# Patient Record
Sex: Female | Born: 1945 | Race: Black or African American | Hispanic: No | Marital: Married | Smoking: Current every day smoker
Health system: Southern US, Community
[De-identification: ages and names within clinical notes are randomized; demographics above are authoritative.]

## PROBLEM LIST (undated history)

## (undated) ENCOUNTER — Ambulatory Visit (HOSPITAL_COMMUNITY): Admission: EM | Payer: Medicare Other

## (undated) DIAGNOSIS — F419 Anxiety disorder, unspecified: Secondary | ICD-10-CM

## (undated) DIAGNOSIS — IMO0001 Reserved for inherently not codable concepts without codable children: Secondary | ICD-10-CM

## (undated) DIAGNOSIS — E119 Type 2 diabetes mellitus without complications: Secondary | ICD-10-CM

## (undated) DIAGNOSIS — R51 Headache: Secondary | ICD-10-CM

## (undated) DIAGNOSIS — K449 Diaphragmatic hernia without obstruction or gangrene: Secondary | ICD-10-CM

## (undated) DIAGNOSIS — Z5189 Encounter for other specified aftercare: Secondary | ICD-10-CM

## (undated) DIAGNOSIS — D649 Anemia, unspecified: Secondary | ICD-10-CM

## (undated) DIAGNOSIS — R519 Headache, unspecified: Secondary | ICD-10-CM

## (undated) DIAGNOSIS — R079 Chest pain, unspecified: Secondary | ICD-10-CM

## (undated) DIAGNOSIS — I2510 Atherosclerotic heart disease of native coronary artery without angina pectoris: Secondary | ICD-10-CM

## (undated) DIAGNOSIS — E7800 Pure hypercholesterolemia, unspecified: Secondary | ICD-10-CM

## (undated) DIAGNOSIS — I214 Non-ST elevation (NSTEMI) myocardial infarction: Secondary | ICD-10-CM

## (undated) DIAGNOSIS — I10 Essential (primary) hypertension: Secondary | ICD-10-CM

## (undated) DIAGNOSIS — M1990 Unspecified osteoarthritis, unspecified site: Secondary | ICD-10-CM

## (undated) DIAGNOSIS — I472 Ventricular tachycardia: Secondary | ICD-10-CM

## (undated) DIAGNOSIS — I201 Angina pectoris with documented spasm: Secondary | ICD-10-CM

## (undated) DIAGNOSIS — K219 Gastro-esophageal reflux disease without esophagitis: Secondary | ICD-10-CM

## (undated) DIAGNOSIS — I209 Angina pectoris, unspecified: Secondary | ICD-10-CM

## (undated) DIAGNOSIS — F410 Panic disorder [episodic paroxysmal anxiety] without agoraphobia: Secondary | ICD-10-CM

## (undated) HISTORY — PX: KNEE SURGERY: SHX244

## (undated) HISTORY — PX: CORONARY ANGIOPLASTY: SHX604

## (undated) HISTORY — PX: ABDOMINAL HYSTERECTOMY: SHX81

## (undated) HISTORY — PX: TOE SURGERY: SHX1073

## (undated) HISTORY — PX: BREAST EXCISIONAL BIOPSY: SUR124

## (undated) HISTORY — PX: OTHER SURGICAL HISTORY: SHX169

## (undated) HISTORY — PX: SHOULDER SURGERY: SHX246

---

## 1998-11-22 ENCOUNTER — Encounter: Payer: Self-pay | Admitting: Emergency Medicine

## 1998-11-22 ENCOUNTER — Inpatient Hospital Stay (HOSPITAL_COMMUNITY): Admission: EM | Admit: 1998-11-22 | Discharge: 1998-11-27 | Payer: Self-pay | Admitting: Emergency Medicine

## 1998-12-06 ENCOUNTER — Observation Stay (HOSPITAL_COMMUNITY): Admission: AD | Admit: 1998-12-06 | Discharge: 1998-12-07 | Payer: Self-pay | Admitting: Interventional Cardiology

## 1998-12-30 ENCOUNTER — Inpatient Hospital Stay (HOSPITAL_COMMUNITY): Admission: EM | Admit: 1998-12-30 | Discharge: 1999-01-02 | Payer: Self-pay | Admitting: Emergency Medicine

## 1998-12-30 ENCOUNTER — Encounter: Payer: Self-pay | Admitting: *Deleted

## 1999-04-03 ENCOUNTER — Inpatient Hospital Stay (HOSPITAL_COMMUNITY): Admission: EM | Admit: 1999-04-03 | Discharge: 1999-04-04 | Payer: Self-pay | Admitting: Emergency Medicine

## 1999-04-03 ENCOUNTER — Encounter: Payer: Self-pay | Admitting: Emergency Medicine

## 1999-05-28 ENCOUNTER — Encounter: Payer: Self-pay | Admitting: Cardiology

## 1999-05-28 ENCOUNTER — Inpatient Hospital Stay (HOSPITAL_COMMUNITY): Admission: EM | Admit: 1999-05-28 | Discharge: 1999-05-29 | Payer: Self-pay | Admitting: Emergency Medicine

## 1999-09-02 ENCOUNTER — Inpatient Hospital Stay (HOSPITAL_COMMUNITY): Admission: EM | Admit: 1999-09-02 | Discharge: 1999-09-04 | Payer: Self-pay | Admitting: Emergency Medicine

## 1999-09-03 ENCOUNTER — Encounter: Payer: Self-pay | Admitting: *Deleted

## 1999-09-24 ENCOUNTER — Ambulatory Visit (HOSPITAL_COMMUNITY): Admission: RE | Admit: 1999-09-24 | Discharge: 1999-09-24 | Payer: Self-pay | Admitting: Gastroenterology

## 2000-01-20 ENCOUNTER — Observation Stay (HOSPITAL_COMMUNITY): Admission: EM | Admit: 2000-01-20 | Discharge: 2000-01-21 | Payer: Self-pay | Admitting: Emergency Medicine

## 2000-01-27 ENCOUNTER — Encounter: Payer: Self-pay | Admitting: *Deleted

## 2000-01-27 ENCOUNTER — Encounter: Admission: RE | Admit: 2000-01-27 | Discharge: 2000-01-27 | Payer: Self-pay | Admitting: *Deleted

## 2000-06-23 ENCOUNTER — Encounter: Payer: Self-pay | Admitting: Emergency Medicine

## 2000-06-23 ENCOUNTER — Inpatient Hospital Stay (HOSPITAL_COMMUNITY): Admission: EM | Admit: 2000-06-23 | Discharge: 2000-06-24 | Payer: Self-pay | Admitting: Emergency Medicine

## 2000-09-17 ENCOUNTER — Ambulatory Visit (HOSPITAL_COMMUNITY): Admission: RE | Admit: 2000-09-17 | Discharge: 2000-09-17 | Payer: Self-pay | Admitting: Gastroenterology

## 2000-09-17 ENCOUNTER — Encounter: Payer: Self-pay | Admitting: Gastroenterology

## 2000-10-01 ENCOUNTER — Encounter: Admission: RE | Admit: 2000-10-01 | Discharge: 2000-10-01 | Payer: Self-pay | Admitting: Internal Medicine

## 2000-10-01 ENCOUNTER — Encounter: Payer: Self-pay | Admitting: Internal Medicine

## 2000-12-11 ENCOUNTER — Ambulatory Visit (HOSPITAL_COMMUNITY): Admission: RE | Admit: 2000-12-11 | Discharge: 2000-12-11 | Payer: Self-pay | Admitting: Gastroenterology

## 2000-12-11 ENCOUNTER — Encounter: Payer: Self-pay | Admitting: Gastroenterology

## 2000-12-15 ENCOUNTER — Encounter: Payer: Self-pay | Admitting: Emergency Medicine

## 2000-12-15 ENCOUNTER — Emergency Department (HOSPITAL_COMMUNITY): Admission: EM | Admit: 2000-12-15 | Discharge: 2000-12-15 | Payer: Self-pay | Admitting: Emergency Medicine

## 2001-01-16 ENCOUNTER — Encounter: Payer: Self-pay | Admitting: Emergency Medicine

## 2001-01-16 ENCOUNTER — Emergency Department (HOSPITAL_COMMUNITY): Admission: EM | Admit: 2001-01-16 | Discharge: 2001-01-16 | Payer: Self-pay | Admitting: Emergency Medicine

## 2001-02-01 ENCOUNTER — Ambulatory Visit (HOSPITAL_COMMUNITY): Admission: RE | Admit: 2001-02-01 | Discharge: 2001-02-01 | Payer: Self-pay | Admitting: Gastroenterology

## 2001-02-03 ENCOUNTER — Inpatient Hospital Stay (HOSPITAL_COMMUNITY): Admission: EM | Admit: 2001-02-03 | Discharge: 2001-02-04 | Payer: Self-pay | Admitting: Emergency Medicine

## 2001-02-03 ENCOUNTER — Encounter: Payer: Self-pay | Admitting: Emergency Medicine

## 2001-02-15 ENCOUNTER — Encounter: Payer: Self-pay | Admitting: Internal Medicine

## 2001-02-15 ENCOUNTER — Encounter: Admission: RE | Admit: 2001-02-15 | Discharge: 2001-02-15 | Payer: Self-pay | Admitting: Internal Medicine

## 2001-04-04 ENCOUNTER — Encounter: Payer: Self-pay | Admitting: Emergency Medicine

## 2001-04-04 ENCOUNTER — Emergency Department (HOSPITAL_COMMUNITY): Admission: EM | Admit: 2001-04-04 | Discharge: 2001-04-04 | Payer: Self-pay | Admitting: Emergency Medicine

## 2001-07-10 ENCOUNTER — Encounter: Payer: Self-pay | Admitting: Orthopedic Surgery

## 2001-07-10 ENCOUNTER — Encounter: Admission: RE | Admit: 2001-07-10 | Discharge: 2001-07-10 | Payer: Self-pay | Admitting: Orthopedic Surgery

## 2002-02-21 ENCOUNTER — Encounter: Payer: Self-pay | Admitting: *Deleted

## 2002-02-21 ENCOUNTER — Emergency Department (HOSPITAL_COMMUNITY): Admission: EM | Admit: 2002-02-21 | Discharge: 2002-02-21 | Payer: Self-pay | Admitting: *Deleted

## 2002-03-01 ENCOUNTER — Encounter: Payer: Self-pay | Admitting: Internal Medicine

## 2002-03-01 ENCOUNTER — Encounter: Admission: RE | Admit: 2002-03-01 | Discharge: 2002-03-01 | Payer: Self-pay | Admitting: Internal Medicine

## 2002-07-02 ENCOUNTER — Inpatient Hospital Stay (HOSPITAL_COMMUNITY): Admission: EM | Admit: 2002-07-02 | Discharge: 2002-07-04 | Payer: Self-pay | Admitting: Emergency Medicine

## 2002-07-02 ENCOUNTER — Encounter: Payer: Self-pay | Admitting: Emergency Medicine

## 2002-11-02 ENCOUNTER — Encounter: Payer: Self-pay | Admitting: Emergency Medicine

## 2002-11-02 ENCOUNTER — Observation Stay (HOSPITAL_COMMUNITY): Admission: EM | Admit: 2002-11-02 | Discharge: 2002-11-03 | Payer: Self-pay | Admitting: Emergency Medicine

## 2002-11-11 ENCOUNTER — Encounter: Payer: Self-pay | Admitting: Emergency Medicine

## 2002-11-11 ENCOUNTER — Emergency Department (HOSPITAL_COMMUNITY): Admission: EM | Admit: 2002-11-11 | Discharge: 2002-11-11 | Payer: Self-pay | Admitting: Emergency Medicine

## 2003-01-11 ENCOUNTER — Ambulatory Visit (HOSPITAL_BASED_OUTPATIENT_CLINIC_OR_DEPARTMENT_OTHER): Admission: RE | Admit: 2003-01-11 | Discharge: 2003-01-11 | Payer: Self-pay | Admitting: Orthopedic Surgery

## 2003-02-08 ENCOUNTER — Ambulatory Visit (HOSPITAL_BASED_OUTPATIENT_CLINIC_OR_DEPARTMENT_OTHER): Admission: RE | Admit: 2003-02-08 | Discharge: 2003-02-08 | Payer: Self-pay | Admitting: Orthopedic Surgery

## 2003-02-19 ENCOUNTER — Emergency Department (HOSPITAL_COMMUNITY): Admission: EM | Admit: 2003-02-19 | Discharge: 2003-02-19 | Payer: Self-pay | Admitting: Emergency Medicine

## 2003-02-19 ENCOUNTER — Encounter: Payer: Self-pay | Admitting: Emergency Medicine

## 2003-02-20 ENCOUNTER — Emergency Department (HOSPITAL_COMMUNITY): Admission: EM | Admit: 2003-02-20 | Discharge: 2003-02-20 | Payer: Self-pay | Admitting: Emergency Medicine

## 2003-03-03 ENCOUNTER — Emergency Department (HOSPITAL_COMMUNITY): Admission: EM | Admit: 2003-03-03 | Discharge: 2003-03-03 | Payer: Self-pay

## 2003-03-16 ENCOUNTER — Inpatient Hospital Stay (HOSPITAL_COMMUNITY): Admission: EM | Admit: 2003-03-16 | Discharge: 2003-03-18 | Payer: Self-pay | Admitting: Emergency Medicine

## 2003-03-16 ENCOUNTER — Encounter: Payer: Self-pay | Admitting: Interventional Cardiology

## 2003-04-05 ENCOUNTER — Encounter: Payer: Self-pay | Admitting: Internal Medicine

## 2003-04-05 ENCOUNTER — Ambulatory Visit (HOSPITAL_COMMUNITY): Admission: RE | Admit: 2003-04-05 | Discharge: 2003-04-05 | Payer: Self-pay | Admitting: Internal Medicine

## 2003-09-19 ENCOUNTER — Encounter: Admission: RE | Admit: 2003-09-19 | Discharge: 2003-09-19 | Payer: Self-pay | Admitting: Internal Medicine

## 2003-10-09 ENCOUNTER — Inpatient Hospital Stay (HOSPITAL_COMMUNITY): Admission: RE | Admit: 2003-10-09 | Discharge: 2003-10-10 | Payer: Self-pay | Admitting: Neurosurgery

## 2004-01-21 ENCOUNTER — Inpatient Hospital Stay (HOSPITAL_COMMUNITY): Admission: EM | Admit: 2004-01-21 | Discharge: 2004-01-24 | Payer: Self-pay | Admitting: Emergency Medicine

## 2004-01-23 ENCOUNTER — Encounter (INDEPENDENT_AMBULATORY_CARE_PROVIDER_SITE_OTHER): Payer: Self-pay | Admitting: Cardiology

## 2004-04-01 ENCOUNTER — Other Ambulatory Visit: Admission: RE | Admit: 2004-04-01 | Discharge: 2004-04-01 | Payer: Self-pay | Admitting: Internal Medicine

## 2004-06-18 ENCOUNTER — Ambulatory Visit (HOSPITAL_COMMUNITY): Admission: RE | Admit: 2004-06-18 | Discharge: 2004-06-18 | Payer: Self-pay | Admitting: Gastroenterology

## 2004-08-10 ENCOUNTER — Emergency Department (HOSPITAL_COMMUNITY): Admission: EM | Admit: 2004-08-10 | Discharge: 2004-08-10 | Payer: Self-pay | Admitting: Emergency Medicine

## 2004-11-14 ENCOUNTER — Encounter: Admission: RE | Admit: 2004-11-14 | Discharge: 2004-11-14 | Payer: Self-pay | Admitting: Internal Medicine

## 2004-12-30 ENCOUNTER — Emergency Department (HOSPITAL_COMMUNITY): Admission: EM | Admit: 2004-12-30 | Discharge: 2004-12-30 | Payer: Self-pay | Admitting: Family Medicine

## 2005-05-24 ENCOUNTER — Emergency Department (HOSPITAL_COMMUNITY): Admission: EM | Admit: 2005-05-24 | Discharge: 2005-05-24 | Payer: Self-pay | Admitting: Emergency Medicine

## 2006-02-18 ENCOUNTER — Inpatient Hospital Stay (HOSPITAL_COMMUNITY): Admission: EM | Admit: 2006-02-18 | Discharge: 2006-02-19 | Payer: Self-pay | Admitting: Emergency Medicine

## 2006-03-19 ENCOUNTER — Encounter: Admission: RE | Admit: 2006-03-19 | Discharge: 2006-03-19 | Payer: Self-pay | Admitting: Internal Medicine

## 2006-03-31 ENCOUNTER — Other Ambulatory Visit: Admission: RE | Admit: 2006-03-31 | Discharge: 2006-03-31 | Payer: Self-pay | Admitting: Internal Medicine

## 2006-11-28 ENCOUNTER — Inpatient Hospital Stay (HOSPITAL_COMMUNITY): Admission: EM | Admit: 2006-11-28 | Discharge: 2006-11-30 | Payer: Self-pay | Admitting: Emergency Medicine

## 2007-03-14 ENCOUNTER — Inpatient Hospital Stay (HOSPITAL_COMMUNITY): Admission: EM | Admit: 2007-03-14 | Discharge: 2007-03-16 | Payer: Self-pay | Admitting: Emergency Medicine

## 2007-04-01 ENCOUNTER — Encounter (HOSPITAL_COMMUNITY): Admission: RE | Admit: 2007-04-01 | Discharge: 2007-06-30 | Payer: Self-pay | Admitting: Interventional Cardiology

## 2007-07-01 ENCOUNTER — Encounter (HOSPITAL_COMMUNITY): Admission: RE | Admit: 2007-07-01 | Discharge: 2007-08-11 | Payer: Self-pay | Admitting: Interventional Cardiology

## 2007-07-02 ENCOUNTER — Encounter: Admission: RE | Admit: 2007-07-02 | Discharge: 2007-07-02 | Payer: Self-pay | Admitting: Internal Medicine

## 2007-08-29 ENCOUNTER — Inpatient Hospital Stay (HOSPITAL_COMMUNITY): Admission: EM | Admit: 2007-08-29 | Discharge: 2007-08-31 | Payer: Self-pay | Admitting: Emergency Medicine

## 2007-10-13 ENCOUNTER — Ambulatory Visit (HOSPITAL_COMMUNITY): Admission: RE | Admit: 2007-10-13 | Discharge: 2007-10-13 | Payer: Self-pay | Admitting: Gastroenterology

## 2007-10-13 ENCOUNTER — Encounter (INDEPENDENT_AMBULATORY_CARE_PROVIDER_SITE_OTHER): Payer: Self-pay | Admitting: Gastroenterology

## 2008-01-03 ENCOUNTER — Emergency Department (HOSPITAL_COMMUNITY): Admission: EM | Admit: 2008-01-03 | Discharge: 2008-01-03 | Payer: Self-pay | Admitting: Emergency Medicine

## 2008-11-18 ENCOUNTER — Inpatient Hospital Stay (HOSPITAL_COMMUNITY): Admission: EM | Admit: 2008-11-18 | Discharge: 2008-11-20 | Payer: Self-pay | Admitting: Emergency Medicine

## 2008-11-18 ENCOUNTER — Ambulatory Visit: Payer: Self-pay | Admitting: *Deleted

## 2008-11-20 ENCOUNTER — Encounter (INDEPENDENT_AMBULATORY_CARE_PROVIDER_SITE_OTHER): Payer: Self-pay | Admitting: Gastroenterology

## 2009-04-18 ENCOUNTER — Observation Stay (HOSPITAL_COMMUNITY): Admission: EM | Admit: 2009-04-18 | Discharge: 2009-04-19 | Payer: Self-pay | Admitting: Emergency Medicine

## 2009-04-25 ENCOUNTER — Encounter: Admission: RE | Admit: 2009-04-25 | Discharge: 2009-04-25 | Payer: Self-pay | Admitting: Internal Medicine

## 2009-07-24 ENCOUNTER — Ambulatory Visit: Payer: Self-pay | Admitting: Cardiology

## 2009-07-24 ENCOUNTER — Inpatient Hospital Stay (HOSPITAL_COMMUNITY): Admission: EM | Admit: 2009-07-24 | Discharge: 2009-07-29 | Payer: Self-pay | Admitting: Emergency Medicine

## 2009-12-06 ENCOUNTER — Emergency Department (HOSPITAL_COMMUNITY): Admission: EM | Admit: 2009-12-06 | Discharge: 2009-12-06 | Payer: Self-pay | Admitting: Emergency Medicine

## 2010-01-23 ENCOUNTER — Inpatient Hospital Stay (HOSPITAL_COMMUNITY): Admission: EM | Admit: 2010-01-23 | Discharge: 2010-01-23 | Payer: Self-pay

## 2010-06-11 ENCOUNTER — Encounter: Admission: RE | Admit: 2010-06-11 | Discharge: 2010-06-11 | Payer: Self-pay | Admitting: Internal Medicine

## 2010-06-17 ENCOUNTER — Encounter: Admission: RE | Admit: 2010-06-17 | Discharge: 2010-06-17 | Payer: Self-pay | Admitting: Internal Medicine

## 2010-06-20 ENCOUNTER — Other Ambulatory Visit: Admission: RE | Admit: 2010-06-20 | Discharge: 2010-06-20 | Payer: Self-pay | Admitting: Internal Medicine

## 2010-06-20 ENCOUNTER — Other Ambulatory Visit
Admission: RE | Admit: 2010-06-20 | Discharge: 2010-06-20 | Payer: Self-pay | Source: Home / Self Care | Admitting: Internal Medicine

## 2010-08-11 HISTORY — PX: OTHER SURGICAL HISTORY: SHX169

## 2010-08-11 HISTORY — PX: CARDIAC CATHETERIZATION: SHX172

## 2010-08-14 ENCOUNTER — Observation Stay (HOSPITAL_COMMUNITY)
Admission: EM | Admit: 2010-08-14 | Discharge: 2010-08-16 | Payer: Self-pay | Source: Home / Self Care | Attending: Interventional Cardiology | Admitting: Interventional Cardiology

## 2010-08-14 LAB — ETHANOL: Alcohol, Ethyl (B): <5 mg/dL (ref 0–10)

## 2010-08-14 LAB — COMPREHENSIVE METABOLIC PANEL
ALT: 13 U/L (ref 0–35)
AST: 16 U/L (ref 0–37)
Albumin: 3.2 g/dL — ABNORMAL LOW (ref 3.5–5.2)
Alkaline Phosphatase: 81 U/L (ref 39–117)
BUN: 7 mg/dL (ref 6–23)
CO2: 28 mEq/L (ref 19–32)
Calcium: 8.8 mg/dL (ref 8.4–10.5)
Chloride: 110 mEq/L (ref 96–112)
Creatinine, Ser: 0.84 mg/dL (ref 0.4–1.2)
GFR calc Af Amer: >60 mL/min (ref >60)
GFR calc non Af Amer: >60 mL/min (ref >60)
Glucose, Bld: 100 mg/dL — ABNORMAL HIGH (ref 70–99)
Potassium: 4.1 mEq/L (ref 3.5–5.1)
Sodium: 144 mEq/L (ref 135–145)
Total Bilirubin: 0.4 mg/dL (ref 0.3–1.2)
Total Protein: 5.8 g/dL — ABNORMAL LOW (ref 6.0–8.3)

## 2010-08-14 LAB — POCT I-STAT, CHEM 8
BUN: 6 mg/dL (ref 6–23)
Calcium, Ion: 1.1 mmol/L — ABNORMAL LOW (ref 1.12–1.32)
Chloride: 106 mEq/L (ref 96–112)
Creatinine, Ser: 1 mg/dL (ref 0.4–1.2)
Glucose, Bld: 103 mg/dL — ABNORMAL HIGH (ref 70–99)
HCT: 40 % (ref 36.0–46.0)
Hemoglobin: 13.6 g/dL (ref 12.0–15.0)
Potassium: 4.1 mEq/L (ref 3.5–5.1)
Sodium: 141 mEq/L (ref 135–145)
TCO2: 29 mmol/L (ref 0–100)

## 2010-08-14 LAB — CBC
HCT: 38.6 % (ref 36.0–46.0)
Hemoglobin: 13 g/dL (ref 12.0–15.0)
MCH: 32.3 pg (ref 26.0–34.0)
MCHC: 33.7 g/dL (ref 30.0–36.0)
MCV: 96 fL (ref 78.0–100.0)
Platelets: 254 10*3/uL (ref 150–400)
RBC: 4.02 MIL/uL (ref 3.87–5.11)
RDW: 14 % (ref 11.5–15.5)
WBC: 5.9 10*3/uL (ref 4.0–10.5)

## 2010-08-14 LAB — POCT CARDIAC MARKERS
CKMB, poc: <1 ng/mL — ABNORMAL LOW (ref 1.0–8.0)
Myoglobin, poc: 40.6 ng/mL (ref 12–200)
Troponin i, poc: 0.13 ng/mL — ABNORMAL HIGH (ref 0.00–0.09)

## 2010-08-14 LAB — APTT: aPTT: 35 seconds (ref 24–37)

## 2010-08-14 LAB — CK TOTAL AND CKMB (NOT AT ARMC)
CK, MB: 1.7 ng/mL (ref 0.3–4.0)
Relative Index: INVALID (ref 0.0–2.5)
Total CK: 91 U/L (ref 7–177)

## 2010-08-14 LAB — PROTIME-INR
INR: 0.92 (ref 0.00–1.49)
Prothrombin Time: 12.6 seconds (ref 11.6–15.2)

## 2010-08-14 LAB — TROPONIN I: Troponin I: 0.02 ng/mL (ref 0.00–0.06)

## 2010-08-14 LAB — LIPASE, BLOOD: Lipase: 20 U/L (ref 11–59)

## 2010-08-15 LAB — LIPID PANEL
Cholesterol: 120 mg/dL (ref 0–200)
HDL: 67 mg/dL (ref >39)
LDL Cholesterol: 41 mg/dL (ref 0–99)
Total CHOL/HDL Ratio: 1.8 RATIO
Triglycerides: 61 mg/dL (ref <150)
VLDL: 12 mg/dL (ref 0–40)

## 2010-08-15 LAB — CARDIAC PANEL(CRET KIN+CKTOT+MB+TROPI)
CK, MB: 1.5 ng/mL (ref 0.3–4.0)
CK, MB: 1.5 ng/mL (ref 0.3–4.0)
Relative Index: INVALID (ref 0.0–2.5)
Relative Index: INVALID (ref 0.0–2.5)
Total CK: 73 U/L (ref 7–177)
Total CK: 71 U/L (ref 7–177)
Troponin I: 0.02 ng/mL (ref 0.00–0.06)
Troponin I: 0.01 ng/mL (ref 0.00–0.06)

## 2010-08-15 LAB — CBC
HCT: 35.4 % — ABNORMAL LOW (ref 36.0–46.0)
Hemoglobin: 11.8 g/dL — ABNORMAL LOW (ref 12.0–15.0)
MCH: 32.4 pg (ref 26.0–34.0)
MCHC: 33.3 g/dL (ref 30.0–36.0)
MCV: 97.3 fL (ref 78.0–100.0)
Platelets: 235 10*3/uL (ref 150–400)
RBC: 3.64 MIL/uL — ABNORMAL LOW (ref 3.87–5.11)
RDW: 14 % (ref 11.5–15.5)
WBC: 5.5 10*3/uL (ref 4.0–10.5)

## 2010-08-15 LAB — HEPATIC FUNCTION PANEL
ALT: 12 U/L (ref 0–35)
AST: 18 U/L (ref 0–37)
Albumin: 3 g/dL — ABNORMAL LOW (ref 3.5–5.2)
Alkaline Phosphatase: 67 U/L (ref 39–117)
Bilirubin, Direct: 0.1 mg/dL (ref 0.0–0.3)
Indirect Bilirubin: 0.1 mg/dL — ABNORMAL LOW (ref 0.3–0.9)
Total Bilirubin: 0.2 mg/dL — ABNORMAL LOW (ref 0.3–1.2)
Total Protein: 5.1 g/dL — ABNORMAL LOW (ref 6.0–8.3)

## 2010-08-15 LAB — DIFFERENTIAL
Basophils Absolute: 0 10*3/uL (ref 0.0–0.1)
Basophils Relative: 0 % (ref 0–1)
Eosinophils Absolute: 0.1 10*3/uL (ref 0.0–0.7)
Eosinophils Relative: 2 % (ref 0–5)
Lymphocytes Relative: 38 % (ref 12–46)
Lymphs Abs: 2.3 10*3/uL (ref 0.7–4.0)
Monocytes Absolute: 0.3 10*3/uL (ref 0.1–1.0)
Monocytes Relative: 5 % (ref 3–12)
Neutro Abs: 3.2 10*3/uL (ref 1.7–7.7)
Neutrophils Relative %: 54 % (ref 43–77)

## 2010-08-15 LAB — AMYLASE: Amylase: 63 U/L (ref 0–105)

## 2010-08-15 LAB — LIPASE, BLOOD: Lipase: 38 U/L (ref 11–59)

## 2010-08-19 ENCOUNTER — Ambulatory Visit (HOSPITAL_COMMUNITY)
Admission: RE | Admit: 2010-08-19 | Discharge: 2010-08-19 | Payer: Self-pay | Source: Home / Self Care | Attending: Gastroenterology | Admitting: Gastroenterology

## 2010-08-31 ENCOUNTER — Encounter: Payer: Self-pay | Admitting: Internal Medicine

## 2010-09-01 ENCOUNTER — Encounter: Payer: Self-pay | Admitting: Interventional Cardiology

## 2010-09-06 ENCOUNTER — Other Ambulatory Visit: Payer: Self-pay | Admitting: Gastroenterology

## 2010-10-28 LAB — DIFFERENTIAL
Basophils Absolute: 0 10*3/uL (ref 0.0–0.1)
Basophils Relative: 0 % (ref 0–1)
Eosinophils Absolute: 0.1 10*3/uL (ref 0.0–0.7)
Eosinophils Relative: 1 % (ref 0–5)
Lymphocytes Relative: 33 % (ref 12–46)
Lymphs Abs: 3.1 10*3/uL (ref 0.7–4.0)
Monocytes Absolute: 0.4 10*3/uL (ref 0.1–1.0)
Monocytes Relative: 5 % (ref 3–12)
Neutro Abs: 5.7 10*3/uL (ref 1.7–7.7)
Neutrophils Relative %: 62 % (ref 43–77)

## 2010-10-28 LAB — APTT: aPTT: 32 seconds (ref 24–37)

## 2010-10-28 LAB — POCT I-STAT 3, VENOUS BLOOD GAS (G3P V)
Acid-Base Excess: 1 mmol/L (ref 0.0–2.0)
Bicarbonate: 24.6 mEq/L — ABNORMAL HIGH (ref 20.0–24.0)
O2 Saturation: 99 %
TCO2: 26 mmol/L (ref 0–100)
pCO2, Ven: 37.3 mmHg — ABNORMAL LOW (ref 45.0–50.0)
pH, Ven: 7.428 — ABNORMAL HIGH (ref 7.250–7.300)
pO2, Ven: 125 mmHg — ABNORMAL HIGH (ref 30.0–45.0)

## 2010-10-28 LAB — POCT I-STAT 3, ART BLOOD GAS (G3+)
Acid-base deficit: 4 mmol/L — ABNORMAL HIGH (ref 0.0–2.0)
Bicarbonate: 22.9 mEq/L (ref 20.0–24.0)
O2 Saturation: 100 %
TCO2: 24 mmol/L (ref 0–100)
pCO2 arterial: 48 mmHg — ABNORMAL HIGH (ref 35.0–45.0)
pH, Arterial: 7.286 — ABNORMAL LOW (ref 7.350–7.400)
pO2, Arterial: 520 mmHg — ABNORMAL HIGH (ref 80.0–100.0)

## 2010-10-28 LAB — URINALYSIS, ROUTINE W REFLEX MICROSCOPIC
Bilirubin Urine: NEGATIVE
Glucose, UA: NEGATIVE mg/dL
Hgb urine dipstick: NEGATIVE
Ketones, ur: NEGATIVE mg/dL
Nitrite: NEGATIVE
Protein, ur: NEGATIVE mg/dL
Specific Gravity, Urine: 1.01 (ref 1.005–1.030)
Urobilinogen, UA: 1 mg/dL (ref 0.0–1.0)
pH: 5 (ref 5.0–8.0)

## 2010-10-28 LAB — BRAIN NATRIURETIC PEPTIDE: Pro B Natriuretic peptide (BNP): 39 pg/mL (ref 0.0–100.0)

## 2010-10-28 LAB — COMPREHENSIVE METABOLIC PANEL
ALT: 23 U/L (ref 0–35)
AST: 26 U/L (ref 0–37)
Albumin: 4.2 g/dL (ref 3.5–5.2)
Alkaline Phosphatase: 122 U/L — ABNORMAL HIGH (ref 39–117)
BUN: 15 mg/dL (ref 6–23)
CO2: 23 mEq/L (ref 19–32)
Calcium: 10 mg/dL (ref 8.4–10.5)
Chloride: 106 mEq/L (ref 96–112)
Creatinine, Ser: 0.81 mg/dL (ref 0.4–1.2)
GFR calc Af Amer: >60 mL/min (ref >60)
GFR calc non Af Amer: >60 mL/min (ref >60)
Glucose, Bld: 106 mg/dL — ABNORMAL HIGH (ref 70–99)
Potassium: 3.7 mEq/L (ref 3.5–5.1)
Sodium: 142 mEq/L (ref 135–145)
Total Bilirubin: 0.2 mg/dL — ABNORMAL LOW (ref 0.3–1.2)
Total Protein: 7.5 g/dL (ref 6.0–8.3)

## 2010-10-28 LAB — URINE CULTURE
Colony Count: NO GROWTH
Culture: NO GROWTH

## 2010-10-28 LAB — RAPID URINE DRUG SCREEN, HOSP PERFORMED
Amphetamines: NOT DETECTED
Barbiturates: NOT DETECTED
Benzodiazepines: NOT DETECTED
Cocaine: NOT DETECTED
Opiates: NOT DETECTED
Tetrahydrocannabinol: NOT DETECTED

## 2010-10-28 LAB — CBC
HCT: 42.6 % (ref 36.0–46.0)
Hemoglobin: 14.2 g/dL (ref 12.0–15.0)
MCHC: 33.3 g/dL (ref 30.0–36.0)
MCV: 92.6 fL (ref 78.0–100.0)
Platelets: 315 10*3/uL (ref 150–400)
RBC: 4.6 MIL/uL (ref 3.87–5.11)
RDW: 21.9 % — ABNORMAL HIGH (ref 11.5–15.5)
WBC: 9.3 10*3/uL (ref 4.0–10.5)

## 2010-10-28 LAB — POCT I-STAT, CHEM 8
BUN: 16 mg/dL (ref 6–23)
Calcium, Ion: 1.11 mmol/L — ABNORMAL LOW (ref 1.12–1.32)
Chloride: 107 mEq/L (ref 96–112)
Creatinine, Ser: 0.9 mg/dL (ref 0.4–1.2)
Glucose, Bld: 109 mg/dL — ABNORMAL HIGH (ref 70–99)
HCT: 47 % — ABNORMAL HIGH (ref 36.0–46.0)
Hemoglobin: 16 g/dL — ABNORMAL HIGH (ref 12.0–15.0)
Potassium: 3.6 mEq/L (ref 3.5–5.1)
Sodium: 141 mEq/L (ref 135–145)
TCO2: 22 mmol/L (ref 0–100)

## 2010-10-28 LAB — CK TOTAL AND CKMB (NOT AT ARMC)
CK, MB: 2.6 ng/mL (ref 0.3–4.0)
Relative Index: INVALID (ref 0.0–2.5)
Total CK: 79 U/L (ref 7–177)

## 2010-10-28 LAB — PROTIME-INR
INR: 0.96 (ref 0.00–1.49)
Prothrombin Time: 12.7 seconds (ref 11.6–15.2)

## 2010-10-28 LAB — POCT CARDIAC MARKERS
CKMB, poc: 2.5 ng/mL (ref 1.0–8.0)
Myoglobin, poc: 70.6 ng/mL (ref 12–200)
Troponin i, poc: <0.05 ng/mL (ref 0.00–0.09)

## 2010-10-28 LAB — CARDIAC PANEL(CRET KIN+CKTOT+MB+TROPI)
CK, MB: 2.6 ng/mL (ref 0.3–4.0)
Relative Index: INVALID (ref 0.0–2.5)
Total CK: 95 U/L (ref 7–177)
Troponin I: 0.01 ng/mL (ref 0.00–0.06)

## 2010-10-28 LAB — TSH: TSH: 0.298 u[IU]/mL — ABNORMAL LOW (ref 0.350–4.500)

## 2010-10-28 LAB — TROPONIN I: Troponin I: 0.01 ng/mL (ref 0.00–0.06)

## 2010-10-28 LAB — D-DIMER, QUANTITATIVE (NOT AT ARMC): D-Dimer, Quant: <0.22 ug/mL-FEU (ref 0.00–0.48)

## 2010-10-28 LAB — ETHANOL: Alcohol, Ethyl (B): 170 mg/dL — ABNORMAL HIGH (ref 0–10)

## 2010-10-28 LAB — LIPASE, BLOOD: Lipase: 57 U/L (ref 11–59)

## 2010-11-11 LAB — BASIC METABOLIC PANEL
BUN: 7 mg/dL (ref 6–23)
CO2: 31 mEq/L (ref 19–32)
Calcium: 8.8 mg/dL (ref 8.4–10.5)
Chloride: 102 mEq/L (ref 96–112)
Creatinine, Ser: 0.76 mg/dL (ref 0.4–1.2)
GFR calc Af Amer: >60 mL/min (ref >60)
GFR calc non Af Amer: >60 mL/min (ref >60)
Glucose, Bld: 128 mg/dL — ABNORMAL HIGH (ref 70–99)
Potassium: 4.1 mEq/L (ref 3.5–5.1)
Sodium: 139 mEq/L (ref 135–145)

## 2010-11-11 LAB — TROPONIN I: Troponin I: 0.02 ng/mL (ref 0.00–0.06)

## 2010-11-11 LAB — CBC
HCT: 35.2 % — ABNORMAL LOW (ref 36.0–46.0)
HCT: 36.8 % (ref 36.0–46.0)
Hemoglobin: 12.1 g/dL (ref 12.0–15.0)
Hemoglobin: 12.3 g/dL (ref 12.0–15.0)
MCHC: 33.4 g/dL (ref 30.0–36.0)
MCHC: 34.4 g/dL (ref 30.0–36.0)
MCV: 91.1 fL (ref 78.0–100.0)
MCV: 92 fL (ref 78.0–100.0)
Platelets: 247 10*3/uL (ref 150–400)
Platelets: 262 10*3/uL (ref 150–400)
RBC: 3.87 MIL/uL (ref 3.87–5.11)
RBC: 4 MIL/uL (ref 3.87–5.11)
RDW: 16.5 % — ABNORMAL HIGH (ref 11.5–15.5)
RDW: 16.7 % — ABNORMAL HIGH (ref 11.5–15.5)
WBC: 5.6 10*3/uL (ref 4.0–10.5)
WBC: 7.1 10*3/uL (ref 4.0–10.5)

## 2010-11-12 LAB — CK TOTAL AND CKMB (NOT AT ARMC)
CK, MB: 1.4 ng/mL (ref 0.3–4.0)
Relative Index: INVALID (ref 0.0–2.5)
Total CK: 98 U/L (ref 7–177)

## 2010-11-12 LAB — COMPREHENSIVE METABOLIC PANEL
ALT: 13 U/L (ref 0–35)
AST: 17 U/L (ref 0–37)
Albumin: 3.3 g/dL — ABNORMAL LOW (ref 3.5–5.2)
Alkaline Phosphatase: 104 U/L (ref 39–117)
BUN: 14 mg/dL (ref 6–23)
CO2: 26 mEq/L (ref 19–32)
Calcium: 9 mg/dL (ref 8.4–10.5)
Chloride: 108 mEq/L (ref 96–112)
Creatinine, Ser: 0.62 mg/dL (ref 0.4–1.2)
GFR calc Af Amer: >60 mL/min (ref >60)
GFR calc non Af Amer: >60 mL/min (ref >60)
Glucose, Bld: 100 mg/dL — ABNORMAL HIGH (ref 70–99)
Potassium: 3.6 mEq/L (ref 3.5–5.1)
Sodium: 142 mEq/L (ref 135–145)
Total Bilirubin: 0.2 mg/dL — ABNORMAL LOW (ref 0.3–1.2)
Total Protein: 6.3 g/dL (ref 6.0–8.3)

## 2010-11-12 LAB — LIPID PANEL
Cholesterol: 123 mg/dL (ref 0–200)
HDL: 66 mg/dL (ref >39)
LDL Cholesterol: 48 mg/dL (ref 0–99)
Total CHOL/HDL Ratio: 1.9 RATIO
Triglycerides: 47 mg/dL (ref <150)
VLDL: 9 mg/dL (ref 0–40)

## 2010-11-12 LAB — DIFFERENTIAL
Basophils Absolute: 0.1 10*3/uL (ref 0.0–0.1)
Basophils Relative: 1 % (ref 0–1)
Eosinophils Absolute: 0.1 10*3/uL (ref 0.0–0.7)
Eosinophils Relative: 2 % (ref 0–5)
Lymphocytes Relative: 42 % (ref 12–46)
Lymphs Abs: 2.6 10*3/uL (ref 0.7–4.0)
Monocytes Absolute: 0.4 10*3/uL (ref 0.1–1.0)
Monocytes Relative: 7 % (ref 3–12)
Neutro Abs: 2.9 10*3/uL (ref 1.7–7.7)
Neutrophils Relative %: 48 % (ref 43–77)

## 2010-11-12 LAB — HEPARIN LEVEL (UNFRACTIONATED): Heparin Unfractionated: 0.26 IU/mL — ABNORMAL LOW (ref 0.30–0.70)

## 2010-11-12 LAB — CBC
HCT: 34.5 % — ABNORMAL LOW (ref 36.0–46.0)
HCT: 38.2 % (ref 36.0–46.0)
Hemoglobin: 11.7 g/dL — ABNORMAL LOW (ref 12.0–15.0)
Hemoglobin: 13 g/dL (ref 12.0–15.0)
MCHC: 34 g/dL (ref 30.0–36.0)
MCHC: 34.1 g/dL (ref 30.0–36.0)
MCV: 91.3 fL (ref 78.0–100.0)
MCV: 92.5 fL (ref 78.0–100.0)
Platelets: 241 10*3/uL (ref 150–400)
Platelets: 258 10*3/uL (ref 150–400)
RBC: 3.73 MIL/uL — ABNORMAL LOW (ref 3.87–5.11)
RBC: 4.19 MIL/uL (ref 3.87–5.11)
RDW: 16.5 % — ABNORMAL HIGH (ref 11.5–15.5)
RDW: 16.9 % — ABNORMAL HIGH (ref 11.5–15.5)
WBC: 6.1 10*3/uL (ref 4.0–10.5)
WBC: 6.8 10*3/uL (ref 4.0–10.5)

## 2010-11-12 LAB — TSH: TSH: 1.287 u[IU]/mL (ref 0.350–4.500)

## 2010-11-12 LAB — POCT CARDIAC MARKERS
CKMB, poc: 1.2 ng/mL (ref 1.0–8.0)
Myoglobin, poc: 36.8 ng/mL (ref 12–200)
Troponin i, poc: <0.05 ng/mL (ref 0.00–0.09)

## 2010-11-12 LAB — TROPONIN I: Troponin I: <0.01 ng/mL (ref 0.00–0.06)

## 2010-11-12 LAB — CARDIAC PANEL(CRET KIN+CKTOT+MB+TROPI)
CK, MB: 1.4 ng/mL (ref 0.3–4.0)
CK, MB: 1.6 ng/mL (ref 0.3–4.0)
Relative Index: 1.4 (ref 0.0–2.5)
Relative Index: INVALID (ref 0.0–2.5)
Total CK: 101 U/L (ref 7–177)
Total CK: 92 U/L (ref 7–177)
Troponin I: 0.02 ng/mL (ref 0.00–0.06)
Troponin I: <0.01 ng/mL (ref 0.00–0.06)

## 2010-11-12 LAB — PROTIME-INR
INR: 0.95 (ref 0.00–1.49)
Prothrombin Time: 12.6 seconds (ref 11.6–15.2)

## 2010-11-12 LAB — LIPASE, BLOOD: Lipase: 33 U/L (ref 11–59)

## 2010-11-12 NOTE — Consult Note (Signed)
  NAMEABAIGEAL, MOOMAW NO.:  [id number]  MEDICAL RECORD NO.:  [id number]          PATIENT TYPE:  INP  LOCATION:  2040                         FACILITY:  [facility]  PHYSICIAN:  Graylin Shiver, M.D.   DATE OF BIRTH:  [birth year]  DATE OF CONSULTATION:  08/16/2010 DATE OF DISCHARGE:  08/16/2010                                CONSULTATION   REASON FOR CONSULTATION:  The patient is a 65 year old black female, who was admitted to the hospital 2 days ago with complaints of chest pain. She has a history of coronary artery disease.  She states that the pain comes on both after eating but also not after eating.  She describes the pain as a sharp pain, which lasts for about 15 minutes.  Pain will radiate to her back.  The cardiologists do not feel that this is definitely cardiac, but she is going to have a stress test.  They wondered if this pain might be GI.  The patient had an EGD by Dr. Ewing Schlein in April 2010, which showed deep distal greater curvature erosions and one linear ulcer and minimal antral gastritis.  The patient had an abdominal ultrasound done here in the hospital, which did not show gallstones, but did show incomplete gallbladder distention and mild diffuse thickening of the gallbladder wall.  The patient states that when the ultrasonographer rubbed the probe over the right upper quadrant, it did cause her some pain.  PAST MEDICAL HISTORY: 1. Coronary artery disease. 2. Hypertension. 3. Hiatal hernia. 4. Hyperlipidemia. 5. Anxiety. 6. GERD. 7. Diabetes. 8. She had a colonoscopy in 2009 by Dr. Danise Edge, which showed 3     diminutive polyps and moderate-sized internal hemorrhoids.  MEDICATIONS:  Noted on her H and P.  PHYSICAL EXAMINATION:  GENERAL:  She is in no acute distress, nonicteric. HEART:  Regular rhythm.  No murmurs. LUNGS:  Clear. ABDOMEN:  Soft, nontender.  No hepatosplenomegaly.  IMPRESSION:  Chest pain of uncertain etiology.  It is  unclear whether this is cardiac versus gastrointestinal.  The patient is going to have a stress test done.  Her lipase is normal.  I would recommend doing a HIDA scan with ejection fraction to look for biliary dyskinesia.  I would also recommend continuing her on the Protonix that she is on right now in the hospital.  We will have to see what the stress test shows to determine whether we need to proceed with further gastrointestinal workup versus proceed with further cardiology workup.          ______________________________ Graylin Shiver, M.D.     SFG/MEDQ  D:  08/16/2010  T:  08/17/2010  Job:  [id number]  cc:   Georgann Housekeeper, MD Lyn Records, M.D. Petra Kuba, M.D. Shirley Friar, MD  Electronically Signed by Herbert Moors MD on 09/24/2010 12:47:15 PM

## 2010-11-15 LAB — LIPASE, BLOOD: Lipase: 21 U/L (ref 11–59)

## 2010-11-15 LAB — DIFFERENTIAL
Basophils Absolute: 0 10*3/uL (ref 0.0–0.1)
Basophils Relative: 0 % (ref 0–1)
Eosinophils Absolute: 0.1 10*3/uL (ref 0.0–0.7)
Eosinophils Relative: 1 % (ref 0–5)
Lymphocytes Relative: 24 % (ref 12–46)
Lymphs Abs: 1.8 10*3/uL (ref 0.7–4.0)
Monocytes Absolute: 0.5 10*3/uL (ref 0.1–1.0)
Monocytes Relative: 7 % (ref 3–12)
Neutro Abs: 5.2 10*3/uL (ref 1.7–7.7)
Neutrophils Relative %: 68 % (ref 43–77)

## 2010-11-15 LAB — CARDIAC PANEL(CRET KIN+CKTOT+MB+TROPI)
CK, MB: 2 ng/mL (ref 0.3–4.0)
CK, MB: 2.2 ng/mL (ref 0.3–4.0)
Relative Index: 1.9 (ref 0.0–2.5)
Relative Index: 1.9 (ref 0.0–2.5)
Total CK: 104 U/L (ref 7–177)
Total CK: 113 U/L (ref 7–177)
Troponin I: 0.01 ng/mL (ref 0.00–0.06)
Troponin I: 0.02 ng/mL (ref 0.00–0.06)

## 2010-11-15 LAB — HEPATIC FUNCTION PANEL
ALT: 15 U/L (ref 0–35)
AST: 20 U/L (ref 0–37)
Albumin: 3.3 g/dL — ABNORMAL LOW (ref 3.5–5.2)
Alkaline Phosphatase: 106 U/L (ref 39–117)
Bilirubin, Direct: 0.1 mg/dL (ref 0.0–0.3)
Indirect Bilirubin: 0.3 mg/dL (ref 0.3–0.9)
Total Bilirubin: 0.4 mg/dL (ref 0.3–1.2)
Total Protein: 6 g/dL (ref 6.0–8.3)

## 2010-11-15 LAB — GLUCOSE, CAPILLARY
Glucose-Capillary: 115 mg/dL — ABNORMAL HIGH (ref 70–99)
Glucose-Capillary: 129 mg/dL — ABNORMAL HIGH (ref 70–99)

## 2010-11-15 LAB — URINE CULTURE
Colony Count: NO GROWTH
Culture: NO GROWTH

## 2010-11-15 LAB — D-DIMER, QUANTITATIVE (NOT AT ARMC)
D-Dimer, Quant: 0.39 ug/mL-FEU (ref 0.00–0.48)
D-Dimer, Quant: 0.47 ug/mL-FEU (ref 0.00–0.48)

## 2010-11-15 LAB — TROPONIN I: Troponin I: 0.02 ng/mL (ref 0.00–0.06)

## 2010-11-15 LAB — CK TOTAL AND CKMB (NOT AT ARMC)
CK, MB: 2.1 ng/mL (ref 0.3–4.0)
Relative Index: 1.8 (ref 0.0–2.5)
Total CK: 120 U/L (ref 7–177)

## 2010-11-15 LAB — POCT CARDIAC MARKERS
CKMB, poc: 1.4 ng/mL (ref 1.0–8.0)
CKMB, poc: 2.1 ng/mL (ref 1.0–8.0)
Myoglobin, poc: 50.6 ng/mL (ref 12–200)
Myoglobin, poc: 55.3 ng/mL (ref 12–200)
Troponin i, poc: <0.05 ng/mL (ref 0.00–0.09)
Troponin i, poc: <0.05 ng/mL (ref 0.00–0.09)

## 2010-11-15 LAB — CBC
HCT: 39.6 % (ref 36.0–46.0)
Hemoglobin: 13.3 g/dL (ref 12.0–15.0)
MCHC: 33.5 g/dL (ref 30.0–36.0)
MCV: 92.9 fL (ref 78.0–100.0)
Platelets: 285 10*3/uL (ref 150–400)
RBC: 4.26 MIL/uL (ref 3.87–5.11)
RDW: 16 % — ABNORMAL HIGH (ref 11.5–15.5)
WBC: 7.7 10*3/uL (ref 4.0–10.5)

## 2010-11-15 LAB — URINALYSIS, ROUTINE W REFLEX MICROSCOPIC
Bilirubin Urine: NEGATIVE
Glucose, UA: NEGATIVE mg/dL
Hgb urine dipstick: NEGATIVE
Ketones, ur: NEGATIVE mg/dL
Nitrite: NEGATIVE
Protein, ur: NEGATIVE mg/dL
Specific Gravity, Urine: 1.013 (ref 1.005–1.030)
Urobilinogen, UA: 0.2 mg/dL (ref 0.0–1.0)
pH: 7.5 (ref 5.0–8.0)

## 2010-11-15 LAB — BASIC METABOLIC PANEL
BUN: 7 mg/dL (ref 6–23)
CO2: 28 mEq/L (ref 19–32)
Calcium: 9.4 mg/dL (ref 8.4–10.5)
Chloride: 105 mEq/L (ref 96–112)
Creatinine, Ser: 0.57 mg/dL (ref 0.4–1.2)
GFR calc Af Amer: >60 mL/min (ref >60)
GFR calc non Af Amer: >60 mL/min (ref >60)
Glucose, Bld: 107 mg/dL — ABNORMAL HIGH (ref 70–99)
Potassium: 3.9 mEq/L (ref 3.5–5.1)
Sodium: 140 mEq/L (ref 135–145)

## 2010-11-15 LAB — TYPE AND SCREEN
ABO/RH(D): O POS
Antibody Screen: NEGATIVE

## 2010-11-15 LAB — HEMOGLOBIN A1C
Hgb A1c MFr Bld: 5.8 % (ref 4.6–6.1)
Mean Plasma Glucose: 120 mg/dL

## 2010-11-15 LAB — LIPID PANEL
Cholesterol: 181 mg/dL (ref 0–200)
HDL: 94 mg/dL (ref >39)
LDL Cholesterol: 73 mg/dL (ref 0–99)
Total CHOL/HDL Ratio: 1.9 RATIO
Triglycerides: 69 mg/dL (ref <150)
VLDL: 14 mg/dL (ref 0–40)

## 2010-11-20 LAB — CROSSMATCH
ABO/RH(D): O POS
Antibody Screen: NEGATIVE

## 2010-11-20 LAB — FERRITIN: Ferritin: 4 ng/mL — ABNORMAL LOW (ref 10–291)

## 2010-11-20 LAB — CBC
HCT: 23.9 % — ABNORMAL LOW (ref 36.0–46.0)
HCT: 30.8 % — ABNORMAL LOW (ref 36.0–46.0)
HCT: 32.7 % — ABNORMAL LOW (ref 36.0–46.0)
Hemoglobin: 10 g/dL — ABNORMAL LOW (ref 12.0–15.0)
Hemoglobin: 10.6 g/dL — ABNORMAL LOW (ref 12.0–15.0)
Hemoglobin: 7.3 g/dL — CL (ref 12.0–15.0)
MCHC: 30.6 g/dL (ref 30.0–36.0)
MCHC: 32.3 g/dL (ref 30.0–36.0)
MCHC: 32.5 g/dL (ref 30.0–36.0)
MCV: 67.9 fL — ABNORMAL LOW (ref 78.0–100.0)
MCV: 74.6 fL — ABNORMAL LOW (ref 78.0–100.0)
MCV: 74.9 fL — ABNORMAL LOW (ref 78.0–100.0)
Platelets: 271 10*3/uL (ref 150–400)
Platelets: 304 10*3/uL (ref 150–400)
Platelets: 346 10*3/uL (ref 150–400)
RBC: 3.52 MIL/uL — ABNORMAL LOW (ref 3.87–5.11)
RBC: 4.11 MIL/uL (ref 3.87–5.11)
RBC: 4.39 MIL/uL (ref 3.87–5.11)
RDW: 22.2 % — ABNORMAL HIGH (ref 11.5–15.5)
RDW: 27 % — ABNORMAL HIGH (ref 11.5–15.5)
RDW: 27 % — ABNORMAL HIGH (ref 11.5–15.5)
WBC: 6.2 10*3/uL (ref 4.0–10.5)
WBC: 6.3 10*3/uL (ref 4.0–10.5)
WBC: 6.8 10*3/uL (ref 4.0–10.5)

## 2010-11-20 LAB — IRON AND TIBC
Iron: 12 ug/dL — ABNORMAL LOW (ref 42–135)
Saturation Ratios: 3 % — ABNORMAL LOW (ref 20–55)
TIBC: 383 ug/dL (ref 250–470)
UIBC: 371 ug/dL

## 2010-11-20 LAB — BASIC METABOLIC PANEL
BUN: 11 mg/dL (ref 6–23)
BUN: 7 mg/dL (ref 6–23)
CO2: 25 mEq/L (ref 19–32)
CO2: 27 mEq/L (ref 19–32)
Calcium: 8.1 mg/dL — ABNORMAL LOW (ref 8.4–10.5)
Calcium: 9 mg/dL (ref 8.4–10.5)
Chloride: 106 mEq/L (ref 96–112)
Chloride: 107 mEq/L (ref 96–112)
Creatinine, Ser: 0.62 mg/dL (ref 0.4–1.2)
Creatinine, Ser: 0.67 mg/dL (ref 0.4–1.2)
GFR calc Af Amer: >60 mL/min (ref >60)
GFR calc Af Amer: >60 mL/min (ref >60)
GFR calc non Af Amer: >60 mL/min (ref >60)
GFR calc non Af Amer: >60 mL/min (ref >60)
Glucose, Bld: 106 mg/dL — ABNORMAL HIGH (ref 70–99)
Glucose, Bld: 118 mg/dL — ABNORMAL HIGH (ref 70–99)
Potassium: 3.8 mEq/L (ref 3.5–5.1)
Potassium: 3.9 mEq/L (ref 3.5–5.1)
Sodium: 136 mEq/L (ref 135–145)
Sodium: 141 mEq/L (ref 135–145)

## 2010-11-20 LAB — CK TOTAL AND CKMB (NOT AT ARMC)
CK, MB: 1.2 ng/mL (ref 0.3–4.0)
CK, MB: 1.3 ng/mL (ref 0.3–4.0)
Relative Index: 1 (ref 0.0–2.5)
Relative Index: INVALID (ref 0.0–2.5)
Total CK: 133 U/L (ref 7–177)
Total CK: 97 U/L (ref 7–177)

## 2010-11-20 LAB — CARDIAC PANEL(CRET KIN+CKTOT+MB+TROPI)
CK, MB: 1.4 ng/mL (ref 0.3–4.0)
CK, MB: 1.4 ng/mL (ref 0.3–4.0)
Relative Index: 1.2 (ref 0.0–2.5)
Relative Index: 1.3 (ref 0.0–2.5)
Total CK: 111 U/L (ref 7–177)
Total CK: 117 U/L (ref 7–177)
Troponin I: <0.01 ng/mL (ref 0.00–0.06)
Troponin I: <0.01 ng/mL (ref 0.00–0.06)

## 2010-11-20 LAB — FOLATE: Folate: 9 ng/mL

## 2010-11-20 LAB — POCT I-STAT, CHEM 8
BUN: 11 mg/dL (ref 6–23)
Calcium, Ion: 1.14 mmol/L (ref 1.12–1.32)
Chloride: 114 mEq/L — ABNORMAL HIGH (ref 96–112)
Creatinine, Ser: 0.9 mg/dL (ref 0.4–1.2)
Glucose, Bld: 105 mg/dL — ABNORMAL HIGH (ref 70–99)
HCT: 27 % — ABNORMAL LOW (ref 36.0–46.0)
Hemoglobin: 9.2 g/dL — ABNORMAL LOW (ref 12.0–15.0)
Potassium: 3.8 mEq/L (ref 3.5–5.1)
Sodium: 137 mEq/L (ref 135–145)
TCO2: 29 mmol/L (ref 0–100)

## 2010-11-20 LAB — PROTIME-INR
INR: 1 (ref 0.00–1.49)
Prothrombin Time: 13.6 seconds (ref 11.6–15.2)

## 2010-11-20 LAB — ABO/RH: ABO/RH(D): O POS

## 2010-11-20 LAB — VITAMIN B12: Vitamin B-12: 607 pg/mL (ref 211–911)

## 2010-11-20 LAB — TROPONIN I: Troponin I: <0.01 ng/mL (ref 0.00–0.06)

## 2010-11-20 LAB — POCT CARDIAC MARKERS
CKMB, poc: <1 ng/mL — ABNORMAL LOW (ref 1.0–8.0)
Myoglobin, poc: 41 ng/mL (ref 12–200)
Troponin i, poc: <0.05 ng/mL (ref 0.00–0.09)

## 2010-11-20 LAB — PREPARE RBC (CROSSMATCH)

## 2010-11-20 LAB — APTT: aPTT: 31 seconds (ref 24–37)

## 2010-12-24 NOTE — H&P (Signed)
NAME:  [name], [name] NO.:  [id number]   MEDICAL RECORD NO.:  [id number]          PATIENT TYPE:  EMS   LOCATION:  MAJO                         FACILITY:  [facility]   PHYSICIAN:  Unice Cobble, MD     DATE OF BIRTH:  May 22, 1946   DATE OF ADMISSION:  11/18/2008  DATE OF DISCHARGE:                              HISTORY & PHYSICAL   CARDIOLOGIST:  Lyn Records, M.D.   CHIEF COMPLAINT:  Chest pain.   HISTORY OF PRESENT ILLNESS:  This is a 65 year old African American  female with a history of coronary disease and coronary artery spasm  documented by cardiac catheterization who presents with chest pain  starting 3 p.m..  The patient had chest tightness radiating to her right  arm and fingers associated with presyncope, sweatiness, and shortness of  breath.  She also complains of tingling in her right fingers and both  legs.  It appears that the tingling has been going on for 2 to 3 weeks,  but the chest pain today was new.  She tells me that the symptoms are  similar to her last admission when she had documented coronary spasm by  catheterization.  Chest pain was 9/10 at its worst and it is still 9/10  after three sublingual nitroglycerin, nitroglycerin drip, and 6 mg of  morphine.  She has no signs and symptoms of heart failure at this time.   PAST MEDICAL HISTORY:  1. Coronary disease, status post PCI to middle RCA.  2. History of intracoronary spasm documented by coronary      catheterization August 2008.  3. History of bilateral rotator cuff repairs.  4. Partial hysterectomy.  5. Left ear surgery.  6. GERD.  7. Hyperlipidemia.  8. Catheterization August 2008 showed RCA spasm, patent stent in the      RCA, ejection fraction of 55%, and a small inferior wall motion      abnormality.   ALLERGIES:  NO KNOWN DRUG ALLERGIES.   MEDICATIONS:  The patient is unsure of her medications.  She brought in  a list but this has been misplaced by the triage nurse.  The  following  list is my best guess what she is taking based on past notes and what  the patient can remember.  1. Aspirin 325 mg daily.  2. Norvasc 5 mg daily.  3. Lexapro 20 mg daily.  4. Gabapentin 400 mg daily.  5. Altace 2.5 mg daily.  6. Klonopin b.i.d. p.r.n.  7. Prevacid  8. Simvastatin 20 mg daily.  9. Isosorbide mononitrate, unknown dose.   SOCIAL HISTORY:  She lives with her spouse.  She has three children.  She is disabled from heart problems and reflex.  History of smoking.  Occasional beer.   FAMILY HISTORY:  Her mother is living with heart issues.  Father died of  unknown causes.   REVIEW OF SYSTEMS:  Complete review of systems done found to be  otherwise negative except as stated in the HPI.   PHYSICAL EXAMINATION:  VITAL SIGNS:  Temperature 97.1, pulse 60,  respiratory rate 19, blood pressure 107/59, oxygen  saturation 100% on 2  liters.  GENERAL:  She is in no acute distress.  HEENT:  PERRLA, EOMI, and, oropharynx without erythema or exudates.  NECK:  Supple without lymphadenopathy, thyromegaly, bruits or jugular  venous distention.  HEART:  Regular rate and rhythm with a normal S1 and S2.  She has a 2/6  systolic murmur radiating to her axilla.  LUNGS:  Clear to auscultation bilaterally.  ABDOMEN:  Soft and nontender with normal bowel sounds.  No rebound or  guarding.  EXTREMITIES:  No cyanosis, clubbing or edema.  MUSCULOSKELETAL:  No joint deformity or effusions.  No spine or CVA  tenderness.  NEUROLOGICALLY:  She is alert and oriented x3 with cranial nerves II-XII  grossly intact.  Strength is 5/5 all extremities and axial groups.   LABORATORY:  Radiology review:  Chest x-ray shows no acute  cardiopulmonary process.  EKG shows a rate of 62 and normal sinus  rhythm.  Lateral T-wave inversions and flattening which are not changed  from prior EKGs.  Her labs show a hemoglobin of 9.2 and a creatinine of  0.9.  His CK-MB and troponin are negative at this  time.   ASSESSMENT/PLAN:  This is a 65 year old African American female with a  history of coronary artery disease and coronary vasospasm who presents  with chest pain.  Pain is concerning for repeat vasospasm versus  ischemia.  She did not respond to nitroglycerin or nitroglycerin drip or  morphine in the emergency department.  Her blood pressure is currently  quite low, 85/40.  She will need fluid boluses prior to calcium channel  blocker administration challenge.  I will otherwise rule her out  overnight with cardiac markers and ECGs.  If her troponin becomes  positive, she will be heparinized.  I doubt that her tingling in her  right arm and both legs is related to her chest syndrome, but this will  be followed.  Her hemoglobin is low at 9.2 and I will send off iron and  B12 and folate studies for etiology for her anemia.      Unice Cobble, MD  Electronically Signed     ACJ/MEDQ  D:  11/18/2008  T:  11/19/2008  Job:  [id number]

## 2010-12-24 NOTE — Consult Note (Signed)
[name], [name] NO.:  [id number]   MEDICAL RECORD NO.:  [id number]          PATIENT TYPE:  INP   LOCATION:  2925                         FACILITY:  [facility]   PHYSICIAN:  Shirley Friar, MDDATE OF BIRTH:  04/13/1946   DATE OF CONSULTATION:  11/19/2008  DATE OF DISCHARGE:                                 CONSULTATION   REQUESTING PHYSICIAN:  Candyce Churn, MD   INDICATIONS:  Anemia.   HISTORY OF PRESENT ILLNESS:  [name] is a pleasant 65 year old  black female who was admitted for chest pain thought to be due to  vasospasm in the setting of severe anemia.  She denies any rectal  bleeding, hematochezia, hematemesis, or abdominal pain.  She does report  chronic heartburn.  She last had a colonoscopy in March 2009 by Dr.  Danise Edge which revealed small hyperplastic polyps and internal  hemorrhoids.  That colonoscopy in 2009 was secondary to painless  hematochezia.  On presentation to the hospital at this time, her  hemoglobin was 9.2 which has dropped down to 7.3 with MCV of 67.9 and a  BUN of 11.   PAST MEDICAL HISTORY:  1. Coronary artery disease.  2. Gastroesophageal reflux disease.  3. Hyperlipidemia.  4. Status post bilateral rotator cuff repair.  5. Partial hysterectomy.  6. History of ear surgery.   MEDICATIONS:  See hospital record in terms of doses and they include  amlodipine, aspirin, diltiazem, Lexapro, Neurontin, isosorbide  mononitrate, Altace, Zocor, and rest are p.r.n.   ALLERGIES:  No known drug allergies.   FAMILY HISTORY:  Noncontributory.   SOCIAL HISTORY:  Occasional alcohol, history of smoking, and married.   REVIEW OF SYSTEMS:  Negative from GI standpoint except as stated above.   PHYSICAL EXAMINATION:  VITAL SIGNS:  Afebrile, pulse 50, and blood  pressure 98/52.  GENERAL:  Alert, no acute distress.  ABDOMEN:  Soft, nontender, and positive bowel sounds.   LABORATORY DATA:  White blood count 6.8,  hemoglobin 7.3, and platelet  count 346.  INR 1.0.   IMPRESSION:  A 65 year old black female with microcytic anemia who had a  colonoscopy 1 year ago which revealed hyperplastic polyps and internal  hemorrhoids.  She denies any abdominal symptoms other than chronic  gastroesophageal reflux disease.  She needs to have upper endoscopy to  look for source of her microcytic anemia and upper endoscopy is  unremarkable.  She may be having small bowel arteriovenous malformations  causing her anemia.  We will plan to do upper endoscopy in the a.m.  If  upper endoscopy is negative, the patient likely needs a capsule  endoscopy to look at her small intestine.  I do not think she needs to  have a repeat colonoscopy as she just had one approximately 1 year ago.   Thank you for the consultation.      Shirley Friar, MD  Electronically Signed     VCS/MEDQ  D:  11/19/2008  T:  11/20/2008  Job:  [id number]   cc:   Candyce Churn, M.D.

## 2010-12-24 NOTE — Discharge Summary (Signed)
NAMEDEVORY, MCKINZIE NO.:  [id number]   MEDICAL RECORD NO.:  [id number]          PATIENT TYPE:  INP   LOCATION:  2041                         FACILITY:  [facility]   PHYSICIAN:  Lyn Records, M.D.   DATE OF BIRTH:  1945-11-25   DATE OF ADMISSION:  03/14/2007  DATE OF DISCHARGE:  03/16/2007                               DISCHARGE SUMMARY   DISCHARGE DIAGNOSES:  1. Acute coronary syndrome secondary to endothelial dysfunction.  2. Acute inferior ST-segment elevated myocardial infarction felt      secondary to coronary spasm, normal ejection fraction.  3. Hypertension.  4. Gastroesophageal reflux disease.  5. Depression.  6. Dyslipidemia.  7. Long-term medication use.   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  She came to the  emergency room and was found to have inferior ST-segment elevations.  She was then taken emergently to the cardiac catheterization lab and  found to have severe right coronary artery spasm causing inferior ST  segment elevation.   HOSPITAL LABORATORY WORK:  Did show that she had some elevation in  cardiac enzymes with a maximum troponin of 0.89.  She remained in the  hospital over the next several days, and her medications were adjusted  to treat the vasospastic disease/endothelial dysfunction.  Her heart  rate did drop into the upper 40s/low 50s, and her Cartia was switched  over to Norvasc.  Other lab work during her hospital stay showed a BUN  of 3 and creatinine of 0.59, TSH 0.991.  Hemoglobin 11.6, hematocrit  35.1.   DISCHARGE MEDICATIONS:  1. L-arginine 500 mg twice a day.  2. Norvasc 5 mg a day.  3. Enteric-coated aspirin 325 mg a day.  4. Lipitor 20 mg daily.  5. Lexapro 20 mg a day.  6. Neurontin as prior to admission.  7. Ramipril 2.5 mg a day.  8. Sublingual nitroglycerin p.r.n. chest pain.  9. Klonopin twice a day as needed, as prior to  admission.  10.Prevacid daily, as prior to admission.   Clean cath site gently with soap and water.  RenalRenal, low-sodium,  heart-healthy diet.  Increase activity slowly.  No lifting over 10  pounds for 1 week.  No driving for 2 days.  She is to follow up with Dr.  Katrinka Blazing on March 29, 2007, at 1:15 p.m.  The patient is requested to stop  her Imdur and Cartia.      Guy Franco, P.A.      Lyn Records, M.D.  Electronically Signed    LB/MEDQ  D:  03/16/2007  T:  03/16/2007  Job:  [id number]   cc:   Georgann Housekeeper, MD  Lyn Records, M.D.

## 2010-12-24 NOTE — H&P (Signed)
NAMEKARISS, LONGMIRE NO.:  [id number]   MEDICAL RECORD NO.:  [id number]          PATIENT TYPE:  INP   LOCATION:  1823                         FACILITY:  [facility]   PHYSICIAN:  Cassell Clement, M.D. DATE OF BIRTH:  February 26, 1946   DATE OF ADMISSION:  08/29/2007  DATE OF DISCHARGE:                              HISTORY & PHYSICAL   CHIEF COMPLAINT:  Chest pain.   HISTORY:  This is a 65 year old African-American female admitted with  chest pain.  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 Eldridge Dace March 14, 2007, when she presented  to the emergency room with inferior wall ST-segment elevation and went  emergently to catheterization at that time. She has a history of a prior  catheterization with stent to the mid right coronary artery by Dr. Verdis Prime. At the time of the catheterization in August 2008, it was found  that the stent was widely patent but that the patient had diffuse  coronary spasm in the right coronary artery which responded to  intracoronary nitroglycerin.  The patient has done reasonably well over  the last 4 months. She finished cardiac rehab in December. Recently she  had some chest pain off and on, but today in church after singing in the  choir, she had a sudden severe chest pain which caused her to have near  syncope. EMS was called and brought her to the ER. Her cardiac enzymes  have been negative, and EKG shows lateral T-wave inversion.   FAMILY HISTORY:  Reveals that her mother is living but has heart  trouble.  Father died of unknown cause.   SOCIAL HISTORY:  The patient is married.  She has three children.  She  is on disability from her heart problems and her reflux problems.  She  does smoke several cigarettes a day.  She drinks occasional beer.   ALLERGIES:  She denies any drug allergies.   PRESENT MEDICATIONS:  1. Norvasc 5 mg daily.  2  Coated aspirin 325 mg daily.  1. Lipitor 20 mg  daily.  2. Lexapro 20 mg daily.  3. Gabapentin 400 mg daily.  4. Ramipril 2.5 mg daily.  5. Klonopin 0.5 mg p.o. b.i.d. p.r.n.  6. Prevacid of Protonix daily p.r.n.   PAST SURGERIES:  Includes right coronary artery stents several years  ago.  She has also had bilateral rotator cuff repair.  She had a partial  hysterectomy.  She had left ear surgery.   REVIEW OF SYSTEMS:  Reveals that she does have a history of severe  reflux disease.  Bowel habits have been normal otherwise.  GENITOURINARY:  Reveals no dysuria.  RESPIRATORY:  Reveals no sputum  production or cough.   PHYSICAL EXAMINATION:  VITAL SIGNS:  Blood pressure is 100/60 on IV  nitroglycerin drip, pulse 51 regular, respirations normal.  HEENT: Negative.  NECK:  Jugular venous pressure normal.  CHEST:  Clear.  HEART:  No murmur, gallop or rub.  ABDOMEN:  Reveals no hepatosplenomegaly or mass.  EXTREMITIES:  Show good peripheral pulses.  No edema or  phlebitis.  SKIN:  Warm and dry.   Chest x-ray shows normal heart size, clear lungs.   EKG shows sinus bradycardia with nonspecific inferolateral T-wave  changes.   Blood work includes point-of-care cardiac enzymes which are negative x1.   IMPRESSION:  1. Possible acute coronary syndrome with ongoing chest pain.  2. Prior coronary artery stent.  3. Known vasospastic coronary artery disease found at catheterization      March 14, 2007.  4. Hypercholesterolemia.  5. History of gastroesophageal reflux disease.   DISPOSITION:  She is being admitted to Dr. Verdis Prime to telemetry or  step-down.  We will continue IV nitroglycerin which is already in place,  add IV heparin. Will continue Lipitor, aspirin and Cartia or Norvasc. We  will hold breakfast Monday morning until seen by Dr. Katrinka Blazing to evaluate  for possible cardiac catheterization versus Cardiolite.           ______________________________  Cassell Clement, M.D.     TB/MEDQ  D:  08/29/2007  T:  08/29/2007  Job:   [id number]   cc:   Lyn Records, M.D.  Georgann Housekeeper, MD

## 2010-12-24 NOTE — Discharge Summary (Signed)
NAMEALEXSYS, ESKIN NO.:  [id number]   MEDICAL RECORD NO.:  [id number]          PATIENT TYPE:  INP   LOCATION:  2925                         FACILITY:  [facility]   PHYSICIAN:  Georgann Housekeeper, MD      DATE OF BIRTH:  1946-07-18   DATE OF ADMISSION:  11/18/2008  DATE OF DISCHARGE:  11/20/2008                               DISCHARGE SUMMARY   DISCHARGE DIAGNOSIS:  1. Chest pain, rule out myocardial infarction.  2. History of coronary artery disease.  3. Severe anemia.  No active bleed.  Colonoscopy in 2009,      unremarkable.  EGD pending.  Iron-deficiency.  4. History of gastroesophageal reflux disease.  5. History of esophageal dysmotility.  6. History of depression.   MEDICATION ON DISCHARGE:  1. Simvastatin 20 mg daily.  2. Citalopram 20 mg daily.  3. Clonazepam 1 mg at bedtime.  4. Imdur 120 mg daily.  5. Lipitor 20 mg daily.  6. Aspirin 81 mg daily.  7. Neurontin 4 mg daily.  8. Amlodipine 5 mg daily.  9. Prevacid 30 mg daily.  10.Ramipril 2.5 mg daily.   LABORATORY DATA:  Hemoglobin was 7.3, after transfusion went up to 10.0,  at discharge it was 10.6; white count was normal at 6.2; and platelets  304.  Chemistries:  Sodium 141, potassium 3.9, creatinine 0.6, BUN of 7,  and glucose 106.  Cardiac markers and troponin were negative x3.  Iron  level was 12.  B12 607, normal.  Ferritin was 4.  Folate 9.0.  Chest x-  ray negative.  EKG normal sinus rhythm.   HOSPITAL COURSE:  A 65 year old female with above medical conditions  present with chest pain syndrome with history of coronary artery  disease.  She had also has Coronary vasospasm was admitted to Cardiology  Service and she ruled out and thought to be possibly other coronary  vasospastic disease.  No evidence of any myocardial infarction.  She had  found to be severe anemia, transfused 2 units of blood.  No evidence of  active GI bleed.  The patient had iron levels.  Iron indices done showed  iron-deficiency anemia.  She had been consulted with GI.  We will workup  as endoscopy.  She only had a colonoscopy.  Possible AVM bleed.  There  was no evidence of any active GI bleed with no melena or any blood in  the stool.  She has no abdominal pain.  If the endoscopy is negative,  the patient  can be discharged and possible follow up capsule endoscopy for  completion of workup will start on when new iron 150 b.i.d., decrease  the aspirin dose to 81 mg.  As far, her other medications will be  continued.  I will see her at the office in 2 weeks and check a CBC  again.      Georgann Housekeeper, MD  Electronically Signed     KH/MEDQ  D:  11/20/2008  T:  11/20/2008  Job:  [id number]

## 2010-12-24 NOTE — Discharge Summary (Signed)
NAMECHRISTOL, THETFORD NO.:  [id number]   MEDICAL RECORD NO.:  [id number]          PATIENT TYPE:  INP   LOCATION:  2627                         FACILITY:  [facility]   PHYSICIAN:  Lyn Records, M.D.   DATE OF BIRTH:  05/08/1946   DATE OF ADMISSION:  08/29/2007  DATE OF DISCHARGE:  08/31/2007                               DISCHARGE SUMMARY   DISCHARGE DIAGNOSES:  1. Chest pain, relieved with morphine, not improved with sublingual      nitroglycerin.  2. History of coronary vasospasm.  3. Known coronary artery disease.  4. History of prior right coronary artery stent.  5. Hyperlipidemia.  6. Gastroesophageal reflux disease.  7. Family history of coronary artery disease.   HOSPITAL COURSE:  Ms. Dyches is a 65 year old female admitted with  chest pain on August 29, 2007.  In August 2008, she presented with an  inferior ST-segment elevated myocardial infarction and went emergently  to the catheterization lab where the cath showed patent stent to the mid  RCA.  There is question of vasospasm.   She came into the hospital on August 29, 2007, with chest pain.  Her  troponins were negative x3 and CKs were essentially normal.  Her pain  was better with morphine not sublingual nitroglycerin and we decided to  let her go home after 48 hours of monitoring.   Labs today showed a BUN 10, creatinine 0.8, potassium 4.2.  Cardiac  isoenzymes negative.   The patient states that her chest pain is different than the vasospasm  pain that she has had in the past.  We will continue aggressive  secondary (prevention for her known coronary artery disease and continue  her ACE inhibitor and calcium channel blocker.   FOLLOW UP:  She is to follow up with Dr. Katrinka Blazing on September 14, 2007, at  1:30 p.m.   ACTIVITY:  She is to increase her activity slowly.   DIET:  Remain on a low-fat diet.   DISCHARGE MEDICATIONS:  Continue same medications as prior to admission:  1.  Enteric-coated aspirin 325 mg a day.  2. Norvasc 5 mg a day.  3. Lipitor 20 mg a day.  4. Lexapro 20 mg a day.  5. Gabapentin 400 mg a day.  6. Altace 2.5 mg a day.  7. Klonopin b.i.d. p.r.n.  8. Stomach pill (Prevacid) as prior to admission.      Guy Franco, P.A.      Lyn Records, M.D.  Electronically Signed    LB/MEDQ  D:  08/31/2007  T:  08/31/2007  Job:  [id number]   cc:   Georgann Housekeeper, MD

## 2010-12-24 NOTE — Op Note (Signed)
[name], [name] NO.:  [id number]   MEDICAL RECORD NO.:  [id number]          PATIENT TYPE:  AMB   LOCATION:  ENDO                         FACILITY:  [facility]   PHYSICIAN:  Danise Edge, M.D.   DATE OF BIRTH:  03-03-1946   DATE OF PROCEDURE:  10/13/2007  DATE OF DISCHARGE:                               OPERATIVE REPORT   PROCEDURE:  Colonoscopy and polypectomy.   PROCEDURE INDICATION:  [name] is a 65 year old female born  08-24-1945.  [name] is undergoing diagnostic colonoscopy  following an episode of painless hematochezia.   On June 18, 2004, her screening proctocolonoscopy to the cecum was  normal.   On Dec 18, 2006, her esophagogastroduodenoscopy was normal.  Esophageal  biopsies did not show eosinophilic esophagitis.   CHRONIC MEDICATIONS:  Amlodipine, ramipril, omeprazole, simvastatin,  citalopram, L-arginine, enteric-coated aspirin, clonazepam, Percocet,  gabapentin.   PAST MEDICAL - SURGICAL HISTORY:  1. Coronary artery disease with coronary artery stenting.  2. Hysterectomy.  3. Right breast surgery.   MEDICATION ALLERGIES:  None.   HABITS:  One-half pack per day smoker for over 30 years.   ENDOSCOPIST:  Reece Agar.   PREMEDICATION:  1. Fentanyl 50 mcg.  2. Versed 7 mg.   PROCEDURE:  After obtaining informed consent, [name] was placed  in the left lateral decubitus position.  I administered intravenous  fentanyl and intravenous Versed to achieve conscious sedation for the  procedure.  The patient's blood pressure, oxygen saturation and cardiac  rhythm were monitored throughout the procedure and documented in the  medical record.   Anal inspection and digital rectal exam were normal.  The Pentax  pediatric colonoscope was introduced into the rectum and easily advanced  to the cecum.  A normal-appearing appendiceal orifice and ileocecal  valve were identified.  Colonic preparation for the exam  today was  excellent.   Rectum normal.  Retroflexed view of the distal rectum reveals moderate  size, nonbleeding internal hemorrhoids.  Sigmoid colon and descending colon.  From the mid-distal sigmoid colon,  3 diminutive sessile polyps were removed with the cold biopsy forceps.  Splenic flexure normal.  Transverse colon normal.  Hepatic flexure normal.  Ascending colon normal.  Cecum and ileocecal valve normal.   ASSESSMENT:  1. Three diminutive sessile polyps were removed from the mid-distal      sigmoid colon with the cold biopsy forceps.  2. Moderate sized internal hemorrhoids.  3. Resolved painless hematochezia probably due to an internal      hemorrhoidal bleed.           ______________________________  Danise Edge, M.D.     MJ/MEDQ  D:  10/13/2007  T:  10/13/2007  Job:  [id number]   cc:   Georgann Housekeeper, MD

## 2010-12-24 NOTE — Op Note (Signed)
[name], [name] NO.:  [id number]   MEDICAL RECORD NO.:  [id number]          PATIENT TYPE:  INP   LOCATION:  2925                         FACILITY:  [facility]   PHYSICIAN:  Petra Kuba, M.D.    DATE OF BIRTH:  28-Dec-1945   DATE OF PROCEDURE:  11/20/2008  DATE OF DISCHARGE:  11/20/2008                               OPERATIVE REPORT   PROCEDURE:  Esophagogastroduodenoscopy with biopsy.   INDICATION:  The patient with anemia, nondiagnostic colonoscopy a year  ago.  Consent was signed after risks, benefits, methods, and options  thoroughly discussed prior to any sedation by me today and yesterday by  my partner, Dr. Bosie Clos.   MEDICINES USED:  1. Fentanyl 50 mcg.  2. Versed 6 mg.   PROCEDURE:  The video endoscope was inserted by direct vision.  The  esophagus was normal.  Scope passed into the stomach and advanced  through a normal pylorus into a normal duodenal bulb and around the C-  loop to a normal second portion of the duodenum.  No blood was seen  distally.  Scope was withdrawn back to the bulb and a good look there  ruled out abnormalities in all locations.  Scope was withdrawn back to  the stomach.  She did have some mild linear antritis as well as in the  distal greater curve with few erosions and a linear ulcer.  Two biopsies  at the antrum and few of the ulcer were obtained at the end of the  procedure as well as 2 with the plan just to rule out Helicobacter.  The  rest of the stomach was evaluated on retroflexion and then straight  visualization without additional findings with specifically the cardia,  fundus, angularis, proximal lesser and greater curve were all normal on  both straight and retroflex visualization.  After the biopsies were  obtained, the airway was suctioned.  Scope was slowly withdrawn.  Again,  a good look at the esophagus was normal.  Scope was removed.  The  patient tolerated the procedure well.  There was no obvious  immediate  complication.   ENDOSCOPIC DIAGNOSES:  1. Minimal antritis, status post biopsy.  2. Deep distal greater curve erosions and one linear ulcer status post      biopsy.  3. Otherwise normal esophagogastroduodenoscopy with proximal stomach      biopsy to rule out Helicobacter as well.   PLAN:  Await pathology.  Continue iron and pump inhibitors and care with  aspirin and nonsteroidals.  If she remains guaiac positivity or  continues to drop in her hemoglobin, consider repeat colonoscopy versus  capsule endoscopy.  Either myself or Dr. Laural Benes or Dr. Bosie Clos have to  see her back in a few weeks and we will call her and check on her with  the biopsy.           ______________________________  Petra Kuba, M.D.     MEM/MEDQ  D:  11/20/2008  T:  11/21/2008  Job:  [id number]   cc:   Shirley Friar, MD  Lyn Records, M.D.  Danise Edge,  M.D. 

## 2010-12-24 NOTE — Cardiovascular Report (Signed)
NAMEDENETTA, FEI NO.:  [id number]   MEDICAL RECORD NO.:  [id number]          PATIENT TYPE:  INP   LOCATION:  2914                         FACILITY:  [facility]   PHYSICIAN:  Corky Crafts, MDDATE OF BIRTH:  25-Mar-1946   DATE OF PROCEDURE:  DATE OF DISCHARGE:                            CARDIAC CATHETERIZATION   PROCEDURE PERFORMED:  Left heart catheterization, left ventriculogram,  coronary angiogram, abdominal aortogram.   OPERATOR:  Dr. Eldridge Dace.   INDICATION:  Acute inferior ST-segment elevation MI.   PROCEDURE NARRATIVE:  The risks and benefits of cardiac catheterization  were explained to the patient and informed consent was obtained.  The  patient was brought to the cath lab.  She was prepped and draped in the  usual sterile fashion.  Her right groin was infiltrated with 1%  lidocaine.  A 6-French arterial sheath was placed into the right femoral  artery using the modified Seldinger technique.  Left coronary artery  angiography was performed using a JL-3.5 catheter.  The catheter was  advanced to the vessel ostium under fluoroscopic guidance.  Digital  angiography was performed in multiple projections using hand injection  of contrast.  Right coronary artery angiography was then performed using  a JR-4 pigtail catheter.  The catheter was advanced to the vessel ostium  under fluoroscopic guidance.  Digital angiography was performed in  multiple projections using hand injection of contrast.  A pigtail  catheter was then advanced to the ascending aorta and across the aortic  valve.  Power injection of contrast was done in the RAO projection to  image the left ventricle.  The catheter was pulled back under continuous  hemodynamic pressure monitoring.  The catheter was pulled back to the  level of the renal arteries.  Power injection of contrast done in the AP  projection.  The sheath was removed using manual compression Angiomax  was used for  anticoagulation during the procedure.   FINDINGS:  The left main is widely patent.  The left circumflex is a  medium-sized vessel.  There is an OM1 which is medium size with a 25%  proximal lesion.  The left anterior descending was a large vessel with  mild irregularities. The first and second diagonals were small vessels.  The third diagonal was a medium-sized vessel with a 70% mid vessel  lesion.  The right coronary artery was a small vessel with diffuse  spasm.  The mid right coronary artery stent was widely patent.  After  200 mcg of intracoronary nitroglycerin was administered, the spasm was  relieved.  The caliber of the vessel increase markedly, and there was  good perfusion throughout the vessel.   HEMODYNAMICS:  Left ventricular pressure of 112/8 with an LVEDP of 25  mmHg.  Aortic pressure of 115/54 with a mean aortic pressure of 78  mmHg5.  The left ventriculogram showed a small area of focal inferior  hypokinesis.  Overall, there is a normal left ventricular ejection  fraction estimated EF was 50-55%.  The abdominal aortogram showed  bilateral single renal arteries which are widely patent.  There is mild  aortic atherosclerosis.  IMPRESSION:  1. 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  2. Small inferior wall motion abnormality with an ejection fraction of      55%.  3. No renal artery stenosis.   RECOMMENDATIONS:  Will watch the patient in the CCU.  Will also plan on  starting her on Integrilin, as well as Plavix, just in case there are  any micro platelet aggregates which may have embolized.      Corky Crafts, MD  Electronically Signed     JSV/MEDQ  D:  03/14/2007  T:  03/14/2007  Job:  [id number]   cc:   Georgann Housekeeper, MD

## 2010-12-27 NOTE — Consult Note (Signed)
[place]  Patient:    [name], [name]                        MRN: [id number] Proc. Date: 12/15/00 Adm. Date:  [id number] Attending:  Osvaldo Human CC:         Tyson Dense, M.D.   Consultation Report  CHIEF COMPLAINT:  Chest pain.  HISTORY OF PRESENT ILLNESS:  This is a 65 year old black female with a history of coronary artery disease with PTCA of the LAD in 1993, and then again PTCA of LAD and anterolateral.  He subsequently had a PTCA of the anterolateral and a stent to the RCA, and then a rotoblade to the LAD.  Last cardiac evaluation was about 11 months ago with a cardiolite.  In the last several weeks he has been having frequent pain that starts in her back and it radiates through to her chest.  The pain is described as sharp as a knife and comes on suddenly and then resolves within about minutes.  Nitroglycerin does not tend to help it.  She has been seen by Dr. Eula Listen for this as well as Dr. Laural Benes and had an EGD which was suboptimal.  She had a barium swallow just last week which apparently was okay.  She is felt to have esophageal dysmotility and spasm by Dr. Eula Listen and is being treated with long-acting nitrates and PPI carafate.  Today, at work, she had two episodes of similar pain starting in the back and then radiating through to the chest.  There was no shortness of breath.  She did vomit once.  The pain lasted no longer than 5 minutes.  She was sent to the emergency room by the nurse as her work place for further evaluation.  PAST MEDICAL HISTORY: 1. Coronary artery disease. 2. History of coronary artery vasospasm. 3. History of GERD and esophageal dysmotility.  ALLERGIES:  No known drug allergies.  CURRENT MEDICATIONS: 1. Estrace 1 mg q. day. 2. Imdur 120 mg q. day. 3. Toprol XL 50 mg q. day. 4. Prevacid 30 mg a day. 5. Lipitor 10 mg a day. 6. Aspirin 1 a day. 7. Carafate 1 gram b.i.d.  PAST SURGICAL  HISTORY: 1. Hysterectomy. 2. ARthroscopic surgery of the left knee. 3. Laceration of the ear, repaired. 4. Right breast biopsy.  FAMILY HISTORY:  Father died in his 37s, of leukemia.   Mother history of coronary artery disease.  Brother hypertension.  Sister CVA.  SOCIAL HISTORY:  Patient works.  She is divorced and has 3 children.  She actively smokes.  PHYSICAL EXAMINATION:  GENERAL:  A well appearing black female.  She is in no distress.  VITAL SIGNS:  Blood pressure 126/72, heart rate is 56, respirations 20, temperature 97.5.  Oxygen saturations 99% on room air.  LUNGS:  Clear.  HEART:  Regular rate and rhythm without murmur, gallop, or rub.  CHEST:  Chest wall:  She has point tenderness over the left costochondral junctions.  ABDOMEN:  She has minimal epigastric discomfort without rebound nor guarding, no mass.  EXTREMITIES:  No edema.  LABORATORY DATA:  CBC:  WBC 6.1, hemoglobin 12.9, platelet count 263. Creatinine kinase is 133, creatinine kinase MB is 2.4.  Troponin I is less than 0.01.  Chest x-ray shows no acute disease.  EKG shows sinus bradycardia with a rate of 53, possible old anterior infarct, and no acute ST-T wave changes.  ASSESSMENT:  Chest pain:  This has been a chronic problem.  The differential is esophageal pain versus costochondritis versus coronary vasospasm.  She has been having this pain for weeks and I do not think she requires hospital admission given her normal EKG and negative cardiac enzymes.  PLAN: 1. Discharge to home. 2. She has a follow up appointment with Dr. Eula Listen in the morning. I will    let him decide on any further work-up at that time. DD:  12/15/00 TD:  12/15/00 Job: [id number]

## 2010-12-27 NOTE — Op Note (Signed)
NAME:  PAISLY, FINGERHUT                       ACCOUNT NO.:  [id number]   MEDICAL RECORD NO.:  [id number]                   PATIENT TYPE:  AMB   LOCATION:  DSC                                  FACILITY:  [facility]   PHYSICIAN:  Mila Homer. Sherlean Foot, M.D.              DATE OF BIRTH:  [birth year]   DATE OF PROCEDURE:  01/11/2003  DATE OF DISCHARGE:                                 OPERATIVE REPORT   PREOPERATIVE DIAGNOSIS:  Right shoulder impingement with rotator cuff tear.   POSTOPERATIVE DIAGNOSIS:  Right shoulder impingement with rotator cuff tear.   OPERATION PERFORMED:  Right shoulder arthroscopy with subacromial  decompression and mini open rotator cuff repair.   SURGEON:  Mila Homer. Sherlean Foot, M.D.   ASSISTANT:  Jamelle Rushing, P.A.   ANESTHESIA:  General plus interscalene block.   INDICATIONS FOR PROCEDURE:  The patient is a 65 year old with MRI evidence  of a full thickness rotator cuff tear.  Informed consent was obtained.   DESCRIPTION OF PROCEDURE:  The patient was laid supine and administered  general anesthesia after interscalene block in the preanesthesia holding  area.  She was then placed in the beach chair position and the right  shoulder was prepped and draped in the usual sterile fashion.  The posterior  portal, a direct lateral portal was created with a #11 blade, blunt trocar  and cannula.  Glenohumeral arthroscopy was unremarkable with no arthritis  but I did see a cuff tear from inside the joint.  I then performed a  subacromial decompression with the gator shaver and the 6.0 cylindrical bur  and 90 degree ArthroCare wand.  I then noticed a very, very thin portion and  what appeared to be a puncture of the infraspinatus insertion.  I therefore  elected to convert to a mini open technique and palpate it with my finger  and did find a full thickness albeit small surrounding approximately a 1.5 x  1.5 cm near complete tear that was down to a thin film of tissue.  I  therefore elected to excise this.  I then roughened up the bone and placed a  single anchor and then placed two simple sutures out of the anchor through  this cuff tear and oversewed it.  It came together very, very nicely.  I cut  the loose ends of the suture and at this point closed with interrupted 0 and  2-0 Vicryl.  Dressed with Xeroform, dressing sponges and sterile Webril, two  inch silk tape, a sling and swath.   COMPLICATIONS:  None.   DRAINS:  None.                                               Mila Homer. Sherlean Foot, M.D.    SDL/MEDQ  D:  01/11/2003  T:  01/11/2003  Job:  [id number]

## 2010-12-27 NOTE — H&P (Signed)
[place]  Patient:    [name], [name]                        MRN: [id number] Adm. Date:  [id number] Disc. Date: [id number] Attending:  Dennison Bulla Ii Dictator:   Anselm Lis, N.P. CC:         Tyson Dense, M.D.   History and Physical  PRIMARY CARE Ziva Nunziata:  Tyson Dense, M.D.  DATE OF BIRTH:  1946-07-04  IMPRESSION: 1. Unstable angina pectoris likely related to coronary artery spasm. EKG with    inferior and lateral/precordial T wave inversion without ST changes; new    from EKG earlier this month. First set of cardiac enzymes are negative. She    does have a history of threatened inferior myocardial infarction secondary    to severe coronary artery spasm which was aborted with intracoronary    nitroglycerin October 2000. Known history of coronary artery disease with    prior PTCA of the diagonal December 03, 1998, stent of the mid RCA November 23, 1998, and PTCA of the LAD and diagonal May 1998, and PTCA of LAD in 1983.    This one done by W. Ashley Royalty., M.D. Her last diagnostic heart    catheterization was June 2001 by Francisca December, M.D. revealing intact LV    size and function with EF of 61%, diffuse coronary artery vasospasm with    focal stenosis most severe in large obtuse marginal #1. All of which    resolved with intracoronary nitroglycerin, 30% residual at site of prior    stent RCA. The patient continues with low-grade chest pain on IV/sublingual    nitrates, IV heparin, and status post four baby aspirin, Pepcid, 4 mg    morphine IV. Her first set of cardiac enzymes are negative. 2. History of diffuse esophageal spasm documented by past esophageal    manometry. Upper GI February 2002 revealed small sliding hiatal hernia    without reflux. Tertiary contractions of esophagus. No peptic ulcer    disease. She does have a history of gastroesophageal reflux disease. Prior    EGD attempted by Charolett Bumpers III,  M.D., unsuccessful secondary to    inability to pass to scope in the posterior pharynx. 3. History of ongoing tobacco abuse. 4. History of syncope thought vasovagal setting of headache. 5. Question history of asthma. 6. History of hysterectomy with BSO. 7. Arthroscopic left knee surgery. 8. Left breast biopsy.  PLAN:  Admit to step-down unit, serial cardiac enzymes, and daily EKGs. Continue IV and sublingual nitrates, as well as heparin. If rules out and becomes pain free, will anticipate discharge home in the morning with follow-up GI studies as planned by Verlin Grills, M.D. Will not plan recatheterization unless develops ST segment changes.  HISTORY OF PRESENT ILLNESS:  [name] is a pleasant, 65 year old female with known history of coronary artery spasm and esophageal spasms and GERD, also ongoing tobacco abuse. She awoke at 4 a.m. with lower abdominal, sharp, knotting discomfort which developed into lower epigastric, lower substernal discomfort with associated diaphoresis and nausea. She took a total of three sublingual nitrates without change. Finally, called the EMS about 11 a.m. when received additional sublingual nitrates and baby aspirin without appreciable change. At [facility], she received a total of three sublingual nitrates, IV nitrates, IV heparin, Pepcid, and 4 mg IV morphine, as well  as GI cocktail with some improvement of pain from 7/10 to 3/10.  ALLERGIES:  No known drug allergies. Okay with seafood, shellfish, and iodinated products.  CURRENT MEDICATIONS: 1. Estrace 1 mg p.o. q.d. 2. Norvasc question 10 mg p.o. q.d. 3. Lipitor 10 mg q.d. 4. ______ twice daily but none x 3 days. 5. Imdur 120 p.o. q.d. 6. No aspirin for the last two weeks. 7. Sublingual nitrates p.r.n.  SOCIAL HISTORY/HABITS:  Tobacco:  Smokes about five cigarettes per day. Prior to this about 1 1/2 packs per day for 35 years. Has had episodes of quitting but  relapsed. ETOH:  About two to five beers per day. 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  FAMILY HISTORY:  Mother age 14, positive CAD (Dr. Francisca December patient). Father died at age 25 of leukemia. No CAD. One brother with hypertension and a sister who had a stroke.  REVIEW OF SYSTEMS:  As in HPI/brief medical history. Otherwise, complains episodic light headedness with associated blurred vision. When she has her chest pain, she feels a little light headed with his as well. She has episodic dysphagia. Denies melena, bright red blood per rectum, constipation, or diarrhea. 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 Negative hematuria or dysuria. She has some arthritic-type complaints left knee, hands, and shoulder. Denies pedal edema.  PHYSICAL EXAMINATION:  VITAL SIGNS:  Blood pressure is 122/69, heart rate 49 and regular, respiratory rate 16, O2 saturation 99% on 2 L nasal cannula.  GENERAL:  She is a well-nourished, middle-aged female in no apparent distress. Her daughter is in attendance.  HEENT/NECK:  Brisk bilateral carotid upstroke without bruits. No significant JVD nor thyromegaly.  CHEST:  Lung sounds clear with equal bilateral excursion. No CPA tenderness.  CARDIAC:  Regular rate and rhythm without very soft systolic murmur. No rub or gallop. Normal S1 and S2.  ABDOMEN:  Soft, nondistended. Normoactive bowel sounds. Negative abdominal aortic, renal, or femoral bruits. Nontender to applied pressure though has ______ exacerbation of underlying discomfort with applied epigastric pressure.  EXTREMITIES:  Distal pulses intact. Negative pedal edema.  NEUROLOGICAL:  Grossly intact. Nonfocal. Alert and oriented x 3.  GENITOURINARY:  Deferred.  RECTAL:  Deferred.  LABORATORY DATA:  Chest x-ray revealed no active disease.  Sodium 143, potassium 3.8, chloride 108, CO2 29, BUN 8, creatinine 0.9, and glucose of 110. Hemoglobin 12.9, hematocrit 37.3, WBCs  6.6, and platelets of  261. Troponin I 0.01. CK 113, with MB fraction 1.4. PT of 12.8, INR of 1, PTT of 33.  EKG revealed sinus brady with T wave inversion inferior and V4 through V6. 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 No significant change serial EKGs.DD:  02/03/01 TD:  02/03/01 Job: [id number]

## 2010-12-27 NOTE — Consult Note (Signed)
[place]. [facility]  Patient:    [name], [name]                        MRN: [id number] Adm. Date:  [id number] Attending:  Lum Babe                          Consultation Report  REASON FOR CONSULTATION:  Recurrent unstable angina.  HISTORY OF PRESENT ILLNESS:  This is a 65 year old woman with positive history of tobacco abuse and known CAD (prior ______ diagonal April 2000, stent mid RCA April 2000).  Status post an aborted inferior wall MI secondary to proximal RCA spasm relieved by IC nitroglycerin in October 2000.  Presented today to the office with recurrent anginal symptoms.  The symptoms began about 5:00 in the morning and have been associated with diaphoresis, nausea, and weakness.  The pain radiates to the right subscapular area from the anterior chest.  Each episode has lasted about three minutes and she has had about three of these.  She was taking sublingual nitroglycerin at home and has also been given sublingual nitroglycerin in the office.  The pain has been eased off by nitroglycerin each time.  She was transported by EMS to [facility] Emergency Room receiving aspirin in route and is currently experiencing pain at a level of 4/10 on IV nitroglycerin 3 cc/hour and heparin.  ECG does not show any significant ischemic changes.  PAST MEDICAL HISTORY: 1. Atherosclerotic coronary disease as noted above also status post PTCA LAD and diagonal May 1998 and PTCA of the LAD in 1983. 2. Tobacco abuse. 3. Status post hysterectomy with BSO. 4. History of asthma. 5. Arthroscopic knee surgery. 6. Right breast biopsy.  ALLERGIES:  None known.  MEDICATIONS: 1. Imdur 120 mg p.o. q.d. 2. Cardizem CD 240 mg p.o. b.i.d. 3. Aspirin 325 mg p.o. q.d. 4. Prevacid 30 mg p.o. b.i.d. 5. Estrace 1 mg p.o. q.d. 6. Nitroglycerin sublingual 0.4 mg p.r.n.  SOCIAL HISTORY:  Has tobacco use about one and one-half packs of cigarettes for 35 years.  Recent quit, but has  not relapsed.  ETOH:  "A few" beers per day. 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  FAMILY HISTORY:  Mother age 45 has coronary heart disease and severe mitral regurgitation and intermittent PHF and is my patient.  Father died age 51 of leukemia.  No history of CAD.  Brother has hypertension.  One sister has had a stroke.  REVIEW OF SYSTEMS:  She has episodic dizziness and episodic shortness of breath if she is having to go up stairs.  She has occasional throat "closing up."  Underwent EGD which was normal.  She has bright red blood on the toilet paper.  No melena.  Current stool guaiac is pending.  Denies any dysuria.  She has arthralgia in the left knee, hand, and shoulders.  No pedal edema.  PHYSICAL EXAMINATION:  VITAL SIGNS: Blood pressure 120/70.  Pulse 80 and regular.  Respiratory rate 16.  Temperature afebrile.  GENERAL:  This is a well-nourished, well-developed 65 year old woman in no acute distress who continues to complain of mild anterior chest discomfort.  HEENT:  Pupils are equal, round and reactive to light and accommodation. Extraocular movements are intact.  Sclerae:  Anicteric.  Oral mucosa is pink and moist.  Teeth and gums in good repair.  The cranium is normocephalic and atraumatic.  NECK:  Supple without  thyromegaly or masses.  Carotid upstrokes are normal. There is no bruit.  There is no jugular venous distention.  CHEST:  Bibasilar rales which clear posttussive.  Good excursion.  Adequate air movement.  HEART:  Regular rhythm.  Normal S1 and S2 is heard.  No murmur, click, or rub. ______.  PMI is nondisplaced.  ABDOMEN:  Soft, flat, nontender.  No hepatosplenomegaly or midline pulsatile masses.  Normoactive bowel sounds in all quadrants.  No abdominal bruit.  EXTREMITIES:  No pedal edema.  Full range of motion.  Radial femoral and dorsalis pedis pulses 2+/4.  NEUROLOGIC:  Cranial nerves II-XII intact.  She is alert and oriented x  3.  LABORATORIES:  Admission hemogram:  Serum electrolytes:  BUN, creatinine, glucose, PT, PTT all within normal limits.  Chest x-ray is pending.  Electrocardiogram shows evidence of lateral infarct but the leads may be reversed.  No ST segment changes.  IMPRESSION: Unstable anginal symptoms in a 65 year old woman status post multiple revascularizations and with a known history of marked coronary vasospasm now presents again with likely acute coronary syndrome.  PLAN/RECOMMENDATIONS:  Agree with your management thus far which includes aspirin, IV nitroglycerin, intravenous heparin.  If the ECG changes were present, would recommend initiation of platelet 2B 3A inhibitor or if CK-MB/troponin enzyme return positive.  Will plan on repeat cardiac catheterization, coronary angiography, possible PTCA on January 21, 2000.  Treat persistent chest discomfort with increasing doses of IV nitroglycerin and morphine sulfate.DD:  01/20/00 TD:  01/20/00 Job: [id number]

## 2010-12-27 NOTE — Cardiovascular Report (Signed)
[place]. [facility]  Patient:    [name], [name]                        MRN: [id number] Proc. Date: 02/04/01 Adm. Date:  [id number] Attending:  Cathren Laine CC:         Tyson Dense, M.D., [facility]  Charolett Bumpers III, M.D. [facility]   Cardiac Catheterization  INDICATIONS FOR PROCEDURE:  Recurrent near refractory episodes of chest discomfort with trace abnormal CK enzymes and troponins in this patient with known history of recurrent coronary artery spasm, but also a history of fixed obstructive coronary lesions that at one point required angioplasty on LAD and diagonal and also stent of the right coronary.  The last interventional procedure was performed in April of 2000.  PROCEDURES PERFORMED: 1. Left heart catheterization. 2. Selective coronary angiography. 3. Left ventriculography.  DESCRIPTION OF PROCEDURE:  After informed consent, a 6 French sheath was inserted into the right femoral artery using a modified Seldinger technique. A 6 French A2 multipurpose catheter was used for hemodynamic recordings, left ventriculography, and selective left coronary angiography.  A #4 6 French right Judkins catheter was used for right coronary angiography.  Because of catheter damping and what appeared to be diffuse right coronary vasoconstriction, 200 mcg of intracoronary nitroglycerin was administered with significant improvement tent in the caliber of the vessel and resolution of catheter damping.  The case was terminated.  Hemostasis was achieved. No complications occurred.  RESULTS:  I:  HEMODYNAMIC DATA:     a. The aortic pressure 126/69 mmHg.     b. Left ventricular pressure 126/14 mmHg.  II:  LEFT VENTRICULOGRAPHY:  The left ventricle is normal in size and demonstrates normal contractility.  The EF is greater than 60%.  III:  SELECTIVE CORONARY ANGIOGRAPHY:     a. Left main coronary:  Normal.     b. Left anterior descending  coronary:  The left anterior descending        coronary artery is a large vessel that wraps around the left        ventricular apex.  It gives origin to a large first diagonal.  There        is perhaps 50% narrowing at the origin of this diagonal.  No        significant obstruction is noted at any point in the LAD system or        the diagonal system.  The LAD diagonal is the site of prior        percutaneous coronary intervention.     c. Circumflex artery:  The circumflex artery is large.  It gives origin        to a large circumflex system.  No significant abnormalities are noted.        There is perhaps very mild luminal irregularity noted in the proximal        portion of the first obtuse marginal.     d. Right coronary artery:  The right coronary artery contains a mid        vessel stent.  The initial picture demonstrated mild generalized        vasoconstriction in the right coronary and there was catheter damping        with engagement.  A total of 200 mcg of intracoronary nitroglycerin        led to marked improvement in the diameter of the right coronary  and        resolution of catheter damping.  No significant obstruction is noted        in the right coronary.  There was perhaps 30% narrowing within the        proximal portion of the right coronary stented region but no high-grade        obstruction was noted.  CONCLUSIONS: 1. No significant obstructive coronary lesions are noted. 2. Widely patent mid right coronary stent. 3. Widely patent left anterior descending diagonal which were sites of    previous percutaneous coronary interventions. 4. There was mild generalized vasoconstriction of the right coronary that    was relieved after intracoronary nitroglycerin. 5. Normal left ventricular function. 6. Based upon findings from this study, I do not feel that the patients    recent episodes of prolonged chest discomfort are ischemic in origin.  PLAN:  Continue Norvasc and  Imdur.  Complete GI work up.  Start Librax.  No further cardiac evaluation.  We will discharge the patient when nitroglycerin intravenously has been weaned and discontinued. DD:  02/04/01 TD:  02/04/01 Job: [id number]

## 2010-12-27 NOTE — Consult Note (Signed)
NAME:  [name], [name] NO.:  [id number]   MEDICAL RECORD NO.:  [id number]                   PATIENT TYPE:  EMS   LOCATION:  [facility]                                 FACILITY:  [facility]   PHYSICIAN:  Candyce Churn, M.D.          DATE OF BIRTH:  02-12-1946   DATE OF CONSULTATION:  11/11/2002  DATE OF DISCHARGE:  11/11/2002                                   CONSULTATION   FINDINGS:  1. Chest pain, almost certainly noncardiac, likely secondary to esophageal     spasm from acid irritation from nausea and vomiting.  2. Mild gastroenteritis secondary to either bad food or virus,  improved.  3. History of coronary artery disease with multiple cardiac procedures     including:     A. Percutaneous transluminal coronary angioplasty of the left anterior        descending artery in 1998 by Dr. Lacretia Nicks. Viann Fish.     B. Repeat percutaneous transluminal coronary angioplasty in 1998 of the        left anterior descending artery and a diagonal.     C. In April 2000, stent to the mid right coronary as well as a        percutaneous transluminal coronary angioplasty of a diagonal to the        left anterior descending artery.     D. In October 2000, right coronary artery vasospasm, responded to        intracoronary nitroglycerin.     E. In June 2001, diffuse coronary artery spasm of the large obtuse        marginal, again responding to intracoronary nitroglycerin.     F. In June 2003, diagnostic catheterization revealed 50% stenosis of the        origin of the first diagonal, again spasm noted in the right coronary        artery, which again responded to intracoronary nitroglycerin.  4. Recent hospitalization where she was ruled out for ischemic heart disease     just 1 week ago. Chest pain syndrome at the time.  5. History of esophageal spasm noted on esophageal manometry in the past     with gastroesophageal reflux disease.  6. Hypertension.  7. Past history of  tobacco abuse.   DISCHARGE MEDICATIONS:  1. Imdur 120 mg a day.  2. Norvasc 7.5 mg a day.  3. Sublingual nitroglycerin p.r.n.  4. Prevacid 30 mg a day.  5. Celexa 20 mg daily.  6. Lipitor 10 mg a day.  7. Estrace 1 mg a day.  8. Aspirin 325 mg a day.  9. Topimax 1 daily.  10.      Ultram 50 mg p.r.n.  11.      Clonazepam 0.5 mg b.i.d.   HISTORY OF PRESENT ILLNESS:  The patient was  having  her hair fixed this  morning after having  not eaten any breakfast, and suddenly developed  nausea  and vomiting. She seemed to vomit food from the night before. She had not  had anything to eat this morning, no hematemesis. She said she had several  loose stools. After the vomiting she developed some chest pain which was  quite severe and she came to the emergency room at [facility] for  further evaluation.   The scale of her pain was a 9 to 10. It was radiating to her back. It did  not radiate into her jaw. She did have some pain within both arms that was  tingly an numb.   PHYSICAL EXAMINATION:  GENERAL:  Evaluation in the emergency room revealed a  relatively unremarkable examination.  VITAL SIGNS:  Temperature 97.6, blood pressure 131/60, pulse 58 and regular,  respirations 16 and easy, O2 saturation 99% on room air.  CHEST:  Clear to auscultation.  CARDIAC:  Regular rhythm without murmur or gallop. There was no palpable  chest pain.  ABDOMEN:  Soft and nontender. Normal bowel sounds.  EXTREMITIES:  No cyanosis, clubbing or edema.   LABORATORY DATA:  An EKG had sinus bradycardia at 55. Old septal infarct  noted. No ST elevation or depression noted.   Normal D-dimers at less than  0.22 microgram/mL with normal up to 0.48.  Sodium 138, potassium 4.4, chloride 107, BUN 7, glucose 97, bicarbonate 28,  hemoglobin 16, hematocrit 47. CPK 142, CK-MB 2.5, relative index 1.8,  troponin I 0.04, within normal limits.   ASSESSMENT AND PLAN:  The patient continued to complain of pain  that did not  respond to multiple medications including Toradol, nitroglycerin, morphine  and Dilaudid. It finally slowly resolved over time. There was no diaphoresis  or shortness of breath with this pain.   At the time of my examination at approximately 6:15 p.m. she was chest pain  free and her examination was completely normal as described. It was felt  that she likely had gastritis versus gastroenteritis that caused nausea and  vomiting. This in turn likely caused esophageal irritation and spasm with  resulting pain in the mid chest to her mid back not radiating to her jaw,  not associated with shortness of breath or diaphoresis.   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 She  apparently is going to have an MRI of the neck soon.   The patient was discharged to home pain free and she was given a GI cocktail  prior to  leaving. Her husband is going to drive her home. She was told that  the GI cocktail could certainly cause her to be somewhat drowsy. She was  alert and oriented during my entire history and examination.   She agreed with the current therapy and medical changes were to start taking  her Prevacid before bedtime and to add a Pepcid AC with breakfast. Will have  Dr. Venita Sheffield nurse contact her on November 14, 2002, for an appointment that day  and she should stay out of  work until seen by Dr. Donette Larry until November 14, 2002.                                               Maryln Gottron  Kevan Ny, M.D.    RNG/MEDQ  D:  11/11/2002  T:  11/14/2002  Job:  [id number]   cc:   Georgann Housekeeper, M.D.  [address]  [place]  [place] [address]  Fax: [phone]

## 2010-12-27 NOTE — H&P (Signed)
[place]. [facility]  Patient:    [name], [name]                        MRN: [id number] Adm. Date:  [id number] Attending:  Lum Babe Dictator:   Lum Babe, M.D.                         History and Physical  HISTORY OF PRESENT ILLNESS:  The patient is a 65 year old black female admitted to the hospital with chest pain.  She has a history of coronary artery disease, percutaneous transluminal coronary angioplasty of the left anterior descending artery about 1993, percutaneous transluminal coronary angioplasty and anterolateral stent to the right coronary artery, and proximal rotablator of the left anterior descending artery.  Last catheterization 8/00, ejection fraction over 60%, 2+ MR, LAD 30%, AL1 50%, circumflex 30%.  She has been maintained on medication since, and was doing well until this morning.  CURRENT MEDICATIONS: 1. Imdur 120 mg q.d. 2. Cardizem CD 240 mg b.i.d. 3. Aspirin q.d. 4. Prevacid 30 mg b.i.d. 5. Estrace 1 mg q.d. 6. Nitroglycerin 0.4 mg sublingual p.r.n.  Recently, she has been having trouble with shortness of breath, states she has been unable to lay flat.  Says she could not climb but four steps and got short of breath.  She had been having some rectal bleeding, but hemoglobin was 14.  Plans were to do a stress echocardiogram.  However, this morning the patient awoke about 4 oclock with intrascapular pain going to anterior chest. She had sweating with it and some nausea.  It lasted 30 minutes.  She took only one nitroglycerin, eventually went away, and then she had recurrent pain lasting about 20 minutes, then came to the office free of pain.  FAMILY HISTORY:  Father died at 26 with leukemia.  Mother is 28 with coronary artery disease.  One brother with hypertension.  One sister of stroke.  PERSONAL HISTORY:  Weight not recorded today, last recorded weight 120.  Usual 107, maximum 120.  Smoking 1 pack would last 1-1/2  weeks.  Alcohol 2 beers a night.  SOCIAL HISTORY:  Coffee 1, tea 4-5, diet low fat.  Divorced, has a fiance. Three children.  REVIEW OF SYSTEMS:  General.  No chronic diseases, childhood diseases usual.  PAST SURGICAL HISTORY: 1. Hysterectomy. 2. Arthroscopic surgery, left knee. 3. Laceration of left ear repaired. 4. Right breast biopsy.  INJURIES:  Laceration of left ear.  ALLERGIES:  No known drug allergies.  SKIN:  Break outs occasionally.  HEAD:  Headaches occasional.  ENT:  Spectacles.  NECK:  Negative.  RESPIRATORY:  Pneumonia in the past.  CARDIAC:  See present illness.  GASTROINTESTINAL:  Bright red blood per rectum, that has stopped.  GASTROURINARY:  Negative.  MENSES:  Hysterectomy (3-0-0-3, largest 5 pounds 2 ounces).  BONES AND JOINT:  Knees and fingers may hurt.  CENTRAL NERVOUS SYSTEM:  Negative.  MISCELLANEOUS:  Negative.  PHYSICAL EXAMINATION:  GENERAL:  A well-developed white female in no distress at this time.  VITAL SIGNS:  Pulse regular at 50, blood pressure is 110/70 right arm seated.  NECK:  Lymph nodes:  No significant superficial lymphadenopathy.  Thyroid not enlarged, no bruits.  SKIN:  Negative.  HEENT:  Pupils are equal and reactive.  Optic fundi benign.  There is some nasal congestion.  BREASTS:  Not examined today, operative scar noted in upper quadrant, right  breast.  LUNGS:  Clear to auscultation and percussion.  HEART:  S1 and S2 normal without murmur or gallop.  ABDOMEN:  Liver, kidney, and spleen not felt, no masses.  EXTREMITIES:  Pulses intact without clubbing, cyanosis, or edema.  NEUROLOGIC:  Normal.  GENITALIA:  Deferred.  RECTAL:  Reveals brown stool, negative for alcohol or blood.  LABORATORY DATA:  Echocardiogram shows sinus bradycardia, otherwise normal.  IMPRESSION:  Chest pain, presumably unstable angina, with a history of both coronary artery spasm demonstrated on previous catheterization, and  fixed coronary artery disease.  It is hard to know the source of this discomfort, if it is strictly spasm it should now be relieved, however, we cannot rule out a fixed disease with a superimposed clock that has come and gone.  PLAN:  We will admit to the hospital to rule out infarction, and then most likely get a nuclear stress test rather than going directly to cardiac catheterization.  Dr. Corliss Marcus has cathed in the past, and will be contacted.   DD:  01/20/00 TD:  01/20/00 Job: [id number]

## 2010-12-27 NOTE — Discharge Summary (Signed)
NAME:  [name], ROCKHOLD NO.:  [id number]   MEDICAL RECORD NO.:  [id number]                   PATIENT TYPE:  INP   LOCATION:  3714                                 FACILITY:  [facility]   PHYSICIAN:  Lyn Records, M.D.                DATE OF BIRTH:  06/18/1946   DATE OF ADMISSION:  01/21/2004  DATE OF DISCHARGE:  01/24/2004                                 DISCHARGE SUMMARY   ADMISSION DIAGNOSES:  1. Chest pain, rule out myocardial infarction.  2. Coronary artery disease.     a. Status post percutaneous transluminal cardiac angioplasty to the left        anterior descending and diagonal.     b. Status post stent placement to the right coronary artery.     c. Status post remote Rotablator of the diagonal.  3. Known gastroesophageal reflux disease and esophageal spasm.  4. Hyperlipidemia.  5. Ongoing tobacco use.   DISCHARGE DIAGNOSES:  1. Chest pain, resolved.  Negative myocardial infarction by cardiac enzymes.     Normal Cardiolite study.  2. Gastroesophageal reflux disease.  3. Hyperlipidemia.  4. Ongoing tobacco use.   PROCEDURES:  1. Adenosine Cardiolite study January 22, 2004.  2. A 2-D echocardiogram January 23, 2004.   COMPLICATIONS:  None.   DISCHARGE STATUS:  Improved.   HISTORY OF PRESENT ILLNESS:  Please see complete H&P for details, but in  short, a 65 year old female with known CAD.  She had PTCA to the LAD in  1993, again in 1998, and a stent placed to the RCA in 2000.  After that, she  also had Rotablator of a diagonal.  She has documented coronary spasm in  multiple vessels and has had recurrent hospitalizations for the same.  She  continues to smoke.  Her last admission was August 2004 when she had  documented reflux and esophageal spasm.  She presented on January 21, 2004,  with complaint of midsternal chest discomfort which she described as a  pressure that was associated with diaphoresis and nausea.  She would take  sublingual  nitroglycerin for this pain that persisted throughout the day.  She finally presented to the emergency room.   For physical examination on admission, please see complete H&P for details,  but in short, vital signs were stable; she was afebrile.  EKG showed normal  sinus rhythm.  Physical exam was without any abnormalities.  Chest x-ray  showed bilateral basilar atelectasis with no acute process.  Admission CBC  showed mild anemia with hemoglobin 11.4, hematocrit 33.9.  PT and PTT were  within normal limits.  BNP was normal.  Admission cardiac enzymes were  normal.  Point-of-care markers were negative x 3 sets.   She was admitted to rule out MI with serial cardiac enzymes.  She was  started on IV nitroglycerin and IV heparin.   Repeat cardiac enzymes were all normal.  EKG showed nonspecific  ST and T  wave changes laterally.  No acute ischemic changes.  She continued to have  some mild substernal chest discomfort on day #2 of admission.  She finally  revealed that she had been off of her Norvasc for at least 5 months prior to  this admission secondary to hypotension after some neck surgery.  Her  Norvasc was just restarted on the day of admission.  She was on this for  documented coronary spasm.   She had an adenosine Cardiolite study done on January 22, 2004.  There was no  documented ischemia.  She did have evidence of defect in the lateral wall  with generalized hypokinesia and an EF of 35%.  CT scan was also done which  was negative for PE.  A 2-D echocardiogram was performed.  This was done on  January 24, 2004.  Dr. Mayford Knife actually interpreted the echocardiogram herself.  She estimated the EF to be 50% with only mild posterior hypokinesis.   She was without complaints on January 24, 2004, and she was discharged home  without incident.   DISCHARGE MEDICATIONS:  1. Lipitor 10 mg daily.  2. Norvasc 5 mg daily.  3. Prevacid 30 mg daily.  4. Aspirin 325 mg daily.  5. Nitroglycerin 0.4 mg  p.r.n.  6. Imdur as taken previously at home.  7. Ambien 10 mg at h. s. p.r.n.Marland Kitchen   She was instructed to maintain a low-salt, low-fat, low-cholesterol diet.  She has no restrictions in her activities.  She is to see Dr. Katrinka Blazing for  followup on Wednesday, February 21, 2004 at 9 a.m.      Adrian Saran, N.P.                        Lyn Records, M.D.    HB/MEDQ  D:  02/26/2004  T:  02/27/2004  Job:  [id number]

## 2010-12-27 NOTE — Consult Note (Signed)
NAME:  [name], [name]                       ACCOUNT NO.:  [id number]   MEDICAL RECORD NO.:  [id number]                   PATIENT TYPE:  INP   LOCATION:  2901                                 FACILITY:  [facility]   PHYSICIAN:  Althea Grimmer. Luther Parody, M.D.            DATE OF BIRTH:  03/12/1946   DATE OF CONSULTATION:  03/17/2003  DATE OF DISCHARGE:                                   CONSULTATION   GASTROENTEROLOGY CONSULTATION   HISTORY OF PRESENT ILLNESS:  The patient is a 65 year old female whom I am  asked to see for apparent non-cardiac chest pain.  She presented to the  hospital yesterday complaining of sharp retrosternal chest discomfort,  radiating into her back, which awakens her from sleep at night and is  associated with nausea and regurgitation.  She says the problem actually has  been worsening for the past three to four weeks, though she has a history  that goes back at least three years of a very similar problem.  She  apparently was unrelieved by nitroglycerin and minimally helped by a GI  cocktail.  She has had emesis yesterday several times and today once, though  she did eat lunch today without difficulty.  She says she is swallowing food  and pills without a problem.  Apparently the cardiologists feel  electrocardiogram changes and cardiac enzymes are not reflective of coronary  ischemia.  The patient was extensively evaluated for this problem in 2001  and 2002. She had esophageal Manometry that demonstrated 40% non peristaltic  contractions and was felt to be consistent with diffuse esophageal spasm.  She had an upper endoscopy that was unsuccessful because the patient could  not relax the base of her tongue and swallow the scope.  For this reason she  was evaluated by Dr. Ezzard Standing, ENT, and laryngoscopy and evaluation of the  hypopharynx was normal.  She also underwent upper gastrointestinal series  that demonstrated tertiary contractions of the esophagus and a small  hiatal  hernia but was otherwise normal.  In addition, she underwent the designated  barium swallow and did not demonstrate any proximal esophageal  abnormalities.  In addition to that she had an upper abdominal sonogram in  February 2002 which did not demonstrate any biliary disease or gallstones.  She says the problem seems to have worsened gradually over the past few  years. She has had a recent 5 pound weight loss.  She does take daily  aspirin as well as Bextra  but she has not had any melena or hematochezia.  She has been on Prevacid at home and actually for a while took twice daily  without any apparent change in symptoms.   PAST MEDICAL HISTORY:  1. Pertinent for hiatal hernia and diffuse esophageal spasm.  2. Hypertension.  3. Coronary artery disease.  She is status post myocardial infarction,     angioplasty X3, stent placement X1. 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  4. Carpal tunnel syndrome.   CURRENT MEDICATIONS:  1. Carafate slurry.  2. Bextra 10 mg at home.  3. Ecotrin one per day.  4. Protonix in hospital, Prevacid at home.  5. Lipitor 10 mg daily.  6. Norvasc 7.5 mg daily.  7. Celexa 20 mg daily.  8. Isosorbide mononitrates 120 mg daily.   ALLERGIES:  None reported.   FAMILY HISTORY:  Negative for colorectal cancer or other gastrointestinal  disease.   SOCIAL HISTORY:  Recently remarried with three children.  Just quit smoking.  Has three beers several days a week.   REVIEW OF SYMPTOMS:  GENERAL:  A five pound weight loss.  No night sweats.  ENDOCRINE: No known history of diabetes or thyroid problems.  SKIN:  No rash  or pruritus.  EYES:  No icterus or change in vision. ENT:  No aphthous  ulcers or chronic sore throat.  RESPIRATORY:  No shortness of breath, cough  or wheezing.  CARDIAC:  As above. GASTROINTESTINAL:  As above.  GENITOURINARY:  No dysuria or hematuria.  The remainder of the review of   systems is negative.   PHYSICAL EXAMINATION:  GENERAL:  On examination she is a well-developed,  thin adult female in no acute distress.  VITAL SIGNS:  She is afebrile.  Blood pressure 92/48. Pulse 58 and regular.  SKIN:  Normal.  HEENT:  Eyes anicteric.  Oropharynx unremarkable.  NECK:  Supple without thyromegaly.  There is no cervical or inguinal  adenopathy.  CHEST:  Clear.  HEART:  Heart sounds regular rate and rhythm without murmurs, rubs or  gallops.  ABDOMEN:  Soft without mass, tenderness, organomegaly.  There is no rebound  or hernia.  Bowel sounds are normal.  Rectal examination is not performed.  EXTREMITIES:  Without cyanosis, clubbing or edema or rash.   LABORATORY DATA:  Revealed hemoglobin 11.4, white blood cell count 6.7.  Potassium 3.3.  Transaminases are normal.  Chest x-ray is normal.   IMPRESSION:  65 year old female with apparent non-cardiac chest pain related  to esophageal spasm.  She may also have some degree of reflux related to her  hiatal hernia.  In light of her recent unsuccessful  esophagogastroduodenoscopy attempt in the past few years and the extensive  radiographic and other work up I do not necessarily feel anything needs to  be done at this time, at least not emergently.  If problems persist we could  consider attempting an upper endoscopy again to assess her degree of reflux  or perhaps review another barium swallow to see if there have been any other  changes and to further document spasm.  She probably should be on a proton  pump inhibitor twice daily for a prolonged period of time to see if this  helps her.  Esophageal spasm is extremely difficult to treat. She already is  on nitrates;  consideration could be given to adding further calcium  channel blockers or using either of these right before a meal.  Further  anticholinergic, antispasmodics such as Nu-Lev could also be tried.  If the patient remains stable, therapeutic trials of the above  medications and any  further evaluation could take place as an outpatient.  Althea Grimmer. Luther Parody, M.D.    PJS/MEDQ  D:  03/17/2003  T:  03/17/2003  Job:  [id number]   cc:   Danise Edge, M.D.  [address]  [place]  [place] [address]  Fax: [phone]   Georgann Housekeeper, M.D.  [address]  [place]  [place] [address]  Fax: [phone]

## 2010-12-27 NOTE — Op Note (Signed)
NAME:  [name], [name]                       ACCOUNT NO.:  [id number]   MEDICAL RECORD NO.:  [id number]                   PATIENT TYPE:  AMB   LOCATION:  DSC                                  FACILITY:  [facility]   PHYSICIAN:  Mila Homer. Sherlean Foot, M.D.              DATE OF BIRTH:  06-10-1946   DATE OF PROCEDURE:  02/08/2003  DATE OF DISCHARGE:                                 OPERATIVE REPORT   SURGEON:  Mila Homer. Sherlean Foot, M.D.   ASSISTANT:  Madilyn Fireman, P.A.-C.   ANESTHESIA:  General LMA.   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   OPERATION/PROCEDURE:  Left shoulder arthroscopy with subacromial  decompression and rotator cuff debridement.   INDICATIONS FOR PROCEDURE:  The patient is a 65 year old with MRI evidence  of small supraspinatus tearing and type 3 acromion.  Informed consent was obtained.   DESCRIPTION OF PROCEDURE:  The patient was placed supine and general LMA  anesthesia after an interscalene block in the preanesthesia holding area.  Left shoulder was prepped and draped in the usual sterile fashion and a  posterior portal was created with a #1 blade, blunt trocar and cannula.  Clinically hemiarthroscopy was normal.  I went into the subacromial space  from posterior and identified a lot of bursitis and a type 3 acromion.  I  then made a lot of direct lateral portal with #11 blade, blunt trocar and  cannula and performed a bursectomy and then performed an anterolateral  acromioplasty.  She had ligament release with the ArthroCare wand.  I then  debrided the rotator cuff free of all the bursa and evaluated.  Could not  find any tearing whatsoever.  I, therefore, finished the arthroscopic  portion of the case and extended the direct lateral portal just large enough  so I could fit my finger through the incision and directly palpated the  rotator cuff through a range of motion as well as checked the anterolateral  acromioplasty.  Could not palpate any tears that felt like they needed to be  reinforced.  I did not palpate any tears that needed to be reinforced and  the anterior lateral acromioplasty was adequate.  The CA ligament was  completely released.  I then closed with interrupted 0 Vicryls, Steri-Strips  and dressed with Adaptic, 4 x 4s, ABDs, two-inch silk tape and a sling.  The  patient tolerated the procedure well.  Complications - none.  Drains - none.  Estimated blood loss minimal.                                                Mila Homer. Sherlean Foot, M.D.    SDL/MEDQ  D:  02/08/2003  T:  02/08/2003  Job:  [id number]

## 2010-12-27 NOTE — H&P (Signed)
[name], [name] NO.:  [id number]   MEDICAL RECORD NO.:  [id number]          PATIENT TYPE:  EMS   LOCATION:  MAJO                         FACILITY:  [facility]   PHYSICIAN:  Georgann Housekeeper, MD      DATE OF BIRTH:  May 04, 1946   DATE OF ADMISSION:  11/28/2006  DATE OF DISCHARGE:                              HISTORY & PHYSICAL   PRIMARY CARE PHYSICIAN:  Dr. Georgann Housekeeper.   CHIEF COMPLAINT:  Chest pain.   HISTORY OF PRESENT ILLNESS:  The patient is a 65 year old white female  with past medical history of GERD and CAD, status post angioplasty and  stent x2, who presents to the emergency room after an episode of chest  pain.  She tells me that she has been previously well with no complaints  and no recent illness, when starting about 10 p.m. the night of April  18, she started having severe, strong chest pain.  It was described as  just under the left breast, nonradiating, but severe; however, she also  described it as sharp.  She had no associated shortness of breath, but  did have associated diaphoresis and nausea, and then she felt as if her  bowels were going to move and she started having diarrhea.  None of  these did anything to relieve her symptoms.  She took nitroglycerin x4,  but with no relief.  She came into the emergency room.  She was given  aspirin, IV morphine and started on a nitroglycerin drip, none which  improved her symptoms.  She currently says that she is still having  chest pain now, though she is quite calm and her heart rate drops to as  low as into the 40s.  She is otherwise doing okay.   REVIEW OF SYSTEMS:  She denies any headaches, visual changes or  dysphagia.  No palpitations.  No shortness of breath, wheeze or cough.  No abdominal pain.  No hematuria, dysuria, constipation or diarrhea.  No  focal extremity numbness, weakness or pain.  Her review of systems is  otherwise negative.   PAST MEDICAL HISTORY:  1. CAD, status post  angioplasty and stent.  2. Hypertension.  3. GERD.  4. Depression.   MEDICATIONS:  1. Lipitor 10 mg p.o. daily.  2. Imdur 120 mg p.o. daily.  3. Lexapro 20 mg p.o. daily.  4. Klonopin 0.5 mg p.o. b.i.d.  5. Prevacid 30 mg p.o. daily.  6. Cartia XT 240 mg p.o. daily.   ALLERGIES:  She has no known drug allergies.   SOCIAL HISTORY:  She smokes a half a pack of cigarettes a day.  She  denies any drug or alcohol use.   FAMILY HISTORY:  Noncontributory.   PHYSICAL EXAMINATION:  VITALS ON ADMISSION:  Temperature -- she is  afebrile, heart rate currently 45, blood pressure 111/71, respirations  16, O2 SAT 100% on 2 L.  GENERAL:  The patient is alert and oriented x3 in no apparent distress.  HEENT:  Normocephalic, atraumatic.  Her mucous membranes are moist.  NECK:  She has no carotid bruits.  HEART:  Regular  rhythm.  Mild bradycardia.  LUNGS:  Clear to auscultation bilaterally.  ABDOMEN:  Soft, nontender and non-distended.  Positive bowel sounds.  EXTREMITIES:  No clubbing, cyanosis or edema.   LABORATORY WORK:  Lipase 29.  White count 6.8, H&H 12.3 and 36.1, MCV of  88, platelet count 297,000, no shift.  Sodium 140, potassium 3.5,  chloride 109, bicarb 24, BUN 8, creatinine 0.8, glucose 80.  D-dimer  0.27.  CPK 58.3, MB 1.4, troponin I less than 0.05.  LFTs are  unremarkable, except for the slightly low albumin of 3.4.   The patient's EKG is not on the chart, but has been read by ER doctor as  normal sinus rhythm; however, I do not have this in front of me.   ASSESSMENT AND PLAN:  1. Chest pain:  Despite the patient's extensive past medical history,      this is extremely atypical.  She herself was not able to      distinguish the chest pain between this and when her reflux flares      up.  We will plan to admit the patient, check 2 more sets of serial      enzymes for a total set of 3.  We will also give her a      gastrointestinal cocktail.  At this time I am hesitant to       necessarily get a repeat stress test and if her enzymes are      negative, wound consider the possibility of an outpatient stress      test if her chest pain improves.  2. Hypertension, currently stable:  Continue medications.  3. Bradycardia:  Likely, this is a combination of the patient's      Klonopin and Cartia.  We will hold her Cartia until her heart rate      improves.  4. Gastroesophageal reflux disease:  Continue Prevacid.  Try      gastrointestinal cocktail.  5. Hyperlipidemia:  Continue Lipitor.  6. Tobacco abuse:  Provide counseling.      Hollice Espy, M.D.   Electronically Signed     ______________________________  Georgann Housekeeper, MD    SKK/MEDQ  D:  11/28/2006  T:  11/28/2006  Job:  [id number]

## 2010-12-27 NOTE — Discharge Summary (Signed)
[place]  Patient:    [name], [name]                        MRN: [id number] Adm. Date:  [id number] Disc. Date: [id number] Attending:  Lum Babe                           Discharge Summary  HISTORY OF PRESENT ILLNESS:  A 65 year old female admitted to the hospital for chest pain.  She has both known fixed coronary artery disease and spasm.  She is suspected of having esophageal spasm as well.  There is no evidence for infarction.  HOSPITAL COURSE:  She was taken to the cardiac catheterization laboratory where studies revealed the presence of significant coronary artery spasm. There was diffuse spasm of the left coronary artery and the right coronary artery, which was mild compared to moderate left coronary artery.  These responded to nitroglycerin given intravenously.  There was no significant fixed obstructive coronary artery disease.  She was discharged on January 21, 2000.  LABORATORY DATA:  Hemoglobin 12.7 g%, hematocrit 36.7, white count 7400, and the platelet count was normal.  The PTT was prolonged with the use of heparin, serial cardiac enzymes were negative with both CK-MB and troponin I.  The electrocardiography showed nonspecific T-wave changes.  There was first degree AV block.  Sodium 137, potassium 3.8, chloride 105, CO2 26, nonfasting sugar 129.  Other values were normal, except for albumin slightly low at 3.2.  The admission PT and PTT were normal before the use of heparin.  The differential revealed 69 polys, 25 lymphs, 3 monocytes, 1 eosinophil, 0 basophils, and 2 large unidentified cells.  HOSPITAL COURSE:  The patient was placed on heparin and nitroglycerin intravenously with control of symptoms.  She was seen in consultation by Francisca December, M.D., who took her to the catheterization laboratory on January 21, 2000.  She received intracoronary nitroglycerin 0.15 in the left coronary artery and 0.1 x 2 in the right coronary  artery.  The LV was normal at 61% ejection fraction.  Diffuse left coronary artery spasm moderately relieved by intracoronary nitroglycerin.  Diffuse right coronary artery spasm, mild, relieved by intracoronary nitroglycerin.  No significant fixed obstructive disease.  The patients medications were changed.  Imdur was increased to 120 mg in the morning and 60 mg in the afternoon.  The current calcium channel blocker was switched to Norvasc 5 mg daily with plan to go to 10 mg daily if needed.  DISCHARGE DIAGNOSIS:  Coronary artery spasm.  DISCHARGE MEDICATIONS: 1. Imdur 120 mg each morning and 60 mg in the evening. 2. Discontinue current calcium channel blocker and start Norvasc 5 mg a day. 3. Continue Prevacid 30 mg daily. 4. Enteric-coated aspirin 325 mg a day. 5. Estrace 1 mg a day. 6. Nitroglycerin 0.4 mg sublingual p.r.n.  ACTIVITY:  Nothing vigorous for two days.  No work for two days.  DIET:  Low fat.  WOUND CARE:  Not applicable.  SPECIAL INSTRUCTIONS:  None.  FOLLOW-UP:  Return visit to physician in two weeks.  CONDITION ON DISCHARGE:  Improved. DD:  01/22/00 TD:  01/24/00 Job: [id number]

## 2010-12-27 NOTE — Cardiovascular Report (Signed)
NAMERAILYNN, BALLO NO.:  [id number]   MEDICAL RECORD NO.:  [id number]          PATIENT TYPE:  INP   LOCATION:  2017                         FACILITY:  [facility]   PHYSICIAN:  Lyn Records, M.D.   DATE OF BIRTH:  04-Aug-1946   DATE OF PROCEDURE:  02/19/2006  DATE OF DISCHARGE:                              CARDIAC CATHETERIZATION   INDICATION FOR PROCEDURE:  Prolonged chest discomfort of anginal quality.  The patient has a long history of both obstructive and vasomotor related  myocardial ischemia/coronary artery induced ischemia.  She was admitted last  night with prolonged chest discomfort compatible with angina.  Her markers  were negative.  There were no EKG changes.  She does have a stent in the  right coronary.  She is undergoing this procedure to define coronary  anatomy.  Last cardiac catheterization was 4 years ago.   PROCEDURE PERFORMED:  1.  Left heart catheterization.  2.  Selective coronary angiography.  3.  Left ventriculography.  4.  Intracoronary nitroglycerin administration.   DESCRIPTION:  After informed consent a 6-French sheath was placed in the  right femoral artery using modified Seldinger technique.  A 6-French A2  multipurpose catheter was then used for hemodynamic recordings, left  ventriculography by hand injection, and selective left and right coronary  angiography.  100 mcg of intracoronary nitroglycerin was administered into  the left coronary demonstrating resolution of a moderately severe lesion in  the first obtuse marginal after intracoronary administration.  Following the  procedure Angio-Seal arteriotomy closure was performed with good hemostasis  and no complications.   RESULTS:  1.  Hemodynamic data:      1.  Aortic pressure 89/40.      2.  Left ventricular pressure 93/10  2.  Left ventriculography:  Left ventricular cavity size and systolic      function are normal.  EF is 60%.  3.  Coronary angiography.      1.  Left  main coronary:  Widely patent.      2.  Left anterior descending coronary:  LAD is large vessel initially          with significant generalized spasm.  After intracoronary          nitroglycerin LAD increased in diameter by 100%.  There is mid and          proximal 30-40% narrowing.  The first diagonal contains ostial 70%          narrowing.  No high-grade obstruction is felt to be present in LAD.      3.  Circumflex artery:  Circumflex coronary artery prior to          intracoronary nitroglycerin contained a 70-80% proximal/ostial OM          #1.  Following intracoronary nitroglycerin circumflex was          demonstrated be totally normal.      4.  Right coronary:  Right coronary has midvessel 30-50% narrowing. The          vessel was basically widely patent without high grade obstruction  being present.  The stent in the right coronary is widely patent.   CONCLUSION:  1.  Widely patent coronary arteries.  Significant stenosis due to coronary      artery spasm in the first obtuse marginal was relieved after      intracoronary nitroglycerin.  Patent stent in the right coronary.  2.  Normal LV function.  3.  The patient's clinical presentation is probably compatible with      diffuse/generalized coronary artery spasm relieved by intracoronary      nitroglycerin and IV nitroglycerin.   PLAN:  If no difficulty with hemostatic plug that was placed, the patient  will ambulate and hopefully be discharged later today.      Lyn Records, M.D.  Electronically Signed     HWS/MEDQ  D:  02/19/2006  T:  02/19/2006  Job:  [id number]   cc:   Georgann Housekeeper, MD  Fax: [phone]

## 2010-12-27 NOTE — H&P (Signed)
NAME:  [name], [name] NO.:  [id number]   MEDICAL RECORD NO.:  [id number]                   PATIENT TYPE:  INP   LOCATION:  1826                                 FACILITY:  [facility]   PHYSICIAN:  Lyn Records, M.D.                DATE OF BIRTH:  [birth year]   DATE OF ADMISSION:  03/16/2003  DATE OF DISCHARGE:                                HISTORY & PHYSICAL   ADMISSION DIAGNOSES:  1. Unstable angina; rule out myocardial infarction.  2. Coronary disease.  3. Hypertension.  4. Hyperlipidemia.   CHIEF COMPLAINT:  Chest pain since 3:00 o'clock this morning.   HISTORY OF PRESENT ILLNESS:  This is a 65 year old African American female  with a history of coronary artery disease, hypertension, and hyperlipidemia.  Ongoing tobacco use. The patient was awakened out of sleep this morning  around 3:00 a.m. with sharp, throbbing substernal chest pain, which radiated  through to the back. She had four episodes of emesis. No hematemesis. No  shortness of breath but did continue to feel nauseated. Took one sublingual  nitroglycerin secondary to 10/10 chest pain and tried to lay down but could  not. Broke out in a sweat. Took a total of three nitroglycerin without  relief. Said she was doubled over in pain. Her husband brought her to the  emergency room.   At the emergency room, she was given a GI cocktail without relief. The pain  is still a 10/10. EKG revealed T-wave inversion in 1, aVL, and the lateral V  leads. She was then given IV morphine x4 mg and IV Zofran x2 mg without much  change in her symptoms, maybe to an 8/10. I have spoken with Dr. Katrinka Blazing about  this patient and he is going to evaluate her and see if she needs urgent  catheterization. Cardiac enzymes have been negative so far.   She does say that yesterday she had bilateral shoulder and arm discomfort  and some intermittent lightheadedness but thought this was secondary to  shoulder surgery that  she had had done one month ago.   ALLERGIES:  No known drug allergies. Denies allergy to shellfish or iodine.   CURRENT MEDICATIONS:  1. Lipitor 10 mg.  2. Occasional aspirin 325 mg.  3. Bextra 10 mg daily.  4. Nitroglycerin as needed.  5. Norvasc 7.5 mg.  6. Celexa 20 mg at bedtime.  7. Prevacid 30 mg.  8. Isosorbide 120 mg daily.   PAST MEDICAL HISTORY:  1. LAD PTCA 1998.  2. LAD/diagonal PTCA 1998.  3. RCA stenting, PTCA diagonal 2000.  4. RCA vasospasm treated with intracoronary nitroglycerin October 2000.  5. Coronary vasospasm of the OM, treated with intracoronary nitroglycerin in     2000.  6. RCA vasospasm treated with intracoronary nitroglycerin June 2003.  7. Cardiac catheterization November 2003, revealing coronary vasospasm.  8. Shoulder surgery, bilateral, about one month ago.  9.  GERD.  10.      Hypertension.  11.      Hyperlipidemia.  12.      Ongoing tobacco use.   FAMILY HISTORY:  Mother alive, has heart disease and leukemia. Father  deceased, had leukemia. Two siblings living, one with hypertension and one  has had a stroke.   SOCIAL HISTORY:  Married with three children. Grandmother of six. She is  currently on medical leave. Was a Scientist, water quality. She quit smoking about two  weeks ago. Prior to that, she smoked basically a pack a day for 40+ years.  Drinks three beers a few times a week. No illicit drug use.   REVIEW OF SYSTEMS:  Denies fevers, chills, cough, cold, or congestion. No  indigestion, heart burn, melena, hematuria, or dysuria. No edema or  claudication. She did feel lightheaded yesterday. No dizziness or syncope.   PHYSICAL EXAMINATION:  VITAL SIGNS:  Blood pressure currently 92/52 after  two nitroglycerin, pulse 73, respirations 20, SAO2 100% on 2 L.  GENERAL:  The patient is alert and oriented x3, in no acute distress. She is  very quiet and keeps her eyes closed.  HEENT:  Normocephalic, atraumatic. PERRL. EOMI.  NECK:  Supple supple.  Without bruits or masses.  LUNGS:  Clear to auscultation without wheezing or crackles. Symmetric  excursion.  COR:  Regular rate and rhythm, without murmur, gallop or rub. Normal S1 and  S2.  ABDOMEN:  Soft, nontender, nondistended. Normal bowel sounds.  No bruits.  EXTREMITIES:  Distal pulses 2+ bilaterally without edema.  NEUROLOGICAL:  Nonfocal.  Clear mentation test.  SKIN:  Without obvious lesions.   LABORATORY DATA:  Pending.  EKG shows sinus rhythm with T-wave inversions in  1, aVL and the lateral V leads.  Chest x-ray pending. Verbal report from the  nurse states that initial cardiac enzymes have been negative.   IMPRESSION:  1. Unstable angina; rule out myocardial infarction.  2. Coronary artery disease.  3. Coronary vasospasm.  4. Hyperlipidemia.  5. Hypertension.  6. Ongoing tobacco use.  7. Gastroesophageal reflux disease.    PLAN:  Admit. IV heparin and IV nitroglycerin. Cycle enzymes. Check on  laboratories. Consider urgent catheterization. Smoking cessation  consultation. Continuation of home medications.      Luan Moore, P.A.-C                Lyn Records, M.D.    TCJ/MEDQ  D:  03/16/2003  T:  03/16/2003  Job:  [id number]   cc:   Georgann Housekeeper, M.D.  [address]  [place]  [place] [address]  Fax: [phone]

## 2010-12-27 NOTE — Discharge Summary (Signed)
[place]  Patient:    [name], [name]                        MRN: [id number] Adm. Date:  [id number] Disc. Date: [id number] Attending:  Lyn Records. Iii Dictator:   Anselm Lis, N.P. CC:         Tyson Dense, M.D.   Discharge Summary  DISCHARGE DIAGNOSES: 1. Symptoms of unstable angina pectoris in this 65 year old with history of    coronary artery spasm, but also known coronary artery disease with past    percutaneous transluminal coronary angioplasty of the left anterior    descending and diagonal and stenting of mid right coronary artery.  On February 04, 2001, she underwent coronary angiography revealing normal left    ventricle size and function with ejection fraction greater than 60% and 50%    origin of diagonal-1. Right coronary artery with initial mild generalized    vasoconstriction which markedly improved after endocoronary nitroglycerin.    There was only a residual of 50% proximal mid right coronary artery stent    narrowing.  The patients electrocardiogram had shown new inferior and    lateral precordial T wave inversions without ST changes as compared to    initial electrocardiogram on presentation to the emergency room.  Her peak    troponin I was slightly elevated at 0.06. 2. History of diffuse esophageal spasm documented by past esophageal    manometry.  Upper gastrointestinal  February 2002, revealed small sliding    hiatal hernia without reflux.  Tertiary contractions of esophagus with no    peptic ulcer disease.  She does have a history of gastroesophageal reflux    disease.  Prior esophagogastroduodenoscopy attempted by Dr. Danise Edge    was unsuccessful secondary to inability to pass the scope in the posterior    pharynx. 3. Ongoing tobacco abuse. 4. History of syncope, thought vasovagal in setting of the headache. 5. Questionable history of asthma. 6. History of hysterectomy with bilateral  salpingo-oophorectomy. 7. Arthroscopic left knee surgery. 8. Left breast biopsy.  CONDITION ON DISCHARGE:  The patient was discharged home in stable condition.  DISCHARGE MEDICATIONS: 1. (New) Librax 5/2.5 one tablet before meals and at bedtime up to four    tablets per day. 2. Imdur 120 mg p.o. q.d. 3. Estrace 1 mg p.o. q.d. 4. Lipitor 1 p.o. q.d., dose as before. 5. Norvasc with daily dose as before. 6. Nitroglycerin tablet 0.4 mg sublingual p.r.n. chest pain.  DIET:  Low fat, low cholesterol.  SPECIAL INSTRUCTIONS:  Call our clinic if she develops a large amount of groin bruising or bleeding.  Stop smoking.  No ranitidine, Protonix or other GI medications until after follow up with gastroenterology on July 8.  No aspirin until after that test.  FOLLOWUP:  Follow up with Dr. Verdis Prime on Friday, July 19, at 10:15 a.m.  LABORATORY DATA AND X-RAY FINDINGS:  WBC 6.6, hemoglobin 14, hematocrit 41, platelets 261.  Admission coagulations were within normal range.  Sodium 143, potassium 3.8, glucose 110, BUN 8, creatinine 0.9.  First CK of 113 with MB fraction 1.4, troponin I 0.01 seconds; second CK of 110, MB fraction 2.1, troponin I 0.06; third CK of 97 with MB fraction 1.1, troponin I 0.04.  Lipid profile with cholesterol 142, triglycerides 76, HDL 79, LDL 48.  Chest x-ray revealed no active disease.  HISTORY OF PRESENT ILLNESS:  Ms.  [name] is a pleasant 65 year old female with known history of coronary artery spasm and esophageal spasm with GERD.  She also has ongoing tobacco abuse with known history of coronary artery disease with past stent to the mid RCA and past PTCA of the LAD and diagonal.  On the morning of admission, she woke with lower abdominal, sharp, knotting discomfort which developed into lower epigastric and lower substernal discomfort with associated diaphoresis and nausea.  She took a total of approximately three sublingual nitroglycerin without change.  She  finally called EMS about 11 a.m. where additional sublingual nitroglycerin and baby aspirin were administered without appreciable change.  In [facility] ER, she received a total of three sublingual nitroglycerin, IV nitroglycerin and IV heparin, Pepcid and 4 mg of IV morphine as well as GI cocktail with some improvement.  Details of the rest of the hospital course as above.  Total time of preparing discharge was greater than 30 minutes including dictation of discharge summary and explanation of discharge orders to patient. DD:  02/17/01 TD:  02/17/01 Job: [id number]

## 2010-12-27 NOTE — Discharge Summary (Signed)
NAMEJONIQUA, [name] NO.:  [id number]   MEDICAL RECORD NO.:  [id number]          PATIENT TYPE:  INP   LOCATION:  2017                         FACILITY:  [facility]   PHYSICIAN:  Lyn Records, M.D.   DATE OF BIRTH:  [birth year]   DATE OF ADMISSION:  02/18/2006  DATE OF DISCHARGE:  02/19/2006                                 DISCHARGE SUMMARY   DATE OF ADMISSION:  February 18, 2006   DATE OF DISCHARGE:  February 19, 2006   DISCHARGE DIAGNOSES:  1.  Chest pain, resolved.  2.  Known coronary artery disease.  3.  Normal left ventricular function.  4.  Hyperlipidemia, treated.  5.  Long-term medication use.  6.  Polysubstance abuse.   HOSPITAL COURSE:  [name] is a 65 year old female admitted with chest  pain on February 18, 2006.  She has known coronary artery disease and has  undergone angioplasty/stent placements in the past, what she describes as 5-  10 years ago.  Over the past 2 weeks she has been having frequent chest  pain, associated with left arm radiation.  She had an appointment to see Dr.  Katrinka Blazing later this month, however, the pain worsened and she came to the  Emergency Room.  She had some nausea this evening with pain and she states  she blacked out and therefore EMS was called.   The patient was admitted to the hospital.  Her cardiac isoenzymes were  negative but she still complained of chest pressure.  Therefore, she was  taken to the Cardiac Catheterization Laboratory, where the following was  found by Dr. Katrinka Blazing:  Ejection fraction was 60% with normal left ventricular  function, the left main was OK, the LAD had a first diagonal with an 80%  ostial lesion.  There was a 40% proximal and mid-LAD lesion, circumflex OM1  80% which was relieved after IC nitroglycerin, the RCA had a had 50% mid-  lesion.  Dr. Katrinka Blazing felt the patient had coronary artery spasm in a  generalized fashion but was most severe in the OM1.  The patient was then  ready for discharge to  home on the same day on five medications:  Lipitor  one tablet prior to admission, Imdur one tablet daily, Celexa 20  mg a day, Prevacid one a day, enteric coated aspirin 325 mg a day, Norvasc  one tablet daily, sublingual nitroglycerin p.r.n. chest pain.  She has a  return appointment to see Dr. Katrinka Blazing on March 12, 2006 at 2:30 p.m.  Otherwise, she is not to drive for 2 days, no lifting more than 10 pounds  for one week, __________  call for recurrent chest pain.      Guy Franco, P.A.      Lyn Records, M.D.  Electronically Signed    LB/MEDQ  D:  02/19/2006  T:  02/20/2006  Job:  [id number]

## 2010-12-27 NOTE — Discharge Summary (Signed)
NAMECHRISY, HILLEBRAND NO.:  [id number]   MEDICAL RECORD NO.:  [id number]          PATIENT TYPE:  INP   LOCATION:  3710                         FACILITY:  [facility]   PHYSICIAN:  Georgann Housekeeper, MD      DATE OF BIRTH:  05-Sep-1945   DATE OF ADMISSION:  11/28/2006  DATE OF DISCHARGE:  11/30/2006                               DISCHARGE SUMMARY   DISCHARGE DIAGNOSES:  1. Chest pain atypical.  2. Esophageal dysmotility.  3. Severe coronary artery disease.  4. Depression.   DISCHARGE MEDICATIONS:  1. Aspirin 325 daily.  2. Prevacid 30 mg b.i.d.  3. Klonopin 0.5 two tablets _______.  4. Celexa 20 mg.  5. Lipitor 20 mg daily.  6. Cartia 240 mg daily.  7. Imdur 120 mg daily.  8. Diflucan 100 mg 1 a day for 7 days.   LABORATORY:  Chest x-ray negative.  Barium swallow, upper GI showed  esophageal dysmotility and no evidence of any stricture and acid reflux  disease with sliding hernia.   CONSULTATIONS:  GI:  As far as her laboratory data, cardiac markers are  negative.  Normal white count chemistries were normal.  D-dimer was  0.27.  Lipase and amylase was normal.  EKG normal.   HOSPITAL COURSE:  This is a 65 year old female with a history of GERD,  esophageal dysmotility, CAD, depression, admitted with chest discomfort,  epigastric.   PROBLEM:  Cardiac.  Patient was ruled out for myocardial infarction and  telemetry had no arrhythmias.  Thought to be noncardiac origin because  of her gastrointestinal.  Gastroenterology was consulted and they wanted  to hold off endoscopy.  Had barium swallow upper gastrointestinal test.  Showed esophageal dysmotility, sliding hiatal hernia, acid reflux  disease, and no stricture.  Patient's Prevacid was increased to twice a  day.  She continues to improve slowly.  On and off had some pain.  Patient had some thrush in the mouth and her Diflucan was started.  Continue  course for 7 days 100 mg ______ daily.  Will follow up with  outpatient  gastroenterology as well as myself, possibly endoscopy if needed.  She  had extensive workup of esophageal dysmotility in the past.   CONDITION ON DISCHARGE:  Stable.      Georgann Housekeeper, MD  Electronically Signed     KH/MEDQ  D:  12/31/2006  T:  12/31/2006  Job:  [id number]

## 2010-12-27 NOTE — Discharge Summary (Signed)
NAME:  [name], [name]                       ACCOUNT NO.:  [id number]   MEDICAL RECORD NO.:  [id number]                   PATIENT TYPE:  INP   LOCATION:  3704                                 FACILITY:  [facility]   PHYSICIAN:  Tara C. Jernejcic, P.A.             DATE OF BIRTH:  1946-07-28   DATE OF ADMISSION:  07/02/2002  DATE OF DISCHARGE:  07/04/2002                                 DISCHARGE SUMMARY   ADMISSION DIAGNOSIS:  1. Chest pain, rule out myocardial infarction.  2. Relative hypotension.  3. History of severe coronary artery spasm.  4. History of coronary disease with multiple percutaneous transluminal     coronary angioplasties in the past.  5. Ongoing tobacco use.  6. History of esophageal spasm.  7. Gastroesophageal reflux disease.  8. Ongoing tobacco abuse.   DISCHARGE DIAGNOSES:  1. Chest pain, resolved, myocardial infarction ruled out with negative     enzymes, cardiac catheterization revealing right coronary artery coronary     spasm and inferior wall hypokinesis.  2. Relative hypotension.  3. History of severe coronary artery spasm.  4. History of coronary disease with multiple percutaneous transluminal     coronary angioplasties in the past.  5. Ongoing tobacco use.  6. History of esophageal spasm.  7. Gastroesophageal reflux disease.  8. Ongoing tobacco abuse.   HISTORY OF PRESENT ILLNESS:  The patient is a 65 year old black female with  a history of coronary artery disease and coronary spasm, GERD and esophageal  spasm, as well as ongoing tobacco use.  She developed substernal chest  discomfort the morning of admission.  She took three nitroglycerins without  relief.  Discomfort radiated to the left arm.  In the emergency room, she  has had a severe episode of nausea, diaphoresis and diarrhea and she still  complains of severe chest pain.  There are no ischemic changes on EKG and  initial cardiac enzymes are negative.  However, the patient is hypotensive  with blood pressure of 96/51, heart rate 46 and therefore cannot give her  sublingual nitroglycerin or beta blockers secondary to low heart rate and  hypotension.  For this reason, the patient will be taken directly to the  catheterization lab by Dr. Deloris Ping. Nahser for evaluation of ongoing chest  pain in the setting of hypotension and bradycardia.   PROCEDURES:  Cardiac catheterization, July 02, 2002, by Dr. Elease Hashimoto.   COMPLICATIONS:  None.   CONSULTATIONS:  None.   COURSE IN THE HOSPITAL:  The patient was evaluated in the emergency room for  symptoms of unstable angina with negative EKG, negative enzymes and ongoing  chest pain, hypotension and bradycardia.   EKG on admission showed sinus bradycardia with Q waves in V1 and 2 but  otherwise nonacute.  Cardiac enzymes series were followed, CK 74, 66, 66; MB  1.7, 1.9, 2.0; troponin I 0.01, 0.04.   Total cholesterol 126, triglycerides 73, HDL 89 and  LDL 22.   Because the patient was experiencing ongoing severe chest pain with  hypotension and bradycardia, as mentioned above, she was taken directly to  the cardiac catheterization lab by Dr. Elease Hashimoto.  Of note, heparin was bolused  in the emergency room and then a drip was continued and maintained by  pharmacy.   Cardiac catheterization, July 02, 2002 by Dr. Elease Hashimoto revealed a left  main that is fairly smooth and normal, LAD moderate size with minor luminal  irregularities, small-to-moderate-sized diagonal with 30-40% stenosis in the  midportion without significant flow obstruction, circumflex with minor  luminal irregularities and RCA small to moderate size.  When engaged upon  with catheter, there was coronary spasm which resolved after intracoronary  nitroglycerin.  There was a midstenosis of 20% to 30% corresponding to a  previously placed stent.  There was no obstruction to flow.  Ventriculogram  revealed moderate HK of a small area of the inferior wall which was  probably  due to coronary spasm of the RCA; this happened in the catheterization lab.  Dr. Elease Hashimoto expected quick resolution of this and recommended to continued  current medications.   Patient remained stable post catheterization.  There were no problems, the  sheath pulled and the groin remained stable without hematoma or bruit.  Nitroglycerin was weaned off on July 03, 2002 and Imdur was resumed.  Medication adjustments were made.  Dr. Georgann Housekeeper was called to see the  patient regarding some left shoulder pain.  The patient had a mild anemia  prior to discharge with a hemoglobin of 10.2 and platelet count 197,000;  however, on admission, platelet count was 248,000 and hemoglobin was 12.5,  which were acceptable.   On July 04, 2002, the patient was felt stable for discharge from a  cardiovascular standpoint.  Smoking cessation was discussed.  The patient  was felt stable for discharge to home.   DISCHARGE MEDICATIONS:  1. Imdur 120 mg a day.  2. Norvasc 7.5 mg a day.  3. Nitroglycerin as needed for chest pain.  4. Prevacid 30 mg a day.  5. Celexa 20 mg a day.  6. Lipitor 10 mg a day.  7. Estrace 1 mg a day.  8. Aspirin 325 mg a day.  9. Topamax one a day.  10.      Ultram 50 mg a day as needed.  11.      Clonazepam 0.5 mg twice a day.   SPECIAL DISCHARGE INSTRUCTIONS:  She is to stop her migraine medication --  unless it is okay to continue with Dr. Katrinka Blazing -- as it may be contributing to  coronary spasm.   ACTIVITY:  No strenuous activity, lifting over 5 pounds, driving for two  days, then may resume as before.   DIET:  Low-fat, low-cholesterol, low-salt/no-added-salt diet.   WOUND CARE:  May shower.   FOLLOWUP:  She is to follow up with Dr. Katrinka Blazing on Friday, December 5th, at  9:45 in the morning.  She has an appointment to see Dr. Donette Larry about her shoulder and her hemoglobin on Monday, December 1st, at 4:15.                                                  Luan Moore, P.A.    TCJ/MEDQ  D:  08/29/2002  T:  08/30/2002  Job:  [id number]   cc:   Georgann Housekeeper, M.D.  [address]  [place]  [place] [address]  Fax: [phone]   Lyn Records III, M.D.  [address]  [place]  [place] [address]  Fax: [phone]

## 2010-12-27 NOTE — H&P (Signed)
NAME:  [name], [name]                       ACCOUNT NO.:  [id number]   MEDICAL RECORD NO.:  [id number]                   PATIENT TYPE:  INP   LOCATION:  3704                                 FACILITY:  [facility]   PHYSICIAN:  Armanda Magic, M.D.                  DATE OF BIRTH:  [birth year]   DATE OF ADMISSION:  07/02/2002  DATE OF DISCHARGE:                                HISTORY & PHYSICAL   CONSULTATIONS:  Vesta Mixer, M.D.   IMPRESSION:  (As dictated by Armanda Magic, M.D.)  1. Chest pain with no ischemic changes on electrocardiogram.  Initial set of     cardiac enzymes negative.  The patient is hypotensive and still complains     of chest pain at 7/10 substernal. Cannot give further sublingual nitrates     or beta blocker secondary to low heart rate and hypotension.  2. History of severe coronary artery spasm and coronary artery disease with     multiple percutaneous transluminal coronary angioplasty in the past.  3. History of esophageal spasm.  4. Ongoing tobacco abuse.   PLAN:  (As dictated by Armanda Magic, M.D.)  1. Emergency cardiac catheterization to evaluate for possibility of coronary     stenosis, Vesta Mixer, M.D.  2. Rule out MI with serial cardiac enzymes.  3. IV nitroglycerin drip.  4. IV heparin and bolus.   HISTORY OF PRESENT ILLNESS:  The patient is a 65 year old black female with  history of coronary artery disease and coronary spasm, GERD, and esophageal  spasm as well as ongoing tobacco abuse.  She developed substernal chest  discomfort on the morning of admission.  She took 3 sublingual nitrates  without relief.  The pain radiated to the left arm.  In the emergency room,  she had an episode of severe nausea, diaphoresis, and diarrhea.  Currently  she still has complaint of severe chest pain.   PAST MEDICAL HISTORY:  1. Coronary atherosclerotic heart disease.     a. (02/04/2002) diagnostic cardiac catheterization revealing 50% stenosis  origin of diagonal #1.  RCA vasospasm treated successfully with        intracoronary nitroglycerin.     b. (01/21/2000) diffuse coronary artery vasospasm especially in the large        obtuse marginal, resolved after intracoronary nitroglycerin.     c. (05/1999) right coronary artery vasospasm treated successfully with        intracoronary nitroglycerin.     d. (12/03/1998)  Percutaneous transluminal coronary angioplasty of the        diagonal of LAD.     e. (11/23/1998)  Stent mid RCA.     f. (11/1996) PTCA of the LAD and diagonal.     g. (1988) PTCA of the LAD (Dr. Donnie Aho).  2. History of esophageal spasm.     a. (09/2000) upper GI revealing small sliding hiatal hernia without  reflux, tertiary contractions of the esophagus with no peptic ulcer        disease.     b. Diffuse esophageal spasm by past esophageal manometry.     c. GERD.     d. Prior EGD was attempted by Dr. Danise Edge was unsuccessful        secondary to inability to pass the scope into the posterior pharynx.  3. Ongoing tobacco abuse.  4. History of syncope though vasovagal in the setting of headache.  5. Questionable history of asthma.  6. History of hysterectomy with BSO.  7. Arthroscopic left knee surgery.  8. Breast biopsy.  9. Left bone spur followed by Dr. Sherlean Foot.  Was told she needs surgery,     currently giving her a lot of pain.  10.      Arthritis bilateral TMs.   ALLERGIES:  No known drug allergies.  Okay with seafood, shellfish, and  iodine products.   MEDICATIONS:  1. Imdur 120 mg p.o. q.d.  2. Nitroglycerin tablets 0.4 mg sublingually p.r.n. chest pain, up to 2     tablets in 15 minutes.  3. Prevacid 30 mg p.o. q.d.  4. Celexa 20 mg p.o. q.d.  5. Lipitor 10 mg p.o. q.d.  6. Estrace 1 mg p.o. q.d.  7. Enteric-coated aspirin 325 mg once daily.  8. Topamax 1 tablet at bedtime.  9. Ultram 50 mg p.r.n.  10.      Clonazepam 0.5 mg b.i.d. and 1 mg at bedtime.   SOCIAL HISTORY:  Ongoing tobacco  use of one-half to one pack per day.  ETOH:  Social.  The patient works as a Loss adjuster, chartered at [employer].  She is  recently married.  She has three children.   FAMILY HISTORY:  Mother, age 79, positive CAD.  Father deceased at age 20,  leukemia without CAD, brother with hypertension, sister status post CVA.   REVIEW OF SYSTEMS:  As in HPI and Past Medical History, otherwise complains  of dyspnea on exertion, complains of migraine headache for greater than two  Weeks.  About a week earlier, she took pain medication 3 doses throughout  the day for severe migraine.  She feels this has not helped very much, and  she has not taken any subsequent doses.  Problems with GERD.  Bone spur left  shoulder causing her a lot of pain and discomfort was evaluated by Dr. Sherlean Foot  in the past.  He has recommended surgery.  She does wear corrective e  lenses.  She denies insomnia.   PHYSICAL EXAMINATION:  (As dictated by Armanda Magic, M.D.)  VITAL SIGNS:  Blood pressure 96/51, heart rate 46 and regular.  GENERAL:  She is a 65 year old, fatigued-appearing female with ongoing chest  discomfort.  NECK:  Brisk bilateral upstroke without bruit.  No JVD or thyromegaly.  CHEST:  Clear to auscultation without CVA tenderness.  CARDIAC:  Regular rate and rhythm without murmur, rub, or gallop.  Normal S1  and S2.  ABDOMEN:  Soft, nondistended.  Normoactive bowel sounds.  Negative abdominal  aortic, renal, femoral bruit.  Nontender to applied pressure.  No masses, no  organomegaly appreciated.  EXTREMITIES:  Distal pulses intact. Negative pedal edema.  NEUROLOGIC:  Grossly nonfocal.  Alert and oriented x 3.  GU/RECTAL:  Exams deferred.    LABORATORY DATA:  Sodium 142, potassium 4.2, chloride 110, CO2 29, BUN 12,  creatinine 0.9, glucose 94.  Hemoglobin 12.5, hematocrit 38.2, WBC 6.2,  platelets 248.  CK 74, MB fraction 1.7, troponin 0.01.   EKG reveals sinus bradycardia, old septal MI.  No acute ischemic  changes.     Salomon Fick, N.P.                       Armanda Magic, M.D.    MES/MEDQ  D:  07/04/2002  T:  07/04/2002  Job:  [id number]   cc:   Georgann Housekeeper, M.D.  [address]  [place]  [place] [address]  Fax: [phone]   Lyn Records III, M.D.  [address]  [place]  [place] [address]  Fax: [phone]

## 2010-12-27 NOTE — Op Note (Signed)
NAME:  [name], [name]                       ACCOUNT NO.:  [id number]   MEDICAL RECORD NO.:  [id number]                   PATIENT TYPE:  INP   LOCATION:  2899                                 FACILITY:  [facility]   PHYSICIAN:  Hewitt Shorts, M.D.            DATE OF BIRTH:  [birth year]   DATE OF PROCEDURE:  10/09/2003  DATE OF DISCHARGE:                                 OPERATIVE REPORT   PREOPERATIVE DIAGNOSES:  C5-6 cervical spondylosis, degenerative disk  disease and radiculopathy.   POSTOPERATIVE DIAGNOSES:  C5-6 cervical spondylosis, degenerative disk  disease and radiculopathy.   PROCEDURE:  C5-6 anterior cervical diskectomy arthrodesis with iliac crest  allograft and Trinica cervical plating.   SURGEON:  Hewitt Shorts, M.D.   ANESTHESIA:  General endotracheal.   INDICATIONS:  The patient is a 65 year old woman who presented with neck  pain and cervical radiculopathy who is ready to proceed with decompression  of spondylosis and degenerative disk disease.   PROCEDURE:  The patient is brought to the operating room and placed in the  supine position under general endotracheal anesthesia.  The patient had 10  pounds of Holter traction applied.  The neck was prepped with Betadine soap  and solution, draped in a sterile fashion.  A horizontal incision was made  in the left side of the neck.  The line of the incision was treated with  local anesthetic with epinephrine.  Dissection was carried down through the  subcutaneous tissue.  Bipolar cautery and electrocautery were used to  maintain hemostasis.  Dissection was carried down to the anterior aspect of  the vertebral column and we identified an anterior osteophyte after which  was taken _________ at the C5-6 level.  Diskectomy was begun with an  incision at the annular diskectomy with micro curets and pituitary rongeurs.  The anterior aspect overgrowth was removed using an osteophyte removal tool.  The end-plates of the  corresponding vertebra were removed using the micro  curets along with the black Max drill.  The microscope was draped and  brought onto the field to provide instrument magnification illumination and  visualization and the remainder of the decompression was performed using  microdissection and microsurgical technique.  There was significant  posterior osteophytic overgrowth which was removed using the  micro Max  drill along with the 2 mm Kerrison punches and foot plate.  The posterior  longitudinal ligament was thickened and was carefully removed, decompressing  the spinal canal and thecal sac.  We then continued the decompression  laterally, decompressing the neural foramina where there was spondylitic  overgrowth encroaching upon the foramina bilaterally.  This was removed and  we were able to decompress the neural foramina and nerve roots.  Hemostasis  was established with the use of Gelfoam soaked in Thrombin.  All the Gelfoam  then was removed prior to proceeding with the arthrodesis.  We measured the  height of the interspace using  bone spacers and selected a 7 mm graft.  It  was further shaped and sized using the micro Max drill and then the graft  was positioned in the intervertebral disk space and countersunk.  We then  selected a 22 mm Trinica cervical plate.  It was positioned over the fusion.  Construct retraction was discontinued and we secured the plate with a pair  of 4.2 x 12 mm self-drilling screws in each vertebra.  The four screws were  placed in an alternating fashion.  All four screws were fully tightened and  the locking system secured.  We then irrigated the wound with bacitracin  solution.  A check for hemostasis was established and confirmed and we  proceeded with a closed reduction was taken which showed the screws at C5  well.  We barely visualized the graft at C5-6 disk space and we could not  specifically identify the screws at C6 due to the patient's  shoulders  obscuring the view.  However, under direct vision the fusion construct  appeared to have good alignment.  The wound was closed with the platysma  being closed with interrupted inverted 2-0 and 0 Vicryl sutures in the  subcutaneous tissue, subcuticular being closed with interrupted inverted 3-0  Vicryl sutures and the skin edges closed with Dermabond.  The patient  tolerated the procedure well.  The estimated blood loss was less than 10 cc.  Sponge and needle counts were correct.  Following the surgery, the patient  was placed in a soft cervical collar, reversed under anesthetic, extubated  and transferred to the recovery room for further care.                                               Hewitt Shorts, M.D.    RWN/MEDQ  D:  10/09/2003  T:  10/09/2003  Job:  [id number]

## 2010-12-27 NOTE — H&P (Signed)
NAME:  [name], [name] NO.:  [id number]   MEDICAL RECORD NO.:  [id number]                   PATIENT TYPE:  INP   LOCATION:  1843                                 FACILITY:  [facility]   PHYSICIAN:  W. Ashley Royalty., M.D.         DATE OF BIRTH:  10-12-1945   DATE OF ADMISSION:  01/21/2004  DATE OF DISCHARGE:                                HISTORY & PHYSICAL   REASON FOR ADMISSION:  Chest pain.   HISTORY:  This 65 year old black female was admitted to rule out unstable  angina pectoris.  She has a longstanding history of coronary artery disease,  coronary spasm, hypertension and hyperlipidemia.  Her cardiac history dates  back several years.  She has a previous angioplasty of the diagonal and LAD  in 1993, again in 1998 and had a stent placed to the right coronary artery  around 2000 and later had Rotablator of a diagonal.  Since that time, she  has had recurrent hospitalizations and admissions with documented coronary  spasm since that time in multiple vessels.  She unfortunately has continued  to smoke over the years.  Her last cardiac admission was last year in August  at which time she was admitted with what was felt to be coronary spasm with  a slightly elevated troponin.  During that admission, she also had  esophageal spasm and reflux that was documented.  She had had a cervical  laminectomy earlier this year and has continued to smoke and complains of  intermittent chest discomfort.  This morning, she had the onset of  midsternal chest discomfort.  This morning, she had the onset of midsternal  chest discomfort that would be severe, described as pressure heaviness,  associated with sweating and significant nausea, that have occurred both at  rest and with activity.  She has taken a total of six nitroglycerin  throughout the day and presented to the emergency room.  Three sets of  cardiac markers have been negative and an EKG was unremarkable.  She  has  continued to complain of chest discomfort and is admitted at this time to  rule out a myocardial infarction or unstable angina.   Her most recent cardiac catheterization was in November of 2003, at which  time she had coronary spasm documented in the right coronary artery and only  mild to moderate disease elsewhere.   PAST MEDICAL HISTORY:  Her past history is remarkable for coronary artery  disease, as detailed above.  She also has a prior history of esophageal  spasm, hyperlipidemia and significant reflux.  She has also had some mild  hypokalemia in the past.   PAST SURGICAL HISTORY:  Bilateral shoulder repair for rotator cuff surgery.  In addition, she has had recent cervical laminectomy.   ALLERGIES:  None.   CURRENT MEDICATIONS:  1. Lipitor 10 daily.  2. Nitroglycerin as needed.  3. Norvasc daily.  4. Prevacid daily.  5. Isosorbide daily.  FAMILY HISTORY:  Mother is alive and has heart disease and leukemia.  Father  died with leukemia.  Two sisters are living, one with hypertension and one  with a stroke.   SOCIAL HISTORY:  She is married with three children and is a grandmother.  She has smoked for several years, on and off.  She is currently on  disability because of her shoulders and her heart from [facility]  She has  remarried some several years ago and has smoked around 40-50-pack-year  history.   REVIEW OF SYSTEMS:  GENERAL:  Her weight has been stable.  HEENT:  She has no eye, nose, or throat complaints.  SKIN:  No complaints.  CARDIOPULMONARY:  She has recent upper respiratory infections.  GASTROINTESTINAL:  She does have a history of esophageal reflux in the past  and has had a prior history of esophageal manometry that has shown tertiary  contractions and a barium swallow in the past that is consistent with  diffuse esophageal spasm.  She has had negative ultrasounds in the past.  She has had an unsuccessful attempt at endoscopy in the past several  years  and has not been felt to need further evaluation unless she has recurrent  symptoms.  GENITOURINARY:  She has no genitourinary complaints.  MUSCULOSKELETAL:  She has had significant bilateral shoulder arthritis and  some mild low back pain.  NEUROPSYCHIATRY:  She has a history of some anxiety and some depression.  Other than as noted above, the remainder of the review of systems is  unremarkable.   PHYSICAL EXAMINATION:  GENERAL:  On examination, she is a thin, small-framed  black female in no acute distress.  VITAL SIGNS:  Blood pressure is currently 100/60, pulse is currently 70 and  regular.  SKIN:  Warm and dry.  HEENT:  PERRLA.  Nares clear.  Fundi unremarkable.  Pharynx negative.  NECK:  The neck is supple without masses, JVD, thyromegaly or bruits.  LUNGS:  Clear to A&P.  CARDIAC EXAM:  Normal S1 and S2, no S3.  ABDOMEN:  Soft and nontender.  No masses, hepatosplenomegaly or aneurysm.  PULSES:  Femoral and distal pulses are 2+.  There is no edema noted.   A 12-lead EKG was normal.  The ER doctor only ordered serial enzymes, no  other labs available for review.   IMPRESSION:  1. Recurrent chest pain with negative enzymes and negative EKG in a patient     with known coronary artery disease and previous esophageal spasm.  She     also has previous coronary spasm and has had multiple GI as well as     cardiac pathology in the past.  2. Coronary artery disease.     A. Previous angioplasty of the left anterior descending artery and        diagonal.     B. Previous stent of the right coronary artery.     C. Previous Rotablator of the diagonal.     D. Previous, known esophageal spasm.  3. Hyperlipidemia, under treatment.  4. Ongoing cigarette abuse.  5. History of esophageal reflux as well as esophageal spasm, documented in     the past as well as manometry as well as tertiary contractions seen on     barium swallow.  RECOMMENDATIONS:  Very complex patient.  She is  currently having some pain  and is having some mild hypotension.  She will be given IV fluids, IV  nitroglycerin, continued on Norvasc and anticoagulated.  Further workup will  be done per Dr. Katrinka Blazing.                                                Darden Palmer., M.D.    WST/MEDQ  D:  01/21/2004  T:  01/22/2004  Job:  [id number]   cc:   Lesleigh Noe, M.D.  [address]  [address]  [place]  [place] [address]  Fax: [phone]   Georgann Housekeeper, M.D.  [address]  [place]  [place] [address]  Fax: [phone]

## 2010-12-27 NOTE — Cardiovascular Report (Signed)
[facility]  Patient:    [name], [name]                        MRN: [id number] Proc. Date: 01/21/00 Adm. Date:  [id number] Disc. Date: [id number] Attending:  Lum Babe CC:         Lum Babe, M.D.             Celso Sickle, M.D.             Cardiac Catheterization Laborataory                        Cardiac Catheterization  CINE NUMBER:  [id number]  PROCEDURES PERFORMED: 1. Left heart catheterization. 2. Coronary angiography. 3. Left ventriculogram.  INDICATIONS:  [name] is a 65 year old woman with known ASCVD, who has also been diagnosed as having coronary vasospasm.  She was admitted yesterday with unstable angina and has ruled out for myocardial infarction.  There were no significant electrocardiographic changes with this episode of chest pain as there have been in the past.  She is brought now to the catheterization laboratory to identify possible progressive fixed coronary disease versus recurrent coronary vasospasm.  DESCRIPTION OF PROCEDURE:  The patient was brought to the cardiac catheterization laboratory in a post absorptive state.  A 6 French catheter sheath was inserted percutaneously into the right femoral artery.  A 110 cm pigtail catheter was used for pressures in the aorta and left ventricle, as well as a left ventriculogram performed in a 30-degree RAO angulation.  Then 6 [place] #4 left and right Judkins catheters were used for coronary angiography.  Then 0.15 mg of intracoronary nitroglycerin were administered into the left coronary artery and 0.1 mg x 2 into the right coronary artery. At the completion of the procedure, the catheter and catheter sheaths were removed.  Hemostasis was achieved by direct pressure.  HEMODYNAMICS:  The systemic arterial pressure was 123/54 with a mean of 81 mmHg.  There was no systolic gradient across the aortic valve.  The left ventricular end-diastolic pressure was 13 mmHg  pre ventriculogram and unchanged post ventriculogram.  ANGIOGRAPHY:  The left ventriculogram demonstrated normal global size and systolic function without regional wall motion abnormality.  The calculated ejection fraction utilizing a single-plane cine method was 61%.  There was trace mitral regurgitation.  There was no coronary calcification seen.  There was a right dominant coronary artery system present.  The main left coronary artery was without significant obstruction.  The initial angiogram in the LAO cranial angulation revealed the LCA to be diffusely spasmodic with a near 90% stenosis in the ongoing left circumflex after the origin of the first marginal branch.  Following the administration of 0.15 mg of nitroglycerin, the entire artery enlarged significantly.  The previously noted focal stenosis was no longer present.  The LAD itself had only luminal irregularities.  No significant obstructions were seen at all within the course of this vessel.  The left circumflex artery gave rise to a large first marginal branch.  The downgoing vessel in the AV groove was relatively small.  As mentioned, the previously noted focal stenosis in circumflex had completely resolved after the administration of nitroglycerin.  The right coronary artery was also diffusely stenotic, but not as severe as the left coronary.  There was pressure damping with engagement of the right Judkins catheter.  Following the intracoronary administration of nitroglycerin, there  was no longer pressure damping and the artery was widely patent.  The site of previous angioplasty and stent implantation in the mid portion showed a 30% stenosis.  The vessel gave rise to a moderate sized posterior descending artery and a small posterolateral branch.  No obstruction seen.  FINAL IMPRESSION: 1. Diffuse coronary vasospasm with focal stenosis most severe in the large    first marginal branch. 2. Mild thick stenosis in the  mid right coronary artery at the site of    previous angioplasty. 3. Intact left ventricular size and systolic function.  PLAN/RECOMMENDATIONS:  The patient is already taking Cardizem 240 mg p.o. q.d. and Imdur 120 mg p.o. q.d.  Will increase Imdur to 120 mg p.o. q.a.m. and 60 mg p.o. q.p.m., as well as change Cardizem CD to Norvasc 5 mg p.o. q.d. initially and may require increased dosage in the future. DD:  01/21/00 TD:  01/24/00 Job: [id number]

## 2010-12-27 NOTE — H&P (Signed)
NAMEWILLETTE, [name] NO.:  [id number]   MEDICAL RECORD NO.:  [id number]          PATIENT TYPE:  EMS   LOCATION:  MAJO                         FACILITY:  [facility]   PHYSICIAN:  Cassell Clement, M.D. DATE OF BIRTH:  December 06, 1945   DATE OF ADMISSION:  02/18/2006  DATE OF DISCHARGE:                                HISTORY & PHYSICAL   CHIEF COMPLAINT:  Chest pain.   HISTORY:  This is a 65 year old married African-American woman admitted with  chest pain.  She does have a history of known coronary artery disease.  She  has had angioplasty and stents on two occasions.  She thinks it was five or  ten years ago, but is not sure.  She states that Dr. Verdis Prime did the  more recent procedure but does not recall which cardiologist did the initial  procedure.  She had been doing well until several weeks ago when she began  having frequent chest pain again.  She has also had some left arm radiation.  She called Dr. Katrinka Blazing office last week and has an appointment for later this  month for evaluation.  However, tonight her pain worsened and she came to  the emergency room by ambulance.  She did have slight nausea associated with  the pain and states that she blacked out at home and that the patient was  still unresponsive when the EMTs arrived. Their notes indicate that she was  easily arousable when they arrived.  The patient did not take any  nitroglycerin at home.   MEDICATIONS:  Her usual home medications  include Lipitor, Imdur, Celexa,  Prevacid, aspirin, and Norvasc.   ALLERGIES:  She has allergies to no known medicines.   SOCIAL HISTORY:  She is married.  She has three children.  She smokes half a  pack cigarettes a day.  She drinks occasional beer and the nurses notes  indicate that she may be a heavy drinker.  She does not work.  She is on  disability from heart disease and right shoulder problems.   PAST SURGICAL HISTORY:  Includes unsuccessful attempt at the rotary  cuff  repair of the right shoulder.  She has also had a partial hysterectomy.  She  has had left ear surgery, she has had cervical disk surgery by Dr. Newell Coral.  She has had several orthopedic procedures on her arms.   REVIEW OF SYSTEMS:  GASTROINTESTINAL:  None.  GENITOURINARY: No symptoms.  RESPIRATORY: Denies cough or sputum production.  NEUROLOGIC:  She has had  frequent headaches.   PHYSICAL EXAMINATION:  VITAL SIGNS:  Blood pressure is 110/61, pulse 54 and  regular, respirations are normal.  GENERAL:  A well-developed, well-nourished female in no acute distress.  CHEST:  Clear.  HEENT:  Negative.  HEART:  A quiet precordium without murmur, gallop or rub.  She does have  some chest wall tenderness along the sternum.  ABDOMEN:  Soft and nontender.  Liver and spleen are not enlarged.  EXTREMITIES:  No edema or phlebitis.   EKG shows sinus bradycardia with nonspecific T-wave flattening and she does  have a  low potassium.  Chest x-ray has not yet been done.   LABORATORY STUDIES:  White count 6100, hemoglobin 12.2, hematocrit 36.  Sodium 145, potassium 3.3, BUN 5, creatinine 0.8.  CK-MBs of 1.6 and 1.4.   IMPRESSION:  1.  Unstable angina pectoris.  2.  Status post percutaneous transluminal coronary angioplasty and stents      several years ago by Dr. Verdis Prime.  3.  Hypokalemia.  4.  Ongoing cigarette abuse.   DISPOSITION:  Will admit to Dr. Verdis Prime. IV nitroglycerin, IV heparin,  aspirin, beta blockers, serial enzymes, smoking cessation counseling, and  will replete her potassium.  Further workup as per Dr. Verdis Prime.           ______________________________  Cassell Clement, M.D.     TB/MEDQ  D:  02/18/2006  T:  02/19/2006  Job:  [id number]   cc:   Lyn Records, M.D.  Fax: [phone]   Georgann Housekeeper, MD  Fax: [phone]

## 2010-12-27 NOTE — Cardiovascular Report (Signed)
NAME:  [name], [name]                       ACCOUNT NO.:  [id number]   MEDICAL RECORD NO.:  [id number]                   PATIENT TYPE:  INP   LOCATION:  2922                                 FACILITY:  [facility]   PHYSICIAN:  Vesta Mixer, M.D.              DATE OF BIRTH:  24-Mar-1946   DATE OF PROCEDURE:  07/02/2002  DATE OF DISCHARGE:                              CARDIAC CATHETERIZATION   INDICATIONS:  .  The patient is a 65 year old female with a history of coronary artery  disease.  She has known coronary spasm in the past.  She continues to smoke.  She presented to the emergency room today with chest pains that were not  relived with sublingual nitroglycerin.  She had no ECG changes.  She is  brought to the catheterization lab because of persistent chest pain.   PROCEDURE:  Left heart catheterization with coronary angiography.   DESCRIPTION OF PROCEDURE:  The right femoral artery was easily cannulated  using the modified Seldinger technique.   HEMODYNAMICS:  The left ventricular pressure was 105/62 with an aortic  pressure of  105/52.   ANGIOGRAPHY:  The left main was engaged using a Judkins 3.5 catheter.  The  left main coronary artery is fairly smooth and normal.   The left anterior descending artery is a moderate sized vessel.  There are  minor luminal irregularities throughout the course of the LAD.  There is a  small to moderate sized diagonal vessel that has a 30-40% stenosis in the  mid segment.  In some views this stenosis appears to be as much as 50%. It  does appear to be a good site branch possibly because of the small size of  the vessel. It also does not appear to obstruct flow.   The left circumflex artery is a moderate sized vessel.  There are minor  luminal irregularities.   The right coronary artery is small to moderate in size.  Upon engagement,  there was coronary spasm at the tip of the catheter.  This spasm resolved  after intracoronary  nitroglycerin and by pulling the catheter back.  The  proximal aspect of the vessel was fairly normal.  There was a mid stenosis  of approximately 20-30% which corresponding to the previously placed stent.  There was no obstruction to blood flow.   LEFT VENTRICULOGRAM:  The left ventriculogram was performed in the 30 RAO  position. It reveals moderate hypokinesis of a small area of the inferior  wall which probably is due to the coronary spasm of the right coronary  artery, which happened in the catheterization lab.  I would expect that this  would resolve quickly.   COMPLICATIONS:  None.    CONCLUSIONS:  1. Mild to moderate coronary artery disease with evidence of documented     spasm.  2. Normal left ventricular systolic function.   We will continue with the patient's current medication.  Vesta Mixer, M.D.    PJN/MEDQ  D:  07/02/2002  T:  07/02/2002  Job:  [id number]   cc:   Armanda Magic, M.D.  [address], [address]  [place], [place] [address]  Fax: [phone]   Lyn Records III, M.D.  [address]  [address]  [place]  [place] [address]  Fax: [phone]

## 2010-12-27 NOTE — Consult Note (Signed)
NAME:  [name], [name] NO.:  [id number]   MEDICAL RECORD NO.:  [id number]          PATIENT TYPE:  INP   LOCATION:  3710                         FACILITY:  [facility]   PHYSICIAN:  Graylin Shiver, M.D.   DATE OF BIRTH:  1945/10/22   DATE OF CONSULTATION:  11/29/2006  DATE OF DISCHARGE:                                 CONSULTATION   S   REASON FOR CONSULTATION:  The patient is a 65 year old female who  developed a sore throat last week, then two days ago developed chest  pain and difficulty swallowing.  She feels like food is sticking in the  region of the upper esophagus area.  It also hurts when she swallows.  The patient has a history of GERD and esophageal spasm which was  diagnosed a few years ago by manometry per my review of the e-chart.  The patient states that she had an attempt at endoscopy several years  ago but it was unsuccessful.  The scope could not be put down.  She was  told back then that endoscopy might be dangerous on her and she is  somewhat reluctant to have it done at this time.  She states that her  primary gastroenterologist is Dr. Danise Edge.  She did have a  colonoscopy by him in the past.   After speaking with Dr. Earl Gala today in regards to the patient's chest  pain, it was felt that her chest pain was not likely of cardiac origin,  but sounds more GI in nature, especially given the fact that she is  experiencing dysphagia and odynophagia.   PAST HISTORY:   ALLERGIES:  None known.   MEDICATIONS PRIOR TO ADMISSION:  Lipitor, Imdur, Lexapro, Klonopin,  Prevacid, Cartia XT.   MEDICAL PROBLEMS:  1. GERD.  2. Hypertension.  3. Depression.  4. Coronary artery disease.  5. History of esophageal spasm.   SOCIAL HISTORY:  She smokes, does not drink alcohol.   PHYSICAL EXAMINATION:  She is in no acute distress.  Nonicteric.  NECK:  Supple.  HEART:  Regular rhythm.  No murmurs.  LUNGS:  Lungs were clear.  ABDOMEN:  Soft,  nontender.   IMPRESSION:  1. Chest pain.  Suspect this is of noncardiac origin.  2. Dysphagia and odynophagia.  3. History of gastroesophageal reflux disease.  4. History of esophageal spasm.   PLAN:  Her Protonix has been increased to b.i.d. now.  We will see if  this helps.  I discussed endoscopy with her but she is reluctant to have  this done because of the history as stated above.  She would prefer  proceeding with x-rays first.  I will therefore order a barium swallow  with a barium tablet and see if this shows anything and see how she  responds to the increase in her proton pump inhibitor.  r           ______________________________  Graylin Shiver, M.D.     SFG/MEDQ  D:  11/29/2006  T:  11/29/2006  Job:  [id number]   cc:   Georgann Housekeeper, MD  Danise Edge,  M.D. 

## 2010-12-27 NOTE — H&P (Signed)
NAME:  [name], [name]                       ACCOUNT NO.:  [id number]   MEDICAL RECORD NO.:  [id number]                   PATIENT TYPE:  INP   LOCATION:  3313                                 FACILITY:  [facility]   PHYSICIAN:  Hal T. Stoneking, M.D.              DATE OF BIRTH:  [birth year]   DATE OF ADMISSION:  11/02/2002  DATE OF DISCHARGE:  11/03/2002                                HISTORY & PHYSICAL   IDENTIFYING DATA:  The patient is a 65 year old black female, a patient of  Dr. Jerelyn Scott Husain's.  She has had a history of coronary artery disease in  the past.  She has had multiple procedures; in 1998 had a PTCA of the LAD by  Dr. Lacretia Nicks. Viann Fish and then in April of 1998, had a PTCA in the LAD and  diagonal; in April of 2000, had a stent to the mid right coronary artery;  April of 2000, had a PTCA of a diagonal of the LAD; in October of 2000, had  right coronary artery vasospasm, responded to intracoronary nitroglycerin;  June of 2001, had diffuse coronary artery spasm in a large obtuse marginal,  again responded to intracoronary nitroglycerin; in June of 2003, diagnostic  catheterization revealed 50% stenosis at the origin of diagonal #1, again,  spasm noted in the right coronary artery and responded to intracoronary  nitroglycerin.  She has also had a past history of esophageal spasm noted by  esophageal manometry with a history of GERD.  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  A  roll of cloth was coming out that apparently was defective.  The cloth was  then rolling very quickly and she was having to move back and forth within  her work station to marked the flawed areas.  She started to develop  substernal chest pain, felt hot and diaphoretic; no nausea or dyspnea.  She  took three nitroglycerins without relief.  EMS arrived and gave her aspirin  and two nitroglycerin.  The pain started to resolve a little bit, but it  has  never totally gone away.   PAST MEDICAL HISTORY:  Past medical history is remarkable for hypertension,  coronary artery disease, as mentioned above,  history of esophageal spasm,  GERD, past history of cigarette abuse.   ALLERGIES:  No known drug allergies.   PREVIOUS SURGERIES:  She has had hysterectomy, BS&O, arthroscopic left knee  surgery.  She has had a breast biopsy and repair of a traumatic injury to  her left ear.   CURRENT MEDICATIONS:  1. Celexa 20 mg a day.  2. Aspirin 325 mg a day.  3. Norvasc 2.5 mg a day.  4. Imdur 60 mg a day.  5. Klonopin -- I believe 1 mg q.h.s.  6. P.r.n. nitroglycerin.   FAMILY HISTORY:  Family history is remarkable for coronary artery disease  in  her mother; she also possibly had leukemia.  She has siblings who have  hypertension.   SOCIAL HISTORY:  She has been married twice.  She has three children and one  child has diabetes.  She is a Insurance account manager for  [employer],  currently denies smoking but has smoked in the past, occasional beer.   REVIEW OF SYSTEMS:  Does complain of headache from the nitroglycerin.  No  change in her vision.  No change in hearing.  No trouble chewing or  swallowing.  Chest pain:  Please see HPI.  No abdominal pain.  Bowels moving  normally.  No GU complaints.   PHYSICAL EXAMINATION:  VITAL SIGNS:  Temperature 97, pulse 73, blood  pressure 102/54, saturation 100%.  HEENT:  Pupils equal, round and reactive to light.  Disks are sharp.  TMs  normal.  NECK:  No JVD.  LUNGS:  Lungs are clear.  HEART:  Regular rate and rhythm without murmur.  ABDOMEN:  Active bowel sounds.  No hepatosplenomegaly or masses palpated.  EXTREMITIES:  Extremities reveal no cyanosis, clubbing or edema.   LABORATORY AND ACCESSORY CLINICAL DATA:  Chest x-ray:  No active disease.   Laboratory data:  White count 6200, hemoglobin 12.8.  CK 96 with an MB of  1.6, troponin 0.01.   ASSESSMENT:  Chest pain with a significant past  history of coronary artery  disease, status post carotid artery spasm, multiple percutaneous  transluminal coronary angioplasties and a stent in the past, need to rule  out myocardial infarction, ? further cardiac workup, may need a cardiology  consult.                                               Hal T. Pete Glatter, M.D.    HTS/MEDQ  D:  11/03/2002  T:  11/03/2002  Job:  [id number]

## 2010-12-27 NOTE — Discharge Summary (Signed)
NAME:  [name], [name]                       ACCOUNT NO.:  [id number]   MEDICAL RECORD NO.:  [id number]                   PATIENT TYPE:  INP   LOCATION:  2901                                 FACILITY:  [facility]   PHYSICIAN:  Lyn Records, M.D.                DATE OF BIRTH:  [birth year]   DATE OF ADMISSION:  03/16/2003  DATE OF DISCHARGE:  03/18/2003                                 DISCHARGE SUMMARY   CONSULTANTS:  1. Althea Grimmer. Luther Parody, M.D., gastroenterology.  2. Georgann Housekeeper, M.D., primary care Tajh Livsey.  3. Danise Edge, M.D., primary care gastroenterologist   DISCHARGE DIAGNOSES:  1. Chest pain, but related to esophageal and/or coronary artery spasm.     A. ___________ serial EKG's with inferior evidence, inferior/lateral        ischemia, but unchanged throughout course of hospital stay.  Peak        troponin I 0.22.  The patient does have history of prior demonstrated        (at time of cardiac catheterization), severe coronary vasospasm        treated with intracoronary nitroglycerin successfully.  She also has        had a history of prior percutaneous transluminal coronary angioplasty        of the LAD, diagonal, and RCA (stent).  Pain free at the time of        discharge.  2. Esophageal spasm and history of gastroesophageal reflux disease/hiatal     hernia:  Consult by Dr. Roosvelt Harps who noted the patient had prior     esophageal manometry ED and that demonstrated 40% non peristolic     contractions that was felt consistent with diffuse esophageal spasms.     Unsuccessful attempt endoscopy secondary to the patient could not relax     the back of her tongue and swallow the scope.  Followup ENT laryngoscopy     and evaluation of the hypopharynx (Dr. Ezzard Standing) was normal.  Followup     upper GI revealed tertiary contractions of the esophagus and small hiatal     hernia, otherwise normal.  She has had prior designated barium swallow     that did not reveal any proximal  esophagal abnormalities.  Also has had     abdominal ultrasound February 2002, negative for biliary disease or     gallstones.  Dr. Luther Parody felt in view of the unsuccessful EGD attempt     in the past few years, and the extent of radiographic and other workup,     that there was no further diagnostic testing necessary at this time, at     least not emergently.  He felt if the problem persisted, could attempt an     upper endoscopy again to assess degree of reflux or perhaps to do another     barium swallow if there had been any other changes and to further  document spasm.  He recommended proton pump inhibitor b.i.d. for a     prolonged period of time to see if this is helpful.  Dr. Luther Parody noted     esophageal spasm is extremely difficult to treat.  He noticed she was     already on nitrates; recommended consideration of adding further calcium     channel blocker or using either of these right before a meal.  He also     felt further anticholinergic, antispasmodics such as NuLEv could also be     tried.  Dr. Luther Parody felt that if the patient remained stable,     therapeutic trials of the above medications and any further evaluation     could take place as an outpatient.  3. QTC prolongation; improved by the time of discharge.  4. Tobacco abuse; met with smoking cessation counselor and the patient     states committed to cessation.  5. History of dyslipidemia; cholesterol profile this admission cholesterol     of 132, triglyceride 39, HDL 75, LDL of 47.  Liver function tests within     normal range.  On Lipitor.  6. Hypokalemia; supplemented.   PLAN:  The patient discharged to home in stable condition.   DISCHARGE MEDICATIONS:  1. Enteric-coated aspirin 325 mg daily.  2. Lipitor 10 mg daily.  3. Celexa 20 mg daily.  4. Isosorbide 120 mg p.o. daily.  5. Norvasc 7.5 mg daily.  6. Prevacid 30 mg daily.  7. Bextra 10 mg daily.  8. Nitroglycerin p.r.n. chest pain.  9. Ambien 5  mg p.o. q.h.s. p.r.n. insomnia.   DISCHARGE ACTIVITIES:  As tolerated.   DISCHARGE DIET:  As before.   SPECIAL INSTRUCTIONS:  Stop smoking.   FOLLOWUP:  Dr. Verdis Prime April 13, 2003 at 2:15 p.m.   HISTORY OF PRESENT ILLNESS:  [name] is a 65 year old female with a  history of coronary artery disease/coronary artery spasm, hypertension,  hyperlipidemia and ongoing tobacco abuse.  She was awoken out of sleep the  morning of admission approximately 3 a.m. with sharp, throbbing substernal  chest pain which radiated to her back.  She also had four episodes of nausea  without hematemesis.  Negative shortness of breath, but positive nausea.  Took sublingual nitrates x3, without relief.  She was diaphoretic.   She presented to the emergency room and was given a GI cocktail without  relief.  EKG revealed T wave inversion in 1, aVL and lateral ventricular  leads.  She was given IV morphine and Zofran with minimal decrease in chest  discomfort.  Serial cardiac enzymes in the emergency room were negative.   The patient continued with refractory chest pain over the next 24 hours.  A  repeat troponin I was obtained which was elevated at 0.22.  Dr. Katrinka Blazing noted  that her chest pain continued unchanged thus discontinuing the IV  nitroglycerin.  By the second hospital day the patient was pain free.   GI consult was obtained by Dr. Luther Parody; details as above.   At one point QTC prolongation at 605 milliseconds.  Subsequent EKG at the  time of discharge improved to 465 milliseconds.   LABORATORY DATA:  WBC 8.1, hemoglobin 12.4; 11.4 on March 17, 2003.  Platelets 300.  Pro time 12, INR 0.8, PTT 36.  Sodium 141, potassium 3.9, as  low as 3.3, 4.3 at time of discharge.  Chloride 105. CO2 30, glucose 90, BUN  11, creatinine 0.6, calcium 8.8, total protein slightly  decreased at 5.7, albumin slightly decreased at 3.3.  LFT's all within normal range.  Serial  cardiac enzymes in the emergency  room were negative x3 sets.  On March 17, 2003 CK of 80, MB fraction 6.8, troponin I 0.22.  TSH within normal range at  0.518.  serum cholesterol 132, triglyceride 49, HDL 75, LDL of 47.   PREVIOUS HISTORY:  1. Coronary arteriosclerotic heart disease.     A. (1998) PTCA LAD.     B. (1998) PTCA of the LAD/diagonal.     C. (2000) PTCA of the diagonal, stent RCA.     D. (October 2000) RCA vasospasms treated with intracoronary        nitroglycerin.     E. (2000) coronary vasospasm of the OM treated with intracoronary        nitroglycerin.     F. (June 2003) RCA vasospasm treated with intracoronary nitroglycerin.     G. (November 2003) cardiac catheterization revealing coronary vasospasm.   1. Shoulder surgery bilateral approximately one month prior to this     admission.  2. Gastroesophageal reflux disease followed by Dr. Danise Edge.  3. Hypertension.  4. Hyperlipidemia.  5. Ongoing tobacco abuse.      Salomon Fick, N.P.                       Lyn Records, M.D.    MES/MEDQ  D:  04/08/2003  T:  04/09/2003  Job:  [id number]   cc:   Althea Grimmer. Luther Parody, M.D.  [address], [address]  [place]  [place] [address]  Fax: [phone]   Georgann Housekeeper, M.D.  [address]  [place]  [place] [address]  Fax: [phone]   Danise Edge, M.D.  [address]  [place]  [place] [address]  Fax: [phone]

## 2010-12-27 NOTE — Discharge Summary (Signed)
[place]  Patient:    [name]                         MRN: [id number] Adm. Date:  [id number] Disc. Date: [id number] Attending:  Lum Babe                           Discharge Summary  HISTORY OF PRESENT ILLNESS:  [age]-year-old black female admitted to the hospital with chest pain.  The patient has had two angioplasties, and has had demonstrated coronary spasm.  There is no evidence for infarction.  Nuclear stress test was negative for ischemia.  She was discharged from the hospital to have follow-up outpatient esophageal manometry.  LABORATORY DATA:  Hemoglobin 13 grams percent, hematocrit 37.0 percent, white count 6700, platelet count 268,000, 59 polys, 35 lymphs, 3 monocytes, 1 eosinophil, no basophils, 2 large unidentified cells.  Follow-up was unchanged.  PT became prolonged with the use of heparin.  Sodium 136, potassium 3.4, chloride 103, CO2 27.  All other chemistries normal.  Homocystines pending.  Lipoprotein (a) is pending.  CK was negative, troponin I was negative on serial studies.  Lipids:  Cholesterol 188, triglycerides 58, HCl 93, LDL 83.  Electrocardiogram:  Sinus bradycardia, first degree AV block, nonspecific T-wave changes.  However, cardiograms progressively improved from the one obtained in he office.  The office one showed flat T-waves in all limb leads, and inverted in 6. The next tracing showed low, but upright, T-waves.  The last tracing showed T-waves to be even more upright.  An adenosine Cardiolite stress test showed no evidence for ischemia, and an ejection fraction of over 60%.  HOSPITAL COURSE:  The patient was treated with intravenous nitroglycerin and heparin.  Both of these medications were eventually stopped.  Her blood pressure did fall to about 100, and she had periods of sinus bradycardia.  She underwent a Cardiolite study.  She could not stress adequately on the treadmill, so  she underwent an adenosine Cardiolite, and this was negative, with a normal ejection fraction.  Accordingly, the patient was discharged from the hospital on September 04, 1999.  It would appear there is no new additional "fixed" coronary artery disease. Coronary artery spasm is still a possibility, as is esophageal spasm.  DISCHARGE DIAGNOSES: 1. Chest pain, no infarction. 2. Coronary artery disease. 3. Esophageal spasm suspected.  DISCHARGE MEDICATIONS: 1. Imdur 120 mg 1 a day. 2. Cardizem CD 240 mg twice a day. 3. Estrace 1 mg a day. 4. Aspirin enteric-coated 325 mg 1 a day. 5. Prevacid 30 mg 1 twice a day, this is a new dose. 6. Nitrostat 0.4 mg 1 under the tongue as needed.  ACTIVITY:  As tolerated.  DIET:  No restrictions.  WOUND CARE:  Not applicable.  SPECIAL INSTRUCTIONS:  None.  FOLLOW-UP:  Return visit to physician in one week.  Will make arrangements for upper endoscopy and esophageal manometry.  CONDITION ON DISCHARGE:  Improved. DD:  09/04/99 TD:  09/05/99 Job: [id number]

## 2010-12-27 NOTE — Op Note (Signed)
NAMEMCKENA, CHERN NO.:  [id number]   MEDICAL RECORD NO.:  [id number]          PATIENT TYPE:  AMB   LOCATION:  ENDO                         FACILITY:  [facility]   PHYSICIAN:  Danise Edge, M.D.   DATE OF BIRTH:  09/09/1945   DATE OF PROCEDURE:  06/18/2004  DATE OF DISCHARGE:                                 OPERATIVE REPORT   PROCEDURE:  Screening colonoscopy.   PROCEDURE INDICATION:  Ms. Tyrese Ficek is a 65 year old female, born  08/19/1945.  Ms. Ishikawa is scheduled to undergo her first screening  colonoscopy with polypectomy to prevent colon cancer.   ENDOSCOPIST:  Danise Edge, M.D.   PREMEDICATION:  1.  Versed 6 mg.  2.  Demerol 60 mg.   DESCRIPTION OF PROCEDURE:  After obtaining informed consent, Ms. Muchow  was placed in the left lateral decubitus position.  I administered  intravenous Demerol and intravenous Versed to achieve conscious sedation for  the procedure.  The patient's blood pressure, oxygen saturation, and cardiac  rhythm were monitored throughout the procedure and documented in the medical  record.   Anal inspection and digital rectal exam were normal.  The Olympus adjustable  pediatric colonoscope was introduced into the rectum and advanced to the  cecum.  Colonic preparation for the exam today was excellent.   RECTUM:  Normal.  SIGMOID COLON AND DESCENDING COLON:  Normal.  SPLENIC FLEXURE:  Normal.  TRANSVERSE COLON:  Normal.  HEPATIC FLEXURE:  Normal.  ASCENDING COLON:  Normal.  CECUM AND ILEOCECAL VALVE:  Normal.   ASSESSMENT:  Normal screening proctocolonoscopy to the cecum.  No endoscopic  evidence for the presence of colorectal neoplasia.      MJ/MEDQ  D:  06/18/2004  T:  06/18/2004  Job:  [id number]   cc:   Georgann Housekeeper, MD  [address]  [place]  [place] [address]  Fax: [phone]

## 2010-12-27 NOTE — Consult Note (Signed)
[facility]  Patient:    [name]                         MRN: [id number] Proc. Date: 09/02/99 Adm. Date:  [id number] Attending:  Lum Babe CC:         Lum Babe, M.D.                          Consultation Report  CONCLUSIONS: 1. Prolonged chest pain, rule out myocardial infarction. Rule out coronary artery    spasm. Rule out other source of discomfort. 2. History of coronary atherosclerotic heart disease.    a. Status post PTCA of the LAD diagonal, 1983.    b. Status post PTCA of LAD and diagonal, May 1998.    c. Status post stent RCA, November 23, 1998.    d. Status post PTCRA diagonal of the LAD, December 06, 1998.    e. Status post a documented spasm involving the right coronary artery relieved       with IV nitroglycerin, May 29, 1999. 3. Continued cigarette use.  RECOMMENDATIONS: 1. Serial enzymes and EKG to rule out myocardial infarction. 2. IV nitroglycerin to control pain. 3. If no objective evidence of ischemia or evidence of infarction, would recommend    nuclear testing to rule out evidence of ischemia.  HISTORY OF PRESENT ILLNESS:  The patient is 65, has a history of coronary artery disease as outlined above. She has recurrent episodes of chest pain and on several occasions has been admitted without evidence of infarction and no documentation of progression of disease. Most recently in October of 2000, she presented with prolonged chest pain, EKG changes, and by catheterization was documented to have right coronary spasm relieved by intracoronary nitroglycerin. She is admitted at this time for further evaluation after several prolonged episodes of chest discomfort.  ALLERGIES:  None.  CURRENT MEDICATIONS: 1. Imdur 120 mg per day. 2. Cardizem CD 240 mg per day. 3. Estrace 1 mg per day. 4. Nitroglycerin 0.4 mg sublingually p.r.n. 5. Aspirin 325 mg per day. 6. Prevacid 30 mg per day.  FAMILY HISTORY:   Mother 70 has CAD. Father died age 7 of leukemia. A sister had a stroke at age 92.  PHYSICAL EXAMINATION:  GENERAL:  The patient was in no distress.  VITAL SIGNS:  Blood pressure was 110/60, heart rate was 70.  SKIN:  Clear and no diaphoresis.  NECK:  Neck veins revealed no JVD or carotid bruits.  LUNGS:  Clear.  CARDIAC:  Normal.  ABDOMEN:  Soft.  EXTREMITIES:  Reveal no edema.  LABORATORY DATA:  EKG reveals normal sinus rhythm without acute ST-T wave changes to suggest ischemia.  Laboratory data on admission included potassium 3.4, CK-MB 105/0.9 and troponin I less than 0.03. DD:  09/03/99 TD:  09/03/99 Job: [id number]

## 2011-01-24 ENCOUNTER — Emergency Department (HOSPITAL_COMMUNITY)
Admission: EM | Admit: 2011-01-24 | Discharge: 2011-01-24 | Disposition: A | Discharge status: Home / Self Care | Payer: Medicare Other | Attending: Emergency Medicine | Admitting: Emergency Medicine

## 2011-01-24 DIAGNOSIS — I10 Essential (primary) hypertension: Secondary | ICD-10-CM | POA: Insufficient documentation

## 2011-01-24 DIAGNOSIS — R229 Localized swelling, mass and lump, unspecified: Secondary | ICD-10-CM | POA: Insufficient documentation

## 2011-01-24 DIAGNOSIS — E7800 Pure hypercholesterolemia, unspecified: Secondary | ICD-10-CM | POA: Insufficient documentation

## 2011-01-24 DIAGNOSIS — R209 Unspecified disturbances of skin sensation: Secondary | ICD-10-CM | POA: Insufficient documentation

## 2011-01-24 DIAGNOSIS — R51 Headache: Secondary | ICD-10-CM | POA: Insufficient documentation

## 2011-01-24 DIAGNOSIS — S0003XA Contusion of scalp, initial encounter: Secondary | ICD-10-CM | POA: Insufficient documentation

## 2011-01-24 DIAGNOSIS — S0083XA Contusion of other part of head, initial encounter: Secondary | ICD-10-CM | POA: Insufficient documentation

## 2011-01-24 DIAGNOSIS — I2510 Atherosclerotic heart disease of native coronary artery without angina pectoris: Secondary | ICD-10-CM | POA: Insufficient documentation

## 2011-03-19 ENCOUNTER — Other Ambulatory Visit: Payer: Self-pay | Admitting: Internal Medicine

## 2011-03-19 DIAGNOSIS — R109 Unspecified abdominal pain: Secondary | ICD-10-CM

## 2011-03-20 ENCOUNTER — Ambulatory Visit
Admission: RE | Admit: 2011-03-20 | Discharge: 2011-03-20 | Disposition: A | Discharge status: Home / Self Care | Payer: Medicare Other | Source: Ambulatory Visit | Attending: Internal Medicine | Admitting: Internal Medicine

## 2011-03-20 DIAGNOSIS — R109 Unspecified abdominal pain: Secondary | ICD-10-CM

## 2011-03-20 MED ORDER — IOHEXOL 300 MG/ML  SOLN
100.0000 mL | Freq: Once | INTRAMUSCULAR | Status: AC | PRN
  Administered 2011-03-20: 100 mL via INTRAVENOUS

## 2011-05-02 LAB — I-STAT 8, (EC8 V) (CONVERTED LAB)
Acid-Base Excess: 4 — ABNORMAL HIGH
BUN: 7
Bicarbonate: 30.3 — ABNORMAL HIGH
Chloride: 108
Glucose, Bld: 97
HCT: 39
Hemoglobin: 13.3
Operator id: 196461
Potassium: 3.8
Sodium: 141
TCO2: 32
pCO2, Ven: 50.1 — ABNORMAL HIGH
pH, Ven: 7.389 — ABNORMAL HIGH

## 2011-05-02 LAB — CBC
HCT: 31.8 — ABNORMAL LOW
HCT: 33.5 — ABNORMAL LOW
HCT: 35.5 — ABNORMAL LOW
Hemoglobin: 10.4 — ABNORMAL LOW
Hemoglobin: 11.2 — ABNORMAL LOW
Hemoglobin: 11.6 — ABNORMAL LOW
MCHC: 32.7
MCHC: 32.8
MCHC: 33.4
MCV: 88.5
MCV: 89
MCV: 89
Platelets: 254
Platelets: 263
Platelets: 298
RBC: 3.57 — ABNORMAL LOW
RBC: 3.76 — ABNORMAL LOW
RBC: 4.01
RDW: 16.9 — ABNORMAL HIGH
RDW: 17 — ABNORMAL HIGH
RDW: 17 — ABNORMAL HIGH
WBC: 5.9
WBC: 6.7
WBC: 8

## 2011-05-02 LAB — COMPREHENSIVE METABOLIC PANEL
ALT: 12
AST: 18
Albumin: 3.5
Alkaline Phosphatase: 104
BUN: 6
CO2: 32
Calcium: 9.5
Chloride: 105
Creatinine, Ser: 0.71
GFR calc Af Amer: >60
GFR calc non Af Amer: >60
Glucose, Bld: 100 — ABNORMAL HIGH
Potassium: 4
Sodium: 143
Total Bilirubin: 0.6
Total Protein: 6.3

## 2011-05-02 LAB — BASIC METABOLIC PANEL
BUN: 10
CO2: 27
Calcium: 8.9
Chloride: 104
Creatinine, Ser: 0.81
GFR calc Af Amer: >60
GFR calc non Af Amer: >60
Glucose, Bld: 103 — ABNORMAL HIGH
Potassium: 4.2
Sodium: 140

## 2011-05-02 LAB — HEPARIN LEVEL (UNFRACTIONATED): Heparin Unfractionated: 0.79 — ABNORMAL HIGH

## 2011-05-02 LAB — DIFFERENTIAL
Basophils Absolute: 0
Basophils Relative: 1
Eosinophils Absolute: 0.1
Eosinophils Relative: 1
Lymphocytes Relative: 38
Lymphs Abs: 3.1
Monocytes Absolute: 0.5
Monocytes Relative: 6
Neutro Abs: 4.3
Neutrophils Relative %: 54

## 2011-05-02 LAB — CARDIAC PANEL(CRET KIN+CKTOT+MB+TROPI)
CK, MB: 1.2
CK, MB: 1.4
Relative Index: INVALID
Relative Index: INVALID
Total CK: 91
Total CK: 98
Troponin I: 0.01
Troponin I: 0.02

## 2011-05-02 LAB — CK TOTAL AND CKMB (NOT AT ARMC)
CK, MB: 1.5
CK, MB: 1.6
CK, MB: 1.7
Relative Index: 1.4
Relative Index: 1.6
Relative Index: INVALID
Total CK: 100
Total CK: 121
Total CK: 98

## 2011-05-02 LAB — POCT CARDIAC MARKERS
CKMB, poc: 1
Myoglobin, poc: 102
Operator id: 196461
Troponin i, poc: <0.05

## 2011-05-02 LAB — TROPONIN I
Troponin I: 0.01
Troponin I: <0.01
Troponin I: <0.01

## 2011-05-02 LAB — SEDIMENTATION RATE: Sed Rate: 9

## 2011-05-02 LAB — POCT I-STAT CREATININE
Creatinine, Ser: 0.8
Operator id: 196461

## 2011-05-02 LAB — D-DIMER, QUANTITATIVE (NOT AT ARMC): D-Dimer, Quant: 0.28

## 2011-05-07 LAB — POCT CARDIAC MARKERS
CKMB, poc: <1 — ABNORMAL LOW
Myoglobin, poc: 26.3
Operator id: 295021
Troponin i, poc: <0.05

## 2011-05-07 LAB — POCT I-STAT, CHEM 8
BUN: 7
Calcium, Ion: 1.1 — ABNORMAL LOW
Chloride: 102
Creatinine, Ser: 0.9
Glucose, Bld: 98
HCT: 33 — ABNORMAL LOW
Hemoglobin: 11.2 — ABNORMAL LOW
Potassium: 3.9
Sodium: 137
TCO2: 28

## 2011-05-07 LAB — D-DIMER, QUANTITATIVE: D-Dimer, Quant: <0.22

## 2011-05-26 LAB — MAGNESIUM: Magnesium: 2

## 2011-05-26 LAB — COMPREHENSIVE METABOLIC PANEL
ALT: 13
AST: 18
Albumin: 3.5
Alkaline Phosphatase: 111
BUN: 10
CO2: 27
Calcium: 9.2
Chloride: 102
Creatinine, Ser: 0.76
GFR calc Af Amer: >60
GFR calc non Af Amer: >60
Glucose, Bld: 168 — ABNORMAL HIGH
Potassium: 3.7
Sodium: 138
Total Bilirubin: 0.5
Total Protein: 6.4

## 2011-05-26 LAB — LIPID PANEL
Cholesterol: 149
HDL: 82
LDL Cholesterol: 53
Total CHOL/HDL Ratio: 1.8
Triglycerides: 69
VLDL: 14

## 2011-05-26 LAB — DIFFERENTIAL
Basophils Absolute: 0
Basophils Relative: 0
Eosinophils Absolute: 0.1
Eosinophils Relative: 2
Lymphocytes Relative: 37
Lymphs Abs: 3
Monocytes Absolute: 0.5
Monocytes Relative: 6
Neutro Abs: 4.5
Neutrophils Relative %: 55

## 2011-05-26 LAB — PLATELET COUNT: Platelets: 286

## 2011-05-26 LAB — CK TOTAL AND CKMB (NOT AT ARMC)
CK, MB: 2.8
CK, MB: 5.2 — ABNORMAL HIGH
Relative Index: 2.2
Relative Index: 4.5 — ABNORMAL HIGH
Total CK: 115
Total CK: 126

## 2011-05-26 LAB — CBC
HCT: 35.1 — ABNORMAL LOW
HCT: 38.7
Hemoglobin: 11.6 — ABNORMAL LOW
Hemoglobin: 12.9
MCHC: 33.2
MCHC: 33.2
MCV: 90.4
MCV: 90.7
Platelets: 265
Platelets: 309
RBC: 3.87
RBC: 4.28
RDW: 16.6 — ABNORMAL HIGH
RDW: 17.1 — ABNORMAL HIGH
WBC: 5.7
WBC: 8.1

## 2011-05-26 LAB — CARDIAC PANEL(CRET KIN+CKTOT+MB+TROPI)
CK, MB: 12.7 — ABNORMAL HIGH
CK, MB: 14.5 — ABNORMAL HIGH
Relative Index: 8.5 — ABNORMAL HIGH
Relative Index: 9.3 — ABNORMAL HIGH
Total CK: 149
Total CK: 156
Troponin I: 0.82
Troponin I: 0.89

## 2011-05-26 LAB — BASIC METABOLIC PANEL
BUN: 3 — ABNORMAL LOW
CO2: 29
Calcium: 8.9
Chloride: 106
Creatinine, Ser: 0.59
GFR calc Af Amer: >60
GFR calc non Af Amer: >60
Glucose, Bld: 113 — ABNORMAL HIGH
Potassium: 3.8
Sodium: 142

## 2011-05-26 LAB — I-STAT 8, (EC8 V) (CONVERTED LAB)
Acid-Base Excess: 3 — ABNORMAL HIGH
BUN: 10
Bicarbonate: 26.5 — ABNORMAL HIGH
Chloride: 105
Glucose, Bld: 168 — ABNORMAL HIGH
HCT: 44
Hemoglobin: 15
Operator id: 257131
Potassium: 3.9
Sodium: 137
TCO2: 28
pCO2, Ven: 34.7 — ABNORMAL LOW
pH, Ven: 7.491 — ABNORMAL HIGH

## 2011-05-26 LAB — POCT CARDIAC MARKERS
CKMB, poc: <1 — ABNORMAL LOW
Myoglobin, poc: 32.7
Operator id: 257131
Troponin i, poc: <0.05

## 2011-05-26 LAB — APTT: aPTT: 32

## 2011-05-26 LAB — TSH: TSH: 0.991

## 2011-05-26 LAB — PROTIME-INR
INR: 0.9
Prothrombin Time: 12.4

## 2011-05-26 LAB — TROPONIN I
Troponin I: 0.05
Troponin I: 0.36 — ABNORMAL HIGH

## 2011-05-26 LAB — HIGH SENSITIVITY CRP: CRP, High Sensitivity: 3.4 — ABNORMAL HIGH

## 2011-05-26 LAB — B-NATRIURETIC PEPTIDE (CONVERTED LAB): Pro B Natriuretic peptide (BNP): 58

## 2011-05-26 LAB — HOMOCYSTEINE: Homocysteine: 6.8

## 2011-07-18 ENCOUNTER — Other Ambulatory Visit: Payer: Self-pay | Admitting: Internal Medicine

## 2011-07-18 DIAGNOSIS — Z1231 Encounter for screening mammogram for malignant neoplasm of breast: Secondary | ICD-10-CM

## 2011-08-22 ENCOUNTER — Ambulatory Visit
Admission: RE | Admit: 2011-08-22 | Discharge: 2011-08-22 | Disposition: A | Discharge status: Home / Self Care | Payer: Medicare Other | Source: Ambulatory Visit | Attending: Internal Medicine | Admitting: Internal Medicine

## 2011-08-22 DIAGNOSIS — Z1231 Encounter for screening mammogram for malignant neoplasm of breast: Secondary | ICD-10-CM

## 2011-12-08 ENCOUNTER — Observation Stay (HOSPITAL_COMMUNITY)
Admission: EM | Admit: 2011-12-08 | Discharge: 2011-12-09 | Disposition: A | Discharge status: Home / Self Care | Payer: Medicare Other | Attending: Internal Medicine | Admitting: Internal Medicine

## 2011-12-08 ENCOUNTER — Encounter (HOSPITAL_COMMUNITY): Payer: Self-pay | Admitting: *Deleted

## 2011-12-08 ENCOUNTER — Emergency Department (HOSPITAL_COMMUNITY): Payer: Medicare Other

## 2011-12-08 DIAGNOSIS — R0789 Other chest pain: Principal | ICD-10-CM | POA: Diagnosis present

## 2011-12-08 DIAGNOSIS — F32A Depression, unspecified: Secondary | ICD-10-CM | POA: Diagnosis present

## 2011-12-08 DIAGNOSIS — E785 Hyperlipidemia, unspecified: Secondary | ICD-10-CM | POA: Diagnosis present

## 2011-12-08 DIAGNOSIS — F329 Major depressive disorder, single episode, unspecified: Secondary | ICD-10-CM | POA: Insufficient documentation

## 2011-12-08 DIAGNOSIS — K219 Gastro-esophageal reflux disease without esophagitis: Secondary | ICD-10-CM | POA: Diagnosis present

## 2011-12-08 DIAGNOSIS — I2510 Atherosclerotic heart disease of native coronary artery without angina pectoris: Secondary | ICD-10-CM | POA: Insufficient documentation

## 2011-12-08 DIAGNOSIS — I959 Hypotension, unspecified: Secondary | ICD-10-CM | POA: Insufficient documentation

## 2011-12-08 DIAGNOSIS — F3289 Other specified depressive episodes: Secondary | ICD-10-CM | POA: Insufficient documentation

## 2011-12-08 DIAGNOSIS — I10 Essential (primary) hypertension: Secondary | ICD-10-CM | POA: Diagnosis present

## 2011-12-08 HISTORY — DX: Anemia, unspecified: D64.9

## 2011-12-08 HISTORY — DX: Gastro-esophageal reflux disease without esophagitis: K21.9

## 2011-12-08 HISTORY — DX: Pure hypercholesterolemia, unspecified: E78.00

## 2011-12-08 HISTORY — DX: Essential (primary) hypertension: I10

## 2011-12-08 HISTORY — DX: Encounter for other specified aftercare: Z51.89

## 2011-12-08 HISTORY — DX: Reserved for inherently not codable concepts without codable children: IMO0001

## 2011-12-08 HISTORY — DX: Anxiety disorder, unspecified: F41.9

## 2011-12-08 HISTORY — DX: Diaphragmatic hernia without obstruction or gangrene: K44.9

## 2011-12-08 HISTORY — DX: Atherosclerotic heart disease of native coronary artery without angina pectoris: I25.10

## 2011-12-08 LAB — CBC
HCT: 37.7 % (ref 36.0–46.0)
HCT: 40.1 % (ref 36.0–46.0)
Hemoglobin: 12.8 g/dL (ref 12.0–15.0)
Hemoglobin: 13.5 g/dL (ref 12.0–15.0)
MCH: 32.2 pg (ref 26.0–34.0)
MCH: 32.1 pg (ref 26.0–34.0)
MCHC: 34 g/dL (ref 30.0–36.0)
MCHC: 33.7 g/dL (ref 30.0–36.0)
MCV: 94.7 fL (ref 78.0–100.0)
MCV: 95.5 fL (ref 78.0–100.0)
Platelets: 275 10*3/uL (ref 150–400)
Platelets: 275 10*3/uL (ref 150–400)
RBC: 3.98 MIL/uL (ref 3.87–5.11)
RBC: 4.2 MIL/uL (ref 3.87–5.11)
RDW: 14.5 % (ref 11.5–15.5)
RDW: 14.4 % (ref 11.5–15.5)
WBC: 6.7 10*3/uL (ref 4.0–10.5)
WBC: 6.6 10*3/uL (ref 4.0–10.5)

## 2011-12-08 LAB — RAPID URINE DRUG SCREEN, HOSP PERFORMED
Amphetamines: NOT DETECTED
Barbiturates: POSITIVE — AB
Benzodiazepines: NOT DETECTED
Cocaine: NOT DETECTED
Opiates: POSITIVE — AB
Tetrahydrocannabinol: NOT DETECTED

## 2011-12-08 LAB — DIFFERENTIAL
Basophils Absolute: 0 10*3/uL (ref 0.0–0.1)
Basophils Relative: 0 % (ref 0–1)
Eosinophils Absolute: 0.1 10*3/uL (ref 0.0–0.7)
Eosinophils Relative: 1 % (ref 0–5)
Lymphocytes Relative: 35 % (ref 12–46)
Lymphs Abs: 2.4 10*3/uL (ref 0.7–4.0)
Monocytes Absolute: 0.5 10*3/uL (ref 0.1–1.0)
Monocytes Relative: 7 % (ref 3–12)
Neutro Abs: 3.8 10*3/uL (ref 1.7–7.7)
Neutrophils Relative %: 56 % (ref 43–77)

## 2011-12-08 LAB — BASIC METABOLIC PANEL
BUN: 16 mg/dL (ref 6–23)
CO2: 28 mEq/L (ref 19–32)
Calcium: 9.1 mg/dL (ref 8.4–10.5)
Chloride: 104 mEq/L (ref 96–112)
Creatinine, Ser: 0.75 mg/dL (ref 0.50–1.10)
GFR calc Af Amer: >90 mL/min (ref >90)
GFR calc non Af Amer: 87 mL/min — ABNORMAL LOW (ref >90)
Glucose, Bld: 117 mg/dL — ABNORMAL HIGH (ref 70–99)
Potassium: 4.3 mEq/L (ref 3.5–5.1)
Sodium: 139 mEq/L (ref 135–145)

## 2011-12-08 LAB — CREATININE, SERUM
Creatinine, Ser: 0.88 mg/dL (ref 0.50–1.10)
GFR calc Af Amer: 78 mL/min — ABNORMAL LOW (ref >90)
GFR calc non Af Amer: 67 mL/min — ABNORMAL LOW (ref >90)

## 2011-12-08 LAB — CARDIAC PANEL(CRET KIN+CKTOT+MB+TROPI)
CK, MB: 3.8 ng/mL (ref 0.3–4.0)
CK, MB: 2.9 ng/mL (ref 0.3–4.0)
Relative Index: INVALID (ref 0.0–2.5)
Relative Index: INVALID (ref 0.0–2.5)
Total CK: 75 U/L (ref 7–177)
Total CK: 60 U/L (ref 7–177)
Troponin I: <0.3 ng/mL (ref <0.30)
Troponin I: <0.3 ng/mL (ref <0.30)

## 2011-12-08 LAB — GLUCOSE, CAPILLARY: Glucose-Capillary: 136 mg/dL — ABNORMAL HIGH (ref 70–99)

## 2011-12-08 MED ORDER — ASPIRIN EC 81 MG PO TBEC
81.0000 mg | DELAYED_RELEASE_TABLET | Freq: Every day | ORAL | Status: DC
  Administered 2011-12-09: 81 mg via ORAL
  Filled 2011-12-08 (×2): qty 1

## 2011-12-08 MED ORDER — ONDANSETRON HCL 4 MG/2ML IJ SOLN: 4.0000 mg | Freq: Four times a day (QID) | INTRAMUSCULAR | Status: DC | PRN

## 2011-12-08 MED ORDER — SODIUM CHLORIDE 0.9 % IV SOLN
INTRAVENOUS | Status: DC
  Administered 2011-12-09: via INTRAVENOUS

## 2011-12-08 MED ORDER — HYDROMORPHONE HCL PF 1 MG/ML IJ SOLN
1.0000 mg | INTRAMUSCULAR | Status: DC | PRN
  Administered 2011-12-08: 1 mg via INTRAVENOUS
  Filled 2011-12-08: qty 1

## 2011-12-08 MED ORDER — ZOLPIDEM TARTRATE 5 MG PO TABS: 10.0000 mg | ORAL_TABLET | Freq: Every evening | ORAL | Status: DC | PRN

## 2011-12-08 MED ORDER — SODIUM CHLORIDE 0.9 % IJ SOLN: 3.0000 mL | INTRAMUSCULAR | Status: DC | PRN

## 2011-12-08 MED ORDER — GI COCKTAIL ~~LOC~~
30.0000 mL | Freq: Once | ORAL | Status: AC
  Administered 2011-12-08: 30 mL via ORAL
  Filled 2011-12-08: qty 30

## 2011-12-08 MED ORDER — MORPHINE SULFATE 4 MG/ML IJ SOLN
4.0000 mg | Freq: Once | INTRAMUSCULAR | Status: AC
  Administered 2011-12-08: 4 mg via INTRAVENOUS
  Filled 2011-12-08: qty 1

## 2011-12-08 MED ORDER — HEPARIN SODIUM (PORCINE) 5000 UNIT/ML IJ SOLN
5000.0000 [IU] | Freq: Three times a day (TID) | INTRAMUSCULAR | Status: DC
  Administered 2011-12-08 – 2011-12-09 (×3): 5000 [IU] via SUBCUTANEOUS
  Filled 2011-12-08 (×5): qty 1

## 2011-12-08 MED ORDER — SODIUM CHLORIDE 0.9 % IV SOLN: 250.0000 mL | INTRAVENOUS | Status: DC | PRN

## 2011-12-08 MED ORDER — ONDANSETRON HCL 4 MG PO TABS: 4.0000 mg | ORAL_TABLET | Freq: Four times a day (QID) | ORAL | Status: DC | PRN

## 2011-12-08 MED ORDER — SODIUM CHLORIDE 0.9 % IJ SOLN
3.0000 mL | Freq: Two times a day (BID) | INTRAMUSCULAR | Status: DC
  Administered 2011-12-08 – 2011-12-09 (×2): 3 mL via INTRAVENOUS

## 2011-12-08 MED ORDER — PANTOPRAZOLE SODIUM 40 MG PO TBEC
40.0000 mg | DELAYED_RELEASE_TABLET | Freq: Every day | ORAL | Status: DC
  Administered 2011-12-08 – 2011-12-09 (×2): 40 mg via ORAL
  Filled 2011-12-08: qty 1
  Filled 2011-12-08: qty 2
  Filled 2011-12-08: qty 1

## 2011-12-08 MED ORDER — SIMVASTATIN 40 MG PO TABS
40.0000 mg | ORAL_TABLET | Freq: Every evening | ORAL | Status: DC
  Administered 2011-12-08: 40 mg via ORAL
  Filled 2011-12-08 (×2): qty 1

## 2011-12-08 MED ORDER — RAMIPRIL 2.5 MG PO CAPS
2.5000 mg | ORAL_CAPSULE | Freq: Every day | ORAL | Status: DC
  Administered 2011-12-08: 2.5 mg via ORAL
  Filled 2011-12-08 (×2): qty 1

## 2011-12-08 MED ORDER — ONDANSETRON HCL 4 MG/2ML IJ SOLN
4.0000 mg | Freq: Once | INTRAMUSCULAR | Status: AC
  Administered 2011-12-08: 4 mg via INTRAVENOUS
  Filled 2011-12-08: qty 2

## 2011-12-08 MED ORDER — HYDROMORPHONE HCL PF 1 MG/ML IJ SOLN: 1.0000 mg | INTRAMUSCULAR | Status: DC | PRN

## 2011-12-08 MED ORDER — HYDROMORPHONE HCL PF 2 MG/ML IJ SOLN: 2.0000 mg | INTRAMUSCULAR | Status: DC | PRN

## 2011-12-08 MED ORDER — HYDROMORPHONE HCL PF 1 MG/ML IJ SOLN
1.0000 mg | Freq: Once | INTRAMUSCULAR | Status: AC
  Administered 2011-12-08: 1 mg via INTRAVENOUS
  Filled 2011-12-08: qty 1

## 2011-12-08 MED ORDER — NITROGLYCERIN 0.4 MG SL SUBL: 0.4000 mg | SUBLINGUAL_TABLET | SUBLINGUAL | Status: DC | PRN

## 2011-12-08 MED ORDER — GI COCKTAIL ~~LOC~~
30.0000 mL | Freq: Three times a day (TID) | ORAL | Status: DC | PRN
  Administered 2011-12-09: 30 mL via ORAL
  Filled 2011-12-08 (×2): qty 30

## 2011-12-08 MED ORDER — ISOSORBIDE MONONITRATE ER 60 MG PO TB24
120.0000 mg | ORAL_TABLET | Freq: Every day | ORAL | Status: DC
Start: 2011-12-08 — End: 2011-12-09
  Administered 2011-12-08 – 2011-12-09 (×2): 120 mg via ORAL
  Filled 2011-12-08 (×2): qty 2

## 2011-12-08 MED ORDER — PANTOPRAZOLE SODIUM 40 MG IV SOLR
40.0000 mg | Freq: Once | INTRAVENOUS | Status: AC
  Administered 2011-12-08: 40 mg via INTRAVENOUS
  Filled 2011-12-08: qty 40

## 2011-12-08 MED ORDER — RANOLAZINE ER 500 MG PO TB12
500.0000 mg | ORAL_TABLET | Freq: Two times a day (BID) | ORAL | Status: DC
  Administered 2011-12-08 – 2011-12-09 (×2): 500 mg via ORAL
  Filled 2011-12-08 (×3): qty 1

## 2011-12-08 MED ORDER — ACETAMINOPHEN 650 MG RE SUPP: 650.0000 mg | Freq: Four times a day (QID) | RECTAL | Status: DC | PRN

## 2011-12-08 MED ORDER — FERROUS SULFATE 325 (65 FE) MG PO TABS
325.0000 mg | ORAL_TABLET | Freq: Every day | ORAL | Status: DC
  Administered 2011-12-09: 325 mg via ORAL
  Filled 2011-12-08 (×2): qty 1

## 2011-12-08 MED ORDER — ASPIRIN 81 MG PO CHEW: 81.0000 mg | CHEWABLE_TABLET | Freq: Every day | ORAL | Status: DC

## 2011-12-08 MED ORDER — ACETAMINOPHEN 325 MG PO TABS: 650.0000 mg | ORAL_TABLET | Freq: Four times a day (QID) | ORAL | Status: DC | PRN

## 2011-12-08 MED ORDER — ONDANSETRON HCL 4 MG/2ML IJ SOLN: 4.0000 mg | Freq: Three times a day (TID) | INTRAMUSCULAR | Status: DC | PRN

## 2011-12-08 MED ORDER — ALUMINUM-MAGNESIUM-SIMETHICONE 200-200-20 MG/5ML PO SUSP
30.0000 mL | Freq: Three times a day (TID) | ORAL | Status: DC | PRN
  Administered 2011-12-08: 30 mL via ORAL
  Filled 2011-12-08 (×3): qty 30

## 2011-12-08 MED ORDER — OXYCODONE HCL 5 MG PO TABS: 5.0000 mg | ORAL_TABLET | ORAL | Status: DC | PRN

## 2011-12-08 MED ORDER — AMLODIPINE BESYLATE 5 MG PO TABS
5.0000 mg | ORAL_TABLET | Freq: Every day | ORAL | Status: DC
  Administered 2011-12-08: 5 mg via ORAL
  Filled 2011-12-08 (×2): qty 1

## 2011-12-08 MED ORDER — CLONAZEPAM 0.5 MG PO TABS: 0.5000 mg | ORAL_TABLET | Freq: Two times a day (BID) | ORAL | Status: DC | PRN

## 2011-12-08 MED ORDER — HYDROMORPHONE HCL PF 2 MG/ML IJ SOLN
2.0000 mg | INTRAMUSCULAR | Status: DC | PRN
  Filled 2011-12-08: qty 1

## 2011-12-08 MED ORDER — SODIUM CHLORIDE 0.9 % IJ SOLN: 3.0000 mL | Freq: Two times a day (BID) | INTRAMUSCULAR | Status: DC

## 2011-12-08 MED ORDER — GABAPENTIN 300 MG PO CAPS
300.0000 mg | ORAL_CAPSULE | Freq: Three times a day (TID) | ORAL | Status: DC
  Administered 2011-12-08 – 2011-12-09 (×2): 300 mg via ORAL
  Filled 2011-12-08 (×4): qty 1

## 2011-12-08 MED ORDER — PANTOPRAZOLE SODIUM 40 MG IV SOLR: 40.0000 mg | Freq: Once | INTRAVENOUS | Status: DC

## 2011-12-08 NOTE — Progress Notes (Signed)
Pt c/o feeling hot.  Pt lying under covers and has hospital gown on with fleece pajama pants.  VS as follows temp 98.2, HR 65 - NSR, BP 94/52, O2 100 on room air and cbg 136.  Pt states she is a little dizzy too.  Pt assisted to bathroom and pt voided 250cc amber urine.  Pt educated to call for assist to get out of bed.  K. Craige Cotta paged and awaiting return call.  Will continue to monitor.

## 2011-12-08 NOTE — ED Notes (Signed)
Pt reports waking up at 0300 with substernal CP radiating to her back.  Pt reports diaphoresis after taking 3 SL nitro and 324 asa.  Pt reports hx of same and was dx with muscle spasms.  Pt reports having nausea, denies SOB and vomiting.  Pt A/O x 4.  No distress noted.  No tenderness on palpation.

## 2011-12-08 NOTE — ED Notes (Signed)
Admitting MD paged to question SD vs tele.

## 2011-12-08 NOTE — ED Notes (Signed)
Admitting MD at bedside.

## 2011-12-08 NOTE — ED Notes (Signed)
Pt reports that she is still having CP that is unrelieved with dilaudid.  Admitting MD to come see the patient in the ED instead of on the floor.  Will be here in an hour.

## 2011-12-08 NOTE — ED Provider Notes (Addendum)
History     CSN: [id number]  Arrival date & time 12/08/11  [id number]   First MD Initiated Contact with Patient 12/08/11 [phone]      Chief Complaint  Patient presents with  . Chest Pain    (Consider location/radiation/quality/duration/timing/severity/associated sxs/prior treatment) HPI Comments: Patient presents with substernal chest pain that began at approximately 3 AM this morning.  It woke her from sleep.  Patient states the pain has remained constant since that time without any specific inciting or relieving factors.  Patient has been given nitroglycerin by EMS without any relief.  She also took 324 mg of aspirin prior to arrival.  She has some associated nausea but no vomiting.  No recent coughs or fevers.  No shortness of breath.  She did note some mild diaphoresis when she first woke up.  She states that this pain feels somewhat like when she's had stenting but also like when she's just had muscle spasms as well.  No abdominal pain.  Patient is a 66 y.o. female presenting with chest pain. The history is provided by the patient. No language interpreter was used.  Chest Pain The chest pain began 3 - 5 hours ago. Chest pain occurs constantly. The chest pain is unchanged. The severity of the pain is moderate. The quality of the pain is described as tightness. The pain does not radiate. Primary symptoms include nausea. Pertinent negatives for primary symptoms include no fever, no fatigue, no syncope, no shortness of breath, no cough, no wheezing, no palpitations, no abdominal pain, no vomiting, no dizziness and no altered mental status.  Associated symptoms include diaphoresis. She tried nitroglycerin and aspirin (No relief) for the symptoms. Risk factors include being elderly and smoking/tobacco exposure.  Her past medical history is significant for CAD.     Past Medical History  Diagnosis Date  . Hypertension   . Hypercholesteremia   . Hiatal hernia     Past Surgical History  Procedure  Date  . Cardiac stent   . Shoulder surgery     History reviewed. No pertinent family history.  History  Substance Use Topics  . Smoking status: Former Smoker -- 1.0 packs/day  . Smokeless tobacco: Not on file  . Alcohol Use: No    OB History    Grav Para Term Preterm Abortions TAB SAB Ect Mult Living                  Review of Systems  Constitutional: Positive for diaphoresis. Negative for fever, chills and fatigue.  HENT: Negative.   Eyes: Negative.  Negative for discharge and redness.  Respiratory: Negative.  Negative for cough, shortness of breath and wheezing.   Cardiovascular: Positive for chest pain. Negative for palpitations and syncope.  Gastrointestinal: Positive for nausea. Negative for vomiting, abdominal pain and diarrhea.  Genitourinary: Negative.  Negative for dysuria and vaginal discharge.  Musculoskeletal: Negative.  Negative for back pain.  Skin: Negative.  Negative for color change and rash.  Neurological: Negative.  Negative for dizziness, syncope and headaches.  Hematological: Negative.  Negative for adenopathy.  Psychiatric/Behavioral: Negative.  Negative for confusion and altered mental status.  All other systems reviewed and are negative.    Allergies  Review of patient's allergies indicates no known allergies.  Home Medications  No current outpatient prescriptions on file.  BP 111/67  Pulse 54  Temp(Src) 97.7 F (36.5 C) (Oral)  Resp 22  SpO2 98%  Physical Exam  Nursing note and vitals reviewed. Constitutional: She is oriented  to person, place, and time. She appears well-developed and well-nourished.  Non-toxic appearance. She does not have a sickly appearance.  HENT:  Head: Normocephalic and atraumatic.  Eyes: Conjunctivae, EOM and lids are normal. Pupils are equal, round, and reactive to light. No scleral icterus.  Neck: Trachea normal and normal range of motion. Neck supple.  Cardiovascular: Normal rate, regular rhythm and normal  heart sounds.  Exam reveals no gallop and no friction rub.   No murmur heard. Pulmonary/Chest: Effort normal and breath sounds normal. No respiratory distress. She has no wheezes. She has no rales. She exhibits no tenderness.  Abdominal: Soft. Normal appearance. There is no tenderness. There is no rebound, no guarding and no CVA tenderness.  Musculoskeletal: Normal range of motion. She exhibits no edema.  Neurological: She is alert and oriented to person, place, and time. She has normal strength.  Skin: Skin is warm, dry and intact. No rash noted.  Psychiatric: She has a normal mood and affect. Her behavior is normal. Judgment and thought content normal.    ED Course  Procedures (including critical care time)  Results for orders placed during the hospital encounter of 12/08/11  CBC      Component Value Range   WBC 6.7  4.0 - 10.5 (K/uL)   RBC 3.98  3.87 - 5.11 (MIL/uL)   Hemoglobin 12.8  12.0 - 15.0 (g/dL)   HCT 40.9  81.1 - 91.4 (%)   MCV 94.7  78.0 - 100.0 (fL)   MCH 32.2  26.0 - 34.0 (pg)   MCHC 34.0  30.0 - 36.0 (g/dL)   RDW 78.2  95.6 - 21.3 (%)   Platelets 275  150 - 400 (K/uL)  DIFFERENTIAL      Component Value Range   Neutrophils Relative 56  43 - 77 (%)   Neutro Abs 3.8  1.7 - 7.7 (K/uL)   Lymphocytes Relative 35  12 - 46 (%)   Lymphs Abs 2.4  0.7 - 4.0 (K/uL)   Monocytes Relative 7  3 - 12 (%)   Monocytes Absolute 0.5  0.1 - 1.0 (K/uL)   Eosinophils Relative 1  0 - 5 (%)   Eosinophils Absolute 0.1  0.0 - 0.7 (K/uL)   Basophils Relative 0  0 - 1 (%)   Basophils Absolute 0.0  0.0 - 0.1 (K/uL)  BASIC METABOLIC PANEL      Component Value Range   Sodium 139  135 - 145 (mEq/L)   Potassium 4.3  3.5 - 5.1 (mEq/L)   Chloride 104  96 - 112 (mEq/L)   CO2 28  19 - 32 (mEq/L)   Glucose, Bld 117 (*) 70 - 99 (mg/dL)   BUN 16  6 - 23 (mg/dL)   Creatinine, Ser 0.86  0.50 - 1.10 (mg/dL)   Calcium 9.1  8.4 - 57.8 (mg/dL)   GFR calc non Af Amer 87 (*) >90 (mL/min)   GFR calc Af  Amer >90  >90 (mL/min)  CARDIAC PANEL(CRET KIN+CKTOT+MB+TROPI)      Component Value Range   Total CK 75  7 - 177 (U/L)   CK, MB 3.8  0.3 - 4.0 (ng/mL)   Troponin I <0.30  <0.30 (ng/mL)   Relative Index RELATIVE INDEX IS INVALID  0.0 - 2.5    Dg Chest 2 View  12/08/2011  *RADIOLOGY REPORT*  Clinical Data: Mid chest pain, some shortness of breath  CHEST - 2 VIEW  Comparison: Chest x-ray of 08/14/2010  Findings: No active infiltrate or  effusion is seen.  Mediastinal contours appear stable.  The heart is mildly enlarged and stable. No bony abnormality is seen.  A lower anterior cervical spine fusion plate is again noted.  IMPRESSION: Stable chest x-ray.  No active lung disease.  Original Report Authenticated By: Juline Patch, M.D.       Date: 12/08/2011  Rate: 55  Rhythm: sinus bradycardia  QRS Axis: normal  Intervals: normal  ST/T Wave abnormalities: nonspecific T wave changes  Conduction Disutrbances:none  Narrative Interpretation:   Old EKG Reviewed: unchanged from 08/16/2010   MDM  Patient with chest pain of unclear etiology at this time.  It is not exertional but does have some characteristics concerning for possible ACS given this patient has a history of CAD.  Her pain did not improve with nitroglycerin.  I did consider possible GI causes and gave the patient a GI cocktail and protonix.  The patient notes no improvement with these medications either.  We'll give her a dose of morphine to see if this assists with her pain as well.  There are no signs of infection at this time.  The pain is not pleuritic nor is the patient's short of breath to suggest PE.  Patient's pain does not radiate to the back nor is it a tearing sensation and she is not hypertensive to suggest aortic dissection.  Patient's last stress test was in January of 2012 which showed old defects but no new reversible ischemia.  I do believe given the patient's known history she will require further cardiac markers for rule  out of MI at this time.  Nat Christen, MD 12/08/11 [phone]  Patient is still not chest pain-free after the morphine so different dose of Dilaudid.  I will contact you cardiology as I believe this patient requires admission for further rule out and evaluation.  Nat Christen, MD 12/08/11 [phone]  I discussed the patient with Dr. Mayford Knife from [facility] cardiology and she was able to find old catheterization records on this patient which demonstrated small lesions but no actual stents.  Patient had an inferior ST elevation MI related to severe vasospasm.  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  Nevertheless patient does have cardiac risk factors and some mild disease and I believe still wants admission and I will contact the hospitalist service for this.  Nat Christen, MD 12/08/11 [phone]   Discussed with Dr. Radonna Ricker for admission to [facility], tele bed.   Nat Christen, MD 12/08/11 [phone]

## 2011-12-08 NOTE — H&P (Signed)
Hospital Admission Note Date: 12/08/2011  PCP: No primary provider on file.  Chief Complaint: Atypical chest pain  History of Present Illness: This is a 66 year old female with past medical history of hypertension, GERD and hiatal hernia. Also past medical history of coronary artery disease with past medical history of coronary artery disease to the right coronary artery that comes in for atypical chest pain. She relates laying flat makes it worse sitting up makes it better. She relates no relationship with fluids. Exertion has no effect on it. She said "in the lower leg. She starts running. She denies any shortness of breath but does relate palpitations and feeling anxious. She was done here in the ED some nitroglycerin with no improvement in her pain. She also relates in the ED she got a lot of no improvement. She has not done any new exercise improvement. There is no history of trauma. So we were asked to admit and further evaluate.  Allergies: Review of patient's allergies indicates no known allergies. Past Medical History  Diagnosis Date  . Hypertension   . Hypercholesteremia   . GERD (gastroesophageal reflux disease)   . Anxiety   . Hiatal hernia   . Anemia   . Blood transfusion   . Coronary artery disease    Prior to Admission medications   Medication Sig Start Date End Date Taking? Authorizing Provider  aluminum-magnesium hydroxide-simethicone (MAALOX) 200-200-20 MG/5ML SUSP Take 30 mLs by mouth 3 (three) times daily as needed. After meals for indigestion   Yes Historical Provider, MD  amLODipine (NORVASC) 5 MG tablet Take 5 mg by mouth daily.   Yes Historical Provider, MD  arginine 500 MG tablet Take 500 mg by mouth daily.   Yes Historical Provider, MD  aspirin 81 MG chewable tablet Chew 81 mg by mouth daily.   Yes Historical Provider, MD  clonazePAM (KLONOPIN) 0.5 MG tablet Take 0.5 mg by mouth 2 (two) times daily as needed. For anxiety   Yes Historical Provider, MD    cyclobenzaprine (FLEXERIL) 10 MG tablet Take 10 mg by mouth 3 (three) times daily as needed. For muscle pain   Yes Historical Provider, MD  ferrous sulfate 325 (65 FE) MG tablet Take 325 mg by mouth daily with breakfast.   Yes Historical Provider, MD  gabapentin (NEURONTIN) 300 MG capsule Take 300 mg by mouth 3 (three) times daily.   Yes Historical Provider, MD  isosorbide mononitrate (IMDUR) 120 MG 24 hr tablet Take 120 mg by mouth daily.   Yes Historical Provider, MD  nitroGLYCERIN (NITROSTAT) 0.4 MG SL tablet Place 0.4 mg under the tongue every 5 (five) minutes as needed. For chest pain   Yes Historical Provider, MD  pantoprazole (PROTONIX) 40 MG tablet Take 40 mg by mouth daily.   Yes Historical Provider, MD  ramipril (ALTACE) 2.5 MG capsule Take 2.5 mg by mouth daily.   Yes Historical Provider, MD  ranitidine (ZANTAC) 150 MG capsule Take 150 mg by mouth 2 (two) times daily.   Yes Historical Provider, MD  ranolazine (RANEXA) 500 MG 12 hr tablet Take 500 mg by mouth 2 (two) times daily.   Yes Historical Provider, MD  simvastatin (ZOCOR) 40 MG tablet Take 40 mg by mouth every evening.   Yes Historical Provider, MD  traMADol-acetaminophen (ULTRACET) 37.5-325 MG per tablet Take 1 tablet by mouth every 6 (six) hours as needed. For pain   Yes Historical Provider, MD  zolpidem (AMBIEN) 10 MG tablet Take 10 mg by mouth at bedtime as  needed. For sleep   Yes Historical Provider, MD   Past Surgical History  Procedure Date  . Cardiac stent   . Shoulder surgery   . Breast     right, tumor   . Knee surgery left   History reviewed. No pertinent family history. History   Social History  . Marital Status: Married    Spouse Name: N/A    Number of Children: N/A  . Years of Education: N/A   Occupational History  . Not on file.   Social History Main Topics  . Smoking status: Former Smoker -- 1.0 packs/day for 40 years    Types: Cigarettes    Quit date: 10/10/2011  . Smokeless tobacco: Former  Neurosurgeon  . Alcohol Use: 0.6 oz/week    1 Cans of beer per week     occasional  . Drug Use: No  . Sexually Active: No   Other Topics Concern  . Not on file   Social History Narrative  . No narrative on file    REVIEW OF SYSTEMS:  Constitutional:  No weight loss, night sweats, Fevers, chills, fatigue.  HEENT:  No headaches, Difficulty swallowing,Tooth/dental problems,Sore throat,  No sneezing, itching, ear ache, nasal congestion, post nasal drip,  Cardio-vascular:  No chest pain, Orthopnea, PND, swelling in lower extremities, anasarca, dizziness, palpitations  GI:  No heartburn, indigestion, abdominal pain, nausea, vomiting, diarrhea, change in bowel habits, loss of appetite  Resp:   No excess mucus, no productive cough, No non-productive cough, No coughing up of blood.No change in color of mucus.No wheezing.No chest wall deformity  Skin:  no rash or lesions.  GU:  no dysuria, change in color of urine, no urgency or frequency. No flank pain.  Musculoskeletal:  No joint pain or swelling. No decreased range of motion. No back pain.  Psych:  No change in mood or affect. No depression or anxiety. No memory loss.   Physical Exam: Filed Vitals:   12/08/11 1415 12/08/11 1429 12/08/11 1511 12/08/11 1521  BP: 141/61   124/66  Pulse: 64   58  Temp:  97.8 F (36.6 C)  98.9 F (37.2 C)  TempSrc:  Oral  Oral  Resp: 20   16  Height:   4\' 11"  (1.499 m)   Weight:   57.516 kg (126 lb 12.8 oz)   SpO2: 99%   95%   No intake or output data in the 24 hours ending 12/08/11 1552 BP 124/66  Pulse 58  Temp(Src) 98.9 F (37.2 C) (Oral)  Resp 16  Ht 4\' 11"  (1.499 m)  Wt 57.516 kg (126 lb 12.8 oz)  BMI 25.61 kg/m2  SpO2 95%  General Appearance:    Alert, cooperative, no distress, appears stated age  Head:    Normocephalic, without obvious abnormality, atraumatic  Eyes:    PERRL, conjunctiva/corneas clear, EOM's intact, fundi    benign, both eyes  Ears:    Normal TM's and external ear  canals, both ears  Nose:   Nares normal, septum midline, mucosa normal, no drainage    or sinus tenderness  Throat:   Lips, mucosa, and tongue normal; teeth and gums normal  Neck:   Supple, symmetrical, trachea midline, no adenopathy;    thyroid:  no enlargement/tenderness/nodules; no carotid   bruit or JVD  Back:     Symmetric, no curvature, ROM normal, no CVA tenderness  Lungs:     Clear to auscultation bilaterally, respirations unlabored  Chest Wall:    No tenderness or  deformity   Heart:    Regular rate and rhythm, S1 and S2 normal, no murmur, rub   or gallop     Abdomen:     Soft, non-tender, bowel sounds active all four quadrants,    no masses, no organomegaly        Extremities:   Extremities normal, atraumatic, no cyanosis or edema  Pulses:   2+ and symmetric all extremities  Skin:   Skin color, texture, turgor normal, no rashes or lesions  Lymph nodes:   Cervical, supraclavicular, and axillary nodes normal  Neurologic:   CNII-XII intact, normal strength, sensation and reflexes    throughout   Lab results:  Basename 12/08/11 0720  NA 139  K 4.3  CL 104  CO2 28  GLUCOSE 117*  BUN 16  CREATININE 0.75  CALCIUM 9.1  MG --  PHOS --   No results found for this basename: AST:2,ALT:2,ALKPHOS:2,BILITOT:2,PROT:2,ALBUMIN:2 in the last 72 hours No results found for this basename: LIPASE:2,AMYLASE:2 in the last 72 hours  Basename 12/08/11 0720  WBC 6.7  NEUTROABS 3.8  HGB 12.8  HCT 37.7  MCV 94.7  PLT 275    Basename 12/08/11 0720  CKTOTAL 75  CKMB 3.8  CKMBINDEX --  TROPONINI <0.30   No components found with this basename: POCBNP:3 No results found for this basename: DDIMER:2 in the last 72 hours No results found for this basename: HGBA1C:2 in the last 72 hours No results found for this basename: CHOL:2,HDL:2,LDLCALC:2,TRIG:2,CHOLHDL:2,LDLDIRECT:2 in the last 72 hours No results found for this basename: TSH,T4TOTAL,FREET3,T3FREE,THYROIDAB in the last 72 hours No  results found for this basename: VITAMINB12:2,FOLATE:2,FERRITIN:2,TIBC:2,IRON:2,RETICCTPCT:2 in the last 72 hours Imaging results:  Dg Chest 2 View  12/08/2011  *RADIOLOGY REPORT*  Clinical Data: Mid chest pain, some shortness of breath  CHEST - 2 VIEW  Comparison: Chest x-ray of 08/14/2010  Findings: No active infiltrate or effusion is seen.  Mediastinal contours appear stable.  The heart is mildly enlarged and stable. No bony abnormality is seen.  A lower anterior cervical spine fusion plate is again noted.  IMPRESSION: Stable chest x-ray.  No active lung disease.  Original Report Authenticated By: Juline Patch, M.D.   Other results: EKG: normal EKG, normal sinus rhythm.   Patient Active Hospital Problem List: Atypical chest pain (12/08/2011) -Will go ahead and admit this patient to telemetry under observation, we'll cycle her cardiac enzymes x3 and repeat an EKG in the morning. This sounds more like gastrointestinal like symptoms. At home she is on a PPI, go ahead and continue her aspirin. Also continue to monitor hemoglobin. Her belly is not tender to palpation. Does not sound like  pericarditis as her EKG doesn't show diffuse ST segment elevation, also her history is not compatible with pericarditis. A chest x-ray was done and doesn't show any fractures but this pneumonia dissection as she doesn't have a wide mediastinum.  HTN (hypertension) (12/08/2011) -continue home meds.   GERD (gastroesophageal reflux disease) (12/08/2011) -we'll go ahead and continue her Protonix. She does have hiatal hernia. Her chest pain was worst with laying flat this could be the contributing symptom or chest pain. We'll give her a GI cocktail when necessary as needed.  Depression (12/08/2011)  At this time she does not appear depressed. She is in good spirits.  Dyslipidemia (12/08/2011)  check a fasting lipid panel continue statins*    Code Status: full code Family Communication: Spouse  [phone]   Marinda Elk M.D. [facility] [phone] 12/08/2011, 3:52  PM

## 2011-12-08 NOTE — ED Notes (Signed)
Per EMS wake up  at 0300 with CP, radiating to her back, took 3 SL nitro and 324mg  of ASA without relief prior to EMS arrival. Pt was diaphoretic and 12lead EKG was done no elevation, NSR, started PIV 20ga left LAC and  given one more nitro SL without relief

## 2011-12-08 NOTE — ED Notes (Signed)
EKG done by EMT R Manson Passey Old and New EKG given to Dr Hyacinth Meeker

## 2011-12-08 NOTE — Progress Notes (Signed)
Utilization Review Completed.Aliscia Clayton T4/29/2013   

## 2011-12-08 NOTE — ED Notes (Signed)
Pt continues to report CP 6/10.   Pt updated on POC that she is unable to go upstairs now since she is having active CP that is not changing with dilaudid x 2.

## 2011-12-09 LAB — COMPREHENSIVE METABOLIC PANEL
ALT: 13 U/L (ref 0–35)
AST: 15 U/L (ref 0–37)
Albumin: 3.3 g/dL — ABNORMAL LOW (ref 3.5–5.2)
Alkaline Phosphatase: 112 U/L (ref 39–117)
BUN: 13 mg/dL (ref 6–23)
CO2: 27 mEq/L (ref 19–32)
Calcium: 8.9 mg/dL (ref 8.4–10.5)
Chloride: 100 mEq/L (ref 96–112)
Creatinine, Ser: 0.69 mg/dL (ref 0.50–1.10)
GFR calc Af Amer: >90 mL/min (ref >90)
GFR calc non Af Amer: 89 mL/min — ABNORMAL LOW (ref >90)
Glucose, Bld: 134 mg/dL — ABNORMAL HIGH (ref 70–99)
Potassium: 4.3 mEq/L (ref 3.5–5.1)
Sodium: 137 mEq/L (ref 135–145)
Total Bilirubin: 0.3 mg/dL (ref 0.3–1.2)
Total Protein: 6.2 g/dL (ref 6.0–8.3)

## 2011-12-09 LAB — CARDIAC PANEL(CRET KIN+CKTOT+MB+TROPI)
CK, MB: 2.4 ng/mL (ref 0.3–4.0)
CK, MB: 2.4 ng/mL (ref 0.3–4.0)
Relative Index: INVALID (ref 0.0–2.5)
Relative Index: INVALID (ref 0.0–2.5)
Total CK: 54 U/L (ref 7–177)
Total CK: 57 U/L (ref 7–177)
Troponin I: <0.3 ng/mL (ref <0.30)
Troponin I: <0.3 ng/mL (ref <0.30)

## 2011-12-09 MED ORDER — GI COCKTAIL ~~LOC~~: 30.0000 mL | Freq: Three times a day (TID) | ORAL | Status: DC | PRN

## 2011-12-09 MED ORDER — RAMIPRIL 2.5 MG PO CAPS: 2.5000 mg | ORAL_CAPSULE | Freq: Every day | ORAL | Status: DC

## 2011-12-09 MED ORDER — AMLODIPINE BESYLATE 5 MG PO TABS: 2.5000 mg | ORAL_TABLET | Freq: Every day | ORAL | Status: DC

## 2011-12-09 MED ORDER — ATORVASTATIN CALCIUM 20 MG PO TABS
20.0000 mg | ORAL_TABLET | Freq: Every day | ORAL | Status: DC
  Filled 2011-12-09: qty 1

## 2011-12-09 NOTE — Progress Notes (Signed)
IV fluid bolus started to left ac per orders.

## 2011-12-09 NOTE — Progress Notes (Signed)
Pt BP 90/50 HR 65 after bolus.  Pt without complaints.  [name] text paged with bp.

## 2011-12-09 NOTE — Discharge Summary (Signed)
Patient ID: [name] MRN: [id number] DOB/AGE: 04-23-1946 66 y.o. Primary Care Physician:HUSAIN,KARRAR, MD, MD Admit date: 12/08/2011 Discharge date: 12/09/2011    Discharge Diagnoses:  Atypical chest pain - probably due to acid reflux - improved with GI cocktail  HTN (hypertension) GERD (gastroesophageal reflux disease) Depression Dyslipidemia Hypotension - for now we are holding amlodipine and ramipril Remote hx of CAD with PCI   Medication List  As of 12/09/2011  2:19 PM   START taking these medications         gi cocktail Susp suspension   Take 30 mLs by mouth 3 (three) times daily as needed for indigestion. Shake well.         CHANGE how you take these medications         amLODipine 5 MG tablet   Commonly known as: NORVASC   Take 0.5 tablets (2.5 mg total) by mouth daily.   What changed: dose         CONTINUE taking these medications         aluminum-magnesium hydroxide-simethicone 200-200-20 MG/5ML Susp   Commonly known as: MAALOX      arginine 500 MG tablet      aspirin 81 MG chewable tablet      clonazePAM 0.5 MG tablet   Commonly known as: KLONOPIN      cyclobenzaprine 10 MG tablet   Commonly known as: FLEXERIL      ferrous sulfate 325 (65 FE) MG tablet      gabapentin 300 MG capsule   Commonly known as: NEURONTIN      isosorbide mononitrate 120 MG 24 hr tablet   Commonly known as: IMDUR      nitroGLYCERIN 0.4 MG SL tablet   Commonly known as: NITROSTAT      pantoprazole 40 MG tablet   Commonly known as: PROTONIX      ramipril 2.5 MG capsule   Commonly known as: ALTACE   Take 1 capsule (2.5 mg total) by mouth daily.      ranitidine 150 MG capsule   Commonly known as: ZANTAC      ranolazine 500 MG 12 hr tablet   Commonly known as: RANEXA      simvastatin 40 MG tablet   Commonly known as: ZOCOR      traMADol-acetaminophen 37.5-325 MG per tablet   Commonly known as: ULTRACET      zolpidem 10 MG tablet   Commonly known as: AMBIEN           Where to get your medications    These are the prescriptions that you need to pick up.   You may get these medications from any pharmacy.         gi cocktail Susp suspension         Information on where to get these meds is not yet available. Ask your nurse or doctor.         amLODipine 5 MG tablet   ramipril 2.5 MG capsule            Discharged Condition:good    Consults:none  Significant Diagnostic Studies: Dg Chest 2 View  12/08/2011  *RADIOLOGY REPORT*  Clinical Data: Mid chest pain, some shortness of breath  CHEST - 2 VIEW  Comparison: Chest x-ray of 08/14/2010  Findings: No active infiltrate or effusion is seen.  Mediastinal contours appear stable.  The heart is mildly enlarged and stable. No bony abnormality is seen.  A lower anterior cervical spine fusion plate is again  noted.  IMPRESSION: Stable chest x-ray.  No active lung disease.  Original Report Authenticated By: Juline Patch, M.D.    Lab Results: Results for orders placed during the hospital encounter of 12/08/11 (from the past 48 hour(s))  CBC     Status: Normal   Collection Time   12/08/11  7:20 AM      Component Value Range Comment   WBC 6.7  4.0 - 10.5 (K/uL)    RBC 3.98  3.87 - 5.11 (MIL/uL)    Hemoglobin 12.8  12.0 - 15.0 (g/dL)    HCT 16.1  09.6 - 04.5 (%)    MCV 94.7  78.0 - 100.0 (fL)    MCH 32.2  26.0 - 34.0 (pg)    MCHC 34.0  30.0 - 36.0 (g/dL)    RDW 40.9  81.1 - 91.4 (%)    Platelets 275  150 - 400 (K/uL)   DIFFERENTIAL     Status: Normal   Collection Time   12/08/11  7:20 AM      Component Value Range Comment   Neutrophils Relative 56  43 - 77 (%)    Neutro Abs 3.8  1.7 - 7.7 (K/uL)    Lymphocytes Relative 35  12 - 46 (%)    Lymphs Abs 2.4  0.7 - 4.0 (K/uL)    Monocytes Relative 7  3 - 12 (%)    Monocytes Absolute 0.5  0.1 - 1.0 (K/uL)    Eosinophils Relative 1  0 - 5 (%)    Eosinophils Absolute 0.1  0.0 - 0.7 (K/uL)    Basophils Relative 0  0 - 1 (%)    Basophils  Absolute 0.0  0.0 - 0.1 (K/uL)   BASIC METABOLIC PANEL     Status: Abnormal   Collection Time   12/08/11  7:20 AM      Component Value Range Comment   Sodium 139  135 - 145 (mEq/L)    Potassium 4.3  3.5 - 5.1 (mEq/L)    Chloride 104  96 - 112 (mEq/L)    CO2 28  19 - 32 (mEq/L)    Glucose, Bld 117 (*) 70 - 99 (mg/dL)    BUN 16  6 - 23 (mg/dL)    Creatinine, Ser 7.82  0.50 - 1.10 (mg/dL)    Calcium 9.1  8.4 - 10.5 (mg/dL)    GFR calc non Af Amer 87 (*) >90 (mL/min)    GFR calc Af Amer >90  >90 (mL/min)   CARDIAC PANEL(CRET KIN+CKTOT+MB+TROPI)     Status: Normal   Collection Time   12/08/11  7:20 AM      Component Value Range Comment   Total CK 75  7 - 177 (U/L)    CK, MB 3.8  0.3 - 4.0 (ng/mL)    Troponin I <0.30  <0.30 (ng/mL)    Relative Index RELATIVE INDEX IS INVALID  0.0 - 2.5    CBC     Status: Normal   Collection Time   12/08/11  4:11 PM      Component Value Range Comment   WBC 6.6  4.0 - 10.5 (K/uL)    RBC 4.20  3.87 - 5.11 (MIL/uL)    Hemoglobin 13.5  12.0 - 15.0 (g/dL)    HCT 95.6  21.3 - 08.6 (%)    MCV 95.5  78.0 - 100.0 (fL)    MCH 32.1  26.0 - 34.0 (pg)    MCHC 33.7  30.0 - 36.0 (g/dL)  RDW 14.4  11.5 - 15.5 (%)    Platelets 275  150 - 400 (K/uL)   CREATININE, SERUM     Status: Abnormal   Collection Time   12/08/11  4:11 PM      Component Value Range Comment   Creatinine, Ser 0.88  0.50 - 1.10 (mg/dL)    GFR calc non Af Amer 67 (*) >90 (mL/min)    GFR calc Af Amer 78 (*) >90 (mL/min)   CARDIAC PANEL(CRET KIN+CKTOT+MB+TROPI)     Status: Normal   Collection Time   12/08/11  4:12 PM      Component Value Range Comment   Total CK 60  7 - 177 (U/L)    CK, MB 2.9  0.3 - 4.0 (ng/mL)    Troponin I <0.30  <0.30 (ng/mL)    Relative Index RELATIVE INDEX IS INVALID  0.0 - 2.5    URINE RAPID DRUG SCREEN (HOSP PERFORMED)     Status: Abnormal   Collection Time   12/08/11  5:23 PM      Component Value Range Comment   Opiates POSITIVE (*) NONE DETECTED     Cocaine NONE  DETECTED  NONE DETECTED     Benzodiazepines NONE DETECTED  NONE DETECTED     Amphetamines NONE DETECTED  NONE DETECTED     Tetrahydrocannabinol NONE DETECTED  NONE DETECTED     Barbiturates POSITIVE (*) NONE DETECTED    GLUCOSE, CAPILLARY     Status: Abnormal   Collection Time   12/08/11 11:19 PM      Component Value Range Comment   Glucose-Capillary 136 (*) 70 - 99 (mg/dL)   CARDIAC PANEL(CRET KIN+CKTOT+MB+TROPI)     Status: Normal   Collection Time   12/08/11 11:35 PM      Component Value Range Comment   Total CK 57  7 - 177 (U/L)    CK, MB 2.4  0.3 - 4.0 (ng/mL)    Troponin I <0.30  <0.30 (ng/mL)    Relative Index RELATIVE INDEX IS INVALID  0.0 - 2.5    COMPREHENSIVE METABOLIC PANEL     Status: Abnormal   Collection Time   12/09/11  8:16 AM      Component Value Range Comment   Sodium 137  135 - 145 (mEq/L)    Potassium 4.3  3.5 - 5.1 (mEq/L)    Chloride 100  96 - 112 (mEq/L)    CO2 27  19 - 32 (mEq/L)    Glucose, Bld 134 (*) 70 - 99 (mg/dL)    BUN 13  6 - 23 (mg/dL)    Creatinine, Ser 1.61  0.50 - 1.10 (mg/dL)    Calcium 8.9  8.4 - 10.5 (mg/dL)    Total Protein 6.2  6.0 - 8.3 (g/dL)    Albumin 3.3 (*) 3.5 - 5.2 (g/dL)    AST 15  0 - 37 (U/L)    ALT 13  0 - 35 (U/L)    Alkaline Phosphatase 112  39 - 117 (U/L)    Total Bilirubin 0.3  0.3 - 1.2 (mg/dL)    GFR calc non Af Amer 89 (*) >90 (mL/min)    GFR calc Af Amer >90  >90 (mL/min)   CARDIAC PANEL(CRET KIN+CKTOT+MB+TROPI)     Status: Normal   Collection Time   12/09/11  8:16 AM      Component Value Range Comment   Total CK 54  7 - 177 (U/L)    CK, MB 2.4  0.3 -  4.0 (ng/mL)    Troponin I <0.30  <0.30 (ng/mL)    Relative Index RELATIVE INDEX IS INVALID  0.0 - 2.5     No results found for this or any previous visit (from the past 240 hour(s)).   Hospital Course:  66 yo woman with hx of GERD and hiatal hernia as well as remote hx of CAD presented to the ED with chest pain. She was placed in observation status on telemetry.  She had 3 sets of cardiac enzymes - which were within normal limits. She improved with GI cocktail and PPI. Due to decreased po intake she had brief hypotension when taking all the BP meds. For now we decided to use only Imdur 120 mg daily and to hold acei and CCB. We shall resume the meds slowly over the next 2 weeks.    Discharge Exam: Blood pressure 102/61, pulse 86, temperature 98.1 F (36.7 C), temperature source Oral, resp. rate 16, height 4\' 11"  (1.499 m), weight 57.516 kg (126 lb 12.8 oz), SpO2 100.00%. Alert and oriented x3 CVS: RRR RS: CTAB Abdomen ; soft, NT  Disposition: home  Discharge Orders    Future Orders Please Complete By Expires   Diet - low sodium heart healthy      Increase activity slowly           Signed: Zarahi Fuerst 12/09/2011, 2:19 PM

## 2012-08-11 HISTORY — PX: CARDIAC CATHETERIZATION: SHX172

## 2012-11-04 ENCOUNTER — Other Ambulatory Visit: Payer: Self-pay | Admitting: Internal Medicine

## 2012-11-04 ENCOUNTER — Other Ambulatory Visit: Payer: Self-pay

## 2012-11-04 DIAGNOSIS — Z1231 Encounter for screening mammogram for malignant neoplasm of breast: Secondary | ICD-10-CM

## 2012-11-11 ENCOUNTER — Other Ambulatory Visit: Payer: Self-pay | Admitting: Internal Medicine

## 2012-11-11 DIAGNOSIS — M545 Low back pain, unspecified: Secondary | ICD-10-CM

## 2012-11-11 DIAGNOSIS — M79605 Pain in left leg: Secondary | ICD-10-CM

## 2012-11-17 ENCOUNTER — Ambulatory Visit
Admission: RE | Admit: 2012-11-17 | Discharge: 2012-11-17 | Disposition: A | Discharge status: Home / Self Care | Payer: Medicare Other | Source: Ambulatory Visit | Attending: Internal Medicine | Admitting: Internal Medicine

## 2012-11-17 DIAGNOSIS — M545 Low back pain, unspecified: Secondary | ICD-10-CM

## 2012-11-17 DIAGNOSIS — M79605 Pain in left leg: Secondary | ICD-10-CM

## 2012-12-01 ENCOUNTER — Ambulatory Visit
Admission: RE | Admit: 2012-12-01 | Discharge: 2012-12-01 | Disposition: A | Discharge status: Home / Self Care | Payer: Medicare Other | Source: Ambulatory Visit

## 2012-12-01 DIAGNOSIS — Z1231 Encounter for screening mammogram for malignant neoplasm of breast: Secondary | ICD-10-CM

## 2013-02-21 ENCOUNTER — Emergency Department (HOSPITAL_COMMUNITY): Payer: Medicare Other

## 2013-02-21 ENCOUNTER — Inpatient Hospital Stay (HOSPITAL_COMMUNITY)
Admission: EM | Admit: 2013-02-21 | Discharge: 2013-02-23 | DRG: 287 | Disposition: A | Discharge status: Home / Self Care | Payer: Medicare Other | Attending: Interventional Cardiology | Admitting: Interventional Cardiology

## 2013-02-21 ENCOUNTER — Encounter (HOSPITAL_COMMUNITY): Payer: Self-pay

## 2013-02-21 DIAGNOSIS — Z7982 Long term (current) use of aspirin: Secondary | ICD-10-CM

## 2013-02-21 DIAGNOSIS — Z79899 Other long term (current) drug therapy: Secondary | ICD-10-CM

## 2013-02-21 DIAGNOSIS — F411 Generalized anxiety disorder: Secondary | ICD-10-CM | POA: Diagnosis not present

## 2013-02-21 DIAGNOSIS — F329 Major depressive disorder, single episode, unspecified: Secondary | ICD-10-CM | POA: Diagnosis present

## 2013-02-21 DIAGNOSIS — I2510 Atherosclerotic heart disease of native coronary artery without angina pectoris: Principal | ICD-10-CM | POA: Diagnosis present

## 2013-02-21 DIAGNOSIS — I200 Unstable angina: Secondary | ICD-10-CM | POA: Diagnosis present

## 2013-02-21 DIAGNOSIS — K219 Gastro-esophageal reflux disease without esophagitis: Secondary | ICD-10-CM | POA: Diagnosis present

## 2013-02-21 DIAGNOSIS — I249 Acute ischemic heart disease, unspecified: Secondary | ICD-10-CM

## 2013-02-21 DIAGNOSIS — I214 Non-ST elevation (NSTEMI) myocardial infarction: Secondary | ICD-10-CM

## 2013-02-21 DIAGNOSIS — D649 Anemia, unspecified: Secondary | ICD-10-CM | POA: Diagnosis present

## 2013-02-21 DIAGNOSIS — E785 Hyperlipidemia, unspecified: Secondary | ICD-10-CM | POA: Diagnosis present

## 2013-02-21 DIAGNOSIS — I10 Essential (primary) hypertension: Secondary | ICD-10-CM

## 2013-02-21 DIAGNOSIS — Z9861 Coronary angioplasty status: Secondary | ICD-10-CM

## 2013-02-21 DIAGNOSIS — R0789 Other chest pain: Secondary | ICD-10-CM

## 2013-02-21 DIAGNOSIS — K449 Diaphragmatic hernia without obstruction or gangrene: Secondary | ICD-10-CM | POA: Diagnosis present

## 2013-02-21 DIAGNOSIS — F3289 Other specified depressive episodes: Secondary | ICD-10-CM | POA: Diagnosis present

## 2013-02-21 LAB — CBC
HCT: 41.9 % (ref 36.0–46.0)
Hemoglobin: 14.3 g/dL (ref 12.0–15.0)
MCH: 31.3 pg (ref 26.0–34.0)
MCHC: 34.1 g/dL (ref 30.0–36.0)
MCV: 91.7 fL (ref 78.0–100.0)
Platelets: 282 10*3/uL (ref 150–400)
RBC: 4.57 MIL/uL (ref 3.87–5.11)
RDW: 13.7 % (ref 11.5–15.5)
WBC: 8.1 10*3/uL (ref 4.0–10.5)

## 2013-02-21 LAB — PRO B NATRIURETIC PEPTIDE: Pro B Natriuretic peptide (BNP): 52.7 pg/mL (ref 0–125)

## 2013-02-21 LAB — BASIC METABOLIC PANEL
BUN: 8 mg/dL (ref 6–23)
CO2: 27 mEq/L (ref 19–32)
Calcium: 9.6 mg/dL (ref 8.4–10.5)
Chloride: 103 mEq/L (ref 96–112)
Creatinine, Ser: 0.66 mg/dL (ref 0.50–1.10)
GFR calc Af Amer: >90 mL/min (ref >90)
GFR calc non Af Amer: >90 mL/min (ref >90)
Glucose, Bld: 158 mg/dL — ABNORMAL HIGH (ref 70–99)
Potassium: 4.1 mEq/L (ref 3.5–5.1)
Sodium: 140 mEq/L (ref 135–145)

## 2013-02-21 LAB — POCT I-STAT TROPONIN I
Troponin i, poc: 0 ng/mL (ref 0.00–0.08)
Troponin i, poc: 0.04 ng/mL (ref 0.00–0.08)

## 2013-02-21 MED ORDER — ASPIRIN 81 MG PO CHEW
324.0000 mg | CHEWABLE_TABLET | Freq: Once | ORAL | Status: AC
  Administered 2013-02-22: 324 mg via ORAL
  Filled 2013-02-21: qty 4

## 2013-02-21 MED ORDER — GI COCKTAIL ~~LOC~~
30.0000 mL | Freq: Once | ORAL | Status: AC
  Administered 2013-02-22: 30 mL via ORAL
  Filled 2013-02-21: qty 30

## 2013-02-21 MED ORDER — ONDANSETRON HCL 4 MG/2ML IJ SOLN
4.0000 mg | Freq: Once | INTRAMUSCULAR | Status: AC
  Administered 2013-02-22: 4 mg via INTRAVENOUS
  Filled 2013-02-21: qty 2

## 2013-02-21 MED ORDER — NITROGLYCERIN 0.4 MG SL SUBL
0.4000 mg | SUBLINGUAL_TABLET | SUBLINGUAL | Status: DC | PRN
  Administered 2013-02-22 – 2013-02-23 (×4): 0.4 mg via SUBLINGUAL

## 2013-02-21 NOTE — ED Notes (Signed)
Pt c/o mid-sternum chest pain radiating through to her back, N/V/D, SOB and dizziness starting yesterday am

## 2013-02-21 NOTE — ED Provider Notes (Signed)
History    CSN: [id number] Arrival date & time 02/21/13  1941  None    Chief Complaint  Patient presents with  . Chest Pain   (Consider location/radiation/quality/duration/timing/severity/associated sxs/prior Treatment) Patient is a 67 y.o. female presenting with chest pain. The history is provided by the patient.  Chest Pain She has been having chest pain which is a severe, sharp pain in the left parasternal area with radiation through to the back. She rates pain at 10/10. It started yesterday and resolved and then woke her up at 3 AM and has been constant since then. It is worse when she sits up but is not affected by deep breath. She does have history of a coronary stent. She tried taking her usual dose of aspirin 81 mg and also took 3 nitroglycerin with no relief. She states the nitroglycerin did give her a headache and did burn under her tongue. She had been admitted for chest pain evaluation 14 months ago and states the pain today is worse. Past Medical History  Diagnosis Date  . Hypertension   . Hypercholesteremia   . GERD (gastroesophageal reflux disease)   . Anxiety   . Hiatal hernia   . Anemia   . Blood transfusion   . Coronary artery disease    Past Surgical History  Procedure Laterality Date  . Cardiac stent    . Shoulder surgery    . Breast      right, tumor   . Knee surgery  left   History reviewed. No pertinent family history. History  Substance Use Topics  . Smoking status: Former Smoker -- 1.00 packs/day for 40 years    Types: Cigarettes    Quit date: 10/10/2011  . Smokeless tobacco: Former Neurosurgeon  . Alcohol Use: 0.6 oz/week    1 Cans of beer per week     Comment: occasional   OB History   Grav Para Term Preterm Abortions TAB SAB Ect Mult Living                 Review of Systems  Cardiovascular: Positive for chest pain.  All other systems reviewed and are negative.    Allergies  Review of patient's allergies indicates no known  allergies.  Home Medications   Current Outpatient Rx  Name  Route  Sig  Dispense  Refill  . Alum & Mag Hydroxide-Simeth (GI COCKTAIL) SUSP suspension   Oral   Take 30 mLs by mouth 3 (three) times daily as needed for indigestion. Shake well.   1000 mL   0   . aluminum-magnesium hydroxide-simethicone (MAALOX) 200-200-20 MG/5ML SUSP   Oral   Take 30 mLs by mouth 3 (three) times daily as needed. After meals for indigestion         . amLODipine (NORVASC) 5 MG tablet   Oral   Take 0.5 tablets (2.5 mg total) by mouth daily.           Start on 12/14/11   . arginine 500 MG tablet   Oral   Take 500 mg by mouth daily.         Marland Kitchen aspirin 81 MG chewable tablet   Oral   Chew 81 mg by mouth daily.         . clonazePAM (KLONOPIN) 0.5 MG tablet   Oral   Take 0.5 mg by mouth 2 (two) times daily as needed. For anxiety         . cyclobenzaprine (FLEXERIL) 10 MG tablet  Oral   Take 10 mg by mouth 3 (three) times daily as needed. For muscle pain         . ferrous sulfate 325 (65 FE) MG tablet   Oral   Take 325 mg by mouth daily with breakfast.         . gabapentin (NEURONTIN) 300 MG capsule   Oral   Take 300 mg by mouth 3 (three) times daily.         . isosorbide mononitrate (IMDUR) 120 MG 24 hr tablet   Oral   Take 120 mg by mouth daily.         . nitroGLYCERIN (NITROSTAT) 0.4 MG SL tablet   Sublingual   Place 0.4 mg under the tongue every 5 (five) minutes as needed. For chest pain         . pantoprazole (PROTONIX) 40 MG tablet   Oral   Take 40 mg by mouth daily.         . ramipril (ALTACE) 2.5 MG capsule   Oral   Take 1 capsule (2.5 mg total) by mouth daily.           Resume on 12/22/11   . ranitidine (ZANTAC) 150 MG capsule   Oral   Take 150 mg by mouth 2 (two) times daily.         . ranolazine (RANEXA) 500 MG 12 hr tablet   Oral   Take 500 mg by mouth 2 (two) times daily.         . simvastatin (ZOCOR) 40 MG tablet   Oral   Take 40 mg by  mouth every evening.         . traMADol-acetaminophen (ULTRACET) 37.5-325 MG per tablet   Oral   Take 1 tablet by mouth every 6 (six) hours as needed. For pain         . zolpidem (AMBIEN) 10 MG tablet   Oral   Take 10 mg by mouth at bedtime as needed. For sleep          BP 120/62  Pulse 56  Temp(Src) 98 F (36.7 C) (Oral)  Resp 16  Ht 4\' 11"  (1.499 m)  Wt 126 lb (57.153 kg)  BMI 25.44 kg/m2  SpO2 99% Physical Exam  Nursing note and vitals reviewed.  67 year old female, who appears mildly uncomfortable, but is in no acute distress. Vital signs are significant for bradycardia with heart rate of 56. Oxygen saturation is 99%, which is normal. Head is normocephalic and atraumatic. PERRLA, EOMI. Oropharynx is clear. Neck is nontender and supple without adenopathy or JVD. Back is nontender and there is no CVA tenderness. Lungs are clear without rales, wheezes, or rhonchi. Chest is moderately tender in the left parasternal area. Heart has regular rate and rhythm without murmur. Abdomen is soft, flat, nontender without masses or hepatosplenomegaly and peristalsis is normoactive. Extremities have no cyanosis or edema, full range of motion is present. Skin is warm and dry without rash. Neurologic: Mental status is normal, cranial nerves are intact, there are no motor or sensory deficits.  ED Course  Procedures (including critical care time) Results for orders placed during the hospital encounter of 02/21/13  CBC      Result Value Range   WBC 8.1  4.0 - 10.5 K/uL   RBC 4.57  3.87 - 5.11 MIL/uL   Hemoglobin 14.3  12.0 - 15.0 g/dL   HCT 16.1  09.6 - 04.5 %   MCV 91.7  78.0 - 100.0 fL   MCH 31.3  26.0 - 34.0 pg   MCHC 34.1  30.0 - 36.0 g/dL   RDW 16.1  09.6 - 04.5 %   Platelets 282  150 - 400 K/uL  BASIC METABOLIC PANEL      Result Value Range   Sodium 140  135 - 145 mEq/L   Potassium 4.1  3.5 - 5.1 mEq/L   Chloride 103  96 - 112 mEq/L   CO2 27  19 - 32 mEq/L   Glucose,  Bld 158 (*) 70 - 99 mg/dL   BUN 8  6 - 23 mg/dL   Creatinine, Ser 4.09  0.50 - 1.10 mg/dL   Calcium 9.6  8.4 - 81.1 mg/dL   GFR calc non Af Amer >90  >90 mL/min   GFR calc Af Amer >90  >90 mL/min  PRO B NATRIURETIC PEPTIDE      Result Value Range   Pro B Natriuretic peptide (BNP) 52.7  0 - 125 pg/mL  POCT I-STAT TROPONIN I      Result Value Range   Troponin i, poc 0.00  0.00 - 0.08 ng/mL   Comment 3           POCT I-STAT TROPONIN I      Result Value Range   Troponin i, poc 0.04  0.00 - 0.08 ng/mL   Comment 3           POCT I-STAT TROPONIN I      Result Value Range   Troponin i, poc 0.39 (*) 0.00 - 0.08 ng/mL   Comment NOTIFIED PHYSICIAN     Comment 3            Dg Chest 2 View  02/21/2013   *RADIOLOGY REPORT*  Clinical Data: Chest pain, hypertension.  CHEST - 2 VIEW  Comparison: 12/08/2011  Findings: Cervical fixation hardware noted.  Heart size normal. Lungs clear.  No effusion.  IMPRESSION:  1.  No acute disease   Original Report Authenticated By: D. Andria Rhein, MD     Date: 02/21/2013  Rate: 56  Rhythm: sinus bradycardia  QRS Axis: normal  Intervals: normal  ST/T Wave abnormalities: nonspecific T wave changes  Conduction Disutrbances:none  Narrative Interpretation: Minor nonspecific T wave flattening in the anterolateral and lateral leads. When compared with ECG of 12/08/2011, no significant changes are seen.  Old EKG Reviewed: unchanged   1. Acute coronary syndrome    CRITICAL CARE Performed by: BJYNW,GNFAO Total critical care time: 45 minutes Critical care time was exclusive of separately billable procedures and treating other patients. Critical care was necessary to treat or prevent imminent or life-threatening deterioration. Critical care was time spent personally by me on the following activities: development of treatment plan with patient and/or surrogate as well as nursing, discussions with consultants, evaluation of patient's response to treatment, examination  of patient, obtaining history from patient or surrogate, ordering and performing treatments and interventions, ordering and review of laboratory studies, ordering and review of radiographic studies, pulse oximetry and re-evaluation of patient's condition.   MDM  Chest pain which seems somewhat atypical. However, patient has known history of coronary artery disease. Review of past records his multiple hospital admissions for chest pain which have all had negative workups. Last cardiac catheterization but I could find was in January of 2004 at which point there was a stent present in the right coronary artery with a 20-30% stenosis, and a 30-40% stenosis and a diagonal branch off of  the LAD and no other significant coronary disease. She did have spasm of the right coronary artery with patient and the catheter. Initial troponin and ECG are unremarkable. However, it given her history and the fact that there was residual coronary artery disease 10 years ago, she likely will need to be admitted for rule out myocardial infarction.  2:01 AM She got partial relief of pain with GI cocktail not completely. She will be given nitroglycerin.  4:29 AM She got no relief with nitroglycerin and was given morphine. Second troponin came back in the normal range but had increased from 0.0 to 0.04. The troponin has come back 0.39. Pharmacy is been consulted to initiate heparin treatment and case is discussed with Dr. Shirlee Latch of cardiology who agrees to admit the patient.  Dione Booze, MD 02/22/13 [phone]

## 2013-02-21 NOTE — ED Notes (Signed)
Pt found sleeping in wheelchair in lobby.  Pt wheeled to room in wheelchair, and able to ambulate from chair to bed without assistance.  Pt provided gown and asked to change into.

## 2013-02-22 ENCOUNTER — Encounter (HOSPITAL_COMMUNITY)
Admission: EM | Disposition: A | Discharge status: Home / Self Care | Payer: Self-pay | Source: Home / Self Care | Attending: Interventional Cardiology

## 2013-02-22 ENCOUNTER — Encounter (HOSPITAL_COMMUNITY): Payer: Self-pay | Admitting: *Deleted

## 2013-02-22 DIAGNOSIS — I214 Non-ST elevation (NSTEMI) myocardial infarction: Secondary | ICD-10-CM

## 2013-02-22 HISTORY — PX: LEFT HEART CATHETERIZATION WITH CORONARY ANGIOGRAM: SHX5451

## 2013-02-22 LAB — MRSA PCR SCREENING: MRSA by PCR: NEGATIVE

## 2013-02-22 LAB — TROPONIN I
Troponin I: 0.81 ng/mL (ref <0.30)
Troponin I: 0.9 ng/mL (ref <0.30)
Troponin I: 0.32 ng/mL (ref <0.30)

## 2013-02-22 LAB — TSH: TSH: 1.116 u[IU]/mL (ref 0.350–4.500)

## 2013-02-22 LAB — POCT I-STAT TROPONIN I: Troponin i, poc: 0.39 ng/mL (ref 0.00–0.08)

## 2013-02-22 LAB — HEPARIN LEVEL (UNFRACTIONATED): Heparin Unfractionated: 0.39 IU/mL (ref 0.30–0.70)

## 2013-02-22 SURGERY — LEFT HEART CATHETERIZATION WITH CORONARY ANGIOGRAM
Anesthesia: LOCAL

## 2013-02-22 MED ORDER — ASPIRIN EC 81 MG PO TBEC: 81.0000 mg | DELAYED_RELEASE_TABLET | Freq: Every day | ORAL | Status: DC

## 2013-02-22 MED ORDER — SODIUM CHLORIDE 0.9 % IV SOLN: INTRAVENOUS | Status: AC

## 2013-02-22 MED ORDER — NITROGLYCERIN IN D5W 200-5 MCG/ML-% IV SOLN
2.0000 ug/min | INTRAVENOUS | Status: DC
  Administered 2013-02-22: 10 ug/min via INTRAVENOUS
  Administered 2013-02-22: 20 ug/min via INTRAVENOUS

## 2013-02-22 MED ORDER — MIDAZOLAM HCL 2 MG/2ML IJ SOLN
INTRAMUSCULAR | Status: AC
  Filled 2013-02-22: qty 2

## 2013-02-22 MED ORDER — ONDANSETRON HCL 4 MG/2ML IJ SOLN: 4.0000 mg | Freq: Four times a day (QID) | INTRAMUSCULAR | Status: DC | PRN

## 2013-02-22 MED ORDER — SODIUM CHLORIDE 0.9 % IV SOLN: 250.0000 mL | INTRAVENOUS | Status: DC | PRN

## 2013-02-22 MED ORDER — ASPIRIN 81 MG PO CHEW
81.0000 mg | CHEWABLE_TABLET | Freq: Every day | ORAL | Status: DC
  Administered 2013-02-23: 10:00:00 81 mg via ORAL
  Filled 2013-02-22: qty 1

## 2013-02-22 MED ORDER — SODIUM CHLORIDE 0.9 % IV SOLN: 1.0000 mL/kg/h | INTRAVENOUS | Status: DC

## 2013-02-22 MED ORDER — NITROGLYCERIN 0.2 MG/ML ON CALL CATH LAB
INTRAVENOUS | Status: AC
  Filled 2013-02-22: qty 1

## 2013-02-22 MED ORDER — ZOLPIDEM TARTRATE 5 MG PO TABS: 5.0000 mg | ORAL_TABLET | Freq: Every evening | ORAL | Status: DC | PRN

## 2013-02-22 MED ORDER — FERROUS SULFATE 325 (65 FE) MG PO TABS
325.0000 mg | ORAL_TABLET | Freq: Every day | ORAL | Status: DC
  Administered 2013-02-23: 09:00:00 325 mg via ORAL
  Filled 2013-02-22 (×3): qty 1

## 2013-02-22 MED ORDER — MORPHINE SULFATE 4 MG/ML IJ SOLN
4.0000 mg | Freq: Once | INTRAMUSCULAR | Status: AC
  Administered 2013-02-22: 4 mg via INTRAVENOUS
  Filled 2013-02-22: qty 1

## 2013-02-22 MED ORDER — MORPHINE SULFATE 2 MG/ML IJ SOLN
2.0000 mg | INTRAMUSCULAR | Status: DC | PRN
  Administered 2013-02-22: 20:00:00 2 mg via INTRAVENOUS
  Filled 2013-02-22: qty 1

## 2013-02-22 MED ORDER — ASPIRIN 81 MG PO CHEW: 324.0000 mg | CHEWABLE_TABLET | ORAL | Status: DC

## 2013-02-22 MED ORDER — AMLODIPINE BESYLATE 5 MG PO TABS
5.0000 mg | ORAL_TABLET | Freq: Every day | ORAL | Status: DC
  Administered 2013-02-22 – 2013-02-23 (×2): 5 mg via ORAL
  Filled 2013-02-22 (×2): qty 1

## 2013-02-22 MED ORDER — NITROGLYCERIN 0.4 MG SL SUBL: 0.4000 mg | SUBLINGUAL_TABLET | SUBLINGUAL | Status: DC | PRN

## 2013-02-22 MED ORDER — SODIUM CHLORIDE 0.9 % IJ SOLN: 3.0000 mL | Freq: Two times a day (BID) | INTRAMUSCULAR | Status: DC

## 2013-02-22 MED ORDER — GABAPENTIN 300 MG PO CAPS
300.0000 mg | ORAL_CAPSULE | Freq: Three times a day (TID) | ORAL | Status: DC
  Administered 2013-02-22 – 2013-02-23 (×3): 300 mg via ORAL
  Filled 2013-02-22 (×7): qty 1

## 2013-02-22 MED ORDER — TRAMADOL-ACETAMINOPHEN 37.5-325 MG PO TABS
1.0000 | ORAL_TABLET | Freq: Four times a day (QID) | ORAL | Status: DC | PRN
  Filled 2013-02-22: qty 1

## 2013-02-22 MED ORDER — RANOLAZINE ER 500 MG PO TB12
500.0000 mg | ORAL_TABLET | Freq: Two times a day (BID) | ORAL | Status: DC
  Administered 2013-02-22 – 2013-02-23 (×3): 500 mg via ORAL
  Filled 2013-02-22 (×5): qty 1

## 2013-02-22 MED ORDER — PANTOPRAZOLE SODIUM 40 MG PO TBEC
40.0000 mg | DELAYED_RELEASE_TABLET | Freq: Every day | ORAL | Status: DC
  Administered 2013-02-23: 11:00:00 40 mg via ORAL
  Filled 2013-02-22: qty 1

## 2013-02-22 MED ORDER — CITALOPRAM HYDROBROMIDE 20 MG PO TABS
20.0000 mg | ORAL_TABLET | Freq: Every day | ORAL | Status: DC
  Administered 2013-02-23: 20 mg via ORAL
  Filled 2013-02-22 (×2): qty 1

## 2013-02-22 MED ORDER — HEPARIN (PORCINE) IN NACL 100-0.45 UNIT/ML-% IJ SOLN
700.0000 [IU]/h | INTRAMUSCULAR | Status: DC
  Administered 2013-02-22: 700 [IU]/h via INTRAVENOUS
  Filled 2013-02-22: qty 250

## 2013-02-22 MED ORDER — ATORVASTATIN CALCIUM 80 MG PO TABS
80.0000 mg | ORAL_TABLET | Freq: Every day | ORAL | Status: DC
  Filled 2013-02-22 (×2): qty 1

## 2013-02-22 MED ORDER — ASPIRIN 300 MG RE SUPP: 300.0000 mg | RECTAL | Status: DC

## 2013-02-22 MED ORDER — CLONAZEPAM 0.5 MG PO TABS: 0.5000 mg | ORAL_TABLET | Freq: Two times a day (BID) | ORAL | Status: DC | PRN

## 2013-02-22 MED ORDER — NITROGLYCERIN IN D5W 200-5 MCG/ML-% IV SOLN
2.0000 ug/min | INTRAVENOUS | Status: DC
  Filled 2013-02-22: qty 250

## 2013-02-22 MED ORDER — ZOLPIDEM TARTRATE 5 MG PO TABS
5.0000 mg | ORAL_TABLET | Freq: Once | ORAL | Status: AC
  Administered 2013-02-22: 5 mg via ORAL
  Filled 2013-02-22: qty 1

## 2013-02-22 MED ORDER — RAMIPRIL 2.5 MG PO CAPS
2.5000 mg | ORAL_CAPSULE | Freq: Every day | ORAL | Status: DC
Start: 2013-02-22 — End: 2013-02-23
  Administered 2013-02-22 – 2013-02-23 (×2): 2.5 mg via ORAL
  Filled 2013-02-22 (×2): qty 1

## 2013-02-22 MED ORDER — FENTANYL CITRATE 0.05 MG/ML IJ SOLN
INTRAMUSCULAR | Status: AC
  Filled 2013-02-22: qty 2

## 2013-02-22 MED ORDER — LIDOCAINE HCL (PF) 1 % IJ SOLN
INTRAMUSCULAR | Status: AC
  Filled 2013-02-22: qty 30

## 2013-02-22 MED ORDER — HEPARIN BOLUS VIA INFUSION
3000.0000 [IU] | Freq: Once | INTRAVENOUS | Status: AC
  Administered 2013-02-22: 3000 [IU] via INTRAVENOUS

## 2013-02-22 MED ORDER — SODIUM CHLORIDE 0.9 % IJ SOLN: 3.0000 mL | INTRAMUSCULAR | Status: DC | PRN

## 2013-02-22 MED ORDER — HEPARIN (PORCINE) IN NACL 2-0.9 UNIT/ML-% IJ SOLN
INTRAMUSCULAR | Status: AC
  Filled 2013-02-22: qty 1000

## 2013-02-22 MED ORDER — ACETAMINOPHEN 325 MG PO TABS: 650.0000 mg | ORAL_TABLET | ORAL | Status: DC | PRN

## 2013-02-22 MED ORDER — MORPHINE SULFATE 2 MG/ML IJ SOLN
1.0000 mg | INTRAMUSCULAR | Status: DC | PRN
  Administered 2013-02-22: 2 mg via INTRAVENOUS
  Filled 2013-02-22: qty 1

## 2013-02-22 NOTE — Care Management Note (Signed)
    Page 1 of 1   02/22/2013     8:59:59 AM   CARE MANAGEMENT NOTE 02/22/2013  Patient:  [name], [name]   Account Number:  [id number]  Date Initiated:  02/22/2013  Documentation initiated by:  Junius Creamer  Subjective/Objective Assessment:   adm w mi     Action/Plan:   lives w husband, pcp dr Kirtland Bouchard hussain   Anticipated DC Date:     Anticipated DC Plan:        DC Planning Services  CM consult      Choice offered to / List presented to:             Status of service:   Medicare Important Message given?   (If response is "NO", the following Medicare IM given date fields will be blank) Date Medicare IM given:   Date Additional Medicare IM given:    Discharge Disposition:    Per UR Regulation:  Reviewed for med. necessity/level of care/duration of stay  If discussed at Long Length of Stay Meetings, dates discussed:    Comments:

## 2013-02-22 NOTE — Progress Notes (Signed)
ANTICOAGULATION CONSULT NOTE - Initial Consult  Pharmacy Consult for Heparin Indication: chest pain/ACS  No Known Allergies  Patient Measurements: Height: 4\' 11"  (149.9 cm) Weight: 126 lb (57.153 kg) IBW/kg (Calculated) : 43.2  Vital Signs: Temp: 98 F (36.7 C) (07/14 1944) Temp src: Oral (07/14 1944) BP: 100/68 mmHg (07/15 0300) Pulse Rate: 74 (07/15 0251)  Labs:  Recent Labs  02/21/13 1950  HGB 14.3  HCT 41.9  PLT 282  CREATININE 0.66    Estimated Creatinine Clearance: 53.3 ml/min (by C-G formula based on Cr of 0.66).   Medical History: Past Medical History  Diagnosis Date  . Hypertension   . Hypercholesteremia   . GERD (gastroesophageal reflux disease)   . Anxiety   . Hiatal hernia   . Anemia   . Blood transfusion   . Coronary artery disease     Medications:  Norvasc  ASA  Celexa  Klonopin  FLexeril  Iron  Neurontin  Imdur  Ntg  Protonix  Altace  Zantac  Ranexa  Zocor  Ultracet  Ambien  Assessment: 67 y.o. female with chest pain for heparin   Goal of Therapy:  Heparin level 0.3-0.7 units/ml Monitor platelets by anticoagulation protocol: Yes   Plan:  Heparin 3000 units IV bolus, then 700 units/hr Check heparin level in 6 hours.  Eddie Candle 02/22/2013,4:22 AM

## 2013-02-22 NOTE — Progress Notes (Addendum)
Most recent catheterization 7 years ago revealed diffuse coronary artery spasm under similar circumstances. EKGs did not reveal an evolutionary changes of myocardial infarction. The troponin is elevated. He will be reasonable to recap the patient this morning. I will try to see if he if able to perform later today. I am stuck in the office all day.  The procedure and possible risks including stroke, death, myocardial infarction, bleeding, kidney injury, allergy, limb ischemia, among others were discussed in detail and accepted by the patient.

## 2013-02-22 NOTE — ED Notes (Signed)
I stat troponin results given to Dr. Preston Fleeting by B. Bing Plume, EMT

## 2013-02-22 NOTE — Progress Notes (Signed)
No change in clinical status since this morning's note. The patient has consented to the procedure to

## 2013-02-22 NOTE — H&P (Signed)
Physician History and Physical    [name] MRN: [id number] DOB/AGE: 12/19/1945 67 y.o. Admit date: 02/21/2013  Primary Cardiologist: Dr. Katrinka Blazing  HPI: 67 yo with history of CAD s/p remote stenting to RCA and PTCA LAD but multiple episodes of atypical chest pain presumed GI-related versus coronary vasospasm since that time presented tonight to the ER for evaluation of chest pain.  Chest pain began on Sunday. It was substernal radiating to the back with burning in her neck. She has had similar episodes of chest pain fairly frequently in the past.  She had about 3 episodes of this pain on Sunday, each lasting for a matter of minutes.  One episode seemed to respond to sublingual NTG.  She thought that this was GERD.  On Monday around noon, she developed similar pain that was more severe and associated with nausea.  The pain persisted, waxing and waning for the rest of the day.  The pain got worse tonight so she came to the ER.  The pain was worse sitting up and better lying down.  It was not pleuritic.  ECG was not significantly changed compared to prior with slight T wave inversions laterally.  Initially in ER, her pain was thought to be GI but serial POC troponins though initially negative rose to 0.81.  In the ER, her pain improved after getting a GI cocktail and improved again with morphine.  NTG did not seem to have a marked effect.  Pain improved from 9/10 to about 4/10.    PMH: 1. CAD: Remote stent to RCA and PTCA LAD.  Last cath in 11/03 with nonobstructive disease, RCA stent patent.  Last echo in [facility] system in 2005 with EF 50%.  She has had a history of atypical chest pain of uncertain etiology thought to be perhaps GI versus coronary vasospasm.  2. HTN 3. GERD with hiatal hernia 4. Anemia 5. Hyperlipidemia 6. Depression  Review of systems complete and found to be negative unless listed above   FH: No premature CAD  History   Social History  . Marital Status: Married    Spouse  Name: N/A    Number of Children: N/A  . Years of Education: N/A   Occupational History  . Not on file.   Social History Main Topics  . Smoking status: Former Smoker -- 1.00 packs/day for 40 years    Types: Cigarettes    Quit date: 10/10/2011  . Smokeless tobacco: Former Neurosurgeon  . Alcohol Use: 0.6 oz/week    1 Cans of beer per week     Comment: occasional  . Drug Use: No  . Sexually Active: No   Other Topics Concern  . Not on file   Social History Narrative  . No narrative on file    Current Facility-Administered Medications  Medication Dose Route Frequency Provider Last Rate Last Dose  . heparin ADULT infusion 100 units/mL (25000 units/250 mL)  700 Units/hr Intravenous Continuous Dione Booze, MD      . heparin bolus via infusion 3,000 Units  3,000 Units Intravenous Once Dione Booze, MD      . nitroGLYCERIN (NITROSTAT) SL tablet 0.4 mg  0.4 mg Sublingual Q5 min PRN Dione Booze, MD   0.4 mg at 02/22/13 4098   Current Outpatient Prescriptions  Medication Sig Dispense Refill  . aluminum-magnesium hydroxide-simethicone (MAALOX) 200-200-20 MG/5ML SUSP Take 30 mLs by mouth 3 (three) times daily as needed. After meals for indigestion      . amLODipine (NORVASC) 5  MG tablet Take 5 mg by mouth daily.      Marland Kitchen arginine 500 MG tablet Take 500 mg by mouth daily.      Marland Kitchen aspirin 81 MG chewable tablet Chew 81 mg by mouth daily.      . citalopram (CELEXA) 20 MG tablet Take 20 mg by mouth daily.      . clonazePAM (KLONOPIN) 0.5 MG tablet Take 0.5 mg by mouth 2 (two) times daily as needed. For anxiety      . cyclobenzaprine (FLEXERIL) 10 MG tablet Take 10 mg by mouth 3 (three) times daily as needed. For muscle pain      . ferrous sulfate 325 (65 FE) MG tablet Take 325 mg by mouth daily with breakfast.      . gabapentin (NEURONTIN) 300 MG capsule Take 300 mg by mouth 3 (three) times daily.      . isosorbide mononitrate (IMDUR) 120 MG 24 hr tablet Take 120 mg by mouth daily.      . nitroGLYCERIN  (NITROSTAT) 0.4 MG SL tablet Place 0.4 mg under the tongue every 5 (five) minutes as needed. For chest pain      . pantoprazole (PROTONIX) 40 MG tablet Take 40 mg by mouth daily.      . ramipril (ALTACE) 2.5 MG capsule Take 1 capsule (2.5 mg total) by mouth daily.      . ranitidine (ZANTAC) 150 MG capsule Take 150 mg by mouth 2 (two) times daily.      . ranolazine (RANEXA) 500 MG 12 hr tablet Take 500 mg by mouth 2 (two) times daily.      . simvastatin (ZOCOR) 40 MG tablet Take 40 mg by mouth every evening.      . traMADol-acetaminophen (ULTRACET) 37.5-325 MG per tablet Take 1 tablet by mouth every 6 (six) hours as needed. For pain      . zolpidem (AMBIEN) 10 MG tablet Take 10 mg by mouth at bedtime as needed. For sleep        Physical Exam: Blood pressure 100/68, pulse 74, temperature 98 F (36.7 C), temperature source Oral, resp. rate 20, height 4\' 11"  (1.499 m), weight 57.153 kg (126 lb), SpO2 100.00%.  General: NAD Neck: No JVD, no thyromegaly or thyroid nodule.  Lungs: Clear to auscultation bilaterally with normal respiratory effort. CV: Nondisplaced PMI.  Heart regular S1/S2, no S3/S4, no murmur.  No peripheral edema.  No carotid bruit.  Normal pedal pulses.  Abdomen: Soft, nontender, no hepatosplenomegaly, no distention.  Skin: Intact without lesions or rashes.  Neurologic: Alert and oriented x 3.  Psych: Normal affect. Extremities: No clubbing or cyanosis.  HEENT: Normal.   Labs:   Lab Results  Component Value Date   WBC 8.1 02/21/2013   HGB 14.3 02/21/2013   HCT 41.9 02/21/2013   MCV 91.7 02/21/2013   PLT 282 02/21/2013    Recent Labs Lab 02/21/13 1950  NA 140  K 4.1  CL 103  CO2 27  BUN 8  CREATININE 0.66  CALCIUM 9.6  GLUCOSE 158*  TnI: 0 => 0.04 => 0.39 => 0.81 BNP 53   Radiology: - CXR: No acute finding, no mediastinal widening.  EKG: NSR, slight T wave inversions in I, AVL, V6; mildly prolonged QTc at 478 msec (not much changed from 4/13)  ASSESSMENT AND  PLAN:  67 yo with history of CAD s/p remote stenting to RCA and PTCA LAD but multiple episodes of atypical chest pain presumed GI-related versus coronary vasospasm since  that time presented tonight to the ER for evaluation of chest pain. Troponin was found to be elevated suggestive of NSTEMI.  1. NSTEMI: Elevated troponin to 0.81 with prolonged chest pain x 12+ hours tonight.  Character of pain was somewhat atypical and consistent with prior presumed noncardiac chest pain, but TnI is elevated. ECG shows some mild T wave inversions but not markedly changed from prior.  Cannot rule out vasospasm (this has been a proposed etiology for her CP in the past) but she has known CAD and now elevated TnI so needs invasive evaluation.  - LHC later this morning, will enter pre-cath orders.  - Start heparin gtt and give ASA, statin.  - Will begin NTG gtt to see if this helps with pain.  - Echocardiogram.  2. Mild QT prolongation: Suspect that this is secondary to ranolazine use.  QTc < 500 msec so suspect will be ok to continue this medication if desired.   Signed: Marca Ancona 02/22/2013, 4:43 AM

## 2013-02-22 NOTE — Progress Notes (Signed)
ANTICOAGULATION CONSULT NOTE - Follow Up Consult  Pharmacy Consult for Heparin Indication: chest pain/ACS  No Known Allergies  Patient Measurements: Height: 4\' 11"  (149.9 cm) Weight: 126 lb 12.2 oz (57.5 kg) IBW/kg (Calculated) : Jillian.2  Vital Signs: Temp: 98 F (36.7 C) (07/15 1100) Temp src: Oral (07/15 1100) BP: 97/81 mmHg (07/15 1200) Pulse Rate: 61 (07/15 1100)  Labs:  Recent Labs  02/21/13 1950 02/22/13 0315 02/22/13 0750 02/22/13 1155 02/22/13 1200  HGB 14.3  --   --   --   --   HCT 41.9  --   --   --   --   PLT 282  --   --   --   --   HEPARINUNFRC  --   --   --   --  0.39  CREATININE 0.66  --   --   --   --   TROPONINI  --  0.81* 0.90* 0.32*  --     Estimated Creatinine Clearance: 53.4 ml/min (by C-G formula based on Cr of 0.66).   Medical History: Past Medical History  Diagnosis Date  . Hypertension   . Hypercholesteremia   . GERD (gastroesophageal reflux disease)   . Anxiety   . Hiatal hernia   . Anemia   . Blood transfusion   . Coronary artery disease     Medications:  Prescriptions prior to admission  Medication Sig Dispense Refill  . aluminum-magnesium hydroxide-simethicone (MAALOX) 200-200-20 MG/5ML SUSP Take 30 mLs by mouth 3 (three) times daily as needed. After meals for indigestion      . amLODipine (NORVASC) 5 MG tablet Take 5 mg by mouth daily.      Marland Kitchen arginine 500 MG tablet Take 500 mg by mouth daily.      Marland Kitchen aspirin 81 MG chewable tablet Chew 81 mg by mouth daily.      . citalopram (CELEXA) 20 MG tablet Take 20 mg by mouth daily.      . clonazePAM (KLONOPIN) 0.5 MG tablet Take 0.5 mg by mouth 2 (two) times daily as needed. For anxiety      . cyclobenzaprine (FLEXERIL) 10 MG tablet Take 10 mg by mouth 3 (three) times daily as needed. For muscle pain      . ferrous sulfate 325 (65 FE) MG tablet Take 325 mg by mouth daily with breakfast.      . gabapentin (NEURONTIN) 300 MG capsule Take 300 mg by mouth 3 (three) times daily.      .  isosorbide mononitrate (IMDUR) 120 MG 24 hr tablet Take 120 mg by mouth daily.      . nitroGLYCERIN (NITROSTAT) 0.4 MG SL tablet Place 0.4 mg under the tongue every 5 (five) minutes as needed. For chest pain      . pantoprazole (PROTONIX) 40 MG tablet Take 40 mg by mouth daily.      . ramipril (ALTACE) 2.5 MG capsule Take 1 capsule (2.5 mg total) by mouth daily.      . ranitidine (ZANTAC) 150 MG capsule Take 150 mg by mouth 2 (two) times daily.      . ranolazine (RANEXA) 500 MG 12 hr tablet Take 500 mg by mouth 2 (two) times daily.      . simvastatin (ZOCOR) 40 MG tablet Take 40 mg by mouth every evening.      . traMADol-acetaminophen (ULTRACET) 37.5-325 MG per tablet Take 1 tablet by mouth every 6 (six) hours as needed. For pain      .  zolpidem (AMBIEN) 10 MG tablet Take 10 mg by mouth at bedtime as needed. For sleep        Assessment: [name] admitted 02/21/2013  with chest pain. Pharmacy consulted to dose heparin.  PMH: CAD, stent 03, HTN, GERD, anemia, hyperlipidemia, depression  Coag: NSTEMI> on heparin, plan for cath today CV: CAD, HTN, hyperlipidemia: amlodipine, asa, atorv, ramipril, ranolizine Neuro: depression, citalopram, gabapentin  Goal of Therapy:  Heparin level 0.3-0.7 units/ml Monitor platelets by anticoagulation protocol: Yes   Plan:  Continue heparin at 700 units/hr. To turn off for cath. Follow up plan for heparin after cath   Thank you for allowing pharmacy to be a part of this patients care team.  Lovenia Kim Pharm.D., BCPS Clinical Pharmacist 02/22/2013 1:08 PM Pager: [phone] Phone: [phone]

## 2013-02-22 NOTE — CV Procedure (Signed)
     Diagnostic Cardiac Catheterization Report  [name]  67 y.o.  female [birth year]  Procedure Date: 02/22/2013 Referring Physician: Josefa Half, MD Primary Cardiologist: h.sMITH, iii, md   PROCEDURE:  Left heart catheterization with selective coronary angiography, left ventriculogram.  INDICATIONS:  ACS  The risks, benefits, and details of the procedure were explained to the patient.  The patient verbalized understanding and wanted to proceed.  Informed written consent was obtained.  PROCEDURE TECHNIQUE:  After Xylocaine anesthesia a 4 French sheath was placed in the right femoral artery with a single anterior needle wall stick.   Coronary angiography was done using a 4 [place] JR 4 and JL 4 catheter.  Left ventriculography was done using a JR 4 catheter by hand inject.    CONTRAST:  Total of 65 cc.  COMPLICATIONS:  None.    HEMODYNAMICS:  Aortic pressure was 97 over 52 mmHg; LV pressure was 100/6; LVEDP 13.  There was no gradient between the left ventricle and aorta.    ANGIOGRAPHIC DATA:   The left main coronary artery is widely patent. Calcification is noted.  The left anterior descending artery is 40% proximal stenosis at the first diagonal. The first diagonal contains an eccentric 50% ostial narrowing.  The left circumflex artery is widely patent with a dominant obtuse marginal branch it increases significantly in size after intracoronary nitroglycerin..  The right coronary artery is dominant with no significant obstruction. Widely patent mid vessel stent .  LEFT VENTRICULOGRAM:  Left ventricular angiogram was done in the 30 RAO projection and revealed normal left ventricular wall motion and systolic function with an estimated ejection fraction of 50 %.  LVEDP was 13 mmHg.  IMPRESSIONS:  1. Generalized left coronary vasoconstriction relieved with intracoronary nitroglycerin  2. 40% proximal LAD an eccentric 50% diagonal #1.  3. Widely patent circumflex and RCA  including the RCA stent  4. Low normal LV systolic function   RECOMMENDATION:  Discontinue IV nitroglycerin. Continue antiplatelet therapy. Ambulate later today. Consider discharge later this evening or in a.m.Marland Kitchen

## 2013-02-22 NOTE — Progress Notes (Signed)
Echocardiogram 2D Echocardiogram has been performed.  [name] 02/22/2013, 9:55 AM

## 2013-02-23 DIAGNOSIS — I249 Acute ischemic heart disease, unspecified: Secondary | ICD-10-CM | POA: Diagnosis present

## 2013-02-23 LAB — POCT ACTIVATED CLOTTING TIME: Activated Clotting Time: 140 seconds

## 2013-02-23 MED ORDER — ISOSORBIDE MONONITRATE ER 60 MG PO TB24
120.0000 mg | ORAL_TABLET | Freq: Every day | ORAL | Status: DC
  Administered 2013-02-23: 120 mg via ORAL
  Filled 2013-02-23: qty 2

## 2013-02-23 MED ORDER — ACTIVE PARTNERSHIP FOR HEALTH OF YOUR HEART BOOK
Freq: Once | Status: AC
  Administered 2013-02-23: 01:00:00
  Filled 2013-02-23: qty 1

## 2013-02-23 NOTE — Progress Notes (Signed)
CARDIAC REHAB PHASE I   PRE:  Rate/Rhythm: 60 SR  BP:  Supine: 113/55  Sitting:   Standing:    SaO2:   MODE:  Ambulation: 500 ft   POST:  Rate/Rhythm: 84SR  BP:  Supine:   Sitting: 142/59  Standing:    SaO2:  0850-0940 Pt had burning in chest at 4/10 prior to walk which maintained a 4 during walk. Also c/o nausea, headache and weakness. Walked 500 ft with hand held asst stopping to rest once. Upon return to room, pt c/o feeling weak like she might pass out. She stated she felt like this at home too. Asked pt if she was home, would she take a NTG tablet for this burning and she said yes. Asked pt's RN to give pt NTG tab which decreased from 4 to 3/10. Education completed and pt gave permission to refer to [place] Phase 2. Encouraged pt to walk with her RN later to see how she tolerated. She may need another day. Pt stated she felt like she may need another day. No further c/o weakness or passing out once lying in bed.    Luetta Nutting, RN BSN  02/23/2013 9:35 AM

## 2013-02-23 NOTE — Discharge Summary (Signed)
Patient ID: [name] MRN: [id number] DOB/AGE: 1945/08/18 67 y.o.  Admit date: 02/21/2013 Discharge date: 02/23/2013 Patient Active Problem List   Diagnosis Date Noted  . Acute coronary syndrome 02/23/2013    Priority: High    Class: Acute  . Atypical chest pain 12/08/2011  . HTN (hypertension) 12/08/2011  . GERD (gastroesophageal reflux disease) 12/08/2011  . Depression 12/08/2011  . Dyslipidemia 12/08/2011    Primary Discharge Diagnosis: Acute coronary syndrome presumed secondary to Coronary spasm as documented on prior admissions, most recently 2008  Secondary Discharge Diagnosis: Chronic stable CAD with widely patent RCA stent and 60-70% diagonal #1 stenosis  Gastroesophageal reflux disease  Hypertension  Anxiety/depression  Significant Diagnostic Studies: Coronary angiography, 02/23/39  Consults: None  Hospital Course: The patient presented with prolonged severe chest discomfort but no acute EKG changes. Serial cardiac markers demonstrated a very low but definite positivity and troponin I ischemic markers. The chest discomfort was nearly continuous despite IV nitroglycerin. The patient's had prior similar presentations and on a least 2 occasions catheterization demonstrated focal and diffuse spasm. The most recent such presentation was 2008 she presented with inferior ST elevation and a catheterization was found to have a diffusely in constrictive right coronary that more than doubled in size after intracoronary nitroglycerin.  On this occasion after the elevator markers were noted, she underwent coronary angiography which again demonstrated generalized left coronary vasoconstriction that was markedly improved with intracoronary nitroglycerin. After nitroglycerin administration no focal stenoses of greater than 70% was noted. She did have a focal 40-50% proximal LAD a 70% ostial diagonal 1. This anatomy is unchanged since 2008.  Left ventriculogram as well as  echocardiogram demonstrated normal LV function. No paracardial disease was noted.  On the morning after the procedure she still complaining of burning chest discomfort but with normal EKG. With ambulation there was no worsening of chest discomfort. It is likely that the burning discomfort is related to esophageal reflux. The patient while with cardiac rehabilitation without worsening in symptoms and was felt to be eligible for discharge.   Discharge Exam: Blood pressure 99/57, pulse 62, temperature 97.9 F (36.6 C), temperature source Oral, resp. rate 20, height 4\' 11"  (1.499 m), weight 58 kg (127 lb 13.9 oz), SpO2 96.00%.    The right groin cath site is unremarkable  The cardiac exam does not reveal pericardial rub or other abnormality  Lungs clear auscultation and percussion Labs:   Lab Results  Component Value Date   WBC 8.1 02/21/2013   HGB 14.3 02/21/2013   HCT 41.9 02/21/2013   MCV 91.7 02/21/2013   PLT 282 02/21/2013    Recent Labs Lab 02/21/13 1950  NA 140  K 4.1  CL 103  CO2 27  BUN 8  CREATININE 0.66  CALCIUM 9.6  GLUCOSE 158*   Lab Results  Component Value Date   CKTOTAL 54 12/09/2011   CKMB 2.4 12/09/2011   TROPONINI 0.32* 02/22/2013    Lab Results  Component Value Date   CHOL  Value: 120        ATP III CLASSIFICATION:  <200     mg/dL   Desirable  409-811  mg/dL   Borderline High  >=914    mg/dL   High        02/16/2955   CHOL  Value: 123        ATP III CLASSIFICATION:  <200     mg/dL   Desirable  213-086  mg/dL   Borderline High  >=  240    mg/dL   High        16/05/9603   CHOL  Value: 181        ATP III CLASSIFICATION:  <200     mg/dL   Desirable  540-981  mg/dL   Borderline High  >=191    mg/dL   High        11/15/8293   Lab Results  Component Value Date   HDL 67 08/15/2010   HDL 66 07/25/2009   HDL 94 04/19/2009   Lab Results  Component Value Date   LDLCALC  Value: 41        Total Cholesterol/HDL:CHD Risk Coronary Heart Disease Risk Table                     Men    Women  1/2 Average Risk   3.4   3.3  Average Risk       5.0   4.4  2 X Average Risk   9.6   7.1  3 X Average Risk  23.4   11.0        Use the calculated Patient Ratio above and the CHD Risk Table to determine the patient's CHD Risk.        ATP III CLASSIFICATION (LDL):  <100     mg/dL   Optimal  621-308  mg/dL   Near or Above                    Optimal  130-159  mg/dL   Borderline  657-846  mg/dL   High  >962     mg/dL   Very High 04/15/2840   LDLCALC  Value: 48        Total Cholesterol/HDL:CHD Risk Coronary Heart Disease Risk Table                     Men   Women  1/2 Average Risk   3.4   3.3  Average Risk       5.0   4.4  2 X Average Risk   9.6   7.1  3 X Average Risk  23.4   11.0        Use the calculated Patient Ratio above and the CHD Risk Table to determine the patient's CHD Risk.        ATP III CLASSIFICATION (LDL):  <100     mg/dL   Optimal  324-401  mg/dL   Near or Above                    Optimal  130-159  mg/dL   Borderline  027-253  mg/dL   High  >664     mg/dL   Very High 40/34/7425   LDLCALC  Value: 73        Total Cholesterol/HDL:CHD Risk Coronary Heart Disease Risk Table                     Men   Women  1/2 Average Risk   3.4   3.3  Average Risk       5.0   4.4  2 X Average Risk   9.6   7.1  3 X Average Risk  23.4   11.0        Use the calculated Patient Ratio above and the CHD Risk Table to determine the patient's CHD Risk.        ATP III CLASSIFICATION (  LDL):  <100     mg/dL   Optimal  578-469  mg/dL   Near or Above                    Optimal  130-159  mg/dL   Borderline  629-528  mg/dL   High  >413     mg/dL   Very High 09/14/4008   Lab Results  Component Value Date   TRIG 61 08/15/2010   TRIG 47 07/25/2009   TRIG 69 04/19/2009   Lab Results  Component Value Date   CHOLHDL 1.8 08/15/2010   CHOLHDL 1.9 07/25/2009   CHOLHDL 1.9 04/19/2009   No results found for this basename: LDLDIRECT      Radiology:  No acute abnormality EKG: No acute changes  FOLLOW UP PLANS AND APPOINTMENTS      Medication List         aluminum-magnesium hydroxide-simethicone 200-200-20 MG/5ML Susp  Commonly known as:  MAALOX  Take 30 mLs by mouth 3 (three) times daily as needed. After meals for indigestion     amLODipine 5 MG tablet  Commonly known as:  NORVASC  Take 5 mg by mouth daily.     arginine 500 MG tablet  Take 500 mg by mouth daily.     aspirin 81 MG chewable tablet  Chew 81 mg by mouth daily.     citalopram 20 MG tablet  Commonly known as:  CELEXA  Take 20 mg by mouth daily.     clonazePAM 0.5 MG tablet  Commonly known as:  KLONOPIN  Take 0.5 mg by mouth 2 (two) times daily as needed. For anxiety     cyclobenzaprine 10 MG tablet  Commonly known as:  FLEXERIL  Take 10 mg by mouth 3 (three) times daily as needed. For muscle pain     ferrous sulfate 325 (65 FE) MG tablet  Take 325 mg by mouth daily with breakfast.     gabapentin 300 MG capsule  Commonly known as:  NEURONTIN  Take 300 mg by mouth 3 (three) times daily.     isosorbide mononitrate 120 MG 24 hr tablet  Commonly known as:  IMDUR  Take 120 mg by mouth daily.     nitroGLYCERIN 0.4 MG SL tablet  Commonly known as:  NITROSTAT  Place 0.4 mg under the tongue every 5 (five) minutes as needed. For chest pain     pantoprazole 40 MG tablet  Commonly known as:  PROTONIX  Take 40 mg by mouth daily.     ramipril 2.5 MG capsule  Commonly known as:  ALTACE  Take 1 capsule (2.5 mg total) by mouth daily.     ranitidine 150 MG capsule  Commonly known as:  ZANTAC  Take 150 mg by mouth 2 (two) times daily.     ranolazine 500 MG 12 hr tablet  Commonly known as:  RANEXA  Take 500 mg by mouth 2 (two) times daily.     simvastatin 40 MG tablet  Commonly known as:  ZOCOR  Take 40 mg by mouth every evening.     traMADol-acetaminophen 37.5-325 MG per tablet  Commonly known as:  ULTRACET  Take 1 tablet by mouth every 6 (six) hours as needed. For pain     zolpidem 10 MG tablet  Commonly known as:  AMBIEN  Take  10 mg by mouth at bedtime as needed. For sleep           Follow-up Information   Follow  up with Lesleigh Noe, MD In 3 weeks. (Call to arrange)    Contact information:   [address] [place] [address] [phone]       BRING ALL MEDICATIONS WITH YOU TO FOLLOW UP APPOINTMENTS  Time spent with patient to include physician time:  20 min Signed: Lesleigh Noe 02/23/2013, 8:29 AM

## 2013-03-09 ENCOUNTER — Emergency Department (HOSPITAL_COMMUNITY): Payer: Medicare Other

## 2013-03-09 ENCOUNTER — Observation Stay (HOSPITAL_COMMUNITY)
Admission: EM | Admit: 2013-03-09 | Discharge: 2013-03-10 | DRG: 313 | Disposition: A | Discharge status: Home / Self Care | Payer: Medicare Other | Attending: Internal Medicine | Admitting: Internal Medicine

## 2013-03-09 ENCOUNTER — Encounter (HOSPITAL_COMMUNITY): Payer: Self-pay | Admitting: Physical Medicine and Rehabilitation

## 2013-03-09 DIAGNOSIS — E7800 Pure hypercholesterolemia, unspecified: Secondary | ICD-10-CM | POA: Diagnosis present

## 2013-03-09 DIAGNOSIS — I2510 Atherosclerotic heart disease of native coronary artery without angina pectoris: Secondary | ICD-10-CM | POA: Diagnosis present

## 2013-03-09 DIAGNOSIS — Z7982 Long term (current) use of aspirin: Secondary | ICD-10-CM

## 2013-03-09 DIAGNOSIS — F411 Generalized anxiety disorder: Secondary | ICD-10-CM | POA: Diagnosis present

## 2013-03-09 DIAGNOSIS — R0789 Other chest pain: Principal | ICD-10-CM | POA: Diagnosis present

## 2013-03-09 DIAGNOSIS — E785 Hyperlipidemia, unspecified: Secondary | ICD-10-CM | POA: Diagnosis present

## 2013-03-09 DIAGNOSIS — F329 Major depressive disorder, single episode, unspecified: Secondary | ICD-10-CM

## 2013-03-09 DIAGNOSIS — K224 Dyskinesia of esophagus: Secondary | ICD-10-CM | POA: Diagnosis present

## 2013-03-09 DIAGNOSIS — F3289 Other specified depressive episodes: Secondary | ICD-10-CM | POA: Diagnosis present

## 2013-03-09 DIAGNOSIS — I10 Essential (primary) hypertension: Secondary | ICD-10-CM | POA: Diagnosis present

## 2013-03-09 DIAGNOSIS — Z79899 Other long term (current) drug therapy: Secondary | ICD-10-CM

## 2013-03-09 DIAGNOSIS — K219 Gastro-esophageal reflux disease without esophagitis: Secondary | ICD-10-CM | POA: Diagnosis present

## 2013-03-09 DIAGNOSIS — Z9861 Coronary angioplasty status: Secondary | ICD-10-CM

## 2013-03-09 DIAGNOSIS — M1990 Unspecified osteoarthritis, unspecified site: Secondary | ICD-10-CM | POA: Diagnosis present

## 2013-03-09 DIAGNOSIS — Z87891 Personal history of nicotine dependence: Secondary | ICD-10-CM

## 2013-03-09 DIAGNOSIS — I252 Old myocardial infarction: Secondary | ICD-10-CM

## 2013-03-09 LAB — URINE MICROSCOPIC-ADD ON

## 2013-03-09 LAB — CBC
HCT: 36.9 % (ref 36.0–46.0)
HCT: 37.6 % (ref 36.0–46.0)
Hemoglobin: 12.9 g/dL (ref 12.0–15.0)
Hemoglobin: 12.9 g/dL (ref 12.0–15.0)
MCH: 32.1 pg (ref 26.0–34.0)
MCH: 31.9 pg (ref 26.0–34.0)
MCHC: 35 g/dL (ref 30.0–36.0)
MCHC: 34.3 g/dL (ref 30.0–36.0)
MCV: 91.8 fL (ref 78.0–100.0)
MCV: 92.8 fL (ref 78.0–100.0)
Platelets: 294 10*3/uL (ref 150–400)
Platelets: 302 10*3/uL (ref 150–400)
RBC: 4.02 MIL/uL (ref 3.87–5.11)
RBC: 4.05 MIL/uL (ref 3.87–5.11)
RDW: 13.6 % (ref 11.5–15.5)
RDW: 13.7 % (ref 11.5–15.5)
WBC: 9 10*3/uL (ref 4.0–10.5)
WBC: 7.8 10*3/uL (ref 4.0–10.5)

## 2013-03-09 LAB — POCT I-STAT TROPONIN I
Troponin i, poc: 0.01 ng/mL (ref 0.00–0.08)
Troponin i, poc: 0 ng/mL (ref 0.00–0.08)

## 2013-03-09 LAB — CK TOTAL AND CKMB (NOT AT ARMC)
CK, MB: 1.7 ng/mL (ref 0.3–4.0)
CK, MB: 1.6 ng/mL (ref 0.3–4.0)
Relative Index: INVALID (ref 0.0–2.5)
Relative Index: INVALID (ref 0.0–2.5)
Total CK: 38 U/L (ref 7–177)
Total CK: 33 U/L (ref 7–177)

## 2013-03-09 LAB — BASIC METABOLIC PANEL
BUN: 12 mg/dL (ref 6–23)
CO2: 31 mEq/L (ref 19–32)
Calcium: 9.5 mg/dL (ref 8.4–10.5)
Chloride: 103 mEq/L (ref 96–112)
Creatinine, Ser: 0.79 mg/dL (ref 0.50–1.10)
GFR calc Af Amer: >90 mL/min (ref >90)
GFR calc non Af Amer: 85 mL/min — ABNORMAL LOW (ref >90)
Glucose, Bld: 110 mg/dL — ABNORMAL HIGH (ref 70–99)
Potassium: 4.3 mEq/L (ref 3.5–5.1)
Sodium: 141 mEq/L (ref 135–145)

## 2013-03-09 LAB — URINALYSIS, ROUTINE W REFLEX MICROSCOPIC
Glucose, UA: NEGATIVE mg/dL
Hgb urine dipstick: NEGATIVE
Ketones, ur: 15 mg/dL — AB
Nitrite: NEGATIVE
Protein, ur: NEGATIVE mg/dL
Specific Gravity, Urine: 1.027 (ref 1.005–1.030)
Urobilinogen, UA: 1 mg/dL (ref 0.0–1.0)
pH: 5.5 (ref 5.0–8.0)

## 2013-03-09 LAB — CREATININE, SERUM
Creatinine, Ser: 0.83 mg/dL (ref 0.50–1.10)
GFR calc Af Amer: 83 mL/min — ABNORMAL LOW (ref >90)
GFR calc non Af Amer: 72 mL/min — ABNORMAL LOW (ref >90)

## 2013-03-09 LAB — MRSA PCR SCREENING: MRSA by PCR: NEGATIVE

## 2013-03-09 LAB — D-DIMER, QUANTITATIVE (NOT AT ARMC): D-Dimer, Quant: <0.27 ug/mL-FEU (ref 0.00–0.48)

## 2013-03-09 LAB — TROPONIN I
Troponin I: <0.3 ng/mL (ref <0.30)
Troponin I: <0.3 ng/mL (ref <0.30)

## 2013-03-09 LAB — APTT: aPTT: 31 seconds (ref 24–37)

## 2013-03-09 MED ORDER — GI COCKTAIL ~~LOC~~
30.0000 mL | Freq: Once | ORAL | Status: AC
  Administered 2013-03-09: 30 mL via ORAL
  Filled 2013-03-09: qty 30

## 2013-03-09 MED ORDER — HYDROCODONE-ACETAMINOPHEN 5-325 MG PO TABS
1.0000 | ORAL_TABLET | ORAL | Status: DC | PRN
  Administered 2013-03-09: 1 via ORAL
  Administered 2013-03-10: 2 via ORAL
  Filled 2013-03-09: qty 2
  Filled 2013-03-09: qty 1

## 2013-03-09 MED ORDER — RANOLAZINE ER 500 MG PO TB12
500.0000 mg | ORAL_TABLET | Freq: Two times a day (BID) | ORAL | Status: DC
  Administered 2013-03-09 – 2013-03-10 (×2): 500 mg via ORAL
  Filled 2013-03-09 (×3): qty 1

## 2013-03-09 MED ORDER — ZOLPIDEM TARTRATE 5 MG PO TABS: 10.0000 mg | ORAL_TABLET | Freq: Every evening | ORAL | Status: DC | PRN

## 2013-03-09 MED ORDER — SODIUM CHLORIDE 0.9 % IJ SOLN
3.0000 mL | Freq: Two times a day (BID) | INTRAMUSCULAR | Status: DC
  Administered 2013-03-09: 3 mL via INTRAVENOUS

## 2013-03-09 MED ORDER — HYDROMORPHONE HCL PF 1 MG/ML IJ SOLN: 1.0000 mg | INTRAMUSCULAR | Status: DC | PRN

## 2013-03-09 MED ORDER — ALUM & MAG HYDROXIDE-SIMETH 200-200-20 MG/5ML PO SUSP: 30.0000 mL | Freq: Three times a day (TID) | ORAL | Status: DC | PRN

## 2013-03-09 MED ORDER — RAMIPRIL 2.5 MG PO CAPS
2.5000 mg | ORAL_CAPSULE | Freq: Every day | ORAL | Status: DC
  Administered 2013-03-09 – 2013-03-10 (×2): 2.5 mg via ORAL
  Filled 2013-03-09 (×2): qty 1

## 2013-03-09 MED ORDER — SODIUM CHLORIDE 0.9 % IV SOLN
INTRAVENOUS | Status: DC
  Administered 2013-03-09: 17:00:00 via INTRAVENOUS

## 2013-03-09 MED ORDER — HYDROMORPHONE HCL PF 1 MG/ML IJ SOLN
0.5000 mg | Freq: Once | INTRAMUSCULAR | Status: AC
  Administered 2013-03-09: 0.5 mg via INTRAVENOUS
  Filled 2013-03-09: qty 1

## 2013-03-09 MED ORDER — PANTOPRAZOLE SODIUM 40 MG PO TBEC
40.0000 mg | DELAYED_RELEASE_TABLET | Freq: Every day | ORAL | Status: DC
  Administered 2013-03-09: 40 mg via ORAL
  Filled 2013-03-09: qty 1

## 2013-03-09 MED ORDER — CITALOPRAM HYDROBROMIDE 20 MG PO TABS
20.0000 mg | ORAL_TABLET | Freq: Every day | ORAL | Status: DC
  Administered 2013-03-09 – 2013-03-10 (×2): 20 mg via ORAL
  Filled 2013-03-09 (×2): qty 1

## 2013-03-09 MED ORDER — MORPHINE SULFATE 4 MG/ML IJ SOLN
4.0000 mg | Freq: Once | INTRAMUSCULAR | Status: AC
  Administered 2013-03-09: 4 mg via INTRAVENOUS
  Filled 2013-03-09: qty 1

## 2013-03-09 MED ORDER — HYDROMORPHONE HCL PF 1 MG/ML IJ SOLN: 1.0000 mg | Freq: Once | INTRAMUSCULAR | Status: DC

## 2013-03-09 MED ORDER — FAMOTIDINE 20 MG PO TABS
20.0000 mg | ORAL_TABLET | Freq: Two times a day (BID) | ORAL | Status: DC
  Administered 2013-03-09 – 2013-03-10 (×2): 20 mg via ORAL
  Filled 2013-03-09 (×3): qty 1

## 2013-03-09 MED ORDER — GABAPENTIN 300 MG PO CAPS
300.0000 mg | ORAL_CAPSULE | Freq: Three times a day (TID) | ORAL | Status: DC
  Administered 2013-03-09 – 2013-03-10 (×3): 300 mg via ORAL
  Filled 2013-03-09 (×5): qty 1

## 2013-03-09 MED ORDER — CYCLOBENZAPRINE HCL 10 MG PO TABS
10.0000 mg | ORAL_TABLET | Freq: Three times a day (TID) | ORAL | Status: DC | PRN
  Filled 2013-03-09: qty 1

## 2013-03-09 MED ORDER — ONDANSETRON HCL 4 MG/2ML IJ SOLN: 4.0000 mg | Freq: Four times a day (QID) | INTRAMUSCULAR | Status: DC | PRN

## 2013-03-09 MED ORDER — ONDANSETRON HCL 4 MG PO TABS: 4.0000 mg | ORAL_TABLET | Freq: Four times a day (QID) | ORAL | Status: DC | PRN

## 2013-03-09 MED ORDER — NITROGLYCERIN 2 % TD OINT
1.0000 [in_us] | TOPICAL_OINTMENT | Freq: Once | TRANSDERMAL | Status: AC
  Administered 2013-03-09: 1 [in_us] via TOPICAL
  Filled 2013-03-09: qty 1

## 2013-03-09 MED ORDER — ALUMINUM-MAGNESIUM-SIMETHICONE 200-200-20 MG/5ML PO SUSP: 30.0000 mL | Freq: Three times a day (TID) | ORAL | Status: DC | PRN

## 2013-03-09 MED ORDER — ACETAMINOPHEN 650 MG RE SUPP: 650.0000 mg | Freq: Four times a day (QID) | RECTAL | Status: DC | PRN

## 2013-03-09 MED ORDER — ASPIRIN 81 MG PO CHEW
81.0000 mg | CHEWABLE_TABLET | Freq: Every day | ORAL | Status: DC
  Administered 2013-03-10: 81 mg via ORAL
  Filled 2013-03-09: qty 1

## 2013-03-09 MED ORDER — FAMOTIDINE 20 MG PO TABS
20.0000 mg | ORAL_TABLET | Freq: Once | ORAL | Status: AC
  Administered 2013-03-09: 20 mg via ORAL
  Filled 2013-03-09: qty 1

## 2013-03-09 MED ORDER — CLONAZEPAM 0.5 MG PO TABS: 0.5000 mg | ORAL_TABLET | Freq: Two times a day (BID) | ORAL | Status: DC | PRN

## 2013-03-09 MED ORDER — ZOLPIDEM TARTRATE 5 MG PO TABS
5.0000 mg | ORAL_TABLET | Freq: Every evening | ORAL | Status: DC | PRN
  Administered 2013-03-09: 5 mg via ORAL
  Filled 2013-03-09: qty 1

## 2013-03-09 MED ORDER — HYDROMORPHONE HCL PF 1 MG/ML IJ SOLN
0.5000 mg | INTRAMUSCULAR | Status: AC | PRN
  Administered 2013-03-09: 0.5 mg via INTRAVENOUS
  Filled 2013-03-09: qty 1

## 2013-03-09 MED ORDER — ONDANSETRON HCL 4 MG/2ML IJ SOLN
4.0000 mg | Freq: Once | INTRAMUSCULAR | Status: AC
  Administered 2013-03-09: 4 mg via INTRAVENOUS
  Filled 2013-03-09: qty 2

## 2013-03-09 MED ORDER — ACETAMINOPHEN 325 MG PO TABS: 650.0000 mg | ORAL_TABLET | Freq: Four times a day (QID) | ORAL | Status: DC | PRN

## 2013-03-09 MED ORDER — ENOXAPARIN SODIUM 40 MG/0.4ML ~~LOC~~ SOLN
40.0000 mg | SUBCUTANEOUS | Status: DC
  Administered 2013-03-09: 40 mg via SUBCUTANEOUS
  Filled 2013-03-09 (×2): qty 0.4

## 2013-03-09 MED ORDER — NITROGLYCERIN IN D5W 200-5 MCG/ML-% IV SOLN
5.0000 ug/min | INTRAVENOUS | Status: DC
  Administered 2013-03-09: 5 ug/min via INTRAVENOUS
  Filled 2013-03-09: qty 250

## 2013-03-09 MED ORDER — ATORVASTATIN CALCIUM 20 MG PO TABS
20.0000 mg | ORAL_TABLET | Freq: Every day | ORAL | Status: DC
  Administered 2013-03-09: 20 mg via ORAL
  Filled 2013-03-09 (×2): qty 1

## 2013-03-09 MED ORDER — SODIUM CHLORIDE 0.9 % IV SOLN
Freq: Once | INTRAVENOUS | Status: AC
  Administered 2013-03-09: 12:00:00 via INTRAVENOUS

## 2013-03-09 MED ORDER — SIMVASTATIN 40 MG PO TABS: 40.0000 mg | ORAL_TABLET | Freq: Every evening | ORAL | Status: DC

## 2013-03-09 NOTE — ED Provider Notes (Signed)
CSN: [id number]     Arrival date & time 03/09/13  1053 History     First MD Initiated Contact with Patient 03/09/13 1059     Chief Complaint  Patient presents with  . Chest Pain  . Shortness of Breath   (Consider location/radiation/quality/duration/timing/severity/associated sxs/prior Treatment) HPI Comments: Patient is a 67 year old African American female with a history of hypertension, hypercholesterolemia, coronary artery disease requiring coronary stents, and gastroesophageal reflux disease who presents to the emergency department with a complaint of a 5/10 pain in the anterior chest that started at approximately 5:30 AM  on July 29. Patient was admitted to the hospital and had a cardiac catheterization on July 16 at which time the patient was noted to have elevation in troponin. The patient underwent cardiac catheterization and the findings with mostly unchanged from 2008. There was some spasm present but no other changes. Pt c/o pain at 30 min intervals that will not go away with conservative management.  No LOC. No vomiting. She does c/o some sweats. Pain not improved by ASA, NTG or morphine by EMS this AM>  The history is provided by the patient.    Past Medical History  Diagnosis Date  . Hypertension   . Hypercholesteremia   . GERD (gastroesophageal reflux disease)   . Anxiety   . Hiatal hernia   . Anemia   . Blood transfusion   . Coronary artery disease    Past Surgical History  Procedure Laterality Date  . Cardiac stent    . Shoulder surgery    . Breast      right, tumor   . Knee surgery  left   No family history on file. History  Substance Use Topics  . Smoking status: Former Smoker -- 1.00 packs/day for 40 years    Types: Cigarettes    Quit date: 10/10/2011  . Smokeless tobacco: Former Neurosurgeon  . Alcohol Use: 0.6 oz/week    1 Cans of beer per week     Comment: occasional   OB History   Grav Para Term Preterm Abortions TAB SAB Ect Mult Living                  Review of Systems  Constitutional: Negative for activity change.       All ROS Neg except as noted in HPI  HENT: Negative for nosebleeds and neck pain.   Eyes: Negative for photophobia and discharge.  Respiratory: Positive for shortness of breath. Negative for cough and wheezing.   Cardiovascular: Positive for chest pain. Negative for palpitations and leg swelling.  Gastrointestinal: Negative for abdominal pain and blood in stool.  Genitourinary: Negative for dysuria, frequency and hematuria.  Musculoskeletal: Negative for back pain and arthralgias.  Skin: Negative.   Neurological: Negative for dizziness, seizures and speech difficulty.  Psychiatric/Behavioral: Negative for hallucinations and confusion. The patient is nervous/anxious.     Allergies  Review of patient's allergies indicates no known allergies.  Home Medications   Current Outpatient Rx  Name  Route  Sig  Dispense  Refill  . aluminum-magnesium hydroxide-simethicone (MAALOX) 200-200-20 MG/5ML SUSP   Oral   Take 30 mLs by mouth 3 (three) times daily as needed. After meals for indigestion         . amLODipine (NORVASC) 5 MG tablet   Oral   Take 5 mg by mouth daily.         Marland Kitchen arginine 500 MG tablet   Oral   Take 500 mg by mouth daily.         Marland Kitchen  aspirin 81 MG chewable tablet   Oral   Chew 81 mg by mouth daily.         . citalopram (CELEXA) 20 MG tablet   Oral   Take 20 mg by mouth daily.         . clonazePAM (KLONOPIN) 0.5 MG tablet   Oral   Take 0.5 mg by mouth 2 (two) times daily as needed. For anxiety         . cyclobenzaprine (FLEXERIL) 10 MG tablet   Oral   Take 10 mg by mouth 3 (three) times daily as needed. For muscle pain         . ferrous sulfate 325 (65 FE) MG tablet   Oral   Take 325 mg by mouth daily with breakfast.         . gabapentin (NEURONTIN) 300 MG capsule   Oral   Take 300 mg by mouth 3 (three) times daily.         . isosorbide mononitrate (IMDUR) 120 MG 24 hr  tablet   Oral   Take 120 mg by mouth daily.         . nitroGLYCERIN (NITROSTAT) 0.4 MG SL tablet   Sublingual   Place 0.4 mg under the tongue every 5 (five) minutes as needed. For chest pain         . pantoprazole (PROTONIX) 40 MG tablet   Oral   Take 40 mg by mouth daily.         . ramipril (ALTACE) 2.5 MG capsule   Oral   Take 1 capsule (2.5 mg total) by mouth daily.           Resume on 12/22/11   . ranitidine (ZANTAC) 150 MG capsule   Oral   Take 150 mg by mouth 2 (two) times daily.         . ranolazine (RANEXA) 500 MG 12 hr tablet   Oral   Take 500 mg by mouth 2 (two) times daily.         . simvastatin (ZOCOR) 40 MG tablet   Oral   Take 40 mg by mouth every evening.         . traMADol-acetaminophen (ULTRACET) 37.5-325 MG per tablet   Oral   Take 1 tablet by mouth every 6 (six) hours as needed. For pain         . zolpidem (AMBIEN) 10 MG tablet   Oral   Take 10 mg by mouth at bedtime as needed. For sleep          BP 110/71  Pulse 53  Temp(Src) 98.2 F (36.8 C) (Oral)  Resp 18  SpO2 100% Physical Exam  Nursing note and vitals reviewed. Constitutional: She is oriented to person, place, and time. She appears well-developed and well-nourished.  Non-toxic appearance.  HENT:  Head: Normocephalic.  Right Ear: Tympanic membrane and external ear normal.  Left Ear: Tympanic membrane and external ear normal.  Eyes: EOM and lids are normal. Pupils are equal, round, and reactive to light.  Neck: Normal range of motion. Neck supple. Carotid bruit is not present.  Cardiovascular: Normal rate, regular rhythm, normal heart sounds, intact distal pulses and normal pulses.   Pulmonary/Chest: Breath sounds normal. No respiratory distress. She has no wheezes. She has no rales.  Mils left anterior chest tenderness.  Abdominal: Soft. Bowel sounds are normal. She exhibits no mass. There is no tenderness. There is no guarding.  Musculoskeletal: Normal range of  motion.  Lymphadenopathy:       Head (right side): No submandibular adenopathy present.       Head (left side): No submandibular adenopathy present.    She has no cervical adenopathy.  Neurological: She is alert and oriented to person, place, and time. She has normal strength. No cranial nerve deficit or sensory deficit.  Skin: Skin is warm and dry.  Psychiatric: She has a normal mood and affect. Her speech is normal.    ED Course   Procedures (including critical care time)  Labs Reviewed  CBC  APTT  URINALYSIS, ROUTINE W REFLEX MICROSCOPIC  BASIC METABOLIC PANEL   No results found. No diagnosis found.  Date: 03/09/2013  Rate53  Rhythm: normal sinus rhythm  QRS Axis: normal  Intervals: normal  ST/T Wave abnormalities: low voltage- precordial leads  Conduction Disutrbances:none  Narrative Interpretation: No STEMI. Unchanged from February 23, 2013  Old EKG Reviewed: unchanged  MDM  **I have reviewed nursing notes, vital signs, and all appropriate lab and imaging results for this patient.* The patient was given 324 mg of aspirin, 3 sublingual nitroglycerin, and 4 mg of intravenous morphine by EMS without resolution of the pain and discomfort. The patient has received nitroglycerin ointment, IV morphine, and GI cocktail here in the emergency department and continues to not have any resolution of the chest discomfort. There no acute changes on the monitor. The troponin is 0.01 today. The complete blood count is well within normal limits. The basic metabolic panel is well within normal limits with exception of the glucose being slightly elevated at 110. Call has been placed to [facility] cardiology to discuss the case. Case discussed with Dr Eldridge Dace. Pt to have 2nd troponin. Pt given IV dilaudid. Pt states she did not get any relief from IV dilaudid. 2nd Troponin Neg. [facility] called. Dr Isidoro Donning will admit pt.        Kathie Dike, PA-C 03/09/13 1600

## 2013-03-09 NOTE — Consult Note (Signed)
Admit date: 03/09/2013 Referring Physician  Dr. 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  SHe was hospitalized with a troponin elevation and had a cath showing an unchanged diagonal stenosis.  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    She is currently having mild chest discomfort now that is not relieved with NTG, morphine or dilaudid. It is different than her GERD. We are asked to consult.     PMH:   Past Medical History  Diagnosis Date  . Hypertension   . Hypercholesteremia   . GERD (gastroesophageal reflux disease)   . Anxiety   . Hiatal hernia   . Anemia   . Blood transfusion   . Coronary artery disease      PSH:   Past Surgical History  Procedure Laterality Date  . Cardiac stent    . Shoulder surgery    . Breast      right, tumor   . Knee surgery  left    Allergies:  Review of patient's allergies indicates no known allergies. Prior to Admit Meds:   Prescriptions prior to admission  Medication Sig Dispense Refill  . aluminum-magnesium hydroxide-simethicone (MAALOX) 200-200-20 MG/5ML SUSP Take 30 mLs by mouth 3 (three) times daily as needed. After meals for indigestion      . amLODipine (NORVASC) 5 MG tablet Take 5 mg by mouth daily.      Marland Kitchen arginine 500 MG tablet Take 500 mg by mouth daily.      Marland Kitchen aspirin 81 MG chewable tablet Chew 81 mg by mouth daily.      . citalopram (CELEXA) 20 MG tablet Take 20 mg by mouth daily.      . clonazePAM (KLONOPIN) 0.5 MG tablet Take 0.5 mg by mouth 2 (two) times daily as needed. For anxiety      . cyclobenzaprine (FLEXERIL) 10 MG tablet Take 10 mg by mouth 3 (three) times daily as needed. For muscle pain      . ferrous sulfate 325 (65 FE) MG tablet Take 325 mg by mouth daily with breakfast.      . gabapentin (NEURONTIN) 300 MG capsule Take 300 mg by mouth 3 (three)  times daily.      . isosorbide mononitrate (IMDUR) 120 MG 24 hr tablet Take 120 mg by mouth daily.      . nitroGLYCERIN (NITROSTAT) 0.4 MG SL tablet Place 0.4 mg under the tongue every 5 (five) minutes as needed. For chest pain      . pantoprazole (PROTONIX) 40 MG tablet Take 40 mg by mouth daily.      . ramipril (ALTACE) 2.5 MG capsule Take 1 capsule (2.5 mg total) by mouth daily.      . ranitidine (ZANTAC) 150 MG capsule Take 150 mg by mouth 2 (two) times daily.      . ranolazine (RANEXA) 500 MG 12 hr tablet Take 500 mg by mouth 2 (two) times daily.      . simvastatin (ZOCOR) 40 MG tablet Take 40 mg by mouth every evening.      . traMADol-acetaminophen (ULTRACET) 37.5-325 MG per tablet Take 1 tablet by mouth every 6 (six) hours as needed. For pain      . zolpidem (AMBIEN) 10 MG tablet Take 10 mg by mouth at bedtime as needed. For sleep  Fam HX:   No family history on file. Social HX:    History   Social History  . Marital Status: Married    Spouse Name: N/A    Number of Children: N/A  . Years of Education: N/A   Occupational History  . Not on file.   Social History Main Topics  . Smoking status: Former Smoker -- 1.00 packs/day for 40 years    Types: Cigarettes    Quit date: 10/10/2011  . Smokeless tobacco: Former Neurosurgeon  . Alcohol Use: 0.6 oz/week    1 Cans of beer per week     Comment: occasional  . Drug Use: No  . Sexually Active: No   Other Topics Concern  . Not on file   Social History Narrative  . No narrative on file     ROS:  All 11 ROS were addressed and are negative except what is stated in the HPI  Physical Exam: Blood pressure 100/78, pulse 54, temperature 97.6 F (36.4 C), temperature source Oral, resp. rate 13, height 5' (1.524 m), weight 56.6 kg (124 lb 12.5 oz), SpO2 100.00%.   General: Well developed, well nourished, in no acute distress Head: Normal cephalic and atramatic  Lungs:  Clear bilaterally to auscultation and percussion. Heart:  HRRR  S1 S2 Pulses are 2+ & equal.            No carotid bruit. No JVD.  Abdomen:  abdomen soft and non-tender Msk:  Back normal, normal gait. Normal strength and tone for age. Extremities:  No edema.  DP +1 Neuro: Alert and oriented X 3. Psych:  Good affect, responds appropriately    Labs:   Lab Results  Component Value Date   WBC 9.0 03/09/2013   HGB 12.9 03/09/2013   HCT 36.9 03/09/2013   MCV 91.8 03/09/2013   PLT 294 03/09/2013    Recent Labs Lab 03/09/13 1145 03/09/13 1736  NA 141  --   K 4.3  --   CL 103  --   CO2 31  --   BUN 12  --   CREATININE 0.79 0.83  CALCIUM 9.5  --   GLUCOSE 110*  --    No results found for this basename: PTT   Lab Results  Component Value Date   INR 0.92 08/14/2010   INR 0.96 01/22/2010   INR 0.95 07/24/2009   Lab Results  Component Value Date   CKTOTAL 38 03/09/2013   CKMB 1.7 03/09/2013   TROPONINI <0.30 03/09/2013     Lab Results  Component Value Date   CHOL  Value: 120        ATP III CLASSIFICATION:  <200     mg/dL   Desirable  409-811  mg/dL   Borderline High  >=914    mg/dL   High        02/16/2955   CHOL  Value: 123        ATP III CLASSIFICATION:  <200     mg/dL   Desirable  213-086  mg/dL   Borderline High  >=578    mg/dL   High        46/96/2952   CHOL  Value: 181        ATP III CLASSIFICATION:  <200     mg/dL   Desirable  841-324  mg/dL   Borderline High  >=401    mg/dL   High        0/09/7251   Lab Results  Component Value  Date   HDL 67 08/15/2010   HDL 66 07/25/2009   HDL 94 04/19/2009   Lab Results  Component Value Date   LDLCALC  Value: 41        Total Cholesterol/HDL:CHD Risk Coronary Heart Disease Risk Table                     Men   Women  1/2 Average Risk   3.4   3.3  Average Risk       5.0   4.4  2 X Average Risk   9.6   7.1  3 X Average Risk  23.4   11.0        Use the calculated Patient Ratio above and the CHD Risk Table to determine the patient's CHD Risk.        ATP III CLASSIFICATION (LDL):  <100     mg/dL   Optimal   161-096  mg/dL   Near or Above                    Optimal  130-159  mg/dL   Borderline  045-409  mg/dL   High  >811     mg/dL   Very High 04/11/4781   LDLCALC  Value: 48        Total Cholesterol/HDL:CHD Risk Coronary Heart Disease Risk Table                     Men   Women  1/2 Average Risk   3.4   3.3  Average Risk       5.0   4.4  2 X Average Risk   9.6   7.1  3 X Average Risk  23.4   11.0        Use the calculated Patient Ratio above and the CHD Risk Table to determine the patient's CHD Risk.        ATP III CLASSIFICATION (LDL):  <100     mg/dL   Optimal  956-213  mg/dL   Near or Above                    Optimal  130-159  mg/dL   Borderline  086-578  mg/dL   High  >469     mg/dL   Very High 62/95/2841   LDLCALC  Value: 73        Total Cholesterol/HDL:CHD Risk Coronary Heart Disease Risk Table                     Men   Women  1/2 Average Risk   3.4   3.3  Average Risk       5.0   4.4  2 X Average Risk   9.6   7.1  3 X Average Risk  23.4   11.0        Use the calculated Patient Ratio above and the CHD Risk Table to determine the patient's CHD Risk.        ATP III CLASSIFICATION (LDL):  <100     mg/dL   Optimal  324-401  mg/dL   Near or Above                    Optimal  130-159  mg/dL   Borderline  027-253  mg/dL   High  >664     mg/dL   Very High 4/0/3474   Lab Results  Component Value Date  TRIG 61 08/15/2010   TRIG 47 07/25/2009   TRIG 69 04/19/2009   Lab Results  Component Value Date   CHOLHDL 1.8 08/15/2010   CHOLHDL 1.9 07/25/2009   CHOLHDL 1.9 04/19/2009   No results found for this basename: LDLDIRECT      Radiology:  Dg Chest Portable 1 View  03/09/2013   *RADIOLOGY REPORT*  Clinical Data: Left chest pain, shortness of breath  PORTABLE CHEST - 1 VIEW  Comparison: 02/21/2013  Findings: Lungs are clear. No pleural effusion or pneumothorax.  The heart is normal in size.  Cervical spine fixation hardware.  IMPRESSION: No evidence of acute cardiopulmonary disease.   Original Report Authenticated By:  Charline Bills, M.D.    EKG:  NSR, no ST segment changes  ASSESSMENT: Atypical chest pain  PLAN:  She had a cath 2 weeks ago.  WOuld not repeat cath unless she had markedly positive enzymes or ECG changes.  Agree with w/u for noncardiac causes of CP.  Echo done as well at last visit to hospital.    Will follow.  Corky Crafts., MD  03/09/2013  8:42 PM

## 2013-03-09 NOTE — ED Provider Notes (Signed)
Medical screening examination/treatment/procedure(s) were conducted as a shared visit with non-physician practitioner(s) or resident  and myself.  I personally evaluated the patient during the encounter and agree with the findings and plan unless otherwise indicated.  Recent cath with CAD but no acute lesions.  Difficulty with pain control in ED and with known CAD plan for observation and follow cardiac enzymes.  Mild improvement on recheck.  Cardiology and hospitalist contacted by PA.  No acute findings in ED.  Enid Skeens, MD 03/09/13 [phone]

## 2013-03-09 NOTE — H&P (Signed)
History and Physical       Hospital Admission Note Date: 03/09/2013  Patient name: [facility] Medical record number: [id number] Date of birth: 01/10/1946 Age: 67 y.o. Gender: female PCP: Georgann Housekeeper, MD  Primary cardiologist, Dr. Verdis Prime  Chief Complaint:  Chest pain since yesterday  HPI: Patient is a 67 year old African American female with history of hypertension, hyperlipidemia, GERD, who recently was discharged from cardiology service and she had presented with prolonged severe chest pain and similar presentation. She had cardiac catheterization done with angiography which showed generalized left coronary vasoconstriction that was markedly improved with intracoronary nitroglycerin. After nitroglycerin administration no focal stenoses of greater than 70% was noted. She did have a focal 40-50% proximal LAD a 70% ostial diagonal 1.  Patient reported that she started having similar severe chest pain episodes since yesterday morning, each episode lasting about 30 minutes. She has a history of GERD but does not feel this chest discomfort is GERD. Patient reported taking nitroglycerin sublingual which initially did help with the chest pain somewhat but the chest pain would come back. She also reports chest pain radiating to the back and some numbness in her fingers. She has a history of GERD and has noticed sometimes food sticking but no gross dysphagia or odynophagia.    Review of Systems:  Constitutional: Denies fever, chills, diaphoresis, poor appetite and fatigue.  HEENT: Denies photophobia, eye pain, redness, hearing loss, ear pain, congestion, sore throat, rhinorrhea, sneezing, mouth sores, trouble swallowing, neck pain, neck stiffness and tinnitus.   Respiratory: Denies SOB, DOE, cough,  wheezing.   Cardiovascular: See history of present illness  Gastrointestinal: Please see history of present illness  Genitourinary: Denies  dysuria, urgency, frequency, hematuria, flank pain and difficulty urinating.  Musculoskeletal: Denies myalgias, back pain, joint swelling, arthralgias and gait problem.  Skin: Denies pallor, rash and wound.  Neurological: Denies dizziness, seizures, syncope, weakness, light-headedness, numbness and headaches.  Hematological: Denies adenopathy. Easy bruising, personal or family bleeding history  Psychiatric/Behavioral: Denies suicidal ideation, mood changes, confusion, nervousness, sleep disturbance and agitation  Past Medical History: Past Medical History  Diagnosis Date  . Hypertension   . Hypercholesteremia   . GERD (gastroesophageal reflux disease)   . Anxiety   . Hiatal hernia   . Anemia   . Blood transfusion   . Coronary artery disease    Past Surgical History  Procedure Laterality Date  . Cardiac stent    . Shoulder surgery    . Breast      right, tumor   . Knee surgery  left    Medications: Prior to Admission medications   Medication Sig Start Date End Date Taking? Authorizing Provider  aluminum-magnesium hydroxide-simethicone (MAALOX) 200-200-20 MG/5ML SUSP Take 30 mLs by mouth 3 (three) times daily as needed. After meals for indigestion   Yes Historical Provider, MD  amLODipine (NORVASC) 5 MG tablet Take 5 mg by mouth daily.   Yes Historical Provider, MD  arginine 500 MG tablet Take 500 mg by mouth daily.   Yes Historical Provider, MD  aspirin 81 MG chewable tablet Chew 81 mg by mouth daily.   Yes Historical Provider, MD  citalopram (CELEXA) 20 MG tablet Take 20 mg by mouth daily.   Yes Historical Provider, MD  clonazePAM (KLONOPIN) 0.5 MG tablet Take 0.5 mg by mouth 2 (two) times daily as needed. For anxiety   Yes Historical Provider, MD  cyclobenzaprine (FLEXERIL) 10 MG tablet Take 10 mg by mouth 3 (three) times daily as needed. For  muscle pain   Yes Historical Provider, MD  ferrous sulfate 325 (65 FE) MG tablet Take 325 mg by mouth daily with breakfast.   Yes  Historical Provider, MD  gabapentin (NEURONTIN) 300 MG capsule Take 300 mg by mouth 3 (three) times daily.   Yes Historical Provider, MD  isosorbide mononitrate (IMDUR) 120 MG 24 hr tablet Take 120 mg by mouth daily.   Yes Historical Provider, MD  nitroGLYCERIN (NITROSTAT) 0.4 MG SL tablet Place 0.4 mg under the tongue every 5 (five) minutes as needed. For chest pain   Yes Historical Provider, MD  pantoprazole (PROTONIX) 40 MG tablet Take 40 mg by mouth daily.   Yes Historical Provider, MD  ramipril (ALTACE) 2.5 MG capsule Take 1 capsule (2.5 mg total) by mouth daily. 12/09/11  Yes Sorin Luanne Bras, MD  ranitidine (ZANTAC) 150 MG capsule Take 150 mg by mouth 2 (two) times daily.   Yes Historical Provider, MD  ranolazine (RANEXA) 500 MG 12 hr tablet Take 500 mg by mouth 2 (two) times daily.   Yes Historical Provider, MD  simvastatin (ZOCOR) 40 MG tablet Take 40 mg by mouth every evening.   Yes Historical Provider, MD  traMADol-acetaminophen (ULTRACET) 37.5-325 MG per tablet Take 1 tablet by mouth every 6 (six) hours as needed. For pain   Yes Historical Provider, MD  zolpidem (AMBIEN) 10 MG tablet Take 10 mg by mouth at bedtime as needed. For sleep   Yes Historical Provider, MD    Allergies:  No Known Allergies  Social History:  reports that she quit smoking about 16 months ago. Her smoking use included Cigarettes. She has a 40 pack-year smoking history. She has quit using smokeless tobacco. She reports that she drinks about 0.6 ounces of alcohol per week. She reports that she does not use illicit drugs.  Family History: No family history on file.  Physical Exam: Blood pressure 118/64, pulse 51, temperature 98.2 F (36.8 C), temperature source Oral, resp. rate 14, SpO2 100.00%. General: Alert, awake, oriented x3, in no acute distress. HEENT: normocephalic, atraumatic, anicteric sclera, pink conjunctiva, pupils equal and reactive to light and accomodation, oropharynx clear Neck: supple, no masses  or lymphadenopathy, no goiter, no bruits  Heart: Regular rate and rhythm, without murmurs, rubs or gallops. Lungs: Clear to auscultation bilaterally, no wheezing, rales or rhonchi. No reproducible chest wall tenderness Abdomen: Soft, nontender, nondistended, positive bowel sounds, no masses. Extremities: No clubbing, cyanosis or edema with positive pedal pulses. Neuro: Grossly intact, no focal neurological deficits, strength 5/5 upper and lower extremities bilaterally Psych: alert and oriented x 3, normal mood and affect Skin: no rashes or lesions, warm and dry   LABS on Admission:  Basic Metabolic Panel:  Recent Labs Lab 03/09/13 1145  NA 141  K 4.3  CL 103  CO2 31  GLUCOSE 110*  BUN 12  CREATININE 0.79  CALCIUM 9.5   Liver Function Tests: No results found for this basename: AST, ALT, ALKPHOS, BILITOT, PROT, ALBUMIN,  in the last 168 hours No results found for this basename: LIPASE, AMYLASE,  in the last 168 hours No results found for this basename: AMMONIA,  in the last 168 hours CBC:  Recent Labs Lab 03/09/13 1145  WBC 9.0  HGB 12.9  HCT 36.9  MCV 91.8  PLT 294   Cardiac Enzymes: No results found for this basename: CKTOTAL, CKMB, CKMBINDEX, TROPONINI,  in the last 168 hours BNP: No components found with this basename: POCBNP,  CBG: No results found  for this basename: GLUCAP,  in the last 168 hours   Radiological Exams on Admission: Dg Chest 2 View  02/21/2013   *RADIOLOGY REPORT*  Clinical Data: Chest pain, hypertension.  CHEST - 2 VIEW  Comparison: 12/08/2011  Findings: Cervical fixation hardware noted.  Heart size normal. Lungs clear.  No effusion.  IMPRESSION:  1.  No acute disease   Original Report Authenticated By: D. Andria Rhein, MD   Dg Chest Portable 1 View  03/09/2013   *RADIOLOGY REPORT*  Clinical Data: Left chest pain, shortness of breath  PORTABLE CHEST - 1 VIEW  Comparison: 02/21/2013  Findings: Lungs are clear. No pleural effusion or pneumothorax.   The heart is normal in size.  Cervical spine fixation hardware.  IMPRESSION: No evidence of acute cardiopulmonary disease.   Original Report Authenticated By: Charline Bills, M.D.    Assessment/Plan Principal Problem:   Atypical chest pain: Unclear if it is cardiac however patient had vasoconstriction noticed on cardiac cath that improved with intracoronary nitroglycerin.  Patient is still having chest pains, 5/10. -Will admit to step down, start patient on nitroglycerin drip, obtain serial cardiac enzymes, d-dimer. If d-dimer is positive, will rule out any aortic dissection or PE. - Discussed with cardiology, Dr. Eldridge Dace, continue aspirin, Statin, Ranexa, PPI, ranitidine.  Active Problems:   HTN (hypertension) - Currently stable, continue nitroglycerin drip, altase    GERD (gastroesophageal reflux disease) -Patient is on PPI and ranitidine, will continue, no significant improvement with GI cocktail. - May need GI consult for endoscopy or GI causes if cardiology consult rules out cardiogenic cause of the chest pain.      Dyslipidemia -Continue statins   Anxiety : Continue Celexa, Klonopin    DVT prophylaxis:  Lovenox   CODE STATUS: full code   Further plan will depend as patient's clinical course evolves and further radiologic and laboratory data become available.   Time Spent on Admission: 1 hour  Chella Chapdelaine M.D. [facility] 03/09/2013, 4:53 PM Pager: [phone]  If 7PM-7AM, please contact night-coverage [email or web address] Password [email or web address]

## 2013-03-09 NOTE — ED Notes (Signed)
Pt presents to department from home via [facility] for evaluation of midsternal chest pain radiating to back. Onset Tuesday night. Also states shortness of breath. 5/10 pain upon arrival. Received (4) sublingual nitroglycerin, 324 ASA and 4mg  morphine per EMS, no relief. Pt is conscious alert and oriented x4. 20g LAC.

## 2013-03-10 LAB — BASIC METABOLIC PANEL
BUN: 19 mg/dL (ref 6–23)
CO2: 26 mEq/L (ref 19–32)
Calcium: 9 mg/dL (ref 8.4–10.5)
Chloride: 105 mEq/L (ref 96–112)
Creatinine, Ser: 0.86 mg/dL (ref 0.50–1.10)
GFR calc Af Amer: 80 mL/min — ABNORMAL LOW (ref >90)
GFR calc non Af Amer: 69 mL/min — ABNORMAL LOW (ref >90)
Glucose, Bld: 139 mg/dL — ABNORMAL HIGH (ref 70–99)
Potassium: 4.6 mEq/L (ref 3.5–5.1)
Sodium: 138 mEq/L (ref 135–145)

## 2013-03-10 LAB — URINE CULTURE
Colony Count: NO GROWTH
Culture: NO GROWTH

## 2013-03-10 LAB — CBC
HCT: 36.2 % (ref 36.0–46.0)
Hemoglobin: 12.5 g/dL (ref 12.0–15.0)
MCH: 32.3 pg (ref 26.0–34.0)
MCHC: 34.5 g/dL (ref 30.0–36.0)
MCV: 93.5 fL (ref 78.0–100.0)
Platelets: 252 10*3/uL (ref 150–400)
RBC: 3.87 MIL/uL (ref 3.87–5.11)
RDW: 13.9 % (ref 11.5–15.5)
WBC: 7.1 10*3/uL (ref 4.0–10.5)

## 2013-03-10 LAB — TROPONIN I: Troponin I: <0.3 ng/mL (ref <0.30)

## 2013-03-10 LAB — CK TOTAL AND CKMB (NOT AT ARMC)
CK, MB: 2.6 ng/mL (ref 0.3–4.0)
Relative Index: INVALID (ref 0.0–2.5)
Total CK: 52 U/L (ref 7–177)

## 2013-03-10 MED ORDER — SODIUM CHLORIDE 0.9 % IV SOLN
INTRAVENOUS | Status: DC
  Administered 2013-03-10 (×2): via INTRAVENOUS

## 2013-03-10 MED ORDER — PREDNISONE 20 MG PO TABS: ORAL_TABLET | ORAL | Status: DC

## 2013-03-10 NOTE — Progress Notes (Signed)
Paged [facility] about patient's BP steadily being low even after stopping the nitro last night. Patient asymptomatic at this time. Orders to increase fluids given.

## 2013-03-10 NOTE — Discharge Summary (Signed)
Physician Discharge Summary  Patient ID: [name] MRN: [id number] DOB/AGE: Dec 02, 1945 67 y.o.  Admit date: 03/09/2013 Discharge date: 03/10/2013  Admission Diagnoses:  Discharge Diagnoses:  Principal Problem:   Atypical chest pain Esophageal dysmotility/spasm Chest wall tenderness Active Problems:   HTN (hypertension) History of CAD Osteoarthritis Maj. depression   GERD (gastroesophageal reflux disease)   Dyslipidemia   Discharged Condition: good  Hospital Course: 67 years old female with history of coronary vasospasms and mild CAD had recently cardiac catheterization after elevated troponin about 2 or 3 weeks ago presented with an episode of left-sided chest pain. Problem #1 chest pain: patient had normal EKG chest x-ray and blood work cardiac markers was negative d-dimer was normal. Atypical symptoms she was put on monitor as also as started on nitroglycerin drip. She still complained of soreness in the chest especially tenderness on palpation of the chest wall. She also had history of esophageal dysmotility and some vasospasms. Thought to be noncardiac. Cardiology was consulted. Agreed with no more further workup at this point with recent cardiac catheterization in echocardiogram. Continue on her current medications including long-acting nitrates as well as Ranexa. GERD: continue on PPI and Zantac Chest wall soreness possible costochondritis prednisone for 5 days  Consults: cardiology  Significant Diagnostic Studies: labs: blood work including blood chemistries blood counts are Markers d-dimer urinalysis negative and radiology: X-Ray: chest x-ray negative  Treatments: cardiac meds: nitroglycerin  Discharge Exam: Blood pressure 118/52, pulse 51, temperature 97.8 F (36.6 C), temperature source Oral, resp. rate 14, height 5' (1.524 m), weight 56.6 kg (124 lb 12.5 oz), SpO2 99.00%. General appearance: alert Resp: clear to auscultation bilaterally Chest wall: no  tenderness, left sided chest wall tenderness Cardio: regular rate and rhythm GI: soft, non-tender; bowel sounds normal; no masses,  no organomegaly  Disposition: 01-Home or Self Care  Discharge Orders   Future Orders Complete By Expires     Diet - low sodium heart healthy  As directed     Increase activity slowly  As directed         Medication List         aluminum-magnesium hydroxide-simethicone 200-200-20 MG/5ML Susp  Commonly known as:  MAALOX  Take 30 mLs by mouth 3 (three) times daily as needed. After meals for indigestion     amLODipine 5 MG tablet  Commonly known as:  NORVASC  Take 5 mg by mouth daily.     arginine 500 MG tablet  Take 500 mg by mouth daily.     aspirin 81 MG chewable tablet  Chew 81 mg by mouth daily.     citalopram 20 MG tablet  Commonly known as:  CELEXA  Take 20 mg by mouth daily.     clonazePAM 0.5 MG tablet  Commonly known as:  KLONOPIN  Take 0.5 mg by mouth 2 (two) times daily as needed. For anxiety     cyclobenzaprine 10 MG tablet  Commonly known as:  FLEXERIL  Take 10 mg by mouth 3 (three) times daily as needed. For muscle pain     ferrous sulfate 325 (65 FE) MG tablet  Take 325 mg by mouth daily with breakfast.     gabapentin 300 MG capsule  Commonly known as:  NEURONTIN  Take 300 mg by mouth 3 (three) times daily.     isosorbide mononitrate 120 MG 24 hr tablet  Commonly known as:  IMDUR  Take 120 mg by mouth daily.     nitroGLYCERIN 0.4 MG SL tablet  Commonly known as:  NITROSTAT  Place 0.4 mg under the tongue every 5 (five) minutes as needed. For chest pain     pantoprazole 40 MG tablet  Commonly known as:  PROTONIX  Take 40 mg by mouth daily.     predniSONE 20 MG tablet  Commonly known as:  DELTASONE  1 po bid for 5 days     ramipril 2.5 MG capsule  Commonly known as:  ALTACE  Take 1 capsule (2.5 mg total) by mouth daily.     ranitidine 150 MG capsule  Commonly known as:  ZANTAC  Take 150 mg by mouth 2 (two)  times daily.     ranolazine 500 MG 12 hr tablet  Commonly known as:  RANEXA  Take 500 mg by mouth 2 (two) times daily.     simvastatin 40 MG tablet  Commonly known as:  ZOCOR  Take 40 mg by mouth every evening.     traMADol-acetaminophen 37.5-325 MG per tablet  Commonly known as:  ULTRACET  Take 1 tablet by mouth every 6 (six) hours as needed. For pain     zolpidem 10 MG tablet  Commonly known as:  AMBIEN  Take 10 mg by mouth at bedtime as needed. For sleep         Signed: Machel Violante 03/10/2013, 10:45 AM

## 2013-03-10 NOTE — Progress Notes (Signed)
Subjective: C/o soreness on left chest are- sore to touch on chest wall  No abd pain, no SOB Lab negative. EKG -ok   Objective: Vital signs in last 24 hours: Temp:  [97.6 F (36.4 C)-98.4 F (36.9 C)] 98.3 F (36.8 C) (07/31 0345) Pulse Rate:  [45-61] 57 (07/31 0700) Resp:  [8-20] 14 (07/31 0700) BP: (63-125)/(25-78) 117/57 mmHg (07/31 0700) SpO2:  [96 %-100 %] 100 % (07/31 0700) Weight:  [56.6 kg (124 lb 12.5 oz)] 56.6 kg (124 lb 12.5 oz) (07/30 1715) Weight change:  Last BM Date: 03/08/13  Intake/Output from previous day: 07/30 0701 - 07/31 0700 In: 1663.7 [P.O.:600; I.V.:1063.7] Out: -  Intake/Output this shift:    General appearance: alert Resp: clear to auscultation bilaterally Chest wall: no tenderness, left sided chest wall tenderness Cardio: regular rate and rhythm GI: soft, non-tender; bowel sounds normal; no masses,  no organomegaly  Lab Results:  Recent Labs  03/09/13 1736 03/10/13 0450  WBC 7.8 7.1  HGB 12.9 12.5  HCT 37.6 36.2  PLT 302 252   BMET  Recent Labs  03/09/13 1145 03/09/13 1736 03/10/13 0450  NA 141  --  138  K 4.3  --  4.6  CL 103  --  105  CO2 31  --  26  GLUCOSE 110*  --  139*  BUN 12  --  19  CREATININE 0.79 0.83 0.86  CALCIUM 9.5  --  9.0    Studies/Results: Dg Chest Portable 1 View  03/09/2013   *RADIOLOGY REPORT*  Clinical Data: Left chest pain, shortness of breath  PORTABLE CHEST - 1 VIEW  Comparison: 02/21/2013  Findings: Lungs are clear. No pleural effusion or pneumothorax.  The heart is normal in size.  Cervical spine fixation hardware.  IMPRESSION: No evidence of acute cardiopulmonary disease.   Original Report Authenticated By: Charline Bills, M.D.    Medications: I have reviewed the patient's current medications.  Assessment/Plan: CP/ h/o Coronary spasm and mild CAD- recent cath Atypical CP- chest wall sore- Marker and EKG negative, CXR negative H/o esophageal dysmotility/ GERD DDX esophageal dysmotility/  spasm; less likely cardiac Costochondritis Prednisone po for 5 day D/c nitro Gtt cardiology seen  D/c home  Out pt f/u  LOS: 1 day   Wava Kildow 03/10/2013, 7:32 AM

## 2013-09-21 ENCOUNTER — Inpatient Hospital Stay (HOSPITAL_COMMUNITY)
Admission: EM | Admit: 2013-09-21 | Discharge: 2013-09-27 | DRG: 281 | Disposition: A | Discharge status: Home / Self Care | Payer: Medicare Other | Attending: Cardiology | Admitting: Cardiology

## 2013-09-21 ENCOUNTER — Encounter (HOSPITAL_COMMUNITY): Payer: Self-pay | Admitting: Emergency Medicine

## 2013-09-21 ENCOUNTER — Emergency Department (HOSPITAL_COMMUNITY): Payer: Medicare Other

## 2013-09-21 DIAGNOSIS — I472 Ventricular tachycardia, unspecified: Secondary | ICD-10-CM | POA: Diagnosis not present

## 2013-09-21 DIAGNOSIS — R9431 Abnormal electrocardiogram [ECG] [EKG]: Secondary | ICD-10-CM

## 2013-09-21 DIAGNOSIS — F329 Major depressive disorder, single episode, unspecified: Secondary | ICD-10-CM | POA: Diagnosis present

## 2013-09-21 DIAGNOSIS — D649 Anemia, unspecified: Secondary | ICD-10-CM | POA: Diagnosis present

## 2013-09-21 DIAGNOSIS — K449 Diaphragmatic hernia without obstruction or gangrene: Secondary | ICD-10-CM | POA: Diagnosis present

## 2013-09-21 DIAGNOSIS — I201 Angina pectoris with documented spasm: Secondary | ICD-10-CM

## 2013-09-21 DIAGNOSIS — I252 Old myocardial infarction: Secondary | ICD-10-CM | POA: Diagnosis present

## 2013-09-21 DIAGNOSIS — I4729 Other ventricular tachycardia: Secondary | ICD-10-CM

## 2013-09-21 DIAGNOSIS — Z79899 Other long term (current) drug therapy: Secondary | ICD-10-CM

## 2013-09-21 DIAGNOSIS — I214 Non-ST elevation (NSTEMI) myocardial infarction: Principal | ICD-10-CM

## 2013-09-21 DIAGNOSIS — Z9861 Coronary angioplasty status: Secondary | ICD-10-CM

## 2013-09-21 DIAGNOSIS — K219 Gastro-esophageal reflux disease without esophagitis: Secondary | ICD-10-CM

## 2013-09-21 DIAGNOSIS — E7800 Pure hypercholesterolemia, unspecified: Secondary | ICD-10-CM | POA: Diagnosis present

## 2013-09-21 DIAGNOSIS — I10 Essential (primary) hypertension: Secondary | ICD-10-CM

## 2013-09-21 DIAGNOSIS — I498 Other specified cardiac arrhythmias: Secondary | ICD-10-CM | POA: Diagnosis present

## 2013-09-21 DIAGNOSIS — IMO0002 Reserved for concepts with insufficient information to code with codable children: Secondary | ICD-10-CM

## 2013-09-21 DIAGNOSIS — R7989 Other specified abnormal findings of blood chemistry: Secondary | ICD-10-CM

## 2013-09-21 DIAGNOSIS — E785 Hyperlipidemia, unspecified: Secondary | ICD-10-CM

## 2013-09-21 DIAGNOSIS — F3289 Other specified depressive episodes: Secondary | ICD-10-CM | POA: Diagnosis present

## 2013-09-21 DIAGNOSIS — F32A Depression, unspecified: Secondary | ICD-10-CM | POA: Diagnosis present

## 2013-09-21 DIAGNOSIS — R778 Other specified abnormalities of plasma proteins: Secondary | ICD-10-CM

## 2013-09-21 DIAGNOSIS — Z7982 Long term (current) use of aspirin: Secondary | ICD-10-CM

## 2013-09-21 DIAGNOSIS — I2510 Atherosclerotic heart disease of native coronary artery without angina pectoris: Secondary | ICD-10-CM

## 2013-09-21 DIAGNOSIS — F411 Generalized anxiety disorder: Secondary | ICD-10-CM | POA: Diagnosis present

## 2013-09-21 DIAGNOSIS — R0789 Other chest pain: Secondary | ICD-10-CM

## 2013-09-21 DIAGNOSIS — I249 Acute ischemic heart disease, unspecified: Secondary | ICD-10-CM

## 2013-09-21 DIAGNOSIS — G8929 Other chronic pain: Secondary | ICD-10-CM | POA: Diagnosis present

## 2013-09-21 DIAGNOSIS — Z87891 Personal history of nicotine dependence: Secondary | ICD-10-CM

## 2013-09-21 DIAGNOSIS — R079 Chest pain, unspecified: Secondary | ICD-10-CM

## 2013-09-21 HISTORY — DX: Ventricular tachycardia: I47.2

## 2013-09-21 HISTORY — DX: Angina pectoris with documented spasm: I20.1

## 2013-09-21 HISTORY — DX: Non-ST elevation (NSTEMI) myocardial infarction: I21.4

## 2013-09-21 LAB — CBC
HCT: 41.8 % (ref 36.0–46.0)
HCT: 36.7 % (ref 36.0–46.0)
Hemoglobin: 14.7 g/dL (ref 12.0–15.0)
Hemoglobin: 12.6 g/dL (ref 12.0–15.0)
MCH: 33 pg (ref 26.0–34.0)
MCH: 32.6 pg (ref 26.0–34.0)
MCHC: 35.2 g/dL (ref 30.0–36.0)
MCHC: 34.3 g/dL (ref 30.0–36.0)
MCV: 93.7 fL (ref 78.0–100.0)
MCV: 94.8 fL (ref 78.0–100.0)
Platelets: 315 10*3/uL (ref 150–400)
Platelets: 246 10*3/uL (ref 150–400)
RBC: 4.46 MIL/uL (ref 3.87–5.11)
RBC: 3.87 MIL/uL (ref 3.87–5.11)
RDW: 14.1 % (ref 11.5–15.5)
RDW: 14.2 % (ref 11.5–15.5)
WBC: 5.5 10*3/uL (ref 4.0–10.5)
WBC: 4.7 10*3/uL (ref 4.0–10.5)

## 2013-09-21 LAB — TROPONIN I
Troponin I: 0.31 ng/mL (ref <0.30)
Troponin I: <0.3 ng/mL (ref <0.30)
Troponin I: 2.3 ng/mL (ref <0.30)
Troponin I: 1.98 ng/mL (ref <0.30)

## 2013-09-21 LAB — BASIC METABOLIC PANEL
BUN: 12 mg/dL (ref 6–23)
CO2: 25 mEq/L (ref 19–32)
Calcium: 9.3 mg/dL (ref 8.4–10.5)
Chloride: 103 mEq/L (ref 96–112)
Creatinine, Ser: 0.53 mg/dL (ref 0.50–1.10)
GFR calc Af Amer: >90 mL/min (ref >90)
GFR calc non Af Amer: >90 mL/min (ref >90)
Glucose, Bld: 157 mg/dL — ABNORMAL HIGH (ref 70–99)
Potassium: 5.8 mEq/L — ABNORMAL HIGH (ref 3.7–5.3)
Sodium: 141 mEq/L (ref 137–147)

## 2013-09-21 LAB — PROTIME-INR
INR: 0.92 (ref 0.00–1.49)
Prothrombin Time: 12.2 seconds (ref 11.6–15.2)

## 2013-09-21 LAB — MAGNESIUM: Magnesium: 2.1 mg/dL (ref 1.5–2.5)

## 2013-09-21 LAB — CREATININE, SERUM
Creatinine, Ser: 0.71 mg/dL (ref 0.50–1.10)
GFR calc Af Amer: >90 mL/min (ref >90)
GFR calc non Af Amer: 87 mL/min — ABNORMAL LOW (ref >90)

## 2013-09-21 LAB — POTASSIUM: Potassium: 4.9 mEq/L (ref 3.7–5.3)

## 2013-09-21 LAB — PRO B NATRIURETIC PEPTIDE: Pro B Natriuretic peptide (BNP): 99.1 pg/mL (ref 0–125)

## 2013-09-21 MED ORDER — ATORVASTATIN CALCIUM 20 MG PO TABS
20.0000 mg | ORAL_TABLET | Freq: Every day | ORAL | Status: DC
  Administered 2013-09-21 – 2013-09-26 (×6): 20 mg via ORAL
  Filled 2013-09-21 (×7): qty 1

## 2013-09-21 MED ORDER — ALUM & MAG HYDROXIDE-SIMETH 200-200-20 MG/5ML PO SUSP
30.0000 mL | Freq: Three times a day (TID) | ORAL | Status: DC | PRN
  Administered 2013-09-21 – 2013-09-22 (×2): 30 mL via ORAL
  Filled 2013-09-21 (×2): qty 30

## 2013-09-21 MED ORDER — CITALOPRAM HYDROBROMIDE 20 MG PO TABS
20.0000 mg | ORAL_TABLET | Freq: Every day | ORAL | Status: DC
  Administered 2013-09-22 – 2013-09-27 (×6): 20 mg via ORAL
  Filled 2013-09-21 (×6): qty 1

## 2013-09-21 MED ORDER — SODIUM CHLORIDE 0.9 % IJ SOLN: 3.0000 mL | INTRAMUSCULAR | Status: DC | PRN

## 2013-09-21 MED ORDER — FENTANYL CITRATE 0.05 MG/ML IJ SOLN
50.0000 ug | Freq: Once | INTRAMUSCULAR | Status: AC
  Administered 2013-09-21: 50 ug via INTRAVENOUS
  Filled 2013-09-21: qty 2

## 2013-09-21 MED ORDER — PANTOPRAZOLE SODIUM 40 MG PO TBEC
40.0000 mg | DELAYED_RELEASE_TABLET | Freq: Every day | ORAL | Status: DC
  Administered 2013-09-21 – 2013-09-27 (×7): 40 mg via ORAL
  Filled 2013-09-21 (×7): qty 1

## 2013-09-21 MED ORDER — ENOXAPARIN SODIUM 40 MG/0.4ML ~~LOC~~ SOLN
40.0000 mg | SUBCUTANEOUS | Status: DC
  Administered 2013-09-21: 40 mg via SUBCUTANEOUS
  Filled 2013-09-21: qty 0.4

## 2013-09-21 MED ORDER — SODIUM CHLORIDE 0.9 % IJ SOLN
3.0000 mL | Freq: Two times a day (BID) | INTRAMUSCULAR | Status: DC
  Administered 2013-09-21 – 2013-09-26 (×9): 3 mL via INTRAVENOUS

## 2013-09-21 MED ORDER — ASPIRIN 81 MG PO CHEW
324.0000 mg | CHEWABLE_TABLET | Freq: Once | ORAL | Status: AC
  Administered 2013-09-21: 324 mg via ORAL
  Filled 2013-09-21: qty 4

## 2013-09-21 MED ORDER — ASPIRIN 81 MG PO CHEW
81.0000 mg | CHEWABLE_TABLET | Freq: Every day | ORAL | Status: DC
  Administered 2013-09-22 – 2013-09-27 (×6): 81 mg via ORAL
  Filled 2013-09-21 (×6): qty 1

## 2013-09-21 MED ORDER — CLONAZEPAM 0.5 MG PO TABS
0.5000 mg | ORAL_TABLET | Freq: Two times a day (BID) | ORAL | Status: DC | PRN
  Administered 2013-09-26: 0.5 mg via ORAL
  Filled 2013-09-21: qty 1

## 2013-09-21 MED ORDER — MORPHINE SULFATE 4 MG/ML IJ SOLN
4.0000 mg | Freq: Once | INTRAMUSCULAR | Status: AC
  Administered 2013-09-21: 4 mg via INTRAVENOUS
  Filled 2013-09-21: qty 1

## 2013-09-21 MED ORDER — SODIUM CHLORIDE 0.9 % IV SOLN
250.0000 mL | INTRAVENOUS | Status: DC | PRN
  Administered 2013-09-22: 250 mL via INTRAVENOUS

## 2013-09-21 MED ORDER — FAMOTIDINE 20 MG PO TABS
20.0000 mg | ORAL_TABLET | Freq: Every day | ORAL | Status: DC
  Administered 2013-09-21 – 2013-09-27 (×7): 20 mg via ORAL
  Filled 2013-09-21 (×7): qty 1

## 2013-09-21 MED ORDER — NITROGLYCERIN 0.4 MG SL SUBL: 0.4000 mg | SUBLINGUAL_TABLET | SUBLINGUAL | Status: DC | PRN

## 2013-09-21 MED ORDER — NITROGLYCERIN 0.4 MG SL SUBL
0.4000 mg | SUBLINGUAL_TABLET | SUBLINGUAL | Status: AC | PRN
  Administered 2013-09-21 (×3): 0.4 mg via SUBLINGUAL

## 2013-09-21 MED ORDER — RANOLAZINE ER 500 MG PO TB12
500.0000 mg | ORAL_TABLET | Freq: Two times a day (BID) | ORAL | Status: DC
  Administered 2013-09-21 – 2013-09-24 (×6): 500 mg via ORAL
  Filled 2013-09-21 (×7): qty 1

## 2013-09-21 MED ORDER — FERROUS SULFATE 325 (65 FE) MG PO TABS
325.0000 mg | ORAL_TABLET | Freq: Every day | ORAL | Status: DC
  Administered 2013-09-22 – 2013-09-27 (×6): 325 mg via ORAL
  Filled 2013-09-21 (×7): qty 1

## 2013-09-21 MED ORDER — GI COCKTAIL ~~LOC~~
30.0000 mL | Freq: Two times a day (BID) | ORAL | Status: DC | PRN
  Administered 2013-09-21 – 2013-09-24 (×2): 30 mL via ORAL
  Filled 2013-09-21 (×2): qty 30

## 2013-09-21 MED ORDER — AMLODIPINE BESYLATE 5 MG PO TABS
5.0000 mg | ORAL_TABLET | Freq: Every day | ORAL | Status: DC
  Administered 2013-09-22 – 2013-09-26 (×5): 5 mg via ORAL
  Filled 2013-09-21 (×6): qty 1

## 2013-09-21 MED ORDER — ALUMINUM-MAGNESIUM-SIMETHICONE 200-200-20 MG/5ML PO SUSP
30.0000 mL | Freq: Three times a day (TID) | ORAL | Status: DC | PRN
  Filled 2013-09-21: qty 30

## 2013-09-21 MED ORDER — GI COCKTAIL ~~LOC~~
30.0000 mL | Freq: Once | ORAL | Status: AC
  Administered 2013-09-21: 30 mL via ORAL
  Filled 2013-09-21: qty 30

## 2013-09-21 MED ORDER — SIMVASTATIN 40 MG PO TABS: 40.0000 mg | ORAL_TABLET | Freq: Every evening | ORAL | Status: DC

## 2013-09-21 MED ORDER — CALCIUM CARBONATE-VITAMIN D 500-200 MG-UNIT PO TABS
1.0000 | ORAL_TABLET | Freq: Two times a day (BID) | ORAL | Status: DC
  Administered 2013-09-21 – 2013-09-27 (×12): 1 via ORAL
  Filled 2013-09-21 (×13): qty 1

## 2013-09-21 MED ORDER — ISOSORBIDE MONONITRATE ER 60 MG PO TB24
120.0000 mg | ORAL_TABLET | Freq: Every day | ORAL | Status: DC
  Administered 2013-09-21 – 2013-09-27 (×7): 120 mg via ORAL
  Filled 2013-09-21 (×7): qty 2

## 2013-09-21 NOTE — ED Provider Notes (Signed)
CSN: [id number]     Arrival date & time 09/21/13  0909 History   First MD Initiated Contact with Patient 09/21/13 [phone]     Chief Complaint  Patient presents with  . Chest Pain     (Consider location/radiation/quality/duration/timing/severity/associated sxs/prior Treatment) HPI Pt is a 68yo female with hx of HTN, hypercholesteremia, anxiety, hiatal hernia, CAD, cardiac stent and shoulder surgery presenting today with chest pain that started last night, pain began to ease off last night but progressively worsening and wasn't able to get much sleep. Pain is sharp in nature starting in center of chest and radiating to her back.  Pain is 10/10.  Pt states she was lying down when pain started.  Reports taking daily 81mg  aspirin yesterday and nitro this morning, last dose around 8am w/o relief of chest pain. CP is associated with diaphoresis and nausea but no vomiting. Denies palpitations, SOB, fever or cough.   Past Medical History  Diagnosis Date  . Hypertension   . Hypercholesteremia   . GERD (gastroesophageal reflux disease)   . Anxiety   . Hiatal hernia   . Anemia   . Blood transfusion   . Coronary artery disease   . Coronary vasospasm    Past Surgical History  Procedure Laterality Date  . Cardiac stent Left 02/2013  . Shoulder surgery    . Breast      right, tumor   . Knee surgery  left   No family history on file. History  Substance Use Topics  . Smoking status: Former Smoker -- 1.00 packs/day for 40 years    Types: Cigarettes    Quit date: 10/10/2011  . Smokeless tobacco: Former Systems developer  . Alcohol Use: 0.6 oz/week    1 Cans of beer per week     Comment: occasional   OB History   Grav Para Term Preterm Abortions TAB SAB Ect Mult Living                 Review of Systems  Constitutional: Positive for diaphoresis. Negative for fever, chills and fatigue.  Respiratory: Negative for cough and shortness of breath.   Cardiovascular: Positive for chest pain. Negative for  palpitations and leg swelling.  Gastrointestinal: Positive for nausea. Negative for vomiting, abdominal pain and diarrhea.  Musculoskeletal: Positive for back pain ( upper ). Negative for myalgias, neck pain and neck stiffness.  All other systems reviewed and are negative.      Allergies  Review of patient's allergies indicates no known allergies.  Home Medications   Current Outpatient Rx  Name  Route  Sig  Dispense  Refill  . aluminum-magnesium hydroxide-simethicone (MAALOX) 426-834-19 MG/5ML SUSP   Oral   Take 30 mLs by mouth 3 (three) times daily as needed. After meals for indigestion         . amLODipine (NORVASC) 5 MG tablet   Oral   Take 5 mg by mouth daily.         Marland Kitchen aspirin 81 MG chewable tablet   Oral   Chew 81 mg by mouth daily.         . calcium-vitamin D (OSCAL WITH D) 500-200 MG-UNIT per tablet   Oral   Take 1 tablet by mouth 2 (two) times daily.         . citalopram (CELEXA) 20 MG tablet   Oral   Take 20 mg by mouth daily.         . clonazePAM (KLONOPIN) 0.5 MG tablet   Oral  Take 0.5 mg by mouth 2 (two) times daily as needed. For anxiety         . ferrous sulfate 325 (65 FE) MG tablet   Oral   Take 325 mg by mouth daily with breakfast.         . isosorbide mononitrate (IMDUR) 120 MG 24 hr tablet   Oral   Take 120 mg by mouth daily.         . nitroGLYCERIN (NITROSTAT) 0.4 MG SL tablet   Sublingual   Place 0.4 mg under the tongue every 5 (five) minutes as needed. For chest pain         . pantoprazole (PROTONIX) 40 MG tablet   Oral   Take 40 mg by mouth daily.         . ranitidine (ZANTAC) 150 MG tablet   Oral   Take 150 mg by mouth 2 (two) times daily.         . ranolazine (RANEXA) 500 MG 12 hr tablet   Oral   Take 500 mg by mouth 2 (two) times daily.         . simvastatin (ZOCOR) 40 MG tablet   Oral   Take 40 mg by mouth every evening.          BP 106/46  Pulse 57  Temp(Src) 98.5 F (36.9 C) (Oral)  Resp  24  Ht 5\' 4"  (1.626 m)  Wt 124 lb (56.246 kg)  BMI 21.27 kg/m2  SpO2 97% Physical Exam  Nursing note and vitals reviewed. Constitutional: She appears well-developed and well-nourished. No distress.  Pt lying comfortably in exam bed, NAD.   HENT:  Head: Normocephalic and atraumatic.  Eyes: Conjunctivae are normal. No scleral icterus.  Neck: Normal range of motion.  Cardiovascular: Normal rate, regular rhythm and normal heart sounds.   Pulmonary/Chest: Effort normal and breath sounds normal. No respiratory distress. She has no wheezes. She has no rales. She exhibits no tenderness.  No respiratory distress, able to speak in full sentences w/o difficulty. Lungs: CTAB  Abdominal: Soft. Bowel sounds are normal. She exhibits no distension and no mass. There is no tenderness. There is no rebound and no guarding.  Musculoskeletal: Normal range of motion.  Neurological: She is alert.  Skin: Skin is warm and dry. She is not diaphoretic.    ED Course  Procedures (including critical care time) Labs Review Labs Reviewed  BASIC METABOLIC PANEL - Abnormal; Notable for the following:    Potassium 5.8 (*)    Glucose, Bld 157 (*)    All other components within normal limits  TROPONIN I - Abnormal; Notable for the following:    Troponin I 0.31 (*)    All other components within normal limits  CBC  TROPONIN I  POTASSIUM   Imaging Review Dg Chest 2 View  09/21/2013   CLINICAL DATA:  Shortness of breath.  EXAM: CHEST  2 VIEW  COMPARISON:  03/09/2013 and 02/21/2013  FINDINGS: Heart size and pulmonary vascularity are normal and the lungs are clear. No effusions. No significant osseous abnormality.  IMPRESSION: Normal chest.   Electronically Signed   By: Rozetta Nunnery M.D.   On: 09/21/2013 10:01    EKG Interpretation    Date/Time:  Wednesday September 21 2013 09:12:55 EST Ventricular Rate:  58 PR Interval:  162 QRS Duration: 82 QT Interval:  504 QTC Calculation: 494 R Axis:   22 Text  Interpretation:  Sinus bradycardia Cannot rule out Anterior infarct , age  undetermined Abnormal ECG When compared with ECG of 03/09/2013 No significant change was found Confirmed by [facility]  MD, Nunzio Cory [phone]) on 09/21/2013 9:53:58 AM            MDM   Final diagnoses:  Chest pain    Pt with hx of CAD, HTN, cardiac stent c/o waxing and waning CP that started last night, not resolved with her daily aspirin of 81mg  or 2 doses of Nirtro, last dose at 8am.   Pt was given morphine and 324mg  aspirin upon arrival to ED.    EKG: consistent with previous. CXR: unremarkable  CBC: WNL BMP: slightly elevated K+, likely due to hemolysis.  Troponin: slightly elevated 0.31, possibly due to hemolysis as well. Will get repeat troponin and consult cardiology due to pt's significant cardiac hx and no relief in chest pain after 2 doses of nirto, aspirin and morphin.  Will give 3rd dose of nitro.  Pt's cardiologist is Dr. Daneen Schick.   11:00 AM Consulted with cardiology who agreed to come see pt.   2:06 PM Pt still c/o chest pain after nitro, aspirin, and morphine. Pt still waiting to be evaluated by cardiology. Will give fentanyl and GI cocktail for pain as this appears to be atypical chest pain, do not believe nitro drip will be beneficial at this time.    Cardiology has evaluated pt and recommended pt be admitted for observation.    Noland Fordyce, PA-C 09/21/13 1554

## 2013-09-21 NOTE — ED Notes (Signed)
Registration at bedside.

## 2013-09-21 NOTE — ED Notes (Signed)
Pt c/o central CP that radiates to back, reports it started yesterday while she was laying down. sts the pain eased off for a while then last night started back and wasn't able to get much sleep. Rating pain at 10/10. Nad, skin warm and dry, resp e/u.

## 2013-09-21 NOTE — ED Notes (Signed)
Cards at bedside

## 2013-09-21 NOTE — H&P (Signed)
Patient ID: [name] MRN: [id number], DOB/AGE: 1946-03-15   Admit date: 09/21/2013   Primary Physician: [name], MD Primary Cardiologist: Dr. Tamala Julian  Pt. Profile: 68 yo with history of CAD s/p remote stenting to RCA and PTCA of LAD, GERD, HLD, HTN and anxiety who presented to ED today with chest pain. She has had a history of atypical chest pain of uncertain etiology thought to be perhaps GI versus coronary vasospasm. She is currently on NTG, amloidipine, imdur and ranexa for chronic chest pain. Cath in 02/2013 revealed generalized left coronary vasoconstriction relieved with intracoronary nitroglycerin, 40% proximal LAD an eccentric 50% diagonal #1, widely patent circumflex and RCA including the RCA stent and low normal LV systolic function. She was seen admitted again two weeks later for recurring chest pain and was discharged with non cardiac chest pain and no further work up. At that time she described her pain as substernal radiating to the back with burning in her neck. She has had similar episodes of chest pain fairly frequently in the past. Her current chest pain started Sunday and worsened last night to the point that she could not sleep. It is described as a cramping in her chest that radiates to the back. It is worse with exertion and relieved with rest. It is 10/10 pain associated with diaphoresis, nausea and urgency to have a BM. She has had some loose stools. No SOB, abdominal pain, or LE swelling. It is not worse with deep inspiration or in certain positions. It is not related to meals. She quit smoking last year has ~ 15 pack year history. She occasionally drinks beer and does not take any other drugs. She takes all of her medications, including the ranenxa which sometimes helps. Lately NTG has not been working.    Problem List  Past Medical History  Diagnosis Date  . Hypertension   . Hypercholesteremia   . GERD (gastroesophageal reflux disease)   . Anxiety   .  Hiatal hernia   . Anemia   . Blood transfusion   . Coronary artery disease   . Coronary vasospasm   . NSTEMI (non-ST elevated myocardial infarction) 02/2013    Past Surgical History  Procedure Laterality Date  . Cardiac stent Left 02/2013  . Shoulder surgery    . Breast      right, tumor   . Knee surgery  left     Allergies  No Known Allergies  Home Medications  Prior to Admission medications   Medication Sig Start Date End Date Taking? Authorizing Provider  aluminum-magnesium hydroxide-simethicone (MAALOX) [phone] MG/5ML SUSP Take 30 mLs by mouth 3 (three) times daily as needed. After meals for indigestion   Yes Historical Provider, MD  amLODipine (NORVASC) 5 MG tablet Take 5 mg by mouth daily.   Yes Historical Provider, MD  aspirin 81 MG chewable tablet Chew 81 mg by mouth daily.   Yes Historical Provider, MD  calcium-vitamin D (OSCAL WITH D) 500-200 MG-UNIT per tablet Take 1 tablet by mouth 2 (two) times daily.   Yes Historical Provider, MD  citalopram (CELEXA) 20 MG tablet Take 20 mg by mouth daily.   Yes Historical Provider, MD  clonazePAM (KLONOPIN) 0.5 MG tablet Take 0.5 mg by mouth 2 (two) times daily as needed. For anxiety   Yes Historical Provider, MD  ferrous sulfate 325 (65 FE) MG tablet Take 325 mg by mouth daily with breakfast.   Yes Historical Provider, MD  isosorbide mononitrate (IMDUR) 120 MG 24 hr  tablet Take 120 mg by mouth daily.   Yes Historical Provider, MD  nitroGLYCERIN (NITROSTAT) 0.4 MG SL tablet Place 0.4 mg under the tongue every 5 (five) minutes as needed. For chest pain   Yes Historical Provider, MD  pantoprazole (PROTONIX) 40 MG tablet Take 40 mg by mouth daily.   Yes Historical Provider, MD  ranitidine (ZANTAC) 150 MG tablet Take 150 mg by mouth 2 (two) times daily.   Yes Historical Provider, MD  ranolazine (RANEXA) 500 MG 12 hr tablet Take 500 mg by mouth 2 (two) times daily.   Yes Historical Provider, MD  simvastatin (ZOCOR) 40 MG tablet Take 40  mg by mouth every evening.   Yes Historical Provider, MD    Family History  No family history on file.  Social History  History   Social History  . Marital Status: Married    Spouse Name: N/A    Number of Children: N/A  . Years of Education: N/A   Occupational History  . Not on file.   Social History Main Topics  . Smoking status: Former Smoker -- 1.00 packs/day for 40 years    Types: Cigarettes    Quit date: 10/10/2011  . Smokeless tobacco: Former Systems developer  . Alcohol Use: 0.6 oz/week    1 Cans of beer per week     Comment: occasional  . Drug Use: No  . Sexual Activity: No   Other Topics Concern  . Not on file   Social History Narrative  . No narrative on file     All other systems reviewed and are otherwise negative except as noted above.  Physical Exam  Blood pressure 102/56, pulse 63, temperature 98.5 F (36.9 C), temperature source Oral, resp. rate 21, height 5\' 4"  (1.626 m), weight 124 lb (56.246 kg), SpO2 100.00%.  General: Pleasant, NAD, thin, chronically ill appearing woman  Psych: Normal affect. Neuro: Alert and oriented X 3. Moves all extremities spontaneously. HEENT: Normal  Neck: Supple without bruits or JVD. Lungs:  Resp regular and unlabored, CTA. Heart: RRR no s3, s4, or murmurs. Abdomen: Soft, non-tender, non-distended, BS + x 4.  Extremities: No clubbing, cyanosis or edema. DP/PT/Radials 2+ and equal bilaterally.  Labs   Recent Labs  09/21/13 0927 09/21/13 1049  TROPONINI 0.31* <0.30   Lab Results  Component Value Date   WBC 5.5 09/21/2013   HGB 14.7 09/21/2013   HCT 41.8 09/21/2013   MCV 93.7 09/21/2013   PLT 315 09/21/2013     Recent Labs Lab 09/21/13 0927 09/21/13 1049  NA 141  --   K 5.8* 4.9  CL 103  --   CO2 25  --   BUN 12  --   CREATININE 0.53  --   CALCIUM 9.3  --   GLUCOSE 157*  --      Radiology/Studies Diagnostic Cardiac Catheterization Report  [name]  68 y.o.  female  1946-07-30  Procedure Date:  02/22/2013  Referring Physician: Mellody Memos, MD  Primary Cardiologist: h.[email or web address], iii, md  PROCEDURE: Left heart catheterization with selective coronary angiography, left ventriculogram.  INDICATIONS: ACS  The risks, benefits, and details of the procedure were explained to the patient. The patient verbalized understanding and wanted to proceed. Informed written consent was obtained.  PROCEDURE TECHNIQUE: After Xylocaine anesthesia a 4 French sheath was placed in the right femoral artery with a single anterior needle wall stick. Coronary angiography was done using a 4 [place] JR 4 and JL 4 catheter. Left ventriculography was  done using a JR 4 catheter by hand inject.  CONTRAST: Total of 65 cc.  COMPLICATIONS: None.  HEMODYNAMICS: Aortic pressure was 97 over 52 mmHg; LV pressure was 100/6; LVEDP 13. There was no gradient between the left ventricle and aorta.  ANGIOGRAPHIC DATA: The left main coronary artery is widely patent. Calcification is noted.  The left anterior descending artery is 40% proximal stenosis at the first diagonal. The first diagonal contains an eccentric 50% ostial narrowing.  The left circumflex artery is widely patent with a dominant obtuse marginal branch it increases significantly in size after intracoronary nitroglycerin..  The right coronary artery is dominant with no significant obstruction. Widely patent mid vessel stent .  LEFT VENTRICULOGRAM: Left ventricular angiogram was done in the 30 RAO projection and revealed normal left ventricular wall motion and systolic function with an estimated ejection fraction of 50 %. LVEDP was 13 mmHg.  IMPRESSIONS: 1. Generalized left coronary vasoconstriction relieved with intracoronary nitroglycerin  2. 40% proximal LAD an eccentric 50% diagonal #1.  3. Widely patent circumflex and RCA including the RCA stent  4. Low normal LV systolic function  RECOMMENDATION: Discontinue IV nitroglycerin. Continue antiplatelet therapy. Ambulate later  today. Consider discharge later this evening or in a.m.Marland Kitchen    2D ECHO Study Date: 02/22/2013 ------------------------------------------------------------ Indications: Chest pain 786.51 ------------------------------------------------------------ Study Conclusions - Left ventricle: The cavity size was normal. Systolic function was normal. The estimated ejection fraction was in the range of 50% to 55%. There is moderate hypokinesis of the entire inferoposterior myocardium. There was an increased relative contribution of atrial contraction to ventricular filling. Features are consistent with a pseudonormal left ventricular filling pattern, with concomitant abnormal relaxation and increased filling pressure (grade 2 diastolic - Mitral valve: Mild regurgitation.)   Dg Chest 2 View  09/21/2013   CLINICAL DATA:  Shortness of breath.  EXAM: CHEST  2 VIEW  COMPARISON:  03/09/2013 and 02/21/2013  FINDINGS: Heart size and pulmonary vascularity are normal and the lungs are clear. No effusions. No significant osseous abnormality.  IMPRESSION: Normal chest.     ECG  NSR HR 74  ASSESSMENT AND PLAN 68 yo with history of CAD s/p remote stenting to RCA and PTCA of LAD, GERD, HLD, HTN and anxiety who presented to ED today with chest pain.   Chest pain-She has had a history of atypical chest pain of uncertain etiology thought to be perhaps GI versus coronary vasospasm. She is currently on NTG, amloidipine, imdur and ranexa for chronic chest pain. Cath in 02/2013 revealed generalized left coronary vasoconstriction relieved with intracoronary nitroglycerin, 40% proximal LAD an eccentric 50% diagonal #1, widely patent circumflex and RCA including the RCA stent and low normal LV systolic function. She was seen admitted again two weeks later in 7/14 for recurring chest pain and was discharged with diagnosis of non-cardiac chest pain and no further work up.  -- pain today is similar to previous chronic pain.  --  first K and troponin drawn in ED were mildly elevated which was thought to be from hemolysis. Labs were re-drawn and found to be normal -- EKG with no acute ST or TW changes -- cycle troponin, serial ECGs -- no heparin unless enzymes return positive.  -- if she rules out, consider stress test in the AM or as an outpatient   LV Dysfunction- ECHO 02/2013 revealed low normal EF 50- 55% with moderate hypokinesis of entire inferoposterior myocardium, grade 2 diastolic dysfunction and mild MR. -- No s/s of heart failure although  except she admits to 2 pillow orthopnea, CXR with no cardiopulmonary process.  -- Continue to monitor  HTN- patients BP treated in the setting of her CAD and chronic chest pain. Continue her home regimen. - BP soft, continue to monitor  HLD- continue statin   GERD- continue protonix/zantac  Signed, Perry Mount, PA-C 09/21/2013, 12:56 PM  Pager [phone]   History and all data above reviewed.  Patient examined.  I agree with the findings as above.  She presents with pain that has been constant.  She says that it is similar to her previous chronic pain.  She did not get relief with NTG at home or here.  She did not get relief with morphine in the ER.  Enzymes negative and EKG is nonacute.  The patient exam reveals COR:RRR  ,  Lungs: Clear  ,  Abd: Positive bowel sounds, no rebound no guarding, Ext No edema  .  All available labs, radiology testing, previous records reviewed. Agree with documented assessment and plan. Chest pain.  Atypical without objective evidence of ischemia.  She will get a GI cocktail in the ER.  She will be observed overnight.  I do not think that further invasive or noninvasive studies will be needed.    Jeneen Rinks [name]  2:11 PM  09/21/2013

## 2013-09-21 NOTE — ED Notes (Signed)
Attempted to gain IV access, unsuccessful. Able to obtain blood for labs.

## 2013-09-21 NOTE — ED Notes (Signed)
Pt reports chest pain that started last night and became worse this morning. Reports cardiac hx with stents. Dr. Tamala Julian is her cards. Also reports SOB and n/v.

## 2013-09-21 NOTE — ED Notes (Signed)
Phlebotomy at bedside.

## 2013-09-21 NOTE — ED Notes (Signed)
Called phlebotomy to redraw potassium and troponin.

## 2013-09-21 NOTE — ED Notes (Signed)
Pt requesting food and drink. Will check with PA if pt is allowed to eat.

## 2013-09-22 DIAGNOSIS — I200 Unstable angina: Secondary | ICD-10-CM

## 2013-09-22 DIAGNOSIS — I10 Essential (primary) hypertension: Secondary | ICD-10-CM

## 2013-09-22 DIAGNOSIS — E785 Hyperlipidemia, unspecified: Secondary | ICD-10-CM

## 2013-09-22 DIAGNOSIS — R079 Chest pain, unspecified: Secondary | ICD-10-CM

## 2013-09-22 DIAGNOSIS — K219 Gastro-esophageal reflux disease without esophagitis: Secondary | ICD-10-CM

## 2013-09-22 LAB — COMPREHENSIVE METABOLIC PANEL
ALT: 11 U/L (ref 0–35)
AST: 23 U/L (ref 0–37)
Albumin: 3.2 g/dL — ABNORMAL LOW (ref 3.5–5.2)
Alkaline Phosphatase: 77 U/L (ref 39–117)
BUN: 15 mg/dL (ref 6–23)
CO2: 26 mEq/L (ref 19–32)
Calcium: 9 mg/dL (ref 8.4–10.5)
Chloride: 103 mEq/L (ref 96–112)
Creatinine, Ser: 0.8 mg/dL (ref 0.50–1.10)
GFR calc Af Amer: 86 mL/min — ABNORMAL LOW (ref >90)
GFR calc non Af Amer: 75 mL/min — ABNORMAL LOW (ref >90)
Glucose, Bld: 114 mg/dL — ABNORMAL HIGH (ref 70–99)
Potassium: 5 mEq/L (ref 3.7–5.3)
Sodium: 142 mEq/L (ref 137–147)
Total Bilirubin: 0.2 mg/dL — ABNORMAL LOW (ref 0.3–1.2)
Total Protein: 6.1 g/dL (ref 6.0–8.3)

## 2013-09-22 LAB — CK TOTAL AND CKMB (NOT AT ARMC)
CK, MB: 4.2 ng/mL — ABNORMAL HIGH (ref 0.3–4.0)
Relative Index: INVALID (ref 0.0–2.5)
Total CK: 96 U/L (ref 7–177)

## 2013-09-22 LAB — HEPARIN LEVEL (UNFRACTIONATED)
Heparin Unfractionated: 0.52 IU/mL (ref 0.30–0.70)
Heparin Unfractionated: 0.37 IU/mL (ref 0.30–0.70)

## 2013-09-22 LAB — TROPONIN I
Troponin I: 0.76 ng/mL (ref <0.30)
Troponin I: 0.66 ng/mL (ref <0.30)
Troponin I: 1.08 ng/mL (ref <0.30)

## 2013-09-22 LAB — LIPID PANEL
Cholesterol: 189 mg/dL (ref 0–200)
HDL: 90 mg/dL (ref >39)
LDL Cholesterol: 87 mg/dL (ref 0–99)
Total CHOL/HDL Ratio: 2.1 RATIO
Triglycerides: 62 mg/dL (ref <150)
VLDL: 12 mg/dL (ref 0–40)

## 2013-09-22 LAB — CBC
HCT: 35.9 % — ABNORMAL LOW (ref 36.0–46.0)
Hemoglobin: 12.3 g/dL (ref 12.0–15.0)
MCH: 32.5 pg (ref 26.0–34.0)
MCHC: 34.3 g/dL (ref 30.0–36.0)
MCV: 95 fL (ref 78.0–100.0)
Platelets: 252 10*3/uL (ref 150–400)
RBC: 3.78 MIL/uL — ABNORMAL LOW (ref 3.87–5.11)
RDW: 14.2 % (ref 11.5–15.5)
WBC: 5.5 10*3/uL (ref 4.0–10.5)

## 2013-09-22 LAB — TSH: TSH: 1.76 u[IU]/mL (ref 0.350–4.500)

## 2013-09-22 LAB — HEMOGLOBIN A1C
Hgb A1c MFr Bld: 6 % — ABNORMAL HIGH (ref <5.7)
Mean Plasma Glucose: 126 mg/dL — ABNORMAL HIGH (ref <117)

## 2013-09-22 MED ORDER — HYDROMORPHONE HCL PF 1 MG/ML IJ SOLN
1.0000 mg | Freq: Once | INTRAMUSCULAR | Status: AC
  Administered 2013-09-22 (×2): 1 mg via INTRAVENOUS

## 2013-09-22 MED ORDER — HYDROMORPHONE HCL PF 1 MG/ML IJ SOLN
INTRAMUSCULAR | Status: AC
  Administered 2013-09-22: 1 mg via INTRAVENOUS
  Filled 2013-09-22: qty 1

## 2013-09-22 MED ORDER — HYDROMORPHONE HCL 2 MG PO TABS
2.0000 mg | ORAL_TABLET | Freq: Once | ORAL | Status: AC
  Administered 2013-09-22: 2 mg via ORAL
  Filled 2013-09-22: qty 1

## 2013-09-22 MED ORDER — HYDROMORPHONE HCL PF 1 MG/ML IJ SOLN
1.0000 mg | INTRAMUSCULAR | Status: DC | PRN
  Administered 2013-09-22 – 2013-09-26 (×10): 1 mg via INTRAVENOUS
  Filled 2013-09-22 (×10): qty 1

## 2013-09-22 MED ORDER — HEPARIN BOLUS VIA INFUSION
1500.0000 [IU] | Freq: Once | INTRAVENOUS | Status: AC
  Administered 2013-09-22: 1500 [IU] via INTRAVENOUS
  Filled 2013-09-22: qty 1500

## 2013-09-22 MED ORDER — MORPHINE SULFATE 2 MG/ML IJ SOLN
1.0000 mg | Freq: Once | INTRAMUSCULAR | Status: AC
  Administered 2013-09-22: 1 mg via INTRAVENOUS
  Filled 2013-09-22: qty 1

## 2013-09-22 MED ORDER — HEPARIN (PORCINE) IN NACL 100-0.45 UNIT/ML-% IJ SOLN
900.0000 [IU]/h | INTRAMUSCULAR | Status: DC
  Administered 2013-09-22: 650 [IU]/h via INTRAVENOUS
  Administered 2013-09-23: 700 [IU]/h via INTRAVENOUS
  Administered 2013-09-23: 900 [IU]/h via INTRAVENOUS
  Filled 2013-09-22 (×5): qty 250

## 2013-09-22 MED ORDER — SODIUM CHLORIDE 0.9 % IV BOLUS (SEPSIS)
500.0000 mL | Freq: Once | INTRAVENOUS | Status: AC
  Administered 2013-09-22: 500 mL via INTRAVENOUS

## 2013-09-22 NOTE — Progress Notes (Signed)
Notified MD Aundra Dubin of 1.98 troponin level and chest pain present unrelieved by medications given earlier; new orders given

## 2013-09-22 NOTE — Progress Notes (Addendum)
Notified MD Aundra Dubin of elevated troponin of 2.30; no new orders given

## 2013-09-22 NOTE — Progress Notes (Signed)
Pt having consistent CP 7/10 MD notified. New orders given for 2mg  PO Dilaudid. No change in pain. New orders received for IV dilaudid. 1mg  dilaudid given to patient. Pt states her pain is slightly better now 6/10. No EKG changes, per Doctor P. Ross and the patients enzymes are continuing to trend down despite the continual CP. Pt is on IV heparin. Nitro, morphine, GI cocktail did not help with the pain. Per patients daughter IV Dilaudid is the only thing that helps with the pain and the patient sleeps it off. Pt is still in pain at this point mid chest. Will keep patient on clear liquid diet. Dilaudid 1mg  Q4 prn for pain order received from Dr. Lizbeth Bark. Pain control overnight with the Dilaudid. Reassess in the am. Will monitor.

## 2013-09-22 NOTE — Progress Notes (Signed)
ANTICOAGULATION CONSULT NOTE - Follow Up Consult  Pharmacy Consult for Heparin Indication: chest pain/ACS  No Known Allergies  Patient Measurements: Height: 5\' 4"  (162.6 cm) Weight: 130 lb (58.968 kg) IBW/kg (Calculated) : 54.7 Heparin Dosing Weight: 59kg  Vital Signs: Temp: 97.8 F (36.6 C) (02/12 1428) Temp src: Oral (02/12 1428) BP: 104/59 mmHg (02/12 1428) Pulse Rate: 58 (02/12 1428)  Labs:  Recent Labs  09/21/13 0927  09/21/13 1920 09/21/13 2240 09/22/13 0425 09/22/13 0928 09/22/13 1305 09/22/13 1530  HGB 14.7  --  12.6  --  12.3  --   --   --   HCT 41.8  --  36.7  --  35.9*  --   --   --   PLT 315  --  246  --  252  --   --   --   LABPROT  --   --  12.2  --   --   --   --   --   INR  --   --  0.92  --   --   --   --   --   HEPARINUNFRC  --   --   --   --   --  0.52  --  0.37  CREATININE 0.53  --  0.71  --  0.80  --   --   --   TROPONINI 0.31*  < > 2.30* 1.98* 0.76*  --  0.66*  --   < > = values in this interval not displayed.  Estimated Creatinine Clearance: 58.9 ml/min (by C-G formula based on Cr of 0.8).   Medications:  Heparin drip 650 units/hr  Assessment: 67yof continuing on heparin for CP/ACS. Heparin level (0.37) remains therapeutic - will continue current rate and follow-up AM level. - H/H and Plts wnl - No significant bleeding reported  Goal of Therapy:  Heparin level 0.3-0.7 units/ml Monitor platelets by anticoagulation protocol: Yes   Plan:  1. Continue heparin 650 units/hr (6.5 ml/hr) 2. Follow-up AM heparin level and CBC  Earleen Newport [phone] 09/22/2013,4:52 PM

## 2013-09-22 NOTE — Progress Notes (Signed)
ANTICOAGULATION CONSULT NOTE - Follow Up Consult  Pharmacy Consult for Heparin Indication: chest pain/ACS  No Known Allergies  Patient Measurements: Height: 5\' 4"  (162.6 cm) Weight: 130 lb (58.968 kg) IBW/kg (Calculated) : 54.7 Heparin Dosing Weight: 58 kg  Vital Signs: Temp: 97.5 F (36.4 C) (02/12 0735) Temp src: Oral (02/12 0735) BP: 108/64 mmHg (02/12 0943) Pulse Rate: 57 (02/12 0735)  Labs:  Recent Labs  09/21/13 0927  09/21/13 1920 09/21/13 2240 09/22/13 0425 09/22/13 0928  HGB 14.7  --  12.6  --  12.3  --   HCT 41.8  --  36.7  --  35.9*  --   PLT 315  --  246  --  252  --   LABPROT  --   --  12.2  --   --   --   INR  --   --  0.92  --   --   --   HEPARINUNFRC  --   --   --   --   --  0.52  CREATININE 0.53  --  0.71  --  0.80  --   TROPONINI 0.31*  < > 2.30* 1.98* 0.76*  --   < > = values in this interval not displayed.  Estimated Creatinine Clearance: 58.9 ml/min (by C-G formula based on Cr of 0.8).   Medications:  Scheduled:  . amLODipine  5 mg Oral Daily  . aspirin  81 mg Oral Daily  . atorvastatin  20 mg Oral q1800  . calcium-vitamin D  1 tablet Oral BID  . citalopram  20 mg Oral Daily  . famotidine  20 mg Oral Daily  . ferrous sulfate  325 mg Oral Q breakfast  . isosorbide mononitrate  120 mg Oral Daily  . pantoprazole  40 mg Oral Daily  . ranolazine  500 mg Oral BID  . sodium chloride  3 mL Intravenous Q12H   Infusions:  . heparin 650 Units/hr (09/22/13 0139)    Assessment: 68 yo F with hx CAD presented to the ED with chest pain.  Troponin elevated.  Cardiology debating cath vs. med mgmt.  Heparin level is therapeutic on 650 units/hr.  Will repeat heparin level this afternoon for confirmation.  Goal of Therapy:  Heparin level 0.3-0.7 units/ml Monitor platelets by anticoagulation protocol: Yes   Plan:  Continue heparin at 650 units/hr. Heparin level at 1500 Heparin level and CBC daily Follow up plans per cardiology.  [employer], Pharm.D., BCPS Clinical Pharmacist Pager [phone] 09/22/2013 12:48 PM

## 2013-09-22 NOTE — Progress Notes (Addendum)
ANTICOAGULATION CONSULT NOTE - Initial Consult  Pharmacy Consult for Heparin Indication: chest pain/ACS  No Known Allergies  Patient Measurements: Height: 5\' 4"  (162.6 cm) Weight: 123 lb 6.9 oz (55.988 kg) IBW/kg (Calculated) : 54.7  Vital Signs: Temp: 97.7 F (36.5 C) (02/11 1956) Temp src: Oral (02/11 1956) BP: 95/59 mmHg (02/12 0029) Pulse Rate: 55 (02/11 1956)  Labs:  Recent Labs  09/21/13 0927 09/21/13 1049 09/21/13 1920 09/21/13 2240  HGB 14.7  --  12.6  --   HCT 41.8  --  36.7  --   PLT 315  --  246  --   LABPROT  --   --  12.2  --   INR  --   --  0.92  --   CREATININE 0.53  --  0.71  --   TROPONINI 0.31* <0.30 2.30* 1.98*    Estimated Creatinine Clearance: 58.9 ml/min (by C-G formula based on Cr of 0.71).   Medical History: Past Medical History  Diagnosis Date  . Hypertension   . Hypercholesteremia   . GERD (gastroesophageal reflux disease)   . Anxiety   . Hiatal hernia   . Anemia   . Blood transfusion   . Coronary artery disease   . Coronary vasospasm     Medications:  Prescriptions prior to admission  Medication Sig Dispense Refill  . aluminum-magnesium hydroxide-simethicone (MAALOX) [phone] MG/5ML SUSP Take 30 mLs by mouth 3 (three) times daily as needed. After meals for indigestion      . amLODipine (NORVASC) 5 MG tablet Take 5 mg by mouth daily.      Marland Kitchen aspirin 81 MG chewable tablet Chew 81 mg by mouth daily.      . calcium-vitamin D (OSCAL WITH D) 500-200 MG-UNIT per tablet Take 1 tablet by mouth 2 (two) times daily.      . citalopram (CELEXA) 20 MG tablet Take 20 mg by mouth daily.      . clonazePAM (KLONOPIN) 0.5 MG tablet Take 0.5 mg by mouth 2 (two) times daily as needed. For anxiety      . ferrous sulfate 325 (65 FE) MG tablet Take 325 mg by mouth daily with breakfast.      . isosorbide mononitrate (IMDUR) 120 MG 24 hr tablet Take 120 mg by mouth daily.      . nitroGLYCERIN (NITROSTAT) 0.4 MG SL tablet Place 0.4 mg under the tongue  every 5 (five) minutes as needed. For chest pain      . pantoprazole (PROTONIX) 40 MG tablet Take 40 mg by mouth daily.      . ranitidine (ZANTAC) 150 MG tablet Take 150 mg by mouth 2 (two) times daily.      . ranolazine (RANEXA) 500 MG 12 hr tablet Take 500 mg by mouth 2 (two) times daily.      . simvastatin (ZOCOR) 40 MG tablet Take 40 mg by mouth every evening.        Assessment: 68 yo female with chest pain/elevated cardiac enzymes, for heparin.  Received Lovenox 40 mg SQ at 2130  Goal of Therapy:  Heparin level 0.3-0.7 units/ml Monitor platelets by anticoagulation protocol: Yes   Plan:  Heparin 1500 units IV bolus, then 650 units/hr Check heparin level in 6 hours.   Achol Azpeitia, Bronson Curb 09/22/2013,1:10 AM

## 2013-09-22 NOTE — Progress Notes (Signed)
Patient Name: [name] Date of Encounter: 09/22/2013     Active Problems:   Atypical chest pain   HTN (hypertension)   Dyslipidemia   GERD (gastroesophageal reflux disease)   Depression   Chest pain    SUBJECTIVE  She is having ongoing pain that does not respond to NTG, GI cocktail or morphine. It is substernal and radiates to her back and neck. She has intermittent episodes of diaphoresis with this pain and feels slightly nauseated.   CURRENT MEDS . amLODipine  5 mg Oral Daily  . aspirin  81 mg Oral Daily  . atorvastatin  20 mg Oral q1800  . calcium-vitamin D  1 tablet Oral BID  . citalopram  20 mg Oral Daily  . famotidine  20 mg Oral Daily  . ferrous sulfate  325 mg Oral Q breakfast  . isosorbide mononitrate  120 mg Oral Daily  . pantoprazole  40 mg Oral Daily  . ranolazine  500 mg Oral BID  . sodium chloride  3 mL Intravenous Q12H    OBJECTIVE  Filed Vitals:   09/22/13 0029 09/22/13 0406 09/22/13 0555 09/22/13 0735  BP: 95/59 96/55  92/59  Pulse:  57  57  Temp:  97.5 F (36.4 C)  97.5 F (36.4 C)  TempSrc:  Oral  Oral  Resp:  18  18  Height:      Weight: 123 lb 6.9 oz (55.988 kg)  130 lb (58.968 kg)   SpO2:  100%  100%    Intake/Output Summary (Last 24 hours) at 09/22/13 0803 Last data filed at 09/22/13 4008  Gross per 24 hour  Intake    280 ml  Output      0 ml  Net    280 ml   Filed Weights   09/21/13 0914 09/22/13 0029 09/22/13 0555  Weight: 124 lb (56.246 kg) 123 lb 6.9 oz (55.988 kg) 130 lb (58.968 kg)    PHYSICAL EXAM  General: Pleasant, uncomfortable appearing.  Neuro: Alert and oriented X 3. Moves all extremities spontaneously. Psych: Normal affect. HEENT:  Normal  Neck: Supple without bruits or JVD. Lungs:  Resp regular and unlabored, CTA. Heart: RRR no s3, s4, or murmurs. Abdomen: Soft, non-tender, non-distended, BS + x 4.  Extremities: No clubbing, cyanosis or edema. DP/PT/Radials 2+ and equal bilaterally.  Accessory  Clinical Findings  CBC  Recent Labs  09/21/13 1920 09/22/13 0425  WBC 4.7 5.5  HGB 12.6 12.3  HCT 36.7 35.9*  MCV 94.8 95.0  PLT 246 676   Basic Metabolic Panel  Recent Labs  09/21/13 0927 09/21/13 1049 09/21/13 1920 09/22/13 0425  NA 141  --   --  142  Jillian 5.8* 4.9  --  5.0  CL 103  --   --  103  CO2 25  --   --  26  GLUCOSE 157*  --   --  114*  BUN 12  --   --  15  CREATININE 0.53  --  0.71 0.80  CALCIUM 9.3  --   --  9.0  MG  --   --  2.1  --    Liver Function Tests  Recent Labs  09/22/13 0425  AST 23  ALT 11  ALKPHOS 77  BILITOT 0.2*  PROT 6.1  ALBUMIN 3.2*    Cardiac Enzymes  Recent Labs  09/21/13 1920 09/21/13 2240 09/22/13 0425  TROPONINI 2.30* 1.98* 0.76*    Hemoglobin A1C  Recent Labs  09/21/13 1920  HGBA1C 6.0*   Fasting Lipid Panel  Recent Labs  09/22/13 0425  CHOL 189  HDL 90  LDLCALC 87  TRIG 62  CHOLHDL 2.1   Thyroid Function Tests  Recent Labs  09/21/13 1920  TSH 1.760    TELE  NSR, some episodes of sinus brady  ECG  NSR   Radiology/Studies  Dg Chest 2 View  09/21/2013   CLINICAL DATA:  Shortness of breath.  EXAM: CHEST  2 VIEW  COMPARISON:  03/09/2013 and 02/21/2013  FINDINGS: Heart size and pulmonary vascularity are normal and the lungs are clear. No effusions. No significant osseous abnormality.  IMPRESSION: Normal chest.      ASSESSMENT AND PLAN 68 yo with history of CAD s/p remote stenting to RCA and PTCA of LAD, GERD, HLD, HTN and anxiety who presented to ED today with chest pain.    Chest pain-She has had a history of atypical chest pain of uncertain etiology thought to be perhaps GI versus coronary vasospasm. She is currently on NTG, amloidipine, imdur and ranexa for chronic chest pain. Cath in 02/2013 revealed generalized left coronary vasoconstriction relieved with intracoronary nitroglycerin, 40% proximal LAD an eccentric 50% diagonal #1, widely patent circumflex and RCA including the RCA stent  and low normal LV systolic function. She was seen admitted again two weeks later in 7/14 for recurring chest pain and was discharged with diagnosis of non-cardiac chest pain and no further work up.   -- pain today is similar to previous chronic pain.  -- EKG with no acute ST or TW changes  -- Troponin + (0.30-->2.3 --> 1.98-->0.76) Now on heparin and NPO  --She was hospitalized with a troponin elevation in 02/2013 and had a cath showing an unchanged diagonal stenosis. 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 (see Dr. Irish [name] consult note on 03/09/13) -- Consider cath or continue to treat medically. Dr. Harrington [name] to see.   LV Dysfunction- ECHO 02/2013 revealed low normal EF 50- 55% with moderate hypokinesis of entire inferoposterior myocardium, grade 2 diastolic dysfunction and mild MR.  -- No s/s of heart failure although except she admits to 2 pillow orthopnea, CXR with no cardiopulmonary process.  -- Continue to monitor  HTN- patients BP treated in the setting of her CAD and chronic chest pain. Continue her home regimen.  - BP soft, continue to monitor. Will give a bolus of fluids.  HLD- continue statin   GERD- continue protonix/zantac   Signed, Jillian Mount PA-C  Pager [phone]  [name] seen and examined Agree with findings of [name]   Difficult situation Patient with several caths that have shown vasospasm without signif CAD  Last cath was 2014. She has ruled in With positive troponin  These are  trending down. Still with CP  EKG negative.   Plan pain meds (dilaudid)  Repeat troponin.   Repeat EKG. Medical Rx for now. BP on arrival much higher than now  ? If taking meds at home.    [name]

## 2013-09-22 NOTE — Progress Notes (Signed)
UR completed 

## 2013-09-23 LAB — TROPONIN I: Troponin I: 0.52 ng/mL (ref <0.30)

## 2013-09-23 LAB — CBC
HCT: 35.9 % — ABNORMAL LOW (ref 36.0–46.0)
Hemoglobin: 12 g/dL (ref 12.0–15.0)
MCH: 32.2 pg (ref 26.0–34.0)
MCHC: 33.4 g/dL (ref 30.0–36.0)
MCV: 96.2 fL (ref 78.0–100.0)
Platelets: 225 10*3/uL (ref 150–400)
RBC: 3.73 MIL/uL — ABNORMAL LOW (ref 3.87–5.11)
RDW: 14.1 % (ref 11.5–15.5)
WBC: 5.5 10*3/uL (ref 4.0–10.5)

## 2013-09-23 LAB — HEPARIN LEVEL (UNFRACTIONATED)
Heparin Unfractionated: 0.27 IU/mL — ABNORMAL LOW (ref 0.30–0.70)
Heparin Unfractionated: 0.18 IU/mL — ABNORMAL LOW (ref 0.30–0.70)
Heparin Unfractionated: 0.44 IU/mL (ref 0.30–0.70)

## 2013-09-23 MED ORDER — MAGNESIUM SULFATE 40 MG/ML IJ SOLN
2.0000 g | Freq: Once | INTRAMUSCULAR | Status: AC
  Administered 2013-09-23: 2 g via INTRAVENOUS
  Filled 2013-09-23: qty 50

## 2013-09-23 NOTE — Progress Notes (Signed)
Patient changed to inpatient- requiring IV heparin gtt.

## 2013-09-23 NOTE — Progress Notes (Signed)
ANTICOAGULATION CONSULT NOTE - Follow Up Consult  Pharmacy Consult for heparin Indication: chest pain/ACS  Labs:  Recent Labs  09/21/13 0927  09/21/13 1920  09/22/13 0425 09/22/13 0928 09/22/13 1305 09/22/13 1530 09/22/13 1950 09/22/13 2000 09/23/13 0520  HGB 14.7  --  12.6  --  12.3  --   --   --   --   --  12.0  HCT 41.8  --  36.7  --  35.9*  --   --   --   --   --  35.9*  PLT 315  --  246  --  252  --   --   --   --   --  225  LABPROT  --   --  12.2  --   --   --   --   --   --   --   --   INR  --   --  0.92  --   --   --   --   --   --   --   --   HEPARINUNFRC  --   --   --   --   --  0.52  --  0.37  --   --  0.27*  CREATININE 0.53  --  0.71  --  0.80  --   --   --   --   --   --   CKTOTAL  --   --   --   --   --   --   --   --  96  --   --   CKMB  --   --   --   --   --   --   --   --  4.2*  --   --   TROPONINI 0.31*  < > 2.30*  < > 0.76*  --  0.66*  --   --  1.08* 0.52*  < > = values in this interval not displayed.   Assessment: 68yo female now subtherapeutic on heparin after two levels at goal though trending down.  Goal of Therapy:  Heparin level 0.3-0.7 units/ml   Plan:  Will increase heparin gtt slightly to 700 units/hr and check level in [place], PharmD, BCPS  09/23/2013,6:59 AM

## 2013-09-23 NOTE — Progress Notes (Addendum)
Subjective: Still with pain, better than yesterday  Now 5/10 Objective: Filed Vitals:   09/22/13 0943 09/22/13 1428 09/22/13 2100 09/23/13 0447  BP: 108/64 104/59 99/50 96/50   Pulse:  58 60 51  Temp:  97.8 F (36.6 C) 97.9 F (36.6 C) 98.4 F (36.9 C)  TempSrc:  Oral Oral Oral  Resp:  18 18 18   Height:      Weight:    136 lb 8 oz (61.916 kg)  SpO2:  100% 100% 99%   Weight change: 12 lb 8 oz (5.67 kg)  Intake/Output Summary (Last 24 hours) at 09/23/13 0728 Last data filed at 09/22/13 0834  Gross per 24 hour  Intake      0 ml  Output      0 ml  Net      0 ml    General: Alert, awake, oriented x3, in no acute distress Neck:  JVP is normal Heart: Regular rate and rhythm, without murmurs, rubs, gallops.  Lungs: Clear to auscultation.  No rales or wheezes. Exemities:  No edema.   Neuro: Grossly intact, nonfocal.  Tele:  SR Lab Results: Results for orders placed during the hospital encounter of 09/21/13 (from the past 24 hour(s))  HEPARIN LEVEL (UNFRACTIONATED)     Status: None   Collection Time    09/22/13  9:28 AM      Result Value Ref Range   Heparin Unfractionated 0.52  0.30 - 0.70 IU/mL  TROPONIN I     Status: Abnormal   Collection Time    09/22/13  1:05 PM      Result Value Ref Range   Troponin I 0.66 (*) <0.30 ng/mL  HEPARIN LEVEL (UNFRACTIONATED)     Status: None   Collection Time    09/22/13  3:30 PM      Result Value Ref Range   Heparin Unfractionated 0.37  0.30 - 0.70 IU/mL  CK TOTAL AND CKMB     Status: Abnormal   Collection Time    09/22/13  7:50 PM      Result Value Ref Range   Total CK 96  7 - 177 U/L   CK, MB 4.2 (*) 0.3 - 4.0 ng/mL   Relative Index RELATIVE INDEX IS INVALID  0.0 - 2.5  TROPONIN I     Status: Abnormal   Collection Time    09/22/13  8:00 PM      Result Value Ref Range   Troponin I 1.08 (*) <0.30 ng/mL  HEPARIN LEVEL (UNFRACTIONATED)     Status: Abnormal   Collection Time    09/23/13  5:20 AM      Result Value Ref Range   Heparin Unfractionated 0.27 (*) 0.30 - 0.70 IU/mL  CBC     Status: Abnormal   Collection Time    09/23/13  5:20 AM      Result Value Ref Range   WBC 5.5  4.0 - 10.5 K/uL   RBC 3.73 (*) 3.87 - 5.11 MIL/uL   Hemoglobin 12.0  12.0 - 15.0 g/dL   HCT 35.9 (*) 36.0 - 46.0 %   MCV 96.2  78.0 - 100.0 fL   MCH 32.2  26.0 - 34.0 pg   MCHC 33.4  30.0 - 36.0 g/dL   RDW 14.1  11.5 - 15.5 %   Platelets 225  150 - 400 K/uL  TROPONIN I     Status: Abnormal   Collection Time    09/23/13  5:20 AM  Result Value Ref Range   Troponin I 0.52 (*) <0.30 ng/mL    Studies/Results: @RISRSLT24 @  Medications: Reviewed   @PROBHOSP @  1.  Coronary spasm.  Patient with continued pain.  Sl bump in troponin yesterday now trickling down.  WIll continue meds .  Trial of IV MgSO4 2G Continue heparin, amlodipine, imdur, statin Clear liquid breakfast for now.    LOS: 2 days   Dorris Carnes 09/23/2013, 7:28 AM

## 2013-09-23 NOTE — ED Provider Notes (Signed)
Medical screening examination/treatment/procedure(s) were performed by non-physician practitioner and as supervising physician I was immediately available for consultation/collaboration.      Alfonzo Feller, DO 09/23/13 [phone]

## 2013-09-23 NOTE — Progress Notes (Signed)
Case reviewed with colleagues. With history of spasm and rel recent cath would continue medical Rx.  Would not perform LHC unless enzymes rise significantly.

## 2013-09-23 NOTE — Progress Notes (Addendum)
ANTICOAGULATION CONSULT NOTE - Follow Up Consult  Pharmacy Consult for Heparin Indication: chest pain/ACS  No Known Allergies  Patient Measurements: Height: 5\' 4"  (162.6 cm) Weight: 136 lb 8 oz (61.916 kg) IBW/kg (Calculated) : 54.7 Heparin Dosing Weight: 59kg  Vital Signs: Temp: 98 F (36.7 C) (02/13 1318) Temp src: Oral (02/13 1318) BP: 96/44 mmHg (02/13 1318) Pulse Rate: 58 (02/13 1318)  Labs:  Recent Labs  09/21/13 0927  09/21/13 1920  09/22/13 0425  09/22/13 1305 09/22/13 1530 09/22/13 1950 09/22/13 2000 09/23/13 0520 09/23/13 1300  HGB 14.7  --  12.6  --  12.3  --   --   --   --   --  12.0  --   HCT 41.8  --  36.7  --  35.9*  --   --   --   --   --  35.9*  --   PLT 315  --  246  --  252  --   --   --   --   --  225  --   LABPROT  --   --  12.2  --   --   --   --   --   --   --   --   --   INR  --   --  0.92  --   --   --   --   --   --   --   --   --   HEPARINUNFRC  --   --   --   --   --   < >  --  0.37  --   --  0.27* 0.18*  CREATININE 0.53  --  0.71  --  0.80  --   --   --   --   --   --   --   CKTOTAL  --   --   --   --   --   --   --   --  96  --   --   --   CKMB  --   --   --   --   --   --   --   --  4.2*  --   --   --   TROPONINI 0.31*  < > 2.30*  < > 0.76*  --  0.66*  --   --  1.08* 0.52*  --   < > = values in this interval not displayed.  Estimated Creatinine Clearance: 58.9 ml/min (by C-G formula based on Cr of 0.8).   Assessment: 6 hour heparin level = 0.44 on 900 units/hr. Therapeutic heparin level in this 67yof continuing on heparin for Chest pain/ACS. No bleeding noted.  Goal of Therapy:  Heparin level 0.3-0.7 units/ml Monitor platelets by anticoagulation protocol: Yes   Plan:  Continue IV heparin 900 units/hr (9 ml/hr) -Daily HL/CBC  Nicole Cella, RPh Clinical Pharmacist Pager: [phone] 09/23/2013. 2:03 PM

## 2013-09-24 DIAGNOSIS — R9431 Abnormal electrocardiogram [ECG] [EKG]: Secondary | ICD-10-CM

## 2013-09-24 DIAGNOSIS — R799 Abnormal finding of blood chemistry, unspecified: Secondary | ICD-10-CM

## 2013-09-24 DIAGNOSIS — I201 Angina pectoris with documented spasm: Secondary | ICD-10-CM

## 2013-09-24 LAB — CBC
HCT: 35.8 % — ABNORMAL LOW (ref 36.0–46.0)
Hemoglobin: 12.2 g/dL (ref 12.0–15.0)
MCH: 32.3 pg (ref 26.0–34.0)
MCHC: 34.1 g/dL (ref 30.0–36.0)
MCV: 94.7 fL (ref 78.0–100.0)
Platelets: 238 10*3/uL (ref 150–400)
RBC: 3.78 MIL/uL — ABNORMAL LOW (ref 3.87–5.11)
RDW: 14 % (ref 11.5–15.5)
WBC: 6.3 10*3/uL (ref 4.0–10.5)

## 2013-09-24 LAB — HEPARIN LEVEL (UNFRACTIONATED): Heparin Unfractionated: 0.47 IU/mL (ref 0.30–0.70)

## 2013-09-24 LAB — TROPONIN I: Troponin I: 0.48 ng/mL (ref <0.30)

## 2013-09-24 MED ORDER — RANOLAZINE ER 500 MG PO TB12
1000.0000 mg | ORAL_TABLET | Freq: Two times a day (BID) | ORAL | Status: DC
  Administered 2013-09-24 – 2013-09-27 (×6): 1000 mg via ORAL
  Filled 2013-09-24 (×7): qty 2

## 2013-09-24 MED ORDER — RANOLAZINE ER 500 MG PO TB12
500.0000 mg | ORAL_TABLET | Freq: Once | ORAL | Status: AC
  Administered 2013-09-24: 500 mg via ORAL
  Filled 2013-09-24: qty 1

## 2013-09-24 NOTE — Progress Notes (Signed)
ANTICOAGULATION CONSULT NOTE - Follow Up Consult  Pharmacy Consult for Heparin Indication: chest pain/ACS  No Known Allergies  Patient Measurements: Height: 5\' 4"  (162.6 cm) Weight: 138 lb 4.8 oz (62.732 kg) IBW/kg (Calculated) : 54.7 Heparin Dosing Weight: 58 kg  Vital Signs: Temp: 98.4 F (36.9 C) (02/14 0552) Temp src: Oral (02/14 0552) BP: 117/58 mmHg (02/14 0552) Pulse Rate: 65 (02/14 0552)  Labs:  Recent Labs  09/21/13 1920  09/22/13 0425  09/22/13 1950 09/22/13 2000 09/23/13 0520 09/23/13 1300 09/23/13 2156 09/24/13 0440  HGB 12.6  --  12.3  --   --   --  12.0  --   --  12.2  HCT 36.7  --  35.9*  --   --   --  35.9*  --   --  35.8*  PLT 246  --  252  --   --   --  225  --   --  238  LABPROT 12.2  --   --   --   --   --   --   --   --   --   INR 0.92  --   --   --   --   --   --   --   --   --   HEPARINUNFRC  --   --   --   < >  --   --  0.27* 0.18* 0.44 0.47  CREATININE 0.71  --  0.80  --   --   --   --   --   --   --   CKTOTAL  --   --   --   --  96  --   --   --   --   --   CKMB  --   --   --   --  4.2*  --   --   --   --   --   TROPONINI 2.30*  < > 0.76*  < >  --  1.08* 0.52*  --   --  0.48*  < > = values in this interval not displayed.  Estimated Creatinine Clearance: 58.9 ml/min (by C-G formula based on Cr of 0.8).   Medications:  Scheduled:  . amLODipine  5 mg Oral Daily  . aspirin  81 mg Oral Daily  . atorvastatin  20 mg Oral q1800  . calcium-vitamin D  1 tablet Oral BID  . citalopram  20 mg Oral Daily  . famotidine  20 mg Oral Daily  . ferrous sulfate  325 mg Oral Q breakfast  . isosorbide mononitrate  120 mg Oral Daily  . pantoprazole  40 mg Oral Daily  . ranolazine  1,000 mg Oral BID  . sodium chloride  3 mL Intravenous Q12H   Infusions:  . heparin 900 Units/hr (09/23/13 1437)    Assessment: 68 yo F with hx CAD presented to the ED with chest pain.  Troponin elevated but trending down.  Cardiology debating cath vs. med mgmt.  Although  patient is currently on max med mgmt with Amlodipine, Ranexa, and Imdur.  Heparin level is therapeutic on 900 units/hr.    Goal of Therapy:  Heparin level 0.3-0.7 units/ml Monitor platelets by anticoagulation protocol: Yes   Plan:  Continue heparin at 900 units/hr. Heparin level and CBC daily Follow up plans per cardiology.  [employer], Pharm.D., BCPS Clinical Pharmacist Pager [phone] 09/24/2013 12:36 PM

## 2013-09-24 NOTE — Progress Notes (Signed)
The patient was seen and examined, and I agree with the assessment and plan as documented above. Continues to have episodic chest pain, with documented history of coronary vasospasm. Already on Imdur 120 mg daily, Ranexa 500 mg bid, and amlodipine 5 mg daily. Troponins peaked at 2.30, now down to 0.48. Given soft BP, will only be able to increase Ranexa to 1000 mg bid. Remains on heparin, ASA, and Lipitor. Will reassess on 2/15. Hold off on cath for now.

## 2013-09-24 NOTE — Progress Notes (Signed)
Subjective: Continues to have CP.  It has never lasted this long before.  Objective: Vital signs in last 24 hours: Temp:  [98 F (36.7 C)-98.6 F (37 C)] 98.4 F (36.9 C) (02/14 0552) Pulse Rate:  [54-65] 65 (02/14 0552) Resp:  [18] 18 (02/14 0552) BP: (94-117)/(44-58) 117/58 mmHg (02/14 0552) SpO2:  [93 %-96 %] 94 % (02/14 0552) Weight:  [138 lb 4.8 oz (62.732 kg)] 138 lb 4.8 oz (62.732 kg) (02/14 0552) Last BM Date: 09/21/13  Intake/Output from previous day:   Intake/Output this shift:    Medications Current Facility-Administered Medications  Medication Dose Route Frequency Provider Last Rate Last Dose  . 0.9 %  sodium chloride infusion  250 mL Intravenous PRN Perry Mount, PA-C 10 mL/hr at 09/22/13 1313 250 mL at 09/22/13 1313  . alum & mag hydroxide-simeth (MAALOX/MYLANTA) 200-200-20 MG/5ML suspension 30 mL  30 mL Oral TID PRN Minus Breeding, MD   30 mL at 09/22/13 1112  . amLODipine (NORVASC) tablet 5 mg  5 mg Oral Daily Perry Mount, PA-C   5 mg at 09/24/13 2595  . aspirin chewable tablet 81 mg  81 mg Oral Daily Perry Mount, PA-C   81 mg at 09/24/13 0941  . atorvastatin (LIPITOR) tablet 20 mg  20 mg Oral q1800 Minus Breeding, MD   20 mg at 09/23/13 1850  . calcium-vitamin D (OSCAL WITH D) 500-200 MG-UNIT per tablet 1 tablet  1 tablet Oral BID Perry Mount, PA-C   1 tablet at 09/24/13 0941  . citalopram (CELEXA) tablet 20 mg  20 mg Oral Daily Perry Mount, PA-C   20 mg at 09/24/13 6387  . clonazePAM (KLONOPIN) tablet 0.5 mg  0.5 mg Oral BID PRN Perry Mount, PA-C      . famotidine (PEPCID) tablet 20 mg  20 mg Oral Daily Perry Mount, PA-C   20 mg at 09/24/13 5643  . ferrous sulfate tablet 325 mg  325 mg Oral Q breakfast Perry Mount, PA-C   325 mg at 09/24/13 0827  . gi cocktail (Maalox,Lidocaine,Donnatal)  30 mL Oral BID PRN Erlene Quan, PA-C   30 mL at 09/24/13 0943  . heparin ADULT infusion 100 units/mL (25000 units/250 mL)  900 Units/hr Intravenous  Continuous Minus Breeding, MD 9 mL/hr at 09/23/13 1437 900 Units/hr at 09/23/13 1437  . HYDROmorphone (DILAUDID) injection 1 mg  1 mg Intravenous Q4H PRN Fay Records, MD   1 mg at 09/24/13 0943  . isosorbide mononitrate (IMDUR) 24 hr tablet 120 mg  120 mg Oral Daily Perry Mount, PA-C   120 mg at 09/24/13 3295  . nitroGLYCERIN (NITROSTAT) SL tablet 0.4 mg  0.4 mg Sublingual Q5 min PRN Perry Mount, PA-C      . pantoprazole (PROTONIX) EC tablet 40 mg  40 mg Oral Daily Perry Mount, PA-C   40 mg at 09/24/13 0941  . ranolazine (RANEXA) 12 hr tablet 500 mg  500 mg Oral BID Perry Mount, PA-C   500 mg at 09/24/13 1884  . sodium chloride 0.9 % injection 3 mL  3 mL Intravenous Q12H Perry Mount, PA-C   3 mL at 09/24/13 0943  . sodium chloride 0.9 % injection 3 mL  3 mL Intravenous PRN Perry Mount, PA-C        PE: General appearance: alert, cooperative and no distress Lungs: clear to auscultation bilaterally Heart: regular rate and rhythm, S1, S2 normal, no murmur, click, rub or gallop Extremities: No LEE Pulses: 2+ and symmetric Skin:  Warm and dry Neurologic: Grossly normal  Lab Results:   Recent Labs  09/22/13 0425 09/23/13 0520 09/24/13 0440  WBC 5.5 5.5 6.3  HGB 12.3 12.0 12.2  HCT 35.9* 35.9* 35.8*  PLT 252 225 238   BMET  Recent Labs  09/21/13 1049 09/21/13 1920 09/22/13 0425  NA  --   --  142  K 4.9  --  5.0  CL  --   --  103  CO2  --   --  26  GLUCOSE  --   --  114*  BUN  --   --  15  CREATININE  --  0.71 0.80  CALCIUM  --   --  9.0   PT/INR  Recent Labs  09/21/13 1920  LABPROT 12.2  INR 0.92   Cholesterol  Recent Labs  09/22/13 0425  CHOL 189   Lipid Panel     Component Value Date/Time   CHOL 189 09/22/2013 0425   TRIG 62 09/22/2013 0425   HDL 90 09/22/2013 0425   CHOLHDL 2.1 09/22/2013 0425   VLDL 12 09/22/2013 0425   LDLCALC 87 09/22/2013 0425    Cardiac Panel (last 3 results)  Recent Labs  09/22/13 1950 09/22/13 2000 09/23/13 0520  09/24/13 0440  CKTOTAL 96  --   --   --   CKMB 4.2*  --   --   --   TROPONINI  --  1.08* 0.52* 0.48*  RELINDX RELATIVE INDEX IS INVALID  --   --   --     Assessment/Plan   Active Problems:   Atypical chest pain   HTN (hypertension)   GERD (gastroesophageal reflux disease)   Depression   Dyslipidemia   Chest pain  Plan:  68 yo with history of CAD s/p remote stenting to RCA and PTCA of LAD, GERD, HLD, HTN and anxiety who presented to ED today with chest pain. She has had a history of atypical chest pain of uncertain etiology thought to be perhaps GI versus coronary vasospasm.  ECHO 02/2013 revealed low normal EF 50- 55% with moderate hypokinesis of entire inferoposterior myocardium, grade 2 diastolic dysfunction and mild MR.  Last cath 02/21/13: 1. Generalized left coronary vasoconstriction relieved with intracoronary nitroglycerin  2. 40% proximal LAD an eccentric 50% diagonal #1.  3. Widely patent circumflex and RCA including the RCA stent  4. Low normal LV systolic function   She continues to have CP which eases off at times.  Troponin peaked at 2.30 went down to 0.66 and back up to 1.08.  Last was 0.48 today.   Amloidipine 5mg , imdur 120 and ranexa 500bid.  We can try increasing amlodipine and ranexa, however, BP is a little soft and may not tolerate additional amlodipine.  On IV heaprin.  May need cath.  Lipids look great. On lipitor   LOS: 3 days    Dajia Gunnels, [place] 09/24/2013 10:08 AM

## 2013-09-25 DIAGNOSIS — I2510 Atherosclerotic heart disease of native coronary artery without angina pectoris: Secondary | ICD-10-CM

## 2013-09-25 DIAGNOSIS — I472 Ventricular tachycardia: Secondary | ICD-10-CM

## 2013-09-25 DIAGNOSIS — I4729 Other ventricular tachycardia: Secondary | ICD-10-CM

## 2013-09-25 LAB — CBC
HCT: 38.4 % (ref 36.0–46.0)
Hemoglobin: 13.4 g/dL (ref 12.0–15.0)
MCH: 32.8 pg (ref 26.0–34.0)
MCHC: 34.9 g/dL (ref 30.0–36.0)
MCV: 93.9 fL (ref 78.0–100.0)
Platelets: 275 K/uL (ref 150–400)
RBC: 4.09 MIL/uL (ref 3.87–5.11)
RDW: 13.7 % (ref 11.5–15.5)
WBC: 7 K/uL (ref 4.0–10.5)

## 2013-09-25 LAB — HEPARIN LEVEL (UNFRACTIONATED): Heparin Unfractionated: 0.51 IU/mL (ref 0.30–0.70)

## 2013-09-25 NOTE — Progress Notes (Signed)
The patient was seen and examined, and I agree with the assessment and plan as documented above.  Continues to have episodic chest pain, albeit less so with increased dose of Ranexa 1000 mg bid on 2/14. Has documented history of coronary vasospasm.  Troponins peaked at 2.30, trended down to 0.48. Given soft BP, will be unable to add beta blocker. Will plan to ambulate today to monitor symptoms during exertion.   Will reassess on 2/16. Hold off on cath for now.

## 2013-09-25 NOTE — Progress Notes (Signed)
ANTICOAGULATION CONSULT NOTE - Follow Up Consult  Pharmacy Consult for Heparin Indication: chest pain/ACS  No Known Allergies  Patient Measurements: Height: 5\' 4"  (162.6 cm) Weight: 135 lb 9.6 oz (61.508 kg) IBW/kg (Calculated) : 54.7 Heparin Dosing Weight: 58 kg  Vital Signs: Temp: 97.5 F (36.4 C) (02/15 0536) Temp src: Oral (02/15 0536) BP: 112/65 mmHg (02/15 0536) Pulse Rate: 68 (02/15 0536)  Labs:  Recent Labs  09/22/13 1950 09/22/13 2000  09/23/13 0520  09/23/13 2156 09/24/13 0440 09/25/13 0500  HGB  --   --   < > 12.0  --   --  12.2 13.4  HCT  --   --   --  35.9*  --   --  35.8* 38.4  PLT  --   --   --  225  --   --  238 275  HEPARINUNFRC  --   --   --  0.27*  < > 0.44 0.47 0.51  CKTOTAL 96  --   --   --   --   --   --   --   CKMB 4.2*  --   --   --   --   --   --   --   TROPONINI  --  1.08*  --  0.52*  --   --  0.48*  --   < > = values in this interval not displayed.  Estimated Creatinine Clearance: 58.9 ml/min (by C-G formula based on Cr of 0.8).   Medications:  Scheduled:  . amLODipine  5 mg Oral Daily  . aspirin  81 mg Oral Daily  . atorvastatin  20 mg Oral q1800  . calcium-vitamin D  1 tablet Oral BID  . citalopram  20 mg Oral Daily  . famotidine  20 mg Oral Daily  . ferrous sulfate  325 mg Oral Q breakfast  . isosorbide mononitrate  120 mg Oral Daily  . pantoprazole  40 mg Oral Daily  . ranolazine  1,000 mg Oral BID  . sodium chloride  3 mL Intravenous Q12H   Infusions:  . heparin 900 Units/hr (09/23/13 1437)    Assessment: 68 yo F with hx CAD presented to the ED with chest pain.  Troponin elevated but trending down.  Cardiology debating cath vs. med mgmt.  Although patient is currently on max med mgmt with Amlodipine, Ranexa, and Imdur.  Heparin level is therapeutic on 900 units/hr.    Goal of Therapy:  Heparin level 0.3-0.7 units/ml Monitor platelets by anticoagulation protocol: Yes   Plan:  Continue heparin at 900 units/hr. Heparin  level and CBC daily Follow up plans per cardiology. Last BMET was 2/11 with K=5.  I have reordered one for the AM to follow-up K, SCr.  Horton Chin, Pharm.D., BCPS Clinical Pharmacist Pager [phone] 09/25/2013 8:58 AM

## 2013-09-25 NOTE — Progress Notes (Signed)
Subjective: Still complaining of "cramping" chest pain.  It seems to have decreased after increase ranexa.  Objective: Vital signs in last 24 hours: Temp:  [97.5 F (36.4 C)-98.8 F (37.1 C)] 97.5 F (36.4 C) (02/15 0536) Pulse Rate:  [56-68] 65 (02/15 0924) Resp:  [16-20] 19 (02/15 0536) BP: (93-112)/(48-65) 110/55 mmHg (02/15 0924) SpO2:  [92 %-96 %] 95 % (02/15 0536) Weight:  [135 lb 9.6 oz (61.508 kg)] 135 lb 9.6 oz (61.508 kg) (02/15 0700) Last BM Date: 09/21/13  Intake/Output from previous day: 02/14 0701 - 02/15 0700 In: 900 [P.O.:900] Out: -  Intake/Output this shift: Total I/O In: 480 [P.O.:480] Out: -   Medications Current Facility-Administered Medications  Medication Dose Route Frequency Provider Last Rate Last Dose  . 0.9 %  sodium chloride infusion  250 mL Intravenous PRN Perry Mount, PA-C 10 mL/hr at 09/22/13 1313 250 mL at 09/22/13 1313  . alum & mag hydroxide-simeth (MAALOX/MYLANTA) 200-200-20 MG/5ML suspension 30 mL  30 mL Oral TID PRN Minus Breeding, MD   30 mL at 09/22/13 1112  . amLODipine (NORVASC) tablet 5 mg  5 mg Oral Daily Perry Mount, PA-C   5 mg at 09/25/13 9518  . aspirin chewable tablet 81 mg  81 mg Oral Daily Perry Mount, PA-C   81 mg at 09/25/13 8416  . atorvastatin (LIPITOR) tablet 20 mg  20 mg Oral q1800 Minus Breeding, MD   20 mg at 09/24/13 1719  . calcium-vitamin D (OSCAL WITH D) 500-200 MG-UNIT per tablet 1 tablet  1 tablet Oral BID Perry Mount, PA-C   1 tablet at 09/25/13 6063  . citalopram (CELEXA) tablet 20 mg  20 mg Oral Daily Perry Mount, PA-C   20 mg at 09/25/13 0160  . clonazePAM (KLONOPIN) tablet 0.5 mg  0.5 mg Oral BID PRN Perry Mount, PA-C      . famotidine (PEPCID) tablet 20 mg  20 mg Oral Daily Perry Mount, PA-C   20 mg at 09/25/13 0926  . ferrous sulfate tablet 325 mg  325 mg Oral Q breakfast Perry Mount, PA-C   325 mg at 09/25/13 0756  . gi cocktail (Maalox,Lidocaine,Donnatal)  30 mL Oral BID PRN Erlene Quan, PA-C   30 mL at 09/24/13 0943  . heparin ADULT infusion 100 units/mL (25000 units/250 mL)  900 Units/hr Intravenous Continuous Minus Breeding, MD 9 mL/hr at 09/23/13 1437 900 Units/hr at 09/23/13 1437  . HYDROmorphone (DILAUDID) injection 1 mg  1 mg Intravenous Q4H PRN Fay Records, MD   1 mg at 09/25/13 0934  . isosorbide mononitrate (IMDUR) 24 hr tablet 120 mg  120 mg Oral Daily Perry Mount, PA-C   120 mg at 09/25/13 1093  . nitroGLYCERIN (NITROSTAT) SL tablet 0.4 mg  0.4 mg Sublingual Q5 min PRN Perry Mount, PA-C      . pantoprazole (PROTONIX) EC tablet 40 mg  40 mg Oral Daily Perry Mount, PA-C   40 mg at 09/25/13 0926  . ranolazine (RANEXA) 12 hr tablet 1,000 mg  1,000 mg Oral BID Tarri Fuller, PA-C   1,000 mg at 09/25/13 2355  . sodium chloride 0.9 % injection 3 mL  3 mL Intravenous Q12H Perry Mount, PA-C   3 mL at 09/25/13 0934  . sodium chloride 0.9 % injection 3 mL  3 mL Intravenous PRN Perry Mount, PA-C        PE: General appearance: alert, cooperative and no distress  Lungs: clear to auscultation bilaterally  Heart: regular  rate and rhythm, S1, S2 normal, no murmur, click, rub or gallop  Extremities: No LEE  Pulses: 2+ and symmetric  Skin: Warm and dry  Neurologic: Grossly normal  Lab Results:   Recent Labs  09/23/13 0520 09/24/13 0440 09/25/13 0500  WBC 5.5 6.3 7.0  HGB 12.0 12.2 13.4  HCT 35.9* 35.8* 38.4  PLT 225 238 275    Assessment/Plan   Active Problems:   Atypical chest pain   HTN (hypertension)   GERD (gastroesophageal reflux disease)   Depression   Dyslipidemia   Chest pain  Plan:  Troponin peaked at 2.30 went down to 0.66 and back up to 1.08. Last was 0.48 yesterday. Amloidipine 5mg , imdur 120. We increased ranexa to 1000bid yesterday.   On IV heaprin. Still having cramping CP.  Will ambulate today and see how the symptoms are.  Will increase protonix to BID.   She had a 5 beat run of NSVT.  magnesium was 2.1 a few days ago.  BP will  not tolerate added beta blocker.  Lipids look great. On lipitor  Bp well controlled.      LOS: 4 days    [name] 09/25/2013 10:06 AM

## 2013-09-26 ENCOUNTER — Encounter (HOSPITAL_COMMUNITY): Payer: Self-pay | Admitting: Cardiology

## 2013-09-26 DIAGNOSIS — I209 Angina pectoris, unspecified: Secondary | ICD-10-CM

## 2013-09-26 DIAGNOSIS — I472 Ventricular tachycardia: Secondary | ICD-10-CM

## 2013-09-26 DIAGNOSIS — I201 Angina pectoris with documented spasm: Secondary | ICD-10-CM | POA: Diagnosis present

## 2013-09-26 DIAGNOSIS — I4729 Other ventricular tachycardia: Secondary | ICD-10-CM

## 2013-09-26 HISTORY — DX: Other ventricular tachycardia: I47.29

## 2013-09-26 HISTORY — DX: Angina pectoris with documented spasm: I20.1

## 2013-09-26 HISTORY — DX: Ventricular tachycardia: I47.2

## 2013-09-26 LAB — BASIC METABOLIC PANEL
BUN: 10 mg/dL (ref 6–23)
CO2: 28 mEq/L (ref 19–32)
Calcium: 9.8 mg/dL (ref 8.4–10.5)
Chloride: 100 mEq/L (ref 96–112)
Creatinine, Ser: 0.76 mg/dL (ref 0.50–1.10)
GFR calc Af Amer: >90 mL/min (ref >90)
GFR calc non Af Amer: 85 mL/min — ABNORMAL LOW (ref >90)
Glucose, Bld: 111 mg/dL — ABNORMAL HIGH (ref 70–99)
Potassium: 4.1 mEq/L (ref 3.7–5.3)
Sodium: 137 mEq/L (ref 137–147)

## 2013-09-26 LAB — CBC
HCT: 35.4 % — ABNORMAL LOW (ref 36.0–46.0)
Hemoglobin: 12.3 g/dL (ref 12.0–15.0)
MCH: 32.7 pg (ref 26.0–34.0)
MCHC: 34.7 g/dL (ref 30.0–36.0)
MCV: 94.1 fL (ref 78.0–100.0)
Platelets: 242 10*3/uL (ref 150–400)
RBC: 3.76 MIL/uL — ABNORMAL LOW (ref 3.87–5.11)
RDW: 14 % (ref 11.5–15.5)
WBC: 5.5 10*3/uL (ref 4.0–10.5)

## 2013-09-26 LAB — HEPARIN LEVEL (UNFRACTIONATED): Heparin Unfractionated: 0.67 IU/mL (ref 0.30–0.70)

## 2013-09-26 NOTE — Progress Notes (Signed)
         Subjective: occ mild chest pain, much improved.  + lightheaded with walking in the hall last pm (BP dropped to 89/43)  Objective: Vital signs in last 24 hours: Temp:  [97.7 F (36.5 C)-98.2 F (36.8 C)] 98.2 F (36.8 C) (02/16 0500) Pulse Rate:  [57-65] 57 (02/16 0500) Resp:  [14-16] 16 (02/16 0500) BP: (89-110)/(43-61) 104/61 mmHg (02/16 0500) SpO2:  [93 %-97 %] 93 % (02/16 0500) Weight change:  Last BM Date: 09/25/13 Intake/Output from previous day:  +480 02/15 0701 - 02/16 0700 In: 480 [P.O.:480] Out: -  Intake/Output this shift: Total I/O In: 360 [P.O.:360] Out: -   PE: General:Pleasant affect, NAD Skin:Warm and dry, brisk capillary refill HEENT:normocephalic, sclera clear, mucus membranes moist  Heart:S1S2 RRR without murmur, gallup, rub or click Lungs:clear without rales, rhonchi, or wheezes ZOX:WRUE, non tender, + BS, do not palpate liver spleen or masses Ext:no lower ext edema, 2+ pedal pulses, 2+ radial pulses Neuro:alert and oriented, MAE, follows commands, + facial symmetry   Lab Results:  Recent Labs  09/25/13 0500 09/26/13 0555  WBC 7.0 5.5  HGB 13.4 12.3  HCT 38.4 35.4*  PLT 275 242   BMET  Recent Labs  09/26/13 0555  NA 137  K 4.1  CL 100  CO2 28  GLUCOSE 111*  BUN 10  CREATININE 0.76  CALCIUM 9.8    Recent Labs  09/24/13 0440  TROPONINI 0.48*    Lab Results  Component Value Date   CHOL 189 09/22/2013   HDL 90 09/22/2013   LDLCALC 87 09/22/2013   TRIG 62 09/22/2013   CHOLHDL 2.1 09/22/2013   Lab Results  Component Value Date   HGBA1C 6.0* 09/21/2013     Lab Results  Component Value Date   TSH 1.760 09/21/2013       Studies/Results: No results found.  Medications: I have reviewed the patient's current medications. Scheduled Meds: . amLODipine  5 mg Oral Daily  . aspirin  81 mg Oral Daily  . atorvastatin  20 mg Oral q1800  . calcium-vitamin D  1 tablet Oral BID  . citalopram  20 mg Oral Daily  .  famotidine  20 mg Oral Daily  . ferrous sulfate  325 mg Oral Q breakfast  . isosorbide mononitrate  120 mg Oral Daily  . pantoprazole  40 mg Oral Daily  . ranolazine  1,000 mg Oral BID  . sodium chloride  3 mL Intravenous Q12H   Continuous Infusions: . heparin 900 Units/hr (09/23/13 1437)   PRN Meds:.sodium chloride, alum & mag hydroxide-simeth, clonazePAM, gi cocktail, HYDROmorphone (DILAUDID) injection, nitroGLYCERIN, sodium chloride  Assessment/Plan: Principal Problem:   Acute coronary syndrome Active Problems:   HTN (hypertension)   GERD (gastroesophageal reflux disease)   Depression   Dyslipidemia   NSVT (nonsustained ventricular tachycardia)   Coronary artery spasm, wth continued episodes of chest pain.   PLAN: continues with episodes of chest pain, with troponin bump with significant episodes.  Last troponin elevation on Sat.  Pain improving, mild decreased BP with ambulation, stop Heparin and see how she does.  LOS: 5 days   Time spent with pt. :15 minutes. [facility] R  Nurse Practitioner Certified Pager [phone] or after 5pm and on weekends call [phone] 09/26/2013, 8:58 AM   I have examined the patient and reviewed assessment and plan and discussed with patient.  Agree with above as stated.  Maximize vasodilators as BP tolerates.  Ambulate.  Zaeda Mcferran S.

## 2013-09-26 NOTE — Progress Notes (Signed)
CARDIAC REHAB PHASE I   PRE:  Rate/Rhythm: 68 SR    BP: sitting 104/56, standing 94/60    SaO2:   MODE:  Ambulation: 400 ft   POST:  Rate/Rhythm: 80 SR    BP: sitting 105/56     SaO2:   Tolerated fairly well. Denied CP. Pt c/o seeing stars for last 100 ft of walking. Resolved with rest. No overt dizziness. C/o weak legs. To chair. Reviewed heart healthy living ed. Understands NTG. Encouraged more walking today with staff or family.   [phone]  Josephina Shih Stewart CES, ACSM 09/26/2013 11:01 AM

## 2013-09-27 ENCOUNTER — Encounter (HOSPITAL_COMMUNITY): Payer: Self-pay | Admitting: Physician Assistant

## 2013-09-27 DIAGNOSIS — I214 Non-ST elevation (NSTEMI) myocardial infarction: Secondary | ICD-10-CM

## 2013-09-27 DIAGNOSIS — I252 Old myocardial infarction: Secondary | ICD-10-CM | POA: Diagnosis present

## 2013-09-27 LAB — BASIC METABOLIC PANEL
BUN: 9 mg/dL (ref 6–23)
CO2: 25 mEq/L (ref 19–32)
Calcium: 9.9 mg/dL (ref 8.4–10.5)
Chloride: 101 mEq/L (ref 96–112)
Creatinine, Ser: 0.75 mg/dL (ref 0.50–1.10)
GFR calc Af Amer: >90 mL/min (ref >90)
GFR calc non Af Amer: 86 mL/min — ABNORMAL LOW (ref >90)
Glucose, Bld: 123 mg/dL — ABNORMAL HIGH (ref 70–99)
Potassium: 4.3 mEq/L (ref 3.7–5.3)
Sodium: 140 mEq/L (ref 137–147)

## 2013-09-27 LAB — CBC
HCT: 35.9 % — ABNORMAL LOW (ref 36.0–46.0)
Hemoglobin: 12.4 g/dL (ref 12.0–15.0)
MCH: 32.5 pg (ref 26.0–34.0)
MCHC: 34.5 g/dL (ref 30.0–36.0)
MCV: 94 fL (ref 78.0–100.0)
Platelets: 263 10*3/uL (ref 150–400)
RBC: 3.82 MIL/uL — ABNORMAL LOW (ref 3.87–5.11)
RDW: 14 % (ref 11.5–15.5)
WBC: 5.1 10*3/uL (ref 4.0–10.5)

## 2013-09-27 MED ORDER — RANOLAZINE ER 1000 MG PO TB12: 1000.0000 mg | ORAL_TABLET | Freq: Two times a day (BID) | ORAL | Status: DC

## 2013-09-27 NOTE — Progress Notes (Signed)
         Subjective: No chest pain, much improved.  + lightheaded with walking , but resolves quickly. No CP with cardiac rehab.  Right hand pain.  Objective: Vital signs in last 24 hours: Temp:  [98.2 F (36.8 C)-98.7 F (37.1 C)] 98.5 F (36.9 C) (02/17 0300) Pulse Rate:  [64-71] 64 (02/17 0300) Resp:  [16-20] 20 (02/17 0300) BP: (94-110)/(48-63) 110/59 mmHg (02/17 0300) SpO2:  [93 %-96 %] 96 % (02/16 2016) Weight:  [124 lb 1.9 oz (56.3 kg)-125 lb 10.6 oz (57 kg)] 125 lb 10.6 oz (57 kg) (02/17 0516) Weight change:  Last BM Date: 09/26/13 Intake/Output from previous day:  +480 02/16 0701 - 02/17 0700 In: 700 [P.O.:700] Out: -  Intake/Output this shift:    PE: General:Pleasant affect, NAD Skin:no rash HEENT:normocephalic, sclera clear, mucus membranes moist  Heart:S1S2 RRR without murmur, gallup, rub or click Lungs:clear without rales, rhonchi, or wheezes GMW:NUUV, non tender,  Ext:no lower ext edema, 2+ radial pulses Neuro:alert and oriented, MAE, follows commands, + facial symmetry   Lab Results:  Recent Labs  09/26/13 0555 09/27/13 0440  WBC 5.5 5.1  HGB 12.3 12.4  HCT 35.4* 35.9*  PLT 242 263   BMET  Recent Labs  09/26/13 0555 09/27/13 0440  NA 137 140  K 4.1 4.3  CL 100 101  CO2 28 25  GLUCOSE 111* 123*  BUN 10 9  CREATININE 0.76 0.75  CALCIUM 9.8 9.9   No results found for this basename: TROPONINI, CK, MB,  in the last 72 hours  Lab Results  Component Value Date   CHOL 189 09/22/2013   HDL 90 09/22/2013   LDLCALC 87 09/22/2013   TRIG 62 09/22/2013   CHOLHDL 2.1 09/22/2013   Lab Results  Component Value Date   HGBA1C 6.0* 09/21/2013     Lab Results  Component Value Date   TSH 1.760 09/21/2013       Studies/Results: No results found.  Medications: I have reviewed the patient's current medications. Scheduled Meds: . amLODipine  5 mg Oral Daily  . aspirin  81 mg Oral Daily  . atorvastatin  20 mg Oral q1800  . calcium-vitamin D   1 tablet Oral BID  . citalopram  20 mg Oral Daily  . famotidine  20 mg Oral Daily  . ferrous sulfate  325 mg Oral Q breakfast  . isosorbide mononitrate  120 mg Oral Daily  . pantoprazole  40 mg Oral Daily  . ranolazine  1,000 mg Oral BID  . sodium chloride  3 mL Intravenous Q12H   Continuous Infusions:   PRN Meds:.sodium chloride, alum & mag hydroxide-simeth, clonazePAM, gi cocktail, HYDROmorphone (DILAUDID) injection, nitroGLYCERIN, sodium chloride  Assessment/Plan: Principal Problem:   Acute coronary syndrome Active Problems:   HTN (hypertension)   GERD (gastroesophageal reflux disease)   Depression   Dyslipidemia   NSVT (nonsustained ventricular tachycardia)   Coronary artery spasm, wth continued episodes of chest pain.   PLAN:   Vasospasm. Stable on current regimen.  No CP. OK for d/c today.  See full d/c summary. Ranexa was increased.  Maximize vasodilators as BP tolerates.    Jillian Damon S.

## 2013-09-27 NOTE — Progress Notes (Signed)
CARDIAC REHAB PHASE I   PRE:  Rate/Rhythm: 74 SR  BP:  Supine: 97/62  Sitting:   Standing:    SaO2: 97 RA  MODE:  Ambulation: 550 ft   POST:  Rate/Rhythm: 91  BP:  Supine:   Sitting: 114/76  Standing:    SaO2: 98 RA 1000-1030 Pt tolerated ambulation well without c/o of cp or SOB. She denies any dizziness with walking. VS stable Pt to side of bed after walk.  Rodney Langton RN 09/27/2013 10:39 AM

## 2013-09-27 NOTE — Progress Notes (Signed)
D/c orders received;IV removed with gauze on, pt remains in stable condition, pt meds and instructions reviewed and given to pt; pt d/c to home 

## 2013-09-27 NOTE — Discharge Summary (Signed)
Discharge Summary   Patient ID: [name] MRN: [id number], DOB/AGE: Sep 28, 1945 68 y.o. Admit date: 09/21/2013 D/C date:     09/27/2013  Primary Cardiologist: Dr. Tamala Julian   Active Problems:   NSTEMI (non-ST elevated myocardial infarction)- thought to be caused by coronary vasospasm    Atypical chest pain   Coronary artery spasm, wth continued episodes of chest pain.    HTN (hypertension)   GERD (gastroesophageal reflux disease)   Depression   Dyslipidemia   Acute coronary syndrome   NSVT (nonsustained ventricular tachycardia)  Discharge diagnosis- NSTEMI 2/2 coronary vasospasm  Hospital Course: 68 yo with history of CAD s/p remote stenting to RCA and PTCA of LAD, GERD, HLD, HTN and anxiety who presented to [facility] ED on 09/21/13 with chest pain and NSTEMI. She has a history of atypical chest pain of uncertain etiology thought to be GI versus coronary vasospasm. Her outpatient medications include NTG, amloidipine, imdur and ranexa for her chronic chest pain. She was admitted in 02/2013 and cath at that time revealed generalized left coronary vasoconstriction relieved with intracoronary nitroglycerin, 40% proximal LAD an eccentric 50% diagonal #1, widely patent circumflex and RCA including the RCA stent and low normal LV systolic function. ECHO 02/2013 revealed low normal EF 50- 55% with moderate hypokinesis of entire inferoposterior myocardium, grade 2 diastolic dysfunction and mild MR.   Patient presented to the hospital on 09/21/13 complaining of chest pain that was not releived by NTG and her other antianginals. She continued to have episodic chest pain during her admission that did not respond to a GI cocktail, morphine or trial of IV MgSO4. She did receive dilaudid as her family reported that that is the only thing that helps her pain. She finally had some improvement with an increased dose of Ranexa to 1000 mg bid on 2/14. Her troponin levels fluctuated during the admission and peaked at  2.30: (0.30-->2.3 --> 1.98-->0.76--> .0.66--> 1.08--> 0.52-->0.48). Her EKG remained stable with no acute ST or TW changes. She was placed on heparin, which was discontinued yesterday. She has documented history of coronary vasospasm and this is thought to be the cause of her NSTEMI on this admission. The case was discussed by different providers and it was decided that with the documented history of spasm and recent cath, continued medical therapy was most appropriate.   She has been examine by Dr. Irish Lack today who has deemed her stable for discharge home. We have tried to maximized vasodilator therapy as BP tolerates; however, with soft BPs it was not possible to add a beta blocker. She will continue her current medical regimen except her Ranexa has been increased to 1000mg  BID. She will follow up with Dr. Tamala Julian in the next couple weeks.    Discharge Vitals: Blood pressure 97/60, pulse 64, temperature 98.5 F (36.9 C), temperature source Oral, resp. rate 20, height 5\' 4"  (1.626 m), weight 125 lb 10.6 oz (57 kg), SpO2 96.00%.  Labs: Lab Results  Component Value Date   WBC 5.1 09/27/2013   HGB 12.4 09/27/2013   HCT 35.9* 09/27/2013   MCV 94.0 09/27/2013   PLT 263 09/27/2013    Recent Labs Lab 09/22/13 0425  09/27/13 0440  NA 142  < > 140  K 5.0  < > 4.3  CL 103  < > 101  CO2 26  < > 25  BUN 15  < > 9  CREATININE 0.80  < > 0.75  CALCIUM 9.0  < > 9.9  PROT 6.1  --   --  BILITOT 0.2*  --   --   ALKPHOS 77  --   --   ALT 11  --   --   AST 23  --   --   GLUCOSE 114*  < > 123*  < > = values in this interval not displayed.   Lab Results  Component Value Date   CHOL 189 09/22/2013   HDL 90 09/22/2013   LDLCALC 87 09/22/2013   TRIG 62 09/22/2013     Diagnostic Studies/Procedures   Dg Chest 2 View  09/21/2013   CLINICAL DATA:  Shortness of breath.  EXAM: CHEST  2 VIEW  COMPARISON:  03/09/2013 and 02/21/2013  FINDINGS: Heart size and pulmonary vascularity are normal and the lungs are  clear. No effusions. No significant osseous abnormality.  IMPRESSION: Normal chest.     Diagnostic Cardiac Catheterization Report  Procedure Date: 02/22/2013  Referring Physician: Mellody Memos, MD  Primary Cardiologist: h.sMITH, iii, md  PROCEDURE: Left heart catheterization with selective coronary angiography, left ventriculogram.  INDICATIONS: ACS  The risks, benefits, and details of the procedure were explained to the patient. The patient verbalized understanding and wanted to proceed. Informed written consent was obtained.  PROCEDURE TECHNIQUE: After Xylocaine anesthesia a 4 French sheath was placed in the right femoral artery with a single anterior needle wall stick. Coronary angiography was done using a 4 [place] JR 4 and JL 4 catheter. Left ventriculography was done using a JR 4 catheter by hand inject.  CONTRAST: Total of 65 cc.  COMPLICATIONS: None.  HEMODYNAMICS: Aortic pressure was 97 over 52 mmHg; LV pressure was 100/6; LVEDP 13. There was no gradient between the left ventricle and aorta.  ANGIOGRAPHIC DATA: The left main coronary artery is widely patent. Calcification is noted.  The left anterior descending artery is 40% proximal stenosis at the first diagonal. The first diagonal contains an eccentric 50% ostial narrowing.  The left circumflex artery is widely patent with a dominant obtuse marginal branch it increases significantly in size after intracoronary nitroglycerin..  The right coronary artery is dominant with no significant obstruction. Widely patent mid vessel stent .  LEFT VENTRICULOGRAM: Left ventricular angiogram was done in the 30 RAO projection and revealed normal left ventricular wall motion and systolic function with an estimated ejection fraction of 50 %. LVEDP was 13 mmHg.  IMPRESSIONS: 1. Generalized left coronary vasoconstriction relieved with intracoronary nitroglycerin  2. 40% proximal LAD an eccentric 50% diagonal #1.  3. Widely patent circumflex and RCA  including the RCA stent  4. Low normal LV systolic function  RECOMMENDATION: Discontinue IV nitroglycerin. Continue antiplatelet therapy. Ambulate later today. Consider discharge later this evening or in a.m.Marland Kitchen     2D ECHO  Study Date: 02/22/2013 ------------------------------------------------------------ Indications: Chest pain 786.51 ------------------------------------------------------------ Study Conclusions - Left ventricle: The cavity size was normal. Systolic function was normal. The estimated ejection fraction was in the range of 50% to 55%. There is moderate hypokinesis of the entire inferoposterior myocardium. There was an increased relative contribution of atrial contraction to ventricular filling. Features are consistent with a pseudonormal left ventricular filling pattern, with concomitant abnormal relaxation and increased filling pressure (grade 2 diastolic - Mitral valve: Mild regurgitation.)    Discharge Medications     Medication List         aluminum-magnesium hydroxide-simethicone [id number] MG/5ML Susp  Commonly known as:  MAALOX  Take 30 mLs by mouth 3 (three) times daily as needed. After meals for indigestion     amLODipine 5 MG  tablet  Commonly known as:  NORVASC  Take 5 mg by mouth daily.     aspirin 81 MG chewable tablet  Chew 81 mg by mouth daily.     calcium-vitamin D 500-200 MG-UNIT per tablet  Commonly known as:  OSCAL WITH D  Take 1 tablet by mouth 2 (two) times daily.     citalopram 20 MG tablet  Commonly known as:  CELEXA  Take 20 mg by mouth daily.     clonazePAM 0.5 MG tablet  Commonly known as:  KLONOPIN  Take 0.5 mg by mouth 2 (two) times daily as needed. For anxiety     ferrous sulfate 325 (65 FE) MG tablet  Take 325 mg by mouth daily with breakfast.     isosorbide mononitrate 120 MG 24 hr tablet  Commonly known as:  IMDUR  Take 120 mg by mouth daily.     nitroGLYCERIN 0.4 MG SL tablet  Commonly known as:  NITROSTAT    Place 0.4 mg under the tongue every 5 (five) minutes as needed. For chest pain     pantoprazole 40 MG tablet  Commonly known as:  PROTONIX  Take 40 mg by mouth daily.     ranitidine 150 MG tablet  Commonly known as:  ZANTAC  Take 150 mg by mouth 2 (two) times daily.     ranolazine 1000 MG SR tablet  Commonly known as:  RANEXA  Take 1 tablet (1,000 mg total) by mouth 2 (two) times daily.     simvastatin 40 MG tablet  Commonly known as:  ZOCOR  Take 40 mg by mouth every evening.        Disposition   The patient will be discharged in stable condition to home.  Follow-up Information   Follow up with [name], MD. (The office will call you to make an appointment with Dr. Tamala Julian in the next few weeks. If you do not hear from them in the next 3-4 days please call them.)    Specialty:  Cardiology   Contact information:   [address] [place] [address] [phone]         Duration of Discharge Encounter: Greater than 30 minutes including physician and PA time.  SignedVertell Limber, KATHRYN PA-C 09/27/2013, 10:52 AM   I have examined the patient and reviewed assessment and plan and discussed with patient.  Agree with above as stated.  Anti-spasm meds limited by BP.  Increased Ranexa this admission.  She is walking around without angina. Time spent, 40 minutes including PA time going over meds and instructions.  Silvanna Ohmer S.

## 2013-11-25 ENCOUNTER — Encounter: Payer: Medicare Other | Admitting: Interventional Cardiology

## 2014-02-01 ENCOUNTER — Other Ambulatory Visit: Payer: Self-pay | Admitting: Interventional Cardiology

## 2014-03-21 ENCOUNTER — Ambulatory Visit (INDEPENDENT_AMBULATORY_CARE_PROVIDER_SITE_OTHER): Payer: Medicare Other | Admitting: Interventional Cardiology

## 2014-03-21 ENCOUNTER — Encounter: Payer: Self-pay | Admitting: Interventional Cardiology

## 2014-03-21 VITALS — BP 135/77 | HR 68 | Ht 59.0 in | Wt 126.0 lb

## 2014-03-21 DIAGNOSIS — I209 Angina pectoris, unspecified: Secondary | ICD-10-CM

## 2014-03-21 DIAGNOSIS — K219 Gastro-esophageal reflux disease without esophagitis: Secondary | ICD-10-CM

## 2014-03-21 DIAGNOSIS — I10 Essential (primary) hypertension: Secondary | ICD-10-CM

## 2014-03-21 DIAGNOSIS — I201 Angina pectoris with documented spasm: Secondary | ICD-10-CM

## 2014-03-21 DIAGNOSIS — E785 Hyperlipidemia, unspecified: Secondary | ICD-10-CM

## 2014-03-21 DIAGNOSIS — R0789 Other chest pain: Secondary | ICD-10-CM

## 2014-03-21 NOTE — Patient Instructions (Signed)
Your physician recommends that you continue on your current medications as directed. Please refer to the Current Medication list given to you today.  Your physician discussed the importance of regular exercise and recommended that you start or continue a regular exercise program for good health.  Your physician wants you to follow-up in: 6-9 months with Dr.Smith You will receive a reminder letter in the mail two months in advance. If you don't receive a letter, please call our office to schedule the follow-up appointment.

## 2014-03-21 NOTE — Progress Notes (Signed)
Patient ID: [name], female   DOB: [birth year], 68 y.o.   MRN: [id number]    [address] [place], [place]  [address] Phone: [phone] Fax:  [phone]  Date:  03/21/2014   ID:  [name], DOB September 30, 1945, MRN [id number]  PCP:  [name], MD   ASSESSMENT:  1. 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 Hypertension, stable 3. Hyperlipidemia   PLAN:  1. Increased aerobic activity 2. Reinforced the importance of smoking cessation. She has not smoked now for approximately 2 years 3. Followup in 6-9 months 4. Discussed importance of using sublingual nitroglycerin to break episodes of chest discomfort   SUBJECTIVE: [name] is a 68 y.o. female who is doing well. No prolonged episodes of chest pain since her last hospitalization. No episodes of syncope. No palpitations.   Wt Readings from Last 3 Encounters:  03/21/14 126 lb (57.153 kg)  09/27/13 125 lb 10.6 oz (57 kg)  03/09/13 124 lb 12.5 oz (56.6 kg)     Past Medical History  Diagnosis Date  . Hypertension   . Hypercholesteremia   . GERD (gastroesophageal reflux disease)   . Anxiety   . Hiatal hernia   . Anemia   . Blood transfusion   . Coronary artery disease   . Coronary vasospasm   . NSVT (nonsustained ventricular tachycardia) 09/26/2013  . Coronary artery spasm, wth continued episodes of chest pain.  09/26/2013  . NSTEMI (non-ST elevated myocardial infarction)     Current Outpatient Prescriptions  Medication Sig Dispense Refill  . aluminum-magnesium hydroxide-simethicone (MAALOX) [phone] MG/5ML SUSP Take 30 mLs by mouth 3 (three) times daily as needed. After meals for indigestion      . aspirin 81 MG chewable tablet Chew 81 mg by mouth daily.      . calcium-vitamin D (OSCAL WITH D) 500-200 MG-UNIT per tablet Take 1 tablet by mouth 2 (two) times daily.      . citalopram (CELEXA) 20 MG tablet Take 20 mg by mouth daily.      . clonazePAM  (KLONOPIN) 0.5 MG tablet Take 0.5 mg by mouth 2 (two) times daily as needed. For anxiety      . ferrous sulfate 325 (65 FE) MG tablet Take 325 mg by mouth daily with breakfast.      . isosorbide mononitrate (IMDUR) 120 MG 24 hr tablet Take 120 mg by mouth daily.      . nitroGLYCERIN (NITROSTAT) 0.4 MG SL tablet Place 0.4 mg under the tongue every 5 (five) minutes as needed. For chest pain      . pantoprazole (PROTONIX) 40 MG tablet Take 40 mg by mouth daily.      . ramipril (ALTACE) 2.5 MG capsule       . RANEXA 500 MG 12 hr tablet TAKE ONE TABLET BY MOUTH TWICE DAILY  60 tablet  1  . ranitidine (ZANTAC) 150 MG tablet Take 150 mg by mouth 2 (two) times daily.      . simvastatin (ZOCOR) 40 MG tablet Take 40 mg by mouth every evening.      . temazepam (RESTORIL) 15 MG capsule       . tiZANidine (ZANAFLEX) 4 MG tablet        No current facility-administered medications for this visit.    Allergies:   No Known Allergies  Social History:  The patient  reports that she quit smoking about 2 years ago. Her smoking use included Cigarettes. She has a  40 pack-year smoking history. She has quit using smokeless tobacco. She reports that she drinks about .6 ounces of alcohol per week. She reports that she does not use illicit drugs.   ROS:  Please see the history of present illness.   Episodes of syncope or palpitation. Recurring headaches compatible with migraines. She denies symptoms compatible with rate notes.   All other systems reviewed and negative.   OBJECTIVE: VS:  BP 135/77  Pulse 68  Ht 4\' 11"  (1.499 m)  Wt 126 lb (57.153 kg)  BMI 25.44 kg/m2 Well nourished, well developed, in no acute distress, female appearing older than stated age 34: normal Neck: JVD flat. Carotid bruit absent  Cardiac:  normal S1, S2; RRR; no murmur Lungs:  clear to auscultation bilaterally, no wheezing, rhonchi or rales Abd: soft, nontender, no hepatomegaly Ext: Edema absent. Pulses 2+ Skin: warm and  dry Neuro:  CNs 2-12 intact, no focal abnormalities noted  EKG:  Not performed       Signed, Illene Labrador III, MD 03/21/2014 8:45 AM

## 2014-04-18 ENCOUNTER — Encounter (HOSPITAL_COMMUNITY): Payer: Self-pay | Admitting: Emergency Medicine

## 2014-04-18 ENCOUNTER — Other Ambulatory Visit: Payer: Self-pay | Admitting: Interventional Cardiology

## 2014-04-18 ENCOUNTER — Emergency Department (HOSPITAL_COMMUNITY): Payer: Medicare Other

## 2014-04-18 ENCOUNTER — Observation Stay (HOSPITAL_COMMUNITY)
Admission: EM | Admit: 2014-04-18 | Discharge: 2014-04-20 | Disposition: A | Discharge status: Home / Self Care | Payer: Medicare Other | Attending: Cardiology | Admitting: Cardiology

## 2014-04-18 DIAGNOSIS — K219 Gastro-esophageal reflux disease without esophagitis: Secondary | ICD-10-CM | POA: Diagnosis not present

## 2014-04-18 DIAGNOSIS — Z79899 Other long term (current) drug therapy: Secondary | ICD-10-CM | POA: Diagnosis not present

## 2014-04-18 DIAGNOSIS — D649 Anemia, unspecified: Secondary | ICD-10-CM | POA: Diagnosis not present

## 2014-04-18 DIAGNOSIS — I2510 Atherosclerotic heart disease of native coronary artery without angina pectoris: Secondary | ICD-10-CM | POA: Insufficient documentation

## 2014-04-18 DIAGNOSIS — E785 Hyperlipidemia, unspecified: Secondary | ICD-10-CM | POA: Diagnosis not present

## 2014-04-18 DIAGNOSIS — Z87891 Personal history of nicotine dependence: Secondary | ICD-10-CM | POA: Diagnosis not present

## 2014-04-18 DIAGNOSIS — R0789 Other chest pain: Principal | ICD-10-CM | POA: Diagnosis present

## 2014-04-18 DIAGNOSIS — I201 Angina pectoris with documented spasm: Secondary | ICD-10-CM | POA: Diagnosis not present

## 2014-04-18 DIAGNOSIS — I7389 Other specified peripheral vascular diseases: Secondary | ICD-10-CM | POA: Diagnosis not present

## 2014-04-18 DIAGNOSIS — R7309 Other abnormal glucose: Secondary | ICD-10-CM | POA: Diagnosis not present

## 2014-04-18 DIAGNOSIS — E7800 Pure hypercholesterolemia, unspecified: Secondary | ICD-10-CM | POA: Diagnosis not present

## 2014-04-18 DIAGNOSIS — Z23 Encounter for immunization: Secondary | ICD-10-CM | POA: Insufficient documentation

## 2014-04-18 DIAGNOSIS — R739 Hyperglycemia, unspecified: Secondary | ICD-10-CM | POA: Diagnosis present

## 2014-04-18 DIAGNOSIS — I252 Old myocardial infarction: Secondary | ICD-10-CM | POA: Insufficient documentation

## 2014-04-18 DIAGNOSIS — F32A Depression, unspecified: Secondary | ICD-10-CM

## 2014-04-18 DIAGNOSIS — Z9861 Coronary angioplasty status: Secondary | ICD-10-CM | POA: Insufficient documentation

## 2014-04-18 DIAGNOSIS — I10 Essential (primary) hypertension: Secondary | ICD-10-CM | POA: Diagnosis not present

## 2014-04-18 DIAGNOSIS — F411 Generalized anxiety disorder: Secondary | ICD-10-CM | POA: Diagnosis not present

## 2014-04-18 DIAGNOSIS — Z7982 Long term (current) use of aspirin: Secondary | ICD-10-CM | POA: Insufficient documentation

## 2014-04-18 DIAGNOSIS — R079 Chest pain, unspecified: Secondary | ICD-10-CM

## 2014-04-18 DIAGNOSIS — Z8719 Personal history of other diseases of the digestive system: Secondary | ICD-10-CM

## 2014-04-18 DIAGNOSIS — F329 Major depressive disorder, single episode, unspecified: Secondary | ICD-10-CM

## 2014-04-18 LAB — I-STAT CHEM 8, ED
BUN: 19 mg/dL (ref 6–23)
Calcium, Ion: 0.96 mmol/L — ABNORMAL LOW (ref 1.13–1.30)
Chloride: 111 mEq/L (ref 96–112)
Creatinine, Ser: 0.7 mg/dL (ref 0.50–1.10)
Glucose, Bld: 108 mg/dL — ABNORMAL HIGH (ref 70–99)
HCT: 43 % (ref 36.0–46.0)
Hemoglobin: 14.6 g/dL (ref 12.0–15.0)
Potassium: 6.5 mEq/L (ref 3.7–5.3)
Sodium: 135 mEq/L — ABNORMAL LOW (ref 137–147)
TCO2: 28 mmol/L (ref 0–100)

## 2014-04-18 LAB — BASIC METABOLIC PANEL
Anion gap: 11 (ref 5–15)
Anion gap: 9 (ref 5–15)
BUN: 14 mg/dL (ref 6–23)
BUN: 13 mg/dL (ref 6–23)
CO2: 25 mEq/L (ref 19–32)
CO2: 28 mEq/L (ref 19–32)
Calcium: 8.9 mg/dL (ref 8.4–10.5)
Calcium: 8.9 mg/dL (ref 8.4–10.5)
Chloride: 104 mEq/L (ref 96–112)
Chloride: 105 mEq/L (ref 96–112)
Creatinine, Ser: 0.62 mg/dL (ref 0.50–1.10)
Creatinine, Ser: 0.66 mg/dL (ref 0.50–1.10)
GFR calc Af Amer: >90 mL/min (ref >90)
GFR calc Af Amer: >90 mL/min (ref >90)
GFR calc non Af Amer: >90 mL/min (ref >90)
GFR calc non Af Amer: 89 mL/min — ABNORMAL LOW (ref >90)
Glucose, Bld: 120 mg/dL — ABNORMAL HIGH (ref 70–99)
Glucose, Bld: 127 mg/dL — ABNORMAL HIGH (ref 70–99)
Potassium: 6.6 mEq/L (ref 3.7–5.3)
Potassium: 4.2 mEq/L (ref 3.7–5.3)
Sodium: 140 mEq/L (ref 137–147)
Sodium: 142 mEq/L (ref 137–147)

## 2014-04-18 LAB — CBC
HCT: 39.7 % (ref 36.0–46.0)
Hemoglobin: 13.8 g/dL (ref 12.0–15.0)
MCH: 32.8 pg (ref 26.0–34.0)
MCHC: 34.8 g/dL (ref 30.0–36.0)
MCV: 94.3 fL (ref 78.0–100.0)
Platelets: 250 10*3/uL (ref 150–400)
RBC: 4.21 MIL/uL (ref 3.87–5.11)
RDW: 14.1 % (ref 11.5–15.5)
WBC: 6.3 10*3/uL (ref 4.0–10.5)

## 2014-04-18 LAB — TROPONIN I: Troponin I: <0.3 ng/mL (ref <0.30)

## 2014-04-18 LAB — I-STAT TROPONIN, ED: Troponin i, poc: 0 ng/mL (ref 0.00–0.08)

## 2014-04-18 LAB — MRSA PCR SCREENING: MRSA by PCR: NEGATIVE

## 2014-04-18 MED ORDER — SIMVASTATIN 40 MG PO TABS: 40.0000 mg | ORAL_TABLET | Freq: Every evening | ORAL | Status: DC

## 2014-04-18 MED ORDER — INFLUENZA VAC SPLIT QUAD 0.5 ML IM SUSY
0.5000 mL | PREFILLED_SYRINGE | INTRAMUSCULAR | Status: AC
  Administered 2014-04-19: 0.5 mL via INTRAMUSCULAR
  Filled 2014-04-18: qty 0.5

## 2014-04-18 MED ORDER — NITROGLYCERIN 2 % TD OINT
1.0000 [in_us] | TOPICAL_OINTMENT | Freq: Four times a day (QID) | TRANSDERMAL | Status: DC
  Administered 2014-04-18 – 2014-04-19 (×2): 1 [in_us] via TOPICAL
  Filled 2014-04-18: qty 30
  Filled 2014-04-18: qty 1

## 2014-04-18 MED ORDER — MORPHINE SULFATE 4 MG/ML IJ SOLN
4.0000 mg | Freq: Once | INTRAMUSCULAR | Status: AC
  Administered 2014-04-18: 4 mg via INTRAVENOUS
  Filled 2014-04-18: qty 1

## 2014-04-18 MED ORDER — GI COCKTAIL ~~LOC~~
30.0000 mL | Freq: Three times a day (TID) | ORAL | Status: DC | PRN
  Administered 2014-04-18 (×2): 30 mL via ORAL
  Filled 2014-04-18 (×2): qty 30

## 2014-04-18 MED ORDER — ALUM & MAG HYDROXIDE-SIMETH 200-200-20 MG/5ML PO SUSP: 30.0000 mL | Freq: Three times a day (TID) | ORAL | Status: DC | PRN

## 2014-04-18 MED ORDER — CLONAZEPAM 0.5 MG PO TABS
0.5000 mg | ORAL_TABLET | Freq: Two times a day (BID) | ORAL | Status: DC | PRN
  Administered 2014-04-18: 0.5 mg via ORAL
  Filled 2014-04-18: qty 1

## 2014-04-18 MED ORDER — ZOLPIDEM TARTRATE 5 MG PO TABS: 5.0000 mg | ORAL_TABLET | Freq: Every evening | ORAL | Status: DC | PRN

## 2014-04-18 MED ORDER — ACETAMINOPHEN 325 MG PO TABS
650.0000 mg | ORAL_TABLET | ORAL | Status: DC | PRN
  Administered 2014-04-18: 650 mg via ORAL
  Filled 2014-04-18: qty 2

## 2014-04-18 MED ORDER — CALCIUM CARBONATE-VITAMIN D 500-200 MG-UNIT PO TABS
1.0000 | ORAL_TABLET | Freq: Two times a day (BID) | ORAL | Status: DC
  Administered 2014-04-18 – 2014-04-20 (×5): 1 via ORAL
  Filled 2014-04-18 (×8): qty 1

## 2014-04-18 MED ORDER — ASPIRIN 81 MG PO CHEW
81.0000 mg | CHEWABLE_TABLET | Freq: Every day | ORAL | Status: DC
  Administered 2014-04-19 – 2014-04-20 (×2): 81 mg via ORAL
  Filled 2014-04-18 (×2): qty 1

## 2014-04-18 MED ORDER — NITROGLYCERIN 0.4 MG SL SUBL: 0.4000 mg | SUBLINGUAL_TABLET | SUBLINGUAL | Status: DC | PRN

## 2014-04-18 MED ORDER — ONDANSETRON HCL 4 MG/2ML IJ SOLN: 4.0000 mg | Freq: Four times a day (QID) | INTRAMUSCULAR | Status: DC | PRN

## 2014-04-18 MED ORDER — ENOXAPARIN SODIUM 40 MG/0.4ML ~~LOC~~ SOLN
40.0000 mg | SUBCUTANEOUS | Status: DC
  Administered 2014-04-18 – 2014-04-19 (×2): 40 mg via SUBCUTANEOUS
  Filled 2014-04-18 (×3): qty 0.4

## 2014-04-18 MED ORDER — RAMIPRIL 2.5 MG PO CAPS
2.5000 mg | ORAL_CAPSULE | Freq: Every day | ORAL | Status: DC
  Administered 2014-04-18 – 2014-04-20 (×3): 2.5 mg via ORAL
  Filled 2014-04-18 (×4): qty 1

## 2014-04-18 MED ORDER — ASPIRIN 81 MG PO CHEW
324.0000 mg | CHEWABLE_TABLET | Freq: Once | ORAL | Status: AC
  Administered 2014-04-18: 324 mg via ORAL
  Filled 2014-04-18: qty 4

## 2014-04-18 MED ORDER — ATORVASTATIN CALCIUM 20 MG PO TABS
20.0000 mg | ORAL_TABLET | Freq: Every day | ORAL | Status: DC
  Administered 2014-04-18 – 2014-04-19 (×2): 20 mg via ORAL
  Filled 2014-04-18 (×4): qty 1

## 2014-04-18 MED ORDER — FAMOTIDINE 20 MG PO TABS
20.0000 mg | ORAL_TABLET | Freq: Two times a day (BID) | ORAL | Status: DC
Start: 2014-04-18 — End: 2014-04-20
  Administered 2014-04-18 – 2014-04-20 (×5): 20 mg via ORAL
  Filled 2014-04-18 (×7): qty 1

## 2014-04-18 MED ORDER — TEMAZEPAM 15 MG PO CAPS
15.0000 mg | ORAL_CAPSULE | Freq: Every evening | ORAL | Status: DC | PRN
  Administered 2014-04-18: 15 mg via ORAL
  Filled 2014-04-18: qty 1

## 2014-04-18 MED ORDER — ALPRAZOLAM 0.25 MG PO TABS: 0.2500 mg | ORAL_TABLET | Freq: Two times a day (BID) | ORAL | Status: DC | PRN

## 2014-04-18 MED ORDER — TIZANIDINE HCL 4 MG PO TABS
4.0000 mg | ORAL_TABLET | Freq: Four times a day (QID) | ORAL | Status: DC | PRN
  Administered 2014-04-20: 4 mg via ORAL
  Filled 2014-04-18 (×2): qty 1

## 2014-04-18 MED ORDER — CITALOPRAM HYDROBROMIDE 20 MG PO TABS
20.0000 mg | ORAL_TABLET | Freq: Every day | ORAL | Status: DC
  Administered 2014-04-18 – 2014-04-20 (×3): 20 mg via ORAL
  Filled 2014-04-18 (×2): qty 1
  Filled 2014-04-18: qty 2

## 2014-04-18 MED ORDER — OXYCODONE-ACETAMINOPHEN 5-325 MG PO TABS
1.0000 | ORAL_TABLET | Freq: Once | ORAL | Status: AC
  Administered 2014-04-18: 1 via ORAL
  Filled 2014-04-18: qty 1

## 2014-04-18 MED ORDER — ALUMINUM-MAGNESIUM-SIMETHICONE 200-200-20 MG/5ML PO SUSP: 30.0000 mL | Freq: Three times a day (TID) | ORAL | Status: DC | PRN

## 2014-04-18 MED ORDER — FERROUS SULFATE 325 (65 FE) MG PO TABS
325.0000 mg | ORAL_TABLET | Freq: Every day | ORAL | Status: DC
  Administered 2014-04-18 – 2014-04-20 (×3): 325 mg via ORAL
  Filled 2014-04-18 (×6): qty 1

## 2014-04-18 MED ORDER — RANOLAZINE ER 500 MG PO TB12
500.0000 mg | ORAL_TABLET | Freq: Two times a day (BID) | ORAL | Status: DC
  Administered 2014-04-18 – 2014-04-19 (×3): 500 mg via ORAL
  Filled 2014-04-18 (×6): qty 1

## 2014-04-18 MED ORDER — PANTOPRAZOLE SODIUM 40 MG PO TBEC
40.0000 mg | DELAYED_RELEASE_TABLET | Freq: Every day | ORAL | Status: DC
  Administered 2014-04-18 – 2014-04-19 (×2): 40 mg via ORAL
  Filled 2014-04-18 (×2): qty 1

## 2014-04-18 NOTE — ED Notes (Signed)
Care transferred, report received Maricela Bo, RN.

## 2014-04-18 NOTE — ED Notes (Signed)
Reported to Rob, PA-C that patient's pain is unrelieved 8/10, and reviewed medications received.  He gives verbal order for 4mg  of morphine again for management.

## 2014-04-18 NOTE — H&P (Signed)
Cardiology H&P    Patient ID: [name] MRN: [id number], DOB/AGE: Dec 10, 1945   Admit date: 04/18/2014   Primary Physician: [name], MD Primary Cardiologist: Belva Crome III   Pt. Profile: 68 yo female w/ hx of CAD s/p stenting RCA and PTCA of LAD and component of coronary spasm, HTN, HLD, GERD, anxiety, NSVT, hx of tobacco abuse who presents 9/8 with chest pain. She has a history of atypical chest pain of uncertain etiology thought to be GI versus coronary vasospasm.She was admitted in 02/2013 and cath at that time revealed generalized left coronary vasoconstriction relieved with intracoronary nitroglycerin, 40% proximal LAD an eccentric 50% diagonal #1, widely patent circumflex and RCA including the RCA stent and low normal LV systolic function. ECHO 02/2013 revealed low normal EF 50- 55% with moderate hypokinesis of entire inferoposterior myocardium, grade 2 diastolic dysfunction and mild MR. Admitted 2/15 for NSTEMI. Seen in the office on 03/21/2014 and was noted to be feeling well with no prolonged episodes of chest pain.   Her CP started yesterday morning at around 8:30am and worsened intermittently throughout the day. Describes the pain as a pressure in her left chest that radiates to her back, lasts about an hour and is alleviated by one SL nitro. Pain is a 10/10 at worse and 8/10 after SL nitro. Associated diaphoresis and dizziness. States if she does not lay down with these episodes it feels like she will faint. This pain feels like her previous chest pain from her last hospital admission in 2/15, but worse. No palpitations, shortness of breath, syncope, or leg swelling. Describes orthopnea that is long-standing with rare PND. Has a burning in her throat that was not alleviated by Maalox today. In the ED she received ASA 324mg  , morphine 4mg  x 2, Percocet x 1. Remains at a 8/10.   [name] also has had intermittent headaches for the past three weeks that wax and wane  throughout the day. Describes them as a pressure throughout and "like something is rattling inside her head when it moves". At worse they are a 10/10 and not alleviated by Aleve. No sensitivity to light or sounds. Some associated nausea, but no vomiting. No changes in vision or vertigo.   Problem List  Past Medical History  Diagnosis Date  . Hypertension   . Hypercholesteremia   . GERD (gastroesophageal reflux disease)   . Anxiety   . Hiatal hernia   . Anemia   . Blood transfusion   . Coronary artery disease   . Coronary vasospasm   . NSVT (nonsustained ventricular tachycardia) 09/26/2013  . Coronary artery spasm, wth continued episodes of chest pain.  09/26/2013  . NSTEMI (non-ST elevated myocardial infarction)     Past Surgical History  Procedure Laterality Date  . Cardiac stent Left 02/2013  . Shoulder surgery    . Breast      right, tumor   . Knee surgery  left     Allergies No Known Allergies   Home Medications  Medication Sig  aluminum-magnesium hydroxide-simethicone (MAALOX) [phone] MG/5ML SUSP Take 30 mLs by mouth 3 (three) times daily as needed. After meals for indigestion  aspirin 81 MG chewable tablet Chew 81 mg by mouth daily.  calcium-vitamin D (OSCAL WITH D) 500-200 MG-UNIT per tablet Take 1 tablet by mouth 2 (two) times daily.  citalopram (CELEXA) 20 MG tablet Take 20 mg by mouth daily.  clonazePAM (KLONOPIN) 0.5 MG tablet Take 0.5 mg by mouth 2 (two) times daily as  needed. For anxiety  ferrous sulfate 325 (65 FE) MG tablet Take 325 mg by mouth daily with breakfast.  isosorbide mononitrate (IMDUR) 120 MG 24 hr tablet Take 120 mg by mouth daily.  nitroGLYCERIN (NITROSTAT) 0.4 MG SL tablet Place 0.4 mg under the tongue every 5 (five) minutes as needed. For chest pain  pantoprazole (PROTONIX) 40 MG tablet Take 40 mg by mouth daily.  ramipril (ALTACE) 2.5 MG capsule Take 2.5 mg by mouth daily.   ranitidine (ZANTAC) 150 MG tablet Take 150 mg by mouth 2 (two)  times daily.  ranolazine (RANEXA) 500 MG 12 hr tablet Take 500 mg by mouth 2 (two) times daily.  simvastatin (ZOCOR) 40 MG tablet Take 40 mg by mouth every evening.  temazepam (RESTORIL) 15 MG capsule Take 15 mg by mouth at bedtime as needed for sleep.   tiZANidine (ZANAFLEX) 4 MG tablet Take 4 mg by mouth every 6 (six) hours as needed for muscle spasms.     Family History  History reviewed. No pertinent family history. Family Status  Relation Status Death Age  . Mother Deceased   . Father Deceased      Social History  History   Social History  . Marital Status: Married    Spouse Name: N/A    Number of Children: N/A  . Years of Education: N/A   Occupational History  . Not on file.   Social History Main Topics  . Smoking status: Former Smoker -- 1.00 packs/day for 40 years    Types: Cigarettes    Quit date: 10/10/2011  . Smokeless tobacco: Former Systems developer  . Alcohol Use: 0.6 oz/week    1 Cans of beer per week     Comment: occasional  . Drug Use: No  . Sexual Activity: No   Other Topics Concern  . Not on file   Social History Narrative  . No narrative on file     Review of Systems General:  No chills, fever, night sweats or weight changes.  Cardiovascular:  No dyspnea on exertion, edema, palpitations. Dermatological: No rash, lesions/masses Respiratory: No cough, dyspnea Urologic: No hematuria, dysuria Abdominal:   No vomiting, diarrhea, bright red blood per rectum, melena, or hematemesis Neurologic:  No visual changes, wkns, changes in mental status. All other systems reviewed and are otherwise negative except as noted above.  Physical Exam  Blood pressure 106/73, pulse 52, temperature 98.6 F (37 C), temperature source Oral, resp. rate 14, height 4\' 11"  (1.499 m), weight 126 lb (57.153 kg), SpO2 98.00%.  General: Pleasant AA female, NAD Psych: Normal affect. Neuro: Alert and oriented X 3. Moves all extremities spontaneously. HEENT: Normal  Neck: Supple  without bruits or JVD not elevated. Lungs:  Resp regular and unlabored, CTA bilaterally. Heart: RRR no s3, s4, or murmurs. Abdomen: Soft, non-tender, non-distended, BS + x 4.  Extremities: No clubbing, cyanosis or edema. DP/PT/Radials 2+ and equal bilaterally.  Labs  No results found for this basename: CKTOTAL, CKMB, TROPONINI,  in the last 72 hours Lab Results  Component Value Date   WBC 6.3 04/18/2014   HGB 14.6 04/18/2014   HCT 43.0 04/18/2014   MCV 94.3 04/18/2014   PLT 250 04/18/2014     Recent Labs Lab 04/18/14 0658  NA 142  K 4.2  CL 105  CO2 28  BUN 13  CREATININE 0.66  CALCIUM 8.9  GLUCOSE 127*   Lab Results  Component Value Date   CHOL 189 09/22/2013   HDL 90 09/22/2013  LDLCALC 87 09/22/2013   TRIG 62 09/22/2013   Lab Results  Component Value Date   DDIMER <0.27 03/09/2013     Radiology/Studies  Dg Chest Port 1 View  04/18/2014   CLINICAL DATA:  Chest pain.  EXAM: PORTABLE CHEST - 1 VIEW  COMPARISON:  09/21/2013  FINDINGS: The heart size and mediastinal contours are within normal limits. Both lungs are clear. The visualized skeletal structures are unremarkable. Postoperative changes in the cervical spine.  IMPRESSION: No active disease.   Electronically Signed   By: Lucienne Capers M.D.   On: 04/18/2014 05:09   Left Heart Catheterization: 02/22/2013 HEMODYNAMICS: Aortic pressure was 97 over 52 mmHg; LV pressure was 100/6; LVEDP 13. There was no gradient between the left ventricle and aorta.  ANGIOGRAPHIC DATA: The left main coronary artery is widely patent. Calcification is noted.  The left anterior descending artery is 40% proximal stenosis at the first diagonal. The first diagonal contains an eccentric 50% ostial narrowing.  The left circumflex artery is widely patent with a dominant obtuse marginal branch it increases significantly in size after intracoronary nitroglycerin..  The right coronary artery is dominant with no significant obstruction. Widely patent mid  vessel stent .  LEFT VENTRICULOGRAM: Left ventricular angiogram was done in the 30 RAO projection and revealed normal left ventricular wall motion and systolic function with an estimated ejection fraction of 50 %. LVEDP was 13 mmHg.  IMPRESSIONS: 1. Generalized left coronary vasoconstriction relieved with intracoronary nitroglycerin  2. 40% proximal LAD an eccentric 50% diagonal #1.  3. Widely patent circumflex and RCA including the RCA stent  4. Low normal LV systolic function  RECOMMENDATION: Discontinue IV nitroglycerin. Continue antiplatelet therapy. Ambulate later today. Consider discharge later this evening or in a.m..  ECHO: 02/22/2013 Study Conclusions - Left ventricle: The cavity size was normal. Systolic function was normal. The estimated ejection fraction was in the range of 50% to 55%. There is moderate hypokinesis of the entire inferoposterior myocardium. There was an increased relative contribution of atrial contraction to ventricular filling. Features are consistent with a pseudonormal left ventricular filling pattern, with concomitant abnormal relaxation and increased filling pressure (grade 2 diastolic dysfunction). - Mitral valve: Mild regurgitation.  ECG: Sinus bradycardia with T wave inversion in V5 and V6 (new from 09/23/2013)  ASSESSMENT AND PLAN  --Chest Pain:history of chest pain, history of GI origin, but also documented CAD as well as coronary vasospasm by cath 7/14, NSTEMI in 2/15 felt spasm and treated medically. Initial ez negative, EKG shows new (since 2/15) T wave inversion in V5 and V6, on ASA, Imdur, SL nirto PRN, ACEI, Ranexa, statin, on protonix &  Zantac. Not on BB due to bradycardia and soft BP. Admit for observation, nitro paste and pain control. Trial GI cocktail. Patient has had 6 heart caths without intervention. Do not believe cath will provide new information. Spasm clearly documented in the past.   --HTN: Stable, continue current  medications  --HLD: Lipids controlled 2/15, continue statin   --false hyperkalemia: initial blood draw revealed K of 6.6. Same sample was retested and revealed to be 6.6. Sample believed to be hemolyzed. Redraw revealed K of 4.2.    Signed,  Lucy Antigua PA-S2  Seen and agree with changes made. Rosaria Ferries, PA-C 04/18/2014, 9:22 AM   Pager [phone]   History and all data above reviewed.  Patient examined.  I agree with the findings as above.   The patient reports chest pain similar to previous episodes.  Thus  far there are nonspecific changes on the EKG and enzymes are negative.  We reviewed six previous caths with non obstructive disease.  She does have a history of coronary spasm and did have a distant right coronary stent.  Her pain is somewhat reproducible with pressure in the mid epigastric area.  She also reports blood in her stools for the last two weeks although she is not anemic.  The patient exam reveals COR:RRR  ,  Lungs: Clear  ,  Abd: Positive bowel sounds, no rebound no guarding, mild tenderness to deep palpation in the mid epigastric area.  Ext No edema  .  All available labs, radiology testing, previous records reviewed. Agree with documented assessment and plan. Chest pain:  I think that this is atypical for angina.  Given this similar presentation with multiple caths demonstrating coronary spasm but no fixed obstructive disease I would not suggest further invasive work up unless her enzymes become positive or there are diagnostic EKG changes.  I would suggest that we check stool guaiacs and consider out patient GI evaluation given the nature of the pain and her report of red blood.    Shalom Mcguiness  10:30 AM  04/18/2014

## 2014-04-18 NOTE — ED Notes (Signed)
Pt alert, NAD, calm, interactive, denies sob, nausea or dizziness, rates pain 7/10, no dyspnea noted.

## 2014-04-18 NOTE — ED Notes (Signed)
Patient has already received nitro X3 at home today prior to EMS arrival.

## 2014-04-18 NOTE — ED Notes (Signed)
Attempted to phone report. This RN was told the receiving RN was in a patient room. Unit was given this RN's contact information.

## 2014-04-18 NOTE — ED Provider Notes (Signed)
CSN: [id number]     Arrival date & time 04/18/14  0417 History   First MD Initiated Contact with Patient 04/18/14 0601     Chief Complaint  Patient presents with  . Chest Pain     (Consider location/radiation/quality/duration/timing/severity/associated sxs/prior Treatment) HPI Comments: Patient with hx of CAD with stent presents to the ED with a chief complaint of chest pain.  She states that the pain started yesterday morning when she awoke.  She states that it has been intermittent.  She states that it improves with nitro.  She states that the pain is 8/10.  It does not radiate.  She reports associated SOB.  She has not taken aspirin today.  She is followed by Dr. Daneen Schick of cardiology.  Per EMS EKG showed ST depressions in lateral leads, which resolved with O2.  The history is provided by the patient. No language interpreter was used.    Past Medical History  Diagnosis Date  . Hypertension   . Hypercholesteremia   . GERD (gastroesophageal reflux disease)   . Anxiety   . Hiatal hernia   . Anemia   . Blood transfusion   . Coronary artery disease   . Coronary vasospasm   . NSVT (nonsustained ventricular tachycardia) 09/26/2013  . Coronary artery spasm, wth continued episodes of chest pain.  09/26/2013  . NSTEMI (non-ST elevated myocardial infarction)    Past Surgical History  Procedure Laterality Date  . Cardiac stent Left 02/2013  . Shoulder surgery    . Breast      right, tumor   . Knee surgery  left   History reviewed. No pertinent family history. History  Substance Use Topics  . Smoking status: Former Smoker -- 1.00 packs/day for 40 years    Types: Cigarettes    Quit date: 10/10/2011  . Smokeless tobacco: Former Systems developer  . Alcohol Use: 0.6 oz/week    1 Cans of beer per week     Comment: occasional   OB History   Grav Para Term Preterm Abortions TAB SAB Ect Mult Living                 Review of Systems  Constitutional: Negative for fever and chills.   Respiratory: Positive for shortness of breath.   Cardiovascular: Positive for chest pain.  Gastrointestinal: Negative for nausea, vomiting, diarrhea and constipation.  Genitourinary: Negative for dysuria.  All other systems reviewed and are negative.     Allergies  Review of patient's allergies indicates no known allergies.  Home Medications   Prior to Admission medications   Medication Sig Start Date End Date Taking? Authorizing Provider  aluminum-magnesium hydroxide-simethicone (MAALOX) [phone] MG/5ML SUSP Take 30 mLs by mouth 3 (three) times daily as needed. After meals for indigestion   Yes Historical Provider, MD  aspirin 81 MG chewable tablet Chew 81 mg by mouth daily.   Yes Historical Provider, MD  calcium-vitamin D (OSCAL WITH D) 500-200 MG-UNIT per tablet Take 1 tablet by mouth 2 (two) times daily.   Yes Historical Provider, MD  citalopram (CELEXA) 20 MG tablet Take 20 mg by mouth daily.   Yes Historical Provider, MD  clonazePAM (KLONOPIN) 0.5 MG tablet Take 0.5 mg by mouth 2 (two) times daily as needed. For anxiety   Yes Historical Provider, MD  ferrous sulfate 325 (65 FE) MG tablet Take 325 mg by mouth daily with breakfast.   Yes Historical Provider, MD  isosorbide mononitrate (IMDUR) 120 MG 24 hr tablet Take 120 mg by  mouth daily.   Yes Historical Provider, MD  nitroGLYCERIN (NITROSTAT) 0.4 MG SL tablet Place 0.4 mg under the tongue every 5 (five) minutes as needed. For chest pain   Yes Historical Provider, MD  pantoprazole (PROTONIX) 40 MG tablet Take 40 mg by mouth daily.   Yes Historical Provider, MD  ramipril (ALTACE) 2.5 MG capsule Take 2.5 mg by mouth daily.  02/24/14  Yes Historical Provider, MD  ranitidine (ZANTAC) 150 MG tablet Take 150 mg by mouth 2 (two) times daily.   Yes Historical Provider, MD  ranolazine (RANEXA) 500 MG 12 hr tablet Take 500 mg by mouth 2 (two) times daily.   Yes Historical Provider, MD  simvastatin (ZOCOR) 40 MG tablet Take 40 mg by mouth  every evening.   Yes Historical Provider, MD  temazepam (RESTORIL) 15 MG capsule Take 15 mg by mouth at bedtime as needed for sleep.  02/07/14  Yes Historical Provider, MD  tiZANidine (ZANAFLEX) 4 MG tablet Take 4 mg by mouth every 6 (six) hours as needed for muscle spasms.  02/07/14  Yes Historical Provider, MD   BP 138/73  Pulse 58  Temp(Src) 97.8 F (36.6 C) (Oral)  Resp 22  Ht 4\' 11"  (1.499 m)  Wt 126 lb (57.153 kg)  BMI 25.44 kg/m2  SpO2 98% Physical Exam  Nursing note and vitals reviewed. Constitutional: She is oriented to person, place, and time. She appears well-developed and well-nourished.  HENT:  Head: Normocephalic and atraumatic.  Eyes: Conjunctivae and EOM are normal. Pupils are equal, round, and reactive to light.  Neck: Normal range of motion. Neck supple.  Cardiovascular: Regular rhythm.  Exam reveals no gallop and no friction rub.   No murmur heard. bradycardic  Pulmonary/Chest: Effort normal and breath sounds normal. No respiratory distress. She has no wheezes. She has no rales. She exhibits no tenderness.  Abdominal: Soft. Bowel sounds are normal. She exhibits no distension and no mass. There is no tenderness. There is no rebound and no guarding.  Musculoskeletal: Normal range of motion. She exhibits no edema and no tenderness.  Neurological: She is alert and oriented to person, place, and time.  Skin: Skin is warm and dry.  Psychiatric: She has a normal mood and affect. Her behavior is normal. Judgment and thought content normal.    ED Course  Procedures (including critical care time) Labs Review Labs Reviewed  BASIC METABOLIC PANEL - Abnormal; Notable for the following:    Potassium 6.6 (*)    Glucose, Bld 120 (*)    All other components within normal limits  CBC  I-STAT TROPOININ, ED  I-STAT CHEM 8, ED    Imaging Review Dg Chest Port 1 View  04/18/2014   CLINICAL DATA:  Chest pain.  EXAM: PORTABLE CHEST - 1 VIEW  COMPARISON:  09/21/2013  FINDINGS: The  heart size and mediastinal contours are within normal limits. Both lungs are clear. The visualized skeletal structures are unremarkable. Postoperative changes in the cervical spine.  IMPRESSION: No active disease.   Electronically Signed   By: Lucienne Capers M.D.   On: 04/18/2014 05:09     EKG Interpretation   Date/Time:  Tuesday April 18 2014 06:45:10 EDT Ventricular Rate:  54 PR Interval:  160 QRS Duration: 92 QT Interval:  532 QTC Calculation: 504 R Axis:   -4 Text Interpretation:  Sinus rhythm new TWI V5 V6 Prolonged QT interval  Confirmed by Glynn Octave [id number]) on 04/18/2014 6:51:18 AM      MDM  Final diagnoses:  Chest pain, unspecified chest pain type    Patient with known CAD and stent.  New chest pain that has been intermittent since yesterday.  Worsened with exertion.  Relieved with nitro.  Pain worsened this morning.  Will give aspirin and repeat EKG.  Initial troponin is negative.  K is 6.6, but suspect hemolysis.  This will be redrawn.   6:46 AM Additional chem 8 resulted with K of 6.5, but initial blood draw was used.  I informed the nursing staff that they need to get a new specimen.  There are no peaked T-waves on EKG.  She does have some new flipped t-waves in V5, V6.  Patient seen by and discussed with Dr. Claudine Mouton, who agrees with plan for admission to cardiology.   Patient to be admitted by Cards.  Repeat K is 4.2.  Montine Circle, PA-C 04/18/14 [phone]

## 2014-04-18 NOTE — ED Notes (Signed)
Per EMS, patient has chest pain intermittent that started yesterday.  She took prescribed nitro tablets x3, to relieve pain.  Last tablet was 1 hour prior to EMS arrival.  Patient states she took aspirin as part of her daily medications, 81mg /day.  Hx of esophageal reflux, and bradycardia.  12 lead en route showed ST depression in lateral lead, resolved by applying O2 via nasal cannula.  Patient was 100% on room air prior to applying.  Describes pain as a "filling sensation". 20g placed in left AC by ems.

## 2014-04-18 NOTE — ED Provider Notes (Signed)
Patient presents to the ED for concern of chest pain.  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  L sided, pressure, non-exertional, associated with SOB and diaphoresis with radiation to the L arm.  No emesis.  EKG changes noted as well with STD in V5 and V6.  Agree with PA plan for admission for cardiac workup.  Medical screening examination/treatment/procedure(s) were conducted as a shared visit with non-physician practitioner(s) and myself.  I personally evaluated the patient during the encounter.   EKG Interpretation   Date/Time:  Tuesday April 18 2014 06:45:10 EDT Ventricular Rate:  54 PR Interval:  160 QRS Duration: 92 QT Interval:  532 QTC Calculation: 504 R Axis:   -4 Text Interpretation:  Sinus rhythm new TWI V5 V6 Prolonged QT interval  Confirmed by Glynn Octave [phone]) on 04/18/2014 6:51:18 AM        Everlene Balls, MD 04/18/14 1758

## 2014-04-18 NOTE — ED Notes (Signed)
Lab called in as 6.6, and considered hemolyzed, will have to recollect.

## 2014-04-19 ENCOUNTER — Other Ambulatory Visit: Payer: Self-pay

## 2014-04-19 DIAGNOSIS — Z8719 Personal history of other diseases of the digestive system: Secondary | ICD-10-CM

## 2014-04-19 DIAGNOSIS — R0789 Other chest pain: Secondary | ICD-10-CM

## 2014-04-19 DIAGNOSIS — E785 Hyperlipidemia, unspecified: Secondary | ICD-10-CM | POA: Diagnosis not present

## 2014-04-19 DIAGNOSIS — I209 Angina pectoris, unspecified: Secondary | ICD-10-CM

## 2014-04-19 DIAGNOSIS — K219 Gastro-esophageal reflux disease without esophagitis: Secondary | ICD-10-CM

## 2014-04-19 DIAGNOSIS — I10 Essential (primary) hypertension: Secondary | ICD-10-CM | POA: Diagnosis not present

## 2014-04-19 LAB — POCT I-STAT, CHEM 8
BUN: 19 mg/dL (ref 6–23)
Calcium, Ion: 0.96 mmol/L — ABNORMAL LOW (ref 1.13–1.30)
Chloride: 111 mEq/L (ref 96–112)
Creatinine, Ser: 0.7 mg/dL (ref 0.50–1.10)
Glucose, Bld: 108 mg/dL — ABNORMAL HIGH (ref 70–99)
HCT: 43 % (ref 36.0–46.0)
Hemoglobin: 14.6 g/dL (ref 12.0–15.0)
Potassium: 6.5 mEq/L (ref 3.7–5.3)
Sodium: 135 mEq/L — ABNORMAL LOW (ref 137–147)
TCO2: 28 mmol/L (ref 0–100)

## 2014-04-19 LAB — LIPID PANEL
Cholesterol: 137 mg/dL (ref 0–200)
HDL: 79 mg/dL (ref >39)
LDL Cholesterol: 37 mg/dL (ref 0–99)
Total CHOL/HDL Ratio: 1.7 RATIO
Triglycerides: 106 mg/dL (ref <150)
VLDL: 21 mg/dL (ref 0–40)

## 2014-04-19 LAB — HEMOGLOBIN A1C
Hgb A1c MFr Bld: 6.3 % — ABNORMAL HIGH (ref <5.7)
Mean Plasma Glucose: 134 mg/dL — ABNORMAL HIGH (ref <117)

## 2014-04-19 LAB — COMPREHENSIVE METABOLIC PANEL
ALT: 9 U/L (ref 0–35)
AST: 13 U/L (ref 0–37)
Albumin: 2.8 g/dL — ABNORMAL LOW (ref 3.5–5.2)
Alkaline Phosphatase: 62 U/L (ref 39–117)
Anion gap: 11 (ref 5–15)
BUN: 15 mg/dL (ref 6–23)
CO2: 26 mEq/L (ref 19–32)
Calcium: 8.7 mg/dL (ref 8.4–10.5)
Chloride: 105 mEq/L (ref 96–112)
Creatinine, Ser: 0.85 mg/dL (ref 0.50–1.10)
GFR calc Af Amer: 80 mL/min — ABNORMAL LOW (ref >90)
GFR calc non Af Amer: 69 mL/min — ABNORMAL LOW (ref >90)
Glucose, Bld: 112 mg/dL — ABNORMAL HIGH (ref 70–99)
Potassium: 3.7 mEq/L (ref 3.7–5.3)
Sodium: 142 mEq/L (ref 137–147)
Total Bilirubin: <0.2 mg/dL — ABNORMAL LOW (ref 0.3–1.2)
Total Protein: 5.4 g/dL — ABNORMAL LOW (ref 6.0–8.3)

## 2014-04-19 LAB — TROPONIN I
Troponin I: <0.3 ng/mL (ref <0.30)
Troponin I: <0.3 ng/mL (ref <0.30)

## 2014-04-19 MED ORDER — AMLODIPINE BESYLATE 5 MG PO TABS
5.0000 mg | ORAL_TABLET | Freq: Every day | ORAL | Status: DC
  Administered 2014-04-19 – 2014-04-20 (×2): 5 mg via ORAL
  Filled 2014-04-19 (×2): qty 1

## 2014-04-19 MED ORDER — RANOLAZINE ER 500 MG PO TB12
1000.0000 mg | ORAL_TABLET | Freq: Two times a day (BID) | ORAL | Status: DC
  Administered 2014-04-20: 1000 mg via ORAL
  Filled 2014-04-19 (×3): qty 2

## 2014-04-19 MED ORDER — ACETAMINOPHEN 500 MG PO TABS: 500.0000 mg | ORAL_TABLET | Freq: Four times a day (QID) | ORAL | Status: DC | PRN

## 2014-04-19 MED ORDER — PANTOPRAZOLE SODIUM 40 MG PO TBEC
40.0000 mg | DELAYED_RELEASE_TABLET | Freq: Two times a day (BID) | ORAL | Status: DC
Start: 2014-04-19 — End: 2014-04-20
  Administered 2014-04-19 – 2014-04-20 (×2): 40 mg via ORAL
  Filled 2014-04-19 (×2): qty 1

## 2014-04-19 MED ORDER — ISOSORBIDE MONONITRATE ER 60 MG PO TB24
120.0000 mg | ORAL_TABLET | Freq: Every day | ORAL | Status: DC
  Administered 2014-04-19 – 2014-04-20 (×2): 120 mg via ORAL
  Filled 2014-04-19 (×2): qty 2

## 2014-04-19 MED ORDER — OXYCODONE-ACETAMINOPHEN 5-325 MG PO TABS
1.0000 | ORAL_TABLET | Freq: Four times a day (QID) | ORAL | Status: DC | PRN
  Administered 2014-04-19 (×2): 1 via ORAL
  Filled 2014-04-19 (×2): qty 1

## 2014-04-19 NOTE — Progress Notes (Signed)
Utilization review completed.  

## 2014-04-19 NOTE — Progress Notes (Deleted)
Patient placed on airborne precautions per Dr Titus Mould to r/o TB. Appropriate signage placed on door and son at bedside informed. Son currently compliant wearing mask. Facilities called to check room.

## 2014-04-19 NOTE — Progress Notes (Signed)
DAILY PROGRESS NOTE  Subjective:  Still complains of chest discomfort today - 7/10. CE's are negative. Not much relief with nitrates. She is not on a CCB. Also c/o headache.  Objective:  Temp:  [97.6 F (36.4 C)-98 F (36.7 C)] 98 F (36.7 C) (09/09 0803) Pulse Rate:  [44-57] 48 (09/09 0300) Resp:  [13-23] 19 (09/09 0803) BP: (83-136)/(36-77) 136/62 mmHg (09/09 0803) SpO2:  [94 %-100 %] 98 % (09/09 0803) Weight:  [123 lb 8 oz (56.019 kg)] 123 lb 8 oz (56.019 kg) (09/09 0300) Weight change: -2 lb 8 oz (-1.134 kg)  Intake/Output from previous day: 09/08 0701 - 09/09 0700 In: 240 [P.O.:240] Out: 400 [Urine:400]  Intake/Output from this shift:    Medications: Current Facility-Administered Medications  Medication Dose Route Frequency Provider Last Rate Last Dose  . acetaminophen (TYLENOL) tablet 650 mg  650 mg Oral Q4H PRN Evelene Croon Barrett, PA-C   650 mg at 04/18/14 2023  . ALPRAZolam (XANAX) tablet 0.25 mg  0.25 mg Oral BID PRN Evelene Croon Barrett, PA-C      . alum & mag hydroxide-simeth (MAALOX/MYLANTA) 200-200-20 MG/5ML suspension 30 mL  30 mL Oral Q8H PRN Minus Breeding, MD      . aspirin chewable tablet 81 mg  81 mg Oral Daily Rhonda G Barrett, PA-C   81 mg at 04/19/14 1002  . atorvastatin (LIPITOR) tablet 20 mg  20 mg Oral q1800 Minus Breeding, MD   20 mg at 04/18/14 1926  . calcium-vitamin D (OSCAL WITH D) 500-200 MG-UNIT per tablet 1 tablet  1 tablet Oral BID Evelene Croon Barrett, PA-C   1 tablet at 04/19/14 1002  . citalopram (CELEXA) tablet 20 mg  20 mg Oral Daily Rhonda G Barrett, PA-C   20 mg at 04/19/14 1002  . clonazePAM (KLONOPIN) tablet 0.5 mg  0.5 mg Oral BID PRN Evelene Croon Barrett, PA-C   0.5 mg at 04/18/14 2024  . enoxaparin (LOVENOX) injection 40 mg  40 mg Subcutaneous Q24H Rhonda G Barrett, PA-C   40 mg at 04/18/14 2255  . famotidine (PEPCID) tablet 20 mg  20 mg Oral BID Rhonda G Barrett, PA-C   20 mg at 04/19/14 1002  . ferrous sulfate tablet 325 mg  325 mg Oral Q  breakfast Evelene Croon Barrett, PA-C   325 mg at 04/19/14 1002  . gi cocktail (Maalox,Lidocaine,Donnatal)  30 mL Oral TID PRN Evelene Croon Barrett, PA-C   30 mL at 04/18/14 2023  . nitroGLYCERIN (NITROGLYN) 2 % ointment 1 inch  1 inch Topical 4 times per day Lonn Georgia, PA-C   1 inch at 04/19/14 0758  . nitroGLYCERIN (NITROSTAT) SL tablet 0.4 mg  0.4 mg Sublingual Q5 Min x 3 PRN Rhonda G Barrett, PA-C      . ondansetron (ZOFRAN) injection 4 mg  4 mg Intravenous Q6H PRN Rhonda G Barrett, PA-C      . pantoprazole (PROTONIX) EC tablet 40 mg  40 mg Oral Daily Rhonda G Barrett, PA-C   40 mg at 04/19/14 1009  . ramipril (ALTACE) capsule 2.5 mg  2.5 mg Oral Daily Rhonda G Barrett, PA-C   2.5 mg at 04/19/14 1002  . ranolazine (RANEXA) 12 hr tablet 500 mg  500 mg Oral BID Rhonda G Barrett, PA-C   500 mg at 04/19/14 1002  . temazepam (RESTORIL) capsule 15 mg  15 mg Oral QHS PRN Evelene Croon Barrett, PA-C   15 mg at 04/18/14 2254  . tiZANidine (ZANAFLEX) tablet  4 mg  4 mg Oral Q6H PRN Rhonda G Barrett, PA-C      . zolpidem (AMBIEN) tablet 5 mg  5 mg Oral QHS PRN Lonn Georgia, PA-C        Physical Exam: General appearance: alert and mild distress Neck: no carotid bruit and no JVD Lungs: clear to auscultation bilaterally Heart: regular rate and rhythm, S1, S2 normal, no murmur, click, rub or gallop Abdomen: soft, non-tender; bowel sounds normal; no masses,  no organomegaly Extremities: extremities normal, atraumatic, no cyanosis or edema Pulses: 2+ and symmetric Skin: Skin color, texture, turgor normal. No rashes or lesions Neurologic: Mental status: Alert, oriented, thought content appropriate Psych: Appears uncomfortable  Lab Results: Results for orders placed during the hospital encounter of 04/18/14 (from the past 48 hour(s))  CBC     Status: None   Collection Time    04/18/14  4:45 AM      Result Value Ref Range   WBC 6.3  4.0 - 10.5 K/uL   RBC 4.21  3.87 - 5.11 MIL/uL   Hemoglobin 13.8  12.0  - 15.0 g/dL   HCT 39.7  36.0 - 46.0 %   MCV 94.3  78.0 - 100.0 fL   MCH 32.8  26.0 - 34.0 pg   MCHC 34.8  30.0 - 36.0 g/dL   RDW 14.1  11.5 - 15.5 %   Platelets 250  150 - 400 K/uL  BASIC METABOLIC PANEL     Status: Abnormal   Collection Time    04/18/14  4:45 AM      Result Value Ref Range   Sodium 140  137 - 147 mEq/L   Potassium 6.6 (*) 3.7 - 5.3 mEq/L   Comment: HEMOLYSIS AT THIS LEVEL MAY AFFECT RESULT     CRITICAL RESULT CALLED TO, READ BACK BY AND VERIFIED WITH:     WHITE M,RN 04/18/14 0534 WAYK   Chloride 104  96 - 112 mEq/L   CO2 25  19 - 32 mEq/L   Glucose, Bld 120 (*) 70 - 99 mg/dL   BUN 14  6 - 23 mg/dL   Creatinine, Ser 0.62  0.50 - 1.10 mg/dL   Calcium 8.9  8.4 - 10.5 mg/dL   GFR calc non Af Amer >90  >90 mL/min   GFR calc Af Amer >90  >90 mL/min   Comment: (NOTE)     The eGFR has been calculated using the CKD EPI equation.     This calculation has not been validated in all clinical situations.     eGFR's persistently <90 mL/min signify possible Chronic Kidney     Disease.   Anion gap 11  5 - 15  I-STAT TROPOININ, ED     Status: None   Collection Time    04/18/14  5:09 AM      Result Value Ref Range   Troponin i, poc 0.00  0.00 - 0.08 ng/mL   Comment 3            Comment: Due to the release kinetics of cTnI,     a negative result within the first hours     of the onset of symptoms does not rule out     myocardial infarction with certainty.     If myocardial infarction is still suspected,     repeat the test at appropriate intervals.  I-STAT CHEM 8, ED     Status: Abnormal   Collection Time    04/18/14  6:36 AM  Result Value Ref Range   Sodium 135 (*) 137 - 147 mEq/L   Potassium 6.5 (*) 3.7 - 5.3 mEq/L   Chloride 111  96 - 112 mEq/L   BUN 19  6 - 23 mg/dL   Creatinine, Ser 0.70  0.50 - 1.10 mg/dL   Glucose, Bld 108 (*) 70 - 99 mg/dL   Calcium, Ion 0.96 (*) 1.13 - 1.30 mmol/L   TCO2 28  0 - 100 mmol/L   Hemoglobin 14.6  12.0 - 15.0 g/dL   HCT  43.0  36.0 - 46.0 %   Comment NOTIFIED PHYSICIAN    POCT I-STAT, CHEM 8     Status: Abnormal   Collection Time    04/18/14  6:36 AM      Result Value Ref Range   Sodium 135 (*) 137 - 147 mEq/L   Potassium 6.5 (*) 3.7 - 5.3 mEq/L   Comment: HEMOLYZED SPECIMEN, RESULTS MAY BE AFFECTED     REPEATED TO VERIFY   Chloride 111  96 - 112 mEq/L   BUN 19  6 - 23 mg/dL   Creatinine, Ser 0.70  0.50 - 1.10 mg/dL   Glucose, Bld 108 (*) 70 - 99 mg/dL   Calcium, Ion 0.96 (*) 1.13 - 1.30 mmol/L   TCO2 28  0 - 100 mmol/L   Hemoglobin 14.6  12.0 - 15.0 g/dL   HCT 43.0  36.0 - 46.0 %   Comment NOTIFIED PHYSICIAN    BASIC METABOLIC PANEL     Status: Abnormal   Collection Time    04/18/14  6:58 AM      Result Value Ref Range   Sodium 142  137 - 147 mEq/L   Potassium 4.2  3.7 - 5.3 mEq/L   Chloride 105  96 - 112 mEq/L   CO2 28  19 - 32 mEq/L   Glucose, Bld 127 (*) 70 - 99 mg/dL   BUN 13  6 - 23 mg/dL   Creatinine, Ser 0.66  0.50 - 1.10 mg/dL   Calcium 8.9  8.4 - 10.5 mg/dL   GFR calc non Af Amer 89 (*) >90 mL/min   GFR calc Af Amer >90  >90 mL/min   Comment: (NOTE)     The eGFR has been calculated using the CKD EPI equation.     This calculation has not been validated in all clinical situations.     eGFR's persistently <90 mL/min signify possible Chronic Kidney     Disease.   Anion gap 9  5 - 15  TROPONIN I     Status: None   Collection Time    04/18/14  6:10 PM      Result Value Ref Range   Troponin I <0.30  <0.30 ng/mL   Comment:            Due to the release kinetics of cTnI,     a negative result within the first hours     of the onset of symptoms does not rule out     myocardial infarction with certainty.     If myocardial infarction is still suspected,     repeat the test at appropriate intervals.  MRSA PCR SCREENING     Status: None   Collection Time    04/18/14  8:12 PM      Result Value Ref Range   MRSA by PCR NEGATIVE  NEGATIVE   Comment:            The GeneXpert MRSA  Assay (FDA     approved for NASAL specimens     only), is one component of a     comprehensive MRSA colonization     surveillance program. It is not     intended to diagnose MRSA     infection nor to guide or     monitor treatment for     MRSA infections.  TROPONIN I     Status: None   Collection Time    04/19/14 12:35 AM      Result Value Ref Range   Troponin I <0.30  <0.30 ng/mL   Comment:            Due to the release kinetics of cTnI,     a negative result within the first hours     of the onset of symptoms does not rule out     myocardial infarction with certainty.     If myocardial infarction is still suspected,     repeat the test at appropriate intervals.  COMPREHENSIVE METABOLIC PANEL     Status: Abnormal   Collection Time    04/19/14 12:35 AM      Result Value Ref Range   Sodium 142  137 - 147 mEq/L   Potassium 3.7  3.7 - 5.3 mEq/L   Chloride 105  96 - 112 mEq/L   CO2 26  19 - 32 mEq/L   Glucose, Bld 112 (*) 70 - 99 mg/dL   BUN 15  6 - 23 mg/dL   Creatinine, Ser 0.85  0.50 - 1.10 mg/dL   Calcium 8.7  8.4 - 10.5 mg/dL   Total Protein 5.4 (*) 6.0 - 8.3 g/dL   Albumin 2.8 (*) 3.5 - 5.2 g/dL   AST 13  0 - 37 U/L   ALT 9  0 - 35 U/L   Alkaline Phosphatase 62  39 - 117 U/L   Total Bilirubin <0.2 (*) 0.3 - 1.2 mg/dL   GFR calc non Af Amer 69 (*) >90 mL/min   GFR calc Af Amer 80 (*) >90 mL/min   Comment: (NOTE)     The eGFR has been calculated using the CKD EPI equation.     This calculation has not been validated in all clinical situations.     eGFR's persistently <90 mL/min signify possible Chronic Kidney     Disease.   Anion gap 11  5 - 15  LIPID PANEL     Status: None   Collection Time    04/19/14 12:35 AM      Result Value Ref Range   Cholesterol 137  0 - 200 mg/dL   Triglycerides 106  <150 mg/dL   HDL 79  >39 mg/dL   Total CHOL/HDL Ratio 1.7     VLDL 21  0 - 40 mg/dL   LDL Cholesterol 37  0 - 99 mg/dL   Comment:            Total Cholesterol/HDL:CHD  Risk     Coronary Heart Disease Risk Table                         Men   Women      1/2 Average Risk   3.4   3.3      Average Risk       5.0   4.4      2 X Average Risk   9.6   7.1      3 X  Average Risk  23.4   11.0                Use the calculated Patient Ratio     above and the CHD Risk Table     to determine the patient's CHD Risk.                ATP III CLASSIFICATION (LDL):      <100     mg/dL   Optimal      100-129  mg/dL   Near or Above                        Optimal      130-159  mg/dL   Borderline      160-189  mg/dL   High      >190     mg/dL   Very High  TROPONIN I     Status: None   Collection Time    04/19/14  8:40 AM      Result Value Ref Range   Troponin I <0.30  <0.30 ng/mL   Comment:            Due to the release kinetics of cTnI,     a negative result within the first hours     of the onset of symptoms does not rule out     myocardial infarction with certainty.     If myocardial infarction is still suspected,     repeat the test at appropriate intervals.    Imaging: Dg Chest Port 1 View  04/18/2014   CLINICAL DATA:  Chest pain.  EXAM: PORTABLE CHEST - 1 VIEW  COMPARISON:  09/21/2013  FINDINGS: The heart size and mediastinal contours are within normal limits. Both lungs are clear. The visualized skeletal structures are unremarkable. Postoperative changes in the cervical spine.  IMPRESSION: No active disease.   Electronically Signed   By: Lucienne Capers M.D.   On: 04/18/2014 05:09    Assessment:  Principal Problem:   Atypical chest pain Active Problems:   HTN (hypertension)   GERD (gastroesophageal reflux disease)   Coronary artery spasm, wth continued episodes of chest pain.    H/O hiatal hernia   Plan:  1. I do not think this is cardiac chest pain. Whether this is coronary spasm or not is debatable. She really isn't on standard treatment for coronary spasm. Her bradycardia will not allow b-blocker, however, would recommend adding amlodipine 5 mg.  Restart home Imdur dose and d/c nitropaste. Increase Ranexa to 1000 mg BID. Increase protonix to 40 mg BID. May need outpatient GI evaluation. Will add percocet for pain/headache - decrease tylenol prn dose. Continue monitoring in stepdown today, if improved symptoms tomorrow, may be able to discharge.  Time Spent Directly with Patient:  15 minutes  Length of Stay:  LOS: 1 day   Pixie Casino, MD, [facility] Attending Cardiologist [facility]  HILTY,Kenneth C 04/19/2014, 11:09 AM

## 2014-04-20 ENCOUNTER — Encounter (HOSPITAL_COMMUNITY): Payer: Self-pay | Admitting: Cardiology

## 2014-04-20 DIAGNOSIS — F3289 Other specified depressive episodes: Secondary | ICD-10-CM

## 2014-04-20 DIAGNOSIS — K219 Gastro-esophageal reflux disease without esophagitis: Secondary | ICD-10-CM | POA: Diagnosis not present

## 2014-04-20 DIAGNOSIS — I10 Essential (primary) hypertension: Secondary | ICD-10-CM | POA: Diagnosis not present

## 2014-04-20 DIAGNOSIS — E785 Hyperlipidemia, unspecified: Secondary | ICD-10-CM | POA: Diagnosis not present

## 2014-04-20 DIAGNOSIS — F329 Major depressive disorder, single episode, unspecified: Secondary | ICD-10-CM

## 2014-04-20 DIAGNOSIS — R0789 Other chest pain: Secondary | ICD-10-CM | POA: Diagnosis not present

## 2014-04-20 DIAGNOSIS — R739 Hyperglycemia, unspecified: Secondary | ICD-10-CM | POA: Diagnosis present

## 2014-04-20 MED ORDER — AMLODIPINE BESYLATE 2.5 MG PO TABS: 2.5000 mg | ORAL_TABLET | Freq: Every day | ORAL | Status: DC

## 2014-04-20 MED ORDER — PANTOPRAZOLE SODIUM 40 MG PO TBEC: 40.0000 mg | DELAYED_RELEASE_TABLET | Freq: Two times a day (BID) | ORAL | Status: AC

## 2014-04-20 MED ORDER — RANOLAZINE ER 1000 MG PO TB12: 1000.0000 mg | ORAL_TABLET | Freq: Two times a day (BID) | ORAL | Status: DC

## 2014-04-20 NOTE — Discharge Instructions (Signed)
Follow up with your PCP this week or next to further evaluate.   Heart healthy low sugar diet.  Your long acting glucose is elevated and will be addressed by your PCP.  We increased your protonix and ranexa.  We added amlodipine as well.

## 2014-04-20 NOTE — Discharge Summary (Signed)
Physician Discharge Summary       Patient ID: [name] MRN: [id number] DOB/AGE: 1945/11/11 68 y.o.  Admit date: 04/18/2014 Discharge date: 04/20/2014  Discharge Diagnoses:  Principal Problem:   Atypical chest pain Active Problems:   HTN (hypertension)   GERD (gastroesophageal reflux disease)   Dyslipidemia   Coronary artery spasm, wth continued episodes of chest pain.    H/O hiatal hernia   Hyperglycemia   Discharged Condition: good  Procedures: none  Hospital Course:   68 yo female w/ hx of CAD s/p stenting RCA and PTCA of LAD and component of coronary spasm, HTN, HLD, GERD, anxiety, NSVT, hx of tobacco abuse who presented 9/8 with chest pain. She has a history of atypical chest pain of uncertain etiology thought to be GI versus coronary vasospasm.She was admitted in 02/2013 and cath at that time revealed generalized left coronary vasoconstriction relieved with intracoronary nitroglycerin, 40% proximal LAD an eccentric 50% diagonal #1, widely patent circumflex and RCA including the RCA stent and low normal LV systolic function. ECHO 02/2013 revealed low normal EF 50- 55% with moderate hypokinesis of entire inferoposterior myocardium, grade 2 diastolic dysfunction and mild MR. Admitted 2/15 for NSTEMI. Seen in the office on 03/21/2014 and was noted to be feeling well with no prolonged episodes of chest pain.   Her CP started 04/17/14 in the morning at around 8:30am and worsened intermittently throughout the day. Describes the pain as a pressure in her left chest that radiates to her back, lasts about an hour and is alleviated by one SL nitro. Pain is a 10/10 at worse and 8/10 after SL nitro. Associated diaphoresis and dizziness. States if she does not lay down with these episodes it feels like she will faint. This pain feels like her previous chest pain from her last hospital admission in 2/15, but worse. No palpitations, shortness of breath, syncope, or leg swelling. Describes orthopnea  that is long-standing with rare PND. Has a burning in her throat that was not alleviated by Maalox today. In the ED she received ASA 324m , morphine 479mx 2, Percocet x 1. Remains at a 8/10.  Ms. WeSedlarlso has had intermittent headaches for the past three weeks that wax and wane throughout the day. Describes them as a pressure throughout and "like something is rattling inside her head when it moves". At worse they are a 10/10 and not alleviated by Aleve. No sensitivity to light or sounds. Some associated nausea, but no vomiting. No changes in vision or vertigo.  She was admitted rule out MI.  Her initial K+ was elevated, due to hemolysis, follow up with new blood was normal.  Her enzymes are negative for MI.  She continues with pain despite treatment.  Norvasc added to help prevent possible spasm- though BP did not tolerate 5 mg daily, she is on 2.5 mg daily.  She was bradycardic and we were unable to add BB.  We also increased ranexa to 1000 mg.  We also increased her protonix to BID.  Despite changes she continued with pain.  She was seen by Dr. HiDebara Pickettday of discharge who found her stable for discharge, he does not believe the pain to be cardiac.  Most likely musculoskeletal or chest wall pain, possibly fibromyalgia.  He believes rest of work up can be accomplished as outpt with her PCP-also her hgbA1C is elevated at 6.3.   She will follow up with cardiology in next couple of weeks.    Consults: None  Significant Diagnostic Studies:  BMET    Component Value Date/Time   NA 142 04/19/2014 0035   K 3.7 04/19/2014 0035   CL 105 04/19/2014 0035   CO2 26 04/19/2014 0035   GLUCOSE 112* 04/19/2014 0035   BUN 15 04/19/2014 0035   CREATININE 0.85 04/19/2014 0035   CALCIUM 8.7 04/19/2014 0035   GFRNONAA 69* 04/19/2014 0035   GFRAA 80* 04/19/2014 0035    CBC    Component Value Date/Time   WBC 6.3 04/18/2014 0445   RBC 4.21 04/18/2014 0445   HGB 14.6 04/18/2014 0636   HGB 14.6 04/18/2014 0636   HCT 43.0 04/18/2014  0636   HCT 43.0 04/18/2014 0636   PLT 250 04/18/2014 0445   MCV 94.3 04/18/2014 0445   MCH 32.8 04/18/2014 0445   MCHC 34.8 04/18/2014 0445   RDW 14.1 04/18/2014 0445   LYMPHSABS 2.4 12/08/2011 0720   MONOABS 0.5 12/08/2011 0720   EOSABS 0.1 12/08/2011 0720   BASOSABS 0.0 12/08/2011 0720    Troponin I <0.30 X 3   Hepatic Function Latest Ref Rng 04/19/2014 09/22/2013 12/09/2011  Total Protein 6.0 - 8.3 g/dL 5.4(L) 6.1 6.2  Albumin 3.5 - 5.2 g/dL 2.8(L) 3.2(L) 3.3(L)  AST 0 - 37 U/L '13 23 15  ' ALT 0 - 35 U/L '9 11 13  ' Alk Phosphatase 39 - 117 U/L 62 77 112  Total Bilirubin 0.3 - 1.2 mg/dL <0.2(L) 0.2(L) 0.3  Bilirubin, Direct 0.0 - 0.3 mg/dL - - -   Lipid Panel     Component Value Date/Time   CHOL 137 04/19/2014 0035   TRIG 106 04/19/2014 0035   HDL 79 04/19/2014 0035   CHOLHDL 1.7 04/19/2014 0035   VLDL 21 04/19/2014 0035   LDLCALC 37 04/19/2014 0035   HgB A1C 6.3  PCXR: PORTABLE CHEST - 1 VIEW  COMPARISON: 09/21/2013  FINDINGS:  The heart size and mediastinal contours are within normal limits.  Both lungs are clear. The visualized skeletal structures are  unremarkable. Postoperative changes in the cervical spine.  IMPRESSION:  No active disease.    Discharge Exam: Blood pressure 96/56, pulse 47, temperature 97.8 F (36.6 C), temperature source Oral, resp. rate 16, height '4\' 11"'  (1.499 m), weight 123 lb 10.9 oz (56.1 kg), SpO2 98.00%.    Disposition: 01-Home or Self Care     Medication List         aluminum-magnesium hydroxide-simethicone 932-355-73 MG/5ML Susp  Commonly known as:  MAALOX  Take 30 mLs by mouth 3 (three) times daily as needed. After meals for indigestion     amLODipine 2.5 MG tablet  Commonly known as:  NORVASC  Take 1 tablet (2.5 mg total) by mouth daily.  Start taking on:  04/21/2014     aspirin 81 MG chewable tablet  Chew 81 mg by mouth daily.     calcium-vitamin D 500-200 MG-UNIT per tablet  Commonly known as:  OSCAL WITH D  Take 1 tablet by mouth 2 (two) times  daily.     citalopram 20 MG tablet  Commonly known as:  CELEXA  Take 20 mg by mouth daily.     clonazePAM 0.5 MG tablet  Commonly known as:  KLONOPIN  Take 0.5 mg by mouth 2 (two) times daily as needed. For anxiety     ferrous sulfate 325 (65 FE) MG tablet  Take 325 mg by mouth daily with breakfast.     isosorbide mononitrate 120 MG 24 hr tablet  Commonly known as:  IMDUR  Take 120 mg by mouth daily.     NITROSTAT 0.4 MG SL tablet  Generic drug:  nitroGLYCERIN  DISSOLVE 1 TABLET UNDER TONGUE AS NEEDED     pantoprazole 40 MG tablet  Commonly known as:  PROTONIX  Take 1 tablet (40 mg total) by mouth 2 (two) times daily.     ramipril 2.5 MG capsule  Commonly known as:  ALTACE  Take 2.5 mg by mouth daily.     ranitidine 150 MG tablet  Commonly known as:  ZANTAC  Take 150 mg by mouth 2 (two) times daily.     ranolazine 1000 MG SR tablet  Commonly known as:  RANEXA  Take 1 tablet (1,000 mg total) by mouth 2 (two) times daily.     simvastatin 40 MG tablet  Commonly known as:  ZOCOR  Take 40 mg by mouth every evening.     temazepam 15 MG capsule  Commonly known as:  RESTORIL  Take 15 mg by mouth at bedtime as needed for sleep.     tiZANidine 4 MG tablet  Commonly known as:  ZANAFLEX  Take 4 mg by mouth every 6 (six) hours as needed for muscle spasms.        Discharge Instructions: Follow up with your PCP this week or next to further evaluate.   Heart healthy low sugar diet.  Your long acting glucose is elevated and will be addressed by your PCP.  We increased your protonix and ranexa.  We added amlodipine as well.   Signed: Isaiah Serge Nurse Practitioner-Certified [facility]: [facility] 04/20/2014, 12:52 PM  Time spent on discharge : >30 minutes.

## 2014-04-20 NOTE — Progress Notes (Signed)
DAILY PROGRESS NOTE  Subjective:  Still complains of chest discomfort today and headache. Despite change in medications, there has been no significant change in her symptoms. CE's are negative. BP is lower today.   Objective:  Temp:  [97.4 F (36.3 C)-97.9 F (36.6 C)] 97.8 F (36.6 C) (09/10 1141) Pulse Rate:  [47-60] 47 (09/10 0727) Resp:  [12-22] 16 (09/10 1141) BP: (81-124)/(37-62) 96/56 mmHg (09/10 1141) SpO2:  [96 %-99 %] 98 % (09/10 1141) Weight:  [123 lb 10.9 oz (56.1 kg)] 123 lb 10.9 oz (56.1 kg) (09/10 0300) Weight change: 2.9 oz (0.081 kg)  Intake/Output from previous day: 09/09 0701 - 09/10 0700 In: 600 [P.O.:600] Out: 850 [Urine:850]  Intake/Output from this shift: Total I/O In: 300 [P.O.:300] Out: 300 [Urine:300]  Medications: Current Facility-Administered Medications  Medication Dose Route Frequency Provider Last Rate Last Dose  . acetaminophen (TYLENOL) tablet 500 mg  500 mg Oral Q6H PRN Pixie Casino, MD      . ALPRAZolam Duanne Moron) tablet 0.25 mg  0.25 mg Oral BID PRN Evelene Croon Barrett, PA-C      . alum & mag hydroxide-simeth (MAALOX/MYLANTA) 200-200-20 MG/5ML suspension 30 mL  30 mL Oral Q8H PRN Minus Breeding, MD      . amLODipine (NORVASC) tablet 5 mg  5 mg Oral Daily Pixie Casino, MD   5 mg at 04/20/14 0946  . aspirin chewable tablet 81 mg  81 mg Oral Daily Evelene Croon Barrett, PA-C   81 mg at 04/20/14 0944  . atorvastatin (LIPITOR) tablet 20 mg  20 mg Oral q1800 Minus Breeding, MD   20 mg at 04/19/14 1812  . calcium-vitamin D (OSCAL WITH D) 500-200 MG-UNIT per tablet 1 tablet  1 tablet Oral BID Evelene Croon Barrett, PA-C   1 tablet at 04/20/14 0946  . citalopram (CELEXA) tablet 20 mg  20 mg Oral Daily Rhonda G Barrett, PA-C   20 mg at 04/20/14 0945  . clonazePAM (KLONOPIN) tablet 0.5 mg  0.5 mg Oral BID PRN Evelene Croon Barrett, PA-C   0.5 mg at 04/18/14 2024  . enoxaparin (LOVENOX) injection 40 mg  40 mg Subcutaneous Q24H Rhonda G Barrett, PA-C   40 mg at  04/19/14 2151  . famotidine (PEPCID) tablet 20 mg  20 mg Oral BID Evelene Croon Barrett, PA-C   20 mg at 04/20/14 0945  . ferrous sulfate tablet 325 mg  325 mg Oral Q breakfast Evelene Croon Barrett, PA-C   325 mg at 04/20/14 0824  . isosorbide mononitrate (IMDUR) 24 hr tablet 120 mg  120 mg Oral Daily Pixie Casino, MD   120 mg at 04/20/14 0945  . nitroGLYCERIN (NITROSTAT) SL tablet 0.4 mg  0.4 mg Sublingual Q5 Min x 3 PRN Rhonda G Barrett, PA-C      . ondansetron (ZOFRAN) injection 4 mg  4 mg Intravenous Q6H PRN Rhonda G Barrett, PA-C      . oxyCODONE-acetaminophen (PERCOCET/ROXICET) 5-325 MG per tablet 1 tablet  1 tablet Oral Q6H PRN Pixie Casino, MD   1 tablet at 04/19/14 1812  . pantoprazole (PROTONIX) EC tablet 40 mg  40 mg Oral BID Pixie Casino, MD   40 mg at 04/20/14 0946  . ramipril (ALTACE) capsule 2.5 mg  2.5 mg Oral Daily Rhonda G Barrett, PA-C   2.5 mg at 04/20/14 0945  . ranolazine (RANEXA) 12 hr tablet 1,000 mg  1,000 mg Oral BID Pixie Casino, MD   1,000 mg at  04/20/14 0945  . temazepam (RESTORIL) capsule 15 mg  15 mg Oral QHS PRN Evelene Croon Barrett, PA-C   15 mg at 04/18/14 2254  . tiZANidine (ZANAFLEX) tablet 4 mg  4 mg Oral Q6H PRN Evelene Croon Barrett, PA-C   4 mg at 04/20/14 0432  . zolpidem (AMBIEN) tablet 5 mg  5 mg Oral QHS PRN Lonn Georgia, PA-C        Physical Exam: General appearance: alert and mild distress Neck: no carotid bruit and no JVD Lungs: clear to auscultation bilaterally Heart: regular rate and rhythm, S1, S2 normal, no murmur, click, rub or gallop Abdomen: soft, non-tender; bowel sounds normal; no masses,  no organomegaly Extremities: extremities normal, atraumatic, no cyanosis or edema Pulses: 2+ and symmetric Skin: Skin color, texture, turgor normal. No rashes or lesions Neurologic: Mental status: Alert, oriented, thought content appropriate Psych: Appears uncomfortable  Lab Results: Results for orders placed during the hospital encounter of  04/18/14 (from the past 48 hour(s))  TROPONIN I     Status: None   Collection Time    04/18/14  6:10 PM      Result Value Ref Range   Troponin I <0.30  <0.30 ng/mL   Comment:            Due to the release kinetics of cTnI,     a negative result within the first hours     of the onset of symptoms does not rule out     myocardial infarction with certainty.     If myocardial infarction is still suspected,     repeat the test at appropriate intervals.  MRSA PCR SCREENING     Status: None   Collection Time    04/18/14  8:12 PM      Result Value Ref Range   MRSA by PCR NEGATIVE  NEGATIVE   Comment:            The GeneXpert MRSA Assay (FDA     approved for NASAL specimens     only), is one component of a     comprehensive MRSA colonization     surveillance program. It is not     intended to diagnose MRSA     infection nor to guide or     monitor treatment for     MRSA infections.  TROPONIN I     Status: None   Collection Time    04/19/14 12:35 AM      Result Value Ref Range   Troponin I <0.30  <0.30 ng/mL   Comment:            Due to the release kinetics of cTnI,     a negative result within the first hours     of the onset of symptoms does not rule out     myocardial infarction with certainty.     If myocardial infarction is still suspected,     repeat the test at appropriate intervals.  COMPREHENSIVE METABOLIC PANEL     Status: Abnormal   Collection Time    04/19/14 12:35 AM      Result Value Ref Range   Sodium 142  137 - 147 mEq/L   Potassium 3.7  3.7 - 5.3 mEq/L   Chloride 105  96 - 112 mEq/L   CO2 26  19 - 32 mEq/L   Glucose, Bld 112 (*) 70 - 99 mg/dL   BUN 15  6 - 23 mg/dL   Creatinine, Ser 0.85  0.50 - 1.10 mg/dL   Calcium 8.7  8.4 - 10.5 mg/dL   Total Protein 5.4 (*) 6.0 - 8.3 g/dL   Albumin 2.8 (*) 3.5 - 5.2 g/dL   AST 13  0 - 37 U/L   ALT 9  0 - 35 U/L   Alkaline Phosphatase 62  39 - 117 U/L   Total Bilirubin <0.2 (*) 0.3 - 1.2 mg/dL   GFR calc non Af Amer  69 (*) >90 mL/min   GFR calc Af Amer 80 (*) >90 mL/min   Comment: (NOTE)     The eGFR has been calculated using the CKD EPI equation.     This calculation has not been validated in all clinical situations.     eGFR's persistently <90 mL/min signify possible Chronic Kidney     Disease.   Anion gap 11  5 - 15  LIPID PANEL     Status: None   Collection Time    04/19/14 12:35 AM      Result Value Ref Range   Cholesterol 137  0 - 200 mg/dL   Triglycerides 106  <150 mg/dL   HDL 79  >39 mg/dL   Total CHOL/HDL Ratio 1.7     VLDL 21  0 - 40 mg/dL   LDL Cholesterol 37  0 - 99 mg/dL   Comment:            Total Cholesterol/HDL:CHD Risk     Coronary Heart Disease Risk Table                         Men   Women      1/2 Average Risk   3.4   3.3      Average Risk       5.0   4.4      2 X Average Risk   9.6   7.1      3 X Average Risk  23.4   11.0                Use the calculated Patient Ratio     above and the CHD Risk Table     to determine the patient's CHD Risk.                ATP III CLASSIFICATION (LDL):      <100     mg/dL   Optimal      100-129  mg/dL   Near or Above                        Optimal      130-159  mg/dL   Borderline      160-189  mg/dL   High      >190     mg/dL   Very High  HEMOGLOBIN A1C     Status: Abnormal   Collection Time    04/19/14 12:35 AM      Result Value Ref Range   Hemoglobin A1C 6.3 (*) <5.7 %   Comment: (NOTE)  According to the ADA Clinical Practice Recommendations for 2011, when     HbA1c is used as a screening test:      >=6.5%   Diagnostic of Diabetes Mellitus               (if abnormal result is confirmed)     5.7-6.4%   Increased risk of developing Diabetes Mellitus     References:Diagnosis and Classification of Diabetes Mellitus,Diabetes     IXBO,4784,12(KSKSH 1):S62-S69 and Standards of Medical Care in             Diabetes - 2011,Diabetes NGIT,1959,74 (Suppl  1):S11-S61.   Mean Plasma Glucose 134 (*) <117 mg/dL   Comment: Performed at [facility]  TROPONIN I     Status: None   Collection Time    04/19/14  8:40 AM      Result Value Ref Range   Troponin I <0.30  <0.30 ng/mL   Comment:            Due to the release kinetics of cTnI,     a negative result within the first hours     of the onset of symptoms does not rule out     myocardial infarction with certainty.     If myocardial infarction is still suspected,     repeat the test at appropriate intervals.    Imaging: No results found.  Assessment:  Principal Problem:   Atypical chest pain Active Problems:   HTN (hypertension)   GERD (gastroesophageal reflux disease)   Coronary artery spasm, wth continued episodes of chest pain.    H/O hiatal hernia   Plan:  1. I do not think this is cardiac chest pain. I suspect musculoskeletal or chest wall pain, ?fibromyalgia. No improvement in her symptoms with medication changes overnight. BP lower, so will need to reduce the dose of her amlodipine. She appears stable from a vital sign standpoint. I think she can have outpatient work-up of her pain. She reports no improvement with percocet, so I would not discharge her with opioid medications.  Should follow-up in the next week with her PCP and eventually with Dr. Pernell Dupre.  [facility] for discharge today.  Time Spent Directly with Patient:  15 minutes  Length of Stay:  LOS: 2 days   Pixie Casino, MD, [facility] Attending Cardiologist [facility]  HILTY,Kenneth C 04/20/2014, 11:58 AM

## 2014-05-02 ENCOUNTER — Encounter: Payer: Self-pay | Admitting: *Deleted

## 2014-05-04 ENCOUNTER — Ambulatory Visit (INDEPENDENT_AMBULATORY_CARE_PROVIDER_SITE_OTHER): Payer: Medicare Other | Admitting: Physician Assistant

## 2014-05-04 ENCOUNTER — Encounter: Payer: Self-pay | Admitting: Physician Assistant

## 2014-05-04 ENCOUNTER — Ambulatory Visit
Admission: RE | Admit: 2014-05-04 | Discharge: 2014-05-04 | Disposition: A | Discharge status: Home / Self Care | Payer: Medicare Other | Source: Ambulatory Visit | Attending: Physician Assistant | Admitting: Physician Assistant

## 2014-05-04 ENCOUNTER — Telehealth: Payer: Self-pay | Admitting: *Deleted

## 2014-05-04 VITALS — BP 117/68 | HR 86 | Ht 59.0 in | Wt 124.0 lb

## 2014-05-04 DIAGNOSIS — I201 Angina pectoris with documented spasm: Secondary | ICD-10-CM

## 2014-05-04 DIAGNOSIS — I2510 Atherosclerotic heart disease of native coronary artery without angina pectoris: Secondary | ICD-10-CM

## 2014-05-04 DIAGNOSIS — I209 Angina pectoris, unspecified: Secondary | ICD-10-CM

## 2014-05-04 DIAGNOSIS — R079 Chest pain, unspecified: Secondary | ICD-10-CM

## 2014-05-04 DIAGNOSIS — K219 Gastro-esophageal reflux disease without esophagitis: Secondary | ICD-10-CM

## 2014-05-04 DIAGNOSIS — E785 Hyperlipidemia, unspecified: Secondary | ICD-10-CM

## 2014-05-04 DIAGNOSIS — I10 Essential (primary) hypertension: Secondary | ICD-10-CM

## 2014-05-04 DIAGNOSIS — K625 Hemorrhage of anus and rectum: Secondary | ICD-10-CM

## 2014-05-04 MED ORDER — SIMVASTATIN 20 MG PO TABS: 20.0000 mg | ORAL_TABLET | Freq: Every evening | ORAL | Status: DC

## 2014-05-04 NOTE — Patient Instructions (Signed)
Your physician has recommended you make the following change in your medication:  1. DECREASE SIMVASTATIN 20 MG 1 TABLET EVERY NIGHT  LAB WORK TODAY; D-DIMER  A chest x-ray takes a picture of the organs and structures inside the chest, including the heart, lungs, and blood vessels. This test can show several things, including, whether the heart is enlarges; whether fluid is building up in the lungs; and whether pacemaker / defibrillator leads are still in place.  You have been referred to DR. MANN WITH GI; DX GERD, RECTAL BLEEDING, CHEST PAIN  Your physician wants you to follow-up in: [place] DR. Gaspar Bidding will receive a reminder letter in the mail two months in advance. If you don't receive a letter, please call our office to schedule the follow-up appointment.

## 2014-05-04 NOTE — Progress Notes (Signed)
Cardiology Office Note    Date:  05/04/2014   ID:  [name], DOB 13-Dec-1945, MRN [id number]  PCP:  [name], MD  Cardiologist:  Dr. Daneen Schick     History of Present Illness: [name] is a 68 y.o. female with a hx of CAD s/p remote stenting to RCA and PTCA of LAD, GERD, HLD, HTN and anxiety.  Patient has had multiple cardiac catheterizations without significant stenosis.  Last cath in 2014 with non-obs CAD and evidence of vasospasm.  Admitted 09/2013 with NSTEMI (peak Troponin 2.3).  NSTEMI was felt to be 2/2 spasm.  Ranexa dose was increased.  Admitted 9/8-9/10 with chest and epigastric pain.  Patient reported BRBPR.  CEs remained neg.  Medications were adjusted for possible spasm without change in symptoms.  Pain was felt to be atypical.  No further workup was recommended.  She returns for FU.    She is feeling better since discharge. She's had much less chest discomfort. She does have a lot of chest discomfort after eating meals. She also notes odynophagia as well as dysphagia. She notes chronic dyspnea with exertion. She is NYHA 2b.  She sleeps on 4 pillows chronically. She denies any recent change. She denies PND or LE edema. She denies syncope. She has noted some dizziness with standing in the morning. She has noted recent pleuritic chest pain and left thoracic area over the last 2 days.   Studies:  - LHC (7/14):  prox LAD 40%, ostial D1 50%, EF 50%; generalized L coronary vasoconstriction relieve with IC NTG  - Echo (7/14):  EF 50-55%, mod inf-post HK, Gr 2 DD, mild MR.   - Nuclear (6/05):  Lat scar, no ischemia, EF 35%   Recent Labs/Images:  09/21/2013: Pro B Natriuretic peptide (BNP) 99.1; TSH 1.760  04/18/2014: Hemoglobin 14.6; Hemoglobin 14.6; WBC 6.3  04/19/2014: ALT 9; AST 13; BUN 15; Creatinine 0.85; HDL Cholesterol by NMR 79; LDL (calc) 37; Potassium 3.7; Sodium 142    Dg Chest Port 1 View  04/18/2014     IMPRESSION: No active disease.   Electronically  Signed   By: Lucienne Capers M.D.   On: 04/18/2014 05:09     Wt Readings from Last 3 Encounters:  05/04/14 124 lb (56.246 kg)  04/20/14 123 lb 10.9 oz (56.1 kg)  03/21/14 126 lb (57.153 kg)     Past Medical History  Diagnosis Date  . Hypertension   . Hypercholesteremia   . GERD (gastroesophageal reflux disease)   . Anxiety   . Hiatal hernia   . Anemia   . Blood transfusion   . Coronary artery disease   . Coronary vasospasm   . NSVT (nonsustained ventricular tachycardia) 09/26/2013  . Coronary artery spasm, wth continued episodes of chest pain.  09/26/2013  . NSTEMI (non-ST elevated myocardial infarction)     Current Outpatient Prescriptions  Medication Sig Dispense Refill  . aluminum-magnesium hydroxide-simethicone (MAALOX) [phone] MG/5ML SUSP Take 30 mLs by mouth 3 (three) times daily as needed. After meals for indigestion      . amLODipine (NORVASC) 2.5 MG tablet Take 1 tablet (2.5 mg total) by mouth daily.  30 tablet  6  . aspirin EC 81 MG tablet Take 81 mg by mouth daily.      . calcium-vitamin D (OSCAL WITH D) 500-200 MG-UNIT per tablet Take 1 tablet by mouth 2 (two) times daily.      . citalopram (CELEXA) 20 MG tablet Take 20 mg by mouth daily.      Marland Kitchen  clonazePAM (KLONOPIN) 0.5 MG tablet Take 0.5 mg by mouth 2 (two) times daily as needed. For anxiety      . ferrous sulfate 325 (65 FE) MG tablet Take 325 mg by mouth daily with breakfast.      . isosorbide mononitrate (IMDUR) 120 MG 24 hr tablet Take 120 mg by mouth daily.      Marland Kitchen NITROSTAT 0.4 MG SL tablet DISSOLVE 1 TABLET UNDER TONGUE AS NEEDED  25 tablet  0  . pantoprazole (PROTONIX) 40 MG tablet Take 1 tablet (40 mg total) by mouth 2 (two) times daily.  60 tablet  2  . ramipril (ALTACE) 2.5 MG capsule Take 2.5 mg by mouth daily.       . ranitidine (ZANTAC) 150 MG tablet Take 150 mg by mouth 2 (two) times daily.      . ranolazine (RANEXA) 1000 MG SR tablet Take 1 tablet (1,000 mg total) by mouth 2 (two) times daily.  60  tablet  6  . simvastatin (ZOCOR) 40 MG tablet Take 40 mg by mouth every evening.      . temazepam (RESTORIL) 15 MG capsule Take 15 mg by mouth at bedtime as needed for sleep.       Marland Kitchen tiZANidine (ZANAFLEX) 4 MG tablet Take 4 mg by mouth every 6 (six) hours as needed for muscle spasms.        No current facility-administered medications for this visit.     Allergies:   Review of patient's allergies indicates no known allergies.   Social History:  The patient  reports that she quit smoking about 2 years ago. Her smoking use included Cigarettes. She has a 40 pack-year smoking history. She has quit using smokeless tobacco. She reports that she drinks about .6 ounces of alcohol per week. She reports that she does not use illicit drugs.   Family History:  The patient's family history includes Heart attack in an other family member; Stroke in her sister. There is no history of Heart attack.   ROS:  Please see the history of present illness.   Patient does note a recent history of bright red blood per rectum. She also notes dysphagia and odynophagia. She denies weight loss.  All other systems reviewed and negative.    PHYSICAL EXAM: VS:  BP 117/68  Pulse 86  Ht 4\' 11"  (1.499 m)  Wt 124 lb (56.246 kg)  BMI 25.03 kg/m2 Well nourished, well developed, in no acute distress HEENT: normal Neck:  no JVD Cardiac:  normal S1, S2;  RRR; no murmur Lungs:   clear to auscultation bilaterally, no wheezing, rhonchi or rales Abd: soft, nontender, no hepatomegaly Ext:  no edema Skin: warm and dry Neuro:  CNs 2-12 intact, no focal abnormalities noted  EKG:  NSR, HR 86, normal axis, nonspecific ST-T wave changes      ASSESSMENT AND PLAN:  1. Chest pain: She has atypical chest pain. Symptoms are overall stable. She is suspected to have coronary vasospasm. Most recent cardiac catheterization demonstrated nonobstructive CAD. Cardiac enzymes remained normal during recent admission to the hospital. She does  have some pleuritic chest pain. With a recent admission to the hospital, have recommended proceeding with chest x-ray and d-dimer. If d-dimer is elevated, she will need a chest CTA. She has symptoms that sound consistent with acid reflux disease. She is already on maximum dose PPI. Refer to gastroenterology. 2. Coronary artery disease: Overall stable. She also has a history of coronary vasospasm.  Continue combination of  amlodipine, nitrates, Ranexa, statin. 3. Hypertension: She has had some episodes of dizziness with standing. If these continue, I have recommended that she stop Altace. 4. Rectal bleeding: Refer to gastroenterology. 5. GERD: Continue PPI. Refer to gastroenterology. 6. Hyperlipidemia: With concomitant use of amlodipine and Ranexa, decrease simvastatin to 20 mg daily.  Lipids are managed by primary care.   Disposition:    FU with Dr. Tamala Julian 09/2014.Weston Brass, Versie Starks, MHS 05/04/2014 2:34 PM    [facility] [address], [place], [place]  [address] Phone: [phone]; Fax: [phone]

## 2014-05-04 NOTE — Telephone Encounter (Signed)
lmptcb for CXR results

## 2014-05-05 LAB — D-DIMER, QUANTITATIVE (NOT AT ARMC): D-Dimer, Quant: 0.29 ug/mL-FEU (ref 0.00–0.48)

## 2014-05-05 NOTE — Telephone Encounter (Signed)
Follow up      Pt is returning nurses call to get results

## 2014-05-05 NOTE — Telephone Encounter (Signed)
pt aware of cxr results.No acute findings on chest X-ray  Continue with current treatment plan.pt verbalized understanding.

## 2014-05-08 ENCOUNTER — Telehealth: Payer: Self-pay | Admitting: *Deleted

## 2014-05-08 NOTE — Telephone Encounter (Signed)
pt notified about normal d-dimer results with verbal understanding

## 2014-05-09 ENCOUNTER — Other Ambulatory Visit (HOSPITAL_COMMUNITY): Payer: Self-pay | Admitting: Neurosurgery

## 2014-05-09 ENCOUNTER — Ambulatory Visit (HOSPITAL_COMMUNITY)
Admission: RE | Admit: 2014-05-09 | Discharge: 2014-05-09 | Disposition: A | Discharge status: Home / Self Care | Payer: Medicare Other | Source: Ambulatory Visit | Attending: Neurosurgery | Admitting: Neurosurgery

## 2014-05-09 DIAGNOSIS — M79609 Pain in unspecified limb: Secondary | ICD-10-CM | POA: Diagnosis present

## 2014-05-09 DIAGNOSIS — I739 Peripheral vascular disease, unspecified: Secondary | ICD-10-CM | POA: Diagnosis present

## 2014-05-09 NOTE — Progress Notes (Signed)
VASCULAR LAB PRELIMINARY  ARTERIAL  ABI completed: ABI within normal limits.    RIGHT    LEFT    PRESSURE WAVEFORM  PRESSURE WAVEFORM  BRACHIAL 138 Tri BRACHIAL 132 Tri  DP   DP    AT 153 Tri AT 151 Tri  PT 143 Tri PT 141 Tri  PER   PER    GREAT TOE  NA GREAT TOE  NA    RIGHT LEFT  ABI 1.11 1.09     Landry Mellow, RDMS, RVT  05/09/2014, 1:14 PM

## 2014-05-10 ENCOUNTER — Other Ambulatory Visit: Payer: Self-pay | Admitting: *Deleted

## 2014-05-10 DIAGNOSIS — Z8719 Personal history of other diseases of the digestive system: Secondary | ICD-10-CM

## 2014-05-10 DIAGNOSIS — K219 Gastro-esophageal reflux disease without esophagitis: Secondary | ICD-10-CM

## 2014-05-24 ENCOUNTER — Other Ambulatory Visit: Payer: Self-pay | Admitting: Gastroenterology

## 2014-07-20 ENCOUNTER — Encounter (HOSPITAL_COMMUNITY): Payer: Self-pay | Admitting: Interventional Cardiology

## 2014-07-31 ENCOUNTER — Other Ambulatory Visit: Payer: Self-pay

## 2014-07-31 DIAGNOSIS — Z1231 Encounter for screening mammogram for malignant neoplasm of breast: Secondary | ICD-10-CM

## 2014-08-17 ENCOUNTER — Ambulatory Visit
Admission: RE | Admit: 2014-08-17 | Discharge: 2014-08-17 | Disposition: A | Discharge status: Home / Self Care | Payer: Medicare Other | Source: Ambulatory Visit

## 2014-08-17 ENCOUNTER — Encounter (INDEPENDENT_AMBULATORY_CARE_PROVIDER_SITE_OTHER): Payer: Self-pay

## 2014-08-17 DIAGNOSIS — Z1231 Encounter for screening mammogram for malignant neoplasm of breast: Secondary | ICD-10-CM

## 2014-08-26 ENCOUNTER — Other Ambulatory Visit: Payer: Self-pay | Admitting: Interventional Cardiology

## 2014-10-02 ENCOUNTER — Ambulatory Visit: Payer: Medicare Other | Admitting: Interventional Cardiology

## 2014-10-10 ENCOUNTER — Encounter: Payer: Self-pay | Admitting: Interventional Cardiology

## 2014-10-22 ENCOUNTER — Encounter (HOSPITAL_COMMUNITY): Payer: Self-pay | Admitting: Emergency Medicine

## 2014-10-22 ENCOUNTER — Emergency Department (HOSPITAL_COMMUNITY): Payer: Medicare Other

## 2014-10-22 ENCOUNTER — Observation Stay (HOSPITAL_COMMUNITY)
Admission: EM | Admit: 2014-10-22 | Discharge: 2014-10-23 | Disposition: A | Discharge status: Home / Self Care | Payer: Medicare Other | Attending: Cardiovascular Disease | Admitting: Cardiovascular Disease

## 2014-10-22 DIAGNOSIS — E780 Pure hypercholesterolemia: Secondary | ICD-10-CM | POA: Insufficient documentation

## 2014-10-22 DIAGNOSIS — K219 Gastro-esophageal reflux disease without esophagitis: Secondary | ICD-10-CM | POA: Diagnosis not present

## 2014-10-22 DIAGNOSIS — K449 Diaphragmatic hernia without obstruction or gangrene: Secondary | ICD-10-CM | POA: Diagnosis not present

## 2014-10-22 DIAGNOSIS — D649 Anemia, unspecified: Secondary | ICD-10-CM | POA: Insufficient documentation

## 2014-10-22 DIAGNOSIS — Z7982 Long term (current) use of aspirin: Secondary | ICD-10-CM | POA: Insufficient documentation

## 2014-10-22 DIAGNOSIS — Z9861 Coronary angioplasty status: Secondary | ICD-10-CM | POA: Diagnosis not present

## 2014-10-22 DIAGNOSIS — I472 Ventricular tachycardia: Secondary | ICD-10-CM | POA: Diagnosis not present

## 2014-10-22 DIAGNOSIS — I25119 Atherosclerotic heart disease of native coronary artery with unspecified angina pectoris: Secondary | ICD-10-CM | POA: Diagnosis not present

## 2014-10-22 DIAGNOSIS — R079 Chest pain, unspecified: Secondary | ICD-10-CM | POA: Diagnosis not present

## 2014-10-22 DIAGNOSIS — R55 Syncope and collapse: Principal | ICD-10-CM | POA: Insufficient documentation

## 2014-10-22 DIAGNOSIS — R0789 Other chest pain: Secondary | ICD-10-CM

## 2014-10-22 DIAGNOSIS — R110 Nausea: Secondary | ICD-10-CM | POA: Insufficient documentation

## 2014-10-22 DIAGNOSIS — R197 Diarrhea, unspecified: Secondary | ICD-10-CM | POA: Diagnosis not present

## 2014-10-22 DIAGNOSIS — Z87891 Personal history of nicotine dependence: Secondary | ICD-10-CM | POA: Insufficient documentation

## 2014-10-22 DIAGNOSIS — Z79899 Other long term (current) drug therapy: Secondary | ICD-10-CM | POA: Insufficient documentation

## 2014-10-22 DIAGNOSIS — F419 Anxiety disorder, unspecified: Secondary | ICD-10-CM | POA: Insufficient documentation

## 2014-10-22 DIAGNOSIS — I10 Essential (primary) hypertension: Secondary | ICD-10-CM | POA: Insufficient documentation

## 2014-10-22 DIAGNOSIS — I252 Old myocardial infarction: Secondary | ICD-10-CM | POA: Diagnosis not present

## 2014-10-22 LAB — CBC
HCT: 38.8 % (ref 36.0–46.0)
Hemoglobin: 13.5 g/dL (ref 12.0–15.0)
MCH: 32.1 pg (ref 26.0–34.0)
MCHC: 34.8 g/dL (ref 30.0–36.0)
MCV: 92.4 fL (ref 78.0–100.0)
Platelets: 279 10*3/uL (ref 150–400)
RBC: 4.2 MIL/uL (ref 3.87–5.11)
RDW: 13.7 % (ref 11.5–15.5)
WBC: 5.9 10*3/uL (ref 4.0–10.5)

## 2014-10-22 LAB — BASIC METABOLIC PANEL
Anion gap: 10 (ref 5–15)
BUN: 8 mg/dL (ref 6–23)
CO2: 24 mmol/L (ref 19–32)
Calcium: 9.2 mg/dL (ref 8.4–10.5)
Chloride: 105 mmol/L (ref 96–112)
Creatinine, Ser: 0.66 mg/dL (ref 0.50–1.10)
GFR calc Af Amer: >90 mL/min (ref >90)
GFR calc non Af Amer: 89 mL/min — ABNORMAL LOW (ref >90)
Glucose, Bld: 118 mg/dL — ABNORMAL HIGH (ref 70–99)
Potassium: 4.1 mmol/L (ref 3.5–5.1)
Sodium: 139 mmol/L (ref 135–145)

## 2014-10-22 LAB — I-STAT TROPONIN, ED: Troponin i, poc: 0 ng/mL (ref 0.00–0.08)

## 2014-10-22 MED ORDER — PANTOPRAZOLE SODIUM 40 MG PO TBEC
40.0000 mg | DELAYED_RELEASE_TABLET | Freq: Two times a day (BID) | ORAL | Status: DC
  Administered 2014-10-22 – 2014-10-23 (×2): 40 mg via ORAL
  Filled 2014-10-22: qty 1

## 2014-10-22 MED ORDER — RAMIPRIL 2.5 MG PO CAPS
2.5000 mg | ORAL_CAPSULE | Freq: Every day | ORAL | Status: DC
  Administered 2014-10-22: 2.5 mg via ORAL
  Filled 2014-10-22 (×2): qty 1

## 2014-10-22 MED ORDER — ASPIRIN EC 81 MG PO TBEC
81.0000 mg | DELAYED_RELEASE_TABLET | Freq: Every day | ORAL | Status: DC
  Administered 2014-10-23: 81 mg via ORAL
  Filled 2014-10-22: qty 1

## 2014-10-22 MED ORDER — ALUM & MAG HYDROXIDE-SIMETH 200-200-20 MG/5ML PO SUSP: 30.0000 mL | Freq: Three times a day (TID) | ORAL | Status: DC | PRN

## 2014-10-22 MED ORDER — RANOLAZINE ER 500 MG PO TB12
500.0000 mg | ORAL_TABLET | Freq: Two times a day (BID) | ORAL | Status: DC
  Administered 2014-10-23: 500 mg via ORAL
  Filled 2014-10-22 (×2): qty 1

## 2014-10-22 MED ORDER — ONDANSETRON HCL 4 MG/2ML IJ SOLN: 4.0000 mg | Freq: Four times a day (QID) | INTRAMUSCULAR | Status: DC | PRN

## 2014-10-22 MED ORDER — FERROUS SULFATE 325 (65 FE) MG PO TABS
325.0000 mg | ORAL_TABLET | Freq: Every day | ORAL | Status: DC
  Administered 2014-10-23: 325 mg via ORAL
  Filled 2014-10-22: qty 1

## 2014-10-22 MED ORDER — ASPIRIN 81 MG PO CHEW
324.0000 mg | CHEWABLE_TABLET | Freq: Once | ORAL | Status: AC
  Administered 2014-10-22: 324 mg via ORAL
  Filled 2014-10-22: qty 4

## 2014-10-22 MED ORDER — ACETAMINOPHEN 325 MG PO TABS: 650.0000 mg | ORAL_TABLET | ORAL | Status: DC | PRN

## 2014-10-22 MED ORDER — TEMAZEPAM 15 MG PO CAPS: 15.0000 mg | ORAL_CAPSULE | Freq: Every evening | ORAL | Status: DC | PRN

## 2014-10-22 MED ORDER — SODIUM CHLORIDE 0.9 % IV BOLUS (SEPSIS)
1000.0000 mL | Freq: Once | INTRAVENOUS | Status: AC
  Administered 2014-10-22: 1000 mL via INTRAVENOUS

## 2014-10-22 MED ORDER — ISOSORBIDE MONONITRATE ER 60 MG PO TB24
120.0000 mg | ORAL_TABLET | Freq: Every day | ORAL | Status: DC
  Administered 2014-10-22: 120 mg via ORAL

## 2014-10-22 MED ORDER — SIMVASTATIN 20 MG PO TABS
20.0000 mg | ORAL_TABLET | Freq: Every evening | ORAL | Status: DC
Start: 2014-10-22 — End: 2014-10-23
  Administered 2014-10-22: 20 mg via ORAL

## 2014-10-22 MED ORDER — AMLODIPINE BESYLATE 2.5 MG PO TABS
2.5000 mg | ORAL_TABLET | Freq: Every day | ORAL | Status: DC
  Administered 2014-10-22: 2.5 mg via ORAL

## 2014-10-22 MED ORDER — NITROGLYCERIN 0.4 MG SL SUBL: 0.4000 mg | SUBLINGUAL_TABLET | SUBLINGUAL | Status: DC | PRN

## 2014-10-22 MED ORDER — TRAMADOL HCL 50 MG PO TABS
50.0000 mg | ORAL_TABLET | Freq: Four times a day (QID) | ORAL | Status: DC | PRN
  Administered 2014-10-22: 50 mg via ORAL

## 2014-10-22 NOTE — Progress Notes (Signed)
Notified Dr. Radford Pax about orthostatic hypotension BP's and if I should hold Ranexa. She said to hold Ranexa night time dose.

## 2014-10-22 NOTE — ED Provider Notes (Signed)
CSN: [id number]     Arrival date & time 10/22/14  1249 History   First MD Initiated Contact with Patient 10/22/14 1250     Chief Complaint  Patient presents with  . Loss of Consciousness     (Consider location/radiation/quality/duration/timing/severity/associated sxs/prior Treatment) HPI Comments: Patient with history of coronary artery disease status post stenting, on Imdur, h/o coronary vasospasm, h/o grade II diastolic heart failure with preserved EF -- presents with chest pain and syncopal episode. Patient awoke this morning and developed some chest tightness for which she took one nitroglycerin and laid down with relief of symptoms. She then went to church and was standing and singing when she felt hot and nauseous. She did have some chest tightness at that time. Patient syncopized and EMS was called. EMS was attempting to take orthostatic vital signs and when the patient stood up she passed out again. She was then transported to the hospital. She has not had aspirin. Patient states that typically she will need to take a nitroglycerin every 1-2 weeks, however over the past several weeks she has needed 1-2 nitroglycerin approximately every other day. No palpitations or diaphoresis. She had a couple soft stools this morning. Chest tightness has been occurring at rest. Currently light mid-sternal chest tightness.   Patient is a 69 y.o. female presenting with syncope. The history is provided by the patient.  Loss of Consciousness Associated symptoms: chest pain and nausea   Associated symptoms: no diaphoresis, no fever, no palpitations, no shortness of breath and no vomiting     Past Medical History  Diagnosis Date  . Hypertension   . Hypercholesteremia   . GERD (gastroesophageal reflux disease)   . Anxiety   . Hiatal hernia   . Anemia   . Blood transfusion   . Coronary artery disease   . Coronary vasospasm   . NSVT (nonsustained ventricular tachycardia) 09/26/2013  . Coronary artery  spasm, wth continued episodes of chest pain.  09/26/2013  . NSTEMI (non-ST elevated myocardial infarction)    Past Surgical History  Procedure Laterality Date  . Cardiac stent  2012    to RCA  . Shoulder surgery    . Breast      right, tumor   . Knee surgery  left  . Cardiac catheterization  2014  . Cardiac catheterization  2012  . Left heart catheterization with coronary angiogram N/A 02/22/2013    Procedure: LEFT HEART CATHETERIZATION WITH CORONARY ANGIOGRAM;  Surgeon: [name], MD;  Location: [facility] CATH LAB;  Service: Cardiovascular;  Laterality: N/A;   Family History  Problem Relation Age of Onset  . Stroke Sister   . Heart attack    . Heart attack Neg Hx    History  Substance Use Topics  . Smoking status: Former Smoker -- 1.00 packs/day for 40 years    Types: Cigarettes    Quit date: 10/10/2011  . Smokeless tobacco: Former Systems developer  . Alcohol Use: 0.6 oz/week    1 Cans of beer per week     Comment: occasional   OB History    No data available     Review of Systems  Constitutional: Negative for fever and diaphoresis.  Eyes: Negative for redness.  Respiratory: Negative for cough and shortness of breath.   Cardiovascular: Positive for chest pain and syncope. Negative for palpitations and leg swelling.  Gastrointestinal: Positive for nausea and diarrhea. Negative for vomiting and abdominal pain.  Genitourinary: Negative for dysuria.  Musculoskeletal: Negative for back pain  and neck pain.  Skin: Negative for rash.  Neurological: Negative for syncope and light-headedness.  Psychiatric/Behavioral: The patient is not nervous/anxious.     Allergies  Review of patient's allergies indicates no known allergies.  Home Medications   Prior to Admission medications   Medication Sig Start Date End Date Taking? Authorizing Provider  aluminum-magnesium hydroxide-simethicone (MAALOX) [phone] MG/5ML SUSP Take 30 mLs by mouth 3 (three) times daily as needed. After meals for  indigestion    Historical Provider, MD  amLODipine (NORVASC) 2.5 MG tablet Take 1 tablet (2.5 mg total) by mouth daily. 04/21/14   Isaiah Serge, NP  aspirin EC 81 MG tablet Take 81 mg by mouth daily.    Historical Provider, MD  calcium-vitamin D (OSCAL WITH D) 500-200 MG-UNIT per tablet Take 1 tablet by mouth 2 (two) times daily.    Historical Provider, MD  citalopram (CELEXA) 20 MG tablet Take 20 mg by mouth daily.    Historical Provider, MD  clonazePAM (KLONOPIN) 0.5 MG tablet Take 0.5 mg by mouth 2 (two) times daily as needed. For anxiety    Historical Provider, MD  ferrous sulfate 325 (65 FE) MG tablet Take 325 mg by mouth daily with breakfast.    Historical Provider, MD  isosorbide mononitrate (IMDUR) 120 MG 24 hr tablet Take 120 mg by mouth daily.    Historical Provider, MD  NITROSTAT 0.4 MG SL tablet DISSOLVE ONE TABLET UNDER TONGUE AS NEEDED FOR CHEST PAIN EVERY 5 MINUTES UP TO 3 DOSES 08/28/14   Belva Crome, MD  pantoprazole (PROTONIX) 40 MG tablet Take 1 tablet (40 mg total) by mouth 2 (two) times daily. 04/20/14   Isaiah Serge, NP  ramipril (ALTACE) 2.5 MG capsule Take 2.5 mg by mouth daily.  02/24/14   Historical Provider, MD  ranitidine (ZANTAC) 150 MG tablet Take 150 mg by mouth 2 (two) times daily.    Historical Provider, MD  ranolazine (RANEXA) 1000 MG SR tablet Take 1 tablet (1,000 mg total) by mouth 2 (two) times daily. 04/20/14   Isaiah Serge, NP  simvastatin (ZOCOR) 20 MG tablet Take 1 tablet (20 mg total) by mouth every evening. 05/04/14   Liliane Shi, PA-C  temazepam (RESTORIL) 15 MG capsule Take 15 mg by mouth at bedtime as needed for sleep.  02/07/14   Historical Provider, MD  tiZANidine (ZANAFLEX) 4 MG tablet Take 4 mg by mouth every 6 (six) hours as needed for muscle spasms.  02/07/14   Historical Provider, MD   BP 120/57 mmHg  Pulse 58  Temp(Src) 98.1 F (36.7 C) (Oral)  Resp 15  Ht 4\' 11"  (1.499 m)  Wt 126 lb (57.153 kg)  BMI 25.44 kg/m2  SpO2 98%   Physical  Exam  Constitutional: She appears well-developed and well-nourished.  HENT:  Head: Normocephalic and atraumatic.  Mouth/Throat: Oropharynx is clear and moist and mucous membranes are normal. Mucous membranes are not dry.  Eyes: Conjunctivae are normal.  Neck: Trachea normal and normal range of motion. Neck supple. Normal carotid pulses and no JVD present. No muscular tenderness present. Carotid bruit is not present. No tracheal deviation present.  Cardiovascular: Normal rate, regular rhythm, S1 normal, S2 normal, normal heart sounds and intact distal pulses.  Exam reveals no decreased pulses.   No murmur heard. Pulmonary/Chest: Effort normal. No respiratory distress. She has no wheezes. She exhibits no tenderness.  Abdominal: Soft. Normal aorta and bowel sounds are normal. There is no tenderness. There is no rebound  and no guarding.  Musculoskeletal: Normal range of motion.  Neurological: She is alert.  Skin: Skin is warm and dry. She is not diaphoretic. No cyanosis. No pallor.  Psychiatric: She has a normal mood and affect.  Nursing note and vitals reviewed.   ED Course  Procedures (including critical care time) Labs Review Labs Reviewed  BASIC METABOLIC PANEL - Abnormal; Notable for the following:    Glucose, Bld 118 (*)    GFR calc non Af Amer 89 (*)    All other components within normal limits  CBC  I-STAT TROPOININ, ED    Imaging Review Dg Chest 2 View  10/22/2014   CLINICAL DATA:  Chest tightness, dizziness  EXAM: CHEST  2 VIEW  COMPARISON:  05/04/2014  FINDINGS: There is no focal parenchymal opacity, pleural effusion, or pneumothorax. The heart and mediastinal contours are unremarkable.  There is evidence of prior anterior cervical fusion.  IMPRESSION: No active cardiopulmonary disease.   Electronically Signed   By: Kathreen Devoid   On: 10/22/2014 14:28     EKG Interpretation   Date/Time:  Sunday October 22 2014 12:57:32 EDT Ventricular Rate:  56 PR Interval:  171 QRS  Duration: 86 QT Interval:  503 QTC Calculation: 485 R Axis:   22 Text Interpretation:  Sinus rhythm Probable left atrial enlargement  Borderline T abnormalities, lateral leads Borderline prolonged QT interval  Abnormal ekg since last tracing no significant change Confirmed by MILLER   MD, BRIAN ([id number]) on 10/22/2014 1:11:05 PM       1:31 PM Patient seen and examined. Work-up initiated. Medications ordered.   Vital signs reviewed and are as follows: BP 120/57 mmHg  Pulse 58  Temp(Src) 98.1 F (36.7 C) (Oral)  Resp 15  Ht 4\' 11"  (1.499 m)  Wt 126 lb (57.153 kg)  BMI 25.44 kg/m2  SpO2 98%  3:21 PM Patient discussed and seen by Dr. Sabra Heck.   I have spoken with Dr. Frances Nickels who will see patient in the ED.   4:07 PM Cardiology to admit for observation.   MDM   Final diagnoses:  Syncope, unspecified syncope type  Chest pain, unspecified chest pain type   Admit.     Carlisle Cater, PA-C 10/22/14 [place], MD 10/22/14 [phone]

## 2014-10-22 NOTE — ED Provider Notes (Signed)
The patient is a 70 year old female, history of coronary disease, takes nitroglycerin more frequently than usual over the last couple of weeks, had chest pain this morning when she awoke, had a syncopal episode in church and now has some residual chest heaviness. Her EKG is unremarkable compared to prior EKGs, her heart and lung sounds are normal without murmurs or any other abnormal findings, anticipate that the patient would need to be admitted to the hospital for her worrisome progressive chest pain and syncopal episode.   EKG Interpretation  Date/Time:  Sunday October 22 2014 12:57:32 EDT Ventricular Rate:  56 PR Interval:  171 QRS Duration: 86 QT Interval:  503 QTC Calculation: 485 R Axis:   22 Text Interpretation:  Sinus rhythm Probable left atrial enlargement Borderline T abnormalities, lateral leads Borderline prolonged QT interval Abnormal ekg since last tracing no significant change Confirmed by Sabra Heck  MD, Quida Glasser ([id number]) on 10/22/2014 1:11:05 PM       Medical screening examination/treatment/procedure(s) were conducted as a shared visit with non-physician practitioner(s) and myself.  I personally evaluated the patient during the encounter.  Clinical Impression:   Final diagnoses:  Syncope, unspecified syncope type  Chest pain, unspecified chest pain type         Noemi Chapel, MD 10/22/14 1652

## 2014-10-22 NOTE — H&P (Signed)
Physician History and Physical     Patient ID: [name] MRN: [id number] DOB/AGE: 69-Oct-1947 69 y.o. Admit date: 10/22/2014  Primary Care Physician: [name], MD Primary Cardiologist:  [facility]  Active Problems:   Atypical chest pain   Syncope   HPI:   69 yo female w/ hx of CAD s/p stenting RCA and PTCA of LAD and component of coronary spasm, HTN, HLD, GERD, anxiety, NSVT, hx of tobacco abuse quit 2 years ago  who presents . She has a history of atypical chest pain of uncertain etiology thought to be GI versus coronary vasospasm.She was admitted in 02/2013 and cath at that time revealed generalized left coronary vasoconstriction relieved with intracoronary nitroglycerin, 40% proximal LAD an eccentric 50% diagonal #1, widely patent circumflex and RCA including the RCA stent and low normal LV systolic function. ECHO 02/2013 revealed low normal EF 50- 55% with moderate hypokinesis of entire inferoposterior myocardium, grade 2 diastolic dysfunction and mild MR.  Had some generalized chest discomfort this am and took one nitro with relief.  Went to church and was singing in choir.  Felt hot all over.  No diaphoresis, palpitations or chest pain.  Then "fell out"  EMS indicated normal BP and HR and BS.  In ER VSS.  No chest pain.  ECG with no acute changes.  Enzymes negative  Telemetry with no arrhythmia  Feels well except some general abdominal "queezyness"    Review of systems complete and found to be negative unless listed above   Past Medical History  Diagnosis Date  . Hypertension   . Hypercholesteremia   . GERD (gastroesophageal reflux disease)   . Anxiety   . Hiatal hernia   . Anemia   . Blood transfusion   . Coronary artery disease   . Coronary vasospasm   . NSVT (nonsustained ventricular tachycardia) 09/26/2013  . Coronary artery spasm, wth continued episodes of chest pain.  09/26/2013  . NSTEMI (non-ST elevated myocardial infarction)     Family History  Problem  Relation Age of Onset  . Stroke Sister   . Heart attack    . Heart attack Neg Hx     History   Social History  . Marital Status: Married    Spouse Name: N/A  . Number of Children: N/A  . Years of Education: N/A   Occupational History  . Not on file.   Social History Main Topics  . Smoking status: Former Smoker -- 1.00 packs/day for 40 years    Types: Cigarettes    Quit date: 10/10/2011  . Smokeless tobacco: Former Systems developer  . Alcohol Use: 0.6 oz/week    1 Cans of beer per week     Comment: occasional  . Drug Use: No  . Sexual Activity: No   Other Topics Concern  . Not on file   Social History Narrative    Past Surgical History  Procedure Laterality Date  . Cardiac stent  2012    to RCA  . Shoulder surgery    . Breast      right, tumor   . Knee surgery  left  . Cardiac catheterization  2014  . Cardiac catheterization  2012  . Left heart catheterization with coronary angiogram N/A 02/22/2013    Procedure: LEFT HEART CATHETERIZATION WITH CORONARY ANGIOGRAM;  Surgeon: [name], MD;  Location: [facility] CATH LAB;  Service: Cardiovascular;  Laterality: N/A;      (Not in a hospital admission)  Physical Exam: Blood pressure 132/51,  pulse 56, temperature 98.1 F (36.7 C), temperature source Oral, resp. rate 20, height 4\' 11"  (1.499 m), weight 57.153 kg (126 lb), SpO2 100 %.    Affect appropriate Healthy:  appears stated age 62: normal Neck supple with no adenopathy JVP normal no bruits no thyromegaly Lungs clear with no wheezing and good diaphragmatic motion Heart:  S1/S2 no murmur, no rub, gallop or click PMI normal Abdomen: benighn, BS positve, no tenderness, no AAA no bruit.  No HSM or HJR Distal pulses intact with no bruits No edema Neuro non-focal Skin warm and dry No muscular weakness  No current facility-administered medications on file prior to encounter.   Current Outpatient Prescriptions on File Prior to Encounter  Medication Sig Dispense  Refill  . aluminum-magnesium hydroxide-simethicone (MAALOX) [phone] MG/5ML SUSP Take 30 mLs by mouth 3 (three) times daily as needed. After meals for indigestion    . amLODipine (NORVASC) 2.5 MG tablet Take 1 tablet (2.5 mg total) by mouth daily. 30 tablet 6  . aspirin EC 81 MG tablet Take 81 mg by mouth daily.    . calcium-vitamin D (OSCAL WITH D) 500-200 MG-UNIT per tablet Take 1 tablet by mouth 2 (two) times daily.    . citalopram (CELEXA) 20 MG tablet Take 20 mg by mouth daily.    . clonazePAM (KLONOPIN) 0.5 MG tablet Take 0.5 mg by mouth 2 (two) times daily as needed. For anxiety    . ferrous sulfate 325 (65 FE) MG tablet Take 325 mg by mouth daily with breakfast.    . isosorbide mononitrate (IMDUR) 120 MG 24 hr tablet Take 120 mg by mouth daily.    Marland Kitchen NITROSTAT 0.4 MG SL tablet DISSOLVE ONE TABLET UNDER TONGUE AS NEEDED FOR CHEST PAIN EVERY 5 MINUTES UP TO 3 DOSES 25 tablet 0  . pantoprazole (PROTONIX) 40 MG tablet Take 1 tablet (40 mg total) by mouth 2 (two) times daily. 60 tablet 2  . ramipril (ALTACE) 2.5 MG capsule Take 2.5 mg by mouth daily.     . ranitidine (ZANTAC) 150 MG tablet Take 150 mg by mouth 2 (two) times daily.    . ranolazine (RANEXA) 1000 MG SR tablet Take 1 tablet (1,000 mg total) by mouth 2 (two) times daily. 60 tablet 6  . simvastatin (ZOCOR) 20 MG tablet Take 1 tablet (20 mg total) by mouth every evening. 30 tablet 11  . temazepam (RESTORIL) 15 MG capsule Take 15 mg by mouth at bedtime as needed for sleep.     Marland Kitchen tiZANidine (ZANAFLEX) 4 MG tablet Take 4 mg by mouth every 6 (six) hours as needed for muscle spasms.       Labs:   Lab Results  Component Value Date   WBC 5.9 10/22/2014   HGB 13.5 10/22/2014   HCT 38.8 10/22/2014   MCV 92.4 10/22/2014   PLT 279 10/22/2014     Recent Labs Lab 10/22/14 1400  NA 139  K 4.1  CL 105  CO2 24  BUN 8  CREATININE 0.66  CALCIUM 9.2  GLUCOSE 118*   Lab Results  Component Value Date   CKTOTAL 96 09/22/2013    CKTOTAL 52 03/10/2013   CKTOTAL 33 03/09/2013   CKMB 4.2* 09/22/2013   CKMB 2.6 03/10/2013   CKMB 1.6 03/09/2013   TROPONINI <0.30 04/19/2014   TROPONINI <0.30 04/19/2014   TROPONINI <0.30 04/18/2014     Lab Results  Component Value Date   CHOL 137 04/19/2014   CHOL 189 09/22/2013  CHOL  08/15/2010    120        ATP III CLASSIFICATION:  <200     mg/dL   Desirable  200-239  mg/dL   Borderline High  >=240    mg/dL   High          Lab Results  Component Value Date   HDL 79 04/19/2014   HDL 90 09/22/2013   HDL 67 08/15/2010   Lab Results  Component Value Date   LDLCALC 37 04/19/2014   LDLCALC 87 09/22/2013   LDLCALC  08/15/2010    41        Total Cholesterol/HDL:CHD Risk Coronary Heart Disease Risk Table                     Men   Women  1/2 Average Risk   3.4   3.3  Average Risk       5.0   4.4  2 X Average Risk   9.6   7.1  3 X Average Risk  23.4   11.0        Use the calculated Patient Ratio above and the CHD Risk Table to determine the patient's CHD Risk.        ATP III CLASSIFICATION (LDL):  <100     mg/dL   Optimal  100-129  mg/dL   Near or Above                    Optimal  130-159  mg/dL   Borderline  160-189  mg/dL   High  >190     mg/dL   Very High   Lab Results  Component Value Date   TRIG 106 04/19/2014   TRIG 62 09/22/2013   TRIG 61 08/15/2010   Lab Results  Component Value Date   CHOLHDL 1.7 04/19/2014   CHOLHDL 2.1 09/22/2013   CHOLHDL 1.8 08/15/2010   No results found for: LDLDIRECT     Radiology: Dg Chest 2 View  10/22/2014   CLINICAL DATA:  Chest tightness, dizziness  EXAM: CHEST  2 VIEW  COMPARISON:  05/04/2014  FINDINGS: There is no focal parenchymal opacity, pleural effusion, or pneumothorax. The heart and mediastinal contours are unremarkable.  There is evidence of prior anterior cervical fusion.  IMPRESSION: No active cardiopulmonary disease.   Electronically Signed   By: Kathreen Devoid   On: 10/22/2014 14:28    EKG:  SR LAE  LVH lateral T wave changes QT 503   ASSESSMENT AND PLAN:   Syncope:  Episode at church sounds vagal.  VSS no chest pain negative enzymes.  Cath 2/15 with patent RCA stent and no obstructive lesions.  QT prolonged on Ranexa will decrease dose.  F/U ECG;s  Will check echo in am.  Don't think stress testing needed continue Rx for presumed vasospasm including nitrates and low dose calcium blocker   D/C tomorrow with outpatient f/u if telemetry ok no further Symptoms and echo ok  Chol:  Continue statin  GERD:  Continue protonix   Signed: Collier Salina Nishan3/13/2016, 3:40 PM

## 2014-10-22 NOTE — ED Notes (Signed)
Patient transported to X-ray 

## 2014-10-22 NOTE — ED Notes (Signed)
At church singing in the choir; felt hot and nauseated. Passed out. Church members assisted to the floor, she did not fall. No injuries. When EMS arrived A&Ox4. EMS attempted to do orthostatic vitals, laying 162/90, sitting 142/60, when they attempted to stand her she lost conciousness again. Aroused after several minutes. A&O on arrival. Reports recent cold and cough. Reports had chest pain this morning, took a nitro SL, and it went away. No recurrence of CP. Denies any pain now.

## 2014-10-22 NOTE — ED Notes (Signed)
Patient returned from X-ray 

## 2014-10-23 ENCOUNTER — Other Ambulatory Visit: Payer: Self-pay

## 2014-10-23 DIAGNOSIS — R197 Diarrhea, unspecified: Secondary | ICD-10-CM | POA: Diagnosis not present

## 2014-10-23 DIAGNOSIS — R079 Chest pain, unspecified: Secondary | ICD-10-CM | POA: Diagnosis not present

## 2014-10-23 DIAGNOSIS — R55 Syncope and collapse: Secondary | ICD-10-CM | POA: Diagnosis not present

## 2014-10-23 DIAGNOSIS — R110 Nausea: Secondary | ICD-10-CM | POA: Diagnosis not present

## 2014-10-23 LAB — TROPONIN I: Troponin I: <0.03 ng/mL (ref <0.031)

## 2014-10-23 MED ORDER — RANOLAZINE ER 500 MG PO TB12: 500.0000 mg | ORAL_TABLET | Freq: Two times a day (BID) | ORAL | Status: DC

## 2014-10-23 MED ORDER — ISOSORBIDE MONONITRATE ER 60 MG PO TB24
60.0000 mg | ORAL_TABLET | Freq: Every day | ORAL | Status: DC
  Administered 2014-10-23: 60 mg via ORAL
  Filled 2014-10-23: qty 1

## 2014-10-23 MED ORDER — RAMIPRIL 2.5 MG PO CAPS: 2.5000 mg | ORAL_CAPSULE | Freq: Every day | ORAL | Status: DC

## 2014-10-23 MED ORDER — AMLODIPINE BESYLATE 2.5 MG PO TABS: 2.5000 mg | ORAL_TABLET | Freq: Every day | ORAL | Status: DC

## 2014-10-23 MED ORDER — RAMIPRIL 2.5 MG PO CAPS
2.5000 mg | ORAL_CAPSULE | Freq: Every day | ORAL | Status: DC
  Administered 2014-10-23: 2.5 mg via ORAL
  Filled 2014-10-23: qty 1

## 2014-10-23 MED ORDER — ISOSORBIDE MONONITRATE ER 60 MG PO TB24: 60.0000 mg | ORAL_TABLET | Freq: Every day | ORAL | Status: DC

## 2014-10-23 MED ORDER — NITROGLYCERIN 0.4 MG SL SUBL: 0.4000 mg | SUBLINGUAL_TABLET | SUBLINGUAL | Status: DC | PRN

## 2014-10-23 NOTE — Progress Notes (Addendum)
Patient Name: [name] Date of Encounter: 10/23/2014  Principal Problem:   Syncope Active Problems:   Atypical chest pain   Primary Cardiologist: Edwina Barth. Soliana Kitko Patient Profile: 69 y.o. African-American female with a history of CAD s/p stenting RCA and PTCA of LAD and component of coronary spasm, HTN, HLD, GERD, anxiety, NSVT, hx of tobacco (quit 2 years), admitted 03/13 after a syncopal episode at church. She had chest tightness without radiation earlier that morning that was relieved by one nitro.   SUBJECTIVE: Denies any CP, SOB, or dizziness overnight. She did state that she normally sleeps on 2 pillows at night and over about the last month she has had to add an additional pillow due to feeling SOB.   OBJECTIVE Filed Vitals:   10/23/14 0000 10/23/14 0350 10/23/14 0404 10/23/14 0733  BP: 88/37 102/52  97/56  Pulse: 52 66  57  Temp: 97.9 F (36.6 C) 97.4 F (36.3 C)  97.9 F (36.6 C)  TempSrc: Oral Oral  Oral  Resp: 18 16  15   Height:      Weight:   120 lb 12.8 oz (54.795 kg)   SpO2: 97% 100%  100%    Intake/Output Summary (Last 24 hours) at 10/23/14 0930 Last data filed at 10/23/14 0824  Gross per 24 hour  Intake    480 ml  Output    825 ml  Net   -345 ml   Filed Weights   10/22/14 1303 10/22/14 1725 10/23/14 0404  Weight: 126 lb (57.153 kg) 119 lb 14.9 oz (54.4 kg) 120 lb 12.8 oz (54.795 kg)    PHYSICAL EXAM General: Well developed, well nourished, female in no acute distress. Head: Normocephalic, atraumatic.  Neck: Supple without bruits, no JVD. Lungs:  Resp regular and unlabored, CTA. Heart: Brady, RRR, S1, S2, no S3, S4, or murmur; no rub. Abdomen: Soft, non-tender, non-distended, BS + x 4.  Extremities: No clubbing, cyanosis, or edema.  Neuro: Alert and oriented X 3. Moves all extremities spontaneously. Psych: Normal affect.  LABS: CBC:  Recent Labs  10/22/14 1400  WBC 5.9  HGB 13.5  HCT 38.8  MCV 92.4  PLT 676   Basic Metabolic  Panel:  Recent Labs  10/22/14 1400  NA 139  K 4.1  CL 105  CO2 24  GLUCOSE 118*  BUN 8  CREATININE 0.66  CALCIUM 9.2   Cardiac Enzymes:  Recent Labs  10/23/14 0437  TROPONINI <0.03    Recent Labs  10/22/14 1408  TROPIPOC 0.00   TELE:  Sinus Loletha Grayer, occ high 40s overnight, asymptomatic    ECG: Sinus Loletha Grayer, 58   Radiology/Studies: Dg Chest 2 View 10/22/2014   CLINICAL DATA:  Chest tightness, dizziness  EXAM: CHEST  2 VIEW  COMPARISON:  05/04/2014  FINDINGS: There is no focal parenchymal opacity, pleural effusion, or pneumothorax. The heart and mediastinal contours are unremarkable.  There is evidence of prior anterior cervical fusion.  IMPRESSION: No active cardiopulmonary disease.   Electronically Signed   By: Kathreen Devoid   On: 10/22/2014 14:28     Current Medications:  . amLODipine  2.5 mg Oral Daily  . aspirin EC  81 mg Oral Daily  . ferrous sulfate  325 mg Oral Q breakfast  . isosorbide mononitrate  120 mg Oral Daily  . pantoprazole  40 mg Oral BID  . ramipril  2.5 mg Oral Daily  . ranolazine  500 mg Oral BID  . simvastatin  20 mg Oral  QPM      ASSESSMENT AND PLAN: Active Problems:  Atypical chest pain  -No CP or SOB overnight  -Negative Troponin levels  -Echo pending  -Due to positive orthopnea will recheck BNP, last drawn 09/21/2013: 99.1  -hypotensive and bradycardic overnight, asymptomatic (said slept poorly)  -Continue home doses of, aspirin, and simvastatin   -Hold amlodipine, ramipril    -Continue Ranolazine at decreased dose  -Decrease Isosorbide mononitrate to 60 mg daily   - ambulate and see how tolerated   Syncope -No dizziness or syncope overnight -Negative orthostatic vital signs  -Will decrease dose of nitrates and BP meds as noted above   Signed, Rhonda Barrett , PA-C 9:30 AM 10/23/2014  Patient was seen and examined. There is no chest pain, markers are negative, and ECG with long QT. Plan ambulate, cancel echo,  and adjust meds  down as noted. If all unremarkable course, can discharge later today if BP stable.Marland Kitchen

## 2014-10-23 NOTE — Progress Notes (Signed)
Patient has met adequate criteria for discharge per MD order. Required education, follow-up appointments, printed patient discharge summary and where to pick up medicines was discussed and given to patient and spouse. All invasive lines and hospital equipment were removed from patient. Patient left the hospital with belongings in hand with spouse, escorted by hospital transportation via wheelchair.  

## 2014-10-23 NOTE — Discharge Summary (Signed)
CARDIOLOGY DISCHARGE SUMMARY   Patient ID: [name] MRN: [id number] DOB/AGE: 09/13/1945 69 y.o.  Admit date: 10/22/2014 Discharge date: 10/23/2014  PCP: [name], MD Primary Cardiologist: Dr. Tamala Julian  Primary Discharge Diagnosis:  Syncope  Secondary Discharge Diagnosis:    Atypical chest pain  Procedures: CXR  Hospital Course: [name] is a 69 y.o. female with a history of CAD and coronary spasm.   She was singing in church and felt hot all over, then lost consciousness. By EMS arrival, her HR and BP were normal. No chest pain, but had some abdominal discomfort. She was transported to the hospital and admitted for further evaluation.  She was held overnight. There were no significant arrhythmias on telemetry, she had sinus bradycardia, but was asymptomatic with a heart rate in the 50s. Cardiac enzymes were negative for MI. She had no further episodes of presyncope or syncope. Orthostatic VS were negative, although she stated she had not eaten or had much to drink that morning.  Her BP was low, in the 80s at times. She had been on amlodipine 2.5 mg, ramipril 2.5 mg, Imdur 120 mg and Ranexa 1000 mg bid. The amlodipine and ramipril were held, the Imdur dose was decreased to 60 mg daily and the Ranexa was decreased to 500 mg bid. Her BP is higher now and she is asymptomatic. No further inpatient workup is indicated and she is considered stable for discharge, to follow up as an outpatient.  Labs:   Lab Results  Component Value Date   WBC 5.9 10/22/2014   HGB 13.5 10/22/2014   HCT 38.8 10/22/2014   MCV 92.4 10/22/2014   PLT 279 10/22/2014     Recent Labs Lab 10/22/14 1400  NA 139  K 4.1  CL 105  CO2 24  BUN 8  CREATININE 0.66  CALCIUM 9.2  GLUCOSE 118*    Recent Labs  10/23/14 0437  TROPONINI <0.03      Radiology: Dg Chest 2 View 10/22/2014   CLINICAL DATA:  Chest tightness, dizziness  EXAM: CHEST  2 VIEW  COMPARISON:  05/04/2014  FINDINGS:  There is no focal parenchymal opacity, pleural effusion, or pneumothorax. The heart and mediastinal contours are unremarkable.  There is evidence of prior anterior cervical fusion.  IMPRESSION: No active cardiopulmonary disease.   Electronically Signed   By: Kathreen Devoid   On: 10/22/2014 14:28   EKG: Sinus bradycardia, HR 50  FOLLOW UP PLANS AND APPOINTMENTS No Known Allergies   Medication List    STOP taking these medications        amLODipine 2.5 MG tablet  Commonly known as:  NORVASC     ramipril 2.5 MG capsule  Commonly known as:  ALTACE      TAKE these medications        aluminum-magnesium hydroxide-simethicone 888-280-03 MG/5ML Susp  Commonly known as:  MAALOX  Take 30 mLs by mouth 3 (three) times daily as needed. After meals for indigestion     aspirin EC 81 MG tablet  Take 81 mg by mouth daily.     calcium-vitamin D 500-200 MG-UNIT per tablet  Commonly known as:  OSCAL WITH D  Take 1 tablet by mouth 2 (two) times daily.     citalopram 20 MG tablet  Commonly known as:  CELEXA  Take 20 mg by mouth daily.     clonazePAM 0.5 MG tablet  Commonly known as:  KLONOPIN  Take 0.5 mg by mouth 2 (two) times daily as  needed. For anxiety     ferrous sulfate 325 (65 FE) MG tablet  Take 325 mg by mouth daily with breakfast.     isosorbide mononitrate 60 MG 24 hr tablet  Commonly known as:  IMDUR  Take 1 tablet (60 mg total) by mouth daily.     nitroGLYCERIN 0.4 MG SL tablet  Commonly known as:  NITROSTAT  Place 1 tablet (0.4 mg total) under the tongue every 5 (five) minutes as needed for chest pain.     pantoprazole 40 MG tablet  Commonly known as:  PROTONIX  Take 1 tablet (40 mg total) by mouth 2 (two) times daily.     ranitidine 150 MG tablet  Commonly known as:  ZANTAC  Take 150 mg by mouth 2 (two) times daily.     ranolazine 500 MG 12 hr tablet  Commonly known as:  RANEXA  Take 1 tablet (500 mg total) by mouth 2 (two) times daily.     simvastatin 20 MG tablet   Commonly known as:  ZOCOR  Take 1 tablet (20 mg total) by mouth every evening.     temazepam 15 MG capsule  Commonly known as:  RESTORIL  Take 15 mg by mouth at bedtime as needed for sleep.     tiZANidine 4 MG tablet  Commonly known as:  ZANAFLEX  Take 4 mg by mouth every 6 (six) hours as needed for muscle spasms.        Discharge Instructions    Diet - low sodium heart healthy    Complete by:  As directed      Increase activity slowly    Complete by:  As directed           Follow-up Information    Follow up with [name], MD On 11/24/2014.   Specialty:  Cardiology   Why:  See MD at 11:45 am, please arrive 15 minutes early for paperwork.   Contact information:   [address] [address] [phone]       BRING ALL MEDICATIONS WITH YOU TO FOLLOW UP APPOINTMENTS  Time spent with patient to include physician time: 38 min Signed: Rosaria Ferries, PA-C 10/23/2014, 12:33 PM Co-Sign MD

## 2014-10-23 NOTE — Progress Notes (Signed)
UR completed 

## 2014-11-24 ENCOUNTER — Ambulatory Visit (INDEPENDENT_AMBULATORY_CARE_PROVIDER_SITE_OTHER): Payer: Medicare Other | Admitting: Interventional Cardiology

## 2014-11-24 ENCOUNTER — Encounter: Payer: Self-pay | Admitting: Interventional Cardiology

## 2014-11-24 VITALS — BP 130/60 | HR 67 | Ht 59.0 in | Wt 120.1 lb

## 2014-11-24 DIAGNOSIS — E785 Hyperlipidemia, unspecified: Secondary | ICD-10-CM | POA: Diagnosis not present

## 2014-11-24 DIAGNOSIS — I10 Essential (primary) hypertension: Secondary | ICD-10-CM | POA: Diagnosis not present

## 2014-11-24 DIAGNOSIS — I201 Angina pectoris with documented spasm: Secondary | ICD-10-CM | POA: Diagnosis not present

## 2014-11-24 DIAGNOSIS — I2510 Atherosclerotic heart disease of native coronary artery without angina pectoris: Secondary | ICD-10-CM

## 2014-11-24 DIAGNOSIS — R55 Syncope and collapse: Secondary | ICD-10-CM | POA: Diagnosis not present

## 2014-11-24 NOTE — Progress Notes (Signed)
Cardiology Office Note   Date:  11/24/2014   ID:  [name], DOB 1946-06-11, MRN [id number]  PCP:  [name], MD  Cardiologist:   [name], MD   Chief Complaint  Patient presents with  . Chest Pain      History of Present Illness: [name] is a 69 y.o. female who presents for coronary artery disease with fixed disease and RCA stent, and vasoactive angina (spasm), hyperlipidemia, tobacco abuse, COPD, and hypertension.  There is a relatively recent hospitalization for prolonged chest pain. She ruled out for myocardial infarction. Medication adjustments were made which included discontinuation of amlodipine and ramipril. As a consequence of this adjustment, lightheadedness and dizziness has been less frequent. Unfortunately, she has resumed cigarette use. She has not used nitroglycerin since discharge from the hospital. She has had no recurrence of syncope.   Past Medical History  Diagnosis Date  . Hypertension   . Hypercholesteremia   . GERD (gastroesophageal reflux disease)   . Anxiety   . Hiatal hernia   . Anemia   . Blood transfusion   . Coronary artery disease   . Coronary vasospasm   . NSVT (nonsustained ventricular tachycardia) 09/26/2013  . Coronary artery spasm, wth continued episodes of chest pain.  09/26/2013  . NSTEMI (non-ST elevated myocardial infarction)     Past Surgical History  Procedure Laterality Date  . Cardiac stent  2012    to RCA  . Shoulder surgery    . Breast      right, tumor   . Knee surgery  left  . Cardiac catheterization  2014  . Cardiac catheterization  2012  . Left heart catheterization with coronary angiogram N/A 02/22/2013    Procedure: LEFT HEART CATHETERIZATION WITH CORONARY ANGIOGRAM;  Surgeon: [name], MD;  Location: [facility] CATH LAB;  Service: Cardiovascular;  Laterality: N/A;     Current Outpatient Prescriptions  Medication Sig Dispense Refill  . aluminum-magnesium hydroxide-simethicone (MAALOX)  [phone] MG/5ML SUSP Take 30 mLs by mouth 3 (three) times daily as needed. After meals for indigestion    . aspirin EC 81 MG tablet Take 81 mg by mouth daily.    . calcium-vitamin D (OSCAL WITH D) 500-200 MG-UNIT per tablet Take 1 tablet by mouth 2 (two) times daily.    . citalopram (CELEXA) 20 MG tablet Take 20 mg by mouth daily.    . clonazePAM (KLONOPIN) 0.5 MG tablet Take 0.5 mg by mouth 2 (two) times daily as needed. For anxiety    . ferrous sulfate 325 (65 FE) MG tablet Take 325 mg by mouth daily with breakfast.    . isosorbide mononitrate (IMDUR) 60 MG 24 hr tablet Take 1 tablet (60 mg total) by mouth daily. 30 tablet 11  . nitroGLYCERIN (NITROSTAT) 0.4 MG SL tablet Place 1 tablet (0.4 mg total) under the tongue every 5 (five) minutes as needed for chest pain. 25 tablet 3  . pantoprazole (PROTONIX) 40 MG tablet Take 1 tablet (40 mg total) by mouth 2 (two) times daily. 60 tablet 2  . ranitidine (ZANTAC) 150 MG tablet Take 150 mg by mouth 2 (two) times daily.    . ranolazine (RANEXA) 500 MG 12 hr tablet Take 1 tablet (500 mg total) by mouth 2 (two) times daily. 60 tablet 11  . simvastatin (ZOCOR) 20 MG tablet Take 1 tablet (20 mg total) by mouth every evening. 30 tablet 11  . temazepam (RESTORIL) 15 MG capsule Take 15 mg by mouth at  bedtime as needed for sleep.     Marland Kitchen tiZANidine (ZANAFLEX) 4 MG tablet Take 4 mg by mouth every 6 (six) hours as needed for muscle spasms.      No current facility-administered medications for this visit.    Allergies:   Review of patient's allergies indicates no known allergies.    Social History:  The patient  reports that she quit smoking about 3 years ago. Her smoking use included Cigarettes. She has a 40 pack-year smoking history. She has quit using smokeless tobacco. She reports that she drinks about 0.6 oz of alcohol per week. She reports that she does not use illicit drugs.   Family History:  The patient's family history includes Heart attack in an  other family member; Heart failure in her mother; Stroke in her sister. There is no history of Heart attack.    ROS:  Please see the history of present illness.   Otherwise, review of systems are positive for resumed cigarette smoking. Now has a cough that she feels is partially related to pollen. Low back discomfort, muscle aches, abdominal pain, sweating, and easy bruising..   All other systems are reviewed and negative.    PHYSICAL EXAM: VS:  BP 130/60 mmHg  Pulse 67  Ht 4\' 11"  (1.499 m)  Wt 120 lb 1.9 oz (54.486 kg)  BMI 24.25 kg/m2 , BMI Body mass index is 24.25 kg/(m^2). GEN: Well nourished, well developed, in no acute distressColon tobacco smell. HEENT: normal Neck: no JVD, carotid bruits, or masses Cardiac: RRR; no murmurs, rubs, or gallops,no edema  Respiratory:  clear to auscultation bilaterally, normal work of breathing GI: soft, nontender, nondistended, + BS MS: no deformity or atrophy Skin: warm and dry, no rash Neuro:  Strength and sensation are intact Psych: euthymic mood, full affect   EKG:  EKG is not ordered today.    Recent Labs: 04/19/2014: ALT 9 10/22/2014: BUN 8; Creatinine 0.66; Hemoglobin 13.5; Platelets 279; Potassium 4.1; Sodium 139    Lipid Panel    Component Value Date/Time   CHOL 137 04/19/2014 0035   TRIG 106 04/19/2014 0035   HDL 79 04/19/2014 0035   CHOLHDL 1.7 04/19/2014 0035   VLDL 21 04/19/2014 0035   LDLCALC 37 04/19/2014 0035      Wt Readings from Last 3 Encounters:  11/24/14 120 lb 1.9 oz (54.486 kg)  10/23/14 120 lb 12.8 oz (54.795 kg)  05/04/14 124 lb (56.246 kg)      Other studies Reviewed: Additional studies/ records that were reviewed today include: Reviewed prior cath images Review of the above records demonstrates: documented location of prior stent. RCA location.    ASSESSMENT AND PLAN:  Coronary artery with known fixed obstructive disease and vasoconstriction: Currently stable with atypical episodes of chest pain  not requiring nitroglycerin.  Essential hypertensionColon despite discontinuation of REM a pro-and amlodipine, hypertension is not a significant issue.  Syncope, unspecified syncope typeColon episodes of lightheadedness/near syncope have decreased dramatically off amlodipine and Altace.  Dyslipidemia : followed by primary care physician        Current medicines are reviewed at length with the patient today.  The patient does not have concerns regarding medicines.  The following changes have been made:  Confirmed that both Altace and amlodipine will remain off her medication list.  Labs/ tests ordered today include:  No orders of the defined types were placed in this encounter.     Disposition:   FU with Linard Millers in 6 months  Signed, [name], MD  11/24/2014 12:33 PM    [address], [place], [place]  [address] Phone: [phone]; Fax: [phone]

## 2014-11-24 NOTE — Patient Instructions (Signed)
Medication Instructions:  Your physician recommends that you continue on your current medications as directed. Please refer to the Current Medication list given to you today.  Labwork: No new orders.   Testing/Procedures: No new orders.  Follow-Up: Your physician wants you to follow-up in: 6 MONTHS with Dr Tamala Julian.  You will receive a reminder letter in the mail two months in advance. If you don't receive a letter, please call our office to schedule the follow-up appointment.  Any Other Special Instructions Will Be Listed Below (If Applicable).  STOP SMOKING!!!!  Smoking Cessation Quitting smoking is important to your health and has many advantages. However, it is not always easy to quit since nicotine is a very addictive drug. Oftentimes, people try 3 times or more before being able to quit. This document explains the best ways for you to prepare to quit smoking. Quitting takes hard work and a lot of effort, but you can do it. ADVANTAGES OF QUITTING SMOKING  You will live longer, feel better, and live better.  Your body will feel the impact of quitting smoking almost immediately.  Within 20 minutes, blood pressure decreases. Your pulse returns to its normal level.  After 8 hours, carbon monoxide levels in the blood return to normal. Your oxygen level increases.  After 24 hours, the chance of having a heart attack starts to decrease. Your breath, hair, and body stop smelling like smoke.  After 48 hours, damaged nerve endings begin to recover. Your sense of taste and smell improve.  After 72 hours, the body is virtually free of nicotine. Your bronchial tubes relax and breathing becomes easier.  After 2 to 12 weeks, lungs can hold more air. Exercise becomes easier and circulation improves.  The risk of having a heart attack, stroke, cancer, or lung disease is greatly reduced.  After 1 year, the risk of coronary heart disease is cut in half.  After 5 years, the risk of stroke falls  to the same as a nonsmoker.  After 10 years, the risk of lung cancer is cut in half and the risk of other cancers decreases significantly.  After 15 years, the risk of coronary heart disease drops, usually to the level of a nonsmoker.  If you are pregnant, quitting smoking will improve your chances of having a healthy baby.  The people you live with, especially any children, will be healthier.  You will have extra money to spend on things other than cigarettes. QUESTIONS TO THINK ABOUT BEFORE ATTEMPTING TO QUIT You may want to talk about your answers with your health care provider.  Why do you want to quit?  If you tried to quit in the past, what helped and what did not?  What will be the most difficult situations for you after you quit? How will you plan to handle them?  Who can help you through the tough times? Your family? Friends? A health care provider?  What pleasures do you get from smoking? What ways can you still get pleasure if you quit? Here are some questions to ask your health care provider:  How can you help me to be successful at quitting?  What medicine do you think would be best for me and how should I take it?  What should I do if I need more help?  What is smoking withdrawal like? How can I get information on withdrawal? GET READY  Set a quit date.  Change your environment by getting rid of all cigarettes, ashtrays, matches, and lighters  in your home, car, or work. Do not let people smoke in your home.  Review your past attempts to quit. Think about what worked and what did not. GET SUPPORT AND ENCOURAGEMENT You have a better chance of being successful if you have help. You can get support in many ways.  Tell your family, friends, and coworkers that you are going to quit and need their support. Ask them not to smoke around you.  Get individual, group, or telephone counseling and support. Programs are available at [employer] and health centers. Call  your local health department for information about programs in your area.  Spiritual beliefs and practices may help some smokers quit.  Download a "quit meter" on your computer to keep track of quit statistics, such as how long you have gone without smoking, cigarettes not smoked, and money saved.  Get a self-help book about quitting smoking and staying off tobacco. [place] yourself from urges to smoke. Talk to someone, go for a walk, or occupy your time with a task.  Change your normal routine. Take a different route to work. Drink tea instead of coffee. Eat breakfast in a different place.  Reduce your stress. Take a hot bath, exercise, or read a book.  Plan something enjoyable to do every day. Reward yourself for not smoking.  Explore interactive web-based programs that specialize in helping you quit. GET MEDICINE AND USE IT CORRECTLY Medicines can help you stop smoking and decrease the urge to smoke. Combining medicine with the above behavioral methods and support can greatly increase your chances of successfully quitting smoking.  Nicotine replacement therapy helps deliver nicotine to your body without the negative effects and risks of smoking. Nicotine replacement therapy includes nicotine gum, lozenges, inhalers, nasal sprays, and skin patches. Some may be available over-the-counter and others require a prescription.  Antidepressant medicine helps people abstain from smoking, but how this works is unknown. This medicine is available by prescription.  Nicotinic receptor partial agonist medicine simulates the effect of nicotine in your brain. This medicine is available by prescription. Ask your health care provider for advice about which medicines to use and how to use them based on your health history. Your health care provider will tell you what side effects to look out for if you choose to be on a medicine or therapy. Carefully read the information on  the package. Do not use any other product containing nicotine while using a nicotine replacement product.  RELAPSE OR DIFFICULT SITUATIONS Most relapses occur within the first 3 months after quitting. Do not be discouraged if you start smoking again. Remember, most people try several times before finally quitting. You may have symptoms of withdrawal because your body is used to nicotine. You may crave cigarettes, be irritable, feel very hungry, cough often, get headaches, or have difficulty concentrating. The withdrawal symptoms are only temporary. They are strongest when you first quit, but they will go away within 10-14 days. To reduce the chances of relapse, try to:  Avoid drinking alcohol. Drinking lowers your chances of successfully quitting.  Reduce the amount of caffeine you consume. Once you quit smoking, the amount of caffeine in your body increases and can give you symptoms, such as a rapid heartbeat, sweating, and anxiety.  Avoid smokers because they can make you want to smoke.  Do not let weight gain distract you. Many smokers will gain weight when they quit, usually less than 10 pounds. Eat a healthy diet  and stay active. You can always lose the weight gained after you quit.  Find ways to improve your mood other than smoking. FOR MORE INFORMATION  www.smokefree.gov  Document Released: 07/22/2001 Document Revised: 12/12/2013 Document Reviewed: 11/06/2011 [facility] Patient Information 2015 [place], [place]. This information is not intended to replace advice given to you by your health care provider. Make sure you discuss any questions you have with your health care provider.

## 2015-01-25 ENCOUNTER — Other Ambulatory Visit: Payer: Self-pay | Admitting: Internal Medicine

## 2015-01-25 DIAGNOSIS — R911 Solitary pulmonary nodule: Secondary | ICD-10-CM

## 2015-02-14 ENCOUNTER — Ambulatory Visit
Admission: RE | Admit: 2015-02-14 | Discharge: 2015-02-14 | Disposition: A | Discharge status: Home / Self Care | Payer: Medicare Other | Source: Ambulatory Visit | Attending: Internal Medicine | Admitting: Internal Medicine

## 2015-02-14 DIAGNOSIS — R911 Solitary pulmonary nodule: Secondary | ICD-10-CM

## 2015-03-31 ENCOUNTER — Other Ambulatory Visit: Payer: Self-pay | Admitting: Interventional Cardiology

## 2015-06-26 ENCOUNTER — Encounter: Payer: Self-pay | Admitting: *Deleted

## 2015-06-29 ENCOUNTER — Ambulatory Visit (INDEPENDENT_AMBULATORY_CARE_PROVIDER_SITE_OTHER): Payer: Medicare Other | Admitting: Interventional Cardiology

## 2015-06-29 ENCOUNTER — Ambulatory Visit
Admission: RE | Admit: 2015-06-29 | Discharge: 2015-06-29 | Disposition: A | Discharge status: Home / Self Care | Payer: Medicare Other | Source: Ambulatory Visit | Attending: Interventional Cardiology | Admitting: Interventional Cardiology

## 2015-06-29 ENCOUNTER — Encounter: Payer: Self-pay | Admitting: Interventional Cardiology

## 2015-06-29 VITALS — BP 104/62 | HR 68 | Ht 59.0 in | Wt 119.2 lb

## 2015-06-29 DIAGNOSIS — J410 Simple chronic bronchitis: Secondary | ICD-10-CM

## 2015-06-29 DIAGNOSIS — I201 Angina pectoris with documented spasm: Secondary | ICD-10-CM

## 2015-06-29 DIAGNOSIS — R634 Abnormal weight loss: Secondary | ICD-10-CM

## 2015-06-29 DIAGNOSIS — I10 Essential (primary) hypertension: Secondary | ICD-10-CM

## 2015-06-29 DIAGNOSIS — I2510 Atherosclerotic heart disease of native coronary artery without angina pectoris: Secondary | ICD-10-CM

## 2015-06-29 DIAGNOSIS — K219 Gastro-esophageal reflux disease without esophagitis: Secondary | ICD-10-CM

## 2015-06-29 DIAGNOSIS — E785 Hyperlipidemia, unspecified: Secondary | ICD-10-CM | POA: Diagnosis not present

## 2015-06-29 NOTE — Patient Instructions (Addendum)
Medication Instructions:   Your physician recommends that you continue on your current medications as directed. Please refer to the Current Medication list given to you today.   If you need a refill on your cardiac medications before your next appointment, please call your pharmacy.  Labwork: TSH    Testing/Procedures: [place] FIST LEVEL  Groom IMAGING SIGN  ADVERTISED OUTSIDE DOOR ENTRANCE  A chest x-ray takes a picture of the organs and structures inside the chest, including the heart, lungs, and blood vessels. This test can show several things, including, whether the heart is enlarges; whether fluid is building up in the lungs; and whether pacemaker / defibrillator leads are still in place.   Follow-Up:  Your physician wants you to follow-up in: [place] will receive a reminder letter in the mail two months in advance. If you don't receive a letter, please call our office to schedule the follow-up appointment.     Any Other Special Instructions Will Be Listed Below (If Applicable).

## 2015-06-29 NOTE — Progress Notes (Signed)
Cardiology Office Note   Date:  06/29/2015   ID:  [name], DOB 1946/06/25, MRN [id number]  PCP:  [name], MD  Cardiologist:  [name], MD   Chief Complaint  Patient presents with  . Coronary Artery Disease  . Congestive Heart Failure      History of Present Illness: [name] is a 70 y.o. female who presents for obstructive coronary disease with prior stenting, well documented recurrent coronary spasm, recurrent syncope with neurally mediated features, hypertension, hyperlipidemia, tobacco abuse, and gastroesophageal reflux.  [name] continues to smoke but not heavily. Less than one pack per day.  Anginal episodes are rare. Nitroglycerin relieves the discomfort. She denies exertional discomfort. She is active.  She continues to have recurring episodes of diaphoresis, weakness, and near syncope if she doesn't lie down. It may take up to an hour to these episodes to resolve. She will occasionally take sublingual nitroglycerin if there is associated chest discomfort.  Past Medical History  Diagnosis Date  . Hypertension   . Hypercholesteremia   . GERD (gastroesophageal reflux disease)   . Anxiety   . Hiatal hernia   . Anemia   . Blood transfusion   . Coronary artery disease   . Coronary vasospasm ([place])   . NSVT (nonsustained ventricular tachycardia) ([place]) 09/26/2013  . Coronary artery spasm, wth continued episodes of chest pain.  09/26/2013  . NSTEMI (non-ST elevated myocardial infarction) [facility])     Past Surgical History  Procedure Laterality Date  . Cardiac stent  2012    to RCA  . Shoulder surgery    . Breast      right, tumor   . Knee surgery  left  . Cardiac catheterization  2014  . Cardiac catheterization  2012  . Left heart catheterization with coronary angiogram N/A 02/22/2013    Procedure: LEFT HEART CATHETERIZATION WITH CORONARY ANGIOGRAM;  Surgeon: [name], MD;  Location: [facility] CATH LAB;  Service: Cardiovascular;   Laterality: N/A;     Current Outpatient Prescriptions  Medication Sig Dispense Refill  . aspirin EC 81 MG tablet Take 81 mg by mouth daily.    . calcium-vitamin D (OSCAL WITH D) 500-200 MG-UNIT per tablet Take 1 tablet by mouth 2 (two) times daily.    . citalopram (CELEXA) 20 MG tablet Take 20 mg by mouth daily.    . clonazePAM (KLONOPIN) 0.5 MG tablet Take 0.5 mg by mouth 2 (two) times daily as needed. For anxiety    . ferrous sulfate 325 (65 FE) MG tablet Take 325 mg by mouth daily with breakfast.    . isosorbide mononitrate (IMDUR) 60 MG 24 hr tablet Take 1 tablet (60 mg total) by mouth daily. 30 tablet 11  . nitroGLYCERIN (NITROSTAT) 0.4 MG SL tablet Place 1 tablet (0.4 mg total) under the tongue every 5 (five) minutes as needed for chest pain. 25 tablet 3  . NITROSTAT 0.4 MG SL tablet DISSOLVE 1 TABLET UNDER TONGUE AS NEEDED FOR CHEST PAIN EVERY 5 MINUTES UP TO 3 DOSES 25 tablet 3  . pantoprazole (PROTONIX) 40 MG tablet Take 1 tablet (40 mg total) by mouth 2 (two) times daily. 60 tablet 2  . ranitidine (ZANTAC) 150 MG tablet Take 150 mg by mouth 2 (two) times daily.    . ranolazine (RANEXA) 500 MG 12 hr tablet Take 1 tablet (500 mg total) by mouth 2 (two) times daily. 60 tablet 11  . simvastatin (ZOCOR) 20 MG tablet Take 1 tablet (20  mg total) by mouth every evening. 30 tablet 11  . temazepam (RESTORIL) 15 MG capsule Take 15 mg by mouth at bedtime as needed for sleep.     Marland Kitchen tiZANidine (ZANAFLEX) 4 MG tablet Take 4 mg by mouth every 6 (six) hours as needed for muscle spasms.      No current facility-administered medications for this visit.    Allergies:   Review of patient's allergies indicates no known allergies.    Social History:  The patient  reports that she quit smoking about 3 years ago. Her smoking use included Cigarettes. She has a 40 pack-year smoking history. She has quit using smokeless tobacco. She reports that she drinks about 0.6 oz of alcohol per week. She reports that  she does not use illicit drugs.   Family History:  The patient's family history includes Heart attack in an other family member; Heart failure in her mother; Stroke in her sister. There is no history of Heart attack.    ROS:  Please see the history of present illness.   Otherwise, review of systems are positive for decreased appetite, 6 pound weight loss, otherwise unremarkable. She specifically denies dysphagia, melena, cough, hemoptysis, heat intolerance, and racing heart..   All other systems are reviewed and negative.    PHYSICAL EXAM: VS:  BP 104/62 mmHg  Pulse 68  Ht 4\' 11"  (1.499 m)  Wt 54.069 kg (119 lb 3.2 oz)  BMI 24.06 kg/m2  SpO2 99% , BMI Body mass index is 24.06 kg/(m^2). GEN: Well nourished, well developed, in no acute distress HEENT: normal Neck: no JVD, carotid bruits, or masses Cardiac: RRR.  There is no murmur, rub, or gallop. There is no edema. Respiratory:  clear to auscultation bilaterally, normal work of breathing. GI: soft, nontender, nondistended, + BS MS: no deformity or atrophy Skin: warm and dry, no rash Neuro:  Strength and sensation are intact Psych: euthymic mood, full affect   EKG:  EKG is not ordered today.    Recent Labs: 10/22/2014: BUN 8; Creatinine, Ser 0.66; Hemoglobin 13.5; Platelets 279; Potassium 4.1; Sodium 139    Lipid Panel    Component Value Date/Time   CHOL 137 04/19/2014 0035   TRIG 106 04/19/2014 0035   HDL 79 04/19/2014 0035   CHOLHDL 1.7 04/19/2014 0035   VLDL 21 04/19/2014 0035   LDLCALC 37 04/19/2014 0035      Wt Readings from Last 3 Encounters:  06/29/15 54.069 kg (119 lb 3.2 oz)  11/24/14 54.486 kg (120 lb 1.9 oz)  10/23/14 54.795 kg (120 lb 12.8 oz)      Other studies Reviewed: Additional studies/ records that were reviewed today include: Electronic medical record is reviewed.. The findings include June chest x-ray revealed multiple nodules..    ASSESSMENT AND PLAN:  1. CAD in native artery Stable.  Rare episodes of recurrent angina likely due to spasm.  2. Essential hypertension Well controlled  3. Dyslipidemia On therapy followed by primary care  4. Coronary artery spasm, wth continued episodes of chest pain.   as above  5. Gastroesophageal reflux disease without esophagitis Stable stable  6. Smokers' cough ([place]) Continues to smoke  7. Weight decrease 6 pound weight loss, unexplained. In review of prior chest x-rays is evidence of nodules 6 months ago. I am concerned about weight loss and her because she is at risk for lung cancer, esophageal/gastric  8. Nodules on chest x-ray, June 2016   Current medicines are reviewed at length with the patient today.  The patient has the following concerns regarding medicines: none.  The following changes/actions have been instituted:    TSH to rule out hyperthyroidism  PA and lateral chest x-ray and consider chest CT  I advised her to discuss weight loss with her primary physician  I advised her to eat a more balanced diet. She is sometimes eating only one meal per day.  Labs/ tests ordered today include:  No orders of the defined types were placed in this encounter.     Disposition:   FU with HS in 1 year  Signed, [name], MD  06/29/2015 9:23 AM    [address], [place], [place]  [address] Phone: [phone]; Fax: [phone]

## 2015-11-21 ENCOUNTER — Other Ambulatory Visit: Payer: Self-pay | Admitting: Physician Assistant

## 2015-11-28 ENCOUNTER — Other Ambulatory Visit: Payer: Self-pay

## 2015-11-28 DIAGNOSIS — Z1231 Encounter for screening mammogram for malignant neoplasm of breast: Secondary | ICD-10-CM

## 2015-12-18 ENCOUNTER — Ambulatory Visit
Admission: RE | Admit: 2015-12-18 | Discharge: 2015-12-18 | Disposition: A | Discharge status: Home / Self Care | Payer: Medicare Other | Source: Ambulatory Visit

## 2015-12-18 DIAGNOSIS — Z1231 Encounter for screening mammogram for malignant neoplasm of breast: Secondary | ICD-10-CM

## 2016-02-04 ENCOUNTER — Emergency Department (HOSPITAL_COMMUNITY): Payer: Medicare Other

## 2016-02-04 ENCOUNTER — Encounter (HOSPITAL_COMMUNITY): Payer: Self-pay | Admitting: Nurse Practitioner

## 2016-02-04 ENCOUNTER — Other Ambulatory Visit: Payer: Self-pay

## 2016-02-04 ENCOUNTER — Observation Stay (HOSPITAL_COMMUNITY)
Admission: EM | Admit: 2016-02-04 | Discharge: 2016-02-05 | Disposition: A | Discharge status: Home / Self Care | Payer: Medicare Other | Attending: Family Medicine | Admitting: Family Medicine

## 2016-02-04 DIAGNOSIS — I2510 Atherosclerotic heart disease of native coronary artery without angina pectoris: Secondary | ICD-10-CM | POA: Diagnosis not present

## 2016-02-04 DIAGNOSIS — K219 Gastro-esophageal reflux disease without esophagitis: Principal | ICD-10-CM | POA: Diagnosis present

## 2016-02-04 DIAGNOSIS — R079 Chest pain, unspecified: Secondary | ICD-10-CM | POA: Diagnosis not present

## 2016-02-04 DIAGNOSIS — Z7982 Long term (current) use of aspirin: Secondary | ICD-10-CM | POA: Insufficient documentation

## 2016-02-04 DIAGNOSIS — I252 Old myocardial infarction: Secondary | ICD-10-CM | POA: Insufficient documentation

## 2016-02-04 DIAGNOSIS — E785 Hyperlipidemia, unspecified: Secondary | ICD-10-CM | POA: Diagnosis present

## 2016-02-04 DIAGNOSIS — F17210 Nicotine dependence, cigarettes, uncomplicated: Secondary | ICD-10-CM | POA: Diagnosis not present

## 2016-02-04 DIAGNOSIS — I10 Essential (primary) hypertension: Secondary | ICD-10-CM | POA: Diagnosis present

## 2016-02-04 HISTORY — DX: Chest pain, unspecified: R07.9

## 2016-02-04 HISTORY — DX: Panic disorder (episodic paroxysmal anxiety): F41.0

## 2016-02-04 LAB — CBC
HCT: 39.9 % (ref 36.0–46.0)
HCT: 41.7 % (ref 36.0–46.0)
Hemoglobin: 13.8 g/dL (ref 12.0–15.0)
Hemoglobin: 14.2 g/dL (ref 12.0–15.0)
MCH: 32.7 pg (ref 26.0–34.0)
MCH: 32.6 pg (ref 26.0–34.0)
MCHC: 34.6 g/dL (ref 30.0–36.0)
MCHC: 34.1 g/dL (ref 30.0–36.0)
MCV: 94.5 fL (ref 78.0–100.0)
MCV: 95.6 fL (ref 78.0–100.0)
Platelets: 242 10*3/uL (ref 150–400)
Platelets: 244 10*3/uL (ref 150–400)
RBC: 4.22 MIL/uL (ref 3.87–5.11)
RBC: 4.36 MIL/uL (ref 3.87–5.11)
RDW: 13.7 % (ref 11.5–15.5)
RDW: 13.6 % (ref 11.5–15.5)
WBC: 5.1 10*3/uL (ref 4.0–10.5)
WBC: 5.5 10*3/uL (ref 4.0–10.5)

## 2016-02-04 LAB — HEPATIC FUNCTION PANEL
ALT: 14 U/L (ref 14–54)
AST: 17 U/L (ref 15–41)
Albumin: 3.2 g/dL — ABNORMAL LOW (ref 3.5–5.0)
Alkaline Phosphatase: 77 U/L (ref 38–126)
Bilirubin, Direct: <0.1 mg/dL — ABNORMAL LOW (ref 0.1–0.5)
Total Bilirubin: 0.4 mg/dL (ref 0.3–1.2)
Total Protein: 5.9 g/dL — ABNORMAL LOW (ref 6.5–8.1)

## 2016-02-04 LAB — I-STAT TROPONIN, ED: Troponin i, poc: 0 ng/mL (ref 0.00–0.08)

## 2016-02-04 LAB — BASIC METABOLIC PANEL
Anion gap: 9 (ref 5–15)
BUN: 9 mg/dL (ref 6–20)
CO2: 25 mmol/L (ref 22–32)
Calcium: 9.5 mg/dL (ref 8.9–10.3)
Chloride: 105 mmol/L (ref 101–111)
Creatinine, Ser: 0.85 mg/dL (ref 0.44–1.00)
GFR calc Af Amer: >60 mL/min (ref >60)
GFR calc non Af Amer: >60 mL/min (ref >60)
Glucose, Bld: 112 mg/dL — ABNORMAL HIGH (ref 65–99)
Potassium: 4 mmol/L (ref 3.5–5.1)
Sodium: 139 mmol/L (ref 135–145)

## 2016-02-04 LAB — CREATININE, SERUM
Creatinine, Ser: 0.89 mg/dL (ref 0.44–1.00)
GFR calc Af Amer: >60 mL/min (ref >60)
GFR calc non Af Amer: >60 mL/min (ref >60)

## 2016-02-04 LAB — TROPONIN I
Troponin I: <0.03 ng/mL (ref <0.031)
Troponin I: <0.03 ng/mL (ref <0.031)
Troponin I: <0.03 ng/mL (ref <0.031)

## 2016-02-04 MED ORDER — BUPROPION HCL ER (SR) 150 MG PO TB12
150.0000 mg | ORAL_TABLET | Freq: Every day | ORAL | Status: DC
  Administered 2016-02-04 – 2016-02-05 (×2): 150 mg via ORAL
  Filled 2016-02-04 (×4): qty 1

## 2016-02-04 MED ORDER — FENTANYL CITRATE (PF) 100 MCG/2ML IJ SOLN
50.0000 ug | INTRAMUSCULAR | Status: DC | PRN
  Administered 2016-02-04: 50 ug via INTRAVENOUS
  Filled 2016-02-04: qty 2

## 2016-02-04 MED ORDER — SIMVASTATIN 20 MG PO TABS
20.0000 mg | ORAL_TABLET | Freq: Every evening | ORAL | Status: DC
  Administered 2016-02-04: 20 mg via ORAL
  Filled 2016-02-04: qty 1

## 2016-02-04 MED ORDER — CITALOPRAM HYDROBROMIDE 20 MG PO TABS
20.0000 mg | ORAL_TABLET | Freq: Every day | ORAL | Status: DC
  Administered 2016-02-04 – 2016-02-05 (×2): 20 mg via ORAL
  Filled 2016-02-04 (×2): qty 1

## 2016-02-04 MED ORDER — SODIUM CHLORIDE 0.9 % IV BOLUS (SEPSIS)
500.0000 mL | Freq: Once | INTRAVENOUS | Status: AC
Start: 2016-02-04 — End: 2016-02-05
  Administered 2016-02-05: 500 mL via INTRAVENOUS

## 2016-02-04 MED ORDER — TEMAZEPAM 15 MG PO CAPS
15.0000 mg | ORAL_CAPSULE | Freq: Every evening | ORAL | Status: DC | PRN
  Administered 2016-02-04: 15 mg via ORAL
  Filled 2016-02-04: qty 1

## 2016-02-04 MED ORDER — OXYCODONE HCL 5 MG PO TABS
5.0000 mg | ORAL_TABLET | ORAL | Status: DC | PRN
  Administered 2016-02-04 (×2): 5 mg via ORAL
  Filled 2016-02-04 (×2): qty 1

## 2016-02-04 MED ORDER — ONDANSETRON HCL 4 MG/2ML IJ SOLN: 4.0000 mg | Freq: Four times a day (QID) | INTRAMUSCULAR | Status: DC | PRN

## 2016-02-04 MED ORDER — RANOLAZINE ER 500 MG PO TB12
500.0000 mg | ORAL_TABLET | Freq: Two times a day (BID) | ORAL | Status: DC
  Administered 2016-02-04 – 2016-02-05 (×2): 500 mg via ORAL
  Filled 2016-02-04 (×2): qty 1

## 2016-02-04 MED ORDER — ISOSORBIDE MONONITRATE ER 60 MG PO TB24
60.0000 mg | ORAL_TABLET | Freq: Every day | ORAL | Status: DC
  Administered 2016-02-04 – 2016-02-05 (×2): 60 mg via ORAL
  Filled 2016-02-04 (×2): qty 1

## 2016-02-04 MED ORDER — KETOROLAC TROMETHAMINE 30 MG/ML IJ SOLN
30.0000 mg | Freq: Once | INTRAMUSCULAR | Status: AC
  Administered 2016-02-04: 30 mg via INTRAVENOUS
  Filled 2016-02-04: qty 1

## 2016-02-04 MED ORDER — ASPIRIN EC 81 MG PO TBEC
81.0000 mg | DELAYED_RELEASE_TABLET | Freq: Every day | ORAL | Status: DC
  Administered 2016-02-05: 81 mg via ORAL
  Filled 2016-02-04: qty 1

## 2016-02-04 MED ORDER — CLONAZEPAM 0.5 MG PO TABS
0.5000 mg | ORAL_TABLET | Freq: Every day | ORAL | Status: DC
  Administered 2016-02-04 – 2016-02-05 (×2): 0.5 mg via ORAL
  Filled 2016-02-04 (×2): qty 1

## 2016-02-04 MED ORDER — GI COCKTAIL ~~LOC~~: 30.0000 mL | Freq: Four times a day (QID) | ORAL | Status: DC | PRN

## 2016-02-04 MED ORDER — FAMOTIDINE 20 MG PO TABS
20.0000 mg | ORAL_TABLET | Freq: Every day | ORAL | Status: DC
  Administered 2016-02-04: 20 mg via ORAL
  Filled 2016-02-04: qty 1

## 2016-02-04 MED ORDER — PANTOPRAZOLE SODIUM 40 MG PO TBEC
40.0000 mg | DELAYED_RELEASE_TABLET | Freq: Two times a day (BID) | ORAL | Status: DC
  Administered 2016-02-04 – 2016-02-05 (×2): 40 mg via ORAL
  Filled 2016-02-04 (×2): qty 1

## 2016-02-04 MED ORDER — CALCIUM CARBONATE-VITAMIN D 500-200 MG-UNIT PO TABS
1.0000 | ORAL_TABLET | Freq: Two times a day (BID) | ORAL | Status: DC
  Administered 2016-02-04: 1 via ORAL
  Filled 2016-02-04: qty 1

## 2016-02-04 MED ORDER — FERROUS SULFATE 325 (65 FE) MG PO TABS
325.0000 mg | ORAL_TABLET | Freq: Every day | ORAL | Status: DC
  Administered 2016-02-05: 325 mg via ORAL
  Filled 2016-02-04: qty 1

## 2016-02-04 MED ORDER — ACETAMINOPHEN 325 MG PO TABS: 650.0000 mg | ORAL_TABLET | ORAL | Status: DC | PRN

## 2016-02-04 MED ORDER — NITROGLYCERIN 0.4 MG SL SUBL: 0.4000 mg | SUBLINGUAL_TABLET | SUBLINGUAL | Status: DC | PRN

## 2016-02-04 MED ORDER — ENOXAPARIN SODIUM 40 MG/0.4ML ~~LOC~~ SOLN
40.0000 mg | SUBCUTANEOUS | Status: DC
  Administered 2016-02-04: 40 mg via SUBCUTANEOUS
  Filled 2016-02-04: qty 0.4

## 2016-02-04 MED ORDER — RAMIPRIL 5 MG PO CAPS
5.0000 mg | ORAL_CAPSULE | Freq: Every day | ORAL | Status: DC
  Administered 2016-02-04: 5 mg via ORAL
  Filled 2016-02-04: qty 1

## 2016-02-04 MED ORDER — MORPHINE SULFATE (PF) 4 MG/ML IV SOLN
4.0000 mg | INTRAVENOUS | Status: DC | PRN
  Administered 2016-02-04: 4 mg via INTRAVENOUS
  Filled 2016-02-04: qty 1

## 2016-02-04 NOTE — H&P (Signed)
History and Physical:    [name]   [id number] DOB: 07-10-1946 DOA: 02/04/2016  Referring MD/provider: Dr. Reather Converse PCP: [name], MD   Patient coming from: Home   Chief Complaint: Chest pain  History of Present Illness:   [name] is an 70 y.o. female with a PMH of of CAD s/p remote stenting to RCA and PTCA of LAD, GERD, HLD, HTN and anxiety who presents with non exertional chest pain, rated 8/10 in intensity, described as cramping, "like in a knot".  Pain was associated with diaphoresis and feeling of pre-syncope but no SOB.  On daily aspirin. H/O similar pain in the past, about 4 months ago.  No cardiac work up done at that time.  ED Course: 12 lead EKG negative for acute ischemic changes, initial troponin negative. CXR unremarkable.  Pain unrelieved by fentanyl.   ROS:   Review of Systems  Constitutional: Positive for diaphoresis. Negative for fever and chills.  Eyes: Positive for blurred vision.  Respiratory: Negative for cough and shortness of breath.   Cardiovascular: Positive for chest pain and orthopnea. Negative for palpitations, leg swelling and PND.  Gastrointestinal: Positive for heartburn and nausea. Negative for vomiting, abdominal pain, diarrhea, blood in stool and melena.  Genitourinary: Negative.   Musculoskeletal: Positive for back pain and joint pain. Negative for falls.  Skin: Negative.   Neurological: Positive for dizziness, weakness and headaches.  Endo/Heme/Allergies: Bruises/bleeds easily.  Psychiatric/Behavioral: The patient is nervous/anxious.     All other systems were reviewed are are negative.  Past Medical History:   Past Medical History  Diagnosis Date  . Hypertension   . Hypercholesteremia   . GERD (gastroesophageal reflux disease)   . Anxiety   . Hiatal hernia   . Anemia   . Blood transfusion   . Coronary artery disease   . Coronary vasospasm ([place])   . NSVT (nonsustained ventricular tachycardia) ([place])  09/26/2013  . Coronary artery spasm, wth continued episodes of chest pain.  09/26/2013  . NSTEMI (non-ST elevated myocardial infarction) ([place])   . Panic attack   . Chest pain 02/04/2016    Past Surgical History:   Past Surgical History  Procedure Laterality Date  . Cardiac stent  2012    to RCA  . Shoulder surgery    . Breast      right, tumor   . Knee surgery  left  . Cardiac catheterization  2014  . Cardiac catheterization  2012  . Left heart catheterization with coronary angiogram N/A 02/22/2013    Procedure: LEFT HEART CATHETERIZATION WITH CORONARY ANGIOGRAM;  Surgeon: [name], MD;  Location: [facility] CATH LAB;  Service: Cardiovascular;  Laterality: N/A;  . Abdominal hysterectomy      PARTIAL HYSTERECTOMY    Social History:   Social History   Social History  . Marital Status: Married    Spouse Name: [name]  . Number of Children: 0  . Years of Education: N/A   Occupational History  . Not on file.   Social History Main Topics  . Smoking status: Current Every Day Smoker -- 1.00 packs/day for 40 years    Types: Cigarettes  . Smokeless tobacco: Former Systems developer     Comment: " [place] "  . Alcohol Use: 0.6 oz/week    1 Cans of beer per week     Comment: occasional  . Drug Use: No  . Sexual Activity: No   Other Topics Concern  . Not on file  Social History Narrative   Lives with husband. Ambulates independently.    Allergies   Review of patient's allergies indicates no known allergies.  Family history:   Family History  Problem Relation Age of Onset  . Stroke Sister   . Heart attack    . Heart attack Neg Hx   . Heart failure Mother     Current Medications:   Prior to Admission medications   Medication Sig Start Date End Date Taking? Authorizing Provider  aspirin EC 81 MG tablet Take 81 mg by mouth daily.   Yes Historical Provider, MD  buPROPion (ZYBAN) 150 MG 12 hr tablet Take 150 mg by mouth daily. 01/29/16  Yes Historical Provider, MD    calcium-vitamin D (OSCAL WITH D) 500-200 MG-UNIT per tablet Take 1 tablet by mouth 2 (two) times daily.   Yes Historical Provider, MD  citalopram (CELEXA) 20 MG tablet Take 20 mg by mouth daily.   Yes Historical Provider, MD  clonazePAM (KLONOPIN) 0.5 MG tablet Take 0.5 mg by mouth daily as needed for anxiety. For anxiety   Yes Historical Provider, MD  ferrous sulfate 325 (65 FE) MG tablet Take 325 mg by mouth daily with breakfast.   Yes Historical Provider, MD  isosorbide mononitrate (IMDUR) 60 MG 24 hr tablet Take 1 tablet (60 mg total) by mouth daily. 10/23/14  Yes Rhonda G Barrett, PA-C  nitroGLYCERIN (NITROSTAT) 0.4 MG SL tablet Place 1 tablet (0.4 mg total) under the tongue every 5 (five) minutes as needed for chest pain. 10/23/14  Yes Rhonda G Barrett, PA-C  pantoprazole (PROTONIX) 40 MG tablet Take 1 tablet (40 mg total) by mouth 2 (two) times daily. 04/20/14  Yes Isaiah Serge, NP  ramipril (ALTACE) 5 MG capsule Take 5 mg by mouth daily. 12/01/15  Yes Historical Provider, MD  RANEXA 500 MG 12 hr tablet TAKE 1 TABLET(500 MG) BY MOUTH TWICE DAILY 11/22/15  Yes Belva Crome, MD  ranitidine (ZANTAC) 150 MG tablet Take 150 mg by mouth 2 (two) times daily.   Yes Historical Provider, MD  simvastatin (ZOCOR) 20 MG tablet Take 1 tablet (20 mg total) by mouth every evening. 05/04/14  Yes Scott T Weaver, PA-C  temazepam (RESTORIL) 15 MG capsule Take 15 mg by mouth at bedtime as needed for sleep.  02/07/14  Yes Historical Provider, MD  NITROSTAT 0.4 MG SL tablet DISSOLVE 1 TABLET UNDER TONGUE AS NEEDED FOR CHEST PAIN EVERY 5 MINUTES UP TO 3 DOSES 04/02/15   Belva Crome, MD  tiZANidine (ZANAFLEX) 4 MG tablet Take 4 mg by mouth every 6 (six) hours as needed for muscle spasms.  02/07/14   Historical Provider, MD    Physical Exam:   Filed Vitals:   02/04/16 1530 02/04/16 1615 02/04/16 1639 02/04/16 1800  BP: 136/71 139/71  131/79  Pulse: 55 48    Temp:    98.7 F (37.1 C)  TempSrc:    Oral  Resp: 16  15  18   Height:   4\' 11"  (1.499 m)   Weight:   54.704 kg (120 lb 9.6 oz)   SpO2: 98% 98%  100%     Physical Exam: Blood pressure 131/79, pulse 48, temperature 98.7 F (37.1 C), temperature source Oral, resp. rate 18, height 4\' 11"  (1.499 m), weight 54.704 kg (120 lb 9.6 oz), SpO2 100 %. Gen: No acute distress. Head: Normocephalic, atraumatic. Eyes: Pupils equal, round and reactive to light. Extraocular movements intact.  Sclerae nonicteric. Mouth: Oropharynx reveals poor  dentition. Neck: Supple, no thyromegaly, no lymphadenopathy, no jugular venous distention. Chest: Lungs are clear to auscultation with good air movement. No rales, rhonchi or wheezes.  CV: Heart sounds are regular with an S1, S2. No murmurs, rubs, clicks, or gallops.  Abdomen: Soft, nontender, nondistended with normal active bowel sounds. Extremities: Extremities are without clubbing, edema, or cyanosis. Pedal pulses 2+.  Skin: Warm and dry. No rashes, lesions or wounds. Neuro: Alert and oriented times 3; grossly nonfocal.  Psych: Insight is good and judgment is appropriate. Mood and affect normal.   Data Review:    Labs: Basic Metabolic Panel:  Recent Labs Lab 02/04/16 1335 02/04/16 1701  NA 139  --   K 4.0  --   CL 105  --   CO2 25  --   GLUCOSE 112*  --   BUN 9  --   CREATININE 0.85 0.89  CALCIUM 9.5  --    Liver Function Tests: No results for input(s): AST, ALT, ALKPHOS, BILITOT, PROT, ALBUMIN in the last 168 hours. No results for input(s): LIPASE, AMYLASE in the last 168 hours. No results for input(s): AMMONIA in the last 168 hours. CBC:  Recent Labs Lab 02/04/16 1335 02/04/16 1701  WBC 5.1 5.5  HGB 13.8 14.2  HCT 39.9 41.7  MCV 94.5 95.6  PLT 242 244   Cardiac Enzymes:  Recent Labs Lab 02/04/16 1701  TROPONINI <0.03    BNP (last 3 results) No results for input(s): PROBNP in the last 8760 hours. CBG: No results for input(s): GLUCAP in the last 168 hours.  Urinalysis     Component Value Date/Time   COLORURINE YELLOW 03/09/2013 1215   APPEARANCEUR HAZY* 03/09/2013 1215   LABSPEC 1.027 03/09/2013 1215   PHURINE 5.5 03/09/2013 1215   GLUCOSEU NEGATIVE 03/09/2013 1215   HGBUR NEGATIVE 03/09/2013 1215   BILIRUBINUR SMALL* 03/09/2013 1215   KETONESUR 15* 03/09/2013 1215   PROTEINUR NEGATIVE 03/09/2013 1215   UROBILINOGEN 1.0 03/09/2013 1215   NITRITE NEGATIVE 03/09/2013 1215   LEUKOCYTESUR SMALL* 03/09/2013 1215      Radiographic Studies: Dg Chest 2 View  02/04/2016  CLINICAL DATA:  Chest pain EXAM: CHEST  2 VIEW COMPARISON:  06/29/2015 FINDINGS: Normal heart size and mediastinal contours. No acute infiltrate or edema. No effusion or pneumothorax. Lower cervical ACDF. No acute osseous findings. IMPRESSION: No active cardiopulmonary disease. Electronically Signed   By: Monte Fantasia M.D.   On: 02/04/2016 14:02    EKG: Independently reviewed. QTc 491 ms. NSR.  No ischemic changes.   Assessment/Plan:   Principal Problem:   Acute chest pain in a patient with CAD in native artery Heart score = 4-5.  Admit to tele.  Cycle troponins.  Continue ASA. Cardiology consult for stress test. Continue Imdur, Ranexa and SL NTG as needed.  Active Problems:   HTN (hypertension) Continue Altace.    GERD (gastroesophageal reflux disease) On both an H2 blocker and a PPI.    Dyslipidemia Continue Zocor, check FLP.    Other information:   DVT prophylaxis: Lovenox ordered. Code Status: Full code. Family Communication: [name] (husband) at the bedside. Disposition Plan: Home tomorrow if stress test negative. Consults called: Cardiology notified through [facility] Message sent to [facility]. Admission status: Observation.   Time spent: 1 hour.  Giulia Hickey [facility] Pager [phone] Cell: [phone]   If 7PM-7AM, please contact night-coverage [email or web address] Password [facility] 02/04/2016, 8:31 PM

## 2016-02-04 NOTE — ED Notes (Signed)
Per EMS pt from home have been having central chest pain radiating to right arm starting this morning and worsening with exertion and progressing throughout today. Denies dizziness, shortness of breath, nausea or lightheadedness.   Pt has had 324 ASA and 2 nitro with no relief.

## 2016-02-04 NOTE — ED Notes (Signed)
Discussed tele placement w/ Dr. Rockne Menghini - pt CP is believed to be musculoskeletal and tele bed is appropriate for pt.

## 2016-02-04 NOTE — Progress Notes (Signed)
NP on call notified of pt's bp being in the 70s/40s. Pt is asymptomatic. New orders for 500 cc bolus. Will continue to monitor the pt. Hoover Brunette, RN

## 2016-02-05 DIAGNOSIS — E785 Hyperlipidemia, unspecified: Secondary | ICD-10-CM | POA: Diagnosis not present

## 2016-02-05 DIAGNOSIS — I2510 Atherosclerotic heart disease of native coronary artery without angina pectoris: Secondary | ICD-10-CM | POA: Diagnosis not present

## 2016-02-05 DIAGNOSIS — I10 Essential (primary) hypertension: Secondary | ICD-10-CM | POA: Diagnosis not present

## 2016-02-05 DIAGNOSIS — R072 Precordial pain: Secondary | ICD-10-CM | POA: Diagnosis not present

## 2016-02-05 DIAGNOSIS — K219 Gastro-esophageal reflux disease without esophagitis: Secondary | ICD-10-CM

## 2016-02-05 DIAGNOSIS — R079 Chest pain, unspecified: Secondary | ICD-10-CM

## 2016-02-05 LAB — LIPID PANEL
Cholesterol: 158 mg/dL (ref 0–200)
HDL: 73 mg/dL (ref >40)
LDL Cholesterol: 64 mg/dL (ref 0–99)
Total CHOL/HDL Ratio: 2.2 RATIO
Triglycerides: 103 mg/dL (ref <150)
VLDL: 21 mg/dL (ref 0–40)

## 2016-02-05 LAB — TSH: TSH: 1.242 u[IU]/mL (ref 0.350–4.500)

## 2016-02-05 MED ORDER — RAMIPRIL 2.5 MG PO CAPS: 5.0000 mg | ORAL_CAPSULE | Freq: Every day | ORAL | Status: DC

## 2016-02-05 NOTE — Discharge Instructions (Signed)
Nonspecific Chest Pain  Chest pain can be caused by many different conditions. There is always a chance that your pain could be related to something serious, such as a heart attack or a blood clot in your lungs. Chest pain can also be caused by conditions that are not life-threatening. If you have chest pain, it is very important to follow up with your health care provider. CAUSES  Chest pain can be caused by:  Heartburn.  Pneumonia or bronchitis.  Anxiety or stress.  Inflammation around your heart (pericarditis) or lung (pleuritis or pleurisy).  A blood clot in your lung.  A collapsed lung (pneumothorax). It can develop suddenly on its own (spontaneous pneumothorax) or from trauma to the chest.  Shingles infection (varicella-zoster virus).  Heart attack.  Damage to the bones, muscles, and cartilage that make up your chest wall. This can include:  Bruised bones due to injury.  Strained muscles or cartilage due to frequent or repeated coughing or overwork.  Fracture to one or more ribs.  Sore cartilage due to inflammation (costochondritis). RISK FACTORS  Risk factors for chest pain may include:  Activities that increase your risk for trauma or injury to your chest.  Respiratory infections or conditions that cause frequent coughing.  Medical conditions or overeating that can cause heartburn.  Heart disease or family history of heart disease.  Conditions or health behaviors that increase your risk of developing a blood clot.  Having had chicken pox (varicella zoster). SIGNS AND SYMPTOMS Chest pain can feel like:  Burning or tingling on the surface of your chest or deep in your chest.  Crushing, pressure, aching, or squeezing pain.  Dull or sharp pain that is worse when you move, cough, or take a deep breath.  Pain that is also felt in your back, neck, shoulder, or arm, or pain that spreads to any of these areas. Your chest pain may come and go, or it may stay  constant. DIAGNOSIS Lab tests or other studies may be needed to find the cause of your pain. Your health care provider may have you take a test called an ambulatory ECG (electrocardiogram). An ECG records your heartbeat patterns at the time the test is performed. You may also have other tests, such as:  Transthoracic echocardiogram (TTE). During echocardiography, sound waves are used to create a picture of all of the heart structures and to look at how blood flows through your heart.  Transesophageal echocardiogram (TEE).This is a more advanced imaging test that obtains images from inside your body. It allows your health care provider to see your heart in finer detail.  Cardiac monitoring. This allows your health care provider to monitor your heart rate and rhythm in real time.  Holter monitor. This is a portable device that records your heartbeat and can help to diagnose abnormal heartbeats. It allows your health care provider to track your heart activity for several days, if needed.  Stress tests. These can be done through exercise or by taking medicine that makes your heart beat more quickly.  Blood tests.  Imaging tests. TREATMENT  Your treatment depends on what is causing your chest pain. Treatment may include:  Medicines. These may include:  Acid blockers for heartburn.  Anti-inflammatory medicine.  Pain medicine for inflammatory conditions.  Antibiotic medicine, if an infection is present.  Medicines to dissolve blood clots.  Medicines to treat coronary artery disease.  Supportive care for conditions that do not require medicines. This may include:  Resting.  Applying heat  or cold packs to injured areas.  Limiting activities until pain decreases. HOME CARE INSTRUCTIONS  If you were prescribed an antibiotic medicine, finish it all even if you start to feel better.  Avoid any activities that bring on chest pain.  Do not use any tobacco products, including  cigarettes, chewing tobacco, or electronic cigarettes. If you need help quitting, ask your health care provider.  Do not drink alcohol.  Take medicines only as directed by your health care provider.  Keep all follow-up visits as directed by your health care provider. This is important. This includes any further testing if your chest pain does not go away.  If heartburn is the cause for your chest pain, you may be told to keep your head raised (elevated) while sleeping. This reduces the chance that acid will go from your stomach into your esophagus.  Make lifestyle changes as directed by your health care provider. These may include:  Getting regular exercise. Ask your health care provider to suggest some activities that are safe for you.  Eating a heart-healthy diet. A registered dietitian can help you to learn healthy eating options.  Maintaining a healthy weight.  Managing diabetes, if necessary.  Reducing stress. SEEK MEDICAL CARE IF:  Your chest pain does not go away after treatment.  You have a rash with blisters on your chest.  You have a fever. SEEK IMMEDIATE MEDICAL CARE IF:   Your chest pain is worse.  You have an increasing cough, or you cough up blood.  You have severe abdominal pain.  You have severe weakness.  You faint.  You have chills.  You have sudden, unexplained chest discomfort.  You have sudden, unexplained discomfort in your arms, back, neck, or jaw.  You have shortness of breath at any time.  You suddenly start to sweat, or your skin gets clammy.  You feel nauseous or you vomit.  You suddenly feel light-headed or dizzy.  Your heart begins to beat quickly, or it feels like it is skipping beats. These symptoms may represent a serious problem that is an emergency. Do not wait to see if the symptoms will go away. Get medical help right away. Call your local emergency services (911 in the [place] Do not drive yourself to the hospital.   This  information is not intended to replace advice given to you by your health care provider. Make sure you discuss any questions you have with your health care provider.   Document Released: 05/07/2005 Document Revised: 08/18/2014 Document Reviewed: 03/03/2014 Elsevier Interactive Patient Education 2016 [employer].   Smoking Cessation, Tips for Success If you are ready to quit smoking, congratulations! You have chosen to help yourself be healthier. Cigarettes bring nicotine, tar, carbon monoxide, and other irritants into your body. Your lungs, heart, and blood vessels will be able to work better without these poisons. There are many different ways to quit smoking. Nicotine gum, nicotine patches, a nicotine inhaler, or nicotine nasal spray can help with physical craving. Hypnosis, support groups, and medicines help break the habit of smoking. WHAT THINGS CAN I DO TO MAKE QUITTING EASIER?  Here are some tips to help you quit for good:  Pick a date when you will quit smoking completely. Tell all of your friends and family about your plan to quit on that date.  Do not try to slowly cut down on the number of cigarettes you are smoking. Pick a quit date and quit smoking completely starting on that day.  Throw  away all cigarettes.   Clean and remove all ashtrays from your home, work, and car.  On a card, write down your reasons for quitting. Carry the card with you and read it when you get the urge to smoke.  Cleanse your body of nicotine. Drink enough water and fluids to keep your urine clear or pale yellow. Do this after quitting to flush the nicotine from your body.  Learn to predict your moods. Do not let a bad situation be your excuse to have a cigarette. Some situations in your life might tempt you into wanting a cigarette.  Never have "just one" cigarette. It leads to wanting another and another. Remind yourself of your decision to quit.  Change habits associated with smoking. If you  smoked while driving or when feeling stressed, try other activities to replace smoking. Stand up when drinking your coffee. Brush your teeth after eating. Sit in a different chair when you read the paper. Avoid alcohol while trying to quit, and try to drink fewer caffeinated beverages. Alcohol and caffeine may urge you to smoke.  Avoid foods and drinks that can trigger a desire to smoke, such as sugary or spicy foods and alcohol.  Ask people who smoke not to smoke around you.  Have something planned to do right after eating or having a cup of coffee. For example, plan to take a walk or exercise.  Try a relaxation exercise to calm you down and decrease your stress. Remember, you may be tense and nervous for the first 2 weeks after you quit, but this will pass.  Find new activities to keep your hands busy. Play with a pen, coin, or rubber band. Doodle or draw things on paper.  Brush your teeth right after eating. This will help cut down on the craving for the taste of tobacco after meals. You can also try mouthwash.   Use oral substitutes in place of cigarettes. Try using lemon drops, carrots, cinnamon sticks, or chewing gum. Keep them handy so they are available when you have the urge to smoke.  When you have the urge to smoke, try deep breathing.  Designate your home as a nonsmoking area.  If you are a heavy smoker, ask your health care provider about a prescription for nicotine chewing gum. It can ease your withdrawal from nicotine.  Reward yourself. Set aside the cigarette money you save and buy yourself something nice.  Look for support from others. Join a support group or smoking cessation program. Ask someone at home or at work to help you with your plan to quit smoking.  Always ask yourself, "Do I need this cigarette or is this just a reflex?" Tell yourself, "Today, I choose not to smoke," or "I do not want to smoke." You are reminding yourself of your decision to quit.  Do not  replace cigarette smoking with electronic cigarettes (commonly called e-cigarettes). The safety of e-cigarettes is unknown, and some may contain harmful chemicals.  If you relapse, do not give up! Plan ahead and think about what you will do the next time you get the urge to smoke. HOW WILL I FEEL WHEN I QUIT SMOKING? You may have symptoms of withdrawal because your body is used to nicotine (the addictive substance in cigarettes). You may crave cigarettes, be irritable, feel very hungry, cough often, get headaches, or have difficulty concentrating. The withdrawal symptoms are only temporary. They are strongest when you first quit but will go away within 10-14 days. When withdrawal  symptoms occur, stay in control. Think about your reasons for quitting. Remind yourself that these are signs that your body is healing and getting used to being without cigarettes. Remember that withdrawal symptoms are easier to treat than the major diseases that smoking can cause.  Even after the withdrawal is over, expect periodic urges to smoke. However, these cravings are generally short lived and will go away whether you smoke or not. Do not smoke! WHAT RESOURCES ARE AVAILABLE TO HELP ME QUIT SMOKING? Your health care provider can direct you to community resources or hospitals for support, which may include:  Group support.  Education.  Hypnosis.  Therapy.   This information is not intended to replace advice given to you by your health care provider. Make sure you discuss any questions you have with your health care provider.   Document Released: 04/25/2004 Document Revised: 08/18/2014 Document Reviewed: 01/13/2013 Elsevier Interactive Patient Education 2016 [employer].   Steps to Quit Smoking  Smoking tobacco can be harmful to your health and can affect almost every organ in your body. Smoking puts you, and those around you, at risk for developing many serious chronic diseases. Quitting smoking is difficult,  but it is one of the best things that you can do for your health. It is never too late to quit. WHAT ARE THE BENEFITS OF QUITTING SMOKING? When you quit smoking, you lower your risk of developing serious diseases and conditions, such as:  Lung cancer or lung disease, such as COPD.  Heart disease.  Stroke.  Heart attack.  Infertility.  Osteoporosis and bone fractures. Additionally, symptoms such as coughing, wheezing, and shortness of breath may get better when you quit. You may also find that you get sick less often because your body is stronger at fighting off colds and infections. If you are pregnant, quitting smoking can help to reduce your chances of having a baby of low birth weight. HOW DO I GET READY TO QUIT? When you decide to quit smoking, create a plan to make sure that you are successful. Before you quit:  Pick a date to quit. Set a date within the next two weeks to give you time to prepare.  Write down the reasons why you are quitting. Keep this list in places where you will see it often, such as on your bathroom mirror or in your car or wallet.  Identify the people, places, things, and activities that make you want to smoke (triggers) and avoid them. Make sure to take these actions:  Throw away all cigarettes at home, at work, and in your car.  Throw away smoking accessories, such as Scientist, research (medical).  Clean your car and make sure to empty the ashtray.  Clean your home, including curtains and carpets.  Tell your family, friends, and coworkers that you are quitting. Support from your loved ones can make quitting easier.  Talk with your health care provider about your options for quitting smoking.  Find out what treatment options are covered by your health insurance. WHAT STRATEGIES CAN I USE TO QUIT SMOKING?  Talk with your healthcare provider about different strategies to quit smoking. Some strategies include:  Quitting smoking altogether instead of  gradually lessening how much you smoke over a period of time. Research shows that quitting "cold [place]" is more successful than gradually quitting.  Attending in-person counseling to help you build problem-solving skills. You are more likely to have success in quitting if you attend several counseling sessions. Even short sessions  of 10 minutes can be effective.  Finding resources and support systems that can help you to quit smoking and remain smoke-free after you quit. These resources are most helpful when you use them often. They can include:  Online chats with a Veterinary surgeoncounselor.  Telephone quitlines.  Printed Materials engineerself-help materials.  Support groups or group counseling.  Text messaging programs.  Mobile phone applications.  Taking medicines to help you quit smoking. (If you are pregnant or breastfeeding, talk with your health care provider first.) Some medicines contain nicotine and some do not. Both types of medicines help with cravings, but the medicines that include nicotine help to relieve withdrawal symptoms. Your health care provider may recommend:  Nicotine patches, gum, or lozenges.  Nicotine inhalers or sprays.  Non-nicotine medicine that is taken by mouth. Talk with your health care provider about combining strategies, such as taking medicines while you are also receiving in-person counseling. Using these two strategies together makes you more likely to succeed in quitting than if you used either strategy on its own. If you are pregnant or breastfeeding, talk with your health care provider about finding counseling or other support strategies to quit smoking. Do not take medicine to help you quit smoking unless told to do so by your health care provider. WHAT THINGS CAN I DO TO MAKE IT EASIER TO QUIT? Quitting smoking might feel overwhelming at first, but there is a lot that you can do to make it easier. Take these important actions:  Reach out to your family and friends and ask  that they support and encourage you during this time. Call telephone quitlines, reach out to support groups, or work with a counselor for support.  Ask people who smoke to avoid smoking around you.  Avoid places that trigger you to smoke, such as bars, parties, or smoke-break areas at work.  Spend time around people who do not smoke.  Lessen stress in your life, because stress can be a smoking trigger for some people. To lessen stress, try:  Exercising regularly.  Deep-breathing exercises.  Yoga.  Meditating.  Performing a body scan. This involves closing your eyes, scanning your body from head to toe, and noticing which parts of your body are particularly tense. Purposefully relax the muscles in those areas.  Download or purchase mobile phone or tablet apps (applications) that can help you stick to your quit plan by providing reminders, tips, and encouragement. There are many free apps, such as QuitGuide from the [employer] developer(Centers for Disease Control and Prevention). You can find other support for quitting smoking (smoking cessation) through smokefree.gov and other websites. HOW WILL I FEEL WHEN I QUIT SMOKING? Within the first 24 hours of quitting smoking, you may start to feel some withdrawal symptoms. These symptoms are usually most noticeable 2-3 days after quitting, but they usually do not last beyond 2-3 weeks. Changes or symptoms that you might experience include:  Mood swings.  Restlessness, anxiety, or irritation.  Difficulty concentrating.  Dizziness.  Strong cravings for sugary foods in addition to nicotine.  Mild weight gain.  Constipation.  Nausea.  Coughing or a sore throat.  Changes in how your medicines work in your body.  A depressed mood.  Difficulty sleeping (insomnia). After the first 2-3 weeks of quitting, you may start to notice more positive results, such as:  Improved sense of smell and taste.  Decreased coughing and sore throat.  Slower heart  rate.  Lower blood pressure.  Clearer skin.  The ability to breathe  more easily.  Fewer sick days. Quitting smoking is very challenging for most people. Do not get discouraged if you are not successful the first time. Some people need to make many attempts to quit before they achieve long-term success. Do your best to stick to your quit plan, and talk with your health care provider if you have any questions or concerns.   This information is not intended to replace advice given to you by your health care provider. Make sure you discuss any questions you have with your health care provider.   Document Released: 07/22/2001 Document Revised: 12/12/2014 Document Reviewed: 12/12/2014 Elsevier Interactive Patient Education [employer].

## 2016-02-05 NOTE — Discharge Summary (Signed)
Physician Discharge Summary  [name] [id number] DOB: [birth year] DOA: 02/04/2016  PCP: [name], MD  Admit date: 02/04/2016 Discharge date: 02/05/2016  Admitted From: Home Disposition:  Home  Recommendations for Outpatient Follow-up:  1. Follow up with PCP in 1-2 weeks: Please arrange outpatient stress myoview test.   2. Please follow up with cardiology   Discharge Condition: Stable CODE STATUS: Full  Diet recommendation: Heart Healthy  Brief/Interim Summary: [name] is a 70 y.o. female with a history of CAD s/p remote stenting to RCA and PTCA of LAD, recurrent coronary spasms, GERD, HLD, HTN, Anxiety, ongoing tobacco abuse and recurrent syncope with neurally mediated features who presented with chest pain.   Last echo 02/2013 showed LV EF of 50-55%, grade 2 DD and regional WM abnormality. Follow up last cath in 02/2013 with non-obs CAD (40% proximal LAD an eccentric 50% diagonal #1; patent RCA stent and widely patent circumflex) and evidence of vasospasm. Patient has had multiple cardiac catheterizations without significant stenosis. Multiple admission for coronary spasms. Last Myoview 01/2004. Not on BB due to bradycardia. Amlodipine 2.5mg  discontinued 10/2014 admission due to syncope related to hypotension. No syncope episode since then.   He continues to have a recurrent episode of diaphoresis and chest pain with near syncope. This episode takes up to one hour to resolve. She needs to lay down to resolve episodes. She usually needs to take sublingual nitroglycerin 3 times in a week. Recently her episode getting more severe and intense. Yesterday she had a worse episode leading to ER presentation for further evaluation. She rated 8 out of 10. She described the chest pain as a "cramping sensation at epigastric/left sided area code. The patient denies lower extremity edema, orthopnea, PND, syncope, melena, blood in her stool or urine.  Troponin x 3 negative. Lytes normal.  LDL 64. TSH normal. CXR clear. EKG showed sinus rhythm at rate of 54 bpm, TWI in V5 & V6, prolonged QT which is similar to prior EKG. Her BP was normal upon arrival however become hypotensive later --> given 500CC of fluid. BP relatively stable.    1. Chest pain - Seems typical coronary spasm episode, however, this is the worst episodes she had recently. Troponin 3 negative. EKG without acute changes. She is not on optimal therapy for coronary vasospasm due to hypotension. ? Stress test. Inpatient vs outpatient. - Continue an aspirin 81 mg, statin, Imdur and Ranexa.  2. Coronary artery disease status post RCA stent and PTCA of LAD. - Last echo 02/2013 showed LV EF of 50-55%, grade 2 DD and regional WM abnormality. Follow up last cath in 02/2013 with non-obs CAD (40% proximal LAD an eccentric 50% diagonal #1; patent RCA stent and widely patent circumflex) and evidence of vasospasm. Patient has had multiple cardiac catheterizations without significant stenosis.  3. Ongoing tobacco abuse - She has quit before but relapsed. Currently smoking one fourth of pack per day. Trying to quit. Ready to quit: Yes Counseling given: Yes  4. Hypertension - On Imdur 60 mg. Ramipril dose reduced to 2.5 mg due to hypotension.   5. HLD - Continue statin.   Ms. [name] is a 70 year old African-American female who has known CAD and is status post remote stenting to her RCA and PTCA of her LAD. She has had issues with recurrent vasospasm leading to recurrent cardiac catheterizations without significant restenosis. She has a history of GERD, hyperlipidemia, hypertension, anxiety, and she continues to smoke cigarettes and currently smoking close to one half pack per  day. Her last cardiac catheterization was in 2014 which showed 40% proximal LAD narrowing and 50% eccentric stenosis in the first diagonal vessel. Her RCA and circumflex were normal. She had evidence for vasospasm. Remotely, she has had issues  with hypotension and hypotension mediated syncope. She had been on low-dose amlodipine , but this was discontinued. She also had been on ACE inhibition with ramipril 5 mg. Yesterday, she had a recurrent episode of chest cramping which she described as a knot-like sensation in her chest. She had associated diaphoresis. She has taken nitroglycerin for this in the past. She was admitted overnight by the hospitalist. Her troponins are negative. Her ECG reveals sinus bradycardia 54 bpm. There are nondiagnostic T-wave changes in leads V5 and V6. She had mild QTC prolongation at 491 ms. Her blood pressure has been low. She is on numerous medications including Wellbutrin SR, Celexa, and Klonopin. Her cardiac medicines include isosorbide mononitrate at 60 mg in addition to ranolazine 500 mg twice a day and statin therapy with simvastatin 20 mg daily. If possible, it may be beneficial to try to reduce the psychiatric medication. She would benefit from potential reinstitution of very low-dose ACE inhibitor with ramipril 2.5 mg at bedtime, which may help with nitric oxide mediated vasodilation. Continue statin therapy with LDL target less than 70. I had a long discussion with her concerning the absolute importance of complete discontinuance of tobacco which contains nicotine which is a vasoconstrictive agent. The patient can be discharged later today. However, I would recommend a follow-up [place] study as an outpatient to further evaluate her CAD and make certain she she does not have any ischemia.   Discharge Diagnoses:  Principal Problem:   Acute chest pain Active Problems:   HTN (hypertension)   GERD (gastroesophageal reflux disease)   Dyslipidemia   CAD in native artery   Chest pain  Discharge Instructions      Discharge Instructions    Diet - low sodium heart healthy    Complete by:  As directed      Increase activity slowly    Complete by:  As directed              Medication List    TAKE these medications        aspirin EC 81 MG tablet  Take 81 mg by mouth daily.     buPROPion 150 MG 12 hr tablet  Commonly known as:  ZYBAN  Take 150 mg by mouth daily.     calcium-vitamin D 500-200 MG-UNIT tablet  Commonly known as:  OSCAL WITH D  Take 1 tablet by mouth 2 (two) times daily.     citalopram 20 MG tablet  Commonly known as:  CELEXA  Take 20 mg by mouth daily.     clonazePAM 0.5 MG tablet  Commonly known as:  KLONOPIN  Take 0.5 mg by mouth daily as needed for anxiety. For anxiety     ferrous sulfate 325 (65 FE) MG tablet  Take 325 mg by mouth daily with breakfast.     isosorbide mononitrate 60 MG 24 hr tablet  Commonly known as:  IMDUR  Take 1 tablet (60 mg total) by mouth daily.     nitroGLYCERIN 0.4 MG SL tablet  Commonly known as:  NITROSTAT  Place 1 tablet (0.4 mg total) under the tongue every 5 (five) minutes as needed for chest pain.     NITROSTAT 0.4 MG SL tablet  Generic drug:  nitroGLYCERIN  DISSOLVE 1  TABLET UNDER TONGUE AS NEEDED FOR CHEST PAIN EVERY 5 MINUTES UP TO 3 DOSES     pantoprazole 40 MG tablet  Commonly known as:  PROTONIX  Take 1 tablet (40 mg total) by mouth 2 (two) times daily.     ramipril 2.5 MG capsule  Commonly known as:  ALTACE  Take 2 capsules (5 mg total) by mouth at bedtime.     RANEXA 500 MG 12 hr tablet  Generic drug:  ranolazine  TAKE 1 TABLET(500 MG) BY MOUTH TWICE DAILY     ranitidine 150 MG tablet  Commonly known as:  ZANTAC  Take 150 mg by mouth 2 (two) times daily.     simvastatin 20 MG tablet  Commonly known as:  ZOCOR  Take 1 tablet (20 mg total) by mouth every evening.     temazepam 15 MG capsule  Commonly known as:  RESTORIL  Take 15 mg by mouth at bedtime as needed for sleep.     tiZANidine 4 MG tablet  Commonly known as:  ZANAFLEX  Take 4 mg by mouth every 6 (six) hours as needed for muscle spasms.       Follow-up Information    Follow up with [name], MD.  Schedule an appointment as soon as possible for a visit in 1 week.   Specialty:  Internal Medicine   Why:  Hospital Follow Up - schedule outpatient stress myoview test   Contact information:   [address] [place] [address] [phone]       Follow up with [facility]. Schedule an appointment as soon as possible for a visit in 2 weeks.   Why:  Hospital Follow Up - schedule stress myoview test   Contact information:   [place] [phone]      No Known Allergies  Consultations:  cardiology   Procedures/Studies: Dg Chest 2 View  02/04/2016  CLINICAL DATA:  Chest pain EXAM: CHEST  2 VIEW COMPARISON:  06/29/2015 FINDINGS: Normal heart size and mediastinal contours. No acute infiltrate or edema. No effusion or pneumothorax. Lower cervical ACDF. No acute osseous findings. IMPRESSION: No active cardiopulmonary disease. Electronically Signed   By: Monte Fantasia M.D.   On: 02/04/2016 14:02    Subjective: Pt without complaints, says she feels well to go home.   Discharge Exam: Filed Vitals:   02/05/16 0500 02/05/16 1137  BP: 105/56 117/54  Pulse: 49 60  Temp: 97.5 F (36.4 C) 98.2 F (36.8 C)  Resp:     Filed Vitals:   02/04/16 2337 02/05/16 0035 02/05/16 0500 02/05/16 1137  BP: 79/44 90/55 105/56 117/54  Pulse: 60  49 60  Temp: 97.9 F (36.6 C)  97.5 F (36.4 C) 98.2 F (36.8 C)  TempSrc: Oral  Oral Oral  Resp:      Height:      Weight:   122 lb 8 oz (55.566 kg)   SpO2: 93%  95% 93%    General: Pt is alert, awake, not in acute distress Cardiovascular: RRR, S1/S2 +, no rubs, no gallops Respiratory: CTA bilaterally, no wheezing, no rhonchi Abdominal: Soft, NT, ND, bowel sounds + Extremities: no edema, no cyanosis   The results of significant diagnostics from this hospitalization (including imaging, microbiology, ancillary and laboratory) are listed below for reference.     Microbiology: No  results found for this or any previous visit (from the past 240 hour(s)).   Labs: BNP (last 3 results) No results  for input(s): BNP in the last 8760 hours. Basic Metabolic Panel:  Recent Labs Lab 02/04/16 1335 02/04/16 1701  NA 139  --   K 4.0  --   CL 105  --   CO2 25  --   GLUCOSE 112*  --   BUN 9  --   CREATININE 0.85 0.89  CALCIUM 9.5  --    Liver Function Tests:  Recent Labs Lab 02/04/16 1940  AST 17  ALT 14  ALKPHOS 77  BILITOT 0.4  PROT 5.9*  ALBUMIN 3.2*   No results for input(s): LIPASE, AMYLASE in the last 168 hours. No results for input(s): AMMONIA in the last 168 hours. CBC:  Recent Labs Lab 02/04/16 1335 02/04/16 1701  WBC 5.1 5.5  HGB 13.8 14.2  HCT 39.9 41.7  MCV 94.5 95.6  PLT 242 244   Cardiac Enzymes:  Recent Labs Lab 02/04/16 1701 02/04/16 1940 02/04/16 2203  TROPONINI <0.03 <0.03 <0.03   BNP: Invalid input(s): POCBNP CBG: No results for input(s): GLUCAP in the last 168 hours. D-Dimer No results for input(s): DDIMER in the last 72 hours. Hgb A1c No results for input(s): HGBA1C in the last 72 hours. Lipid Profile  Recent Labs  02/05/16 0413  CHOL 158  HDL 73  LDLCALC 64  TRIG 103  CHOLHDL 2.2   Thyroid function studies  Recent Labs  02/05/16 0413  TSH 1.242   Anemia work up No results for input(s): VITAMINB12, FOLATE, FERRITIN, TIBC, IRON, RETICCTPCT in the last 72 hours. Urinalysis    Component Value Date/Time   COLORURINE YELLOW 03/09/2013 1215   APPEARANCEUR HAZY* 03/09/2013 1215   LABSPEC 1.027 03/09/2013 1215   PHURINE 5.5 03/09/2013 1215   GLUCOSEU NEGATIVE 03/09/2013 1215   HGBUR NEGATIVE 03/09/2013 1215   BILIRUBINUR SMALL* 03/09/2013 1215   KETONESUR 15* 03/09/2013 1215   PROTEINUR NEGATIVE 03/09/2013 1215   UROBILINOGEN 1.0 03/09/2013 1215   NITRITE NEGATIVE 03/09/2013 1215   LEUKOCYTESUR SMALL* 03/09/2013 1215   Sepsis Labs Invalid input(s): PROCALCITONIN,  WBC,   LACTICIDVEN Microbiology No results found for this or any previous visit (from the past 240 hour(s)).   Time coordinating discharge: 27 minutes  SIGNED:   Irwin Brakeman, MD  [facility] 02/05/2016, 1:52 PM Pager   If 7PM-7AM, please contact night-coverage [email or web address] Password [email or web address]

## 2016-02-05 NOTE — Consult Note (Addendum)
CARDIOLOGY CONSULT NOTE   Patient ID: Marilu Wahler MRN: [id number] DOB/AGE: 1946-02-07 70 y.o.  Admit date: 02/04/2016  Primary Physician   [name], MD Primary Cardiologist   Dr. Tamala Julian Reason for Consultation   Chest pain Requesting Physician  Dr. Wynetta Emery  HPI: Linet Iwen is a 70 y.o. female with a history of CAD s/p remote stenting to RCA and PTCA of LAD, recurrent coronary spasms, GERD, HLD, HTN,  Anxiety, ongoing tobacco abuse and recurrent syncope with neurally mediated features who presented with chest pain.   Last echo 02/2013 showed LV EF of 50-55%, grade 2 DD and regional WM abnormality. Follow up last cath in 02/2013 with non-obs CAD (40% proximal LAD an eccentric 50% diagonal #1; patent RCA stent and widely patent circumflex)  and evidence of vasospasm. Patient has had multiple cardiac catheterizations without significant stenosis. Multiple admission for coronary spasms. Last Myoview 01/2004. Not on BB due to bradycardia. Amlodipine 2.5mg  discontinued 10/2014 admission due to syncope related to hypotension. No syncope episode since then.   He continues to have a recurrent episode of diaphoresis and chest pain with near syncope. This episode takes up to one hour to resolve. She needs to lay down to resolve episodes. She usually needs to take sublingual nitroglycerin 3 times in a week. Recently her episode getting more severe and intense. Yesterday she had a worse episode leading to ER presentation for further evaluation. She rated 8 out of 10. She described the chest pain as a "cramping sensation at epigastric/left sided area code. The patient denies lower extremity edema, orthopnea, PND, syncope, melena, blood in her stool or urine.  Troponin x 3 negative. Lytes normal. LDL 64. TSH normal. CXR clear. EKG showed sinus rhythm at rate of 54 bpm, TWI in V5 & V6, prolonged QT which is similar to prior EKG. Her BP was normal upon arrival however become hypotensive later --> given  500CC of fluid. BP relatively stable.    Past Medical History  Diagnosis Date  . Hypertension   . Hypercholesteremia   . GERD (gastroesophageal reflux disease)   . Anxiety   . Hiatal hernia   . Anemia   . Blood transfusion   . Coronary artery disease   . Coronary vasospasm ([place])   . NSVT (nonsustained ventricular tachycardia) ([place]) 09/26/2013  . Coronary artery spasm, wth continued episodes of chest pain.  09/26/2013  . NSTEMI (non-ST elevated myocardial infarction) ([place])   . Panic attack   . Chest pain 02/04/2016     Past Surgical History  Procedure Laterality Date  . Cardiac stent  2012    to RCA  . Shoulder surgery    . Breast      right, tumor   . Knee surgery  left  . Cardiac catheterization  2014  . Cardiac catheterization  2012  . Left heart catheterization with coronary angiogram N/A 02/22/2013    Procedure: LEFT HEART CATHETERIZATION WITH CORONARY ANGIOGRAM;  Surgeon: [name], MD;  Location: [facility] CATH LAB;  Service: Cardiovascular;  Laterality: N/A;  . Abdominal hysterectomy      PARTIAL HYSTERECTOMY    No Known Allergies  I have reviewed the patient's current medications . aspirin EC  81 mg Oral Daily  . buPROPion  150 mg Oral Daily  . calcium-vitamin D  1 tablet Oral BID AC  . citalopram  20 mg Oral Daily  . clonazePAM  0.5 mg Oral Daily  . enoxaparin (LOVENOX) injection  40 mg Subcutaneous Q24H  .  famotidine  20 mg Oral QHS  . ferrous sulfate  325 mg Oral Q breakfast  . isosorbide mononitrate  60 mg Oral Daily  . pantoprazole  40 mg Oral BID  . ramipril  5 mg Oral Daily  . ranolazine  500 mg Oral BID  . simvastatin  20 mg Oral QPM     acetaminophen, gi cocktail, morphine injection, nitroGLYCERIN, ondansetron (ZOFRAN) IV, oxyCODONE, temazepam  Prior to Admission medications   Medication Sig Start Date End Date Taking? Authorizing Provider  aspirin EC 81 MG tablet Take 81 mg by mouth daily.   Yes Historical Provider, MD  buPROPion (ZYBAN) 150  MG 12 hr tablet Take 150 mg by mouth daily. 01/29/16  Yes Historical Provider, MD  calcium-vitamin D (OSCAL WITH D) 500-200 MG-UNIT per tablet Take 1 tablet by mouth 2 (two) times daily.   Yes Historical Provider, MD  citalopram (CELEXA) 20 MG tablet Take 20 mg by mouth daily.   Yes Historical Provider, MD  clonazePAM (KLONOPIN) 0.5 MG tablet Take 0.5 mg by mouth daily as needed for anxiety. For anxiety   Yes Historical Provider, MD  ferrous sulfate 325 (65 FE) MG tablet Take 325 mg by mouth daily with breakfast.   Yes Historical Provider, MD  isosorbide mononitrate (IMDUR) 60 MG 24 hr tablet Take 1 tablet (60 mg total) by mouth daily. 10/23/14  Yes Rhonda G Barrett, PA-C  nitroGLYCERIN (NITROSTAT) 0.4 MG SL tablet Place 1 tablet (0.4 mg total) under the tongue every 5 (five) minutes as needed for chest pain. 10/23/14  Yes Rhonda G Barrett, PA-C  pantoprazole (PROTONIX) 40 MG tablet Take 1 tablet (40 mg total) by mouth 2 (two) times daily. 04/20/14  Yes Isaiah Serge, NP  ramipril (ALTACE) 5 MG capsule Take 5 mg by mouth daily. 12/01/15  Yes Historical Provider, MD  RANEXA 500 MG 12 hr tablet TAKE 1 TABLET(500 MG) BY MOUTH TWICE DAILY 11/22/15  Yes Belva Crome, MD  ranitidine (ZANTAC) 150 MG tablet Take 150 mg by mouth 2 (two) times daily.   Yes Historical Provider, MD  simvastatin (ZOCOR) 20 MG tablet Take 1 tablet (20 mg total) by mouth every evening. 05/04/14  Yes Scott T Weaver, PA-C  temazepam (RESTORIL) 15 MG capsule Take 15 mg by mouth at bedtime as needed for sleep.  02/07/14  Yes Historical Provider, MD  NITROSTAT 0.4 MG SL tablet DISSOLVE 1 TABLET UNDER TONGUE AS NEEDED FOR CHEST PAIN EVERY 5 MINUTES UP TO 3 DOSES 04/02/15   Belva Crome, MD  tiZANidine (ZANAFLEX) 4 MG tablet Take 4 mg by mouth every 6 (six) hours as needed for muscle spasms.  02/07/14   Historical Provider, MD     Social History   Social History  . Marital Status: Married    Spouse Name: [name]  . Number of Children: 0  .  Years of Education: N/A   Occupational History  . Not on file.   Social History Main Topics  . Smoking status: Current Every Day Smoker -- 1.00 packs/day for 40 years    Types: Cigarettes  . Smokeless tobacco: Former Systems developer     Comment: " [place] "  . Alcohol Use: 0.6 oz/week    1 Cans of beer per week     Comment: occasional  . Drug Use: No  . Sexual Activity: No   Other Topics Concern  . Not on file   Social History Narrative   Lives with husband. Ambulates independently.  Family Status  Relation Status Death Age  . Mother Deceased   . Father Deceased   . Maternal Grandmother Deceased   . Maternal Grandfather Deceased   . Paternal Grandmother Deceased   . Paternal Grandfather Deceased    Family History  Problem Relation Age of Onset  . Stroke Sister   . Heart attack    . Heart attack Neg Hx   . Heart failure Mother        ROS:  Full 14 point review of systems complete and found to be negative unless listed above.  Physical Exam: Blood pressure 105/56, pulse 49, temperature 97.5 F (36.4 C), temperature source Oral, resp. rate 18, height 4\' 11"  (1.499 m), weight 122 lb 8 oz (55.566 kg), SpO2 95 %.  General: Well developed, well nourished, female in no acute distress Head: Eyes PERRLA, No xanthomas. Normocephalic and atraumatic, oropharynx without edema or exudate.  Lungs: Resp regular and unlabored, CTA. Heart: RRR no s3, s4, or murmurs..   Neck: No carotid bruits. No lymphadenopathy.  No JVD. Abdomen: Bowel sounds present, abdomen soft and non-tender without masses or hernias noted. Msk:  No spine or cva tenderness. No weakness, no joint deformities or effusions. Extremities: No clubbing, cyanosis or edema. DP/PT/Radials 2+ and equal bilaterally. Neuro: Alert and oriented X 3. No focal deficits noted. Psych:  Good affect, responds appropriately Skin: No rashes or lesions noted.  Labs:   Lab Results  Component Value Date   WBC 5.5 02/04/2016    HGB 14.2 02/04/2016   HCT 41.7 02/04/2016   MCV 95.6 02/04/2016   PLT 244 02/04/2016   No results for input(s): INR in the last 72 hours.  Recent Labs Lab 02/04/16 1335 02/04/16 1701 02/04/16 1940  NA 139  --   --   K 4.0  --   --   CL 105  --   --   CO2 25  --   --   BUN 9  --   --   CREATININE 0.85 0.89  --   CALCIUM 9.5  --   --   PROT  --   --  5.9*  BILITOT  --   --  0.4  ALKPHOS  --   --  77  ALT  --   --  14  AST  --   --  17  GLUCOSE 112*  --   --   ALBUMIN  --   --  3.2*    Recent Labs  02/04/16 1701 02/04/16 1940 02/04/16 2203  TROPONINI <0.03 <0.03 <0.03    Recent Labs  02/04/16 1350  TROPIPOC 0.00   PRO B NATRIURETIC PEPTIDE (BNP)  Date/Time Value Ref Range Status  09/21/2013 07:20 PM 99.1 0 - 125 pg/mL Final  02/21/2013 07:50 PM 52.7 0 - 125 pg/mL Final   Lab Results  Component Value Date   CHOL 158 02/05/2016   HDL 73 02/05/2016   LDLCALC 64 02/05/2016   TRIG 103 02/05/2016   Echo: 02/22/13 LV EF: 50% -  55%  ------------------------------------------------------------ Indications:   Chest pain 786.51.  ------------------------------------------------------------ History:  Risk factors: Former tobacco use. Hypertension. Dyslipidemia.  ------------------------------------------------------------ Study Conclusions  - Left ventricle: The cavity size was normal. Systolic function was normal. The estimated ejection fraction was in the range of 50% to 55%. There is moderate hypokinesis of the entire inferoposterior myocardium. There was an increased relative contribution of atrial contraction to ventricular filling. Features are consistent with a pseudonormal left ventricular filling  pattern, with concomitant abnormal relaxation and increased filling pressure (grade 2 diastolic dysfunction). - Mitral valve: Mild regurgitation.   Cath 02/22/2013 ANGIOGRAPHIC DATA: The left main coronary artery is widely patent.  Calcification is noted.  The left anterior descending artery is 40% proximal stenosis at the first diagonal. The first diagonal contains an eccentric 50% ostial narrowing.  The left circumflex artery is widely patent with a dominant obtuse marginal branch it increases significantly in size after intracoronary nitroglycerin..  The right coronary artery is dominant with no significant obstruction. Widely patent mid vessel stent .  LEFT VENTRICULOGRAM: Left ventricular angiogram was done in the 30 RAO projection and revealed normal left ventricular wall motion and systolic function with an estimated ejection fraction of 50 %. LVEDP was 13 mmHg.  IMPRESSIONS: 1. Generalized left coronary vasoconstriction relieved with intracoronary nitroglycerin  2. 40% proximal LAD an eccentric 50% diagonal #1.  3. Widely patent circumflex and RCA including the RCA stent  4. Low normal LV systolic function   RECOMMENDATION: Discontinue IV nitroglycerin. Continue antiplatelet therapy. Ambulate later today. Consider discharge later this evening or in a.m..   ECG: EKG showed sinus rhythm at rate of 54 bpm, TWI in V5 & V6, prolonged QT which is similar to prior EKG.   Vent. rate 54 BPM PR interval * ms QRS duration 87 ms QT/QTc 518/491 ms P-R-T axes 47 -14 50    Radiology:  Dg Chest 2 View  02/04/2016  CLINICAL DATA:  Chest pain EXAM: CHEST  2 VIEW COMPARISON:  06/29/2015 FINDINGS: Normal heart size and mediastinal contours. No acute infiltrate or edema. No effusion or pneumothorax. Lower cervical ACDF. No acute osseous findings. IMPRESSION: No active cardiopulmonary disease. Electronically Signed   By: Monte Fantasia M.D.   On: 02/04/2016 14:02    ASSESSMENT AND PLAN:     1. Chest pain - Seems typical coronary spasm episode, however, this is the worst episodes she had recently. Troponin 3 negative. EKG without acute changes. She is not on optimal therapy for coronary vasospasm due to  hypotension. ? Stress test. Inpatient vs outpatient. Will review with MD.  - Continue an aspirin 81 mg, statin, Imdur and Ranexa.  2. Coronary artery disease status post RCA stent and PTCA of LAD. - Last echo 02/2013 showed LV EF of 50-55%, grade 2 DD and regional WM abnormality. Follow up last cath in 02/2013 with non-obs CAD (40% proximal LAD an eccentric 50% diagonal #1; patent RCA stent and widely patent circumflex)  and evidence of vasospasm. Patient has had multiple cardiac catheterizations without significant stenosis.  3. Ongoing tobacco abuse - She has quit before but relapsed. Currently smoking one fourth of pack per day. Trying to quit.  4. Hypertension - On Imdur 60 mg. Ramipril 5 mg discontinued due to hypotension.   5. HLD - Continue statin.    SignedLeanor Kail, [place] 02/05/2016, 7:41 AM Pager [phone]  Co-Sign MD  Patient seen and examined. Agree with assessment and plan.  Ms. Aspynn Waara is a 70 year old African-American female who has known CAD and is status post remote stenting to her RCA and PTCA of her LAD.  She has had issues with recurrent vasospasm leading to recurrent cardiac catheterizations without significant restenosis.  She has a history of GERD, hyperlipidemia, hypertension, anxiety, and she continues to smoke cigarettes and currently smoking close to one half pack per day.  Her last cardiac catheterization was in 2014 which showed 40% proximal LAD narrowing and 50% eccentric stenosis in the  first diagonal vessel.  Her RCA and circumflex were normal.  She had evidence for vasospasm.  Remotely, she has had issues with hypotension and hypotension mediated syncope.  She had been on low-dose amlodipine , but this was discontinued.  She also had been on ACE inhibition with ramipril 5 mg.  Yesterday, she had a recurrent episode of chest cramping which she described as a knot-like sensation in her chest.  She had associated diaphoresis.  She has taken  nitroglycerin for this in the past.  She was admitted overnight by the hospitalist.  Her troponins are negative.  Her ECG reveals sinus bradycardia 54 bpm.  There are nondiagnostic T-wave changes in leads V5 and V6.  She had mild QTC prolongation at 491 ms.  Her blood pressure has been low.  She is on numerous medications including Wellbutrin SR, Celexa, and Klonopin.  Her cardiac medicines include isosorbide mononitrate at 60 mg in addition to ranolazine 500 mg twice a day and statin therapy with simvastatin 20 mg daily.  If possible, it may be beneficial to try to reduce the psychiatric medication.  She would benefit from potential reinstitution of very low-dose ACE inhibitor with ramipril 2.5 mg at bedtime, which may help with nitric oxide mediated vasodilation.  Continue statin therapy with LDL target less than 70.  I had a long discussion with her concerning the absolute importance of complete discontinuance of tobacco which contains nicotine which is a vasoconstrictive agent.  The patient can be discharged later today.  However, I would recommend a follow-up [place] study as an outpatient to further evaluate her CAD and make certain she she does not have any ischemia.   Troy Sine, MD, [facility] 02/05/2016 10:06 AM

## 2016-02-05 NOTE — Care Management Obs Status (Signed)
[place] NOTIFICATION   Patient Details  Name: [name] MRN: [id number] Date of Birth: 04-Oct-1945   Medicare Observation Status Notification Given:  Yes (Chest Pain)    Bethena Roys, RN 02/05/2016, 10:29 AM

## 2016-02-07 NOTE — ED Provider Notes (Signed)
CSN: [id number]     Arrival date & time 02/04/16  1323 History   First MD Initiated Contact with Patient 02/04/16 1330     Chief Complaint  Patient presents with  . Chest Pain     (Consider location/radiation/quality/duration/timing/severity/associated sxs/prior Treatment) HPI Comments: 70 yo female with CAD hx presents with central chest pain with right arm radiation that started this morning.  Mild exertional component and diaphoresis.  Also worse with palpation.  No sob or syncope.  Pt had similar months in the past. Unsure detailed cardiac hx.  CP mild currently. Nothing improves.   Patient is a 70 y.o. female presenting with chest pain. The history is provided by the patient.  Chest Pain Associated symptoms: diaphoresis   Associated symptoms: no abdominal pain, no back pain, no fever, no headache, no shortness of breath and not vomiting     Past Medical History  Diagnosis Date  . Hypertension   . Hypercholesteremia   . GERD (gastroesophageal reflux disease)   . Anxiety   . Hiatal hernia   . Anemia   . Blood transfusion   . Coronary artery disease   . Coronary vasospasm ([place])   . NSVT (nonsustained ventricular tachycardia) ([place]) 09/26/2013  . Coronary artery spasm, wth continued episodes of chest pain.  09/26/2013  . NSTEMI (non-ST elevated myocardial infarction) ([place])   . Panic attack   . Chest pain 02/04/2016   Past Surgical History  Procedure Laterality Date  . Cardiac stent  2012    to RCA  . Shoulder surgery    . Breast      right, tumor   . Knee surgery  left  . Cardiac catheterization  2014  . Cardiac catheterization  2012  . Left heart catheterization with coronary angiogram N/A 02/22/2013    Procedure: LEFT HEART CATHETERIZATION WITH CORONARY ANGIOGRAM;  Surgeon: [name], MD;  Location: [facility] CATH LAB;  Service: Cardiovascular;  Laterality: N/A;  . Abdominal hysterectomy      PARTIAL HYSTERECTOMY   Family History  Problem Relation Age of Onset  .  Stroke Sister   . Heart attack    . Heart attack Neg Hx   . Heart failure Mother    Social History  Substance Use Topics  . Smoking status: Current Every Day Smoker -- 1.00 packs/day for 40 years    Types: Cigarettes  . Smokeless tobacco: Former Systems developer     Comment: " [place] "  . Alcohol Use: 0.6 oz/week    1 Cans of beer per week     Comment: occasional   OB History    No data available     Review of Systems  Constitutional: Positive for diaphoresis and appetite change. Negative for fever and chills.  HENT: Negative for congestion.   Eyes: Negative for visual disturbance.  Respiratory: Negative for shortness of breath.   Cardiovascular: Positive for chest pain.  Gastrointestinal: Negative for vomiting and abdominal pain.  Genitourinary: Negative for dysuria and flank pain.  Musculoskeletal: Negative for back pain, neck pain and neck stiffness.  Skin: Negative for rash.  Neurological: Positive for light-headedness. Negative for headaches.      Allergies  Review of patient's allergies indicates no known allergies.  Home Medications   Prior to Admission medications   Medication Sig Start Date End Date Taking? Authorizing Provider  aspirin EC 81 MG tablet Take 81 mg by mouth daily.   Yes Historical Provider, MD  buPROPion (ZYBAN) 150 MG 12  hr tablet Take 150 mg by mouth daily. 01/29/16  Yes Historical Provider, MD  calcium-vitamin D (OSCAL WITH D) 500-200 MG-UNIT per tablet Take 1 tablet by mouth 2 (two) times daily.   Yes Historical Provider, MD  citalopram (CELEXA) 20 MG tablet Take 20 mg by mouth daily.   Yes Historical Provider, MD  clonazePAM (KLONOPIN) 0.5 MG tablet Take 0.5 mg by mouth daily as needed for anxiety. For anxiety   Yes Historical Provider, MD  ferrous sulfate 325 (65 FE) MG tablet Take 325 mg by mouth daily with breakfast.   Yes Historical Provider, MD  isosorbide mononitrate (IMDUR) 60 MG 24 hr tablet Take 1 tablet (60 mg total) by mouth daily.  10/23/14  Yes Rhonda G Barrett, PA-C  nitroGLYCERIN (NITROSTAT) 0.4 MG SL tablet Place 1 tablet (0.4 mg total) under the tongue every 5 (five) minutes as needed for chest pain. 10/23/14  Yes Rhonda G Barrett, PA-C  pantoprazole (PROTONIX) 40 MG tablet Take 1 tablet (40 mg total) by mouth 2 (two) times daily. 04/20/14  Yes Isaiah Serge, NP  RANEXA 500 MG 12 hr tablet TAKE 1 TABLET(500 MG) BY MOUTH TWICE DAILY 11/22/15  Yes Belva Crome, MD  ranitidine (ZANTAC) 150 MG tablet Take 150 mg by mouth 2 (two) times daily.   Yes Historical Provider, MD  simvastatin (ZOCOR) 20 MG tablet Take 1 tablet (20 mg total) by mouth every evening. 05/04/14  Yes Scott T Weaver, PA-C  temazepam (RESTORIL) 15 MG capsule Take 15 mg by mouth at bedtime as needed for sleep.  02/07/14  Yes Historical Provider, MD  ramipril (ALTACE) 2.5 MG capsule Take 2 capsules (5 mg total) by mouth at bedtime. 02/05/16   Clanford Marisa Hua, MD  tiZANidine (ZANAFLEX) 4 MG tablet Take 4 mg by mouth every 6 (six) hours as needed for muscle spasms.  02/07/14   Historical Provider, MD   BP 117/54 mmHg  Pulse 60  Temp(Src) 98.2 F (36.8 C) (Oral)  Resp 18  Ht 4\' 11"  (1.499 m)  Wt 122 lb 8 oz (55.566 kg)  BMI 24.73 kg/m2  SpO2 93% Physical Exam  Constitutional: She is oriented to person, place, and time. She appears well-developed and well-nourished.  HENT:  Head: Normocephalic and atraumatic.  Eyes: Conjunctivae are normal. Right eye exhibits no discharge. Left eye exhibits no discharge.  Neck: Normal range of motion. Neck supple. No tracheal deviation present.  Cardiovascular: Normal rate, regular rhythm and normal heart sounds.   Pulmonary/Chest: Effort normal and breath sounds normal.  Abdominal: Soft. She exhibits no distension. There is no tenderness. There is no guarding.  Musculoskeletal: She exhibits no edema.  Neurological: She is alert and oriented to person, place, and time.  Skin: Skin is warm. No rash noted.  Psychiatric:  Her mood appears anxious.  Nursing note and vitals reviewed.   ED Course  Procedures (including critical care time) Labs Review Labs Reviewed  BASIC METABOLIC PANEL - Abnormal; Notable for the following:    Glucose, Bld 112 (*)    All other components within normal limits  HEPATIC FUNCTION PANEL - Abnormal; Notable for the following:    Total Protein 5.9 (*)    Albumin 3.2 (*)    Bilirubin, Direct <0.1 (*)    All other components within normal limits  CBC  TROPONIN I  TROPONIN I  TROPONIN I  CBC  CREATININE, SERUM  LIPID PANEL  TSH  I-STAT TROPOININ, ED    Imaging Review No results  found. I have personally reviewed and evaluated these images and lab results as part of my medical decision-making.   EKG Interpretation   Date/Time:  Monday February 04 2016 13:27:12 EDT Ventricular Rate:  54 PR Interval:    QRS Duration: 87 QT Interval:  518 QTC Calculation: 491 R Axis:   -14 Text Interpretation:  Sinus rhythm Low voltage, precordial leads  Borderline prolonged QT interval Baseline wander in lead(s) V6 Overall  similar to previous Confirmed by Pernell Dikes MD, Grecia Lynk [phone]) on 02/04/2016  2:31:14 PM      MDM   Final diagnoses:  Essential hypertension  Gastroesophageal reflux disease without esophagitis    Pt with CAD hx presents with worsening CP.  WIth risk factors/ CAD plan for cardiac rule out.  Troponin neg in ED.  EKG similar to previous.   The patients results and plan were reviewed and discussed.   Any x-rays performed were independently reviewed by myself.   Differential diagnosis were considered with the presenting HPI.  Medications  ketorolac (TORADOL) 30 MG/ML injection 30 mg (30 mg Intravenous Given 02/04/16 1619)  sodium chloride 0.9 % bolus 500 mL (500 mLs Intravenous Given 02/05/16 0001)    Filed Vitals:   02/04/16 2337 02/05/16 0035 02/05/16 0500 02/05/16 1137  BP: 79/44 90/55 105/56 117/54  Pulse: 60  49 60  Temp: 97.9 F (36.6 C)  97.5 F  (36.4 C) 98.2 F (36.8 C)  TempSrc: Oral  Oral Oral  Resp:      Height:      Weight:   122 lb 8 oz (55.566 kg)   SpO2: 93%  95% 93%    Final diagnoses:  Essential hypertension  Gastroesophageal reflux disease without esophagitis    Admission/ observation were discussed with the admitting physician, patient and/or family and they are comfortable with the plan.      Elnora Morrison, MD 02/07/16 [phone]

## 2016-02-11 ENCOUNTER — Ambulatory Visit (HOSPITAL_COMMUNITY): Payer: Medicare Other | Attending: Cardiovascular Disease

## 2016-02-11 ENCOUNTER — Other Ambulatory Visit: Payer: Self-pay | Admitting: Interventional Cardiology

## 2016-02-11 DIAGNOSIS — I2510 Atherosclerotic heart disease of native coronary artery without angina pectoris: Secondary | ICD-10-CM

## 2016-02-11 DIAGNOSIS — R079 Chest pain, unspecified: Secondary | ICD-10-CM

## 2016-02-11 DIAGNOSIS — R9439 Abnormal result of other cardiovascular function study: Secondary | ICD-10-CM | POA: Insufficient documentation

## 2016-02-11 DIAGNOSIS — I517 Cardiomegaly: Secondary | ICD-10-CM | POA: Diagnosis not present

## 2016-02-11 LAB — MYOCARDIAL PERFUSION IMAGING
LV dias vol: 97 mL (ref 46–106)
LV sys vol: 61 mL
Peak HR: 97 {beats}/min
RATE: 0.25
Rest HR: 60 {beats}/min
SDS: 4
SRS: 14
SSS: 18
TID: 1.08

## 2016-02-11 MED ORDER — REGADENOSON 0.4 MG/5ML IV SOLN
0.4000 mg | Freq: Once | INTRAVENOUS | Status: AC
  Administered 2016-02-11: 0.4 mg via INTRAVENOUS

## 2016-02-11 MED ORDER — TECHNETIUM TC 99M TETROFOSMIN IV KIT
10.5000 | PACK | Freq: Once | INTRAVENOUS | Status: AC | PRN
  Administered 2016-02-11: 11 via INTRAVENOUS
  Filled 2016-02-11: qty 11

## 2016-02-11 MED ORDER — TECHNETIUM TC 99M TETROFOSMIN IV KIT
32.5000 | PACK | Freq: Once | INTRAVENOUS | Status: AC | PRN
  Administered 2016-02-11: 33 via INTRAVENOUS
  Filled 2016-02-11: qty 33

## 2016-04-16 ENCOUNTER — Other Ambulatory Visit: Payer: Self-pay | Admitting: Interventional Cardiology

## 2016-05-01 ENCOUNTER — Other Ambulatory Visit: Payer: Self-pay | Admitting: Internal Medicine

## 2016-05-01 ENCOUNTER — Ambulatory Visit
Admission: RE | Admit: 2016-05-01 | Discharge: 2016-05-01 | Disposition: A | Discharge status: Home / Self Care | Payer: Medicare Other | Source: Ambulatory Visit | Attending: Internal Medicine | Admitting: Internal Medicine

## 2016-05-01 DIAGNOSIS — M79645 Pain in left finger(s): Secondary | ICD-10-CM

## 2016-05-01 DIAGNOSIS — R109 Unspecified abdominal pain: Secondary | ICD-10-CM

## 2016-05-02 ENCOUNTER — Other Ambulatory Visit: Payer: Self-pay | Admitting: Internal Medicine

## 2016-05-02 DIAGNOSIS — I2510 Atherosclerotic heart disease of native coronary artery without angina pectoris: Secondary | ICD-10-CM

## 2016-05-02 DIAGNOSIS — I379 Nonrheumatic pulmonary valve disorder, unspecified: Secondary | ICD-10-CM

## 2016-05-07 ENCOUNTER — Ambulatory Visit
Admission: RE | Admit: 2016-05-07 | Discharge: 2016-05-07 | Disposition: A | Discharge status: Home / Self Care | Payer: Medicare Other | Source: Ambulatory Visit | Attending: Internal Medicine | Admitting: Internal Medicine

## 2016-05-07 DIAGNOSIS — I379 Nonrheumatic pulmonary valve disorder, unspecified: Secondary | ICD-10-CM

## 2016-05-07 DIAGNOSIS — I2510 Atherosclerotic heart disease of native coronary artery without angina pectoris: Secondary | ICD-10-CM

## 2016-05-07 MED ORDER — IOPAMIDOL (ISOVUE-370) INJECTION 76%
80.0000 mL | Freq: Once | INTRAVENOUS | Status: AC | PRN
  Administered 2016-05-07: 80 mL via INTRAVENOUS

## 2016-05-29 ENCOUNTER — Other Ambulatory Visit (HOSPITAL_COMMUNITY): Payer: Self-pay | Admitting: Gastroenterology

## 2016-05-29 DIAGNOSIS — R1013 Epigastric pain: Secondary | ICD-10-CM

## 2016-06-06 ENCOUNTER — Other Ambulatory Visit (HOSPITAL_COMMUNITY): Payer: Self-pay | Admitting: Gastroenterology

## 2016-06-06 ENCOUNTER — Ambulatory Visit (HOSPITAL_COMMUNITY)
Admission: RE | Admit: 2016-06-06 | Discharge: 2016-06-06 | Disposition: A | Discharge status: Home / Self Care | Payer: Medicare Other | Source: Ambulatory Visit | Attending: Gastroenterology | Admitting: Gastroenterology

## 2016-06-06 DIAGNOSIS — K224 Dyskinesia of esophagus: Secondary | ICD-10-CM | POA: Insufficient documentation

## 2016-06-06 DIAGNOSIS — R1013 Epigastric pain: Secondary | ICD-10-CM

## 2016-06-10 ENCOUNTER — Other Ambulatory Visit: Payer: Self-pay | Admitting: Interventional Cardiology

## 2016-07-10 ENCOUNTER — Ambulatory Visit (INDEPENDENT_AMBULATORY_CARE_PROVIDER_SITE_OTHER): Payer: Medicare Other | Admitting: Interventional Cardiology

## 2016-07-10 ENCOUNTER — Encounter: Payer: Self-pay | Admitting: Interventional Cardiology

## 2016-07-10 VITALS — BP 124/72 | HR 77 | Ht 64.0 in | Wt 131.1 lb

## 2016-07-10 DIAGNOSIS — R55 Syncope and collapse: Secondary | ICD-10-CM

## 2016-07-10 DIAGNOSIS — I10 Essential (primary) hypertension: Secondary | ICD-10-CM

## 2016-07-10 DIAGNOSIS — I201 Angina pectoris with documented spasm: Secondary | ICD-10-CM

## 2016-07-10 DIAGNOSIS — I2510 Atherosclerotic heart disease of native coronary artery without angina pectoris: Secondary | ICD-10-CM

## 2016-07-10 DIAGNOSIS — Z01812 Encounter for preprocedural laboratory examination: Secondary | ICD-10-CM

## 2016-07-10 NOTE — Progress Notes (Signed)
Cardiology Office Note    Date:  07/10/2016   ID:  Mieisha Stott, DOB 14-Jul-1946, MRN [id number]  PCP:  [name], MD  Cardiologist: [name], MD   Chief Complaint  Patient presents with  . Coronary Artery Disease    History of Present Illness:  [name] is a 70 y.o. female who presents for obstructive coronary disease with prior stenting, well documented recurrent coronary spasm, recurrent syncope with neurally mediated features, hypertension, hyperlipidemia, tobacco abuse, and gastroesophageal reflux.  Significant progression of limitations from chest discomfort. Episodes occur at rest, usually early in the morning after awakening. She now has predictable exertional chest discomfort that is relieved by rest and sublingual nitroglycerin. Last coronary arteriogram was performed in 2014. She had moderate three-vessel coronary obstruction. She has a history of prior stenting greater than 10 years ago.   Past Medical History:  Diagnosis Date  . Anemia   . Anxiety   . Blood transfusion   . Chest pain 02/04/2016  . Coronary artery disease   . Coronary artery spasm, wth continued episodes of chest pain.  09/26/2013  . Coronary vasospasm ([place])   . GERD (gastroesophageal reflux disease)   . Hiatal hernia   . Hypercholesteremia   . Hypertension   . NSTEMI (non-ST elevated myocardial infarction) ([place])   . NSVT (nonsustained ventricular tachycardia) ([place]) 09/26/2013  . Panic attack     Past Surgical History:  Procedure Laterality Date  . ABDOMINAL HYSTERECTOMY     PARTIAL HYSTERECTOMY  . breast     right, tumor   . CARDIAC CATHETERIZATION  2014  . CARDIAC CATHETERIZATION  2012  . cardiac stent  2012   to RCA  . KNEE SURGERY  left  . LEFT HEART CATHETERIZATION WITH CORONARY ANGIOGRAM N/A 02/22/2013   Procedure: LEFT HEART CATHETERIZATION WITH CORONARY ANGIOGRAM;  Surgeon: [name], MD;  Location: [facility] CATH LAB;  Service: Cardiovascular;  Laterality:  N/A;  . SHOULDER SURGERY      Current Medications: Outpatient Medications Prior to Visit  Medication Sig Dispense Refill  . aspirin EC 81 MG tablet Take 81 mg by mouth daily.    Marland Kitchen buPROPion (ZYBAN) 150 MG 12 hr tablet Take 150 mg by mouth daily.  11  . calcium-vitamin D (OSCAL WITH D) 500-200 MG-UNIT per tablet Take 1 tablet by mouth 2 (two) times daily.    . citalopram (CELEXA) 20 MG tablet Take 20 mg by mouth daily.    . clonazePAM (KLONOPIN) 0.5 MG tablet Take 0.5 mg by mouth daily as needed for anxiety. For anxiety    . ferrous sulfate 325 (65 FE) MG tablet Take 325 mg by mouth daily with breakfast.    . isosorbide mononitrate (IMDUR) 60 MG 24 hr tablet Take 1 tablet (60 mg total) by mouth daily. 30 tablet 11  . nitroGLYCERIN (NITROSTAT) 0.4 MG SL tablet ONE TABLET UNDER TONGUE AS NEEDED FOR CHEST PAIN EVERY 5 MINUTES UP TO 3 DOSES, THEN SEEK MEDICAL ATTENTION 25 tablet 0  . pantoprazole (PROTONIX) 40 MG tablet Take 1 tablet (40 mg total) by mouth 2 (two) times daily. 60 tablet 2  . ramipril (ALTACE) 2.5 MG capsule Take 2 capsules (5 mg total) by mouth at bedtime. 30 capsule 0  . RANEXA 500 MG 12 hr tablet TAKE 1 TABLET(500 MG) BY MOUTH TWICE DAILY 60 tablet 11  . ranitidine (ZANTAC) 150 MG tablet Take 150 mg by mouth 2 (two) times daily.    . simvastatin (  ZOCOR) 20 MG tablet Take 1 tablet (20 mg total) by mouth every evening. 30 tablet 11  . temazepam (RESTORIL) 15 MG capsule Take 15 mg by mouth at bedtime as needed for sleep.     Marland Kitchen tiZANidine (ZANAFLEX) 4 MG tablet Take 4 mg by mouth every 6 (six) hours as needed for muscle spasms.      No facility-administered medications prior to visit.      Allergies:   Patient has no known allergies.   Social History   Social History  . Marital status: Married    Spouse name: [name]  . Number of children: 0  . Years of education: N/A   Social History Main Topics  . Smoking status: Former Smoker    Packs/day: 1.00    Years: 40.00     Types: Cigarettes    Quit date: 01/10/2016  . Smokeless tobacco: Former Systems developer     Comment: " [place] "  . Alcohol use 0.6 oz/week    1 Cans of beer per week     Comment: occasional  . Drug use: No  . Sexual activity: No   Other Topics Concern  . None   Social History Narrative   Lives with husband. Ambulates independently.     Family History:  The patient's family history includes Heart failure in her mother; Stroke in her sister.   ROS:   Please see the history of present illness.    Depression, back discomfort, rash, dizziness, recurrent syncope, headaches, anxiety, constipation, snoring, leg pain, hearing loss, and chest pain as noted above.  All other systems reviewed and are negative.   PHYSICAL EXAM:   VS:  BP 124/72 (BP Location: Right Arm)   Pulse 77   Ht 5\' 4"  (1.626 m)   Wt 131 lb 1.9 oz (59.5 kg)   BMI 22.51 kg/m    GEN: Well nourished, well developed, in no acute distress  HEENT: normal  Neck: no JVD, carotid bruits, or masses Cardiac: RRR; no murmurs, rubs, or gallops,no edema  Respiratory:  clear to auscultation bilaterally, normal work of breathing GI: soft, nontender, nondistended, + BS MS: no deformity or atrophy  Skin: warm and dry, no rash Neuro:  Alert and Oriented x 3, Strength and sensation are intact Psych: euthymic mood, full affect  Wt Readings from Last 3 Encounters:  07/10/16 131 lb 1.9 oz (59.5 kg)  02/05/16 122 lb 8 oz (55.6 kg)  06/29/15 119 lb 3.2 oz (54.1 kg)      Studies/Labs Reviewed:   EKG:  EKG Is not repeated.  Recent Labs: 02/04/2016: ALT 14; BUN 9; Creatinine, Ser 0.89; Hemoglobin 14.2; Platelets 244; Potassium 4.0; Sodium 139 02/05/2016: TSH 1.242   Lipid Panel    Component Value Date/Time   CHOL 158 02/05/2016 0413   TRIG 103 02/05/2016 0413   HDL 73 02/05/2016 0413   CHOLHDL 2.2 02/05/2016 0413   VLDL 21 02/05/2016 0413   LDLCALC 64 02/05/2016 0413    Additional studies/ records that were reviewed today  include:  Reviewed last cardiac catheterization. Reviewed the nuclear study from 02/2016:  Study Highlights    Nuclear stress EF: 37%.  The left ventricular ejection fraction is moderately decreased (30-44%).  There was no ST segment deviation noted during stress.  Defect 1: There is a large defect of severe severity present in the basal anterolateral and mid anterolateral location.  Findings consistent with prior myocardial infarction.  This is an intermediate risk study.   Intermediate  risk stress nuclear study with large prior lateral infarct; no significant ischemia; EF 37 with global hypokinesis and akinesis of the lateral wall; mild LVE; interpreted as intermediate risk due to reduced LV function.        ASSESSMENT:    1. Coronary artery spasm, wth continued episodes of chest pain.    2. CAD in native artery   3. Essential hypertension   4. Syncope, unspecified syncope type   5. Pre-procedure lab exam      PLAN:  In order of problems listed above:  1. She is having progressive angina with symptoms compatible with exertional angina which would imply fixed disease rather than vasospastic disease as she has been proven to have previously. I have recommended that she undergo diagnostic coronary angiography with possible PCI if indicated. 2. Controlled. 3. Probably neurally mediated.  The patient was counseled to undergo left heart catheterization, coronary angiography, and possible percutaneous coronary intervention with stent implantation. The procedural risks and benefits were discussed in detail. The risks discussed included death, stroke, myocardial infarction, life-threatening bleeding, limb ischemia, kidney injury, allergy, and possible emergency cardiac surgery. The risk of these significant complications were estimated to occur less than 1% of the time. After discussion, the patient has agreed to proceed.    Medication Adjustments/Labs and Tests  Ordered: Current medicines are reviewed at length with the patient today.  Concerns regarding medicines are outlined above.  Medication changes, Labs and Tests ordered today are listed in the Patient Instructions below. Patient Instructions  Medication Instructions:  None  Labwork: CBC, INR and BMET prior to catheterization.  Testing/Procedures: Your physician has requested that you have a cardiac catheterization. Cardiac catheterization is used to diagnose and/or treat various heart conditions. Doctors may recommend this procedure for a number of different reasons. The most common reason is to evaluate chest pain. Chest pain can be a symptom of coronary artery disease (CAD), and cardiac catheterization can show whether plaque is narrowing or blocking your heart's arteries. This procedure is also used to evaluate the valves, as well as measure the blood flow and oxygen levels in different parts of your heart. For further information please visit HugeFiesta.[email or web address]. Please follow instruction sheet, as given.   Follow-Up: To be determined after heart catheterization.   Any Other Special Instructions Will Be Listed Below (If Applicable).  Please call Anderson Malta at [phone] and let me know which date you prefer to have your cath done: December 6, 7, 11, 12 or 15.  Please let me know two dates that work for you.    If you need a refill on your cardiac medications before your next appointment, please call your pharmacy.      Signed, [name], MD  07/10/2016 12:29 PM    [facility] [address], [place], [place]  [address] Phone: [phone]; Fax: [phone]

## 2016-07-10 NOTE — Patient Instructions (Addendum)
Medication Instructions:  None  Labwork: CBC, INR and BMET prior to catheterization.  Testing/Procedures: Your physician has requested that you have a cardiac catheterization. Cardiac catheterization is used to diagnose and/or treat various heart conditions. Doctors may recommend this procedure for a number of different reasons. The most common reason is to evaluate chest pain. Chest pain can be a symptom of coronary artery disease (CAD), and cardiac catheterization can show whether plaque is narrowing or blocking your heart's arteries. This procedure is also used to evaluate the valves, as well as measure the blood flow and oxygen levels in different parts of your heart. For further information please visit HugeFiesta.[email or web address]. Please follow instruction sheet, as given.   Follow-Up: To be determined after heart catheterization.   Any Other Special Instructions Will Be Listed Below (If Applicable).  Please call Anderson Malta at [phone] and let me know which date you prefer to have your cath done: December 6, 7, 11, 12 or 15.  Please let me know two dates that work for you.    If you need a refill on your cardiac medications before your next appointment, please call your pharmacy.

## 2016-07-14 ENCOUNTER — Telehealth: Payer: Self-pay | Admitting: Interventional Cardiology

## 2016-07-14 ENCOUNTER — Encounter: Payer: Self-pay | Admitting: *Deleted

## 2016-07-14 NOTE — Telephone Encounter (Signed)
Spoke with pt and she states 12/12 is the preferred date.  Advised I will call and get everything set up and then call back with instructions.  Labs set up for 07/16/16.

## 2016-07-14 NOTE — Telephone Encounter (Signed)
New message   Pt verbalized that she wants a call back from rn about the date 11/ 12/15 of december

## 2016-07-14 NOTE — Telephone Encounter (Signed)
Spoke with pt and went over cath instructions.  Moved lab appt to 12/7 as pt requested.  Advised pt to also pick up instruction letter when here for lab draw.  Pt verbalized understanding and was in agreement with this plan.

## 2016-07-16 ENCOUNTER — Other Ambulatory Visit: Payer: Medicare Other

## 2016-07-17 ENCOUNTER — Telehealth: Payer: Self-pay | Admitting: Interventional Cardiology

## 2016-07-17 ENCOUNTER — Other Ambulatory Visit: Payer: Medicare Other | Admitting: *Deleted

## 2016-07-17 DIAGNOSIS — Z01812 Encounter for preprocedural laboratory examination: Secondary | ICD-10-CM

## 2016-07-17 LAB — BASIC METABOLIC PANEL
BUN: 11 mg/dL (ref 7–25)
CO2: 28 mmol/L (ref 20–31)
Calcium: 9.2 mg/dL (ref 8.6–10.4)
Chloride: 106 mmol/L (ref 98–110)
Creat: 0.77 mg/dL (ref 0.60–0.93)
Glucose, Bld: 93 mg/dL (ref 65–99)
Potassium: 4.2 mmol/L (ref 3.5–5.3)
Sodium: 142 mmol/L (ref 135–146)

## 2016-07-17 LAB — CBC
HCT: 41 % (ref 35.0–45.0)
Hemoglobin: 13.2 g/dL (ref 11.7–15.5)
MCH: 30.8 pg (ref 27.0–33.0)
MCHC: 32.2 g/dL (ref 32.0–36.0)
MCV: 95.6 fL (ref 80.0–100.0)
MPV: 9.7 fL (ref 7.5–12.5)
Platelets: 289 10*3/uL (ref 140–400)
RBC: 4.29 MIL/uL (ref 3.80–5.10)
RDW: 13.8 % (ref 11.0–15.0)
WBC: 5.5 10*3/uL (ref 3.8–10.8)

## 2016-07-17 LAB — PROTIME-INR
INR: 1
Prothrombin Time: 10.4 s (ref 9.0–11.5)

## 2016-07-17 NOTE — Telephone Encounter (Signed)
Spoke with pt and she wanted to know if she needed to have her acrylic nails removed prior to her heart cath.  Advised pt that she did not need to do that.  Pt appreciative for assistance.

## 2016-07-17 NOTE — Telephone Encounter (Signed)
New Message  Pt voiced wanting to speak with nurse and would like nurse to return her call.  Please f/u with pt

## 2016-07-19 ENCOUNTER — Other Ambulatory Visit: Payer: Self-pay | Admitting: Interventional Cardiology

## 2016-07-19 DIAGNOSIS — I209 Angina pectoris, unspecified: Secondary | ICD-10-CM

## 2016-07-19 DIAGNOSIS — I208 Other forms of angina pectoris: Secondary | ICD-10-CM

## 2016-07-22 ENCOUNTER — Encounter (HOSPITAL_COMMUNITY): Payer: Self-pay | Admitting: Interventional Cardiology

## 2016-07-22 ENCOUNTER — Encounter (HOSPITAL_COMMUNITY)
Admission: RE | Disposition: A | Discharge status: Home / Self Care | Payer: Self-pay | Source: Ambulatory Visit | Attending: Interventional Cardiology

## 2016-07-22 ENCOUNTER — Ambulatory Visit (HOSPITAL_COMMUNITY)
Admission: RE | Admit: 2016-07-22 | Discharge: 2016-07-22 | Disposition: A | Discharge status: Home / Self Care | Payer: Medicare Other | Source: Ambulatory Visit | Attending: Interventional Cardiology | Admitting: Interventional Cardiology

## 2016-07-22 DIAGNOSIS — Z87891 Personal history of nicotine dependence: Secondary | ICD-10-CM | POA: Insufficient documentation

## 2016-07-22 DIAGNOSIS — Z955 Presence of coronary angioplasty implant and graft: Secondary | ICD-10-CM | POA: Diagnosis not present

## 2016-07-22 DIAGNOSIS — I201 Angina pectoris with documented spasm: Secondary | ICD-10-CM | POA: Diagnosis present

## 2016-07-22 DIAGNOSIS — I4581 Long QT syndrome: Secondary | ICD-10-CM | POA: Diagnosis not present

## 2016-07-22 DIAGNOSIS — Z79899 Other long term (current) drug therapy: Secondary | ICD-10-CM | POA: Insufficient documentation

## 2016-07-22 DIAGNOSIS — I10 Essential (primary) hypertension: Secondary | ICD-10-CM | POA: Insufficient documentation

## 2016-07-22 DIAGNOSIS — K449 Diaphragmatic hernia without obstruction or gangrene: Secondary | ICD-10-CM | POA: Diagnosis not present

## 2016-07-22 DIAGNOSIS — I25111 Atherosclerotic heart disease of native coronary artery with angina pectoris with documented spasm: Secondary | ICD-10-CM | POA: Diagnosis not present

## 2016-07-22 DIAGNOSIS — I252 Old myocardial infarction: Secondary | ICD-10-CM | POA: Diagnosis not present

## 2016-07-22 DIAGNOSIS — F410 Panic disorder [episodic paroxysmal anxiety] without agoraphobia: Secondary | ICD-10-CM | POA: Diagnosis not present

## 2016-07-22 DIAGNOSIS — I2510 Atherosclerotic heart disease of native coronary artery without angina pectoris: Secondary | ICD-10-CM | POA: Diagnosis present

## 2016-07-22 DIAGNOSIS — I209 Angina pectoris, unspecified: Secondary | ICD-10-CM

## 2016-07-22 DIAGNOSIS — E7800 Pure hypercholesterolemia, unspecified: Secondary | ICD-10-CM | POA: Insufficient documentation

## 2016-07-22 DIAGNOSIS — K219 Gastro-esophageal reflux disease without esophagitis: Secondary | ICD-10-CM | POA: Diagnosis not present

## 2016-07-22 DIAGNOSIS — E785 Hyperlipidemia, unspecified: Secondary | ICD-10-CM | POA: Diagnosis present

## 2016-07-22 DIAGNOSIS — Z7982 Long term (current) use of aspirin: Secondary | ICD-10-CM | POA: Insufficient documentation

## 2016-07-22 HISTORY — PX: CARDIAC CATHETERIZATION: SHX172

## 2016-07-22 SURGERY — LEFT HEART CATH AND CORONARY ANGIOGRAPHY
Anesthesia: LOCAL

## 2016-07-22 MED ORDER — SODIUM CHLORIDE 0.9% FLUSH: 3.0000 mL | INTRAVENOUS | Status: DC | PRN

## 2016-07-22 MED ORDER — HEPARIN SODIUM (PORCINE) 1000 UNIT/ML IJ SOLN
INTRAMUSCULAR | Status: AC
  Filled 2016-07-22: qty 1

## 2016-07-22 MED ORDER — LIDOCAINE HCL (PF) 1 % IJ SOLN
INTRAMUSCULAR | Status: AC
  Filled 2016-07-22: qty 30

## 2016-07-22 MED ORDER — SODIUM CHLORIDE 0.9% FLUSH: 3.0000 mL | Freq: Two times a day (BID) | INTRAVENOUS | Status: DC

## 2016-07-22 MED ORDER — IOPAMIDOL (ISOVUE-370) INJECTION 76%
INTRAVENOUS | Status: AC
  Filled 2016-07-22: qty 100

## 2016-07-22 MED ORDER — FENTANYL CITRATE (PF) 100 MCG/2ML IJ SOLN
INTRAMUSCULAR | Status: DC | PRN
  Administered 2016-07-22 (×2): 50 ug via INTRAVENOUS

## 2016-07-22 MED ORDER — MIDAZOLAM HCL 2 MG/2ML IJ SOLN
INTRAMUSCULAR | Status: AC
  Filled 2016-07-22: qty 2

## 2016-07-22 MED ORDER — SODIUM CHLORIDE 0.9 % IV SOLN: 250.0000 mL | INTRAVENOUS | Status: DC | PRN

## 2016-07-22 MED ORDER — LIDOCAINE HCL (PF) 1 % IJ SOLN
INTRAMUSCULAR | Status: DC | PRN
  Administered 2016-07-22: 2 mL via INTRADERMAL

## 2016-07-22 MED ORDER — VERAPAMIL HCL 2.5 MG/ML IV SOLN
INTRAVENOUS | Status: AC
  Filled 2016-07-22: qty 2

## 2016-07-22 MED ORDER — OXYCODONE-ACETAMINOPHEN 5-325 MG PO TABS: 1.0000 | ORAL_TABLET | ORAL | Status: DC | PRN

## 2016-07-22 MED ORDER — SODIUM CHLORIDE 0.9 % IV SOLN: INTRAVENOUS | Status: AC

## 2016-07-22 MED ORDER — HEPARIN (PORCINE) IN NACL 2-0.9 UNIT/ML-% IJ SOLN
INTRAMUSCULAR | Status: AC
  Filled 2016-07-22: qty 1000

## 2016-07-22 MED ORDER — IOPAMIDOL (ISOVUE-370) INJECTION 76%
INTRAVENOUS | Status: DC | PRN
  Administered 2016-07-22: 50 mL via INTRA_ARTERIAL

## 2016-07-22 MED ORDER — SODIUM CHLORIDE 0.9 % WEIGHT BASED INFUSION
1.0000 mL/kg/h | INTRAVENOUS | Status: DC
  Administered 2016-07-22: 250 mL via INTRAVENOUS

## 2016-07-22 MED ORDER — ACETAMINOPHEN 325 MG PO TABS
ORAL_TABLET | ORAL | Status: AC
  Administered 2016-07-22: 650 mg via ORAL
  Filled 2016-07-22: qty 2

## 2016-07-22 MED ORDER — FENTANYL CITRATE (PF) 100 MCG/2ML IJ SOLN
INTRAMUSCULAR | Status: AC
  Filled 2016-07-22: qty 2

## 2016-07-22 MED ORDER — HEPARIN SODIUM (PORCINE) 1000 UNIT/ML IJ SOLN
INTRAMUSCULAR | Status: DC | PRN
  Administered 2016-07-22: 3500 [IU] via INTRAVENOUS

## 2016-07-22 MED ORDER — MIDAZOLAM HCL 2 MG/2ML IJ SOLN
INTRAMUSCULAR | Status: DC | PRN
  Administered 2016-07-22 (×2): 1 mg via INTRAVENOUS

## 2016-07-22 MED ORDER — NITROGLYCERIN 1 MG/10 ML FOR IR/CATH LAB
INTRA_ARTERIAL | Status: DC | PRN
  Administered 2016-07-22: 200 ug via INTRACORONARY

## 2016-07-22 MED ORDER — NITROGLYCERIN 1 MG/10 ML FOR IR/CATH LAB
INTRA_ARTERIAL | Status: AC
  Filled 2016-07-22: qty 10

## 2016-07-22 MED ORDER — SODIUM CHLORIDE 0.9 % WEIGHT BASED INFUSION
3.0000 mL/kg/h | INTRAVENOUS | Status: AC
  Administered 2016-07-22: 3 mL/kg/h via INTRAVENOUS

## 2016-07-22 MED ORDER — ACETAMINOPHEN 325 MG PO TABS
650.0000 mg | ORAL_TABLET | ORAL | Status: DC | PRN
  Administered 2016-07-22: 650 mg via ORAL

## 2016-07-22 MED ORDER — ASPIRIN 81 MG PO CHEW
CHEWABLE_TABLET | ORAL | Status: AC
  Filled 2016-07-22: qty 1

## 2016-07-22 MED ORDER — HEPARIN (PORCINE) IN NACL 2-0.9 UNIT/ML-% IJ SOLN
INTRAMUSCULAR | Status: DC | PRN
  Administered 2016-07-22: 1000 mL

## 2016-07-22 MED ORDER — ONDANSETRON HCL 4 MG/2ML IJ SOLN: 4.0000 mg | Freq: Four times a day (QID) | INTRAMUSCULAR | Status: DC | PRN

## 2016-07-22 MED ORDER — ASPIRIN 81 MG PO CHEW
324.0000 mg | CHEWABLE_TABLET | ORAL | Status: AC
  Administered 2016-07-22: 324 mg via ORAL

## 2016-07-22 MED ORDER — VERAPAMIL HCL 2.5 MG/ML IV SOLN
INTRAVENOUS | Status: DC | PRN
  Administered 2016-07-22: 10 mL via INTRA_ARTERIAL
  Administered 2016-07-22: 10:00:00 via INTRA_ARTERIAL

## 2016-07-22 MED ORDER — ASPIRIN 81 MG PO CHEW
CHEWABLE_TABLET | ORAL | Status: AC
  Filled 2016-07-22: qty 3

## 2016-07-22 SURGICAL SUPPLY — 12 items
CATH EXPO 5FR FR4 (CATHETERS) ×1 IMPLANT
CATH INFINITI 4FR JL3.5 (CATHETERS) ×1 IMPLANT
DEVICE RAD COMP TR BAND LRG (VASCULAR PRODUCTS) ×1 IMPLANT
GLIDESHEATH SLEND A-KIT 6F 22G (SHEATH) ×1 IMPLANT
GLIDESHEATH SLEND SS 6F .021 (SHEATH) ×1 IMPLANT
GUIDEWIRE INQWIRE 1.5J.035X260 (WIRE) IMPLANT
INQWIRE 1.5J .035X260CM (WIRE) ×2
KIT HEART LEFT (KITS) ×2 IMPLANT
PACK CARDIAC CATHETERIZATION (CUSTOM PROCEDURE TRAY) ×2 IMPLANT
TRANSDUCER W/STOPCOCK (MISCELLANEOUS) ×2 IMPLANT
TUBING CIL FLEX 10 FLL-RA (TUBING) ×2 IMPLANT
WIRE HI TORQ VERSACORE-J 145CM (WIRE) ×1 IMPLANT

## 2016-07-22 NOTE — Discharge Instructions (Signed)

## 2016-07-22 NOTE — H&P (View-Only) (Signed)
Cardiology Office Note    Date:  07/10/2016   ID:  [name], DOB 07-11-1946, MRN [id number]  PCP:  [name], MD  Cardiologist: [name], MD   Chief Complaint  Patient presents with  . Coronary Artery Disease    History of Present Illness:  [name] is a 70 y.o. female who presents for obstructive coronary disease with prior stenting, well documented recurrent coronary spasm, recurrent syncope with neurally mediated features, hypertension, hyperlipidemia, tobacco abuse, and gastroesophageal reflux.  Significant progression of limitations from chest discomfort. Episodes occur at rest, usually early in the morning after awakening. She now has predictable exertional chest discomfort that is relieved by rest and sublingual nitroglycerin. Last coronary arteriogram was performed in 2014. She had moderate three-vessel coronary obstruction. She has a history of prior stenting greater than 10 years ago.   Past Medical History:  Diagnosis Date  . Anemia   . Anxiety   . Blood transfusion   . Chest pain 02/04/2016  . Coronary artery disease   . Coronary artery spasm, wth continued episodes of chest pain.  09/26/2013  . Coronary vasospasm ([place])   . GERD (gastroesophageal reflux disease)   . Hiatal hernia   . Hypercholesteremia   . Hypertension   . NSTEMI (non-ST elevated myocardial infarction) ([place])   . NSVT (nonsustained ventricular tachycardia) ([place]) 09/26/2013  . Panic attack     Past Surgical History:  Procedure Laterality Date  . ABDOMINAL HYSTERECTOMY     PARTIAL HYSTERECTOMY  . breast     right, tumor   . CARDIAC CATHETERIZATION  2014  . CARDIAC CATHETERIZATION  2012  . cardiac stent  2012   to RCA  . KNEE SURGERY  left  . LEFT HEART CATHETERIZATION WITH CORONARY ANGIOGRAM N/A 02/22/2013   Procedure: LEFT HEART CATHETERIZATION WITH CORONARY ANGIOGRAM;  Surgeon: [name], MD;  Location: [facility] CATH LAB;  Service: Cardiovascular;  Laterality:  N/A;  . SHOULDER SURGERY      Current Medications: Outpatient Medications Prior to Visit  Medication Sig Dispense Refill  . aspirin EC 81 MG tablet Take 81 mg by mouth daily.    Marland Kitchen buPROPion (ZYBAN) 150 MG 12 hr tablet Take 150 mg by mouth daily.  11  . calcium-vitamin D (OSCAL WITH D) 500-200 MG-UNIT per tablet Take 1 tablet by mouth 2 (two) times daily.    . citalopram (CELEXA) 20 MG tablet Take 20 mg by mouth daily.    . clonazePAM (KLONOPIN) 0.5 MG tablet Take 0.5 mg by mouth daily as needed for anxiety. For anxiety    . ferrous sulfate 325 (65 FE) MG tablet Take 325 mg by mouth daily with breakfast.    . isosorbide mononitrate (IMDUR) 60 MG 24 hr tablet Take 1 tablet (60 mg total) by mouth daily. 30 tablet 11  . nitroGLYCERIN (NITROSTAT) 0.4 MG SL tablet ONE TABLET UNDER TONGUE AS NEEDED FOR CHEST PAIN EVERY 5 MINUTES UP TO 3 DOSES, THEN SEEK MEDICAL ATTENTION 25 tablet 0  . pantoprazole (PROTONIX) 40 MG tablet Take 1 tablet (40 mg total) by mouth 2 (two) times daily. 60 tablet 2  . ramipril (ALTACE) 2.5 MG capsule Take 2 capsules (5 mg total) by mouth at bedtime. 30 capsule 0  . RANEXA 500 MG 12 hr tablet TAKE 1 TABLET(500 MG) BY MOUTH TWICE DAILY 60 tablet 11  . ranitidine (ZANTAC) 150 MG tablet Take 150 mg by mouth 2 (two) times daily.    . simvastatin (  ZOCOR) 20 MG tablet Take 1 tablet (20 mg total) by mouth every evening. 30 tablet 11  . temazepam (RESTORIL) 15 MG capsule Take 15 mg by mouth at bedtime as needed for sleep.     Marland Kitchen tiZANidine (ZANAFLEX) 4 MG tablet Take 4 mg by mouth every 6 (six) hours as needed for muscle spasms.      No facility-administered medications prior to visit.      Allergies:   Patient has no known allergies.   Social History   Social History  . Marital status: Married    Spouse name: [name]  . Number of children: 0  . Years of education: N/A   Social History Main Topics  . Smoking status: Former Smoker    Packs/day: 1.00    Years: 40.00     Types: Cigarettes    Quit date: 01/10/2016  . Smokeless tobacco: Former Systems developer     Comment: " [place] "  . Alcohol use 0.6 oz/week    1 Cans of beer per week     Comment: occasional  . Drug use: No  . Sexual activity: No   Other Topics Concern  . None   Social History Narrative   Lives with husband. Ambulates independently.     Family History:  The patient's family history includes Heart failure in her mother; Stroke in her sister.   ROS:   Please see the history of present illness.    Depression, back discomfort, rash, dizziness, recurrent syncope, headaches, anxiety, constipation, snoring, leg pain, hearing loss, and chest pain as noted above.  All other systems reviewed and are negative.   PHYSICAL EXAM:   VS:  BP 124/72 (BP Location: Right Arm)   Pulse 77   Ht 5\' 4"  (1.626 m)   Wt 131 lb 1.9 oz (59.5 kg)   BMI 22.51 kg/m    GEN: Well nourished, well developed, in no acute distress  HEENT: normal  Neck: no JVD, carotid bruits, or masses Cardiac: RRR; no murmurs, rubs, or gallops,no edema  Respiratory:  clear to auscultation bilaterally, normal work of breathing GI: soft, nontender, nondistended, + BS MS: no deformity or atrophy  Skin: warm and dry, no rash Neuro:  Alert and Oriented x 3, Strength and sensation are intact Psych: euthymic mood, full affect  Wt Readings from Last 3 Encounters:  07/10/16 131 lb 1.9 oz (59.5 kg)  02/05/16 122 lb 8 oz (55.6 kg)  06/29/15 119 lb 3.2 oz (54.1 kg)      Studies/Labs Reviewed:   EKG:  EKG Is not repeated.  Recent Labs: 02/04/2016: ALT 14; BUN 9; Creatinine, Ser 0.89; Hemoglobin 14.2; Platelets 244; Potassium 4.0; Sodium 139 02/05/2016: TSH 1.242   Lipid Panel    Component Value Date/Time   CHOL 158 02/05/2016 0413   TRIG 103 02/05/2016 0413   HDL 73 02/05/2016 0413   CHOLHDL 2.2 02/05/2016 0413   VLDL 21 02/05/2016 0413   LDLCALC 64 02/05/2016 0413    Additional studies/ records that were reviewed today  include:  Reviewed last cardiac catheterization. Reviewed the nuclear study from 02/2016:  Study Highlights    Nuclear stress EF: 37%.  The left ventricular ejection fraction is moderately decreased (30-44%).  There was no ST segment deviation noted during stress.  Defect 1: There is a large defect of severe severity present in the basal anterolateral and mid anterolateral location.  Findings consistent with prior myocardial infarction.  This is an intermediate risk study.   Intermediate  risk stress nuclear study with large prior lateral infarct; no significant ischemia; EF 37 with global hypokinesis and akinesis of the lateral wall; mild LVE; interpreted as intermediate risk due to reduced LV function.        ASSESSMENT:    1. Coronary artery spasm, wth continued episodes of chest pain.    2. CAD in native artery   3. Essential hypertension   4. Syncope, unspecified syncope type   5. Pre-procedure lab exam      PLAN:  In order of problems listed above:  1. She is having progressive angina with symptoms compatible with exertional angina which would imply fixed disease rather than vasospastic disease as she has been proven to have previously. I have recommended that she undergo diagnostic coronary angiography with possible PCI if indicated. 2. Controlled. 3. Probably neurally mediated.  The patient was counseled to undergo left heart catheterization, coronary angiography, and possible percutaneous coronary intervention with stent implantation. The procedural risks and benefits were discussed in detail. The risks discussed included death, stroke, myocardial infarction, life-threatening bleeding, limb ischemia, kidney injury, allergy, and possible emergency cardiac surgery. The risk of these significant complications were estimated to occur less than 1% of the time. After discussion, the patient has agreed to proceed.    Medication Adjustments/Labs and Tests  Ordered: Current medicines are reviewed at length with the patient today.  Concerns regarding medicines are outlined above.  Medication changes, Labs and Tests ordered today are listed in the Patient Instructions below. Patient Instructions  Medication Instructions:  None  Labwork: CBC, INR and BMET prior to catheterization.  Testing/Procedures: Your physician has requested that you have a cardiac catheterization. Cardiac catheterization is used to diagnose and/or treat various heart conditions. Doctors may recommend this procedure for a number of different reasons. The most common reason is to evaluate chest pain. Chest pain can be a symptom of coronary artery disease (CAD), and cardiac catheterization can show whether plaque is narrowing or blocking your heart's arteries. This procedure is also used to evaluate the valves, as well as measure the blood flow and oxygen levels in different parts of your heart. For further information please visit HugeFiesta.[email or web address]. Please follow instruction sheet, as given.   Follow-Up: To be determined after heart catheterization.   Any Other Special Instructions Will Be Listed Below (If Applicable).  Please call Anderson Malta at [phone] and let me know which date you prefer to have your cath done: December 6, 7, 11, 12 or 15.  Please let me know two dates that work for you.    If you need a refill on your cardiac medications before your next appointment, please call your pharmacy.      Signed, [name], MD  07/10/2016 12:29 PM    [facility] [address], [place], [place]  [address] Phone: [phone]; Fax: [phone]

## 2016-07-22 NOTE — CV Procedure (Signed)
   Widely patent coronary arteries.  Significant resistance to catheter movement in the right arm either related to recurrent radial access or spasm. We downgraded to 4 French catheters and had no significant difficulty completing the case.  If current symptoms are cardiac in origin, they are related to spasm and not obstructive disease. The stent in the right coronary is widely patent.

## 2016-07-22 NOTE — Interval H&P Note (Signed)
History and Physical Interval Note:  07/22/2016  Cath Lab Visit (complete for each Cath Lab visit)  Clinical Evaluation Leading to the Procedure:   ACS: No.  Non-ACS:    Anginal Classification: CCS [name]  Anti-ischemic medical therapy: Maximal Therapy (2 or more classes of medications)  Non-Invasive Test Results: No non-invasive testing performed  Prior CABG: No previous CABG       8:58 AM  [name]  has presented today for surgery, with the diagnosis of chest pain  The various methods of treatment have been discussed with the patient and family. After consideration of risks, benefits and other options for treatment, the patient has consented to  Procedure(s): Left Heart Cath and Coronary Angiography (N/A) as a surgical intervention .  The patient's history has been reviewed, patient examined, no change in status, stable for surgery.  I have reviewed the patient's chart and labs.  Questions were answered to the patient's satisfaction.     [name]

## 2016-07-28 ENCOUNTER — Telehealth: Payer: Self-pay | Admitting: Interventional Cardiology

## 2016-07-28 NOTE — Telephone Encounter (Signed)
Spoke with pt and she states that she has been having pain in the right wrist area where they went in for catheter.  States the pain goes all the way up her arm and occasionally her middle finger will go numb.  Denies heat, swelling, redness or drainage to the area.  Pt has been taking Aleve for the pain but states this is not helping.  Pt has not applied any kind of compresses to area either.  Advised I would send message to [place] for review and advisement.

## 2016-07-28 NOTE — Telephone Encounter (Signed)
Please advise 

## 2016-07-28 NOTE — Telephone Encounter (Signed)
°  New Prob   Requesting something stronger for pain after cath procedure 6 days ago. Please call.

## 2016-07-28 NOTE — Telephone Encounter (Signed)
Advil 400 mg with meals 3 times per day for 3 days.

## 2016-07-29 NOTE — Telephone Encounter (Signed)
Advised pt of recommendations per Dr. Tamala Julian.  Pt verbalized understanding.

## 2016-08-05 ENCOUNTER — Other Ambulatory Visit: Payer: Self-pay | Admitting: Interventional Cardiology

## 2016-08-07 ENCOUNTER — Telehealth: Payer: Self-pay | Admitting: Interventional Cardiology

## 2016-08-07 MED ORDER — ISOSORBIDE MONONITRATE ER 60 MG PO TB24: 90.0000 mg | ORAL_TABLET | Freq: Every day | ORAL | 11 refills | Status: DC

## 2016-08-07 NOTE — Telephone Encounter (Signed)
I agree with increasing the isosorbide dose. If 90 mg this not help, increased to 120 mg of isosorbide mononitrate daily

## 2016-08-07 NOTE — Telephone Encounter (Signed)
Pt states that since last Friday she has been having CP on and off.  Anytime she is up moving around she begins to sweat.  Pt states she almost passed out on Christmas Day when she was walking to the fridge with the [place].  Complains of sweating, lightheadedness, dizziness, HA and SOB with CP.  Last checked BP on Monday during an episode and it was 130/70.  Pt states she has been taking 3-5 Nitros a day.  States Nitro eases the pain but it does not completely resolve.  Pt states she had some pain this morning but it has eased off at this time but not completely resolved.  Pt has not taken medications yet this morning, other than nitro.  Spoke with Robbie Lis, PA-C and he said to have pt increase her Imdur to 90mg  QD and if symptoms are not any better tomorrow she should report to ER.  He believes this could be related to vasospasms as mentioned in cath report.  Advised pt of recommendations.  Pt verbalized understanding and was in agreement with this plan.  Will route to Dr. Tamala Julian to make him aware.

## 2016-08-07 NOTE — Telephone Encounter (Signed)
New message ° °Pt call requesting to speak with RN. Pt did not want to disclose any further information. Please call back to discuss  °

## 2016-08-13 ENCOUNTER — Telehealth: Payer: Self-pay | Admitting: Interventional Cardiology

## 2016-08-13 NOTE — Telephone Encounter (Signed)
New message ° ° ° °Pt verbalized that she is returning call for rn  °

## 2016-08-13 NOTE — Telephone Encounter (Signed)
Pt feeling much better on increased dose of Imdur.  Advised pt to call back if symptoms come back.  Pt asked about follow up.  Advised I will send message to Dr. Tamala Julian on advisement on when pt needs to follow up. Pt had cath on 07/22/16 and has not been seen since then.

## 2016-08-13 NOTE — Telephone Encounter (Signed)
Left message to call back  

## 2016-08-14 NOTE — Telephone Encounter (Signed)
4-6 months. Important to stay warm!

## 2016-08-15 NOTE — Telephone Encounter (Signed)
Informed pt of information provided by Dr. Tamala Julian.  Pt verbalized understanding and was in agreement with plan.

## 2016-10-24 ENCOUNTER — Other Ambulatory Visit: Payer: Self-pay | Admitting: Gastroenterology

## 2016-10-24 ENCOUNTER — Encounter (HOSPITAL_COMMUNITY): Payer: Self-pay | Admitting: *Deleted

## 2016-10-28 ENCOUNTER — Ambulatory Visit (HOSPITAL_COMMUNITY)
Admission: RE | Admit: 2016-10-28 | Discharge: 2016-10-28 | Disposition: A | Discharge status: Home / Self Care | Payer: Medicare Other | Source: Ambulatory Visit | Attending: Gastroenterology | Admitting: Gastroenterology

## 2016-10-28 ENCOUNTER — Encounter (HOSPITAL_COMMUNITY): Payer: Self-pay | Admitting: Certified Registered Nurse Anesthetist

## 2016-10-28 ENCOUNTER — Ambulatory Visit (HOSPITAL_COMMUNITY): Payer: Medicare Other | Admitting: Certified Registered Nurse Anesthetist

## 2016-10-28 ENCOUNTER — Encounter (HOSPITAL_COMMUNITY)
Admission: RE | Disposition: A | Discharge status: Home / Self Care | Payer: Self-pay | Source: Ambulatory Visit | Attending: Gastroenterology

## 2016-10-28 DIAGNOSIS — Z79899 Other long term (current) drug therapy: Secondary | ICD-10-CM | POA: Diagnosis not present

## 2016-10-28 DIAGNOSIS — K219 Gastro-esophageal reflux disease without esophagitis: Secondary | ICD-10-CM | POA: Diagnosis not present

## 2016-10-28 DIAGNOSIS — Z7982 Long term (current) use of aspirin: Secondary | ICD-10-CM | POA: Diagnosis not present

## 2016-10-28 DIAGNOSIS — R12 Heartburn: Secondary | ICD-10-CM | POA: Diagnosis present

## 2016-10-28 DIAGNOSIS — I2510 Atherosclerotic heart disease of native coronary artery without angina pectoris: Secondary | ICD-10-CM | POA: Insufficient documentation

## 2016-10-28 DIAGNOSIS — Z90710 Acquired absence of both cervix and uterus: Secondary | ICD-10-CM | POA: Insufficient documentation

## 2016-10-28 DIAGNOSIS — I252 Old myocardial infarction: Secondary | ICD-10-CM | POA: Diagnosis not present

## 2016-10-28 DIAGNOSIS — R1314 Dysphagia, pharyngoesophageal phase: Secondary | ICD-10-CM | POA: Insufficient documentation

## 2016-10-28 DIAGNOSIS — Z955 Presence of coronary angioplasty implant and graft: Secondary | ICD-10-CM | POA: Diagnosis not present

## 2016-10-28 DIAGNOSIS — Z9889 Other specified postprocedural states: Secondary | ICD-10-CM | POA: Insufficient documentation

## 2016-10-28 DIAGNOSIS — I10 Essential (primary) hypertension: Secondary | ICD-10-CM | POA: Diagnosis not present

## 2016-10-28 DIAGNOSIS — K224 Dyskinesia of esophagus: Secondary | ICD-10-CM | POA: Insufficient documentation

## 2016-10-28 DIAGNOSIS — M1990 Unspecified osteoarthritis, unspecified site: Secondary | ICD-10-CM | POA: Diagnosis not present

## 2016-10-28 DIAGNOSIS — E7800 Pure hypercholesterolemia, unspecified: Secondary | ICD-10-CM | POA: Insufficient documentation

## 2016-10-28 DIAGNOSIS — Z87891 Personal history of nicotine dependence: Secondary | ICD-10-CM | POA: Diagnosis not present

## 2016-10-28 HISTORY — PX: ESOPHAGOGASTRODUODENOSCOPY (EGD) WITH PROPOFOL: SHX5813

## 2016-10-28 SURGERY — ESOPHAGOGASTRODUODENOSCOPY (EGD) WITH PROPOFOL
Anesthesia: Monitor Anesthesia Care

## 2016-10-28 MED ORDER — PROPOFOL 10 MG/ML IV BOLUS
INTRAVENOUS | Status: AC
  Filled 2016-10-28: qty 60

## 2016-10-28 MED ORDER — LIDOCAINE 2% (20 MG/ML) 5 ML SYRINGE
INTRAMUSCULAR | Status: DC | PRN
  Administered 2016-10-28: 100 mg via INTRAVENOUS

## 2016-10-28 MED ORDER — PROPOFOL 500 MG/50ML IV EMUL
INTRAVENOUS | Status: DC | PRN
  Administered 2016-10-28: 75 ug/kg/min via INTRAVENOUS

## 2016-10-28 MED ORDER — LIDOCAINE 2% (20 MG/ML) 5 ML SYRINGE
INTRAMUSCULAR | Status: AC
  Filled 2016-10-28: qty 5

## 2016-10-28 MED ORDER — ONDANSETRON HCL 4 MG/2ML IJ SOLN
INTRAMUSCULAR | Status: AC
  Filled 2016-10-28: qty 2

## 2016-10-28 MED ORDER — LACTATED RINGERS IV SOLN
INTRAVENOUS | Status: DC | PRN
Start: 2016-10-28 — End: 2016-10-28
  Administered 2016-10-28: 11:00:00 via INTRAVENOUS

## 2016-10-28 MED ORDER — ONDANSETRON HCL 4 MG/2ML IJ SOLN
INTRAMUSCULAR | Status: DC | PRN
  Administered 2016-10-28: 4 mg via INTRAVENOUS

## 2016-10-28 MED ORDER — PROPOFOL 10 MG/ML IV BOLUS
INTRAVENOUS | Status: DC | PRN
  Administered 2016-10-28: 20 mg via INTRAVENOUS
  Administered 2016-10-28: 30 mg via INTRAVENOUS
  Administered 2016-10-28: 50 mg via INTRAVENOUS
  Administered 2016-10-28: 30 mg via INTRAVENOUS

## 2016-10-28 SURGICAL SUPPLY — 15 items

## 2016-10-28 NOTE — H&P (Signed)
Problem: Heartburn and dysphagia. 06/06/2016 barium upper GI x-ray series showed esophageal dysmotility with full column barium stasis in the esophagus. 05/22/2014 normal esophagogastroduodenoscopy was performed.  History: The patient is a 71 year old female born [birth year]. She takes oral iron on a daily basis. She has esophageal dysphagia with odynophagia. Barium esophagram showed severe esophageal dysmotility but no definite esophageal stricture.  The patient is scheduled to undergo diagnostic esophagogastroduodenoscopy with possible esophageal stricture dilation.  If esophagogastroduodenoscopy is normal, I will schedule her for an esophageal manometry to look for achalasia.  Past medical history: Hypercholesterolemia. Coronary artery disease. 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 Hypertension. Osteoarthritis. Atherosclerosis of the aorta. Coronary artery stent. Hysterectomy. Bilateral rotator cuff repair surgery. Left knee surgery. Right breast surgery.  Exam: The patient is alert and lying comfortably on the endoscopy stretcher. Abdomen is soft and nontender to palpation. Lungs are clear to auscultation. Cardiac exam reveals a regular rhythm.  Plan: Proceed with diagnostic esophagogastroduodenoscopy with possible esophageal stricture dilation.

## 2016-10-28 NOTE — Discharge Instructions (Signed)
YOU HAD AN ENDOSCOPIC PROCEDURE TODAY: Refer to the procedure report and other information in the discharge instructions given to you for any specific questions about what was found during the examination. If this information does not answer your questions, please call Dr. Wynetta Emery office at [phone] to clarify.   YOU SHOULD EXPECT: Some feelings of bloating in the abdomen. Passage of more gas than usual. Walking can help get rid of the air that was put into your GI tract during the procedure and reduce the bloating. If you had a lower endoscopy (such as a colonoscopy or flexible sigmoidoscopy) you may notice spotting of blood in your stool or on the toilet paper. Some abdominal soreness may be present for a day or two, also.  DIET: Your first meal following the procedure should be a light meal and then it is ok to progress to your normal diet. A half-sandwich or bowl of soup is an example of a good first meal. Heavy or fried foods are harder to digest and may make you feel nauseous or bloated. Drink plenty of fluids but you should avoid alcoholic beverages for 24 hours. If you had a esophageal dilation, please see attached instructions for diet.   ACTIVITY: Your care partner should take you home directly after the procedure. You should plan to take it easy, moving slowly for the rest of the day. You can resume normal activity the day after the procedure however YOU SHOULD NOT DRIVE, use power tools, machinery or perform tasks that involve climbing or major physical exertion for 24 hours (because of the sedation medicines used during the test).   SYMPTOMS TO REPORT IMMEDIATELY: A gastroenterologist can be reached at any hour. Please call [phone] for any of the following symptoms:   Following upper endoscopy (EGD, EUS, ERCP, esophageal dilation) Vomiting of blood or coffee ground material  New, significant abdominal pain  New, significant chest pain or pain under the shoulder blades  Painful or  persistently difficult swallowing  New shortness of breath  Black, tarry-looking or red, bloody stools  FOLLOW UP:  If any biopsies were taken you will be contacted by phone or by letter within the next 1-3 weeks. Call [phone]  if you have not heard about the biopsies in 3 weeks.  Please also call with any specific questions about appointments or follow up tests.

## 2016-10-28 NOTE — Anesthesia Postprocedure Evaluation (Addendum)
Anesthesia Post Note  Patient: [name]  Procedure(s) Performed: Procedure(s) (LRB): ESOPHAGOGASTRODUODENOSCOPY (EGD) WITH PROPOFOL (N/A)  Patient location during evaluation: Endoscopy Anesthesia Type: MAC Level of consciousness: awake and alert Pain management: pain level controlled Vital Signs Assessment: post-procedure vital signs reviewed and stable Respiratory status: spontaneous breathing, nonlabored ventilation, respiratory function stable and patient connected to nasal cannula oxygen Cardiovascular status: stable and blood pressure returned to baseline Anesthetic complications: no       Last Vitals:  Vitals:   10/28/16 1110 10/28/16 1120  BP: 114/69 137/67  Pulse: 62 62  Resp: 15 20  Temp:      Last Pain:  Vitals:   10/28/16 1100  TempSrc: Oral                 Nolan Tuazon,JAMES TERRILL

## 2016-10-28 NOTE — Anesthesia Preprocedure Evaluation (Signed)
Anesthesia Evaluation  Patient identified by MRN, date of birth, ID band Patient awake    Reviewed: Allergy & Precautions, NPO status , Patient's Chart, lab work & pertinent test results  Airway Mallampati: I  TM Distance: >3 FB     Dental  (+) Teeth Intact   Pulmonary neg pulmonary ROS, former smoker,    breath sounds clear to auscultation       Cardiovascular hypertension, + angina + CAD and + Past MI   Rhythm:Regular Rate:Normal     Neuro/Psych    GI/Hepatic Neg liver ROS, hiatal hernia, GERD  ,  Endo/Other  negative endocrine ROS  Renal/GU negative Renal ROS     Musculoskeletal   Abdominal   Peds  Hematology   Anesthesia Other Findings   Reproductive/Obstetrics                             Anesthesia Physical Anesthesia Plan  ASA: III  Anesthesia Plan:    Post-op Pain Management:    Induction: Intravenous  Airway Management Planned: Natural Airway and Nasal Cannula  Additional Equipment:   Intra-op Plan:   Post-operative Plan:   Informed Consent: I have reviewed the patients History and Physical, chart, labs and discussed the procedure including the risks, benefits and alternatives for the proposed anesthesia with the patient or authorized representative who has indicated his/her understanding and acceptance.     Plan Discussed with: CRNA  Anesthesia Plan Comments:         Anesthesia Quick Evaluation

## 2016-10-28 NOTE — Transfer of Care (Signed)
Immediate Anesthesia Transfer of Care Note  Patient: [name]  Procedure(s) Performed: Procedure(s): ESOPHAGOGASTRODUODENOSCOPY (EGD) WITH PROPOFOL (N/A)  Patient Location: ENDO  Anesthesia Type:MAC  Level of Consciousness:  sedated, patient cooperative and responds to stimulation  Airway & Oxygen Therapy:Patient Spontanous Breathing and Patient connected to face mask oxgen  Post-op Assessment:  Report given to ENDO RN and Post -op Vital signs reviewed and stable  Post vital signs:  Reviewed and stable  Last Vitals:  Vitals:   10/28/16 1022  BP: 122/62  Pulse: 62  Temp: 10.2 C    Complications: No apparent anesthesia complications

## 2016-10-28 NOTE — Op Note (Signed)
[facility] Patient Name: [name] Procedure Date: 10/28/2016 MRN: [id number] Attending MD: Garlan Fair , MD Date of Birth: June 16, 1946 CSN: [id number] Age: 71 Admit Type: Outpatient Procedure:                Upper GI endoscopy Indications:              Dysphagia, Odynophagia Providers:                Garlan Fair, MD, Elmer Ramp. Tilden Dome, RN,                            William Dalton, Technician Referring MD:              Medicines:                Propofol per Anesthesia Complications:            No immediate complications. Estimated Blood Loss:     Estimated blood loss: none. Procedure:                Pre-Anesthesia Assessment:                           - Prior to the procedure, a History and Physical                            was performed, and patient medications and                            allergies were reviewed. The patient's tolerance of                            previous anesthesia was also reviewed. The risks                            and benefits of the procedure and the sedation                            options and risks were discussed with the patient.                            All questions were answered, and informed consent                            was obtained. Prior Anticoagulants: The patient has                            taken no previous anticoagulant or antiplatelet                            agents. ASA Grade Assessment: II - A patient with                            mild systemic disease. After reviewing the risks  and benefits, the patient was deemed in                            satisfactory condition to undergo the procedure.                           After obtaining informed consent, the endoscope was                            passed under direct vision. Throughout the                            procedure, the patient's blood pressure, pulse, and                            oxygen  saturations were monitored continuously. The                            EG-[id number] ([id number]) scope was introduced through the                            mouth, and advanced to the second part of duodenum.                            The upper GI endoscopy was accomplished without                            difficulty. The patient tolerated the procedure                            well. Scope In: Scope Out: Findings:      The Z-line was regular and was found 35 cm from the incisors.      The examined esophagus was normal.      The entire examined stomach was normal.      The examined duodenum was normal. Impression:               - Z-line regular, 35 cm from the incisors.                           - Normal esophagus.                           - Normal stomach.                           - Normal examined duodenum.                           - No specimens collected. Moderate Sedation:      N/A- Per Anesthesia Care Recommendation:           - Patient has a contact number available for                            emergencies. The signs and symptoms of potential  delayed complications were discussed with the                            patient. Return to normal activities tomorrow.                            Written discharge instructions were provided to the                            patient.                           - Perform high resolution esophageal manometry at                            appointment to be scheduled to R/O achalasia.                           - Resume previous diet.                           - Continue present medications. Procedure Code(s):        --- Professional ---                           [phone], Esophagogastroduodenoscopy, flexible,                            transoral; diagnostic, including collection of                            specimen(s) by brushing or washing, when performed                            (separate  procedure) Diagnosis Code(s):        --- Professional ---                           R13.10, Dysphagia, unspecified CPT copyright 2016 [employer]. All rights reserved. The codes documented in this report are preliminary and upon coder review may  be revised to meet current compliance requirements. Earle Gell, MD Garlan Fair, MD 10/28/2016 10:57:19 AM This report has been signed electronically. Number of Addenda: 0

## 2016-10-29 ENCOUNTER — Encounter (HOSPITAL_COMMUNITY)
Admission: RE | Disposition: A | Discharge status: Home / Self Care | Payer: Self-pay | Source: Ambulatory Visit | Attending: Gastroenterology

## 2016-10-29 ENCOUNTER — Encounter (HOSPITAL_COMMUNITY): Payer: Self-pay | Admitting: Gastroenterology

## 2016-10-29 ENCOUNTER — Ambulatory Visit (HOSPITAL_COMMUNITY)
Admission: RE | Admit: 2016-10-29 | Discharge: 2016-10-29 | Disposition: A | Discharge status: Home / Self Care | Payer: Medicare Other | Source: Ambulatory Visit | Attending: Gastroenterology | Admitting: Gastroenterology

## 2016-10-29 DIAGNOSIS — K224 Dyskinesia of esophagus: Secondary | ICD-10-CM | POA: Diagnosis not present

## 2016-10-29 DIAGNOSIS — R1310 Dysphagia, unspecified: Secondary | ICD-10-CM | POA: Diagnosis present

## 2016-10-29 HISTORY — PX: ESOPHAGEAL MANOMETRY: SHX5429

## 2016-10-29 SURGERY — MANOMETRY, ESOPHAGUS

## 2016-10-29 MED ORDER — LIDOCAINE VISCOUS 2 % MT SOLN
OROMUCOSAL | Status: AC
  Filled 2016-10-29: qty 15

## 2016-10-29 SURGICAL SUPPLY — 2 items
FACESHIELD LNG OPTICON STERILE (SAFETY) IMPLANT
GLOVE BIO SURGEON STRL SZ8 (GLOVE) ×4 IMPLANT

## 2016-10-29 NOTE — Progress Notes (Signed)
Esophageal manometry done per protocol.  Pt tolerated wlell without complication.  Dr. Wynetta Emery to be notified today.  Laverta Baltimore, RN

## 2016-11-27 ENCOUNTER — Other Ambulatory Visit: Payer: Self-pay | Admitting: Internal Medicine

## 2016-11-27 DIAGNOSIS — Z1231 Encounter for screening mammogram for malignant neoplasm of breast: Secondary | ICD-10-CM

## 2016-12-21 ENCOUNTER — Emergency Department (HOSPITAL_COMMUNITY): Payer: Medicare Other

## 2016-12-21 ENCOUNTER — Emergency Department (HOSPITAL_COMMUNITY)
Admission: EM | Admit: 2016-12-21 | Discharge: 2016-12-21 | Disposition: A | Discharge status: Home / Self Care | Payer: Medicare Other | Attending: Emergency Medicine | Admitting: Emergency Medicine

## 2016-12-21 DIAGNOSIS — Z7982 Long term (current) use of aspirin: Secondary | ICD-10-CM | POA: Diagnosis not present

## 2016-12-21 DIAGNOSIS — R5383 Other fatigue: Secondary | ICD-10-CM | POA: Insufficient documentation

## 2016-12-21 DIAGNOSIS — R519 Headache, unspecified: Secondary | ICD-10-CM

## 2016-12-21 DIAGNOSIS — I10 Essential (primary) hypertension: Secondary | ICD-10-CM | POA: Insufficient documentation

## 2016-12-21 DIAGNOSIS — I2510 Atherosclerotic heart disease of native coronary artery without angina pectoris: Secondary | ICD-10-CM | POA: Diagnosis not present

## 2016-12-21 DIAGNOSIS — Z79899 Other long term (current) drug therapy: Secondary | ICD-10-CM | POA: Insufficient documentation

## 2016-12-21 DIAGNOSIS — R51 Headache: Secondary | ICD-10-CM | POA: Diagnosis present

## 2016-12-21 DIAGNOSIS — Z87891 Personal history of nicotine dependence: Secondary | ICD-10-CM | POA: Diagnosis not present

## 2016-12-21 LAB — CBC WITH DIFFERENTIAL/PLATELET
Basophils Absolute: 0 10*3/uL (ref 0.0–0.1)
Basophils Relative: 0 %
Eosinophils Absolute: 0.1 10*3/uL (ref 0.0–0.7)
Eosinophils Relative: 2 %
HCT: 38.1 % (ref 36.0–46.0)
Hemoglobin: 12.8 g/dL (ref 12.0–15.0)
Lymphocytes Relative: 31 %
Lymphs Abs: 1.9 10*3/uL (ref 0.7–4.0)
MCH: 31.2 pg (ref 26.0–34.0)
MCHC: 33.6 g/dL (ref 30.0–36.0)
MCV: 92.9 fL (ref 78.0–100.0)
Monocytes Absolute: 0.4 10*3/uL (ref 0.1–1.0)
Monocytes Relative: 6 %
Neutro Abs: 3.6 10*3/uL (ref 1.7–7.7)
Neutrophils Relative %: 61 %
Platelets: 265 10*3/uL (ref 150–400)
RBC: 4.1 MIL/uL (ref 3.87–5.11)
RDW: 14.1 % (ref 11.5–15.5)
WBC: 6 10*3/uL (ref 4.0–10.5)

## 2016-12-21 LAB — BASIC METABOLIC PANEL
Anion gap: 8 (ref 5–15)
BUN: 13 mg/dL (ref 6–20)
CO2: 24 mmol/L (ref 22–32)
Calcium: 8.8 mg/dL — ABNORMAL LOW (ref 8.9–10.3)
Chloride: 104 mmol/L (ref 101–111)
Creatinine, Ser: 0.88 mg/dL (ref 0.44–1.00)
GFR calc Af Amer: >60 mL/min (ref >60)
GFR calc non Af Amer: >60 mL/min (ref >60)
Glucose, Bld: 137 mg/dL — ABNORMAL HIGH (ref 65–99)
Potassium: 4 mmol/L (ref 3.5–5.1)
Sodium: 136 mmol/L (ref 135–145)

## 2016-12-21 MED ORDER — METOCLOPRAMIDE HCL 5 MG/ML IJ SOLN
10.0000 mg | Freq: Once | INTRAMUSCULAR | Status: AC
  Administered 2016-12-21: 10 mg via INTRAVENOUS
  Filled 2016-12-21: qty 2

## 2016-12-21 MED ORDER — ACETAMINOPHEN 500 MG PO TABS
1000.0000 mg | ORAL_TABLET | Freq: Once | ORAL | Status: AC
  Administered 2016-12-21: 1000 mg via ORAL
  Filled 2016-12-21: qty 2

## 2016-12-21 NOTE — Discharge Instructions (Signed)
You can continue to take ibuprofen and tylenol as needed for headache.  Follow-up with your primary care doctor for ongoing management.  Return for worsening symptoms, including confusion, intractable vomiting, difficulty walking, worsening pain or any other symptoms concerning to you.

## 2016-12-21 NOTE — ED Provider Notes (Addendum)
[facility] DEPT Provider Note   CSN: [id number] Arrival date & time: 12/21/16  0867     History   Chief Complaint Chief Complaint  Patient presents with  . Headache    HPI [name] is a 71 y.o. female.  The history is provided by the patient.  Headache   This is a new problem. The current episode started 12 to 24 hours ago. The problem occurs every few hours. The problem has not changed since onset.The headache is associated with nothing. The pain is located in the bilateral and temporal region. The quality of the pain is described as throbbing. The pain is at a severity of 8/10. The pain does not radiate. Associated symptoms include malaise/fatigue. Pertinent negatives include no fever, no chest pressure, no palpitations, no syncope, no shortness of breath, no nausea and no vomiting. She has tried NSAIDs for the symptoms. The treatment provided moderate relief.    71 year old female who presents with headache. She has a history of hypertension, hyperlipidemia, and coronary artery disease. Has not had previous headaches before. States that since yesterday at around 3 PM she has been having intermittent bitemporal and frontal headache. Nagging in nature. Initially relieved with 2 of Aleve, but returned later on in the evening. Take additional 2 of Aleve and the headache eased off. However when she woke up this morning her headache has returned. Currently 8 out of 10 in severity. Denies any vision or speech changes, focal numbness or weakness, nausea or vomiting, fevers or recent infectious illnesses, neck pain or stiffness.  Past Medical History:  Diagnosis Date  . Anemia   . Anxiety   . Blood transfusion   . Chest pain 02/04/2016  . Coronary artery disease   . Coronary artery spasm, wth continued episodes of chest pain.  09/26/2013  . Coronary vasospasm ([place])   . GERD (gastroesophageal reflux disease)   . Hiatal hernia   . Hypercholesteremia   . Hypertension   . NSTEMI  (non-ST elevated myocardial infarction) ([place]) not sure  . NSVT (nonsustained ventricular tachycardia) ([place]) 09/26/2013  . Panic attack     Patient Active Problem List   Diagnosis Date Noted  . Angina pectoris ([place])   . Acute chest pain 02/04/2016  . Chest pain 02/04/2016  . CAD in native artery 11/24/2014  . Syncope 10/22/2014  . Hyperglycemia 04/20/2014  . H/O hiatal hernia 04/19/2014  . NSTEMI (non-ST elevated myocardial infarction) ([place])   . Coronary artery spasm, wth continued episodes of chest pain.  09/26/2013  . Atypical chest pain 12/08/2011  . HTN (hypertension) 12/08/2011  . GERD (gastroesophageal reflux disease) 12/08/2011  . Depression 12/08/2011  . Dyslipidemia 12/08/2011    Past Surgical History:  Procedure Laterality Date  . ABDOMINAL HYSTERECTOMY     PARTIAL HYSTERECTOMY  . breast     right, tumor benign  . CARDIAC CATHETERIZATION  2014  . CARDIAC CATHETERIZATION  2012  . CARDIAC CATHETERIZATION N/A 07/22/2016   Procedure: Left Heart Cath and Coronary Angiography;  Surgeon: Belva Crome, MD;  Location: [facility];  Service: Cardiovascular;  Laterality: N/A;  . cardiac stent  2012   to RCA  . ESOPHAGEAL MANOMETRY N/A 10/29/2016   Procedure: ESOPHAGEAL MANOMETRY (EM);  Surgeon: Garlan Fair, MD;  Location: [facility];  Service: Endoscopy;  Laterality: N/A;  . ESOPHAGOGASTRODUODENOSCOPY (EGD) WITH PROPOFOL N/A 10/28/2016   Procedure: ESOPHAGOGASTRODUODENOSCOPY (EGD) WITH PROPOFOL;  Surgeon: Garlan Fair, MD;  Location: [facility];  Service: Endoscopy;  Laterality: N/A;  . KNEE SURGERY  left  . left ear surgery     for bad cut  . LEFT HEART CATHETERIZATION WITH CORONARY ANGIOGRAM N/A 02/22/2013   Procedure: LEFT HEART CATHETERIZATION WITH CORONARY ANGIOGRAM;  Surgeon: [name], MD;  Location: [facility] CATH LAB;  Service: Cardiovascular;  Laterality: N/A;  . SHOULDER SURGERY Bilateral    rotator cuff    OB History    No data available        Home Medications    Prior to Admission medications   Medication Sig Start Date End Date Taking? Authorizing Provider  aluminum-magnesium hydroxide 200-200 MG/5ML suspension Take 30 mLs by mouth every 6 (six) hours as needed for indigestion.    Yes [provider]  aspirin EC 81 MG tablet Take 81 mg by mouth daily.   Yes [provider]  buPROPion (ZYBAN) 150 MG 12 hr tablet Take 150 mg by mouth daily. 01/29/16  Yes [provider]  calcium-vitamin D (OSCAL WITH D) 500-200 MG-UNIT per tablet Take 1 tablet by mouth daily.    Yes [provider]  citalopram (CELEXA) 20 MG tablet Take 20 mg by mouth daily as needed (depression).    Yes [provider]  clonazePAM (KLONOPIN) 0.5 MG tablet Take 0.5 mg by mouth 2 (two) times daily as needed for anxiety. For anxiety    Yes [provider]  isosorbide mononitrate (IMDUR) 60 MG 24 hr tablet Take 1.5 tablets (90 mg total) by mouth daily. Patient taking differently: Take 90 mg by mouth every evening.  08/07/16  Yes Bhagat, Bhavinkumar, PA  naproxen sodium (ANAPROX) 220 MG tablet Take 440 mg by mouth 2 (two) times daily as needed (pain).    Yes [provider]  nitroGLYCERIN (NITROSTAT) 0.4 MG SL tablet ONE TABLET UNDER TONGUE AS NEEDED FOR CHEST PAIN EVERY 5 MINUTES UP TO 3 DOSES, THEN SEEK MEDICAL ATTENTION 06/10/16  Yes Belva Crome, MD  pantoprazole (PROTONIX) 40 MG tablet Take 1 tablet (40 mg total) by mouth 2 (two) times daily. Patient taking differently: Take 40 mg by mouth daily.  04/20/14  Yes Isaiah Serge, NP  ramipril (ALTACE) 2.5 MG capsule take 1 tablet at bedtime 10/16/16  Yes [provider]  RANEXA 500 MG 12 hr tablet TAKE 1 TABLET(500 MG) BY MOUTH TWICE DAILY 11/22/15  Yes Belva Crome, MD  ranitidine (ZANTAC) 150 MG tablet Take 150 mg by mouth 2 (two) times daily.    Yes [provider]  simvastatin (ZOCOR) 20 MG tablet Take 1 tablet (20 mg total) by  mouth every evening. 05/04/14  Yes Weaver, Scott T, PA-C  temazepam (RESTORIL) 15 MG capsule Take 15 mg by mouth at bedtime as needed for sleep.  02/07/14   [provider]    Family History Family History  Problem Relation Age of Onset  . Heart failure Mother   . Stroke Sister   . Heart attack Unknown     Social History Social History  Substance Use Topics  . Smoking status: Former Smoker    Packs/day: 1.00    Years: 40.00    Types: Cigarettes    Quit date: 01/10/2016  . Smokeless tobacco: Never Used  . Alcohol use 0.6 oz/week    1 Cans of beer per week     Comment: occasional beer     Allergies   Patient has no known allergies.   Review of Systems Review of Systems  Constitutional: Positive  for malaise/fatigue. Negative for fever.  HENT: Negative for congestion.   Respiratory: Negative for shortness of breath.   Cardiovascular: Negative for palpitations and syncope.  Gastrointestinal: Negative for nausea and vomiting.  Musculoskeletal: Negative for neck pain.  Allergic/Immunologic: Negative for immunocompromised state.  Neurological: Positive for headaches.  Hematological: Does not bruise/bleed easily.  Psychiatric/Behavioral: Negative for confusion.  All other systems reviewed and are negative.    Physical Exam Updated Vital Signs BP 116/69   Pulse (!) 52   Temp 98.1 F (36.7 C)   Resp 19   Ht 4\' 11"  (1.499 m)   Wt 136 lb (61.7 kg)   SpO2 98%   BMI 27.47 kg/m   Physical Exam Physical Exam  Nursing note and vitals reviewed. Constitutional: Well developed, well nourished, non-toxic, and in no acute distress Head: Normocephalic and atraumatic. No pain over temporal arteries Mouth/Throat: Oropharynx is clear and moist.  Neck: Normal range of motion. Neck supple.  no nuchal rigidity Cardiovascular: Normal rate and regular rhythm.   Pulmonary/Chest: Effort normal and breath sounds normal.  Abdominal: Soft. There is no tenderness. There is no  rebound and no guarding.  Musculoskeletal: Normal range of motion.  Skin: Skin is warm and dry.  Psychiatric: Cooperative Neurological:  Alert, oriented to person, place, time, and situation. Memory grossly in tact. Fluent speech. No dysarthria or aphasia.  Cranial nerves: VF are full.Pupils are symmetric, and reactive to light. EOMI without nystagmus. No gaze deviation. Facial muscles symmetric with activation. Sensation to light touch over face in tact bilaterally. Hearing grossly in tact. Palate elevates symmetrically. Head turn and shoulder shrug are intact. Tongue midline.  Reflexes defered.  Muscle bulk and tone normal. No pronator drift. Moves all extremities symmetrically. Sensation to light touch is in tact throughout in bilateral upper and lower extremities. Coordination reveals no dysmetria with finger to nose. Gait is narrow-based and steady. Non-ataxic.    ED Treatments / Results  Labs (all labs ordered are listed, but only abnormal results are displayed) Labs Reviewed  BASIC METABOLIC PANEL - Abnormal; Notable for the following:       Result Value   Glucose, Bld 137 (*)    Calcium 8.8 (*)    All other components within normal limits  CBC WITH DIFFERENTIAL/PLATELET    EKG  EKG Interpretation None       Radiology Ct Head Wo Contrast  Result Date: 12/21/2016 CLINICAL DATA:  Pt c/o headache since yesterday  denies N/V EXAM: CT HEAD WITHOUT CONTRAST TECHNIQUE: Contiguous axial images were obtained from the base of the skull through the vertex without intravenous contrast. COMPARISON:  Head CT dated 01/22/2010. FINDINGS: Brain: Mild generalized parenchymal atrophy with commensurate dilatation of the ventricles and sulci. There is no mass, hemorrhage, edema or other evidence of acute parenchymal abnormality. No extra-axial hemorrhage. Vascular: There are chronic calcified atherosclerotic changes of the large vessels at the skull base. No unexpected hyperdense vessel.  Skull: Normal. Negative for fracture or focal lesion. Sinuses/Orbits: No acute finding. Other: None. IMPRESSION: Negative head CT.  No intracranial mass, hemorrhage or edema. Electronically Signed   By: Franki Cabot M.D.   On: 12/21/2016 12:32    Procedures Procedures (including critical care time)  Medications Ordered in ED Medications  acetaminophen (TYLENOL) tablet 1,000 mg (1,000 mg Oral Given 12/21/16 1028)  metoCLOPramide (REGLAN) injection 10 mg (10 mg Intravenous Given 12/21/16 1035)     Initial Impression / Assessment and Plan / ED Course  I have reviewed the  triage vital signs and the nursing notes.  Pertinent labs & imaging results that were available during my care of the patient were reviewed by me and considered in my medical decision making (see chart for details).     Presents with bitemporal headache that has been intermittent since yesterday. She is well-appearing and in no acute distress. She has a normal neurological exam. History and exam at this time does not seem concerning for subarachnoid hemorrhage or meningitis. No pain over temporal arteries or vision changes or symptoms concerning for for temporal arteritis. Given her age CT head was performed. This is visualized and shows no acute intercranial processes. She is given Tylenol and Reglan, and has resolution of her headache. Blood work is reviewed and reassuring. At this time I do not suspect serious intracranial etiologies of her symptoms. I have reviewed strict return and follow-up instructions. She will continue supportive care instructions at home.  She expressed understanding of all discharge instructions and felt comfortable with the plan of care.   Final Clinical Impressions(s) / ED Diagnoses   Final diagnoses:  Headache disorder    New Prescriptions New Prescriptions   No medications on file     Forde Dandy, MD 12/21/16 1333    Forde Dandy, MD 12/21/16 [phone]

## 2016-12-21 NOTE — ED Triage Notes (Signed)
Per EMS- pt here for tension headache. Pt reports she has not taken any medication for this headache. No med allergies.

## 2016-12-24 ENCOUNTER — Ambulatory Visit: Payer: Medicare Other

## 2016-12-25 NOTE — Progress Notes (Signed)
Cardiology Office Note    Date:  12/26/2016   ID:  [name], DOB [birth year], MRN [id number]  PCP:  Jillian Low, MD  Cardiologist: Jillian Grooms, MD   Chief Complaint  Patient presents with  . Coronary Artery Disease    History of Present Illness:  [name] is a 71 y.o. female who presents for obstructive coronary disease with prior stenting, well documented recurrent coronary spasm, recurrent syncope with neurally mediated features, hypertension, hyperlipidemia, tobacco abuse, and gastroesophageal reflux.  Overall, she is doing well. She has occasional chest discomfort. Occasionally uses nitroglycerin. Chest discomfort is not precipitated by physical activity. No recent episodes of syncope. Recently had esophageal manometry 4.  Past Medical History:  Diagnosis Date  . Anemia   . Anxiety   . Blood transfusion   . Chest pain 02/04/2016  . Coronary artery disease   . Coronary artery spasm, wth continued episodes of chest pain.  09/26/2013  . Coronary vasospasm ([place])   . GERD (gastroesophageal reflux disease)   . Hiatal hernia   . Hypercholesteremia   . Hypertension   . NSTEMI (non-ST elevated myocardial infarction) ([place]) not sure  . NSVT (nonsustained ventricular tachycardia) ([place]) 09/26/2013  . Panic attack     Past Surgical History:  Procedure Laterality Date  . ABDOMINAL HYSTERECTOMY     PARTIAL HYSTERECTOMY  . breast     right, tumor benign  . CARDIAC CATHETERIZATION  2014  . CARDIAC CATHETERIZATION  2012  . CARDIAC CATHETERIZATION N/A 07/22/2016   Procedure: Left Heart Cath and Coronary Angiography;  Surgeon: Jillian Crome, MD;  Location: [facility];  Service: Cardiovascular;  Laterality: N/A;  . cardiac stent  2012   to RCA  . ESOPHAGEAL MANOMETRY N/A 10/29/2016   Procedure: ESOPHAGEAL MANOMETRY (EM);  Surgeon: Jillian Fair, MD;  Location: [facility];  Service: Endoscopy;  Laterality: N/A;  . ESOPHAGOGASTRODUODENOSCOPY (EGD) WITH  PROPOFOL N/A 10/28/2016   Procedure: ESOPHAGOGASTRODUODENOSCOPY (EGD) WITH PROPOFOL;  Surgeon: Jillian Fair, MD;  Location: [facility];  Service: Endoscopy;  Laterality: N/A;  . KNEE SURGERY  left  . left ear surgery     for bad cut  . LEFT HEART CATHETERIZATION WITH CORONARY ANGIOGRAM N/A 02/22/2013   Procedure: LEFT HEART CATHETERIZATION WITH CORONARY ANGIOGRAM;  Surgeon: Jillian Grooms, MD;  Location: [facility] CATH LAB;  Service: Cardiovascular;  Laterality: N/A;  . SHOULDER SURGERY Bilateral    rotator cuff    Current Medications: Outpatient Medications Prior to Visit  Medication Sig Dispense Refill  . aluminum-magnesium hydroxide 200-200 MG/5ML suspension Take 30 mLs by mouth every 6 (six) hours as needed for indigestion.     Marland Kitchen aspirin EC 81 MG tablet Take 81 mg by mouth daily.    Marland Kitchen buPROPion (ZYBAN) 150 MG 12 hr tablet Take 150 mg by mouth daily.  11  . calcium-vitamin D (OSCAL WITH D) 500-200 MG-UNIT per tablet Take 1 tablet by mouth daily.     . citalopram (CELEXA) 20 MG tablet Take 20 mg by mouth daily as needed (depression).     . clonazePAM (KLONOPIN) 0.5 MG tablet Take 0.5 mg by mouth 2 (two) times daily as needed for anxiety. For anxiety     . isosorbide mononitrate (IMDUR) 60 MG 24 hr tablet Take 1.5 tablets (90 mg total) by mouth daily. (Patient taking differently: Take 90 mg by mouth every evening. ) 45 tablet 11  . nitroGLYCERIN (NITROSTAT) 0.4 MG SL tablet ONE TABLET  UNDER TONGUE AS NEEDED FOR CHEST PAIN EVERY 5 MINUTES UP TO 3 DOSES, THEN SEEK MEDICAL ATTENTION 25 tablet 0  . pantoprazole (PROTONIX) 40 MG tablet Take 1 tablet (40 mg total) by mouth 2 (two) times daily. (Patient taking differently: Take 40 mg by mouth daily. ) 60 tablet 2  . ramipril (ALTACE) 2.5 MG capsule take 1 tablet at bedtime  2  . RANEXA 500 MG 12 hr tablet TAKE 1 TABLET(500 MG) BY MOUTH TWICE DAILY 60 tablet 11  . ranitidine (ZANTAC) 150 MG tablet Take 150 mg by mouth 2 (two) times daily.     .  simvastatin (ZOCOR) 20 MG tablet Take 1 tablet (20 mg total) by mouth every evening. 30 tablet 11  . temazepam (RESTORIL) 15 MG capsule Take 15 mg by mouth at bedtime as needed for sleep.     . naproxen sodium (ANAPROX) 220 MG tablet Take 440 mg by mouth 2 (two) times daily as needed (pain).      No facility-administered medications prior to visit.      Allergies:   Patient has no known allergies.   Social History   Social History  . Marital status: Married    Spouse name: [name]  . Number of children: 0  . Years of education: N/A   Social History Main Topics  . Smoking status: Former Smoker    Packs/day: 1.00    Years: 40.00    Types: Cigarettes    Quit date: 01/10/2016  . Smokeless tobacco: Never Used  . Alcohol use 0.6 oz/week    1 Cans of beer per week     Comment: occasional beer  . Drug use: No  . Sexual activity: No   Other Topics Concern  . None   Social History Narrative   Lives with husband. Ambulates independently.     Family History:  The patient's family history includes Heart failure in her mother; Stroke in her sister.   ROS:   Please see the history of present illness.    Multiple positive review of systems items including decreased hearing, vision disturbance, abdominal pain, back pain, generalized muscle pain, dizziness, recurring episodes of near-syncope, easy bruising, headaches, anxiety, constipation, snoring, leg pain, sweating and fatigue  All other systems reviewed and are negative.   PHYSICAL EXAM:   VS:  BP (!) 142/86 (BP Location: Left Arm)   Pulse 76   Ht 4\' 11"  (1.499 m)   Wt 136 lb 1.9 oz (61.7 kg)   BMI 27.49 kg/m    GEN: Well nourished, well developed, in no acute distress  HEENT: normal  Neck: no JVD, carotid bruits, or masses Cardiac: RRR; no murmurs, rubs, or gallops,no edema  Respiratory:  clear to auscultation bilaterally, normal work of breathing GI: soft, nontender, nondistended, + BS MS: no deformity or atrophy  Skin:  warm and dry, no rash Neuro:  Alert and Oriented x 3, Strength and sensation are intact Psych: euthymic mood, full affect  Wt Readings from Last 3 Encounters:  12/26/16 136 lb 1.9 oz (61.7 kg)  12/21/16 136 lb (61.7 kg)  10/29/16 134 lb (60.8 kg)      Studies/Labs Reviewed:   EKG:  EKG  EKG is not repeated  Recent Labs: 02/04/2016: ALT 14 02/05/2016: TSH 1.242 12/21/2016: BUN 13; Creatinine, Ser 0.88; Hemoglobin 12.8; Platelets 265; Potassium 4.0; Sodium 136   Lipid Panel    Component Value Date/Time   CHOL 158 02/05/2016 0413   TRIG 103 02/05/2016 0413  HDL 73 02/05/2016 0413   CHOLHDL 2.2 02/05/2016 0413   VLDL 21 02/05/2016 0413   LDLCALC 64 02/05/2016 0413    Additional studies/ records that were reviewed today include:  No new functional or imaging data    ASSESSMENT:    1. Coronary artery disease involving native coronary artery of native heart with angina pectoris with documented spasm ([place])   2. 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 Syncope, unspecified syncope type   5. Dyslipidemia   6. H/O hiatal hernia      PLAN:  In order of problems listed above:  1. Demonstrated over the past 20 years there have cyclical periods of high density chest pain recurrences documented to be related to coronary vasospasm on at least 3 different angiographic studies. No current acute/ongoing problems. Plan continue therapy with long-acting nitrates, ranolazine, and when necessary sublingual nitroglycerin. 2. 2 g sodium diet and antihypertensive therapy is recommended as noted above 3. We discussed the overall cardiac prognosis which is relatively good given no evidence of LV systolic dysfunction. 4. Neurally mediated recurrent dizziness and syncope. Relatively quiet sent lately. 5. LDL cholesterol target is less than 70. Followed by primary care. 6. Being managed by primary care and GI.  Encouraged her to use nitroglycerin early in the cycle of episodes of chest  pain. Prolonged chest pain unrelieved by nitroglycerin should prompt emergency room evaluation. Clinical follow-up in 9-12 months.    Medication Adjustments/Labs and Tests Ordered: Current medicines are reviewed at length with the patient today.  Concerns regarding medicines are outlined above.  Medication changes, Labs and Tests ordered today are listed in the Patient Instructions below. Patient Instructions  Medication Instructions:  None  Labwork: None  Testing/Procedures: None  Follow-Up: Your physician wants you to follow-up in: 9-12 months with Dr. Tamala Julian.  You will receive a reminder letter in the mail two months in advance. If you don't receive a letter, please call our office to schedule the follow-up appointment.   Any Other Special Instructions Will Be Listed Below (If Applicable).     If you need a refill on your cardiac medications before your next appointment, please call your pharmacy.      Signed, Jillian Grooms, MD  12/26/2016 3:11 PM    [facility] [address], [place], [place]  [address] Phone: [phone]; Fax: [phone]

## 2016-12-26 ENCOUNTER — Ambulatory Visit (INDEPENDENT_AMBULATORY_CARE_PROVIDER_SITE_OTHER): Payer: Medicare Other | Admitting: Interventional Cardiology

## 2016-12-26 ENCOUNTER — Encounter: Payer: Self-pay | Admitting: Interventional Cardiology

## 2016-12-26 VITALS — BP 142/86 | HR 76 | Ht 59.0 in | Wt 136.1 lb

## 2016-12-26 DIAGNOSIS — Z8719 Personal history of other diseases of the digestive system: Secondary | ICD-10-CM | POA: Diagnosis not present

## 2016-12-26 DIAGNOSIS — E785 Hyperlipidemia, unspecified: Secondary | ICD-10-CM

## 2016-12-26 DIAGNOSIS — R55 Syncope and collapse: Secondary | ICD-10-CM | POA: Diagnosis not present

## 2016-12-26 DIAGNOSIS — I252 Old myocardial infarction: Secondary | ICD-10-CM

## 2016-12-26 DIAGNOSIS — I10 Essential (primary) hypertension: Secondary | ICD-10-CM | POA: Diagnosis not present

## 2016-12-26 DIAGNOSIS — I25111 Atherosclerotic heart disease of native coronary artery with angina pectoris with documented spasm: Secondary | ICD-10-CM

## 2016-12-26 NOTE — Patient Instructions (Signed)
Medication Instructions:  None  Labwork: None  Testing/Procedures: None  Follow-Up: Your physician wants you to follow-up in: 9-12 months with Dr. Smith.  You will receive a reminder letter in the mail two months in advance. If you don't receive a letter, please call our office to schedule the follow-up appointment.   Any Other Special Instructions Will Be Listed Below (If Applicable).     If you need a refill on your cardiac medications before your next appointment, please call your pharmacy.   

## 2016-12-31 ENCOUNTER — Other Ambulatory Visit: Payer: Self-pay | Admitting: Interventional Cardiology

## 2017-01-09 NOTE — Addendum Note (Signed)
Addendum  created 01/09/17 1237 by Rica Koyanagi, MD   Sign clinical note

## 2017-01-13 ENCOUNTER — Ambulatory Visit
Admission: RE | Admit: 2017-01-13 | Discharge: 2017-01-13 | Disposition: A | Discharge status: Home / Self Care | Payer: Medicare Other | Source: Ambulatory Visit | Attending: Internal Medicine | Admitting: Internal Medicine

## 2017-01-13 DIAGNOSIS — Z1231 Encounter for screening mammogram for malignant neoplasm of breast: Secondary | ICD-10-CM

## 2017-01-23 ENCOUNTER — Encounter (HOSPITAL_COMMUNITY): Payer: Self-pay

## 2017-01-23 ENCOUNTER — Emergency Department (HOSPITAL_COMMUNITY): Payer: Medicare Other

## 2017-01-23 ENCOUNTER — Observation Stay (HOSPITAL_COMMUNITY)
Admission: EM | Admit: 2017-01-23 | Discharge: 2017-01-24 | Disposition: A | Discharge status: Home / Self Care | Payer: Medicare Other | Attending: Internal Medicine | Admitting: Internal Medicine

## 2017-01-23 DIAGNOSIS — I25118 Atherosclerotic heart disease of native coronary artery with other forms of angina pectoris: Secondary | ICD-10-CM

## 2017-01-23 DIAGNOSIS — R519 Headache, unspecified: Secondary | ICD-10-CM | POA: Diagnosis present

## 2017-01-23 DIAGNOSIS — R51 Headache: Secondary | ICD-10-CM | POA: Insufficient documentation

## 2017-01-23 DIAGNOSIS — I252 Old myocardial infarction: Secondary | ICD-10-CM | POA: Diagnosis not present

## 2017-01-23 DIAGNOSIS — R072 Precordial pain: Secondary | ICD-10-CM | POA: Diagnosis present

## 2017-01-23 DIAGNOSIS — F32A Depression, unspecified: Secondary | ICD-10-CM | POA: Diagnosis present

## 2017-01-23 DIAGNOSIS — R0789 Other chest pain: Secondary | ICD-10-CM | POA: Diagnosis present

## 2017-01-23 DIAGNOSIS — I10 Essential (primary) hypertension: Secondary | ICD-10-CM | POA: Diagnosis present

## 2017-01-23 DIAGNOSIS — R079 Chest pain, unspecified: Secondary | ICD-10-CM | POA: Diagnosis not present

## 2017-01-23 DIAGNOSIS — K219 Gastro-esophageal reflux disease without esophagitis: Secondary | ICD-10-CM | POA: Diagnosis present

## 2017-01-23 DIAGNOSIS — I201 Angina pectoris with documented spasm: Secondary | ICD-10-CM

## 2017-01-23 DIAGNOSIS — Z955 Presence of coronary angioplasty implant and graft: Secondary | ICD-10-CM | POA: Insufficient documentation

## 2017-01-23 DIAGNOSIS — Z95820 Peripheral vascular angioplasty status with implants and grafts: Secondary | ICD-10-CM

## 2017-01-23 DIAGNOSIS — Z87891 Personal history of nicotine dependence: Secondary | ICD-10-CM | POA: Insufficient documentation

## 2017-01-23 DIAGNOSIS — E785 Hyperlipidemia, unspecified: Secondary | ICD-10-CM | POA: Diagnosis present

## 2017-01-23 DIAGNOSIS — F329 Major depressive disorder, single episode, unspecified: Secondary | ICD-10-CM | POA: Diagnosis present

## 2017-01-23 DIAGNOSIS — Z7982 Long term (current) use of aspirin: Secondary | ICD-10-CM | POA: Diagnosis not present

## 2017-01-23 DIAGNOSIS — Z79899 Other long term (current) drug therapy: Secondary | ICD-10-CM | POA: Diagnosis not present

## 2017-01-23 DIAGNOSIS — I2510 Atherosclerotic heart disease of native coronary artery without angina pectoris: Secondary | ICD-10-CM | POA: Diagnosis not present

## 2017-01-23 LAB — CBC
HCT: 41.7 % (ref 36.0–46.0)
Hemoglobin: 13.7 g/dL (ref 12.0–15.0)
MCH: 30.6 pg (ref 26.0–34.0)
MCHC: 32.9 g/dL (ref 30.0–36.0)
MCV: 93.3 fL (ref 78.0–100.0)
Platelets: 282 10*3/uL (ref 150–400)
RBC: 4.47 MIL/uL (ref 3.87–5.11)
RDW: 13.5 % (ref 11.5–15.5)
WBC: 6.2 10*3/uL (ref 4.0–10.5)

## 2017-01-23 LAB — BASIC METABOLIC PANEL
Anion gap: 8 (ref 5–15)
BUN: 19 mg/dL (ref 6–20)
CO2: 26 mmol/L (ref 22–32)
Calcium: 9.9 mg/dL (ref 8.9–10.3)
Chloride: 104 mmol/L (ref 101–111)
Creatinine, Ser: 0.98 mg/dL (ref 0.44–1.00)
GFR calc Af Amer: >60 mL/min (ref >60)
GFR calc non Af Amer: 57 mL/min — ABNORMAL LOW (ref >60)
Glucose, Bld: 123 mg/dL — ABNORMAL HIGH (ref 65–99)
Potassium: 4.5 mmol/L (ref 3.5–5.1)
Sodium: 138 mmol/L (ref 135–145)

## 2017-01-23 LAB — I-STAT TROPONIN, ED
Troponin i, poc: 0 ng/mL (ref 0.00–0.08)
Troponin i, poc: 0 ng/mL (ref 0.00–0.08)

## 2017-01-23 MED ORDER — RAMIPRIL 5 MG PO CAPS
5.0000 mg | ORAL_CAPSULE | Freq: Every day | ORAL | Status: DC
  Administered 2017-01-24: 5 mg via ORAL
  Filled 2017-01-23: qty 1

## 2017-01-23 MED ORDER — ONDANSETRON HCL 4 MG/2ML IJ SOLN: 4.0000 mg | Freq: Four times a day (QID) | INTRAMUSCULAR | Status: DC | PRN

## 2017-01-23 MED ORDER — IOPAMIDOL (ISOVUE-370) INJECTION 76%
INTRAVENOUS | Status: AC
  Administered 2017-01-23: 100 mL
  Filled 2017-01-23: qty 100

## 2017-01-23 MED ORDER — BUPROPION HCL ER (SR) 150 MG PO TB12
150.0000 mg | ORAL_TABLET | Freq: Every day | ORAL | Status: DC
  Administered 2017-01-24: 150 mg via ORAL
  Filled 2017-01-23: qty 1

## 2017-01-23 MED ORDER — MORPHINE SULFATE (PF) 4 MG/ML IV SOLN: 2.0000 mg | INTRAVENOUS | Status: DC | PRN

## 2017-01-23 MED ORDER — FENTANYL CITRATE (PF) 100 MCG/2ML IJ SOLN
25.0000 ug | Freq: Once | INTRAMUSCULAR | Status: AC
  Administered 2017-01-23: 25 ug via INTRAVENOUS
  Filled 2017-01-23: qty 2

## 2017-01-23 MED ORDER — CLONAZEPAM 0.5 MG PO TABS: 0.5000 mg | ORAL_TABLET | Freq: Two times a day (BID) | ORAL | Status: DC | PRN

## 2017-01-23 MED ORDER — ASPIRIN EC 81 MG PO TBEC
81.0000 mg | DELAYED_RELEASE_TABLET | Freq: Every day | ORAL | Status: DC
  Administered 2017-01-24: 81 mg via ORAL
  Filled 2017-01-23: qty 1

## 2017-01-23 MED ORDER — PANTOPRAZOLE SODIUM 40 MG PO TBEC
40.0000 mg | DELAYED_RELEASE_TABLET | Freq: Two times a day (BID) | ORAL | Status: DC
  Administered 2017-01-24 (×2): 40 mg via ORAL
  Filled 2017-01-23 (×2): qty 1

## 2017-01-23 MED ORDER — DILTIAZEM HCL 30 MG PO TABS
30.0000 mg | ORAL_TABLET | Freq: Four times a day (QID) | ORAL | Status: DC
  Administered 2017-01-24 (×2): 30 mg via ORAL
  Filled 2017-01-23 (×2): qty 1

## 2017-01-23 MED ORDER — SIMVASTATIN 20 MG PO TABS
20.0000 mg | ORAL_TABLET | Freq: Every day | ORAL | Status: DC
  Administered 2017-01-24: 20 mg via ORAL
  Filled 2017-01-23: qty 1

## 2017-01-23 MED ORDER — FENTANYL CITRATE (PF) 100 MCG/2ML IJ SOLN
50.0000 ug | Freq: Once | INTRAMUSCULAR | Status: AC
  Administered 2017-01-23: 50 ug via INTRAVENOUS
  Filled 2017-01-23: qty 2

## 2017-01-23 MED ORDER — RANOLAZINE ER 500 MG PO TB12
500.0000 mg | ORAL_TABLET | Freq: Two times a day (BID) | ORAL | Status: DC
  Administered 2017-01-24: 500 mg via ORAL
  Filled 2017-01-23: qty 1

## 2017-01-23 MED ORDER — ONDANSETRON HCL 4 MG/2ML IJ SOLN
4.0000 mg | Freq: Once | INTRAMUSCULAR | Status: AC
  Administered 2017-01-23: 4 mg via INTRAVENOUS
  Filled 2017-01-23: qty 2

## 2017-01-23 MED ORDER — ACETAMINOPHEN 325 MG PO TABS: 650.0000 mg | ORAL_TABLET | ORAL | Status: DC | PRN

## 2017-01-23 MED ORDER — NITROGLYCERIN 0.4 MG SL SUBL
0.4000 mg | SUBLINGUAL_TABLET | SUBLINGUAL | Status: DC | PRN
Start: 2017-01-23 — End: 2017-01-24
  Administered 2017-01-23 (×2): 0.4 mg via SUBLINGUAL
  Filled 2017-01-23: qty 1

## 2017-01-23 MED ORDER — KETOROLAC TROMETHAMINE 30 MG/ML IJ SOLN
30.0000 mg | Freq: Once | INTRAMUSCULAR | Status: AC
  Administered 2017-01-23: 30 mg via INTRAVENOUS
  Filled 2017-01-23: qty 1

## 2017-01-23 MED ORDER — ISOSORBIDE MONONITRATE ER 60 MG PO TB24
90.0000 mg | ORAL_TABLET | Freq: Every day | ORAL | Status: DC
  Administered 2017-01-24: 90 mg via ORAL
  Filled 2017-01-23: qty 1

## 2017-01-23 MED ORDER — FAMOTIDINE 20 MG PO TABS
20.0000 mg | ORAL_TABLET | Freq: Two times a day (BID) | ORAL | Status: DC
  Administered 2017-01-24 (×2): 20 mg via ORAL
  Filled 2017-01-23 (×2): qty 1

## 2017-01-23 NOTE — ED Notes (Signed)
Pt w/ active CP and [facility] states they are unable to accept pt.

## 2017-01-23 NOTE — ED Notes (Signed)
PA at bedside.

## 2017-01-23 NOTE — ED Notes (Signed)
Admitting Provider at bedside. 

## 2017-01-23 NOTE — ED Notes (Signed)
Patient transported to CT 

## 2017-01-23 NOTE — H&P (Signed)
History and Physical    [name] GYI:[id number] DOB: 1946/02/23 DOA: 01/23/2017  PCP: [name], MD   Patient coming from: Home  Chief Complaint: Chest pain and headache  HPI: [name] is a 71 y.o. woman with a history of CAD S/P NSTEMI S/P angioplasty and stent to RCA (last cath December 2017 showed 30-40% in-stent restenosis, 40-50% ostial lesion of the first diagonal, EF 50%), HTN, HLD, and GERD who presents to the ED for evaluation of headache and chest pain.  She has had both since Sunday.  Both headache and chest pain have been intermittent.  Chest pain episodes are 10-15 minutes and characterized by substernal pain associated with shortness of breath, diaphoresis, and light-headedness.  She has had to take 1-2 NTG for relief; she thinks that she has taken 10 SL nitroglycerin this week.  Chest pain has been both exertional and nonexertional.  No nausea or vomiting.  No swelling.  Regarding headache, it was present before she started taking extra nitroglycerin.  No associated fever or vision changes beyond what has been associated with her cataracts.  No focal weakness.   ED Course: EKG is negative for ischemic changes.  CTA of her chest/abdomen/pelvis is negative for acute process.  Troponin negative.  Hospitalist asked to admit.  Review of Systems: As per HPI otherwise 10 systems reviewed and negative.   Past Medical History:  Diagnosis Date  . Anemia   . Anxiety   . Blood transfusion   . Chest pain 02/04/2016  . Coronary artery disease   . Coronary artery spasm, wth continued episodes of chest pain.  09/26/2013  . Coronary vasospasm ([place])   . GERD (gastroesophageal reflux disease)   . Hiatal hernia   . Hypercholesteremia   . Hypertension   . NSTEMI (non-ST elevated myocardial infarction) ([place]) not sure  . NSVT (nonsustained ventricular tachycardia) ([place]) 09/26/2013  . Panic attack     Past Surgical History:  Procedure Laterality Date  . ABDOMINAL  HYSTERECTOMY     PARTIAL HYSTERECTOMY  . breast     right, tumor benign  . BREAST EXCISIONAL BIOPSY    . CARDIAC CATHETERIZATION  2014  . CARDIAC CATHETERIZATION  2012  . CARDIAC CATHETERIZATION N/A 07/22/2016   Procedure: Left Heart Cath and Coronary Angiography;  Surgeon: Belva Crome, MD;  Location: [facility];  Service: Cardiovascular;  Laterality: N/A;  . cardiac stent  2012   to RCA  . ESOPHAGEAL MANOMETRY N/A 10/29/2016   Procedure: ESOPHAGEAL MANOMETRY (EM);  Surgeon: Garlan Fair, MD;  Location: [facility];  Service: Endoscopy;  Laterality: N/A;  . ESOPHAGOGASTRODUODENOSCOPY (EGD) WITH PROPOFOL N/A 10/28/2016   Procedure: ESOPHAGOGASTRODUODENOSCOPY (EGD) WITH PROPOFOL;  Surgeon: Garlan Fair, MD;  Location: [facility];  Service: Endoscopy;  Laterality: N/A;  . KNEE SURGERY  left  . left ear surgery     for bad cut  . LEFT HEART CATHETERIZATION WITH CORONARY ANGIOGRAM N/A 02/22/2013   Procedure: LEFT HEART CATHETERIZATION WITH CORONARY ANGIOGRAM;  Surgeon: [name], MD;  Location: [facility];  Service: Cardiovascular;  Laterality: N/A;  . SHOULDER SURGERY Bilateral    rotator cuff     reports that she quit smoking about a year ago. Her smoking use included Cigarettes. She has a 40.00 pack-year smoking history. She has never used smokeless tobacco. She reports that she drinks about 0.6 oz of alcohol per week . She reports that she does not use drugs.  No Known Allergies  Family History  Problem Relation Age of Onset  . Heart failure Mother   . Stroke Sister   . Heart attack Unknown   Her mother is deceased; she had a history of heart disease.  Prior to Admission medications   Medication Sig Start Date End Date Taking? Authorizing Provider  acetaminophen (TYLENOL) 500 MG tablet Take 500-1,000 mg by mouth every 6 (six) hours as needed for headache (pain).   Yes [provider]  aluminum-magnesium hydroxide-simethicone (MAALOX) 200-200-20  MG/5ML SUSP Take 60 mLs by mouth 3 (three) times daily as needed (indigestion).   Yes [provider]  aspirin EC 81 MG tablet Take 81 mg by mouth daily.   Yes [provider]  buPROPion (WELLBUTRIN SR) 150 MG 12 hr tablet Take 150 mg by mouth daily.   Yes [provider]  calcium-vitamin D (OSCAL WITH D) 500-200 MG-UNIT per tablet Take 1 tablet by mouth daily.    Yes [provider]  citalopram (CELEXA) 20 MG tablet Take 20 mg by mouth daily as needed (depression).    Yes [provider]  clonazePAM (KLONOPIN) 0.5 MG tablet Take 0.5 mg by mouth 2 (two) times daily as needed for anxiety. For anxiety    Yes [provider]  isosorbide mononitrate (IMDUR) 60 MG 24 hr tablet Take 1.5 tablets (90 mg total) by mouth daily. Patient taking differently: Take 90 mg by mouth at bedtime.  08/07/16  Yes Bhagat, Bhavinkumar, PA  nitroGLYCERIN (NITROSTAT) 0.4 MG SL tablet ONE TABLET UNDER TONGUE AS NEEDED FOR CHEST PAIN EVERY 5 MINUTES UP TO 3 DOSES, THEN SEEK MEDICAL ATTENTION 06/10/16  Yes Belva Crome, MD  pantoprazole (PROTONIX) 40 MG tablet Take 1 tablet (40 mg total) by mouth 2 (two) times daily. 04/20/14  Yes Isaiah Serge, NP  ramipril (ALTACE) 5 MG capsule Take 5 mg by mouth daily.   Yes [provider]  RANEXA 500 MG 12 hr tablet TAKE 1 TABLET(500 MG) BY MOUTH TWICE DAILY 12/31/16  Yes Belva Crome, MD  ranitidine (ZANTAC) 150 MG tablet Take 150 mg by mouth 2 (two) times daily.    Yes [provider]  simvastatin (ZOCOR) 20 MG tablet Take 1 tablet (20 mg total) by mouth every evening. Patient taking differently: Take 20 mg by mouth at bedtime.  05/04/14  Yes Richardson Dopp T, [place]    Physical Exam: Vitals:   01/23/17 2115 01/23/17 2130 01/23/17 2215 01/23/17 2245  BP: (!) 101/54 (!) 109/54 105/61 (!) 147/68  Pulse: 62 (!) 59 (!) 58 (!) 56  Resp: 20 19 17 14   Temp:      TempSrc:      SpO2: 95% 94% 96% 95%  Weight:        Height:          Constitutional: NAD, calm, comfortable, talking on her cellphone when I enter the room, NONtoxic appearing Vitals:   01/23/17 2115 01/23/17 2130 01/23/17 2215 01/23/17 2245  BP: (!) 101/54 (!) 109/54 105/61 (!) 147/68  Pulse: 62 (!) 59 (!) 58 (!) 56  Resp: 20 19 17 14   Temp:      TempSrc:      SpO2: 95% 94% 96% 95%  Weight:      Height:       Eyes: PERRL, lids and conjunctivae normal ENMT: Mucous membranes are moist. Posterior pharynx not completely visualized. Normal dentition.  Neck: normal appearance, supple, no masses Respiratory: clear to auscultation bilaterally, no wheezing, no crackles.  Normal respiratory effort. No accessory muscle use.  Cardiovascular: Normal rate, regular rhythm, no murmurs / rubs / gallops. No extremity edema. 2+ pedal pulses.  GI: abdomen is soft and compressible.  No distention.  No tenderness.  Bowel sounds are present. Musculoskeletal:  No joint deformity in upper and lower extremities. Good ROM, no contractures. Normal muscle tone.  Skin: no rashes, warm and dry Neurologic: CN 2-12 grossly intact. Sensation intact, Strength symmetric bilaterally, 5/5.  Psychiatric: Normal judgment and insight. Alert and oriented x 3. Normal mood.     Labs on Admission: I have personally reviewed following labs and imaging studies  CBC:  Recent Labs Lab 01/23/17 1334  WBC 6.2  HGB 13.7  HCT 41.7  MCV 93.3  PLT 825   Basic Metabolic Panel:  Recent Labs Lab 01/23/17 1334  NA 138  K 4.5  CL 104  CO2 26  GLUCOSE 123*  BUN 19  CREATININE 0.98  CALCIUM 9.9   GFR: Estimated Creatinine Clearance: 42.7 mL/min (by C-G formula based on SCr of 0.98 mg/dL).   Radiological Exams on Admission: Dg Chest 2 View  Result Date: 01/23/2017 CLINICAL DATA:  Left-sided chest pain. EXAM: CHEST  2 VIEW COMPARISON:  02/04/2016 FINDINGS: Cardiomediastinal silhouette is normal. Mediastinal contours appear intact. There is no evidence of focal  airspace consolidation, pleural effusion or pneumothorax. Mild bronchiectasis and peribronchial thickening. Osseous structures are without acute abnormality. Cervical spine fusion Soft tissues are grossly normal. IMPRESSION: Mild bronchiectasis and peribronchial thickening, which may be seen with reactive airway disease or bronchitis. Electronically Signed   By: Fidela Salisbury M.D.   On: 01/23/2017 14:23   Ct Angio Chest/abd/pel For Dissection W And/or Wo Contrast  Result Date: 01/23/2017 CLINICAL DATA:  Left-sided chest pain, shortness of breath and dizziness. EXAM: CT ANGIOGRAPHY CHEST, ABDOMEN AND PELVIS TECHNIQUE: Multidetector CT imaging through the chest, abdomen and pelvis was performed using the standard protocol during bolus administration of intravenous contrast. Multiplanar reconstructed images and MIPs were obtained and reviewed to evaluate the vascular anatomy. CONTRAST:  100 cc Isovue 370 COMPARISON:  Chest CT 02/14/2015 FINDINGS: CTA CHEST FINDINGS Cardiovascular: The heart is upper limits of normal in size for age. No pericardial effusion. There is mild tortuosity and minimal atherosclerotic calcifications involving the thoracic aorta. No dissection. Branch vessels are patent. Minimal scattered coronary artery calcifications. The pulmonary arteries are fairly well opacified. No filling defects to suggest pulmonary embolism. Mediastinum/Nodes: No mediastinal or hilar mass or lymphadenopathy. The esophagus is grossly normal. Small hiatal hernia. Lungs/Pleura: No acute pulmonary findings. No infiltrates or effusions. Vague subpleural nodule noted in the right middle lobe on image number 30 measures 5 mm. This is unchanged since 02/14/2015. Musculoskeletal: No significant bony findings. Review of the MIP images confirms the above findings. CTA ABDOMEN AND PELVIS FINDINGS VASCULAR Moderate atherosclerotic calcifications involving the abdominal aorta. No dissection or focal aneurysm. The aortic  branch vessels are patent. Review of the MIP images confirms the above findings. NON-VASCULAR Hepatobiliary: No focal hepatic lesions or intrahepatic biliary dilatation. The gallbladder is normal. No common bile duct dilatation. Pancreas: No mass, inflammation or ductal dilatation. Spleen: Normal size.  No focal lesions. Adrenals/Urinary Tract: The adrenal glands and kidneys are unremarkable. Stomach/Bowel: The stomach, duodenum, small bowel and colon are grossly normal without oral contrast. No inflammatory changes, mass lesions or obstructive findings. The terminal ileum and appendix are normal. Lymphatic: No enlarged mesenteric or retroperitoneal lymph nodes. No pelvic adenopathy. Reproductive: The uterus is surgically absent. Both  ovaries are still present and appear normal. Other: No ascites or abdominal wall hernia. Musculoskeletal: No significant bony findings. Foci of chronic bilateral hip AVN. Review of the MIP images confirms the above findings. IMPRESSION: 1. No thoracic or abdominal aortic dissection or aneurysm. Moderate atherosclerotic calcifications involving the abdominal aorta. 2. No acute or significant findings in the chest. Stable right middle lobe pulmonary nodule. 3. No acute abdominal/pelvic findings, mass lesions or lymphadenopathy. Electronically Signed   By: Marijo Sanes M.D.   On: 01/23/2017 19:31    EKG: Independently reviewed. NSR.  No acute ST segment changes.  Assessment/Plan Principal Problem:   Chest pain Active Problems:   HTN (hypertension)   GERD (gastroesophageal reflux disease)   Coronary artery vasospasm (H[facility])   Coronary artery disease   Headache   S/P angioplasty with stent      Chest pain with a history of CAD, vasospasm --Cardiology consult appreciated --Oral cardizem added to home regimen of Imdur and Ranexa. --Serial troponin --Analgesics as needed  CAD --baby aspirin, statin  HTN --Imdur, Ramipril  Headache, etiology unclear --Trial of  toradol   DVT prophylaxis: Low risk, outpatient status Code Status: FULL Family Communication: Patient alone in the ED at time of admission. Disposition Plan: Expect she will go home at discharge. Consults called: Cardiology Admission status: Observation, telemetry   TIME SPENT: 60 minutes   Eber Jones MD [facility] Pager [phone]  If 7PM-7AM, please contact night-coverage [email or web address] Password [facility]  01/23/2017, 11:27 PM

## 2017-01-23 NOTE — ED Notes (Addendum)
Iv team consult. Pt is a very hard stick. Attempted x2.

## 2017-01-23 NOTE — ED Triage Notes (Signed)
Pt reports left sided chest pain intermittent since Sunday described as a cramp. She also reports SOB, dizziness and headache. She states she has to lay down when she pain comes. She took nitro today at 2pm. Pts cardiologist is Dr. Daneen Schick.

## 2017-01-23 NOTE — Consult Note (Signed)
Patient ID: [name] MRN: [id number], DOB/AGE: 14-May-1946  Admit date: 01/23/2017 Primary Physician: [name], MD Primary Cardiologist: Daneen Schick, MD  CARDIOLOGY CONSULT NOTE   PATIENT PROFILE   71 year old woman with history of CAD s/p PCI to RCA, anxiety, GERD who follows with Dr. Daneen Schick. She has demonstrated over the past 20 years there have cyclical periods of high density chest pain recurrences documented to be related to coronary vasospasm on at least 3 different angiographic studies. Her last left heart cath was in December 2017 that showed nonobstructive disease.   HISTORY OF PRESENT ILLNESS   The patient has experienced increased frequency in chest pain over the past 1-2 weeks. Her pain is episodic. It lasts ~10 minutes. Usually she takes a sublingual nitro and rests, which causes the pain to subside. There are no exacerbating factors. Some times she can exert herself with no chest pain - other times minimal activity will cause chest pain. The chest pain is substernal radiating around her L side to her back. It is accompanied by dyspnea and diaphoresis. She recently saw Dr. Tamala Julian with no changes to her regimen. She is also undergoing GI/esophageal workup for these symptoms. No syncope, she has baseline orthopnea that is unchanged, no weight gain or LE edema  Review of Systems General:  No chills, fever, night sweats or weight changes.  Cardiovascular:  See above Dermatological: No rash, lesions/masses Respiratory: No cough, dyspnea Urologic: No hematuria, dysuria Abdominal:   No nausea, vomiting, diarrhea, bright red blood per rectum, melena, or hematemesis Neurologic:  No visual changes, wkns, changes in mental status. All other systems reviewed and are otherwise negative except as noted above.   MEDICAL, FAMILY, AND SOCIAL HISTORY   Past Medical History:  Diagnosis Date  . Anemia   . Anxiety   . Blood transfusion   . Chest pain 02/04/2016  .  Coronary artery disease   . Coronary artery spasm, wth continued episodes of chest pain.  09/26/2013  . Coronary vasospasm ([place])   . GERD (gastroesophageal reflux disease)   . Hiatal hernia   . Hypercholesteremia   . Hypertension   . NSTEMI (non-ST elevated myocardial infarction) ([place]) not sure  . NSVT (nonsustained ventricular tachycardia) ([place]) 09/26/2013  . Panic attack     Past Surgical History:  Procedure Laterality Date  . ABDOMINAL HYSTERECTOMY     PARTIAL HYSTERECTOMY  . breast     right, tumor benign  . BREAST EXCISIONAL BIOPSY    . CARDIAC CATHETERIZATION  2014  . CARDIAC CATHETERIZATION  2012  . CARDIAC CATHETERIZATION N/A 07/22/2016   Procedure: Left Heart Cath and Coronary Angiography;  Surgeon: Belva Crome, MD;  Location: [facility];  Service: Cardiovascular;  Laterality: N/A;  . cardiac stent  2012   to RCA  . ESOPHAGEAL MANOMETRY N/A 10/29/2016   Procedure: ESOPHAGEAL MANOMETRY (EM);  Surgeon: Garlan Fair, MD;  Location: [facility];  Service: Endoscopy;  Laterality: N/A;  . ESOPHAGOGASTRODUODENOSCOPY (EGD) WITH PROPOFOL N/A 10/28/2016   Procedure: ESOPHAGOGASTRODUODENOSCOPY (EGD) WITH PROPOFOL;  Surgeon: Garlan Fair, MD;  Location: [facility];  Service: Endoscopy;  Laterality: N/A;  . KNEE SURGERY  left  . left ear surgery     for bad cut  . LEFT HEART CATHETERIZATION WITH CORONARY ANGIOGRAM N/A 02/22/2013   Procedure: LEFT HEART CATHETERIZATION WITH CORONARY ANGIOGRAM;  Surgeon: [name], MD;  Location: [facility] CATH LAB;  Service: Cardiovascular;  Laterality: N/A;  .  SHOULDER SURGERY Bilateral    rotator cuff    No Known Allergies Prior to Admission medications   Medication Sig Start Date End Date Taking? Authorizing Provider  acetaminophen (TYLENOL) 500 MG tablet Take 500-1,000 mg by mouth every 6 (six) hours as needed for headache (pain).   Yes [provider]  aluminum-magnesium hydroxide-simethicone (MAALOX) 200-200-20  MG/5ML SUSP Take 60 mLs by mouth 3 (three) times daily as needed (indigestion).   Yes [provider]  aspirin EC 81 MG tablet Take 81 mg by mouth daily.   Yes [provider]  buPROPion (WELLBUTRIN SR) 150 MG 12 hr tablet Take 150 mg by mouth daily.   Yes [provider]  calcium-vitamin D (OSCAL WITH D) 500-200 MG-UNIT per tablet Take 1 tablet by mouth daily.    Yes [provider]  citalopram (CELEXA) 20 MG tablet Take 20 mg by mouth daily as needed (depression).    Yes [provider]  clonazePAM (KLONOPIN) 0.5 MG tablet Take 0.5 mg by mouth 2 (two) times daily as needed for anxiety. For anxiety    Yes [provider]  isosorbide mononitrate (IMDUR) 60 MG 24 hr tablet Take 1.5 tablets (90 mg total) by mouth daily. Patient taking differently: Take 90 mg by mouth at bedtime.  08/07/16  Yes Bhagat, Bhavinkumar, PA  nitroGLYCERIN (NITROSTAT) 0.4 MG SL tablet ONE TABLET UNDER TONGUE AS NEEDED FOR CHEST PAIN EVERY 5 MINUTES UP TO 3 DOSES, THEN SEEK MEDICAL ATTENTION 06/10/16  Yes Belva Crome, MD  pantoprazole (PROTONIX) 40 MG tablet Take 1 tablet (40 mg total) by mouth 2 (two) times daily. 04/20/14  Yes Isaiah Serge, NP  ramipril (ALTACE) 5 MG capsule Take 5 mg by mouth daily.   Yes [provider]  RANEXA 500 MG 12 hr tablet TAKE 1 TABLET(500 MG) BY MOUTH TWICE DAILY 12/31/16  Yes Belva Crome, MD  ranitidine (ZANTAC) 150 MG tablet Take 150 mg by mouth 2 (two) times daily.    Yes [provider]  simvastatin (ZOCOR) 20 MG tablet Take 1 tablet (20 mg total) by mouth every evening. Patient taking differently: Take 20 mg by mouth at bedtime.  05/04/14  Yes Richardson Dopp T, PA-C   Family History  Problem Relation Age of Onset  . Heart failure Mother   . Stroke Sister   . Heart attack Unknown    Social History   Social History  . Marital status: Married    Spouse name: [name]  . Number of children: 0  . Years of  education: N/A   Occupational History  . Not on file.   Social History Main Topics  . Smoking status: Former Smoker    Packs/day: 1.00    Years: 40.00    Types: Cigarettes    Quit date: 01/10/2016  . Smokeless tobacco: Never Used  . Alcohol use 0.6 oz/week    1 Cans of beer per week     Comment: occasional beer  . Drug use: No  . Sexual activity: No   Other Topics Concern  . Not on file   Social History Narrative   Lives with husband. Ambulates independently.      PHYSICAL EXAM  Blood pressure 105/61, pulse (!) 58, temperature 98.1 F (36.7 C), temperature source Oral, resp. rate 17, height 4\' 11"  (1.499 m), weight 61.7 kg (136 lb), SpO2 96 %.  General: Pleasant, NAD Psych: Normal affect. Neuro: Alert and oriented X 3. Moves all extremities spontaneously. HEENT:  Sclera anicteric, noninjected. MMM.  Neck: no bruits. JVP is not elevated. Lungs:  CTABL Heart: RRR no m/r/g Abdomen: Soft, non-tender, non-distended, BS + x 4.  Extremities: No clubbing or cyanosis. Edema: no. DP/PT/Radials 2+ and equal bilaterally.   LABS and STUDIES  Troponin (Point of Care Test)  Recent Labs  01/23/17 2030  TROPIPOC 0.00   No results for input(s): CKTOTAL, CKMB, TROPONINI in the last 72 hours. Lab Results  Component Value Date   WBC 6.2 01/23/2017   HGB 13.7 01/23/2017   HCT 41.7 01/23/2017   MCV 93.3 01/23/2017   PLT 282 01/23/2017    Recent Labs Lab 01/23/17 1334  NA 138  K 4.5  CL 104  CO2 26  BUN 19  CREATININE 0.98  CALCIUM 9.9  GLUCOSE 123*   Lab Results  Component Value Date   CHOL 158 02/05/2016   HDL 73 02/05/2016   LDLCALC 64 02/05/2016   TRIG 103 02/05/2016   Lab Results  Component Value Date   DDIMER 0.29 05/04/2014     Radiology/Studies. Independently Reviewed  CT Chest Angio: 1. No thoracic or abdominal aortic dissection or aneurysm. Moderate atherosclerotic calcifications involving the abdominal aorta. 2. No acute or significant findings in  the chest. Stable right middle lobe pulmonary nodule. 3. No acute abdominal/pelvic findings, mass lesions or Lymphadenopathy.  ECG was independently reviewed. NSR with nonspecific lateral changes  LHC 07/22/2016 The left ventricular ejection fraction is 45-50% by visual estimate. Ost 1st Diag lesion, 40 %stenosed. Prox LAD lesion, 30 %stenosed. Mid RCA lesion, 40 %stenosed. Diffuse 30-40% narrowing in the mid right coronary/in-stent restenosis. Otherwise widely patent coronaries with the exception of a 40-50% ostial diagonal 1. Intense radial/brachial spasm during the case requiring that catheter size be downgraded to 4 [place].   ASSESSMENT and RECOMMENDATIONS   Chronic Intermittent Chest Pain The patient has been suffering from intermittent chest pain for 20 years according to Dr. Tamala Julian. She has no flow-limiting epicardial disease (doeds have a patent stent and mild-moderate CAD). An element of her chest pain may be caused by vasospasm and/or small vessel disease. She is also undergoing workup for esophageal/GI etiologies. - Continue home Imdur 90 mg daily and Ranexa 500 mg BID - Start Diltiazem 30 mg four times daily for possible vasospasm if she tolerates this medication, it can be consolidated to once daily dosing. She has used this medication in the past, but I see no mention of it since ~2012. - Continue home statin and aspirin - No signs of acute coronary syndrome. Do not recommend anticoagulation at this time - Given recent LHC in 07/2016, I would not recommend repeat ischemic evaluation at this time   PAULEY, ERIC D, MD 01/23/2017, 10:52 PM

## 2017-01-23 NOTE — ED Provider Notes (Signed)
[facility] DEPT Provider Note   CSN: [id number] Arrival date & time: 01/23/17  1328     History   Chief Complaint Chief Complaint  Patient presents with  . Chest Pain    HPI [name] is a 71 y.o. female. He presents emergency Department with chief complaint of chest pain. Patient states that she has had worsening symptoms over the past month or so. She has known coronary artery disease and had a cardiac catheterization in December 2017 that showed nonocclusive disease, which was chosen to have medical management. Patient states that daily. She is having to take her Nitrostat several times a day. She is also on Ranexa and Imdur. She states that she gets onset of chest pain that is sharp and heavy. She states that it takes her breath away and she also gets sweaty. It is relieved when she takes a nitroglycerin and Lasix down. She states that she physically is given a pass out if she doesn't lay down. She is also complaining of frequent headaches. She's had over the past 3 months. She was seen in the emergency department a few weeks ago and had a workup that was negative for any significant abnormality including temporal arteritis. Patient continues to have these headaches frequently. She does not feel that they're associated with use of her nitroglycerin. Her chest pain has been worsening over the past 3 days. It has been intermittent. Today she had severe pain that radiated toward her back and left shoulder. She took nitroglycerin, but continues to have pressure on the left side of her chest. She called her PCP told her to come to the emergency department. She did have associated diaphoresis. Denies nausea, vomiting or shortness of breath. HPI  Past Medical History:  Diagnosis Date  . Anemia   . Anxiety   . Blood transfusion   . Chest pain 02/04/2016  . Coronary artery disease   . Coronary artery spasm, wth continued episodes of chest pain.  09/26/2013  . Coronary vasospasm ([place])     . GERD (gastroesophageal reflux disease)   . Hiatal hernia   . Hypercholesteremia   . Hypertension   . NSTEMI (non-ST elevated myocardial infarction) ([place]) not sure  . NSVT (nonsustained ventricular tachycardia) ([place]) 09/26/2013  . Panic attack     Patient Active Problem List   Diagnosis Date Noted  . Variant angina ([place]) 01/24/2017  . Chest pain 01/23/2017  . Headache 01/23/2017  . S/P angioplasty with stent 01/23/2017  . Coronary artery disease 11/24/2014  . Syncope 10/22/2014  . Hyperglycemia 04/20/2014  . H/O hiatal hernia 04/19/2014  . 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 HTN (hypertension) 12/08/2011  . GERD (gastroesophageal reflux disease) 12/08/2011  . Depression 12/08/2011  . Dyslipidemia 12/08/2011    Past Surgical History:  Procedure Laterality Date  . ABDOMINAL HYSTERECTOMY     PARTIAL HYSTERECTOMY  . breast     right, tumor benign  . BREAST EXCISIONAL BIOPSY    . CARDIAC CATHETERIZATION  2014  . CARDIAC CATHETERIZATION  2012  . CARDIAC CATHETERIZATION N/A 07/22/2016   Procedure: Left Heart Cath and Coronary Angiography;  Surgeon: Belva Crome, MD;  Location: [facility];  Service: Cardiovascular;  Laterality: N/A;  . cardiac stent  2012   to RCA  . ESOPHAGEAL MANOMETRY N/A 10/29/2016   Procedure: ESOPHAGEAL MANOMETRY (EM);  Surgeon: Garlan Fair, MD;  Location: [facility];  Service: Endoscopy;  Laterality: N/A;  . ESOPHAGOGASTRODUODENOSCOPY (EGD)  WITH PROPOFOL N/A 10/28/2016   Procedure: ESOPHAGOGASTRODUODENOSCOPY (EGD) WITH PROPOFOL;  Surgeon: Garlan Fair, MD;  Location: [facility];  Service: Endoscopy;  Laterality: N/A;  . KNEE SURGERY  left  . left ear surgery     for bad cut  . LEFT HEART CATHETERIZATION WITH CORONARY ANGIOGRAM N/A 02/22/2013   Procedure: LEFT HEART CATHETERIZATION WITH CORONARY ANGIOGRAM;  Surgeon: [name], MD;  Location: [facility];  Service: Cardiovascular;  Laterality: N/A;  .  SHOULDER SURGERY Bilateral    rotator cuff    OB History    No data available       Home Medications    Prior to Admission medications   Medication Sig Start Date End Date Taking? Authorizing Provider  acetaminophen (TYLENOL) 500 MG tablet Take 500-1,000 mg by mouth every 6 (six) hours as needed for headache (pain).   Yes [provider]  aluminum-magnesium hydroxide-simethicone (MAALOX) 200-200-20 MG/5ML SUSP Take 60 mLs by mouth 3 (three) times daily as needed (indigestion).   Yes [provider]  aspirin EC 81 MG tablet Take 81 mg by mouth daily.   Yes [provider]  buPROPion (WELLBUTRIN SR) 150 MG 12 hr tablet Take 150 mg by mouth daily.   Yes [provider]  calcium-vitamin D (OSCAL WITH D) 500-200 MG-UNIT per tablet Take 1 tablet by mouth daily.    Yes [provider]  citalopram (CELEXA) 20 MG tablet Take 20 mg by mouth daily as needed (depression).    Yes [provider]  clonazePAM (KLONOPIN) 0.5 MG tablet Take 0.5 mg by mouth 2 (two) times daily as needed for anxiety. For anxiety    Yes [provider]  isosorbide mononitrate (IMDUR) 60 MG 24 hr tablet Take 1.5 tablets (90 mg total) by mouth daily. Patient taking differently: Take 90 mg by mouth at bedtime.  08/07/16  Yes Bhagat, Bhavinkumar, PA  nitroGLYCERIN (NITROSTAT) 0.4 MG SL tablet ONE TABLET UNDER TONGUE AS NEEDED FOR CHEST PAIN EVERY 5 MINUTES UP TO 3 DOSES, THEN SEEK MEDICAL ATTENTION 06/10/16  Yes Belva Crome, MD  pantoprazole (PROTONIX) 40 MG tablet Take 1 tablet (40 mg total) by mouth 2 (two) times daily. 04/20/14  Yes Isaiah Serge, NP  RANEXA 500 MG 12 hr tablet TAKE 1 TABLET(500 MG) BY MOUTH TWICE DAILY 12/31/16  Yes Belva Crome, MD  ranitidine (ZANTAC) 150 MG tablet Take 150 mg by mouth 2 (two) times daily.    Yes [provider]  amLODipine (NORVASC) 2.5 MG tablet Take 1 tablet (2.5 mg total) by mouth daily. 01/25/17   Eugenie Filler, MD  rosuvastatin (CRESTOR) 5 MG tablet Take 1 tablet (5 mg total) by mouth daily at 6 PM. 01/24/17   Eugenie Filler, MD  traMADol (ULTRAM) 50 MG tablet Take 1 tablet (50 mg total) by mouth every 6 (six) hours as needed for moderate pain. 01/24/17   Eugenie Filler, MD    Family History Family History  Problem Relation Age of Onset  . Heart failure Mother   . Stroke Sister   . Heart attack Unknown     Social History Social History  Substance Use Topics  . Smoking status: Former Smoker    Packs/day: 1.00    Years: 40.00    Types: Cigarettes    Quit date: 01/10/2016  . Smokeless tobacco: Never Used  . Alcohol use 0.6 oz/week    1 Cans of beer per week  Comment: occasional beer     Allergies   Patient has no known allergies.   Review of Systems Review of Systems Ten systems reviewed and are negative for acute change, except as noted in the HPI.    Physical Exam Updated Vital Signs BP (!) 91/43 (BP Location: Right Arm)   Pulse (!) 59   Temp 98.2 F (36.8 C) (Oral)   Resp 15   Ht 4\' 11"  (1.499 m)   Wt 60.2 kg (132 lb 11.2 oz)   SpO2 98%   BMI 26.80 kg/m   Physical Exam  Constitutional: She is oriented to person, place, and time. She appears well-developed and well-nourished. No distress.  HENT:  Head: Normocephalic and atraumatic.  Mouth/Throat: Oropharynx is clear and moist.  Eyes: Conjunctivae and EOM are normal. Pupils are equal, round, and reactive to light. No scleral icterus.  No horizontal, vertical or rotational nystagmus  Neck: Normal range of motion. Neck supple.  Full active and passive ROM without pain No midline or paraspinal tenderness No nuchal rigidity or meningeal signs  Cardiovascular: Normal rate, regular rhythm and normal heart sounds.  Exam reveals no gallop and no friction rub.   No murmur heard. Left radial pulse is decreased.  Pulmonary/Chest: Effort normal and breath sounds normal. No respiratory distress. She has no  wheezes. She has no rales. She exhibits no tenderness.  Abdominal: Soft. Bowel sounds are normal. She exhibits no distension and no mass. There is no tenderness. There is no rebound and no guarding.  Musculoskeletal: Normal range of motion.  Lymphadenopathy:    She has no cervical adenopathy.  Neurological: She is alert and oriented to person, place, and time. No cranial nerve deficit. She exhibits normal muscle tone. Coordination normal.  Mental Status:  Alert, oriented, thought content appropriate. Speech fluent without evidence of aphasia. Able to follow 2 step commands without difficulty.  Cranial Nerves:  II:  Peripheral visual fields grossly normal, pupils equal, round, reactive to light III,IV, VI: ptosis not present, extra-ocular motions intact bilaterally  V,VII: smile symmetric, facial light touch sensation equal VIII: hearing grossly normal bilaterally  IX,X: midline uvula rise  XI: bilateral shoulder shrug equal and strong XII: midline tongue extension  Motor:  5/5 in upper and lower extremities bilaterally including strong and equal grip strength and dorsiflexion/plantar flexion Sensory: Pinprick and light touch normal in all extremities.  Cerebellar: normal finger-to-nose with bilateral upper extremities Gait: normal gait and balance CV: distal pulses palpable throughout   Skin: Skin is warm and dry. No rash noted. She is not diaphoretic.  Psychiatric: She has a normal mood and affect. Her behavior is normal. Judgment and thought content normal.  Nursing note and vitals reviewed.    ED Treatments / Results  Labs (all labs ordered are listed, but only abnormal results are displayed) Labs Reviewed  BASIC METABOLIC PANEL - Abnormal; Notable for the following:       Result Value   Glucose, Bld 123 (*)    GFR calc non Af Amer 57 (*)    All other components within normal limits  BASIC METABOLIC PANEL - Abnormal; Notable for the following:    Glucose, Bld 154 (*)    GFR  calc non Af Amer 57 (*)    All other components within normal limits  MRSA PCR SCREENING  CBC  TROPONIN I  TROPONIN I  TROPONIN I  CBC WITH DIFFERENTIAL/PLATELET  MAGNESIUM  I-STAT TROPOININ, ED  I-STAT TROPOININ, ED    EKG  EKG Interpretation  Date/Time:  Friday January 23 2017 13:34:07 EDT Ventricular Rate:  64 PR Interval:  156 QRS Duration: 82 QT Interval:  426 QTC Calculation: 439 R Axis:   -9 Text Interpretation:  Normal sinus rhythm Nonspecific T wave abnormality Artifact Abnormal ekg Confirmed by Carmin Muskrat [phone]) on 01/24/2017 9:56:12 PM       Radiology No results found.  Procedures Procedures (including critical care time)  Medications Ordered in ED Medications  fentaNYL (SUBLIMAZE) injection 25 mcg (25 mcg Intravenous Given 01/23/17 1740)  ondansetron (ZOFRAN) injection 4 mg (4 mg Intravenous Given 01/23/17 1740)  iopamidol (ISOVUE-370) 76 % injection (100 mLs  Contrast Given 01/23/17 1859)  fentaNYL (SUBLIMAZE) injection 50 mcg (50 mcg Intravenous Given 01/23/17 2222)  ketorolac (TORADOL) 30 MG/ML injection 30 mg (30 mg Intravenous Given 01/23/17 2338)     Initial Impression / Assessment and Plan / ED Course  I have reviewed the triage vital signs and the nursing notes.  Pertinent labs & imaging results that were available during my care of the patient were reviewed by me and considered in my medical decision making (see chart for details).     Patient here with native coronary artery disease. Her heart score is a 6-7. Her chest x-ray shows no acute abnormalities, EKG is unchanged from previous. Initial troponin is negative. CT in general negative for dissection or aneurysm. I spoke with Dr. Mliss Sax , the cardiology fellow on call, who feels that she may have some microvascular disease. He doubts that she would have significant increase in stenosis of her coronary arteries. So for discharge. Of time, however, feels that it would be prudent to admit her for  observation with serial troponins. He will also consult on the patient, formally and review her chart. He feels there may be some changes in medications that may also benefit and way in.  Final Clinical Impressions(s) / ED Diagnoses   Final diagnoses:  Chest pain, unspecified type    New Prescriptions Discharge Medication List as of 01/24/2017  3:46 PM    START taking these medications   Details  amLODipine (NORVASC) 2.5 MG tablet Take 1 tablet (2.5 mg total) by mouth daily., Starting Sun 01/25/2017, Print    rosuvastatin (CRESTOR) 5 MG tablet Take 1 tablet (5 mg total) by mouth daily at 6 PM., Starting Sat 01/24/2017, Print    traMADol (ULTRAM) 50 MG tablet Take 1 tablet (50 mg total) by mouth every 6 (six) hours as needed for moderate pain., Starting Sat 01/24/2017, Print         Margarita Mail, PA-C 01/28/17 1730    Mesner, Corene Cornea, MD 01/28/17 2039

## 2017-01-24 DIAGNOSIS — E785 Hyperlipidemia, unspecified: Secondary | ICD-10-CM

## 2017-01-24 DIAGNOSIS — I10 Essential (primary) hypertension: Secondary | ICD-10-CM | POA: Diagnosis not present

## 2017-01-24 DIAGNOSIS — K219 Gastro-esophageal reflux disease without esophagitis: Secondary | ICD-10-CM

## 2017-01-24 DIAGNOSIS — I201 Angina pectoris with documented spasm: Secondary | ICD-10-CM | POA: Diagnosis not present

## 2017-01-24 DIAGNOSIS — R51 Headache: Secondary | ICD-10-CM | POA: Diagnosis not present

## 2017-01-24 DIAGNOSIS — R079 Chest pain, unspecified: Secondary | ICD-10-CM | POA: Diagnosis not present

## 2017-01-24 DIAGNOSIS — Z959 Presence of cardiac and vascular implant and graft, unspecified: Secondary | ICD-10-CM

## 2017-01-24 LAB — CBC WITH DIFFERENTIAL/PLATELET
Basophils Absolute: 0 10*3/uL (ref 0.0–0.1)
Basophils Relative: 0 %
Eosinophils Absolute: 0.1 10*3/uL (ref 0.0–0.7)
Eosinophils Relative: 3 %
HCT: 38.8 % (ref 36.0–46.0)
Hemoglobin: 12.8 g/dL (ref 12.0–15.0)
Lymphocytes Relative: 38 %
Lymphs Abs: 1.7 10*3/uL (ref 0.7–4.0)
MCH: 30.8 pg (ref 26.0–34.0)
MCHC: 33 g/dL (ref 30.0–36.0)
MCV: 93.5 fL (ref 78.0–100.0)
Monocytes Absolute: 0.3 10*3/uL (ref 0.1–1.0)
Monocytes Relative: 7 %
Neutro Abs: 2.3 10*3/uL (ref 1.7–7.7)
Neutrophils Relative %: 52 %
Platelets: 238 10*3/uL (ref 150–400)
RBC: 4.15 MIL/uL (ref 3.87–5.11)
RDW: 13.4 % (ref 11.5–15.5)
WBC: 4.5 10*3/uL (ref 4.0–10.5)

## 2017-01-24 LAB — BASIC METABOLIC PANEL
Anion gap: 8 (ref 5–15)
BUN: 12 mg/dL (ref 6–20)
CO2: 26 mmol/L (ref 22–32)
Calcium: 8.9 mg/dL (ref 8.9–10.3)
Chloride: 102 mmol/L (ref 101–111)
Creatinine, Ser: 0.98 mg/dL (ref 0.44–1.00)
GFR calc Af Amer: >60 mL/min (ref >60)
GFR calc non Af Amer: 57 mL/min — ABNORMAL LOW (ref >60)
Glucose, Bld: 154 mg/dL — ABNORMAL HIGH (ref 65–99)
Potassium: 4.1 mmol/L (ref 3.5–5.1)
Sodium: 136 mmol/L (ref 135–145)

## 2017-01-24 LAB — TROPONIN I
Troponin I: <0.03 ng/mL (ref <0.03)
Troponin I: <0.03 ng/mL (ref <0.03)
Troponin I: <0.03 ng/mL (ref <0.03)

## 2017-01-24 LAB — MRSA PCR SCREENING: MRSA by PCR: NEGATIVE

## 2017-01-24 LAB — MAGNESIUM: Magnesium: 2 mg/dL (ref 1.7–2.4)

## 2017-01-24 MED ORDER — AMLODIPINE BESYLATE 2.5 MG PO TABS
2.5000 mg | ORAL_TABLET | Freq: Every day | ORAL | Status: DC
  Administered 2017-01-24: 2.5 mg via ORAL
  Filled 2017-01-24: qty 1

## 2017-01-24 MED ORDER — AMLODIPINE BESYLATE 2.5 MG PO TABS: 2.5000 mg | ORAL_TABLET | Freq: Every day | ORAL | 1 refills | Status: DC

## 2017-01-24 MED ORDER — ROSUVASTATIN CALCIUM 10 MG PO TABS: 5.0000 mg | ORAL_TABLET | Freq: Every day | ORAL | Status: DC

## 2017-01-24 MED ORDER — TRAMADOL HCL 50 MG PO TABS: 50.0000 mg | ORAL_TABLET | Freq: Four times a day (QID) | ORAL | 0 refills | Status: DC | PRN

## 2017-01-24 MED ORDER — ROSUVASTATIN CALCIUM 5 MG PO TABS: 5.0000 mg | ORAL_TABLET | Freq: Every day | ORAL | 1 refills | Status: AC

## 2017-01-24 NOTE — Discharge Summary (Signed)
Physician Discharge Summary  [name] UKG:[id number] DOB: 1945/11/06 DOA: 01/23/2017  PCP: Jillian Low, MD  Admit date: 01/23/2017 Discharge date: 01/24/2017  Time spent: 60 minutes  Recommendations for Outpatient Follow-up:  1. Follow-up with Jillian. Daneen [name] in 2 weeks. 2. Follow-up with Jillian Low, MD in 2 weeks. On follow-up patient's blood pressure need to be reassessed as patient's ACE inhibitor was discontinued due to borderline blood pressure. Patient also need a basic metabolic profile done to follow-up on patient's electrolytes and renal function.   Discharge Diagnoses:  Principal Problem:   Chest pain Active Problems:   Coronary artery vasospasm (HCC)   Variant angina (HCC)   HTN (hypertension)   GERD (gastroesophageal reflux disease)   Depression   Dyslipidemia   Coronary artery disease   Headache   S/P angioplasty with stent   Discharge Condition: Stable and improved  Diet recommendation: Heart healthy  Filed Weights   01/23/17 1337 01/24/17 0152  Weight: 61.7 kg (136 lb) 60.2 kg (132 lb 11.2 oz)    History of present illness:  Per [name] is a 71 y.o. woman with a history of CAD S/P NSTEMI S/P angioplasty and stent to RCA (last cath December 2017 showed 30-40% in-stent restenosis, 40-50% ostial lesion of the first diagonal, EF 50%), HTN, HLD, and GERD who presented to the ED for evaluation of headache and chest pain.  She has had both since Sunday.  Both headache and chest pain had been intermittent.  Chest pain episodes are 10-15 minutes and characterized by substernal pain associated with shortness of breath, diaphoresis, and light-headedness.  She has had to take 1-2 NTG for relief; she thinks that she has taken 10 SL nitroglycerin this week.  Chest pain had been both exertional and nonexertional.  No nausea or vomiting.  No swelling.  Regarding headache, it was present before she started taking extra nitroglycerin.  No associated  fever or vision changes beyond what has been associated with her cataracts.  No focal weakness.   ED Course: EKG is negative for ischemic changes.  CTA of her chest/abdomen/pelvis is negative for acute process.  Troponin negative.  Hospitalist asked to admit.   Hospital Course:  #1 chest pain/probable coronary vasospasm/variant angina Patient presented with chest pain ongoing for approximately 5-6 days with no significant improvement with some associated shortness of breath, diaphoresis and lightheadedness. Patient also with a history of coronary artery disease. Patient was admitted for ACS rule out. Cardiac enzymes cycled were negative 3. EKG done showed normal sinus rhythm with nonspecific T-wave changes. Patient noted to have a left heart catheterization December 2017 with a EF of 45-50%, diffuse 30-40% narrowing in the mid right coronary/in-stent restenosis. Otherwise widely patent coronaries with the exception of a 40-50% ostial diagonal 1. Intense radial/brachial spasm during the case requiring catheter size to be downgraded to 4 [place]. Patient was admitted cardiology consultation was obtained and it was felt that patient had been suffering from intermittent chest pain for the past 20 years according to her cardiologist. It was felt the main element of patient's chest pain likely vasospasm versus small vessel disease. Patient was currently undergoing workup for esophageal/GI etiologies. Patient maintained on home regimen of Imdur and Ranexa. Patient initially started on Cardizem 30 mg 4 times daily for possible vasospasms however subsequently discontinued per cardiology and patient placed instead on Norvasc 2.5 mg daily. Patient's simvastatin was subsequently changed to Crestor 5 mg daily as it was a CYP 3A4 substrate which would interact  with Ranexa. Patient improved clinically did not have any further chest pain with discharged home in stable and improved condition. Patient will follow-up with  her cardiologist 2 weeks post discharge.  #2 hypertension Patient blood pressure noted to be somewhat borderline on day of discharge. Patient's ramipril was discontinued. Patient maintained on Norvasc 2.5 mg daily, Imdur, ramipril. Patient's ramipril was discontinued on discharge due to borderline blood pressure. Outpatient follow-up.  #3 gastroesophageal reflux disease Patient maintained on a PPI.  #4 hyperlipidemia Patient was changed from simvastatin to Lipitor due to interaction with Norvasc.  #5 headaches Place on Ultram as needed.  Procedures:  Chest x-ray 01/23/2017  CT angiogram chest/abdomen/pelvis 01/23/2017  Consultations:  Cardiology: Jillian. Mliss [name] 01/23/2017  Discharge Exam: Vitals:   01/24/17 0831 01/24/17 1233  BP: (!) 105/48 (!) 91/43  Pulse: (!) 59   Resp:    Temp: 98.9 F (37.2 C) 98.2 F (36.8 C)    General: NAD Cardiovascular: RRR Respiratory: CTAB  Discharge Instructions   Discharge Instructions    Diet - [name] sodium heart healthy    Complete by:  As directed    Increase activity slowly    Complete by:  As directed      Current Discharge Medication List    START taking these medications   Details  amLODipine (NORVASC) 2.5 MG tablet Take 1 tablet (2.5 mg total) by mouth daily. Qty: 30 tablet, Refills: 1    rosuvastatin (CRESTOR) 5 MG tablet Take 1 tablet (5 mg total) by mouth daily at 6 PM. Qty: 30 tablet, Refills: 1    traMADol (ULTRAM) 50 MG tablet Take 1 tablet (50 mg total) by mouth every 6 (six) hours as needed for moderate pain. Qty: 15 tablet, Refills: 0      CONTINUE these medications which have NOT CHANGED   Details  acetaminophen (TYLENOL) 500 MG tablet Take 500-1,000 mg by mouth every 6 (six) hours as needed for headache (pain).    aluminum-magnesium hydroxide-simethicone (MAALOX) 782-423-53 MG/5ML SUSP Take 60 mLs by mouth 3 (three) times daily as needed (indigestion).    aspirin EC 81 MG tablet Take 81 mg by mouth  daily.    buPROPion (WELLBUTRIN SR) 150 MG 12 hr tablet Take 150 mg by mouth daily.    calcium-vitamin D (OSCAL WITH D) 500-200 MG-UNIT per tablet Take 1 tablet by mouth daily.     citalopram (CELEXA) 20 MG tablet Take 20 mg by mouth daily as needed (depression).     clonazePAM (KLONOPIN) 0.5 MG tablet Take 0.5 mg by mouth 2 (two) times daily as needed for anxiety. For anxiety     isosorbide mononitrate (IMDUR) 60 MG 24 hr tablet Take 1.5 tablets (90 mg total) by mouth daily. Qty: 45 tablet, Refills: 11    nitroGLYCERIN (NITROSTAT) 0.4 MG SL tablet ONE TABLET UNDER TONGUE AS NEEDED FOR CHEST PAIN EVERY 5 MINUTES UP TO 3 DOSES, THEN SEEK MEDICAL ATTENTION Qty: 25 tablet, Refills: 0    pantoprazole (PROTONIX) 40 MG tablet Take 1 tablet (40 mg total) by mouth 2 (two) times daily. Qty: 60 tablet, Refills: 2    RANEXA 500 MG 12 hr tablet TAKE 1 TABLET(500 MG) BY MOUTH TWICE DAILY Qty: 60 tablet, Refills: 11    ranitidine (ZANTAC) 150 MG tablet Take 150 mg by mouth 2 (two) times daily.       STOP taking these medications     ramipril (ALTACE) 5 MG capsule      simvastatin (ZOCOR) 20 MG tablet  No Known Allergies Follow-up Information    Belva Crome, MD. Schedule an appointment as soon as possible for a visit in 2 week(s).   Specialty:  Cardiology Contact information: [address] [place] [address] [phone]        Jillian Low, MD. Schedule an appointment as soon as possible for a visit in 2 week(s).   Specialty:  Internal Medicine Contact information: [address] [place] [address] [phone]            The results of significant diagnostics from this hospitalization (including imaging, microbiology, ancillary and laboratory) are listed below for reference.    Significant Diagnostic Studies: Dg Chest 2 View  Result Date: 01/23/2017 CLINICAL DATA:  Left-sided chest pain. EXAM: CHEST  2 VIEW COMPARISON:   02/04/2016 FINDINGS: Cardiomediastinal silhouette is normal. Mediastinal contours appear intact. There is no evidence of focal airspace consolidation, pleural effusion or pneumothorax. Mild bronchiectasis and peribronchial thickening. Osseous structures are without acute abnormality. Cervical spine fusion Soft tissues are grossly normal. IMPRESSION: Mild bronchiectasis and peribronchial thickening, which may be seen with reactive airway disease or bronchitis. Electronically Signed   By: Fidela Salisbury M.D.   On: 01/23/2017 14:23   Mm Screening Breast Tomo Bilateral  Result Date: 01/13/2017 CLINICAL DATA:  Screening. EXAM: 2D DIGITAL SCREENING BILATERAL MAMMOGRAM WITH CAD AND ADJUNCT TOMO COMPARISON:  Previous exam(s). ACR Breast Density Category c: The breast tissue is heterogeneously dense, which may obscure small masses. FINDINGS: There are no findings suspicious for malignancy. Images were processed with CAD. IMPRESSION: No mammographic evidence of malignancy. A result letter of this screening mammogram will be mailed directly to the patient. RECOMMENDATION: Screening mammogram in one year. (Code:SM-B-01Y) BI-RADS CATEGORY  1: Negative. Electronically Signed   By: Ammie Ferrier M.D.   On: 01/13/2017 10:12   Ct Angio Chest/abd/pel For Dissection W And/or Wo Contrast  Result Date: 01/23/2017 CLINICAL DATA:  Left-sided chest pain, shortness of breath and dizziness. EXAM: CT ANGIOGRAPHY CHEST, ABDOMEN AND PELVIS TECHNIQUE: Multidetector CT imaging through the chest, abdomen and pelvis was performed using the standard protocol during bolus administration of intravenous contrast. Multiplanar reconstructed images and MIPs were obtained and reviewed to evaluate the vascular anatomy. CONTRAST:  100 cc Isovue 370 COMPARISON:  Chest CT 02/14/2015 FINDINGS: CTA CHEST FINDINGS Cardiovascular: The heart is upper limits of normal in size for age. No pericardial effusion. There is mild tortuosity and minimal  atherosclerotic calcifications involving the thoracic aorta. No dissection. Branch vessels are patent. Minimal scattered coronary artery calcifications. The pulmonary arteries are fairly well opacified. No filling defects to suggest pulmonary embolism. Mediastinum/Nodes: No mediastinal or hilar mass or lymphadenopathy. The esophagus is grossly normal. Small hiatal hernia. Lungs/Pleura: No acute pulmonary findings. No infiltrates or effusions. Vague subpleural nodule noted in the right middle lobe on image number 30 measures 5 mm. This is unchanged since 02/14/2015. Musculoskeletal: No significant bony findings. Review of the MIP images confirms the above findings. CTA ABDOMEN AND PELVIS FINDINGS VASCULAR Moderate atherosclerotic calcifications involving the abdominal aorta. No dissection or focal aneurysm. The aortic branch vessels are patent. Review of the MIP images confirms the above findings. NON-VASCULAR Hepatobiliary: No focal hepatic lesions or intrahepatic biliary dilatation. The gallbladder is normal. No common bile duct dilatation. Pancreas: No mass, inflammation or ductal dilatation. Spleen: Normal size.  No focal lesions. Adrenals/Urinary Tract: The adrenal glands and kidneys are unremarkable. Stomach/Bowel: The stomach, duodenum, small bowel and colon are grossly normal  without oral contrast. No inflammatory changes, mass lesions or obstructive findings. The terminal ileum and appendix are normal. Lymphatic: No enlarged mesenteric or retroperitoneal lymph nodes. No pelvic adenopathy. Reproductive: The uterus is surgically absent. Both ovaries are still present and appear normal. Other: No ascites or abdominal wall hernia. Musculoskeletal: No significant bony findings. Foci of chronic bilateral hip AVN. Review of the MIP images confirms the above findings. IMPRESSION: 1. No thoracic or abdominal aortic dissection or aneurysm. Moderate atherosclerotic calcifications involving the abdominal aorta. 2. No  acute or significant findings in the chest. Stable right middle lobe pulmonary nodule. 3. No acute abdominal/pelvic findings, mass lesions or lymphadenopathy. Electronically Signed   By: Marijo Sanes M.D.   On: 01/23/2017 19:31    Microbiology: Recent Results (from the past 240 hour(s))  MRSA PCR Screening     Status: None   Collection Time: 01/24/17  1:53 AM  Result Value Ref Range Status   MRSA by PCR NEGATIVE NEGATIVE Final    Comment:        The GeneXpert MRSA Assay (FDA approved for NASAL specimens only), is one component of a comprehensive MRSA colonization surveillance program. It is not intended to diagnose MRSA infection nor to guide or monitor treatment for MRSA infections.      Labs: Basic Metabolic Panel:  Recent Labs Lab 01/23/17 1334 01/24/17 0913  NA 138 136  K 4.5 4.1  CL 104 102  CO2 26 26  GLUCOSE 123* 154*  BUN 19 12  CREATININE 0.98 0.98  CALCIUM 9.9 8.9  MG  --  2.0   Liver Function Tests: No results for input(s): AST, ALT, ALKPHOS, BILITOT, PROT, ALBUMIN in the last 168 hours. No results for input(s): LIPASE, AMYLASE in the last 168 hours. No results for input(s): AMMONIA in the last 168 hours. CBC:  Recent Labs Lab 01/23/17 1334 01/24/17 0913  WBC 6.2 4.5  NEUTROABS  --  2.3  HGB 13.7 12.8  HCT 41.7 38.8  MCV 93.3 93.5  PLT 282 238   Cardiac Enzymes:  Recent Labs Lab 01/23/17 2332 01/24/17 0216 01/24/17 0517  TROPONINI <0.03 <0.03 <0.03   BNP: BNP (last 3 results) No results for input(s): BNP in the last 8760 hours.  ProBNP (last 3 results) No results for input(s): PROBNP in the last 8760 hours.  CBG: No results for input(s): GLUCAP in the last 168 hours.     SignedIrine Seal MD.  [facility] 01/24/2017, 1:16 PM

## 2017-01-24 NOTE — Progress Notes (Addendum)
DAILY PROGRESS NOTE   Patient Name: [name] Date of Encounter: 01/24/2017  Hospital Problem List   Principal Problem:   Chest pain Active Problems:   HTN (hypertension)   GERD (gastroesophageal reflux disease)   Coronary artery vasospasm (HCC)   Coronary artery disease   Headache   S/P angioplasty with stent    Chief Complaint   Chest feels better   Subjective   Improved chest pain overnight- started on diltiazem for presumed vasospastic angina. Noted to be bradycardic overnight.  Objective   Vitals:   01/24/17 0100 01/24/17 0115 01/24/17 0152 01/24/17 0831  BP: (!) 104/58 (!) 107/59 119/61 (!) 105/48  Pulse: (!) 57 (!) 56 (!) 51 (!) 59  Resp: '17 15 15   ' Temp:    98.9 F (37.2 C)  TempSrc:   Oral Oral  SpO2: 94% 92% 96% 100%  Weight:   132 lb 11.2 oz (60.2 kg)   Height:   '4\' 11"'  (1.499 m)    No intake or output data in the 24 hours ending 01/24/17 0858 Filed Weights   01/23/17 1337 01/24/17 0152  Weight: 136 lb (61.7 kg) 132 lb 11.2 oz (60.2 kg)    Physical Exam   General appearance: alert and no distress Lungs: clear to auscultation bilaterally Heart: regular bradycarda Extremities: extremities normal, atraumatic, no cyanosis or edema Neurologic: Grossly normal  Inpatient Medications    Scheduled Meds: . aspirin EC  81 mg Oral Daily  . buPROPion  150 mg Oral Daily  . diltiazem  30 mg Oral Q6H  . famotidine  20 mg Oral BID  . isosorbide mononitrate  90 mg Oral Daily  . pantoprazole  40 mg Oral BID  . ramipril  5 mg Oral Daily  . ranolazine  500 mg Oral BID  . simvastatin  20 mg Oral QHS    Continuous Infusions:   PRN Meds: acetaminophen, clonazePAM, morphine injection, nitroGLYCERIN, ondansetron (ZOFRAN) IV   Labs   Results for orders placed or performed during the hospital encounter of 01/23/17 (from the past 48 hour(s))  Basic metabolic panel     Status: Abnormal   Collection Time: 01/23/17  1:34 PM  Result Value Ref Range   Sodium 138 135 - 145 mmol/L   Potassium 4.5 3.5 - 5.1 mmol/L   Chloride 104 101 - 111 mmol/L   CO2 26 22 - 32 mmol/L   Glucose, Bld 123 (H) 65 - 99 mg/dL   BUN 19 6 - 20 mg/dL   Creatinine, Ser 0.98 0.44 - 1.00 mg/dL   Calcium 9.9 8.9 - 10.3 mg/dL   GFR calc non Af Amer 57 (L) >60 mL/min   GFR calc Af Amer >60 >60 mL/min    Comment: (NOTE) The eGFR has been calculated using the CKD EPI equation. This calculation has not been validated in all clinical situations. eGFR's persistently <60 mL/min signify possible Chronic Kidney Disease.    Anion gap 8 5 - 15  CBC     Status: None   Collection Time: 01/23/17  1:34 PM  Result Value Ref Range   WBC 6.2 4.0 - 10.5 K/uL   RBC 4.47 3.87 - 5.11 MIL/uL   Hemoglobin 13.7 12.0 - 15.0 g/dL   HCT 41.7 36.0 - 46.0 %   MCV 93.3 78.0 - 100.0 fL   MCH 30.6 26.0 - 34.0 pg   MCHC 32.9 30.0 - 36.0 g/dL   RDW 13.5 11.5 - 15.5 %   Platelets 282 150 -  400 K/uL  I-stat troponin, ED     Status: None   Collection Time: 01/23/17  2:25 PM  Result Value Ref Range   Troponin i, poc 0.00 0.00 - 0.08 ng/mL   Comment 3            Comment: Due to the release kinetics of cTnI, a negative result within the first hours of the onset of symptoms does not rule out myocardial infarction with certainty. If myocardial infarction is still suspected, repeat the test at appropriate intervals.   I-stat troponin, ED     Status: None   Collection Time: 01/23/17  8:30 PM  Result Value Ref Range   Troponin i, poc 0.00 0.00 - 0.08 ng/mL   Comment 3            Comment: Due to the release kinetics of cTnI, a negative result within the first hours of the onset of symptoms does not rule out myocardial infarction with certainty. If myocardial infarction is still suspected, repeat the test at appropriate intervals.   Troponin I-serum (0, 3, 6 hours)     Status: None   Collection Time: 01/23/17 11:32 PM  Result Value Ref Range   Troponin I <0.03 <0.03 ng/mL  MRSA PCR  Screening     Status: None   Collection Time: 01/24/17  1:53 AM  Result Value Ref Range   MRSA by PCR NEGATIVE NEGATIVE    Comment:        The GeneXpert MRSA Assay (FDA approved for NASAL specimens only), is one component of a comprehensive MRSA colonization surveillance program. It is not intended to diagnose MRSA infection nor to guide or monitor treatment for MRSA infections.   Troponin I-serum (0, 3, 6 hours)     Status: None   Collection Time: 01/24/17  2:16 AM  Result Value Ref Range   Troponin I <0.03 <0.03 ng/mL  Troponin I-serum (0, 3, 6 hours)     Status: None   Collection Time: 01/24/17  5:17 AM  Result Value Ref Range   Troponin I <0.03 <0.03 ng/mL    ECG   None  Telemetry   Sinus brady - Personally Reviewed  Radiology    Dg Chest 2 View  Result Date: 01/23/2017 CLINICAL DATA:  Left-sided chest pain. EXAM: CHEST  2 VIEW COMPARISON:  02/04/2016 FINDINGS: Cardiomediastinal silhouette is normal. Mediastinal contours appear intact. There is no evidence of focal airspace consolidation, pleural effusion or pneumothorax. Mild bronchiectasis and peribronchial thickening. Osseous structures are without acute abnormality. Cervical spine fusion Soft tissues are grossly normal. IMPRESSION: Mild bronchiectasis and peribronchial thickening, which may be seen with reactive airway disease or bronchitis. Electronically Signed   By: Fidela Salisbury M.D.   On: 01/23/2017 14:23   Ct Angio Chest/abd/pel For Dissection W And/or Wo Contrast  Result Date: 01/23/2017 CLINICAL DATA:  Left-sided chest pain, shortness of breath and dizziness. EXAM: CT ANGIOGRAPHY CHEST, ABDOMEN AND PELVIS TECHNIQUE: Multidetector CT imaging through the chest, abdomen and pelvis was performed using the standard protocol during bolus administration of intravenous contrast. Multiplanar reconstructed images and MIPs were obtained and reviewed to evaluate the vascular anatomy. CONTRAST:  100 cc Isovue 370  COMPARISON:  Chest CT 02/14/2015 FINDINGS: CTA CHEST FINDINGS Cardiovascular: The heart is upper limits of normal in size for age. No pericardial effusion. There is mild tortuosity and minimal atherosclerotic calcifications involving the thoracic aorta. No dissection. Branch vessels are patent. Minimal scattered coronary artery calcifications. The pulmonary arteries are fairly well  opacified. No filling defects to suggest pulmonary embolism. Mediastinum/Nodes: No mediastinal or hilar mass or lymphadenopathy. The esophagus is grossly normal. Small hiatal hernia. Lungs/Pleura: No acute pulmonary findings. No infiltrates or effusions. Vague subpleural nodule noted in the right middle lobe on image number 30 measures 5 mm. This is unchanged since 02/14/2015. Musculoskeletal: No significant bony findings. Review of the MIP images confirms the above findings. CTA ABDOMEN AND PELVIS FINDINGS VASCULAR Moderate atherosclerotic calcifications involving the abdominal aorta. No dissection or focal aneurysm. The aortic branch vessels are patent. Review of the MIP images confirms the above findings. NON-VASCULAR Hepatobiliary: No focal hepatic lesions or intrahepatic biliary dilatation. The gallbladder is normal. No common bile duct dilatation. Pancreas: No mass, inflammation or ductal dilatation. Spleen: Normal size.  No focal lesions. Adrenals/Urinary Tract: The adrenal glands and kidneys are unremarkable. Stomach/Bowel: The stomach, duodenum, small bowel and colon are grossly normal without oral contrast. No inflammatory changes, mass lesions or obstructive findings. The terminal ileum and appendix are normal. Lymphatic: No enlarged mesenteric or retroperitoneal lymph nodes. No pelvic adenopathy. Reproductive: The uterus is surgically absent. Both ovaries are still present and appear normal. Other: No ascites or abdominal wall hernia. Musculoskeletal: No significant bony findings. Foci of chronic bilateral hip AVN. Review of  the MIP images confirms the above findings. IMPRESSION: 1. No thoracic or abdominal aortic dissection or aneurysm. Moderate atherosclerotic calcifications involving the abdominal aorta. 2. No acute or significant findings in the chest. Stable right middle lobe pulmonary nodule. 3. No acute abdominal/pelvic findings, mass lesions or lymphadenopathy. Electronically Signed   By: Marijo Sanes M.D.   On: 01/23/2017 19:31    Cardiac Studies   N/A  Assessment   1. Principal Problem: 2.   Chest pain 3. Active Problems: 4.   HTN (hypertension) 5.   GERD (gastroesophageal reflux disease) 6.   Coronary artery vasospasm (HCC) 7.   Coronary artery disease 8.   Headache 9.   S/P angioplasty with stent 10.   Plan   1. Symptoms improved - more consistent with variant angina, possibly esophageal spasm. The dihydropyridine CCB's are better vasodilators than non-dihydropyridine CCB's (such as verapamil). Also, she has had bradycardia (worsened by diltiazem) - will switch to low dose amlodipine. Also, max dose Simvastatin is 20 mg when used with amlodipine or 10 mg with diltiazem - she is on 20 mg currently. Simvastatin is a CYP3A4 substrate - she is also noted to be on Ranexa which is also metabolized this way. Would advise changing simvastatin to rosuvastatin 5 mg daily (CYP2C9 metabolism). No further ischemia work-up is recommended. Ok to d/c home from our standpoint.  Cardiology will sign-off. Call with questions. Follow-up with Dr. Tamala Julian.  Time Spent Directly with Patient:  15 minutes  Length of Stay:  LOS: 0 days   Pixie Casino, MD, [place]  Attending Cardiologist  Direct Dial: [phone]  Fax: [phone]  Website:  [email or web address] 01/24/2017, 8:58 AM

## 2017-02-18 NOTE — Progress Notes (Addendum)
Cardiology Office Note    Date:  02/19/2017   ID:  [name], DOB 27-Aug-1945, MRN [id number]  PCP:  Jillian Low, MD  Cardiologist: Jillian Grooms, MD   Chief Complaint  Patient presents with  . Chest Pain  . Coronary Artery Disease    History of Present Illness:  [name] is a 71 y.o. female who presents for prior obstructive coronary disease with prior stenting, well documented recurrent coronary spasm, recurrent syncope with neurally mediated features, hypertension, hyperlipidemia, tobacco abuse, and gastroesophageal reflux.  Returns today as a transition of care follow-up following a recent hospital stay for chest pain. It was a typical hospitalization for her with chest pain, no enzyme or EKG changes, and eventual resolution of pain on IV nitroglycerin. Since discharge she has done well. No significant recurrence in chest pain.  Past Medical History:  Diagnosis Date  . Anemia   . Anxiety   . Blood transfusion   . Chest pain 02/04/2016  . Coronary artery disease   . Coronary artery spasm, wth continued episodes of chest pain.  09/26/2013  . Coronary vasospasm ([place])   . GERD (gastroesophageal reflux disease)   . Hiatal hernia   . Hypercholesteremia   . Hypertension   . NSTEMI (non-ST elevated myocardial infarction) ([place]) not sure  . NSVT (nonsustained ventricular tachycardia) ([place]) 09/26/2013  . Panic attack     Past Surgical History:  Procedure Laterality Date  . ABDOMINAL HYSTERECTOMY     PARTIAL HYSTERECTOMY  . breast     right, tumor benign  . BREAST EXCISIONAL BIOPSY    . CARDIAC CATHETERIZATION  2014  . CARDIAC CATHETERIZATION  2012  . CARDIAC CATHETERIZATION N/A 07/22/2016   Procedure: Left Heart Cath and Coronary Angiography;  Surgeon: Belva Crome, MD;  Location: [facility];  Service: Cardiovascular;  Laterality: N/A;  . cardiac stent  2012   to RCA  . ESOPHAGEAL MANOMETRY N/A 10/29/2016   Procedure: ESOPHAGEAL MANOMETRY (EM);   Surgeon: Garlan Fair, MD;  [facility];  Service: Endoscopy;  Laterality: N/A;  . ESOPHAGOGASTRODUODENOSCOPY (EGD) WITH PROPOFOL N/A 10/28/2016   Procedure: ESOPHAGOGASTRODUODENOSCOPY (EGD) WITH PROPOFOL;  Surgeon: Garlan Fair, MD;  [facility];  Service: Endoscopy;  Laterality: N/A;  . KNEE SURGERY  left  . left ear surgery     for bad cut  . LEFT HEART CATHETERIZATION WITH CORONARY ANGIOGRAM N/A 02/22/2013   Procedure: LEFT HEART CATHETERIZATION WITH CORONARY ANGIOGRAM;  Surgeon: Jillian Grooms, MD;  Location[facility] C[facility];  Service: Cardiovascular;  Laterality: N/A;  . SHOULDER SURGERY Bilateral    rotator cuff    Current Medications: Outpatient Medications Prior to Visit  Medication Sig Dispense Refill  . acetaminophen (TYLENOL) 500 MG tablet Take 500-1,000 mg by mouth every 6 (six) hours as needed for headache (pain).    Marland Kitchen aluminum-magnesium hydroxide-simethicone (MAALOX) 662-947-65 MG/5ML SUSP Take 60 mLs by mouth 3 (three) times daily as needed (indigestion).    Marland Kitchen amLODipine (NORVASC) 2.5 MG tablet Take 1 tablet (2.5 mg total) by mouth daily. 30 tablet 1  . aspirin EC 81 MG tablet Take 81 mg by mouth daily.    Marland Kitchen buPROPion (WELLBUTRIN SR) 150 MG 12 hr tablet Take 150 mg by mouth daily.    . calcium-vitamin D (OSCAL WITH D) 500-200 MG-UNIT per tablet Take 1 tablet by mouth daily.     . citalopram (CELEXA) 20 MG tablet Take 20 mg by mouth daily  as needed (depression).     . clonazePAM (KLONOPIN) 0.5 MG tablet Take 0.5 mg by mouth 2 (two) times daily as needed for anxiety. For anxiety     . isosorbide mononitrate (IMDUR) 60 MG 24 hr tablet Take 1.5 tablets (90 mg total) by mouth daily. 45 tablet 11  . nitroGLYCERIN (NITROSTAT) 0.4 MG SL tablet ONE TABLET UNDER TONGUE AS NEEDED FOR CHEST PAIN EVERY 5 MINUTES UP TO 3 DOSES, THEN SEEK MEDICAL ATTENTION 25 tablet 0  . pantoprazole (PROTONIX) 40 MG tablet Take 1 tablet (40 mg total) by mouth 2 (two) times daily.  60 tablet 2  . RANEXA 500 MG 12 hr tablet TAKE 1 TABLET(500 MG) BY MOUTH TWICE DAILY 60 tablet 11  . ranitidine (ZANTAC) 150 MG tablet Take 150 mg by mouth 2 (two) times daily.     . rosuvastatin (CRESTOR) 5 MG tablet Take 1 tablet (5 mg total) by mouth daily at 6 PM. 30 tablet 1  . traMADol (ULTRAM) 50 MG tablet Take 1 tablet (50 mg total) by mouth every 6 (six) hours as needed for moderate pain. 15 tablet 0   No facility-administered medications prior to visit.      Allergies:   Patient has no known allergies.   Social History   Social History  . Marital status: Married    Spouse name: [name]  . Number of children: 0  . Years of education: N/A   Social History Main Topics  . Smoking status: Former Smoker    Packs/day: 1.00    Years: 40.00    Types: Cigarettes    Quit date: 01/10/2016  . Smokeless tobacco: Never Used  . Alcohol use 0.6 oz/week    1 Cans of beer per week     Comment: occasional beer  . Drug use: No  . Sexual activity: No   Other Topics Concern  . None   Social History Narrative   Lives with husband. Ambulates independently.     Family History:  The patient's family history includes Heart attack in her unknown relative; Heart failure in her mother; Stroke in her sister.   ROS:   Please see the history of present illness.    Abdominal pain, vision disturbance, shortness of breath, leg pain, excessive sweating, snoring, constipation, back pain, dizziness, headache, and easy bruising.  All other systems reviewed and are negative.   PHYSICAL EXAM:   VS:  BP 118/60   Pulse 78   Ht 4\' 11"  (1.499 m)   Wt 136 lb 3.2 oz (61.8 kg)   BMI 27.51 kg/m    GEN: Well nourished, well developed, in no acute distress  HEENT: normal  Neck: no JVD, carotid bruits, or masses Cardiac: RRR; no murmurs, rubs, or gallops,no edema  Respiratory:  clear to auscultation bilaterally, normal work of breathing GI: soft, nontender, nondistended, + BS MS: no deformity or atrophy    Skin: warm and dry, no rash Neuro:  Alert and Oriented x 3, Strength and sensation are intact Psych: euthymic mood, full affect  Wt Readings from Last 3 Encounters:  02/19/17 136 lb 3.2 oz (61.8 kg)  01/24/17 132 lb 11.2 oz (60.2 kg)  12/26/16 136 lb 1.9 oz (61.7 kg)      Studies/Labs Reviewed:   EKG:  EKG  01/23/2017 EKG was nonischemic during chest pain. There was normal sinus rhythm with no significant ST-T wave abnormality.  Recent Labs: 01/24/2017: BUN 12; Creatinine, Ser 0.98; Hemoglobin 12.8; Magnesium 2.0; Platelets 238; Potassium 4.1;  Sodium 136   Lipid Panel    Component Value Date/Time   CHOL 158 02/05/2016 0413   TRIG 103 02/05/2016 0413   HDL 73 02/05/2016 0413   CHOLHDL 2.2 02/05/2016 0413   VLDL 21 02/05/2016 0413   LDLCALC 64 02/05/2016 0413    Additional studies/ records that were reviewed today include:  Chest x-ray was no acute abnormality.  Recent hospital stay serial blood work did not reveal evidence of infarction.    ASSESSMENT:    1. 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 Coronary artery disease involving native coronary artery of native heart with angina pectoris with documented spasm ([place])   3. Essential hypertension      PLAN:  In order of problems listed above:  1. Varying recurrent episodes of discomfort was slightly increased activity over the past 6 weeks. Doing okay today. No further workup. No significant change in medical regimen. By color amlodipine which we have tried in the past and it would be no better than any of the other therapies that we have used to control her complaints. 2. No evidence of recurrent obstructive disease. Continue risk factor modification including smoking cessation, lipid lowering, blood pressure control, and exercise. 3. Blood pressures are not sunken control. No change in therapy.  Return as previously scheduled. Use nitroglycerin early for recurrent episodes of pain.    Medication Adjustments/Labs and Tests  Ordered: Current medicines are reviewed at length with the patient today.  Concerns regarding medicines are outlined above.  Medication changes, Labs and Tests ordered today are listed in the Patient Instructions below. Patient Instructions  Medication Instructions:  None  Labwork: None  Testing/Procedures: None  Follow-Up: Your physician recommends that you schedule a follow-up appointment in February 2019 as previously planned.  You will get a letter in the mail two months prior.  When you receive that letter, contact our office to get an appointment.    Any Other Special Instructions Will Be Listed Below (If Applicable).     If you need a refill on your cardiac medications before your next appointment, please call your pharmacy.      Signed, Jillian Grooms, MD  02/19/2017 8:28 AM    [address], [place],   [address] Phone: [phone]; Fax: [phone]

## 2017-02-19 ENCOUNTER — Encounter: Payer: Self-pay | Admitting: Interventional Cardiology

## 2017-02-19 ENCOUNTER — Ambulatory Visit (INDEPENDENT_AMBULATORY_CARE_PROVIDER_SITE_OTHER): Payer: Medicare Other | Admitting: Interventional Cardiology

## 2017-02-19 VITALS — BP 118/60 | HR 78 | Ht 59.0 in | Wt 136.2 lb

## 2017-02-19 DIAGNOSIS — I201 Angina pectoris with documented spasm: Secondary | ICD-10-CM | POA: Diagnosis not present

## 2017-02-19 DIAGNOSIS — I25111 Atherosclerotic heart disease of native coronary artery with angina pectoris with documented spasm: Secondary | ICD-10-CM

## 2017-02-19 DIAGNOSIS — I10 Essential (primary) hypertension: Secondary | ICD-10-CM | POA: Diagnosis not present

## 2017-02-19 NOTE — Patient Instructions (Signed)
Medication Instructions:  None  Labwork: None  Testing/Procedures: None  Follow-Up: Your physician recommends that you schedule a follow-up appointment in February 2019 as previously planned.  You will get a letter in the mail two months prior.  When you receive that letter, contact our office to get an appointment.    Any Other Special Instructions Will Be Listed Below (If Applicable).     If you need a refill on your cardiac medications before your next appointment, please call your pharmacy.

## 2017-05-11 ENCOUNTER — Ambulatory Visit (INDEPENDENT_AMBULATORY_CARE_PROVIDER_SITE_OTHER): Payer: Medicare Other

## 2017-05-11 ENCOUNTER — Ambulatory Visit (INDEPENDENT_AMBULATORY_CARE_PROVIDER_SITE_OTHER): Payer: Medicare Other | Admitting: Orthopedic Surgery

## 2017-05-11 ENCOUNTER — Encounter (INDEPENDENT_AMBULATORY_CARE_PROVIDER_SITE_OTHER): Payer: Self-pay | Admitting: Orthopedic Surgery

## 2017-05-11 DIAGNOSIS — G8929 Other chronic pain: Secondary | ICD-10-CM | POA: Diagnosis not present

## 2017-05-11 DIAGNOSIS — M7541 Impingement syndrome of right shoulder: Secondary | ICD-10-CM | POA: Diagnosis not present

## 2017-05-11 DIAGNOSIS — M25511 Pain in right shoulder: Secondary | ICD-10-CM | POA: Diagnosis not present

## 2017-05-11 MED ORDER — LIDOCAINE HCL 1 % IJ SOLN
5.0000 mL | INTRAMUSCULAR | Status: AC | PRN
  Administered 2017-05-11: 5 mL

## 2017-05-11 MED ORDER — METHYLPREDNISOLONE ACETATE 40 MG/ML IJ SUSP
40.0000 mg | INTRAMUSCULAR | Status: AC | PRN
  Administered 2017-05-11: 40 mg via INTRA_ARTICULAR

## 2017-05-11 NOTE — Progress Notes (Signed)
Office Visit Note   Patient: [name]           Date of Birth: 12-27-1945           MRN: [id number] Visit Date: 05/11/2017              Requested by: [name], MD [place] [employer] [address] [place], [place] [address] PCP: [name], MD  Chief Complaint  Patient presents with  . Right Shoulder - Pain      HPI: Patient is a 71 year old woman who presents complaining of right anterior shoulder pain that radiates down to the biceps. Patient is status post anterior cervical discectomy and fusion with Dr. Lucia Gaskins. Previous evaluations with him has not showed any new disc pathology of the shoulder. Patient also complains of some radicular pain into the ulnar nerve distribution of the right hand.  Assessment & Plan: Visit Diagnoses:  1. Chronic right shoulder pain   2. Impingement syndrome of right shoulder     Plan: Patient underwent a subacromial injection for the right shoulder reevaluate in 3 weeks. Discussed that the pain into the ulnar nerve distribution is most likely due to her cervical spine both levels we can improve her shoulder symptoms worse.  Follow-Up Instructions: Return in about 3 weeks (around 06/01/2017).   Ortho Exam  Patient is alert, oriented, no adenopathy, well-dressed, normal affect, normal respiratory effort. Examination patient has no pain to palpation of the cervical spine she has no pain to palpation over thoracic outlet. She has decreased abduction and flexion with only about 110 of abduction and flexion of the right shoulder she is tender to palpation of the biceps tendon she has pain with Neer and Hawkins impingement test pain with drop arm test. She has no focal motor weakness in the right upper extremity she does have subjective pain into the little ring and long finger of the right hand.   Imaging: Xr Shoulder Right  Result Date: 05/11/2017 Three-view radiographs of the left shoulder shows a congruent glenohumeral joint she does  have superior migration of the humeral head within the glenoid.  No images are attached to the encounter.  Labs: Lab Results  Component Value Date   HGBA1C 6.3 (H) 04/19/2014   HGBA1C 6.0 (H) 09/21/2013   HGBA1C  04/18/2009    5.8 (NOTE) The ADA recommends the following therapeutic goal for glycemic control related to Hgb A1c measurement: Goal of therapy: <6.5 Hgb A1c  Reference: American Diabetes Association: Clinical Practice Recommendations 2010, Diabetes Care, 2010, 33: (Suppl  1).   ESRSEDRATE 9 08/29/2007   REPTSTATUS 03/10/2013 FINAL 03/09/2013   CULT NO GROWTH 03/09/2013    Orders:  Orders Placed This Encounter  Procedures  . XR Shoulder Right   No orders of the defined types were placed in this encounter.    Procedures: Large Joint Inj Date/Time: 05/11/2017 8:51 AM Performed by: DUDA, MARCUS V Authorized by: Newt Minion   Consent Given by:  Patient Site marked: the procedure site was marked   Timeout: prior to procedure the correct patient, procedure, and site was verified   Indications:  Pain and diagnostic evaluation Location:  Shoulder Site:  R subacromial bursa Prep: patient was prepped and draped in usual sterile fashion   Needle Size:  22 G Needle Length:  1.5 inches Approach:  Posterior Ultrasound Guidance: No   Fluoroscopic Guidance: No   Arthrogram: No   Medications:  5 mL lidocaine 1 %; 40 mg methylPREDNISolone acetate 40 MG/ML  Aspiration Attempted: No   Patient tolerance:  Patient tolerated the procedure well with no immediate complications    Clinical Data: No additional findings.  ROS:  All other systems negative, except as noted in the HPI. Review of Systems  Objective: Vital Signs: There were no vitals taken for this visit.  Specialty Comments:  No specialty comments available.  PMFS History: Patient Active Problem List   Diagnosis Date Noted  . 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 Chest pain 01/23/2017  . Headache  01/23/2017  . S/P angioplasty with stent 01/23/2017  . Coronary artery disease 11/24/2014  . Syncope 10/22/2014  . Hyperglycemia 04/20/2014  . H/O hiatal hernia 04/19/2014  . 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 Coronary artery vasospasm ([place]) 09/26/2013  . HTN (hypertension) 12/08/2011  . GERD (gastroesophageal reflux disease) 12/08/2011  . Depression 12/08/2011  . Dyslipidemia 12/08/2011   Past Medical History:  Diagnosis Date  . Anemia   . Anxiety   . Blood transfusion   . Chest pain 02/04/2016  . Coronary artery disease   . Coronary artery spasm, wth continued episodes of chest pain.  09/26/2013  . Coronary vasospasm ([place])   . GERD (gastroesophageal reflux disease)   . Hiatal hernia   . Hypercholesteremia   . Hypertension   . NSTEMI (non-ST elevated myocardial infarction) ([place]) not sure  . NSVT (nonsustained ventricular tachycardia) ([place]) 09/26/2013  . Panic attack     Family History  Problem Relation Age of Onset  . Heart failure Mother   . Stroke Sister   . Heart attack Unknown     Past Surgical History:  Procedure Laterality Date  . ABDOMINAL HYSTERECTOMY     PARTIAL HYSTERECTOMY  . breast     right, tumor benign  . BREAST EXCISIONAL BIOPSY    . CARDIAC CATHETERIZATION  2014  . CARDIAC CATHETERIZATION  2012  . CARDIAC CATHETERIZATION N/A 07/22/2016   Procedure: Left Heart Cath and Coronary Angiography;  Surgeon: Belva Crome, MD;  Location: [facility];  Service: Cardiovascular;  Laterality: N/A;  . cardiac stent  2012   to RCA  . ESOPHAGEAL MANOMETRY N/A 10/29/2016   Procedure: ESOPHAGEAL MANOMETRY (EM);  Surgeon: Garlan Fair, MD;  [facility];  Service: Endoscopy;  Laterality: N/A;  . ESOPHAGOGASTRODUODENOSCOPY (EGD) WITH PROPOFOL N/A 10/28/2016   Procedure: ESOPHAGOGASTRODUODENOSCOPY (EGD) WITH PROPOFOL;  Surgeon: Garlan Fair, MD;  [facility];  Service: Endoscopy;  Laterality: N/A;  . KNEE SURGERY  left  . left ear surgery     for bad  cut  . LEFT HEART CATHETERIZATION WITH CORONARY ANGIOGRAM N/A 02/22/2013   Procedure: LEFT HEART CATHETERIZATION WITH CORONARY ANGIOGRAM;  Surgeon: [name], MD;  Location: [facility] CATH LAB;  Service: Cardiovascular;  Laterality: N/A;  . SHOULDER SURGERY Bilateral    rotator cuff   Social History   Occupational History  . Not on file.   Social History Main Topics  . Smoking status: Former Smoker    Packs/day: 1.00    Years: 40.00    Types: Cigarettes    Quit date: 01/10/2016  . Smokeless tobacco: Never Used  . Alcohol use 0.6 oz/week    1 Cans of beer per week     Comment: occasional beer  . Drug use: No  . Sexual activity: No

## 2017-06-08 ENCOUNTER — Ambulatory Visit (INDEPENDENT_AMBULATORY_CARE_PROVIDER_SITE_OTHER): Payer: Medicare Other | Admitting: Orthopedic Surgery

## 2017-06-08 ENCOUNTER — Encounter (INDEPENDENT_AMBULATORY_CARE_PROVIDER_SITE_OTHER): Payer: Self-pay | Admitting: Orthopedic Surgery

## 2017-06-08 DIAGNOSIS — M7541 Impingement syndrome of right shoulder: Secondary | ICD-10-CM

## 2017-06-08 MED ORDER — METHYLPREDNISOLONE ACETATE 40 MG/ML IJ SUSP
40.0000 mg | INTRAMUSCULAR | Status: AC | PRN
  Administered 2017-06-08: 40 mg via INTRA_ARTICULAR

## 2017-06-08 MED ORDER — LIDOCAINE HCL 1 % IJ SOLN
5.0000 mL | INTRAMUSCULAR | Status: AC | PRN
  Administered 2017-06-08: 5 mL

## 2017-06-08 NOTE — Progress Notes (Signed)
Office Visit Note   Patient: [name]           Date of Birth: [birth year]           MRN: [id number] Visit Date: 06/08/2017              Requested by: [name], MD [place] [employer] [address] [place], [place] [address] PCP: [name], MD  Chief Complaint  Patient presents with  . Right Shoulder - Pain, Follow-up      HPI: Patient is a 71 year old woman who is status post previous right shoulder arthroscopy most recently about a month ago status post right shoulder subacromial injection.  She states that this helped about 50% she states she can still feel a pop when raising her arm.  Assessment & Plan: Visit Diagnoses:  1. Impingement syndrome of right shoulder     Plan: Right shoulder was injected from the subacromial space.  Patient will work on internal and external rotation strengthening and scapular stabilization.  Follow-up as needed.  Follow-Up Instructions: Return if symptoms worsen or fail to improve.   Ortho Exam  Patient is alert, oriented, no adenopathy, well-dressed, normal affect, normal respiratory effort. Examination patient has abduction and flexion to 120 degrees the [facility] joint is nontender to palpation she does have pain with Neer and Hawkins impingement test.  The biceps tendon is tender to palpation.  Imaging: No results found. No images are attached to the encounter.  Labs: Lab Results  Component Value Date   HGBA1C 6.3 (H) 04/19/2014   HGBA1C 6.0 (H) 09/21/2013   HGBA1C  04/18/2009    5.8 (NOTE) The ADA recommends the following therapeutic goal for glycemic control related to Hgb A1c measurement: Goal of therapy: <6.5 Hgb A1c  Reference: American Diabetes Association: Clinical Practice Recommendations 2010, Diabetes Care, 2010, 33: (Suppl  1).   ESRSEDRATE 9 08/29/2007   REPTSTATUS 03/10/2013 FINAL 03/09/2013   CULT NO GROWTH 03/09/2013    Orders:  No orders of the defined types were placed in this encounter.  No orders of  the defined types were placed in this encounter.    Procedures: Large Joint Inj Date/Time: 06/08/2017 2:29 PM Performed by: DUDA, MARCUS V Authorized by: Newt Minion   Consent Given by:  Patient Site marked: the procedure site was marked   Timeout: prior to procedure the correct patient, procedure, and site was verified   Indications:  Pain and diagnostic evaluation Location:  Shoulder Site:  R subacromial bursa Prep: patient was prepped and draped in usual sterile fashion   Needle Size:  22 G Needle Length:  1.5 inches Approach:  Posterior Ultrasound Guidance: No   Fluoroscopic Guidance: No   Arthrogram: No   Medications:  5 mL lidocaine 1 %; 40 mg methylPREDNISolone acetate 40 MG/ML Aspiration Attempted: No   Patient tolerance:  Patient tolerated the procedure well with no immediate complications    Clinical Data: No additional findings.  ROS:  All other systems negative, except as noted in the HPI. Review of Systems  Objective: Vital Signs: There were no vitals taken for this visit.  Specialty Comments:  No specialty comments available.  PMFS History: Patient Active Problem List   Diagnosis Date Noted  . 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 Chest pain 01/23/2017  . Headache 01/23/2017  . S/P angioplasty with stent 01/23/2017  . Coronary artery disease 11/24/2014  . Syncope 10/22/2014  . Hyperglycemia 04/20/2014  . H/O hiatal hernia 04/19/2014  . 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 HTN (hypertension) 12/08/2011  . GERD (gastroesophageal reflux disease) 12/08/2011  . Depression 12/08/2011  . Dyslipidemia 12/08/2011   Past Medical History:  Diagnosis Date  . Anemia   . Anxiety   . Blood transfusion   . Chest pain 02/04/2016  . Coronary artery disease   . Coronary artery spasm, wth continued episodes of chest pain.  09/26/2013  . Coronary vasospasm ([place])   . GERD (gastroesophageal reflux disease)   . Hiatal hernia   .  Hypercholesteremia   . Hypertension   . NSTEMI (non-ST elevated myocardial infarction) ([place]) not sure  . NSVT (nonsustained ventricular tachycardia) ([place]) 09/26/2013  . Panic attack     Family History  Problem Relation Age of Onset  . Heart failure Mother   . Stroke Sister   . Heart attack Unknown     Past Surgical History:  Procedure Laterality Date  . ABDOMINAL HYSTERECTOMY     PARTIAL HYSTERECTOMY  . breast     right, tumor benign  . BREAST EXCISIONAL BIOPSY    . CARDIAC CATHETERIZATION  2014  . CARDIAC CATHETERIZATION  2012  . CARDIAC CATHETERIZATION N/A 07/22/2016   Procedure: Left Heart Cath and Coronary Angiography;  Surgeon: Belva Crome, MD;  Location: [facility];  Service: Cardiovascular;  Laterality: N/A;  . cardiac stent  2012   to RCA  . ESOPHAGEAL MANOMETRY N/A 10/29/2016   Procedure: ESOPHAGEAL MANOMETRY (EM);  Surgeon: Garlan Fair, MD;  [facility];  Service: Endoscopy;  Laterality: N/A;  . ESOPHAGOGASTRODUODENOSCOPY (EGD) WITH PROPOFOL N/A 10/28/2016   Procedure: ESOPHAGOGASTRODUODENOSCOPY (EGD) WITH PROPOFOL;  Surgeon: Garlan Fair, MD;  [facility];  Service: Endoscopy;  Laterality: N/A;  . KNEE SURGERY  left  . left ear surgery     for bad cut  . LEFT HEART CATHETERIZATION WITH CORONARY ANGIOGRAM N/A 02/22/2013   Procedure: LEFT HEART CATHETERIZATION WITH CORONARY ANGIOGRAM;  Surgeon: [name], MD;  Location: [facility] CATH LAB;  Service: Cardiovascular;  Laterality: N/A;  . SHOULDER SURGERY Bilateral    rotator cuff   Social History   Occupational History  . Not on file.   Social History Main Topics  . Smoking status: Former Smoker    Packs/day: 1.00    Years: 40.00    Types: Cigarettes    Quit date: 01/10/2016  . Smokeless tobacco: Never Used  . Alcohol use 0.6 oz/week    1 Cans of beer per week     Comment: occasional beer  . Drug use: No  . Sexual activity: No

## 2017-07-28 ENCOUNTER — Encounter (HOSPITAL_COMMUNITY): Payer: Self-pay | Admitting: *Deleted

## 2017-07-28 ENCOUNTER — Other Ambulatory Visit: Payer: Self-pay

## 2017-07-28 NOTE — Progress Notes (Signed)
Mrs [name] denies chest pain at this time.  Patient reports that she was just started on Metformin and she does not have a CBG machine.  I told patient to hold Metformin in am.

## 2017-07-29 ENCOUNTER — Ambulatory Visit (HOSPITAL_COMMUNITY)
Admission: RE | Admit: 2017-07-29 | Discharge: 2017-07-29 | Disposition: A | Discharge status: Home / Self Care | Payer: Medicare Other | Source: Ambulatory Visit | Attending: General Surgery | Admitting: General Surgery

## 2017-07-29 ENCOUNTER — Ambulatory Visit (HOSPITAL_COMMUNITY): Payer: Medicare Other | Admitting: Certified Registered Nurse Anesthetist

## 2017-07-29 ENCOUNTER — Encounter (HOSPITAL_COMMUNITY): Payer: Self-pay | Admitting: *Deleted

## 2017-07-29 ENCOUNTER — Encounter (HOSPITAL_COMMUNITY)
Admission: RE | Disposition: A | Discharge status: Home / Self Care | Payer: Self-pay | Source: Ambulatory Visit | Attending: General Surgery

## 2017-07-29 DIAGNOSIS — F17200 Nicotine dependence, unspecified, uncomplicated: Secondary | ICD-10-CM | POA: Insufficient documentation

## 2017-07-29 DIAGNOSIS — Z79899 Other long term (current) drug therapy: Secondary | ICD-10-CM | POA: Insufficient documentation

## 2017-07-29 DIAGNOSIS — F419 Anxiety disorder, unspecified: Secondary | ICD-10-CM | POA: Insufficient documentation

## 2017-07-29 DIAGNOSIS — K644 Residual hemorrhoidal skin tags: Secondary | ICD-10-CM | POA: Diagnosis not present

## 2017-07-29 DIAGNOSIS — K5900 Constipation, unspecified: Secondary | ICD-10-CM | POA: Diagnosis not present

## 2017-07-29 DIAGNOSIS — K219 Gastro-esophageal reflux disease without esophagitis: Secondary | ICD-10-CM | POA: Insufficient documentation

## 2017-07-29 DIAGNOSIS — F329 Major depressive disorder, single episode, unspecified: Secondary | ICD-10-CM | POA: Insufficient documentation

## 2017-07-29 DIAGNOSIS — E7800 Pure hypercholesterolemia, unspecified: Secondary | ICD-10-CM | POA: Diagnosis not present

## 2017-07-29 DIAGNOSIS — K259 Gastric ulcer, unspecified as acute or chronic, without hemorrhage or perforation: Secondary | ICD-10-CM | POA: Diagnosis not present

## 2017-07-29 DIAGNOSIS — L918 Other hypertrophic disorders of the skin: Secondary | ICD-10-CM | POA: Insufficient documentation

## 2017-07-29 DIAGNOSIS — Z952 Presence of prosthetic heart valve: Secondary | ICD-10-CM | POA: Insufficient documentation

## 2017-07-29 HISTORY — PX: EXCISION OF SKIN TAG: SHX6270

## 2017-07-29 HISTORY — DX: Type 2 diabetes mellitus without complications: E11.9

## 2017-07-29 LAB — BASIC METABOLIC PANEL
Anion gap: 7 (ref 5–15)
BUN: 10 mg/dL (ref 6–20)
CO2: 23 mmol/L (ref 22–32)
Calcium: 9.1 mg/dL (ref 8.9–10.3)
Chloride: 108 mmol/L (ref 101–111)
Creatinine, Ser: 0.72 mg/dL (ref 0.44–1.00)
GFR calc Af Amer: >60 mL/min (ref >60)
GFR calc non Af Amer: >60 mL/min (ref >60)
Glucose, Bld: 116 mg/dL — ABNORMAL HIGH (ref 65–99)
Potassium: 4.1 mmol/L (ref 3.5–5.1)
Sodium: 138 mmol/L (ref 135–145)

## 2017-07-29 LAB — CBC
HCT: 38.5 % (ref 36.0–46.0)
Hemoglobin: 12.7 g/dL (ref 12.0–15.0)
MCH: 30.1 pg (ref 26.0–34.0)
MCHC: 33 g/dL (ref 30.0–36.0)
MCV: 91.2 fL (ref 78.0–100.0)
Platelets: 293 10*3/uL (ref 150–400)
RBC: 4.22 MIL/uL (ref 3.87–5.11)
RDW: 14.2 % (ref 11.5–15.5)
WBC: 5.8 10*3/uL (ref 4.0–10.5)

## 2017-07-29 LAB — HEMOGLOBIN A1C
Hgb A1c MFr Bld: 6.7 % — ABNORMAL HIGH (ref 4.8–5.6)
Mean Plasma Glucose: 145.59 mg/dL

## 2017-07-29 LAB — GLUCOSE, CAPILLARY
Glucose-Capillary: 126 mg/dL — ABNORMAL HIGH (ref 65–99)
Glucose-Capillary: 97 mg/dL (ref 65–99)
Glucose-Capillary: 117 mg/dL — ABNORMAL HIGH (ref 65–99)

## 2017-07-29 SURGERY — EXCISION, SKIN TAG
Anesthesia: General

## 2017-07-29 MED ORDER — SODIUM CHLORIDE 0.9 % IV SOLN: 250.0000 mL | INTRAVENOUS | Status: DC | PRN

## 2017-07-29 MED ORDER — OXYCODONE HCL 5 MG PO TABS: 5.0000 mg | ORAL_TABLET | ORAL | Status: DC | PRN

## 2017-07-29 MED ORDER — PHENYLEPHRINE 40 MCG/ML (10ML) SYRINGE FOR IV PUSH (FOR BLOOD PRESSURE SUPPORT)
PREFILLED_SYRINGE | INTRAVENOUS | Status: AC
  Filled 2017-07-29: qty 10

## 2017-07-29 MED ORDER — OXYCODONE-ACETAMINOPHEN 5-325 MG PO TABS: 1.0000 | ORAL_TABLET | Freq: Four times a day (QID) | ORAL | 0 refills | Status: DC | PRN

## 2017-07-29 MED ORDER — SODIUM CHLORIDE 0.9% FLUSH: 3.0000 mL | Freq: Two times a day (BID) | INTRAVENOUS | Status: DC

## 2017-07-29 MED ORDER — DEXAMETHASONE SODIUM PHOSPHATE 10 MG/ML IJ SOLN
INTRAMUSCULAR | Status: DC | PRN
  Administered 2017-07-29: 4 mg via INTRAVENOUS

## 2017-07-29 MED ORDER — CHLORHEXIDINE GLUCONATE CLOTH 2 % EX PADS: 6.0000 | MEDICATED_PAD | Freq: Once | CUTANEOUS | Status: DC

## 2017-07-29 MED ORDER — BUPIVACAINE LIPOSOME 1.3 % IJ SUSP
20.0000 mL | INTRAMUSCULAR | Status: DC
  Filled 2017-07-29: qty 20

## 2017-07-29 MED ORDER — PROPOFOL 10 MG/ML IV BOLUS
INTRAVENOUS | Status: DC | PRN
  Administered 2017-07-29: 150 mg via INTRAVENOUS

## 2017-07-29 MED ORDER — MEPERIDINE HCL 25 MG/ML IJ SOLN: 6.2500 mg | INTRAMUSCULAR | Status: DC | PRN

## 2017-07-29 MED ORDER — ONDANSETRON HCL 4 MG/2ML IJ SOLN
INTRAMUSCULAR | Status: DC | PRN
  Administered 2017-07-29: 4 mg via INTRAVENOUS

## 2017-07-29 MED ORDER — MIDAZOLAM HCL 2 MG/2ML IJ SOLN
INTRAMUSCULAR | Status: AC
  Filled 2017-07-29: qty 2

## 2017-07-29 MED ORDER — BUPIVACAINE-EPINEPHRINE 0.25% -1:200000 IJ SOLN
INTRAMUSCULAR | Status: DC | PRN
  Administered 2017-07-29: 30 mL

## 2017-07-29 MED ORDER — BUPIVACAINE LIPOSOME 1.3 % IJ SUSP
INTRAMUSCULAR | Status: DC | PRN
  Administered 2017-07-29: 20 mL

## 2017-07-29 MED ORDER — PROMETHAZINE HCL 25 MG/ML IJ SOLN: 6.2500 mg | INTRAMUSCULAR | Status: DC | PRN

## 2017-07-29 MED ORDER — MORPHINE SULFATE (PF) 2 MG/ML IV SOLN: 2.0000 mg | INTRAVENOUS | Status: DC | PRN

## 2017-07-29 MED ORDER — LACTATED RINGERS IV SOLN
INTRAVENOUS | Status: DC
  Administered 2017-07-29 (×2): via INTRAVENOUS

## 2017-07-29 MED ORDER — ONDANSETRON HCL 4 MG/2ML IJ SOLN
INTRAMUSCULAR | Status: AC
  Filled 2017-07-29: qty 2

## 2017-07-29 MED ORDER — LACTATED RINGERS IV SOLN: INTRAVENOUS | Status: DC

## 2017-07-29 MED ORDER — ACETAMINOPHEN 650 MG RE SUPP: 650.0000 mg | RECTAL | Status: DC | PRN

## 2017-07-29 MED ORDER — FENTANYL CITRATE (PF) 250 MCG/5ML IJ SOLN
INTRAMUSCULAR | Status: AC
  Filled 2017-07-29: qty 5

## 2017-07-29 MED ORDER — FENTANYL CITRATE (PF) 100 MCG/2ML IJ SOLN
INTRAMUSCULAR | Status: DC | PRN
  Administered 2017-07-29: 25 ug via INTRAVENOUS
  Administered 2017-07-29: 50 ug via INTRAVENOUS

## 2017-07-29 MED ORDER — ACETAMINOPHEN 325 MG PO TABS: 650.0000 mg | ORAL_TABLET | ORAL | Status: DC | PRN

## 2017-07-29 MED ORDER — PHENYLEPHRINE HCL 10 MG/ML IJ SOLN
INTRAMUSCULAR | Status: DC | PRN
  Administered 2017-07-29: 60 ug via INTRAVENOUS

## 2017-07-29 MED ORDER — LIDOCAINE 2% (20 MG/ML) 5 ML SYRINGE
INTRAMUSCULAR | Status: AC
  Filled 2017-07-29: qty 5

## 2017-07-29 MED ORDER — LIDOCAINE HCL (CARDIAC) 20 MG/ML IV SOLN
INTRAVENOUS | Status: DC | PRN
  Administered 2017-07-29: 50 mg via INTRAVENOUS

## 2017-07-29 MED ORDER — HYDROMORPHONE HCL 1 MG/ML IJ SOLN: 0.2500 mg | INTRAMUSCULAR | Status: DC | PRN

## 2017-07-29 MED ORDER — PROPOFOL 10 MG/ML IV BOLUS
INTRAVENOUS | Status: AC
  Filled 2017-07-29: qty 20

## 2017-07-29 MED ORDER — BUPIVACAINE-EPINEPHRINE (PF) 0.25% -1:200000 IJ SOLN
INTRAMUSCULAR | Status: AC
  Filled 2017-07-29: qty 30

## 2017-07-29 MED ORDER — MIDAZOLAM HCL 2 MG/2ML IJ SOLN
INTRAMUSCULAR | Status: DC | PRN
  Administered 2017-07-29: 2 mg via INTRAVENOUS

## 2017-07-29 MED ORDER — SODIUM CHLORIDE 0.9% FLUSH: 3.0000 mL | INTRAVENOUS | Status: DC | PRN

## 2017-07-29 MED ORDER — DEXAMETHASONE SODIUM PHOSPHATE 10 MG/ML IJ SOLN
INTRAMUSCULAR | Status: AC
  Filled 2017-07-29: qty 1

## 2017-07-29 MED ORDER — EPHEDRINE SULFATE 50 MG/ML IJ SOLN
INTRAMUSCULAR | Status: DC | PRN
  Administered 2017-07-29: 5 mg via INTRAVENOUS
  Administered 2017-07-29: 10 mg via INTRAVENOUS

## 2017-07-29 SURGICAL SUPPLY — 39 items
ADH SKN CLS APL DERMABOND .7 (GAUZE/BANDAGES/DRESSINGS) ×1
CANISTER SUCT 3000ML PPV (MISCELLANEOUS) IMPLANT
COVER SURGICAL LIGHT HANDLE (MISCELLANEOUS) ×2 IMPLANT
DECANTER SPIKE VIAL GLASS SM (MISCELLANEOUS) ×2 IMPLANT
DERMABOND ADVANCED (GAUZE/BANDAGES/DRESSINGS) ×1
DERMABOND ADVANCED .7 DNX12 (GAUZE/BANDAGES/DRESSINGS) ×1 IMPLANT
DRAPE LAPAROSCOPIC ABDOMINAL (DRAPES) IMPLANT
DRAPE LAPAROTOMY 100X72 PEDS (DRAPES) IMPLANT
DRAPE UTILITY XL STRL (DRAPES) ×4 IMPLANT
ELECT CAUTERY BLADE 6.4 (BLADE) ×2 IMPLANT
ELECT REM PT RETURN 9FT ADLT (ELECTROSURGICAL) ×2
ELECTRODE REM PT RTRN 9FT ADLT (ELECTROSURGICAL) ×1 IMPLANT
GAUZE SPONGE 4X4 12PLY STRL (GAUZE/BANDAGES/DRESSINGS) IMPLANT
GAUZE SPONGE 4X4 12PLY STRL LF (GAUZE/BANDAGES/DRESSINGS) ×1 IMPLANT
GLOVE SURG SIGNA 7.5 PF LTX (GLOVE) ×2 IMPLANT
GOWN STRL REUS W/ TWL LRG LVL3 (GOWN DISPOSABLE) ×1 IMPLANT
GOWN STRL REUS W/ TWL XL LVL3 (GOWN DISPOSABLE) ×1 IMPLANT
GOWN STRL REUS W/TWL LRG LVL3 (GOWN DISPOSABLE) ×2
GOWN STRL REUS W/TWL XL LVL3 (GOWN DISPOSABLE) ×2
KIT BASIN OR (CUSTOM PROCEDURE TRAY) ×2 IMPLANT
KIT ROOM TURNOVER OR (KITS) ×2 IMPLANT
NDL HYPO 25GX1X1/2 BEV (NEEDLE) ×1 IMPLANT
NEEDLE HYPO 25GX1X1/2 BEV (NEEDLE) ×2 IMPLANT
NS IRRIG 1000ML POUR BTL (IV SOLUTION) ×2 IMPLANT
PACK SURGICAL SETUP 50X90 (CUSTOM PROCEDURE TRAY) ×2 IMPLANT
PAD ARMBOARD 7.5X6 YLW CONV (MISCELLANEOUS) ×4 IMPLANT
PENCIL BUTTON HOLSTER BLD 10FT (ELECTRODE) ×2 IMPLANT
SHEARS HARMONIC 9CM CVD (BLADE) ×1 IMPLANT
SPECIMEN JAR SMALL (MISCELLANEOUS) ×2 IMPLANT
SPONGE LAP 18X18 X RAY DECT (DISPOSABLE) ×2 IMPLANT
SUT MNCRL AB 4-0 PS2 18 (SUTURE) ×2 IMPLANT
SUT VIC AB 3-0 SH 27 (SUTURE) ×2
SUT VIC AB 3-0 SH 27XBRD (SUTURE) ×1 IMPLANT
SYR BULB 3OZ (MISCELLANEOUS) ×2 IMPLANT
SYR CONTROL 10ML LL (SYRINGE) ×2 IMPLANT
TOWEL OR 17X24 6PK STRL BLUE (TOWEL DISPOSABLE) ×2 IMPLANT
TOWEL OR 17X26 10 PK STRL BLUE (TOWEL DISPOSABLE) ×2 IMPLANT
TUBE CONNECTING 12X1/4 (SUCTIONS) IMPLANT
YANKAUER SUCT BULB TIP NO VENT (SUCTIONS) IMPLANT

## 2017-07-29 NOTE — Transfer of Care (Signed)
Immediate Anesthesia Transfer of Care Note  Patient: [name]  Procedure(s) Performed: EXCISION OF PERIANAL  SKIN TAG (N/A )  Patient Location: PACU  Anesthesia Type:General  Level of Consciousness: awake and alert   Airway & Oxygen Therapy: Patient Spontanous Breathing and Patient connected to nasal cannula oxygen  Post-op Assessment: Report given to RN and Post -op Vital signs reviewed and stable  Post vital signs: Reviewed and stable  Last Vitals:  Vitals:   07/29/17 0813 07/29/17 1126  BP: 138/86 (!) 108/58  Pulse: 78 79  Resp: 18 12  Temp: 36.6 C (!) 36.1 C  SpO2: 100% 100%    Last Pain:  Vitals:   07/29/17 1126  TempSrc:   PainSc: Asleep      Patients Stated Pain Goal: 2 (85/02/77 4128)  Complications: No apparent anesthesia complications

## 2017-07-29 NOTE — Anesthesia Procedure Notes (Signed)
Procedure Name: LMA Insertion Date/Time: 07/29/2017 11:00 AM Performed by: Inda Coke, CRNA Pre-anesthesia Checklist: Patient identified, Emergency Drugs available, Suction available and Patient being monitored Patient Re-evaluated:Patient Re-evaluated prior to induction Oxygen Delivery Method: Circle System Utilized Preoxygenation: Pre-oxygenation with 100% oxygen Induction Type: IV induction Ventilation: Mask ventilation without difficulty LMA: LMA inserted LMA Size: 4.0 Number of attempts: 1 Airway Equipment and Method: Bite block Placement Confirmation: positive ETCO2 and breath sounds checked- equal and bilateral Tube secured with: Tape Dental Injury: Teeth and Oropharynx as per pre-operative assessment

## 2017-07-29 NOTE — Discharge Instructions (Signed)
ANORECTAL SURGERY:  POST OPERATIVE INSTRUCTIONS  ######################################################################  EAT Start with a pureed / full liquid diet After 24 hours, gradually transition to a high fiber diet.    CONTROL PAIN Control pain so you can tolerate bowel movements,  walk, sleep, tolerate sneezing/coughing, and go up/down stairs.   HAVE A BOWEL MOVEMENT DAILY Keep your bowels regular to avoid problems.   Taking a fiber supplement every day to keep bowels soft.   Try a laxative to override constipation. Use an antidairrheal to slow down diarrhea.   Call if not better after 2 tries  WALK Walk an hour a day.  Control your pain to do that.   CALL IF YOU HAVE PROBLEMS/CONCERNS Call if you are still struggling despite following these instructions. Call if you have concerns not answered by these instructions  ######################################################################    1. Take your usually prescribed home medications unless otherwise directed. 2. DIET: Follow a light bland diet the first 24 hours after arrival home, such as soup, liquids, crackers, etc.  Be sure to include lots of fluids daily.  Avoid fast food or heavy meals as your are more likely to get nauseated.  Eat a low fat the next few days after surgery.   3. PAIN CONTROL: a. Pain is best controlled by a usual combination of three different methods TOGETHER: i. Ice/Heat ii. Over the counter pain medication iii. Prescription pain medication b. Expect swelling and discomfort in the anus/rectal area.  Warm water baths (30-60 minutes up to 6 times a day, especially after bowel meovements) will help. Use ice for the first few days to help decrease swelling and bruising, then switch to heat such as warm towels, sitz baths, warm baths, etc to help relax tight/sore spots and speed recovery.  Some people prefer to use ice alone, heat alone, alternating between ice & heat.  Experiment to what works  for you.   c. It is helpful to take an over-the-counter pain medication regularly for the first few weeks.  Choose one of the following that works best for you: i. Naproxen (Aleve, etc)  Two 220mg  tabs twice a day ii. Ibuprofen (Advil, etc) Three 200mg  tabs four times a day (every meal & bedtime) iii. Acetaminophen (Tylenol, etc) 500-650mg  four times a day (every meal & bedtime) d. A  prescription for pain medication (such as oxycodone, hydrocodone, etc) should be given to you upon discharge.  Take your pain medication as prescribed.  i. If you are having problems/concerns with the prescription medicine (does not control pain, nausea, vomiting, rash, itching, etc), please call us [phone] to see if we need to switch you to a different pain medicine that will work better for you and/or control your side effect better. ii. If you need a refill on your pain medication, please contact your pharmacy.  They will contact our office to request authorization. Prescriptions will not be filled after 5 pm or on week-ends.  Use a Sitz Bath 4-8 times a day for relief   [employer] A sitz bath is a warm water bath taken in the sitting position that covers only the hips and buttocks. It may be used for either healing or hygiene purposes. Sitz baths are also used to relieve pain, itching, or muscle spasms. The water may contain medicine. Moist heat will help you heal and relax.  HOME CARE INSTRUCTIONS  Take 3 to 4 sitz baths a day. 1. Fill the bathtub half full with warm water. 2. Sit in the  water and open the drain a little. 3. Turn on the warm water to keep the tub half full. Keep the water running constantly. 4. Soak in the water for 15 to 20 minutes. 5. After the sitz bath, pat the affected area dry first.   4. KEEP YOUR BOWELS REGULAR a. The goal is one bowel movement a day b. Avoid getting constipated.  Between the surgery and the pain medications, it is common to experience some constipation.   Increasing fluid intake and taking a fiber supplement (such as Metamucil, Citrucel, FiberCon, MiraLax, etc) 2-3 times a day regularly will usually help prevent this problem from occurring.  A mild laxative (prune juice, Milk of Magnesia, MiraLax, etc) should be taken according to package directions if there are no bowel movements after 48 hours. c. Watch out for diarrhea.  If you have many loose bowel movements, simplify your diet to bland foods & liquids for a few days.  Stop any stool softeners and decrease your fiber supplement.  Switching to mild anti-diarrheal medications (Kayopectate, Pepto Bismol) can help.  If this worsens or does not improve, please call us.  5. Wound Care  a. Remove your bandages with your first bowel movement, usually the day after surgery.  You may have packing if you had an abscess.  Let any packing or gauze fall come out.   b. Wear an absorbent pad or soft cotton balls in your underwear as needed to catch any drainage and help keep the area  c. Keep the area clean and dry.  Bathe / shower every day.  Keep the area clean by showering / bathing over the incision / wound.   It is okay to soak an open wound to help wash it.  Consider using a squeeze bottle filled with warm water to gently wash the anal area.  Wet wipes or showers / gentle washing after bowel movements is often less traumatic than regular toilet paper. d. Dennis Bast will often notice bleeding with bowel movements.  This should slow down by the end of the first week of surgery.  Sitting on an ice pack can help. e. Expect some drainage.  This should slow down by the end of the first week of surgery, but you will have occasional bleeding or drainage up to a few months after surgery.  Wear an absorbent pad or soft cotton gauze in your underwear until the drainage stops.  6. ACTIVITIES as tolerated:   a. You may resume regular (light) daily activities beginning the next day--such as daily self-care, walking, climbing  stairs--gradually increasing activities as tolerated.  If you can walk 30 minutes without difficulty, it is safe to try more intense activity such as jogging, treadmill, bicycling, low-impact aerobics, swimming, etc. b. Save the most intensive and strenuous activity for last such as sit-ups, heavy lifting, contact sports, etc  Refrain from any heavy lifting or straining until you are off narcotics for pain control.   c. DO NOT PUSH THROUGH PAIN.  Let pain be your guide: If it hurts to do something, don't do it.  Pain is your body warning you to avoid that activity for another week until the pain goes down. d. You may drive when you are no longer taking prescription pain medication, you can comfortably sit for long periods of time, and you can safely maneuver your car and apply brakes. e. Dennis Bast may have sexual intercourse when it is comfortable.  7. FOLLOW UP in our office a. Please call [facility] at [phone] to set up an appointment to see your surgeon in the office for a follow-up appointment approximately 2-3 weeks after your surgery. b. Make sure that you call for this appointment the day you arrive home to ensure a convenient appointment time.  8. IF YOU HAVE DISABILITY OR FAMILY LEAVE FORMS, BRING THEM TO THE OFFICE FOR PROCESSING.  DO NOT GIVE THEM TO YOUR DOCTOR.        WHEN TO CALL us [phone]: 1. Poor pain control 2. Reactions / problems with new medications (rash/itching, nausea, etc)  3. Fever over 101.5 F (38.5 C) 4. Inability to urinate 5. Nausea and/or vomiting 6. Worsening swelling or bruising 7. Continued bleeding from incision. 8. Increased pain, redness, or drainage from the incision  The clinic staff is available to answer your questions during regular business hours (8:30am-5pm).  Please dont hesitate to call and ask to speak to one of our nurses for clinical concerns.   A surgeon from [facility] is always on call at the hospitals   If you have a  medical emergency, go to the nearest emergency room or call 911.    [facility], [address], [place], [place], [place]  [address] ? MAIN: [phone] ? TOLL FREE: [phone] ? FAX [phone] [email or web address]

## 2017-07-29 NOTE — H&P (Signed)
History of Present Illness  The patient is a 71 year old female who presents with hemorrhoids. Patient is a 71 year old female comes back in after previous history of hemorrhoids. Patient states she continues with constipation. She states that she takes an over-the-counter fiber, supplementation help with her constipation. She does take some laxatives.  Patient states she's trouble with a what appears to be skin tag that irritated, cause some pain. She states is been no bleeding. She states her no bleeding since her last hemorrhoid banding.   Past Surgical History  Hysterectomy (not due to cancer) - Partial  Knee Surgery  Left. Shoulder Surgery  Bilateral. Thyroid Surgery  Valve Replacement   Diagnostic Studies History  Colonoscopy  1-5 years ago Mammogram  within last year Pap Smear  1-5 years ago  Allergies  No Known Drug Allergies 09/18/2015 Allergies Reconciled   Medication History  ClonazePAM (0.5MG  Tablet, Oral) Active. Citalopram Hydrobromide (20MG  Tablet, Oral) Active. Isosorbide Mononitrate ER (60MG  Tablet ER 24HR, Oral) Active. Nitrostat (0.4MG  Tab Sublingual, Sublingual) Active. Simvastatin (20MG  Tablet, Oral) Active. RaNITidine HCl (150MG  Tablet, Oral) Active. Ranexa (500MG  Tablet ER 12HR, Oral) Active. Pantoprazole Sodium (40MG  Tablet DR, Oral) Active. Medications Reconciled  Social History Alcohol use  Occasional alcohol use. Caffeine use  Carbonated beverages, Coffee. No drug use  Tobacco use  Current every day smoker.  Family History  Heart Disease  Mother. Hypertension  Mother. Respiratory Condition  Mother. Seizure disorder  Mother. Thyroid problems  Daughter.  Pregnancy / Birth History  Age at menarche  59 years. Age of menopause  79-60 Gravida  3  Other Problems  Anxiety Disorder  Arthritis  Back Pain  Chest pain  Depression  Gastric Ulcer  Gastroesophageal Reflux Disease  Hemorrhoids  High  blood pressure  Hypercholesterolemia  Inguinal Hernia  Myocardial infarction  Seizure Disorder  Thyroid Disease  Vascular Disease     Review of Systems General Present- Appetite Loss, Chills, Fatigue, Night Sweats and Weight Loss. Not Present- Fever and Weight Gain. Skin Present- Dryness. Not Present- Change in Wart/Mole, Hives, Jaundice, New Lesions, Non-Healing Wounds, Rash and Ulcer. HEENT Present- Hearing Loss, Sinus Pain, Visual Disturbances and Wears glasses/contact lenses. Not Present- Earache, Hoarseness, Nose Bleed, Oral Ulcers, Ringing in the Ears, Seasonal Allergies, Sore Throat and Yellow Eyes. Respiratory Present- Difficulty Breathing and Snoring. Not Present- Bloody sputum, Chronic Cough and Wheezing. Breast Not Present- Breast Mass, Breast Pain, Nipple Discharge and Skin Changes. Cardiovascular Present- Chest Pain, Difficulty Breathing Lying Down, Leg Cramps and Shortness of Breath. Not Present- Palpitations, Rapid Heart Rate and Swelling of Extremities. Gastrointestinal Present- Abdominal Pain, Bloating, Change in Bowel Habits, Excessive gas, Gets full quickly at meals, Hemorrhoids and Indigestion. Not Present- Bloody Stool, Chronic diarrhea, Constipation, Difficulty Swallowing, Nausea, Rectal Pain and Vomiting. Female Genitourinary Present- Frequency, Nocturia, Pelvic Pain and Urgency. Not Present- Painful Urination. Musculoskeletal Present- Joint Stiffness, Muscle Pain and Muscle Weakness. Not Present- Back Pain, Joint Pain and Swelling of Extremities. Neurological Present- Fainting, Headaches, Numbness, Seizures, Tingling and Weakness. Not Present- Decreased Memory, Tremor and Trouble walking. Psychiatric Present- Anxiety, Change in Sleep Pattern and Depression. Not Present- Bipolar, Fearful and Frequent crying. Endocrine Present- Hair Changes, Heat Intolerance and Hot flashes. Not Present- Cold Intolerance, Excessive Hunger and New Diabetes. Hematology Present- Easy  Bruising. Not Present- Excessive bleeding, Gland problems, HIV and Persistent Infections.  BP 138/86   Pulse 78   Temp 97.9 F (36.6 C) (Oral)   Resp 18   Ht 4\' 11"  (1.499 m)  Wt 61.7 kg (136 lb)   SpO2 100%   BMI 27.47 kg/m     Physical Exam  The physical exam findings are as follows: Note:Constitutional: No acute distress, conversant, appears stated age  Eyes: Anicteric sclerae, moist conjunctiva, no lid lag  Neck: No thyromegaly, trachea midline, no cervical lymphadenopathy  Lungs: Clear to auscultation biilaterally, normal respiratory effot  Cardiovascular: regular rate & rhythm, no murmurs, no peripheal edema, pedal pulses 2+  GI: Soft, no masses or hepatosplenomegaly, non-tender to palpation  Rectal: She has a small skin tag in the posterior midline, approximately 1 cm in length.  MSK: Normal gait, no clubbing cyanosis, edema  Skin: No rashes, palpation reveals normal skin turgor  Psychiatric: Appropriate judgment and insight, oriented to person, place, and time    Assessment & Plan ( SKIN TAG OF PERIANAL REGION (K64.4) Impression: 71 year old female with perianal skin tag  1. The patient like to proceed to the operating room for excision of skin tag. 2. I discussed with the patient the risks and benefits of the procedure to include but not limited to: Infection, bleeding, damage to structures, patient was understanding and wishes to proceed.

## 2017-07-29 NOTE — Op Note (Signed)
07/29/2017  11:12 AM  PATIENT:  [name]  71 y.o. female  PRE-OPERATIVE DIAGNOSIS:  skin tag perianal region  POST-OPERATIVE DIAGNOSIS:  same  PROCEDURE:  Procedure(s): EXCISION OF PERIANAL  SKIN TAG (N/A)  SURGEON:  Surgeon(s) and Role:    Ralene Ok, MD - Primary  ANESTHESIA:   local and general  EBL:  2 mL   BLOOD ADMINISTERED:none  DRAINS: none   LOCAL MEDICATIONS USED:  BUPIVICAINE  and OTHER Exparil  SPECIMEN:  No Specimen  DISPOSITION OF SPECIMEN:  N/A  COUNTS:  YES  TOURNIQUET:  * No tourniquets in log *  DICTATION: .Dragon Dictation Indication for procedure: Patient is a 71 year old female with a history of a perianal skin tag as well as some irritation and sedentary issues.  Patient was seen in clinic decide to have this electively removed.  Details of procedure: As the patient was consented she was taken back to the operating room and placed in supine position with bilateral SCDs in place.  Patient's placed under general anesthesia.  Patient placed in lithotomy position.  She was prepped and draped sterile fashion.   A timeout wa called and all facts were verified.   At this time the the perianal skin tag was easily grasped.  A harmonic scalpel was used to transect this.  An area of redundant hemorrhoid tissue was seen at the 12 o'clock position.  This was also grasped and ligated using the harmonic scalpel.  The anterior resection area was then oversewn using 2-0 chromic in a standard running, locking fashion.  At this time percent Marcaine was then mixed with Exparel.  This was infiltrated circumferentially around the perianal area.  Patient tolerated the procedure well.  Patient was dressed with ABD pad and mesh panties.  She was taken to recovery in stable condition.  PLAN OF CARE: Discharge to home after PACU  PATIENT DISPOSITION:  PACU - hemodynamically stable.   Delay start of Pharmacological VTE agent (>24hrs) due to surgical  blood loss or risk of bleeding: not applicable

## 2017-07-29 NOTE — Anesthesia Postprocedure Evaluation (Signed)
Anesthesia Post Note  Patient: Esmee Tolosa  Procedure(s) Performed: EXCISION OF PERIANAL  SKIN TAG (N/A )     Patient location during evaluation: PACU Anesthesia Type: General Level of consciousness: awake and alert Pain management: pain level controlled Vital Signs Assessment: post-procedure vital signs reviewed and stable Respiratory status: spontaneous breathing, nonlabored ventilation, respiratory function stable and patient connected to nasal cannula oxygen Cardiovascular status: blood pressure returned to baseline and stable Postop Assessment: no apparent nausea or vomiting Anesthetic complications: no    Last Vitals:  Vitals:   07/29/17 1153 07/29/17 1200  BP:  120/69  Pulse: 69 70  Resp: (!) 21   Temp:    SpO2: 95% 94%    Last Pain:  Vitals:   07/29/17 1153  TempSrc:   PainSc: 0-No pain                 Effie Berkshire

## 2017-07-29 NOTE — Anesthesia Preprocedure Evaluation (Addendum)
Anesthesia Evaluation  Patient identified by MRN, date of birth, ID band Patient awake    Reviewed: Allergy & Precautions, Patient's Chart, lab work & pertinent test results  Airway Mallampati: I  TM Distance: >3 FB Neck ROM: Full    Dental  (+) Teeth Intact, Dental Advisory Given   Pulmonary former smoker,    Pulmonary exam normal        Cardiovascular hypertension, Pt. on medications + angina + CAD and + Past MI  + pacemaker  Rhythm:Regular Rate:Normal     Neuro/Psych  Headaches, PSYCHIATRIC DISORDERS Anxiety Depression  Neuromuscular disease    GI/Hepatic Neg liver ROS, hiatal hernia, GERD  Medicated,  Endo/Other  diabetes, Type 2, Oral Hypoglycemic Agents  Renal/GU negative Renal ROS  negative genitourinary   Musculoskeletal negative musculoskeletal ROS (+)   Abdominal Normal abdominal exam  (+)   Peds  Hematology   Anesthesia Other Findings   Reproductive/Obstetrics                            Anesthesia Physical Anesthesia Plan  ASA: II  Anesthesia Plan: General   Post-op Pain Management:    Induction: Intravenous  PONV Risk Score and Plan: 4 or greater and Ondansetron, Dexamethasone and Midazolam  Airway Management Planned: LMA  Additional Equipment: None  Intra-op Plan:   Post-operative Plan: Extubation in OR  Informed Consent: I have reviewed the patients History and Physical, chart, labs and discussed the procedure including the risks, benefits and alternatives for the proposed anesthesia with the patient or authorized representative who has indicated his/her understanding and acceptance.   Dental advisory given  Plan Discussed with: CRNA  Anesthesia Plan Comments:        Anesthesia Quick Evaluation

## 2017-07-30 ENCOUNTER — Encounter (HOSPITAL_COMMUNITY): Payer: Self-pay | Admitting: General Surgery

## 2017-08-25 ENCOUNTER — Other Ambulatory Visit: Payer: Self-pay | Admitting: Internal Medicine

## 2017-08-25 DIAGNOSIS — R1013 Epigastric pain: Secondary | ICD-10-CM

## 2017-08-28 ENCOUNTER — Ambulatory Visit
Admission: RE | Admit: 2017-08-28 | Discharge: 2017-08-28 | Disposition: A | Discharge status: Home / Self Care | Payer: Medicare Other | Source: Ambulatory Visit | Attending: Internal Medicine | Admitting: Internal Medicine

## 2017-08-28 DIAGNOSIS — R1013 Epigastric pain: Secondary | ICD-10-CM

## 2017-09-02 ENCOUNTER — Other Ambulatory Visit: Payer: Self-pay | Admitting: Interventional Cardiology

## 2017-09-08 ENCOUNTER — Other Ambulatory Visit: Payer: Self-pay | Admitting: Physician Assistant

## 2017-09-21 ENCOUNTER — Ambulatory Visit: Payer: Self-pay | Admitting: General Surgery

## 2017-09-21 NOTE — H&P (View-Only) (Signed)
History of Present Illness Ralene Ok MD; 09/21/2017 11:12 AM) The patient is a 72 year old female who presents for evaluation of gall stones. Patient is a 72 year old female who comes back in with a history of epigastric abdominal pain. Patient states this is been occurring over the last several months. She states that at this point prematurity she eats or drinks causes the discomfort. He states this is associated with nausea and vomiting. Patient, which I reviewed myself, ultrasound which revealed:  FINDINGS: Gallbladder: Tiny 2 mm echogenic focus in the gallbladder wall. This may secondary to adenomyomatosis. Tiny adherent gallstone cannot be excluded. Mild amount of sludge cannot be excluded. Gallbladder wall thickness normal. Negative Murphy sign.  Common bile duct: Diameter: 4.8 mm  Liver: No focal lesion identified. Within normal limits in parenchymal echogenicity. Portal vein is patent on color Doppler imaging with normal direction of blood flow towards the liver.  IVC: No abnormality visualized.  Pancreas: Visualized portion unremarkable.  Spleen: Size appears normal. Echogenic foci may be related calcified granulomas.  Right Kidney: Length: 10.3 cm. Echogenicity within normal limits. No mass or hydronephrosis visualized.  Left Kidney: Length: 9.7 cm. Echogenicity within normal limits. No mass or hydronephrosis visualized.  Abdominal aorta: No aneurysm visualized.  Other findings: None.  IMPRESSION: 1. Tiny 2 mm punctate echogenic focus in the gallbladder wall. This may be related to adenomyomatosis. Tiny adherent 2 mm gallstone cannot be excluded mild gallbladder sludge cannot be excluded. Negative Murphy sign. No biliary distention.  2. No other significant abnormalities identified.   Electronically Signed By: Marcello Moores Register On: 08/28/2017 09:15  Duration: Location: Timing: Severity: Modifying factors:   Allergies Andee Poles Gerrigner,  CMA; 09/21/2017 11:02 AM) No Known Drug Allergies [09/21/2017]: Allergies Reconciled   Medication History Levonne Spiller, CMA; 09/21/2017 11:03 AM) AmLODIPine Besylate (2.5MG  Tablet, Oral) Active. Rosuvastatin Calcium (5MG  Tablet, Oral) Active. MetFORMIN HCl (500MG  Tablet, Oral) Active. BuPROPion HCl ER (Smoking Det) (150MG  Tablet ER 12HR, Oral) Active. ClonazePAM (0.5MG  Tablet, Oral) Active. Citalopram Hydrobromide (20MG  Tablet, Oral) Active. Isosorbide Mononitrate ER (60MG  Tablet ER 24HR, Oral) Active. Nitrostat (0.4MG  Tab Sublingual, Sublingual) Active. Simvastatin (20MG  Tablet, Oral) Active. RaNITidine HCl (150MG  Tablet, Oral) Active. Ranexa (500MG  Tablet ER 12HR, Oral) Active. Pantoprazole Sodium (40MG  Tablet DR, Oral) Active. Medications Reconciled    Review of Systems Ralene Ok, MD; 09/21/2017 11:13 AM) General Present- Feeling well. Not Present- Fever. Respiratory Not Present- Cough and Difficulty Breathing. Cardiovascular Not Present- Chest Pain. Gastrointestinal Present- Abdominal Pain and Nausea. Musculoskeletal Not Present- Myalgia. Neurological Not Present- Weakness. All other systems negative  Vitals Andee Poles Gerrigner CMA; 09/21/2017 11:03 AM) 09/21/2017 11:03 AM Weight: 136.5 lb Height: 59in Body Surface Area: 1.57 m Body Mass Index: 27.57 kg/m  Temp.: 98.20F(Oral)  Pulse: 108 (Regular)  BP: 114/68 (Sitting, Left Arm, Standard)       Physical Exam Ralene Ok MD; 09/21/2017 11:12 AM) The physical exam findings are as follows: Note:Constitutional: No acute distress, conversant, appears stated age  Eyes: Anicteric sclerae, moist conjunctiva, no lid lag  Neck: No thyromegaly, trachea midline, no cervical lymphadenopathy  Lungs: Clear to auscultation biilaterally, normal respiratory effot  Cardiovascular: regular rate & rhythm, no murmurs, no peripheal edema, pedal pulses 2+  GI: Soft, no masses or  hepatosplenomegaly, non-tender to palpation  MSK: Normal gait, no clubbing cyanosis, edema  Skin: No rashes, palpation reveals normal skin turgor  Psychiatric: Appropriate judgment and insight, oriented to person, place, and time    Assessment & Plan Ralene Ok MD; 09/21/2017 11:13  AM) SYMPTOMATIC CHOLELITHIASIS (K80.20) Impression: 72 year old female with symptomatically cholelithiasis 1. We will proceed to the operating room for a laparoscopic cholecystectomy  2. Risks and benefits were discussed with the patient to generally include, but not limited to: infection, bleeding, possible need for post op ERCP, damage to the bile ducts, bile leak, and possible need for further surgery. Alternatives were offered and described. All questions were answered and the patient voiced understanding of the procedure and wishes to proceed at this point with a laparoscopic cholecystectomy

## 2017-09-21 NOTE — H&P (Signed)
History of Present Illness Jillian Ok MD; 09/21/2017 11:12 AM) The patient is a 72 year old female who presents for evaluation of gall stones. Patient is a 72 year old female who comes back in with a history of epigastric abdominal pain. Patient states this is been occurring over the last several months. She states that at this point prematurity she eats or drinks causes the discomfort. He states this is associated with nausea and vomiting. Patient, which I reviewed myself, ultrasound which revealed:  FINDINGS: Gallbladder: Tiny 2 mm echogenic focus in the gallbladder wall. This may secondary to adenomyomatosis. Tiny adherent gallstone cannot be excluded. Mild amount of sludge cannot be excluded. Gallbladder wall thickness normal. Negative Murphy sign.  Common bile duct: Diameter: 4.8 mm  Liver: No focal lesion identified. Within normal limits in parenchymal echogenicity. Portal vein is patent on color Doppler imaging with normal direction of blood flow towards the liver.  IVC: No abnormality visualized.  Pancreas: Visualized portion unremarkable.  Spleen: Size appears normal. Echogenic foci may be related calcified granulomas.  Right Kidney: Length: 10.3 cm. Echogenicity within normal limits. No mass or hydronephrosis visualized.  Left Kidney: Length: 9.7 cm. Echogenicity within normal limits. No mass or hydronephrosis visualized.  Abdominal aorta: No aneurysm visualized.  Other findings: None.  IMPRESSION: 1. Tiny 2 mm punctate echogenic focus in the gallbladder wall. This may be related to adenomyomatosis. Tiny adherent 2 mm gallstone cannot be excluded mild gallbladder sludge cannot be excluded. Negative Murphy sign. No biliary distention.  2. No other significant abnormalities identified.   Electronically Signed By: Marcello Moores Register On: 08/28/2017 09:15  Duration: Location: Timing: Severity: Modifying factors:   Allergies Andee Poles Gerrigner,  CMA; 09/21/2017 11:02 AM) No Known Drug Allergies [09/21/2017]: Allergies Reconciled   Medication History Levonne Spiller, CMA; 09/21/2017 11:03 AM) AmLODIPine Besylate (2.5MG  Tablet, Oral) Active. Rosuvastatin Calcium (5MG  Tablet, Oral) Active. MetFORMIN HCl (500MG  Tablet, Oral) Active. BuPROPion HCl ER (Smoking Det) (150MG  Tablet ER 12HR, Oral) Active. ClonazePAM (0.5MG  Tablet, Oral) Active. Citalopram Hydrobromide (20MG  Tablet, Oral) Active. Isosorbide Mononitrate ER (60MG  Tablet ER 24HR, Oral) Active. Nitrostat (0.4MG  Tab Sublingual, Sublingual) Active. Simvastatin (20MG  Tablet, Oral) Active. RaNITidine HCl (150MG  Tablet, Oral) Active. Ranexa (500MG  Tablet ER 12HR, Oral) Active. Pantoprazole Sodium (40MG  Tablet DR, Oral) Active. Medications Reconciled    Review of Systems Jillian Ok, MD; 09/21/2017 11:13 AM) General Present- Feeling well. Not Present- Fever. Respiratory Not Present- Cough and Difficulty Breathing. Cardiovascular Not Present- Chest Pain. Gastrointestinal Present- Abdominal Pain and Nausea. Musculoskeletal Not Present- Myalgia. Neurological Not Present- Weakness. All other systems negative  Vitals Andee Poles Gerrigner CMA; 09/21/2017 11:03 AM) 09/21/2017 11:03 AM Weight: 136.5 lb Height: 59in Body Surface Area: 1.57 m Body Mass Index: 27.57 kg/m  Temp.: 98.85F(Oral)  Pulse: 108 (Regular)  BP: 114/68 (Sitting, Left Arm, Standard)       Physical Exam Jillian Ok MD; 09/21/2017 11:12 AM) The physical exam findings are as follows: Note:Constitutional: No acute distress, conversant, appears stated age  Eyes: Anicteric sclerae, moist conjunctiva, no lid lag  Neck: No thyromegaly, trachea midline, no cervical lymphadenopathy  Lungs: Clear to auscultation biilaterally, normal respiratory effot  Cardiovascular: regular rate & rhythm, no murmurs, no peripheal edema, pedal pulses 2+  GI: Soft, no masses or  hepatosplenomegaly, non-tender to palpation  MSK: Normal gait, no clubbing cyanosis, edema  Skin: No rashes, palpation reveals normal skin turgor  Psychiatric: Appropriate judgment and insight, oriented to person, place, and time    Assessment & Plan Jillian Ok MD; 09/21/2017 11:13  AM) SYMPTOMATIC CHOLELITHIASIS (K80.20) Impression: 72 year old female with symptomatically cholelithiasis 1. We will proceed to the operating room for a laparoscopic cholecystectomy  2. Risks and benefits were discussed with the patient to generally include, but not limited to: infection, bleeding, possible need for post op ERCP, damage to the bile ducts, bile leak, and possible need for further surgery. Alternatives were offered and described. All questions were answered and the patient voiced understanding of the procedure and wishes to proceed at this point with a laparoscopic cholecystectomy

## 2017-09-30 ENCOUNTER — Encounter (HOSPITAL_COMMUNITY): Payer: Self-pay

## 2017-09-30 NOTE — Pre-Procedure Instructions (Signed)
[facility]  09/30/2017      [place], [place] [address] Phone: [phone] Fax: [phone]  [facility] [address], [address] [place] [address] Phone: [phone] Fax: [phone]    Your procedure is scheduled on 10/02/2017.  Report to [facility] Admitting at [place].M.  Call this number if you have problems the morning of surgery:  [phone]   Remember:  Do not eat food or drink liquids after midnight.   Continue all medications as directed by your physician except follow these medication instructions before surgery below    Take these medicines the morning of surgery with A SIP OF WATER: Acetaminophen (Tylenol) - if needed for pain Amlodipine (Norvasc) Bupropion (Wellbutrin SR) Clonazepam (Klonopin) - if needed for anxiety Isosorbide mononitrate (Imdur) Pantoprazole (Protonix) Ranexa Ranitidine (Zantac)  7 days prior to surgery STOP taking any Aspirin(unless otherwise instructed by your surgeon), Aleve, Naproxen, Ibuprofen, Motrin, Advil, Goody's, BC's, all herbal medications, fish oil, and all vitamins  WHAT DO I DO ABOUT MY DIABETES MEDICATION?  Marland Kitchen Do not take oral diabetes medicines (pills) the morning of surgery.   How to Manage Your Diabetes Before and After Surgery  Why is it important to control my blood sugar before and after surgery? . Improving blood sugar levels before and after surgery helps healing and can limit problems. . A way of improving blood sugar control is eating a healthy diet by: o  Eating less sugar and carbohydrates o  Increasing activity/exercise o  Talking with your doctor about reaching your blood sugar goals . High blood sugars (greater than 180 mg/dL) can raise your risk of infections and slow your recovery, so you will need to focus on controlling your diabetes  during the weeks before surgery. . Make sure that the doctor who takes care of your diabetes knows about your planned surgery including the date and location.  How do I manage my blood sugar before surgery? . Check your blood sugar at least 4 times a day, starting 2 days before surgery, to make sure that the level is not too high or low. o Check your blood sugar the morning of your surgery when you wake up and every 2 hours until you get to the Short Stay unit. . If your blood sugar is less than 70 mg/dL, you will need to treat for low blood sugar: o Do not take insulin. o Treat a low blood sugar (less than 70 mg/dL) with  cup of clear juice (cranberry or apple), 4 glucose tablets, OR glucose gel. Recheck blood sugar in 15 minutes after treatment (to make sure it is greater than 70 mg/dL). If your blood sugar is not greater than 70 mg/dL on recheck, call [phone] o  for further instructions. . Report your blood sugar to the short stay nurse when you get to Short Stay.  . If you are admitted to the hospital after surgery: o Your blood sugar will be checked by the staff and you will probably be given insulin after surgery (instead of oral diabetes medicines) to make sure you have good blood sugar levels. o The goal for blood sugar control after surgery is 80-180 mg/dL.     Do not wear jewelry, make-up or nail polish.  Do not wear lotions, powders, or perfumes, or deodorant.  Men may shave face and neck.  Do not bring valuables to the hospital.  [facility] is not responsible for any belongings or valuables.  Hearing aids, eyeglasses, contacts, dentures, partials,or bridgework may not be worn into surgery.  Leave your suitcase in the car.  After surgery it may be brought to your room.  For patients admitted to the hospital, discharge time will be determined by your treatment team.  Patients discharged the day of surgery will not be allowed to drive home.   Name and phone number of  your driver:    Special instructions:   [facility]- Preparing For Surgery  Before surgery, you can play an important role. Because skin is not sterile, your skin needs to be as free of germs as possible. You can reduce the number of germs on your skin by washing with CHG (chlorahexidine gluconate) Soap before surgery.  CHG is an antiseptic cleaner which kills germs and bonds with the skin to continue killing germs even after washing.  Please do not use if you have an allergy to CHG or antibacterial soaps. If your skin becomes reddened/irritated stop using the CHG.  Do not shave (including legs and underarms) for at least 48 hours prior to first CHG shower. It is OK to shave your face.  Please follow these instructions carefully.   1. Shower the NIGHT BEFORE SURGERY and the MORNING OF SURGERY with CHG.   2. If you chose to wash your hair, wash your hair first as usual with your normal shampoo.  3. After you shampoo, rinse your hair and body thoroughly to remove the shampoo.  4. Use CHG as you would any other liquid soap. You can apply CHG directly to the skin and wash gently with a scrungie or a clean washcloth.   5. Apply the CHG Soap to your body ONLY FROM THE NECK DOWN.  Do not use on open wounds or open sores. Avoid contact with your eyes, ears, mouth and genitals (private parts). Wash Face and genitals (private parts)  with your normal soap.  6. Wash thoroughly, paying special attention to the area where your surgery will be performed.  7. Thoroughly rinse your body with warm water from the neck down.  8. DO NOT shower/wash with your normal soap after using and rinsing off the CHG Soap.  9. Pat yourself dry with a CLEAN TOWEL.  10. Wear CLEAN PAJAMAS to bed the night before surgery, wear comfortable clothes the morning of surgery  11. Place CLEAN SHEETS on your bed the night of your first shower and DO NOT SLEEP WITH PETS.    Day of Surgery: Shower as stated above Do not  apply any deodorants/lotions.  Please wear clean clothes to the hospital/surgery center.      Please read over the following fact sheets that you were given.

## 2017-10-01 ENCOUNTER — Encounter (HOSPITAL_COMMUNITY): Payer: Self-pay

## 2017-10-01 ENCOUNTER — Encounter (HOSPITAL_COMMUNITY)
Admission: RE | Admit: 2017-10-01 | Discharge: 2017-10-01 | Disposition: A | Discharge status: Home / Self Care | Payer: Medicare Other | Source: Ambulatory Visit | Attending: General Surgery | Admitting: General Surgery

## 2017-10-01 ENCOUNTER — Other Ambulatory Visit: Payer: Self-pay

## 2017-10-01 DIAGNOSIS — Z7984 Long term (current) use of oral hypoglycemic drugs: Secondary | ICD-10-CM | POA: Diagnosis not present

## 2017-10-01 DIAGNOSIS — K8020 Calculus of gallbladder without cholecystitis without obstruction: Secondary | ICD-10-CM | POA: Diagnosis present

## 2017-10-01 DIAGNOSIS — F419 Anxiety disorder, unspecified: Secondary | ICD-10-CM | POA: Diagnosis not present

## 2017-10-01 DIAGNOSIS — F17200 Nicotine dependence, unspecified, uncomplicated: Secondary | ICD-10-CM | POA: Diagnosis not present

## 2017-10-01 DIAGNOSIS — E119 Type 2 diabetes mellitus without complications: Secondary | ICD-10-CM | POA: Diagnosis not present

## 2017-10-01 DIAGNOSIS — I2510 Atherosclerotic heart disease of native coronary artery without angina pectoris: Secondary | ICD-10-CM | POA: Diagnosis not present

## 2017-10-01 DIAGNOSIS — F329 Major depressive disorder, single episode, unspecified: Secondary | ICD-10-CM | POA: Diagnosis not present

## 2017-10-01 DIAGNOSIS — I252 Old myocardial infarction: Secondary | ICD-10-CM | POA: Diagnosis not present

## 2017-10-01 DIAGNOSIS — K8010 Calculus of gallbladder with chronic cholecystitis without obstruction: Secondary | ICD-10-CM | POA: Diagnosis not present

## 2017-10-01 DIAGNOSIS — Z79899 Other long term (current) drug therapy: Secondary | ICD-10-CM | POA: Diagnosis not present

## 2017-10-01 DIAGNOSIS — K219 Gastro-esophageal reflux disease without esophagitis: Secondary | ICD-10-CM | POA: Diagnosis not present

## 2017-10-01 DIAGNOSIS — I10 Essential (primary) hypertension: Secondary | ICD-10-CM | POA: Diagnosis not present

## 2017-10-01 DIAGNOSIS — M1990 Unspecified osteoarthritis, unspecified site: Secondary | ICD-10-CM | POA: Diagnosis not present

## 2017-10-01 HISTORY — DX: Headache, unspecified: R51.9

## 2017-10-01 HISTORY — DX: Angina pectoris, unspecified: I20.9

## 2017-10-01 HISTORY — DX: Headache: R51

## 2017-10-01 HISTORY — DX: Unspecified osteoarthritis, unspecified site: M19.90

## 2017-10-01 LAB — CBC
HCT: 40.4 % (ref 36.0–46.0)
Hemoglobin: 13.4 g/dL (ref 12.0–15.0)
MCH: 29.6 pg (ref 26.0–34.0)
MCHC: 33.2 g/dL (ref 30.0–36.0)
MCV: 89.4 fL (ref 78.0–100.0)
Platelets: 294 10*3/uL (ref 150–400)
RBC: 4.52 MIL/uL (ref 3.87–5.11)
RDW: 13.9 % (ref 11.5–15.5)
WBC: 5.3 10*3/uL (ref 4.0–10.5)

## 2017-10-01 LAB — BASIC METABOLIC PANEL
Anion gap: 11 (ref 5–15)
BUN: 8 mg/dL (ref 6–20)
CO2: 23 mmol/L (ref 22–32)
Calcium: 9.7 mg/dL (ref 8.9–10.3)
Chloride: 105 mmol/L (ref 101–111)
Creatinine, Ser: 0.74 mg/dL (ref 0.44–1.00)
GFR calc Af Amer: >60 mL/min (ref >60)
GFR calc non Af Amer: >60 mL/min (ref >60)
Glucose, Bld: 117 mg/dL — ABNORMAL HIGH (ref 65–99)
Potassium: 3.9 mmol/L (ref 3.5–5.1)
Sodium: 139 mmol/L (ref 135–145)

## 2017-10-01 LAB — HEMOGLOBIN A1C
Hgb A1c MFr Bld: 6.4 % — ABNORMAL HIGH (ref 4.8–5.6)
Mean Plasma Glucose: 136.98 mg/dL

## 2017-10-01 LAB — GLUCOSE, CAPILLARY: Glucose-Capillary: 101 mg/dL — ABNORMAL HIGH (ref 65–99)

## 2017-10-02 ENCOUNTER — Ambulatory Visit (HOSPITAL_COMMUNITY)
Admission: RE | Admit: 2017-10-02 | Discharge: 2017-10-02 | Disposition: A | Discharge status: Home / Self Care | Payer: Medicare Other | Source: Ambulatory Visit | Attending: General Surgery | Admitting: General Surgery

## 2017-10-02 ENCOUNTER — Encounter (HOSPITAL_COMMUNITY)
Admission: RE | Disposition: A | Discharge status: Home / Self Care | Payer: Self-pay | Source: Ambulatory Visit | Attending: General Surgery

## 2017-10-02 ENCOUNTER — Encounter (HOSPITAL_COMMUNITY): Payer: Self-pay | Admitting: *Deleted

## 2017-10-02 ENCOUNTER — Ambulatory Visit (HOSPITAL_COMMUNITY): Payer: Medicare Other | Admitting: Certified Registered"

## 2017-10-02 DIAGNOSIS — Z79899 Other long term (current) drug therapy: Secondary | ICD-10-CM | POA: Insufficient documentation

## 2017-10-02 DIAGNOSIS — I10 Essential (primary) hypertension: Secondary | ICD-10-CM | POA: Diagnosis not present

## 2017-10-02 DIAGNOSIS — E119 Type 2 diabetes mellitus without complications: Secondary | ICD-10-CM | POA: Diagnosis not present

## 2017-10-02 DIAGNOSIS — K219 Gastro-esophageal reflux disease without esophagitis: Secondary | ICD-10-CM | POA: Insufficient documentation

## 2017-10-02 DIAGNOSIS — I252 Old myocardial infarction: Secondary | ICD-10-CM | POA: Insufficient documentation

## 2017-10-02 DIAGNOSIS — K8010 Calculus of gallbladder with chronic cholecystitis without obstruction: Secondary | ICD-10-CM | POA: Diagnosis not present

## 2017-10-02 DIAGNOSIS — Z7984 Long term (current) use of oral hypoglycemic drugs: Secondary | ICD-10-CM | POA: Insufficient documentation

## 2017-10-02 DIAGNOSIS — F419 Anxiety disorder, unspecified: Secondary | ICD-10-CM | POA: Insufficient documentation

## 2017-10-02 DIAGNOSIS — I2510 Atherosclerotic heart disease of native coronary artery without angina pectoris: Secondary | ICD-10-CM | POA: Diagnosis not present

## 2017-10-02 DIAGNOSIS — F329 Major depressive disorder, single episode, unspecified: Secondary | ICD-10-CM | POA: Insufficient documentation

## 2017-10-02 DIAGNOSIS — M1990 Unspecified osteoarthritis, unspecified site: Secondary | ICD-10-CM | POA: Insufficient documentation

## 2017-10-02 DIAGNOSIS — F17200 Nicotine dependence, unspecified, uncomplicated: Secondary | ICD-10-CM | POA: Insufficient documentation

## 2017-10-02 HISTORY — PX: CHOLECYSTECTOMY: SHX55

## 2017-10-02 LAB — GLUCOSE, CAPILLARY
Glucose-Capillary: 109 mg/dL — ABNORMAL HIGH (ref 65–99)
Glucose-Capillary: 143 mg/dL — ABNORMAL HIGH (ref 65–99)

## 2017-10-02 SURGERY — LAPAROSCOPIC CHOLECYSTECTOMY
Anesthesia: General | Site: Abdomen

## 2017-10-02 MED ORDER — DEXAMETHASONE SODIUM PHOSPHATE 4 MG/ML IJ SOLN
INTRAMUSCULAR | Status: DC | PRN
  Administered 2017-10-02: 8 mg via INTRAVENOUS

## 2017-10-02 MED ORDER — BUPIVACAINE HCL 0.25 % IJ SOLN
INTRAMUSCULAR | Status: DC | PRN
  Administered 2017-10-02: 6 mL

## 2017-10-02 MED ORDER — BUPIVACAINE HCL (PF) 0.25 % IJ SOLN
INTRAMUSCULAR | Status: AC
  Filled 2017-10-02: qty 60

## 2017-10-02 MED ORDER — OXYCODONE HCL 5 MG PO TABS
ORAL_TABLET | ORAL | Status: AC
  Filled 2017-10-02: qty 1

## 2017-10-02 MED ORDER — ACETAMINOPHEN 650 MG RE SUPP: 650.0000 mg | RECTAL | Status: DC | PRN

## 2017-10-02 MED ORDER — PHENYLEPHRINE 40 MCG/ML (10ML) SYRINGE FOR IV PUSH (FOR BLOOD PRESSURE SUPPORT)
PREFILLED_SYRINGE | INTRAVENOUS | Status: AC
  Filled 2017-10-02: qty 10

## 2017-10-02 MED ORDER — SUGAMMADEX SODIUM 200 MG/2ML IV SOLN
INTRAVENOUS | Status: AC
  Filled 2017-10-02: qty 2

## 2017-10-02 MED ORDER — FENTANYL CITRATE (PF) 250 MCG/5ML IJ SOLN
INTRAMUSCULAR | Status: AC
  Filled 2017-10-02: qty 5

## 2017-10-02 MED ORDER — SODIUM CHLORIDE 0.9% FLUSH: 3.0000 mL | INTRAVENOUS | Status: DC | PRN

## 2017-10-02 MED ORDER — 0.9 % SODIUM CHLORIDE (POUR BTL) OPTIME
TOPICAL | Status: DC | PRN
  Administered 2017-10-02: 1000 mL

## 2017-10-02 MED ORDER — MIDAZOLAM HCL 5 MG/5ML IJ SOLN
INTRAMUSCULAR | Status: DC | PRN
  Administered 2017-10-02 (×2): 1 mg via INTRAVENOUS

## 2017-10-02 MED ORDER — CHLORHEXIDINE GLUCONATE CLOTH 2 % EX PADS: 6.0000 | MEDICATED_PAD | Freq: Once | CUTANEOUS | Status: DC

## 2017-10-02 MED ORDER — ACETAMINOPHEN 325 MG PO TABS
325.0000 mg | ORAL_TABLET | ORAL | Status: DC | PRN
  Administered 2017-10-02: 650 mg via ORAL

## 2017-10-02 MED ORDER — SODIUM CHLORIDE 0.9% FLUSH: 3.0000 mL | Freq: Two times a day (BID) | INTRAVENOUS | Status: DC

## 2017-10-02 MED ORDER — ACETAMINOPHEN 325 MG PO TABS: 650.0000 mg | ORAL_TABLET | ORAL | Status: DC | PRN

## 2017-10-02 MED ORDER — PROPOFOL 10 MG/ML IV BOLUS
INTRAVENOUS | Status: AC
  Filled 2017-10-02: qty 20

## 2017-10-02 MED ORDER — PROPOFOL 10 MG/ML IV BOLUS
INTRAVENOUS | Status: DC | PRN
  Administered 2017-10-02: 110 mg via INTRAVENOUS

## 2017-10-02 MED ORDER — DEXAMETHASONE SODIUM PHOSPHATE 10 MG/ML IJ SOLN
INTRAMUSCULAR | Status: AC
  Filled 2017-10-02: qty 1

## 2017-10-02 MED ORDER — LACTATED RINGERS IV SOLN
INTRAVENOUS | Status: DC | PRN
  Administered 2017-10-02: 07:00:00 via INTRAVENOUS

## 2017-10-02 MED ORDER — SUGAMMADEX SODIUM 200 MG/2ML IV SOLN
INTRAVENOUS | Status: DC | PRN
  Administered 2017-10-02: 275 mg via INTRAVENOUS

## 2017-10-02 MED ORDER — SODIUM CHLORIDE 0.9 % IR SOLN
Status: DC | PRN
  Administered 2017-10-02: 1000 mL

## 2017-10-02 MED ORDER — ONDANSETRON HCL 4 MG/2ML IJ SOLN
INTRAMUSCULAR | Status: AC
  Filled 2017-10-02: qty 2

## 2017-10-02 MED ORDER — OXYCODONE HCL 5 MG PO TABS: 5.0000 mg | ORAL_TABLET | ORAL | Status: DC | PRN

## 2017-10-02 MED ORDER — MORPHINE SULFATE (PF) 2 MG/ML IV SOLN: 2.0000 mg | INTRAVENOUS | Status: DC | PRN

## 2017-10-02 MED ORDER — LIDOCAINE 2% (20 MG/ML) 5 ML SYRINGE
INTRAMUSCULAR | Status: AC
  Filled 2017-10-02: qty 5

## 2017-10-02 MED ORDER — SODIUM CHLORIDE 0.9 % IV SOLN: 250.0000 mL | INTRAVENOUS | Status: DC | PRN

## 2017-10-02 MED ORDER — ACETAMINOPHEN 325 MG PO TABS
ORAL_TABLET | ORAL | Status: AC
  Filled 2017-10-02: qty 2

## 2017-10-02 MED ORDER — MIDAZOLAM HCL 2 MG/2ML IJ SOLN
INTRAMUSCULAR | Status: AC
  Filled 2017-10-02: qty 2

## 2017-10-02 MED ORDER — TRAMADOL HCL 50 MG PO TABS: 50.0000 mg | ORAL_TABLET | Freq: Four times a day (QID) | ORAL | 0 refills | Status: AC | PRN

## 2017-10-02 MED ORDER — OXYCODONE HCL 5 MG/5ML PO SOLN: 5.0000 mg | Freq: Once | ORAL | Status: AC | PRN

## 2017-10-02 MED ORDER — CEFAZOLIN SODIUM-DEXTROSE 2-4 GM/100ML-% IV SOLN
2.0000 g | INTRAVENOUS | Status: AC
  Administered 2017-10-02: 2 g via INTRAVENOUS

## 2017-10-02 MED ORDER — EPHEDRINE 5 MG/ML INJ
INTRAVENOUS | Status: AC
  Filled 2017-10-02: qty 10

## 2017-10-02 MED ORDER — ROCURONIUM BROMIDE 10 MG/ML (PF) SYRINGE
PREFILLED_SYRINGE | INTRAVENOUS | Status: AC
  Filled 2017-10-02: qty 5

## 2017-10-02 MED ORDER — FENTANYL CITRATE (PF) 100 MCG/2ML IJ SOLN
25.0000 ug | INTRAMUSCULAR | Status: DC | PRN
  Administered 2017-10-02: 25 ug via INTRAVENOUS

## 2017-10-02 MED ORDER — ONDANSETRON HCL 4 MG/2ML IJ SOLN
INTRAMUSCULAR | Status: DC | PRN
  Administered 2017-10-02: 4 mg via INTRAVENOUS

## 2017-10-02 MED ORDER — OXYCODONE HCL 5 MG PO TABS
5.0000 mg | ORAL_TABLET | Freq: Once | ORAL | Status: AC | PRN
  Administered 2017-10-02: 5 mg via ORAL

## 2017-10-02 MED ORDER — LIDOCAINE 2% (20 MG/ML) 5 ML SYRINGE
INTRAMUSCULAR | Status: DC | PRN
  Administered 2017-10-02: 60 mg via INTRAVENOUS

## 2017-10-02 MED ORDER — ACETAMINOPHEN 160 MG/5ML PO SOLN: 325.0000 mg | ORAL | Status: DC | PRN

## 2017-10-02 MED ORDER — ROCURONIUM BROMIDE 100 MG/10ML IV SOLN
INTRAVENOUS | Status: DC | PRN
  Administered 2017-10-02: 40 mg via INTRAVENOUS

## 2017-10-02 MED ORDER — EPHEDRINE SULFATE-NACL 50-0.9 MG/10ML-% IV SOSY
PREFILLED_SYRINGE | INTRAVENOUS | Status: DC | PRN
  Administered 2017-10-02: 10 mg via INTRAVENOUS
  Administered 2017-10-02: 5 mg via INTRAVENOUS

## 2017-10-02 MED ORDER — FENTANYL CITRATE (PF) 100 MCG/2ML IJ SOLN
INTRAMUSCULAR | Status: DC | PRN
  Administered 2017-10-02: 100 ug via INTRAVENOUS
  Administered 2017-10-02: 50 ug via INTRAVENOUS

## 2017-10-02 MED ORDER — PHENYLEPHRINE 40 MCG/ML (10ML) SYRINGE FOR IV PUSH (FOR BLOOD PRESSURE SUPPORT)
PREFILLED_SYRINGE | INTRAVENOUS | Status: DC | PRN
  Administered 2017-10-02 (×3): 80 ug via INTRAVENOUS

## 2017-10-02 MED ORDER — FENTANYL CITRATE (PF) 100 MCG/2ML IJ SOLN
INTRAMUSCULAR | Status: AC
  Filled 2017-10-02: qty 2

## 2017-10-02 SURGICAL SUPPLY — 42 items
ADH SKN CLS LQ APL DERMABOND (GAUZE/BANDAGES/DRESSINGS) ×1
APL SKNCLS STERI-STRIP NONHPOA (GAUZE/BANDAGES/DRESSINGS)
BAG SPEC RTRVL 10 TROC 200 (ENDOMECHANICALS)
BENZOIN TINCTURE PRP APPL 2/3 (GAUZE/BANDAGES/DRESSINGS) ×1 IMPLANT
CANISTER SUCT 3000ML PPV (MISCELLANEOUS) ×2 IMPLANT
CHLORAPREP W/TINT 26ML (MISCELLANEOUS) ×2 IMPLANT
CLIP VESOLOCK MED LG 6/CT (CLIP) ×2 IMPLANT
COVER SURGICAL LIGHT HANDLE (MISCELLANEOUS) ×2 IMPLANT
COVER TRANSDUCER ULTRASND (DRAPES) ×2 IMPLANT
DERMABOND ADHESIVE PROPEN (GAUZE/BANDAGES/DRESSINGS) ×1
DERMABOND ADVANCED .7 DNX6 (GAUZE/BANDAGES/DRESSINGS) IMPLANT
ELECT REM PT RETURN 9FT ADLT (ELECTROSURGICAL) ×2
ELECTRODE REM PT RTRN 9FT ADLT (ELECTROSURGICAL) ×1 IMPLANT
GAUZE SPONGE 2X2 8PLY STRL LF (GAUZE/BANDAGES/DRESSINGS) ×1 IMPLANT
GLOVE BIO SURGEON STRL SZ7.5 (GLOVE) ×2 IMPLANT
GOWN STRL REUS W/ TWL LRG LVL3 (GOWN DISPOSABLE) ×2 IMPLANT
GOWN STRL REUS W/ TWL XL LVL3 (GOWN DISPOSABLE) ×1 IMPLANT
GOWN STRL REUS W/TWL LRG LVL3 (GOWN DISPOSABLE) ×4
GOWN STRL REUS W/TWL XL LVL3 (GOWN DISPOSABLE) ×2
GRASPER SUT TROCAR 14GX15 (MISCELLANEOUS) ×2 IMPLANT
KIT BASIN OR (CUSTOM PROCEDURE TRAY) ×2 IMPLANT
KIT ROOM TURNOVER OR (KITS) ×2 IMPLANT
NDL INSUFFLATION 14GA 120MM (NEEDLE) ×1 IMPLANT
NEEDLE INSUFFLATION 14GA 120MM (NEEDLE) ×2 IMPLANT
NS IRRIG 1000ML POUR BTL (IV SOLUTION) ×2 IMPLANT
PAD ARMBOARD 7.5X6 YLW CONV (MISCELLANEOUS) ×4 IMPLANT
POUCH RETRIEVAL ECOSAC 10 (ENDOMECHANICALS) IMPLANT
POUCH RETRIEVAL ECOSAC 10MM (ENDOMECHANICALS)
SCISSORS LAP 5X35 DISP (ENDOMECHANICALS) ×2 IMPLANT
SET IRRIG TUBING LAPAROSCOPIC (IRRIGATION / IRRIGATOR) ×2 IMPLANT
SLEEVE ENDOPATH XCEL 5M (ENDOMECHANICALS) ×2 IMPLANT
SPECIMEN JAR SMALL (MISCELLANEOUS) ×2 IMPLANT
SPONGE GAUZE 2X2 STER 10/PKG (GAUZE/BANDAGES/DRESSINGS)
STRIP CLOSURE SKIN 1/2X4 (GAUZE/BANDAGES/DRESSINGS) ×1 IMPLANT
SUT MNCRL AB 3-0 PS2 18 (SUTURE) ×2 IMPLANT
TOWEL OR 17X24 6PK STRL BLUE (TOWEL DISPOSABLE) ×2 IMPLANT
TOWEL OR 17X26 10 PK STRL BLUE (TOWEL DISPOSABLE) ×2 IMPLANT
TRAY LAPAROSCOPIC MC (CUSTOM PROCEDURE TRAY) ×2 IMPLANT
TROCAR XCEL NON-BLD 11X100MML (ENDOMECHANICALS) ×2 IMPLANT
TROCAR XCEL NON-BLD 5MMX100MML (ENDOMECHANICALS) ×2 IMPLANT
TUBING INSUFFLATION (TUBING) ×2 IMPLANT
WATER STERILE IRR 1000ML POUR (IV SOLUTION) ×1 IMPLANT

## 2017-10-02 NOTE — Op Note (Signed)
10/02/2017  7:56 AM  PATIENT:  [name]  72 y.o. female  PRE-OPERATIVE DIAGNOSIS:  CHOLELITHIASIS  POST-OPERATIVE DIAGNOSIS:  CHOLELITHIASIS  PROCEDURE:  Procedure(s): LAPAROSCOPIC CHOLECYSTECTOMY (N/A)  SURGEON:  Surgeon(s) and Role:    Ralene Ok, MD - Primary  ANESTHESIA:   local and general  EBL:  minimal   BLOOD ADMINISTERED:none  DRAINS: none   LOCAL MEDICATIONS USED:  BUPIVICAINE   SPECIMEN:  Source of Specimen:  gallbladder  DISPOSITION OF SPECIMEN:  PATHOLOGY  COUNTS:  YES  TOURNIQUET:  * No tourniquets in log *  DICTATION: .Dragon Dictation  EBL: <4YC   Complications: none   Counts: reported as correct x 2   Findings:chronically inflamed gallbladder  Indications for procedure: Pt is a 72 y/o F with RUQ pain and seen to have gallstones and possible polyp on [place].  Details of the procedure: The patient was taken to the operating and placed in the supine position with bilateral SCDs in place. A time out was called and all facts were verified. A pneumoperitoneum was obtained via A Veress needle technique to a pressure of 47mm of mercury. A 54mm trochar was then placed in the right upper quadrant under visualization, and there were no injuries to any abdominal organs. A 11 mm port was then placed in the umbilical region after infiltrating with local anesthesia under direct visualization. A second epigastric port was placed under direct visualization.   The gallbladder was identified and retracted, the peritoneum was then sharply dissected from the gallbladder and this dissection was carried down to Calot's triangle. The cystic duct was identified and dissected circumferentially and seen going into the gallbladder 360.  The cystic artery was dissected away from the surrounding tissues.   The critical angle was obtained.   2 clips were placed proximally one distally and the cystic duct transected. The cystic artery was identified and 2 clips placed  proximally and one distally and transected. We then proceeded to remove the gallbladder off the hepatic fossa with Bovie cautery. A retrieval bag was then placed in the abdomen and gallbladder placed in the bag. The hepatic fossa was then reexamined and hemostasis was achieved with Bovie cautery and was excellent at this portion of the case. The subhepatic fossa and perihepatic fossa was then irrigated until the effluent was clear. The specimen bag and specimen were removed from the abdominal cavity.  The 11 mm trocar fascia was reapproximated with the Endo Close #1 Vicryl x2. The pneumoperitoneum was evacuated and all trochars removed under direct visulalization. The skin was then closed with 4-0 Monocryl and the skin dressed with Dermabond. The patient was awaken from general anesthesia and taken to the recovery room in stable condition.    PLAN OF CARE: Discharge to home after PACU  PATIENT DISPOSITION:  PACU - hemodynamically stable.   Delay start of Pharmacological VTE agent (>24hrs) due to surgical blood loss or risk of bleeding: not applicable

## 2017-10-02 NOTE — Anesthesia Procedure Notes (Signed)
Procedure Name: Intubation Date/Time: 10/02/2017 7:30 AM Performed by: Orlie Dakin, CRNA Pre-anesthesia Checklist: Patient identified, Emergency Drugs available, Suction available, Patient being monitored and Timeout performed Patient Re-evaluated:Patient Re-evaluated prior to induction Oxygen Delivery Method: Circle system utilized Preoxygenation: Pre-oxygenation with 100% oxygen Induction Type: IV induction Ventilation: Mask ventilation without difficulty Laryngoscope Size: 3 and Miller Grade View: Grade I Tube type: Oral Tube size: 7.0 mm Number of attempts: 1 Airway Equipment and Method: Stylet Placement Confirmation: ETT inserted through vocal cords under direct vision,  positive ETCO2 and breath sounds checked- equal and bilateral Secured at: 23 cm Tube secured with: Tape Dental Injury: Teeth and Oropharynx as per pre-operative assessment  Comments: 4x4s bite block used.

## 2017-10-02 NOTE — Progress Notes (Signed)
75 mcg of fentanyl wasted, witmessed by Ileene Rubens Rn

## 2017-10-02 NOTE — Transfer of Care (Signed)
Immediate Anesthesia Transfer of Care Note  Patient: [name]  Procedure(s) Performed: LAPAROSCOPIC CHOLECYSTECTOMY (N/A Abdomen)  Patient Location: PACU  Anesthesia Type:General  Level of Consciousness: awake and patient cooperative  Airway & Oxygen Therapy: Patient Spontanous Breathing and Patient connected to face mask oxygen  Post-op Assessment: Report given to RN and Post -op Vital signs reviewed and stable  Post vital signs: Reviewed and stable  Last Vitals:  Vitals:   10/02/17 0540  BP: 119/60  Pulse: 71  Resp: 18  Temp: 36.9 C  SpO2: 100%    Last Pain:  Vitals:   10/02/17 0540  TempSrc: Oral      Patients Stated Pain Goal: 3 (77/37/36 6815)  Complications: No apparent anesthesia complications

## 2017-10-02 NOTE — Interval H&P Note (Signed)
History and Physical Interval Note:  10/02/2017 7:16 AM  [name]  has presented today for surgery, with the diagnosis of gallstones  The various methods of treatment have been discussed with the patient and family. After consideration of risks, benefits and other options for treatment, the patient has consented to  Procedure(s): LAPAROSCOPIC CHOLECYSTECTOMY (N/A) as a surgical intervention .  The patient's history has been reviewed, patient examined, no change in status, stable for surgery.  I have reviewed the patient's chart and labs.  Questions were answered to the patient's satisfaction.     Rosario Jacks., Anne Hahn

## 2017-10-02 NOTE — Anesthesia Preprocedure Evaluation (Addendum)
Anesthesia Evaluation  Patient identified by MRN, date of birth, ID band Patient awake    Reviewed: Allergy & Precautions, NPO status , Patient's Chart, lab work & pertinent test results  History of Anesthesia Complications Negative for: history of anesthetic complications  Airway Mallampati: III  TM Distance: >3 FB Neck ROM: Full    Dental  (+) Teeth Intact, Dental Advisory Given   Pulmonary neg shortness of breath, neg COPD, neg recent URI, Current Smoker,    breath sounds clear to auscultation       Cardiovascular hypertension, Pt. on medications (-) angina+ CAD, + Past MI and + Cardiac Stents  (-) CHF  Rhythm:Regular     Neuro/Psych  Headaches, PSYCHIATRIC DISORDERS Anxiety Depression  Neuromuscular disease    GI/Hepatic Neg liver ROS, hiatal hernia, GERD  Medicated and Controlled,  Endo/Other  diabetes, Type 2  Renal/GU negative Renal ROS     Musculoskeletal  (+) Arthritis ,   Abdominal   Peds  Hematology  (+) anemia ,   Anesthesia Other Findings   Reproductive/Obstetrics                            Anesthesia Physical Anesthesia Plan  ASA: III  Anesthesia Plan: General   Post-op Pain Management:    Induction: Intravenous  PONV Risk Score and Plan: 2 and Ondansetron and Dexamethasone  Airway Management Planned: Oral ETT  Additional Equipment: None  Intra-op Plan:   Post-operative Plan: Extubation in OR  Informed Consent: I have reviewed the patients History and Physical, chart, labs and discussed the procedure including the risks, benefits and alternatives for the proposed anesthesia with the patient or authorized representative who has indicated his/her understanding and acceptance.   Dental advisory given  Plan Discussed with: CRNA and Surgeon  Anesthesia Plan Comments:         Anesthesia Quick Evaluation

## 2017-10-02 NOTE — Discharge Instructions (Signed)
[facility] ______[facility] °LAPAROSCOPIC SURGERY: POST OP INSTRUCTIONS °Always review your discharge instruction sheet given to you by the facility where your surgery was performed. °IF YOU HAVE DISABILITY OR FAMILY LEAVE FORMS, YOU MUST BRING THEM TO THE OFFICE FOR PROCESSING.   °DO NOT GIVE THEM TO YOUR DOCTOR. ° °1. A prescription for pain medication may be given to you upon discharge.  Take your pain medication as prescribed, if needed.  If narcotic pain medicine is not needed, then you may take acetaminophen (Tylenol) or ibuprofen (Advil) as needed. °2. Take your usually prescribed medications unless otherwise directed. °3. If you need a refill on your pain medication, please contact your pharmacy.  They will contact our office to request authorization. Prescriptions will not be filled after 5pm or on week-ends. °4. You should follow a light diet the first few days after arrival home, such as soup and crackers, etc.  Be sure to include lots of fluids daily. °5. Most patients will experience some swelling and bruising in the area of the incisions.  Ice packs will help.  Swelling and bruising can take several days to resolve.  °6. It is common to experience some constipation if taking pain medication after surgery.  Increasing fluid intake and taking a stool softener (such as Colace) will usually help or prevent this problem from occurring.  A mild laxative (Milk of Magnesia or Miralax) should be taken according to package instructions if there are no bowel movements after 48 hours. °7. Unless discharge instructions indicate otherwise, you may remove your bandages 24-48 hours after surgery, and you may shower at that time.  You may have steri-strips (small skin tapes) in place directly over the incision.  These strips should be left on the skin for 7-10 days.  If your surgeon used skin glue on the incision, you may shower in 24 hours.  The glue will flake off over the next 2-3 weeks.  Any sutures or  staples will be removed at the office during your follow-up visit. °8. ACTIVITIES:  You may resume regular (light) daily activities beginning the next day--such as daily self-care, walking, climbing stairs--gradually increasing activities as tolerated.  You may have sexual intercourse when it is comfortable.  Refrain from any heavy lifting or straining until approved by your doctor. °a. You may drive when you are no longer taking prescription pain medication, you can comfortably wear a seatbelt, and you can safely maneuver your car and apply brakes. °b. RETURN TO WORK:  __________________________________________________________ °9. You should see your doctor in the office for a follow-up appointment approximately 2-3 weeks after your surgery.  Make sure that you call for this appointment within a day or two after you arrive home to insure a convenient appointment time. °10. OTHER INSTRUCTIONS: __________________________________________________________________________________________________________________________ __________________________________________________________________________________________________________________________ °WHEN TO CALL YOUR DOCTOR: °1. Fever over 101.0 °2. Inability to urinate °3. Continued bleeding from incision. °4. Increased pain, redness, or drainage from the incision. °5. Increasing abdominal pain ° °The clinic staff is available to answer your questions during regular business hours.  Please don’t hesitate to call and ask to speak to one of the nurses for clinical concerns.  If you have a medical emergency, go to the nearest emergency room or call 911.  A surgeon from [facility] is always on call at the hospital. °[address], [address], [place], [place]  [address] ? [address] [address], [place], [place]   [address] °[phone] ? [phone] ? FAX [phone] °Web site:   [email or web address] °

## 2017-10-05 ENCOUNTER — Encounter (HOSPITAL_COMMUNITY): Payer: Self-pay | Admitting: General Surgery

## 2017-10-05 NOTE — Anesthesia Postprocedure Evaluation (Signed)
Anesthesia Post Note  Patient: Shela Virden  Procedure(s) Performed: LAPAROSCOPIC CHOLECYSTECTOMY (N/A Abdomen)     Patient location during evaluation: PACU Anesthesia Type: General Level of consciousness: awake and alert Pain management: pain level controlled Vital Signs Assessment: post-procedure vital signs reviewed and stable Respiratory status: spontaneous breathing, nonlabored ventilation, respiratory function stable and patient connected to nasal cannula oxygen Cardiovascular status: blood pressure returned to baseline and stable Postop Assessment: no apparent nausea or vomiting Anesthetic complications: no    Last Vitals:  Vitals:   10/02/17 0945 10/02/17 1015  BP: 102/63 109/73  Pulse: 62 70  Resp: 20 20  Temp:    SpO2: 95% 95%    Last Pain:  Vitals:   10/02/17 0943  TempSrc:   PainSc: Asleep                 Mckinsley Koelzer

## 2017-12-01 ENCOUNTER — Ambulatory Visit (INDEPENDENT_AMBULATORY_CARE_PROVIDER_SITE_OTHER): Payer: Medicare Other | Admitting: Orthopedic Surgery

## 2017-12-01 ENCOUNTER — Encounter (INDEPENDENT_AMBULATORY_CARE_PROVIDER_SITE_OTHER): Payer: Self-pay | Admitting: Orthopedic Surgery

## 2017-12-01 VITALS — Ht 59.0 in | Wt 134.0 lb

## 2017-12-01 DIAGNOSIS — M7541 Impingement syndrome of right shoulder: Secondary | ICD-10-CM | POA: Diagnosis not present

## 2017-12-01 MED ORDER — LIDOCAINE HCL 1 % IJ SOLN
5.0000 mL | INTRAMUSCULAR | Status: AC | PRN
  Administered 2017-12-01: 5 mL

## 2017-12-01 MED ORDER — METHYLPREDNISOLONE ACETATE 40 MG/ML IJ SUSP
40.0000 mg | INTRAMUSCULAR | Status: AC | PRN
  Administered 2017-12-01: 40 mg via INTRA_ARTICULAR

## 2017-12-01 NOTE — Progress Notes (Signed)
Office Visit Note   Patient: [name]           Date of Birth: 1946-03-21           MRN: [id number] Visit Date: 12/01/2017              Requested by: [name], MD [facility] [address], [place] [address] PCP: [name], MD  Chief Complaint  Patient presents with  . Right Shoulder - Pain    Last cortisone injection 05/2017      HPI: Patient is a 72 year old woman with recurrent impingement symptoms right shoulder.  Her last injection was October 2018.  She states that it worked well and has worn off she has pain with elevation pain with internal rotation.  Patient is currently on metformin and states that her glucose is under good control.  Assessment & Plan: Visit Diagnoses:  1. Impingement syndrome of right shoulder     Plan: Patient's shoulder was injected without complications.  Follow-Up Instructions: Return if symptoms worsen or fail to improve.   Ortho Exam  Patient is alert, oriented, no adenopathy, well-dressed, normal affect, normal respiratory effort. Examination patient has abduction and flexion to 90 degrees she has pain with Neer and Hawkins impingement test pain with a drop arm test.  The biceps tendon is tender to palpation the [facility] joint is nontender to palpation she has a positive drop arm test.  Imaging: No results found. No images are attached to the encounter.  Labs: Lab Results  Component Value Date   HGBA1C 6.4 (H) 10/01/2017   HGBA1C 6.7 (H) 07/29/2017   HGBA1C 6.3 (H) 04/19/2014   ESRSEDRATE 9 08/29/2007   REPTSTATUS 03/10/2013 FINAL 03/09/2013   CULT NO GROWTH 03/09/2013    @LABSALLVALUES (HGBA1)@  Body mass index is 27.06 kg/m.  Orders:  No orders of the defined types were placed in this encounter.  No orders of the defined types were placed in this encounter.    Procedures: Large Joint Inj: R subacromial bursa on 12/01/2017 11:11 AM Indications: diagnostic evaluation and pain Details: 22 G 1.5 in  needle, posterior approach  Arthrogram: No  Medications: 5 mL lidocaine 1 %; 40 mg methylPREDNISolone acetate 40 MG/ML Outcome: tolerated well, no immediate complications Procedure, treatment alternatives, risks and benefits explained, specific risks discussed. Consent was given by the patient. Immediately prior to procedure a time out was called to verify the correct patient, procedure, equipment, support staff and site/side marked as required. Patient was prepped and draped in the usual sterile fashion.      Clinical Data: No additional findings.  ROS:  All other systems negative, except as noted in the HPI. Review of Systems  Objective: Vital Signs: Ht 4\' 11"  (1.499 m)   Wt 134 lb (60.8 kg)   BMI 27.06 kg/m   Specialty Comments:  No specialty comments available.  PMFS History: Patient Active Problem List   Diagnosis Date Noted  . 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 Chest pain 01/23/2017  . Headache 01/23/2017  . S/P angioplasty with stent 01/23/2017  . Coronary artery disease 11/24/2014  . Syncope 10/22/2014  . Hyperglycemia 04/20/2014  . H/O hiatal hernia 04/19/2014  . 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 HTN (hypertension) 12/08/2011  . GERD (gastroesophageal reflux disease) 12/08/2011  . Depression 12/08/2011  . Dyslipidemia 12/08/2011   Past Medical History:  Diagnosis Date  . Anemia   . Anginal pain ([place])    last  cp was last year  . Anxiety   . Arthritis   . Blood transfusion   . Chest pain 02/04/2016  . Coronary artery disease   . Coronary artery spasm, wth continued episodes of chest pain.  09/26/2013  . Coronary vasospasm ([place])   . Diabetes mellitus without complication ([place])    type II  . GERD (gastroesophageal reflux disease)   . Headache   . Hiatal hernia   . Hypercholesteremia   . Hypertension   . NSTEMI (non-ST elevated myocardial infarction) ([place]) not sure  . NSVT (nonsustained ventricular tachycardia) ([place])  09/26/2013  . Panic attack     Family History  Problem Relation Age of Onset  . Heart failure Mother   . Stroke Sister   . Heart attack Unknown     Past Surgical History:  Procedure Laterality Date  . ABDOMINAL HYSTERECTOMY     PARTIAL HYSTERECTOMY  . breast     right, tumor benign  . BREAST EXCISIONAL BIOPSY    . CARDIAC CATHETERIZATION  2014  . CARDIAC CATHETERIZATION  2012  . CARDIAC CATHETERIZATION N/A 07/22/2016   Procedure: Left Heart Cath and Coronary Angiography;  Surgeon: Belva Crome, MD;  Location: [facility];  Service: Cardiovascular;  Laterality: N/A;  . cardiac stent  2012   to RCA  . CHOLECYSTECTOMY N/A 10/02/2017   Procedure: LAPAROSCOPIC CHOLECYSTECTOMY;  Surgeon: Ralene Ok, MD;  Location: [facility];  Service: General;  Laterality: N/A;  . CORONARY ANGIOPLASTY    . ESOPHAGEAL MANOMETRY N/A 10/29/2016   Procedure: ESOPHAGEAL MANOMETRY (EM);  Surgeon: Garlan Fair, MD;  [facility];  Service: Endoscopy;  Laterality: N/A;  . ESOPHAGOGASTRODUODENOSCOPY (EGD) WITH PROPOFOL N/A 10/28/2016   Procedure: ESOPHAGOGASTRODUODENOSCOPY (EGD) WITH PROPOFOL;  Surgeon: Garlan Fair, MD;  [facility];  Service: Endoscopy;  Laterality: N/A;  . EXCISION OF SKIN TAG N/A 07/29/2017   Procedure: EXCISION OF PERIANAL  SKIN TAG;  Surgeon: Ralene Ok, MD;  Location: [facility];  Service: General;  Laterality: N/A;  . KNEE SURGERY  left  . left ear surgery     for bad cut  . LEFT HEART CATHETERIZATION WITH CORONARY ANGIOGRAM N/A 02/22/2013   Procedure: LEFT HEART CATHETERIZATION WITH CORONARY ANGIOGRAM;  Surgeon: [name], MD;  Location[facility];  Service: Cardiovascular;  Laterality: N/A;  . SHOULDER SURGERY Bilateral    rotator cuff   Social History   Occupational History  . Not on file  Tobacco Use  . Smoking status: Current Some Day Smoker    Packs/day: 1.00    Years: 40.00    Pack years: 40.00    Types: Cigarettes    Last  attempt to quit: 01/10/2016    Years since quitting: 1.8  . Smokeless tobacco: Never Used  . Tobacco comment: occasional   Substance and Sexual Activity  . Alcohol use: Yes    Alcohol/week: 0.6 oz    Types: 1 Cans of beer per week    Comment: occasional beer  . Drug use: No  . Sexual activity: Never

## 2018-01-28 ENCOUNTER — Ambulatory Visit (INDEPENDENT_AMBULATORY_CARE_PROVIDER_SITE_OTHER): Payer: Medicare Other | Admitting: Orthopedic Surgery

## 2018-01-28 ENCOUNTER — Encounter (INDEPENDENT_AMBULATORY_CARE_PROVIDER_SITE_OTHER): Payer: Self-pay | Admitting: Orthopedic Surgery

## 2018-01-28 DIAGNOSIS — M7541 Impingement syndrome of right shoulder: Secondary | ICD-10-CM

## 2018-01-28 MED ORDER — METHYLPREDNISOLONE ACETATE 40 MG/ML IJ SUSP
40.0000 mg | INTRAMUSCULAR | Status: AC | PRN
  Administered 2018-01-28: 40 mg via INTRA_ARTICULAR

## 2018-01-28 MED ORDER — LIDOCAINE HCL 1 % IJ SOLN
5.0000 mL | INTRAMUSCULAR | Status: AC | PRN
  Administered 2018-01-28: 5 mL

## 2018-01-28 NOTE — Progress Notes (Signed)
Office Visit Note   Patient: [name]           Date of Birth: 12-14-1945           MRN: [id number] Visit Date: 01/28/2018              Requested by: [name], MD [facility] [place] [address], [place] [address] PCP: [name], MD  Chief Complaint  Patient presents with  . 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  She has undergone arthroscopic interventions has recurrent pain at this time.  Last injection about 2 months ago provided her good relief.  Assessment & Plan: Visit Diagnoses:  1. Impingement syndrome of right shoulder     Plan: Shoulder was injected without complications she tolerated this well follow-up as needed.  Follow-Up Instructions: Return if symptoms worsen or fail to improve.   Ortho Exam  Patient is alert, oriented, no adenopathy, well-dressed, normal affect, normal respiratory effort. Examination patient has abduction and flexion to 90 degrees.  She has crepitation with range of motion of the right shoulder she has pain with Neer and Hawkins impingement test pain with drop arm test she has 45 degrees of internal and external rotation.  No adhesive capsulitis.  Imaging: No results found. No images are attached to the encounter.  Labs: Lab Results  Component Value Date   HGBA1C 6.4 (H) 10/01/2017   HGBA1C 6.7 (H) 07/29/2017   HGBA1C 6.3 (H) 04/19/2014   ESRSEDRATE 9 08/29/2007   REPTSTATUS 03/10/2013 FINAL 03/09/2013   CULT NO GROWTH 03/09/2013     Lab Results  Component Value Date   ALBUMIN 3.2 (L) 02/04/2016   ALBUMIN 2.8 (L) 04/19/2014   ALBUMIN 3.2 (L) 09/22/2013    There is no height or weight on file to calculate BMI.  Orders:  No orders of the defined types were placed in this encounter.  No orders of the defined types were placed in this encounter.    Procedures: Large Joint Inj: R subacromial bursa on 01/28/2018 10:43  AM Indications: diagnostic evaluation and pain Details: 22 G 1.5 in needle, posterior approach  Arthrogram: No  Medications: 5 mL lidocaine 1 %; 40 mg methylPREDNISolone acetate 40 MG/ML Outcome: tolerated well, no immediate complications Procedure, treatment alternatives, risks and benefits explained, specific risks discussed. Consent was given by the patient. Immediately prior to procedure a time out was called to verify the correct patient, procedure, equipment, support staff and site/side marked as required. Patient was prepped and draped in the usual sterile fashion.      Clinical Data: No additional findings.  ROS:  All other systems negative, except as noted in the HPI. Review of Systems  Objective: Vital Signs: There were no vitals taken for this visit.  Specialty Comments:  No specialty comments available.  PMFS History: Patient Active Problem List   Diagnosis Date Noted  . 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 Chest pain 01/23/2017  . Headache 01/23/2017  . S/P angioplasty with stent 01/23/2017  . Coronary artery disease 11/24/2014  . Syncope 10/22/2014  . Hyperglycemia 04/20/2014  . H/O hiatal hernia 04/19/2014  . 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 HTN (hypertension) 12/08/2011  . GERD (gastroesophageal reflux disease) 12/08/2011  . Depression 12/08/2011  . Dyslipidemia 12/08/2011   Past Medical History:  Diagnosis Date  . Anemia   . Anginal pain (  [place])    last cp was last year  . Anxiety   . Arthritis   . Blood transfusion   . Chest pain 02/04/2016  . Coronary artery disease   . Coronary artery spasm, wth continued episodes of chest pain.  09/26/2013  . Coronary vasospasm ([place])   . Diabetes mellitus without complication ([place])    type II  . GERD (gastroesophageal reflux disease)   . Headache   . Hiatal hernia   . Hypercholesteremia   . Hypertension   . NSTEMI (non-ST elevated myocardial infarction) ([place]) not sure  . NSVT  (nonsustained ventricular tachycardia) ([place]) 09/26/2013  . Panic attack     Family History  Problem Relation Age of Onset  . Heart failure Mother   . Stroke Sister   . Heart attack Unknown     Past Surgical History:  Procedure Laterality Date  . ABDOMINAL HYSTERECTOMY     PARTIAL HYSTERECTOMY  . breast     right, tumor benign  . BREAST EXCISIONAL BIOPSY    . CARDIAC CATHETERIZATION  2014  . CARDIAC CATHETERIZATION  2012  . CARDIAC CATHETERIZATION N/A 07/22/2016   Procedure: Left Heart Cath and Coronary Angiography;  Surgeon: Belva Crome, MD;  Location: [facility];  Service: Cardiovascular;  Laterality: N/A;  . cardiac stent  2012   to RCA  . CHOLECYSTECTOMY N/A 10/02/2017   Procedure: LAPAROSCOPIC CHOLECYSTECTOMY;  Surgeon: Ralene Ok, MD;  Location: [facility];  Service: General;  Laterality: N/A;  . CORONARY ANGIOPLASTY    . ESOPHAGEAL MANOMETRY N/A 10/29/2016   Procedure: ESOPHAGEAL MANOMETRY (EM);  Surgeon: Garlan Fair, MD;  [facility];  Service: Endoscopy;  Laterality: N/A;  . ESOPHAGOGASTRODUODENOSCOPY (EGD) WITH PROPOFOL N/A 10/28/2016   Procedure: ESOPHAGOGASTRODUODENOSCOPY (EGD) WITH PROPOFOL;  Surgeon: Garlan Fair, MD;  [facility];  Service: Endoscopy;  Laterality: N/A;  . EXCISION OF SKIN TAG N/A 07/29/2017   Procedure: EXCISION OF PERIANAL  SKIN TAG;  Surgeon: Ralene Ok, MD;  Location: [facility];  Service: General;  Laterality: N/A;  . KNEE SURGERY  left  . left ear surgery     for bad cut  . LEFT HEART CATHETERIZATION WITH CORONARY ANGIOGRAM N/A 02/22/2013   Procedure: LEFT HEART CATHETERIZATION WITH CORONARY ANGIOGRAM;  Surgeon: [name], MD;  Location[facility];  Service: Cardiovascular;  Laterality: N/A;  . SHOULDER SURGERY Bilateral    rotator cuff   Social History   Occupational History  . Not on file  Tobacco Use  . Smoking status: Current Some Day Smoker    Packs/day: 1.00    Years: 40.00    Pack  years: 40.00    Types: Cigarettes    Last attempt to quit: 01/10/2016    Years since quitting: 2.0  . Smokeless tobacco: Never Used  . Tobacco comment: occasional   Substance and Sexual Activity  . Alcohol use: Yes    Alcohol/week: 0.6 oz    Types: 1 Cans of beer per week    Comment: occasional beer  . Drug use: No  . Sexual activity: Never

## 2018-02-15 ENCOUNTER — Other Ambulatory Visit: Payer: Self-pay | Admitting: Internal Medicine

## 2018-02-15 DIAGNOSIS — Z1231 Encounter for screening mammogram for malignant neoplasm of breast: Secondary | ICD-10-CM

## 2018-03-02 ENCOUNTER — Ambulatory Visit
Admission: RE | Admit: 2018-03-02 | Discharge: 2018-03-02 | Disposition: A | Discharge status: Home / Self Care | Payer: Medicare Other | Source: Ambulatory Visit | Attending: Internal Medicine | Admitting: Internal Medicine

## 2018-03-02 DIAGNOSIS — Z1231 Encounter for screening mammogram for malignant neoplasm of breast: Secondary | ICD-10-CM

## 2018-03-05 ENCOUNTER — Other Ambulatory Visit: Payer: Self-pay | Admitting: Interventional Cardiology

## 2018-05-06 ENCOUNTER — Ambulatory Visit (INDEPENDENT_AMBULATORY_CARE_PROVIDER_SITE_OTHER): Payer: Medicare Other | Admitting: Orthopedic Surgery

## 2018-05-06 ENCOUNTER — Encounter (INDEPENDENT_AMBULATORY_CARE_PROVIDER_SITE_OTHER): Payer: Self-pay | Admitting: Orthopedic Surgery

## 2018-05-06 VITALS — Ht 59.0 in | Wt 134.0 lb

## 2018-05-06 DIAGNOSIS — M7541 Impingement syndrome of right shoulder: Secondary | ICD-10-CM

## 2018-05-06 MED ORDER — METHYLPREDNISOLONE ACETATE 40 MG/ML IJ SUSP
40.0000 mg | INTRAMUSCULAR | Status: AC | PRN
  Administered 2018-05-06: 40 mg via INTRA_ARTICULAR

## 2018-05-06 MED ORDER — LIDOCAINE HCL 1 % IJ SOLN
5.0000 mL | INTRAMUSCULAR | Status: AC | PRN
  Administered 2018-05-06: 5 mL

## 2018-05-06 NOTE — Progress Notes (Signed)
Office Visit Note   Patient: [name]           Date of Birth: 1946-05-31           MRN: [id number] Visit Date: 05/06/2018              Requested by: [name], MD [facility] [place] [address] [place], [place] [address] PCP: [name], MD  Chief Complaint  Patient presents with  . Right Shoulder - Pain      HPI: Patient is a 72 year old woman who presents complaining of pain around the right shoulder radiating into her biceps.  She states she has numbness and tingling for the past 3 days she is helped.  Assessment & Plan: Visit Diagnoses:  1. Impingement syndrome of right shoulder     Plan: Patient underwent subacromial injection for the right shoulder.  Patient will follow-up as needed.  Discussed that we could consider an additional injection in 3 months if she is still symptomatic.  Follow-Up Instructions: Return if symptoms worsen or fail to improve.   Ortho Exam  Patient is alert, oriented, no adenopathy, well-dressed, normal affect, normal respiratory effort. Examination patient has abduction and flexion to 70 degrees passively she has abduction and flexion to 90 degrees.  She has pain with Neer and Hawkins impingement test there is crepitation with range of motion of her shoulder.  The biceps tendon is tender to palpation.  AC joint is nontender to palpation.  Imaging: No results found. No images are attached to the encounter.  Labs: Lab Results  Component Value Date   HGBA1C 6.4 (H) 10/01/2017   HGBA1C 6.7 (H) 07/29/2017   HGBA1C 6.3 (H) 04/19/2014   ESRSEDRATE 9 08/29/2007   REPTSTATUS 03/10/2013 FINAL 03/09/2013   CULT NO GROWTH 03/09/2013     Lab Results  Component Value Date   ALBUMIN 3.2 (L) 02/04/2016   ALBUMIN 2.8 (L) 04/19/2014   ALBUMIN 3.2 (L) 09/22/2013    Body mass index is 27.06 kg/m.  Orders:  No orders of the defined types were placed in this encounter.  No orders of the defined types were placed in this  encounter.    Procedures: Large Joint Inj: R subacromial bursa on 05/06/2018 12:45 PM Indications: diagnostic evaluation and pain Details: 22 G 1.5 in needle, posterior approach  Arthrogram: No  Medications: 5 mL lidocaine 1 %; 40 mg methylPREDNISolone acetate 40 MG/ML Outcome: tolerated well, no immediate complications Procedure, treatment alternatives, risks and benefits explained, specific risks discussed. Consent was given by the patient. Immediately prior to procedure a time out was called to verify the correct patient, procedure, equipment, support staff and site/side marked as required. Patient was prepped and draped in the usual sterile fashion.      Clinical Data: No additional findings.  ROS:  All other systems negative, except as noted in the HPI. Review of Systems  Objective: Vital Signs: Ht 4\' 11"  (1.499 m)   Wt 134 lb (60.8 kg)   BMI 27.06 kg/m   Specialty Comments:  No specialty comments available.  PMFS History: Patient Active Problem List   Diagnosis Date Noted  . 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 Chest pain 01/23/2017  . Headache 01/23/2017  . S/P angioplasty with stent 01/23/2017  . Coronary artery disease 11/24/2014  . Syncope 10/22/2014  . Hyperglycemia 04/20/2014  . H/O hiatal hernia 04/19/2014  . 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 HTN (hypertension) 12/08/2011  . GERD (gastroesophageal  reflux disease) 12/08/2011  . Depression 12/08/2011  . Dyslipidemia 12/08/2011   Past Medical History:  Diagnosis Date  . Anemia   . Anginal pain ([place])    last cp was last year  . Anxiety   . Arthritis   . Blood transfusion   . Chest pain 02/04/2016  . Coronary artery disease   . Coronary artery spasm, wth continued episodes of chest pain.  09/26/2013  . Coronary vasospasm ([place])   . Diabetes mellitus without complication ([place])    type II  . GERD (gastroesophageal reflux disease)   . Headache   . Hiatal hernia   .  Hypercholesteremia   . Hypertension   . NSTEMI (non-ST elevated myocardial infarction) ([place]) not sure  . NSVT (nonsustained ventricular tachycardia) ([place]) 09/26/2013  . Panic attack     Family History  Problem Relation Age of Onset  . Heart failure Mother   . Stroke Sister   . Heart attack Unknown     Past Surgical History:  Procedure Laterality Date  . ABDOMINAL HYSTERECTOMY     PARTIAL HYSTERECTOMY  . breast     right, tumor benign  . BREAST EXCISIONAL BIOPSY Right   . CARDIAC CATHETERIZATION  2014  . CARDIAC CATHETERIZATION  2012  . CARDIAC CATHETERIZATION N/A 07/22/2016   Procedure: Left Heart Cath and Coronary Angiography;  Surgeon: Belva Crome, MD;  Location: [facility];  Service: Cardiovascular;  Laterality: N/A;  . cardiac stent  2012   to RCA  . CHOLECYSTECTOMY N/A 10/02/2017   Procedure: LAPAROSCOPIC CHOLECYSTECTOMY;  Surgeon: Ralene Ok, MD;  Location: [facility];  Service: General;  Laterality: N/A;  . CORONARY ANGIOPLASTY    . ESOPHAGEAL MANOMETRY N/A 10/29/2016   Procedure: ESOPHAGEAL MANOMETRY (EM);  Surgeon: Garlan Fair, MD;  [facility];  Service: Endoscopy;  Laterality: N/A;  . ESOPHAGOGASTRODUODENOSCOPY (EGD) WITH PROPOFOL N/A 10/28/2016   Procedure: ESOPHAGOGASTRODUODENOSCOPY (EGD) WITH PROPOFOL;  Surgeon: Garlan Fair, MD;  [facility];  Service: Endoscopy;  Laterality: N/A;  . EXCISION OF SKIN TAG N/A 07/29/2017   Procedure: EXCISION OF PERIANAL  SKIN TAG;  Surgeon: Ralene Ok, MD;  Location: [facility];  Service: General;  Laterality: N/A;  . KNEE SURGERY  left  . left ear surgery     for bad cut  . LEFT HEART CATHETERIZATION WITH CORONARY ANGIOGRAM N/A 02/22/2013   Procedure: LEFT HEART CATHETERIZATION WITH CORONARY ANGIOGRAM;  Surgeon: [name], MD;  Location: [facility] LAB;  Service: Cardiovascular;  Laterality: N/A;  . SHOULDER SURGERY Bilateral    rotator cuff   Social History   Occupational History    . Not on file  Tobacco Use  . Smoking status: Current Some Day Smoker    Packs/day: 1.00    Years: 40.00    Pack years: 40.00    Types: Cigarettes    Last attempt to quit: 01/10/2016    Years since quitting: 2.3  . Smokeless tobacco: Never Used  . Tobacco comment: occasional   Substance and Sexual Activity  . Alcohol use: Yes    Alcohol/week: 1.0 standard drinks    Types: 1 Cans of beer per week    Comment: occasional beer  . Drug use: No  . Sexual activity: Never

## 2018-05-17 ENCOUNTER — Other Ambulatory Visit: Payer: Self-pay | Admitting: Internal Medicine

## 2018-05-17 ENCOUNTER — Ambulatory Visit
Admission: RE | Admit: 2018-05-17 | Discharge: 2018-05-17 | Disposition: A | Discharge status: Home / Self Care | Payer: Medicare Other | Source: Ambulatory Visit | Attending: Internal Medicine | Admitting: Internal Medicine

## 2018-05-17 DIAGNOSIS — R1031 Right lower quadrant pain: Secondary | ICD-10-CM

## 2018-05-17 MED ORDER — IOPAMIDOL (ISOVUE-300) INJECTION 61%
100.0000 mL | Freq: Once | INTRAVENOUS | Status: AC | PRN
  Administered 2018-05-17: 100 mL via INTRAVENOUS

## 2018-07-03 ENCOUNTER — Other Ambulatory Visit: Payer: Self-pay | Admitting: Interventional Cardiology

## 2018-10-22 ENCOUNTER — Telehealth: Payer: Self-pay | Admitting: *Deleted

## 2018-10-22 NOTE — Telephone Encounter (Signed)
   Primary Cardiologist: [name], MD  Chart reviewed as part of pre-operative protocol coverage. Cataract extractions are recognized in guidelines as low risk surgeries that do not typically require specific preoperative testing or holding of blood thinner therapy. Therefore, given past medical history and time since last visit, based on ACC/AHA guidelines, [name] would be at acceptable risk for the planned procedure without further cardiovascular testing.   I will route this recommendation to the requesting party via Epic fax function and remove from pre-op pool.  Please call with questions.  Lyda Jester, PA-C 10/22/2018, 3:28 PM

## 2018-10-22 NOTE — Telephone Encounter (Signed)
   [facility] Pre-operative Risk Assessment    Request for surgical clearance:  1. What type of surgery is being performed? RIGHT EYE CATARACT EXTRACTION W/INTROCULAR IMPLANT FOLLOWED LEFT EYE   2. When is this surgery scheduled? 11/12/18 AND 12/10/18   3. What type of clearance is required (medical clearance vs. Pharmacy clearance to hold med vs. Both)? MEDICAL  4. Are there any medications that need to be held prior to surgery and how long?NO MEDS NEED TO BE HELD   5. Practice name and name of physician performing surgery? [facility]; DR. Talbert Forest   6. What is your office phone number [phone]   7.   What is your office fax number [phone]  8.   Anesthesia type (None, local, MAC, general) ? TOPICAL WITH IV MEDS    Julaine Hua 10/22/2018, 3:07 PM  _________________________________________________________________   (provider comments below)

## 2018-10-28 ENCOUNTER — Telehealth: Payer: Self-pay | Admitting: *Deleted

## 2018-10-28 NOTE — Telephone Encounter (Signed)
   [facility] Pre-operative Risk Assessment    Request for surgical clearance:  1. What type of surgery is being performed? CATARACT EXTRACTION W/INTROCULAR LENS IMPLANT OF THE RIGHT EYE FOLLOWED BY THE LEFT EYE   2. When is this surgery scheduled? 11/12/18; 12/10/18  3. What type of clearance is required (medical clearance vs. Pharmacy clearance to hold med vs. Both)? MEDICAL  4. Are there any medications that need to be held prior to surgery and how long?NO MEDS NEEDS TO BE HELD PER DR. BEVIS   5. Practice name and name of physician performing surgery? [facility]; DR. Talbert Forest   6. What is your office phone number [phone]    7.   What is your office fax number [phone]  8.   Anesthesia type (None, local, MAC, general) ? TOPICAL WITH IV MEDICATIONS   Julaine Hua 10/28/2018, 12:17 PM  _________________________________________________________________   (provider comments below)

## 2018-10-28 NOTE — Telephone Encounter (Signed)
   Primary Cardiologist: [name], MD  Chart reviewed as part of pre-operative protocol coverage. Cataract extractions are recognized in guidelines as low risk surgeries that do not typically require specific preoperative testing or holding of blood thinner therapy. Therefore, given past medical history and time since last visit, based on ACC/AHA guidelines, [name] would be at acceptable risk for the planned procedure without further cardiovascular testing.   I will route this recommendation to the requesting party via Epic fax function and remove from pre-op pool.  Please call with questions.  [place], [place] 10/28/2018, 12:27 PM

## 2018-12-06 ENCOUNTER — Encounter (INDEPENDENT_AMBULATORY_CARE_PROVIDER_SITE_OTHER): Payer: Self-pay | Admitting: Orthopedic Surgery

## 2018-12-06 ENCOUNTER — Ambulatory Visit (INDEPENDENT_AMBULATORY_CARE_PROVIDER_SITE_OTHER): Payer: Medicare Other | Admitting: Physician Assistant

## 2018-12-06 ENCOUNTER — Ambulatory Visit (INDEPENDENT_AMBULATORY_CARE_PROVIDER_SITE_OTHER): Payer: Medicare Other

## 2018-12-06 ENCOUNTER — Other Ambulatory Visit: Payer: Self-pay

## 2018-12-06 VITALS — Ht 59.0 in | Wt 134.0 lb

## 2018-12-06 DIAGNOSIS — M7541 Impingement syndrome of right shoulder: Secondary | ICD-10-CM | POA: Diagnosis not present

## 2018-12-06 NOTE — Progress Notes (Signed)
Office Visit Note   Patient: [name]           Date of Birth: 01/05/1946           MRN: [id number] Visit Date: 12/06/2018              Requested by: [name], MD [facility] [employer] [address],  [id number] PCP: [name], MD  Chief Complaint  Patient presents with  . Right Shoulder - Follow-up      HPI: The patient is a 73 year old woman who is seen for follow-up of her right shoulder.  She previously is undergone right shoulder steroid injection and reports that this helped a great deal for discomfort.  She reports the pain starts in the upper arm and radiates down below the elbow at times.  She denies any numbness or tingling.  She reports she has had aching over the shoulder and cannot sleep on the shoulder at night.  She desires another steroid injection as it helped significantly.  Assessment & Plan: Visit Diagnoses:  1. Impingement syndrome of right shoulder     Plan: After informed consent the patient's right shoulder was injected with a combination of lidocaine and Depo-Medrol under sterile techniques and the patient tolerated this well.  She was instructed to follow-up in several weeks if not improving.  Follow-Up Instructions: Return if symptoms worsen or fail to improve.   Ortho Exam  Patient is alert, oriented, no adenopathy, well-dressed, normal affect, normal respiratory effort. The patient has decreased forward flexion and pain with lowered forward flexion of the shoulder and pain with abduction.  She has some mild crepitus with range of motion and is tender over the distal insertion of the deltoid.  There is no tenderness there is no tenderness over the [facility] joint.  Imaging: No results found. No images are attached to the encounter.  Labs: Lab Results  Component Value Date   HGBA1C 6.4 (H) 10/01/2017   HGBA1C 6.7 (H) 07/29/2017   HGBA1C 6.3 (H) 04/19/2014   ESRSEDRATE 9 08/29/2007   REPTSTATUS 03/10/2013 FINAL 03/09/2013   CULT NO GROWTH 03/09/2013     Lab Results  Component Value Date   ALBUMIN 3.2 (L) 02/04/2016   ALBUMIN 2.8 (L) 04/19/2014   ALBUMIN 3.2 (L) 09/22/2013    Body mass index is 27.06 kg/m.  Orders:  Orders Placed This Encounter  Procedures  . Large Joint Inj: R subacromial bursa  . XR Shoulder Right   No orders of the defined types were placed in this encounter.    Procedures: Large Joint Inj: R subacromial bursa on 12/06/2018 12:41 PM Indications: diagnostic evaluation and pain Details: 22 G 1.5 in needle, posterior approach  Arthrogram: No  Medications: 5 mL lidocaine 1 %; 40 mg methylPREDNISolone acetate 40 MG/ML Outcome: tolerated well, no immediate complications Procedure, treatment alternatives, risks and benefits explained, specific risks discussed. Consent was given by the patient. Immediately prior to procedure a time out was called to verify the correct patient, procedure, equipment, support staff and site/side marked as required. Patient was prepped and draped in the usual sterile fashion.      Clinical Data: No additional findings.  ROS:  All other systems negative, except as noted in the HPI. Review of Systems  Objective: Vital Signs: Ht 4\' 11"  (1.499 m)   Wt 134 lb (60.8 kg)   BMI 27.06 kg/m   Specialty Comments:  No specialty comments available.  PMFS History: Patient Active Problem List  Diagnosis Date Noted  . 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 Chest pain 01/23/2017  . Headache 01/23/2017  . S/P angioplasty with stent 01/23/2017  . Coronary artery disease 11/24/2014  . Syncope 10/22/2014  . Hyperglycemia 04/20/2014  . H/O hiatal hernia 04/19/2014  . 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 HTN (hypertension) 12/08/2011  . GERD (gastroesophageal reflux disease) 12/08/2011  . Depression 12/08/2011  . Dyslipidemia 12/08/2011   Past Medical History:  Diagnosis Date  . Anemia   . Anginal pain ([place])    last cp was  last year  . Anxiety   . Arthritis   . Blood transfusion   . Chest pain 02/04/2016  . Coronary artery disease   . Coronary artery spasm, wth continued episodes of chest pain.  09/26/2013  . Coronary vasospasm ([place])   . Diabetes mellitus without complication ([place])    type II  . GERD (gastroesophageal reflux disease)   . Headache   . Hiatal hernia   . Hypercholesteremia   . Hypertension   . NSTEMI (non-ST elevated myocardial infarction) ([place]) not sure  . NSVT (nonsustained ventricular tachycardia) ([place]) 09/26/2013  . Panic attack     Family History  Problem Relation Age of Onset  . Heart failure Mother   . Stroke Sister   . Heart attack Unknown     Past Surgical History:  Procedure Laterality Date  . ABDOMINAL HYSTERECTOMY     PARTIAL HYSTERECTOMY  . breast     right, tumor benign  . BREAST EXCISIONAL BIOPSY Right   . CARDIAC CATHETERIZATION  2014  . CARDIAC CATHETERIZATION  2012  . CARDIAC CATHETERIZATION N/A 07/22/2016   Procedure: Left Heart Cath and Coronary Angiography;  Surgeon: Belva Crome, MD;  Location: [facility];  Service: Cardiovascular;  Laterality: N/A;  . cardiac stent  2012   to RCA  . CHOLECYSTECTOMY N/A 10/02/2017   Procedure: LAPAROSCOPIC CHOLECYSTECTOMY;  Surgeon: Ralene Ok, MD;  Location: [facility];  Service: General;  Laterality: N/A;  . CORONARY ANGIOPLASTY    . ESOPHAGEAL MANOMETRY N/A 10/29/2016   Procedure: ESOPHAGEAL MANOMETRY (EM);  Surgeon: Garlan Fair, MD;  [facility];  Service: Endoscopy;  Laterality: N/A;  . ESOPHAGOGASTRODUODENOSCOPY (EGD) WITH PROPOFOL N/A 10/28/2016   Procedure: ESOPHAGOGASTRODUODENOSCOPY (EGD) WITH PROPOFOL;  Surgeon: Garlan Fair, MD;  [facility];  Service: Endoscopy;  Laterality: N/A;  . EXCISION OF SKIN TAG N/A 07/29/2017   Procedure: EXCISION OF PERIANAL  SKIN TAG;  Surgeon: Ralene Ok, MD;  Location: [facility];  Service: General;  Laterality: N/A;  . KNEE SURGERY  left  .  left ear surgery     for bad cut  . LEFT HEART CATHETERIZATION WITH CORONARY ANGIOGRAM N/A 02/22/2013   Procedure: LEFT HEART CATHETERIZATION WITH CORONARY ANGIOGRAM;  Surgeon: [name], MD;  Location: [facility];  Service: Cardiovascular;  Laterality: N/A;  . SHOULDER SURGERY Bilateral    rotator cuff   Social History   Occupational History  . Not on file  Tobacco Use  . Smoking status: Current Some Day Smoker    Packs/day: 1.00    Years: 40.00    Pack years: 40.00    Types: Cigarettes    Last attempt to quit: 01/10/2016    Years since quitting: 2.9  . Smokeless tobacco: Never Used  . Tobacco comment: occasional   Substance and Sexual Activity  . Alcohol use: Yes    Alcohol/week: 1.0  standard drinks    Types: 1 Cans of beer per week    Comment: occasional beer  . Drug use: No  . Sexual activity: Never

## 2018-12-07 MED ORDER — LIDOCAINE HCL 1 % IJ SOLN
5.0000 mL | INTRAMUSCULAR | Status: AC | PRN
  Administered 2018-12-06: 5 mL

## 2018-12-07 MED ORDER — METHYLPREDNISOLONE ACETATE 40 MG/ML IJ SUSP
40.0000 mg | INTRAMUSCULAR | Status: AC | PRN
  Administered 2018-12-06: 40 mg via INTRA_ARTICULAR

## 2019-02-03 ENCOUNTER — Other Ambulatory Visit: Payer: Self-pay | Admitting: Internal Medicine

## 2019-02-03 DIAGNOSIS — Z1231 Encounter for screening mammogram for malignant neoplasm of breast: Secondary | ICD-10-CM

## 2019-02-17 ENCOUNTER — Ambulatory Visit (INDEPENDENT_AMBULATORY_CARE_PROVIDER_SITE_OTHER): Payer: Medicare Other | Admitting: Orthopedic Surgery

## 2019-02-17 ENCOUNTER — Encounter: Payer: Self-pay | Admitting: Physician Assistant

## 2019-02-17 ENCOUNTER — Other Ambulatory Visit: Payer: Self-pay

## 2019-02-17 VITALS — Ht 59.0 in | Wt 134.0 lb

## 2019-02-17 DIAGNOSIS — M7541 Impingement syndrome of right shoulder: Secondary | ICD-10-CM | POA: Diagnosis not present

## 2019-02-17 MED ORDER — LIDOCAINE HCL 1 % IJ SOLN
5.0000 mL | INTRAMUSCULAR | Status: AC | PRN
  Administered 2019-02-17: 5 mL

## 2019-02-17 MED ORDER — METHYLPREDNISOLONE ACETATE 40 MG/ML IJ SUSP
40.0000 mg | INTRAMUSCULAR | Status: AC | PRN
  Administered 2019-02-17: 40 mg via INTRA_ARTICULAR

## 2019-02-17 NOTE — Progress Notes (Signed)
Office Visit Note   Patient: [name]           Date of Birth: Nov 27, 1945           MRN: [id number] Visit Date: 02/17/2019              Requested by: [name], MD [place] [employer] [address],  [place] [address] PCP: [name], MD  Chief Complaint  Patient presents with  . Right Shoulder - Pain      HPI: Patient is a 73 year old woman who presents with recurrent impingement symptoms of the right shoulder.  She was last injected about 3 months ago.  Patient states the pain is recurred she has pain at night pain trying to sleep or work overhead she states the pain is a 5/10.  Assessment & Plan: Visit Diagnoses:  1. Impingement syndrome of right shoulder     Plan: Right shoulder was injected in the subacromial space no restrictions with activities.  Follow-Up Instructions: No follow-ups on file.   Ortho Exam  Patient is alert, oriented, no adenopathy, well-dressed, normal affect, normal respiratory effort. Examination patient has abduction flexion to 120 degrees she has pain with Neer and Hawkins impingement test pain with a drop arm test the biceps tendon is tender to palpation.  Imaging: No results found. No images are attached to the encounter.  Labs: Lab Results  Component Value Date   HGBA1C 6.4 (H) 10/01/2017   HGBA1C 6.7 (H) 07/29/2017   HGBA1C 6.3 (H) 04/19/2014   ESRSEDRATE 9 08/29/2007   REPTSTATUS 03/10/2013 FINAL 03/09/2013   CULT NO GROWTH 03/09/2013     Lab Results  Component Value Date   ALBUMIN 3.2 (L) 02/04/2016   ALBUMIN 2.8 (L) 04/19/2014   ALBUMIN 3.2 (L) 09/22/2013    Lab Results  Component Value Date   MG 2.0 01/24/2017   MG 2.1 09/21/2013   MG 2.0 03/14/2007   No results found for: VD25OH  No results found for: PREALBUMIN CBC EXTENDED Latest Ref Rng & Units 10/01/2017 07/29/2017 01/24/2017  WBC 4.0 - 10.5 K/uL 5.3 5.8 4.5  RBC 3.87 - 5.11 MIL/uL 4.52 4.22 4.15  HGB 12.0 - 15.0 g/dL 13.4 12.7 12.8  HCT  36.0 - 46.0 % 40.4 38.5 38.8  PLT 150 - 400 K/uL 294 293 238  NEUTROABS 1.7 - 7.7 K/uL - - 2.3  LYMPHSABS 0.7 - 4.0 K/uL - - 1.7     Body mass index is 27.06 kg/m.  Orders:  No orders of the defined types were placed in this encounter.  No orders of the defined types were placed in this encounter.    Procedures: Large Joint Inj: R subacromial bursa on 02/17/2019 9:14 AM Indications: diagnostic evaluation and pain Details: 22 G 1.5 in needle, posterior approach  Arthrogram: No  Medications: 5 mL lidocaine 1 %; 40 mg methylPREDNISolone acetate 40 MG/ML Outcome: tolerated well, no immediate complications Procedure, treatment alternatives, risks and benefits explained, specific risks discussed. Consent was given by the patient. Immediately prior to procedure a time out was called to verify the correct patient, procedure, equipment, support staff and site/side marked as required. Patient was prepped and draped in the usual sterile fashion.      Clinical Data: No additional findings.  ROS:  All other systems negative, except as noted in the HPI. Review of Systems  Objective: Vital Signs: Ht 4\' 11"  (1.499 m)   Wt 134 lb (60.8 kg)   BMI 27.06 kg/m  Specialty Comments:  No specialty comments available.  PMFS History: Patient Active Problem List   Diagnosis Date Noted  . Variant angina ([place]) 01/24/2017  . Chest pain 01/23/2017  . Headache 01/23/2017  . S/P angioplasty with stent 01/23/2017  . Coronary artery disease 11/24/2014  . Syncope 10/22/2014  . Hyperglycemia 04/20/2014  . H/O hiatal hernia 04/19/2014  . 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 HTN (hypertension) 12/08/2011  . GERD (gastroesophageal reflux disease) 12/08/2011  . Depression 12/08/2011  . Dyslipidemia 12/08/2011   Past Medical History:  Diagnosis Date  . Anemia   . Anginal pain ([place])    last cp was last year  . Anxiety   . Arthritis   . Blood transfusion   . Chest  pain 02/04/2016  . Coronary artery disease   . Coronary artery spasm, wth continued episodes of chest pain.  09/26/2013  . Coronary vasospasm ([place])   . Diabetes mellitus without complication ([place])    type II  . GERD (gastroesophageal reflux disease)   . Headache   . Hiatal hernia   . Hypercholesteremia   . Hypertension   . NSTEMI (non-ST elevated myocardial infarction) ([place]) not sure  . NSVT (nonsustained ventricular tachycardia) ([place]) 09/26/2013  . Panic attack     Family History  Problem Relation Age of Onset  . Heart failure Mother   . Stroke Sister   . Heart attack Unknown     Past Surgical History:  Procedure Laterality Date  . ABDOMINAL HYSTERECTOMY     PARTIAL HYSTERECTOMY  . breast     right, tumor benign  . BREAST EXCISIONAL BIOPSY Right   . CARDIAC CATHETERIZATION  2014  . CARDIAC CATHETERIZATION  2012  . CARDIAC CATHETERIZATION N/A 07/22/2016   Procedure: Left Heart Cath and Coronary Angiography;  Surgeon: Belva Crome, MD;  Location: [facility];  Service: Cardiovascular;  Laterality: N/A;  . cardiac stent  2012   to RCA  . CHOLECYSTECTOMY N/A 10/02/2017   Procedure: LAPAROSCOPIC CHOLECYSTECTOMY;  Surgeon: Ralene Ok, MD;  Location: [facility];  Service: General;  Laterality: N/A;  . CORONARY ANGIOPLASTY    . ESOPHAGEAL MANOMETRY N/A 10/29/2016   Procedure: ESOPHAGEAL MANOMETRY (EM);  Surgeon: Garlan Fair, MD;  [facility];  Service: Endoscopy;  Laterality: N/A;  . ESOPHAGOGASTRODUODENOSCOPY (EGD) WITH PROPOFOL N/A 10/28/2016   Procedure: ESOPHAGOGASTRODUODENOSCOPY (EGD) WITH PROPOFOL;  Surgeon: Garlan Fair, MD;  [facility];  Service: Endoscopy;  Laterality: N/A;  . EXCISION OF SKIN TAG N/A 07/29/2017   Procedure: EXCISION OF PERIANAL  SKIN TAG;  Surgeon: Ralene Ok, MD;  Location: [facility];  Service: General;  Laterality: N/A;  . KNEE SURGERY  left  . left ear surgery     for bad cut  . LEFT HEART CATHETERIZATION WITH  CORONARY ANGIOGRAM N/A 02/22/2013   Procedure: LEFT HEART CATHETERIZATION WITH CORONARY ANGIOGRAM;  Surgeon: [name], MD;  Location: [facility];  Service: Cardiovascular;  Laterality: N/A;  . SHOULDER SURGERY Bilateral    rotator cuff   Social History   Occupational History  . Not on file  Tobacco Use  . Smoking status: Current Some Day Smoker    Packs/day: 1.00    Years: 40.00    Pack years: 40.00    Types: Cigarettes    Last attempt to quit: 01/10/2016    Years since quitting: 3.1  . Smokeless tobacco: Never Used  . Tobacco comment: occasional  Substance and Sexual Activity  . Alcohol use: Yes    Alcohol/week: 1.0 standard drinks    Types: 1 Cans of beer per week    Comment: occasional beer  . Drug use: No  . Sexual activity: Never

## 2019-02-18 ENCOUNTER — Other Ambulatory Visit: Payer: Self-pay | Admitting: Interventional Cardiology

## 2019-02-18 MED ORDER — NITROGLYCERIN 0.4 MG SL SUBL: SUBLINGUAL_TABLET | SUBLINGUAL | 0 refills | Status: AC

## 2019-03-16 ENCOUNTER — Other Ambulatory Visit: Payer: Self-pay

## 2019-03-16 ENCOUNTER — Ambulatory Visit
Admission: RE | Admit: 2019-03-16 | Discharge: 2019-03-16 | Disposition: A | Discharge status: Home / Self Care | Payer: Medicare Other | Source: Ambulatory Visit | Attending: Internal Medicine | Admitting: Internal Medicine

## 2019-03-16 DIAGNOSIS — Z1231 Encounter for screening mammogram for malignant neoplasm of breast: Secondary | ICD-10-CM

## 2019-05-02 ENCOUNTER — Ambulatory Visit: Payer: Self-pay

## 2019-05-02 ENCOUNTER — Ambulatory Visit (INDEPENDENT_AMBULATORY_CARE_PROVIDER_SITE_OTHER): Payer: Medicare Other | Admitting: Orthopedic Surgery

## 2019-05-02 ENCOUNTER — Encounter: Payer: Self-pay | Admitting: Orthopedic Surgery

## 2019-05-02 VITALS — Ht 59.0 in | Wt 116.0 lb

## 2019-05-02 DIAGNOSIS — M25511 Pain in right shoulder: Secondary | ICD-10-CM

## 2019-05-02 DIAGNOSIS — G8929 Other chronic pain: Secondary | ICD-10-CM | POA: Diagnosis not present

## 2019-05-02 MED ORDER — LIDOCAINE HCL 1 % IJ SOLN
5.0000 mL | INTRAMUSCULAR | Status: AC | PRN
  Administered 2019-05-02: 5 mL

## 2019-05-02 MED ORDER — METHYLPREDNISOLONE ACETATE 40 MG/ML IJ SUSP
40.0000 mg | INTRAMUSCULAR | Status: AC | PRN
  Administered 2019-05-02: 40 mg via INTRA_ARTICULAR

## 2019-05-02 NOTE — Progress Notes (Addendum)
Office Visit Note   Patient: [name]           Date of Birth: 1945-08-29           MRN: [id number] Visit Date: 05/02/2019              Requested by: [name], MD [place] [employer] [address] [place],  [place] [address] PCP: [name], MD  Chief Complaint  Patient presents with  . Right Shoulder - Pain      HPI: Patient is a 72 year old woman with chronic right shoulder pain.  She states that it hurts like a toothache when the weather changes.  She states that the last injection in July helped a little.  She states she has taken gabapentin without relief and she has had no relief from tramadol.  Patient has undergone arthroscopic debridement in the past.  Assessment & Plan: Visit Diagnoses:  1. Chronic right shoulder pain     Plan: Will obtain an MRI scan of the right shoulder to further evaluate the rotator cuff pathology and glenohumeral arthritis.  Again discussed the importance of smoking cessation.  Follow-Up Instructions: Return if symptoms worsen or fail to improve, for Follow-up after MRI scan right shoulder.M[place] Passey Exam  Patient is alert, oriented, no adenopathy, well-dressed, normal affect, normal respiratory effort. Examination patient has abduction and flexion 90 degrees internal/external rotation of 45 degrees she has pain to palpation over the biceps tendon.  Radiographs shows bony spurs with arthritic changes and decreased subacromial joint space.  Imaging: Xr Shoulder Right  Result Date: 05/02/2019 2 view radiographs of the right shoulder shows some arthritic changes the joint space is congruent.  No images are attached to the encounter.  Labs: Lab Results  Component Value Date   HGBA1C 6.4 (H) 10/01/2017   HGBA1C 6.7 (H) 07/29/2017   HGBA1C 6.3 (H) 04/19/2014   ESRSEDRATE 9 08/29/2007   REPTSTATUS 03/10/2013 FINAL 03/09/2013   CULT NO GROWTH 03/09/2013     Lab Results  Component Value Date   ALBUMIN 3.2 (L) 02/04/2016   ALBUMIN 2.8 (L) 04/19/2014   ALBUMIN 3.2 (L) 09/22/2013    Lab Results  Component Value Date   MG 2.0 01/24/2017   MG 2.1 09/21/2013   MG 2.0 03/14/2007   No results found for: VD25OH  No results found for: PREALBUMIN CBC EXTENDED Latest Ref Rng & Units 10/01/2017 07/29/2017 01/24/2017  WBC 4.0 - 10.5 K/uL 5.3 5.8 4.5  RBC 3.87 - 5.11 MIL/uL 4.52 4.22 4.15  HGB 12.0 - 15.0 g/dL 13.4 12.7 12.8  HCT 36.0 - 46.0 % 40.4 38.5 38.8  PLT 150 - 400 K/uL 294 293 238  NEUTROABS 1.7 - 7.7 K/uL - - 2.3  LYMPHSABS 0.7 - 4.0 K/uL - - 1.7     Body mass index is 23.43 kg/m.  Orders:  Orders Placed This Encounter  Procedures  . Large Joint Inj  . XR Shoulder Right  . MR SHOULDER RIGHT WO CONTRAST   No orders of the defined types were placed in this encounter.    Procedures: Large Joint Inj: R subacromial bursa on 05/02/2019 5:44 PM Indications: diagnostic evaluation and pain Details: 22 G 1.5 in needle, posterior approach  Arthrogram: No  Medications: 5 mL lidocaine 1 %; 40 mg methylPREDNISolone acetate 40 MG/ML Outcome: tolerated well, no immediate complications Procedure, treatment alternatives, risks and benefits explained, specific risks discussed. Consent was given by the patient. Immediately prior to procedure a time out was  called to verify the correct patient, procedure, equipment, support staff and site/side marked as required. Patient was prepped and draped in the usual sterile fashion.      Clinical Data: No additional findings.  ROS:  All other systems negative, except as noted in the HPI. Review of Systems  Objective: Vital Signs: Ht 4\' 11"  (1.499 m)   Wt 116 lb (52.6 kg)   BMI 23.43 kg/m   Specialty Comments:  No specialty comments available.  PMFS History: Patient Active Problem List   Diagnosis Date Noted  . Variant angina ([place]) 01/24/2017  . Chest pain 01/23/2017  . Headache 01/23/2017  . S/P angioplasty with stent 01/23/2017  . Coronary artery  disease 11/24/2014  . Syncope 10/22/2014  . Hyperglycemia 04/20/2014  . H/O hiatal hernia 04/19/2014  . 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 HTN (hypertension) 12/08/2011  . GERD (gastroesophageal reflux disease) 12/08/2011  . Depression 12/08/2011  . Dyslipidemia 12/08/2011   Past Medical History:  Diagnosis Date  . Anemia   . Anginal pain ([place])    last cp was last year  . Anxiety   . Arthritis   . Blood transfusion   . Chest pain 02/04/2016  . Coronary artery disease   . Coronary artery spasm, wth continued episodes of chest pain.  09/26/2013  . Coronary vasospasm ([place])   . Diabetes mellitus without complication ([place])    type II  . GERD (gastroesophageal reflux disease)   . Headache   . Hiatal hernia   . Hypercholesteremia   . Hypertension   . NSTEMI (non-ST elevated myocardial infarction) ([place]) not sure  . NSVT (nonsustained ventricular tachycardia) ([place]) 09/26/2013  . Panic attack     Family History  Problem Relation Age of Onset  . Heart failure Mother   . Stroke Sister   . Heart attack Other     Past Surgical History:  Procedure Laterality Date  . ABDOMINAL HYSTERECTOMY     PARTIAL HYSTERECTOMY  . breast     right, tumor benign  . BREAST EXCISIONAL BIOPSY Right   . CARDIAC CATHETERIZATION  2014  . CARDIAC CATHETERIZATION  2012  . CARDIAC CATHETERIZATION N/A 07/22/2016   Procedure: Left Heart Cath and Coronary Angiography;  Surgeon: Belva Crome, MD;  Location: [facility];  Service: Cardiovascular;  Laterality: N/A;  . cardiac stent  2012   to RCA  . CHOLECYSTECTOMY N/A 10/02/2017   Procedure: LAPAROSCOPIC CHOLECYSTECTOMY;  Surgeon: Ralene Ok, MD;  Location: [facility];  Service: General;  Laterality: N/A;  . CORONARY ANGIOPLASTY    . ESOPHAGEAL MANOMETRY N/A 10/29/2016   Procedure: ESOPHAGEAL MANOMETRY (EM);  Surgeon: Garlan Fair, MD;  [facility];  Service: Endoscopy;  Laterality: N/A;  .  ESOPHAGOGASTRODUODENOSCOPY (EGD) WITH PROPOFOL N/A 10/28/2016   Procedure: ESOPHAGOGASTRODUODENOSCOPY (EGD) WITH PROPOFOL;  Surgeon: Garlan Fair, MD;  [facility];  Service: Endoscopy;  Laterality: N/A;  . EXCISION OF SKIN TAG N/A 07/29/2017   Procedure: EXCISION OF PERIANAL  SKIN TAG;  Surgeon: Ralene Ok, MD;  Location: [facility];  Service: General;  Laterality: N/A;  . KNEE SURGERY  left  . left ear surgery     for bad cut  . LEFT HEART CATHETERIZATION WITH CORONARY ANGIOGRAM N/A 02/22/2013   Procedure: LEFT HEART CATHETERIZATION WITH CORONARY ANGIOGRAM;  Surgeon: [name], MD;  Location: [facility];  Service: Cardiovascular;  Laterality: N/A;  . SHOULDER SURGERY Bilateral  rotator cuff   Social History   Occupational History  . Not on file  Tobacco Use  . Smoking status: Current Some Day Smoker    Packs/day: 1.00    Years: 40.00    Pack years: 40.00    Types: Cigarettes    Last attempt to quit: 01/10/2016    Years since quitting: 3.3  . Smokeless tobacco: Never Used  . Tobacco comment: occasional   Substance and Sexual Activity  . Alcohol use: Yes    Alcohol/week: 1.0 standard drinks    Types: 1 Cans of beer per week    Comment: occasional beer  . Drug use: No  . Sexual activity: Never

## 2019-05-15 ENCOUNTER — Other Ambulatory Visit: Payer: Self-pay

## 2019-05-15 ENCOUNTER — Observation Stay (HOSPITAL_COMMUNITY)
Admission: EM | Admit: 2019-05-15 | Discharge: 2019-05-16 | Disposition: A | Discharge status: Home / Self Care | Payer: Medicare Other | Attending: Internal Medicine | Admitting: Internal Medicine

## 2019-05-15 ENCOUNTER — Emergency Department (HOSPITAL_COMMUNITY): Payer: Medicare Other

## 2019-05-15 ENCOUNTER — Observation Stay (HOSPITAL_BASED_OUTPATIENT_CLINIC_OR_DEPARTMENT_OTHER): Payer: Medicare Other

## 2019-05-15 ENCOUNTER — Encounter (HOSPITAL_COMMUNITY): Payer: Self-pay | Admitting: Emergency Medicine

## 2019-05-15 DIAGNOSIS — K219 Gastro-esophageal reflux disease without esophagitis: Secondary | ICD-10-CM | POA: Diagnosis present

## 2019-05-15 DIAGNOSIS — Z8249 Family history of ischemic heart disease and other diseases of the circulatory system: Secondary | ICD-10-CM | POA: Insufficient documentation

## 2019-05-15 DIAGNOSIS — Z79899 Other long term (current) drug therapy: Secondary | ICD-10-CM | POA: Insufficient documentation

## 2019-05-15 DIAGNOSIS — I252 Old myocardial infarction: Secondary | ICD-10-CM | POA: Diagnosis not present

## 2019-05-15 DIAGNOSIS — E785 Hyperlipidemia, unspecified: Secondary | ICD-10-CM | POA: Diagnosis not present

## 2019-05-15 DIAGNOSIS — F17210 Nicotine dependence, cigarettes, uncomplicated: Secondary | ICD-10-CM | POA: Diagnosis not present

## 2019-05-15 DIAGNOSIS — Z7984 Long term (current) use of oral hypoglycemic drugs: Secondary | ICD-10-CM | POA: Diagnosis not present

## 2019-05-15 DIAGNOSIS — G4489 Other headache syndrome: Secondary | ICD-10-CM | POA: Diagnosis present

## 2019-05-15 DIAGNOSIS — F32A Depression, unspecified: Secondary | ICD-10-CM | POA: Diagnosis present

## 2019-05-15 DIAGNOSIS — R0789 Other chest pain: Secondary | ICD-10-CM | POA: Diagnosis not present

## 2019-05-15 DIAGNOSIS — Z7982 Long term (current) use of aspirin: Secondary | ICD-10-CM | POA: Insufficient documentation

## 2019-05-15 DIAGNOSIS — Z95820 Peripheral vascular angioplasty status with implants and grafts: Secondary | ICD-10-CM

## 2019-05-15 DIAGNOSIS — F419 Anxiety disorder, unspecified: Secondary | ICD-10-CM | POA: Diagnosis not present

## 2019-05-15 DIAGNOSIS — E7800 Pure hypercholesterolemia, unspecified: Secondary | ICD-10-CM | POA: Diagnosis not present

## 2019-05-15 DIAGNOSIS — R079 Chest pain, unspecified: Secondary | ICD-10-CM

## 2019-05-15 DIAGNOSIS — I2510 Atherosclerotic heart disease of native coronary artery without angina pectoris: Secondary | ICD-10-CM | POA: Diagnosis not present

## 2019-05-15 DIAGNOSIS — F329 Major depressive disorder, single episode, unspecified: Secondary | ICD-10-CM | POA: Diagnosis not present

## 2019-05-15 DIAGNOSIS — D649 Anemia, unspecified: Secondary | ICD-10-CM | POA: Diagnosis not present

## 2019-05-15 DIAGNOSIS — Z23 Encounter for immunization: Secondary | ICD-10-CM | POA: Diagnosis not present

## 2019-05-15 DIAGNOSIS — R519 Headache, unspecified: Secondary | ICD-10-CM | POA: Diagnosis present

## 2019-05-15 DIAGNOSIS — I25111 Atherosclerotic heart disease of native coronary artery with angina pectoris with documented spasm: Secondary | ICD-10-CM | POA: Diagnosis not present

## 2019-05-15 DIAGNOSIS — Z20828 Contact with and (suspected) exposure to other viral communicable diseases: Secondary | ICD-10-CM | POA: Diagnosis not present

## 2019-05-15 DIAGNOSIS — I10 Essential (primary) hypertension: Secondary | ICD-10-CM | POA: Diagnosis not present

## 2019-05-15 DIAGNOSIS — R072 Precordial pain: Secondary | ICD-10-CM | POA: Diagnosis present

## 2019-05-15 DIAGNOSIS — E119 Type 2 diabetes mellitus without complications: Secondary | ICD-10-CM | POA: Insufficient documentation

## 2019-05-15 DIAGNOSIS — Z955 Presence of coronary angioplasty implant and graft: Secondary | ICD-10-CM | POA: Insufficient documentation

## 2019-05-15 DIAGNOSIS — M1990 Unspecified osteoarthritis, unspecified site: Secondary | ICD-10-CM | POA: Diagnosis not present

## 2019-05-15 LAB — ECHOCARDIOGRAM COMPLETE
Height: 59 in
Weight: 1855.99 oz

## 2019-05-15 LAB — SARS CORONAVIRUS 2 BY RT PCR (HOSPITAL ORDER, PERFORMED IN ~~LOC~~ HOSPITAL LAB): SARS Coronavirus 2: NEGATIVE

## 2019-05-15 LAB — CBC
HCT: 32.2 % — ABNORMAL LOW (ref 36.0–46.0)
Hemoglobin: 10.2 g/dL — ABNORMAL LOW (ref 12.0–15.0)
MCH: 27.1 pg (ref 26.0–34.0)
MCHC: 31.7 g/dL (ref 30.0–36.0)
MCV: 85.4 fL (ref 80.0–100.0)
Platelets: 334 10*3/uL (ref 150–400)
RBC: 3.77 MIL/uL — ABNORMAL LOW (ref 3.87–5.11)
RDW: 17.4 % — ABNORMAL HIGH (ref 11.5–15.5)
WBC: 5.8 10*3/uL (ref 4.0–10.5)
nRBC: 0 % (ref 0.0–0.2)

## 2019-05-15 LAB — GLUCOSE, CAPILLARY
Glucose-Capillary: 103 mg/dL — ABNORMAL HIGH (ref 70–99)
Glucose-Capillary: 97 mg/dL (ref 70–99)

## 2019-05-15 LAB — BASIC METABOLIC PANEL
Anion gap: 10 (ref 5–15)
BUN: 15 mg/dL (ref 8–23)
CO2: 21 mmol/L — ABNORMAL LOW (ref 22–32)
Calcium: 9.2 mg/dL (ref 8.9–10.3)
Chloride: 108 mmol/L (ref 98–111)
Creatinine, Ser: 0.66 mg/dL (ref 0.44–1.00)
GFR calc Af Amer: >60 mL/min (ref >60)
GFR calc non Af Amer: >60 mL/min (ref >60)
Glucose, Bld: 121 mg/dL — ABNORMAL HIGH (ref 70–99)
Potassium: 4 mmol/L (ref 3.5–5.1)
Sodium: 139 mmol/L (ref 135–145)

## 2019-05-15 LAB — TROPONIN I (HIGH SENSITIVITY)
Troponin I (High Sensitivity): 4 ng/L (ref <18)
Troponin I (High Sensitivity): 4 ng/L (ref <18)

## 2019-05-15 LAB — D-DIMER, QUANTITATIVE: D-Dimer, Quant: 0.3 ug/mL-FEU (ref 0.00–0.50)

## 2019-05-15 MED ORDER — ONDANSETRON HCL 4 MG/2ML IJ SOLN: 4.0000 mg | Freq: Four times a day (QID) | INTRAMUSCULAR | Status: DC | PRN

## 2019-05-15 MED ORDER — AMLODIPINE BESYLATE 5 MG PO TABS
2.5000 mg | ORAL_TABLET | Freq: Every day | ORAL | Status: DC
  Administered 2019-05-15: 10:00:00 2.5 mg via ORAL
  Filled 2019-05-15: qty 1

## 2019-05-15 MED ORDER — ISOSORBIDE MONONITRATE ER 60 MG PO TB24
90.0000 mg | ORAL_TABLET | Freq: Every day | ORAL | Status: DC
  Administered 2019-05-15 – 2019-05-16 (×2): 90 mg via ORAL
  Filled 2019-05-15: qty 3
  Filled 2019-05-15: qty 1

## 2019-05-15 MED ORDER — FENTANYL CITRATE (PF) 100 MCG/2ML IJ SOLN
50.0000 ug | Freq: Once | INTRAMUSCULAR | Status: AC
  Administered 2019-05-15: 06:00:00 50 ug via INTRAVENOUS
  Filled 2019-05-15: qty 2

## 2019-05-15 MED ORDER — LIDOCAINE VISCOUS HCL 2 % MT SOLN: 15.0000 mL | Freq: Four times a day (QID) | OROMUCOSAL | Status: DC | PRN

## 2019-05-15 MED ORDER — PNEUMOCOCCAL VAC POLYVALENT 25 MCG/0.5ML IJ INJ
0.5000 mL | INJECTION | INTRAMUSCULAR | Status: AC
  Administered 2019-05-16: 0.5 mL via INTRAMUSCULAR
  Filled 2019-05-15: qty 0.5

## 2019-05-15 MED ORDER — PANTOPRAZOLE SODIUM 40 MG PO TBEC
40.0000 mg | DELAYED_RELEASE_TABLET | Freq: Two times a day (BID) | ORAL | Status: DC
  Administered 2019-05-15 – 2019-05-16 (×3): 40 mg via ORAL
  Filled 2019-05-15 (×3): qty 1

## 2019-05-15 MED ORDER — SODIUM CHLORIDE 0.9% FLUSH: 3.0000 mL | Freq: Once | INTRAVENOUS | Status: DC

## 2019-05-15 MED ORDER — CLONAZEPAM 0.5 MG PO TABS
0.5000 mg | ORAL_TABLET | Freq: Two times a day (BID) | ORAL | Status: DC | PRN
  Administered 2019-05-15: 22:00:00 0.5 mg via ORAL
  Filled 2019-05-15 (×2): qty 1

## 2019-05-15 MED ORDER — ROSUVASTATIN CALCIUM 5 MG PO TABS
5.0000 mg | ORAL_TABLET | Freq: Every day | ORAL | Status: DC
  Administered 2019-05-15: 5 mg via ORAL
  Filled 2019-05-15 (×2): qty 1

## 2019-05-15 MED ORDER — ACETAMINOPHEN 325 MG PO TABS
650.0000 mg | ORAL_TABLET | ORAL | Status: DC | PRN
  Administered 2019-05-15: 19:00:00 650 mg via ORAL
  Filled 2019-05-15 (×2): qty 2

## 2019-05-15 MED ORDER — MORPHINE SULFATE (PF) 2 MG/ML IV SOLN
2.0000 mg | INTRAVENOUS | Status: DC | PRN
  Administered 2019-05-15 (×2): 2 mg via INTRAVENOUS
  Filled 2019-05-15 (×2): qty 1

## 2019-05-15 MED ORDER — NITROGLYCERIN 0.4 MG SL SUBL: 0.4000 mg | SUBLINGUAL_TABLET | SUBLINGUAL | Status: DC | PRN

## 2019-05-15 MED ORDER — BUPROPION HCL ER (SR) 150 MG PO TB12
150.0000 mg | ORAL_TABLET | Freq: Every day | ORAL | Status: DC
  Administered 2019-05-15 – 2019-05-16 (×2): 150 mg via ORAL
  Filled 2019-05-15 (×2): qty 1

## 2019-05-15 MED ORDER — FENTANYL CITRATE (PF) 100 MCG/2ML IJ SOLN
50.0000 ug | Freq: Once | INTRAMUSCULAR | Status: AC
  Administered 2019-05-15: 50 ug via INTRAVENOUS
  Filled 2019-05-15: qty 2

## 2019-05-15 MED ORDER — DIPHENHYDRAMINE HCL 50 MG/ML IJ SOLN
25.0000 mg | Freq: Once | INTRAMUSCULAR | Status: AC
  Administered 2019-05-15: 25 mg via INTRAVENOUS
  Filled 2019-05-15: qty 1

## 2019-05-15 MED ORDER — METOCLOPRAMIDE HCL 5 MG/ML IJ SOLN
10.0000 mg | Freq: Once | INTRAMUSCULAR | Status: AC
  Administered 2019-05-15: 10 mg via INTRAVENOUS
  Filled 2019-05-15: qty 2

## 2019-05-15 MED ORDER — ASPIRIN EC 81 MG PO TBEC
81.0000 mg | DELAYED_RELEASE_TABLET | Freq: Every day | ORAL | Status: DC
  Administered 2019-05-16: 09:00:00 81 mg via ORAL
  Filled 2019-05-15: qty 1

## 2019-05-15 MED ORDER — RANOLAZINE ER 500 MG PO TB12
500.0000 mg | ORAL_TABLET | Freq: Two times a day (BID) | ORAL | Status: DC
  Administered 2019-05-15 – 2019-05-16 (×3): 500 mg via ORAL
  Filled 2019-05-15 (×4): qty 1

## 2019-05-15 MED ORDER — ALUM & MAG HYDROXIDE-SIMETH 200-200-20 MG/5ML PO SUSP
30.0000 mL | Freq: Four times a day (QID) | ORAL | Status: DC | PRN
  Administered 2019-05-15: 19:00:00 30 mL via ORAL
  Filled 2019-05-15: qty 30

## 2019-05-15 MED ORDER — ENOXAPARIN SODIUM 40 MG/0.4ML ~~LOC~~ SOLN
40.0000 mg | SUBCUTANEOUS | Status: DC
  Administered 2019-05-15: 40 mg via SUBCUTANEOUS
  Filled 2019-05-15 (×2): qty 0.4

## 2019-05-15 MED ORDER — SODIUM CHLORIDE 0.9 % IV BOLUS (SEPSIS)
1000.0000 mL | Freq: Once | INTRAVENOUS | Status: AC
  Administered 2019-05-15: 05:00:00 1000 mL via INTRAVENOUS

## 2019-05-15 NOTE — ED Triage Notes (Signed)
Pt. BIB from home c/o intermittent CP of center chest radiating to left side starting yesterday Saturday. Rate CP 9 out of 10, described as sharp, worsen with laying down and sitting up. Pt. States SOB when laying down. Pt. States feeling nausea and lightheaded. Pt sates feeling L arm numb and tingling.   Pt has taken 324 aspirin and 2 SL NTG with no CP relief. Pt. Is sensitive to NTG drop SBP from 136 to 86 after administration of NTG/   HX MI with stent placed. Current smoker, DM, HTN, NSVT, anginal pain, and panic attacks  EMS BP 136/74  PR 66 RR 18 SpO2 97% CBG 118 EKG NSR, no ST elevation

## 2019-05-15 NOTE — ED Notes (Signed)
Patient transported to X-ray 

## 2019-05-15 NOTE — H&P (Signed)
History and Physical    [name] [id number] DOB: 09/20/1945 DOA: 05/15/2019  Referring MD/NP/PA: Ripley Fraise, MD PCP: Jillian Low, MD  Patient coming from: Home  Chief Complaint: Chest pain  I have personally briefly reviewed patient's old medical records in [place]   HPI: [name] is a 73 y.o. female with medical history significant of hypertension, HLD, CAD status post stent with angina, DM type II, anxiety, and tobacco use; who presents with complaints of chest pain.  Symptoms started yesterday with complaints of a frontal headache.  She went to clinic church and when she came back within an hour or so developed severe sharp left-sided chest pain that radiated to the center of her chest.  Denies tenderness to palpation or change in pain with movement or position.  Associated symptoms included nausea, diaphoresis, palpitations, and felt as though she would pass out. Denies having any loss of consciousness, leg swelling, weight gain, abdominal pain, vomiting, cough, or fever.  She took all of her morning medications along with nitroglycerin without complete relief of symptoms.  Pain was initially in 9/10 on the pain scale and since being in the hospital and is down to 6/10.  In route with EMS she was given 2 aspirin and nitroglycerin but her blood pressure dropped.  Previous history of chest pain complaints in the past.  Followed by Dr. Daneen [name] of cardiology in the outpatient setting.  Her last heart cath appears to have been in 07/2016 which revealed EF of 45 to 50%, diffuse 30 to 40% narrowing of the mid right coronary artery/in-stent restenosis, 40 to 50% obstruction on diagonal 1, and otherwise widely patent coronaries.  She was recommended to continue on medical therapy at that time.  ED Course: Upon admission into the emergency department patient was seen to be afebrile, pulse 52-63, blood pressures 108/54 129/92, and all other vital signs maintained.  Labs  significant for WBC 5.8, hemoglobin 10.2, d-dimer negative, and high-sensitivity troponin negative x2.  Chest x-ray was clear and COVID 19 screening negative.  TRH called to admit for chest pain rule out.   Review of Systems  Constitutional: Positive for diaphoresis. Negative for fever.  HENT: Negative for congestion and nosebleeds.   Eyes: Negative for double vision and photophobia.  Respiratory: Negative for cough, shortness of breath and wheezing.   Cardiovascular: Positive for chest pain and palpitations. Negative for leg swelling.  Gastrointestinal: Positive for nausea. Negative for abdominal pain and vomiting.  Genitourinary: Negative for dysuria and hematuria.  Musculoskeletal: Negative for falls.  Skin: Negative for itching.  Neurological: Positive for headaches. Negative for loss of consciousness.  Psychiatric/Behavioral: Negative for suicidal ideas. The patient is nervous/anxious.     Past Medical History:  Diagnosis Date  . Anemia   . Anginal pain ([name])    last cp was last year  . Anxiety   . Arthritis   . Blood transfusion   . Chest pain 02/04/2016  . Coronary artery disease   . Coronary artery spasm, wth continued episodes of chest pain.  09/26/2013  . Coronary vasospasm ([place])   . Diabetes mellitus without complication ([place])    type II  . GERD (gastroesophageal reflux disease)   . Headache   . Hiatal hernia   . Hypercholesteremia   . Hypertension   . NSTEMI (non-ST elevated myocardial infarction) ([place] [address]) not sure  . NSVT (nonsustained ventricular tachycardia) ([place]) 09/26/2013  . Panic attack     Past Surgical History:  Procedure Laterality Date  .  ABDOMINAL HYSTERECTOMY     PARTIAL HYSTERECTOMY  . breast     right, tumor benign  . BREAST EXCISIONAL BIOPSY Right   . CARDIAC CATHETERIZATION  2014  . CARDIAC CATHETERIZATION  2012  . CARDIAC CATHETERIZATION N/A 07/22/2016   Procedure: Left Heart Cath and Coronary Angiography;  Surgeon: Jillian Crome, MD;   Location: [facility];  Service: Cardiovascular;  Laterality: N/A;  . cardiac stent  2012   to RCA  . CHOLECYSTECTOMY N/A 10/02/2017   Procedure: LAPAROSCOPIC CHOLECYSTECTOMY;  Surgeon: Ralene Ok, MD;  Location: [facility];  Service: General;  Laterality: N/A;  . CORONARY ANGIOPLASTY    . ESOPHAGEAL MANOMETRY N/A 10/29/2016   Procedure: ESOPHAGEAL MANOMETRY (EM);  Surgeon: Garlan Fair, MD;  Location: [facility];  Service: Endoscopy;  Laterality: N/A;  . ESOPHAGOGASTRODUODENOSCOPY (EGD) WITH PROPOFOL N/A 10/28/2016   Procedure: ESOPHAGOGASTRODUODENOSCOPY (EGD) WITH PROPOFOL;  Surgeon: Garlan Fair, MD;  Location: [facility];  Service: Endoscopy;  Laterality: N/A;  . EXCISION OF SKIN TAG N/A 07/29/2017   Procedure: EXCISION OF PERIANAL  SKIN TAG;  Surgeon: Ralene Ok, MD;  Location: [facility];  Service: General;  Laterality: N/A;  . KNEE SURGERY  left  . left ear surgery     for bad cut  . LEFT HEART CATHETERIZATION WITH CORONARY ANGIOGRAM N/A 02/22/2013   Procedure: LEFT HEART CATHETERIZATION WITH CORONARY ANGIOGRAM;  Surgeon: [name], MD;  Location[facility];  Service: Cardiovascular;  Laterality: N/A;  . SHOULDER SURGERY Bilateral    rotator cuff     reports that she has been smoking cigarettes. She has a 40.00 pack-year smoking history. She has never used smokeless tobacco. She reports current alcohol use of about 1.0 standard drinks of alcohol per week. She reports that she does not use drugs.  No Known Allergies  Family History  Problem Relation Age of Onset  . Heart failure Mother   . Stroke Sister   . Heart attack Other     Prior to Admission medications   Medication Sig Start Date End Date Taking? Authorizing Provider  acetaminophen (TYLENOL) 500 MG tablet Take 500 mg by mouth daily as needed for moderate pain or headache.   Yes [provider]  amLODipine (NORVASC) 2.5 MG tablet Take 1 tablet (2.5 mg total) by mouth daily. 01/25/17   Yes Eugenie Filler, MD  aspirin EC 81 MG tablet Take 81 mg by mouth daily.   Yes [provider]  buPROPion (WELLBUTRIN SR) 150 MG 12 hr tablet Take 150 mg by mouth daily. For smoking cessation   Yes [provider]  calcium-vitamin D (OSCAL WITH D) 500-200 MG-UNIT per tablet Take 1 tablet by mouth daily.    Yes [provider]  clonazePAM (KLONOPIN) 0.5 MG tablet Take 0.5 mg by mouth 2 (two) times daily as needed for anxiety.    Yes [provider]  isosorbide mononitrate (IMDUR) 60 MG 24 hr tablet TAKE 1 AND 1/2 TABLETS BY MOUTH DAILY. NEED TO SCHEDULE YEARLY APPOINTMENT FOR FURTHER REFILLS Patient taking differently: Take 90 mg by mouth daily.  07/05/18  Yes Jillian Crome, MD  metFORMIN (GLUCOPHAGE) 500 MG tablet Take 500 mg by mouth 2 (two) times daily.   Yes [provider]  nitroGLYCERIN (NITROSTAT) 0.4 MG SL tablet PLACE 1 TABLET UNDER TONGUE AS NEEDED FOR CHEST PAIN EVERY 5 MINUTES UP TO 3 DOSES THEN SEEK MEDICAL ATTENTION. Please make overdue appt. 1st attempt Patient taking  differently: Place 0.4 mg under the tongue every 5 (five) minutes as needed for chest pain.  02/18/19  Yes Jillian Crome, MD  pantoprazole (PROTONIX) 40 MG tablet Take 1 tablet (40 mg total) by mouth 2 (two) times daily. 04/20/14  Yes Isaiah Serge, NP  RANEXA 500 MG 12 hr tablet TAKE 1 TABLET(500 MG) BY MOUTH TWICE DAILY Patient taking differently: Take 500 mg by mouth 2 (two) times daily.  12/31/16  Yes Jillian Crome, MD  ranitidine (ZANTAC) 150 MG tablet Take 150 mg by mouth 2 (two) times daily.    Yes [provider]  rosuvastatin (CRESTOR) 5 MG tablet Take 1 tablet (5 mg total) by mouth daily at 6 PM. 01/24/17  Yes Eugenie Filler, MD    Physical Exam:  Constitutional: Elderly female who appears to be in NAD, calm, comfortable Vitals:   05/15/19 0545 05/15/19 0630 05/15/19 0715 05/15/19 0726  BP: (!) 108/54 (!) 129/92 (!) 102/53   Pulse: (!) 52 63  (!) 59   Resp: 20 18 20    Temp:    97.8 F (36.6 C)  TempSrc:    Oral  SpO2: 98% 98% 98%   Weight:      Height:       Eyes: PERRL, lids and conjunctivae normal ENMT: Mucous membranes are moist. Posterior pharynx clear of any exudate or lesions.Normal dentition.  Neck: normal, supple, no masses, no thyromegaly Respiratory: clear to auscultation bilaterally, no wheezing, no crackles. Normal respiratory effort. No accessory muscle use.  Cardiovascular: Regular rate and rhythm, no murmurs / rubs / gallops. No extremity edema. 2+ pedal pulses. No carotid bruits.  Abdomen: no tenderness, no masses palpated. No hepatosplenomegaly. Bowel sounds positive.  Musculoskeletal: no clubbing / cyanosis. No joint deformity upper and lower extremities. Good ROM, no contractures. Normal muscle tone.  Skin: no rashes, lesions, ulcers. No induration Neurologic: CN 2-12 grossly intact. Sensation intact, DTR normal. Strength 5/5 in all 4.  Psychiatric: Normal judgment and insight. Alert and oriented x 3. Normal mood.     Labs on Admission: I have personally reviewed following labs and imaging studies  CBC: Recent Labs  Lab 05/15/19 0412  WBC 5.8  HGB 10.2*  HCT 32.2*  MCV 85.4  PLT [id number]   Basic Metabolic Panel: Recent Labs  Lab 05/15/19 0412  NA 139  K 4.0  CL 108  CO2 21*  GLUCOSE 121*  BUN 15  CREATININE 0.66  CALCIUM 9.2   GFR: Estimated Creatinine Clearance: 46.5 mL/min (by C-G formula based on SCr of 0.66 mg/dL). Liver Function Tests: No results for input(s): AST, ALT, ALKPHOS, BILITOT, PROT, ALBUMIN in the last 168 hours. No results for input(s): LIPASE, AMYLASE in the last 168 hours. No results for input(s): AMMONIA in the last 168 hours. Coagulation Profile: No results for input(s): INR, PROTIME in the last 168 hours. Cardiac Enzymes: No results for input(s): CKTOTAL, CKMB, CKMBINDEX, TROPONINI in the last 168 hours. BNP (last 3 results) No results for input(s): PROBNP in  the last 8760 hours. HbA1C: No results for input(s): HGBA1C in the last 72 hours. CBG: No results for input(s): GLUCAP in the last 168 hours. Lipid Profile: No results for input(s): CHOL, HDL, LDLCALC, TRIG, CHOLHDL, LDLDIRECT in the last 72 hours. Thyroid Function Tests: No results for input(s): TSH, T4TOTAL, FREET4, T3FREE, THYROIDAB in the last 72 hours. Anemia Panel: No results for input(s): VITAMINB12, FOLATE, FERRITIN, TIBC, IRON, RETICCTPCT in the last 72 hours. Urine analysis:  Component Value Date/Time   COLORURINE YELLOW 03/09/2013 1215   APPEARANCEUR HAZY (A) 03/09/2013 1215   LABSPEC 1.027 03/09/2013 1215   PHURINE 5.5 03/09/2013 1215   GLUCOSEU NEGATIVE 03/09/2013 1215   HGBUR NEGATIVE 03/09/2013 1215   BILIRUBINUR SMALL (A) 03/09/2013 1215   KETONESUR 15 (A) 03/09/2013 1215   PROTEINUR NEGATIVE 03/09/2013 1215   UROBILINOGEN 1.0 03/09/2013 1215   NITRITE NEGATIVE 03/09/2013 1215   LEUKOCYTESUR SMALL (A) 03/09/2013 1215   Sepsis Labs: Recent Results (from the past 240 hour(s))  SARS Coronavirus 2 [facility] order, Performed in [facility] hospital lab) Nasopharyngeal Nasopharyngeal Swab     Status: None   Collection Time: 05/15/19  5:16 AM   Specimen: Nasopharyngeal Swab  Result Value Ref Range Status   SARS Coronavirus 2 NEGATIVE NEGATIVE Final    Comment: (NOTE) If result is NEGATIVE SARS-CoV-2 target nucleic acids are NOT DETECTED. The SARS-CoV-2 RNA is generally detectable in upper and lower  respiratory specimens during the acute phase of infection. The lowest  concentration of SARS-CoV-2 viral copies this assay can detect is 250  copies / mL. A negative result does not preclude SARS-CoV-2 infection  and should not be used as the sole basis for treatment or other  patient management decisions.  A negative result may occur with  improper specimen collection / handling, submission of specimen other  than nasopharyngeal swab, presence of viral mutation(s)  within the  areas targeted by this assay, and inadequate number of viral copies  (<250 copies / mL). A negative result must be combined with clinical  observations, patient history, and epidemiological information. If result is POSITIVE SARS-CoV-2 target nucleic acids are DETECTED. The SARS-CoV-2 RNA is generally detectable in upper and lower  respiratory specimens dur ing the acute phase of infection.  Positive  results are indicative of active infection with SARS-CoV-2.  Clinical  correlation with patient history and other diagnostic information is  necessary to determine patient infection status.  Positive results do  not rule out bacterial infection or co-infection with other viruses. If result is PRESUMPTIVE POSTIVE SARS-CoV-2 nucleic acids MAY BE PRESENT.   A presumptive positive result was obtained on the submitted specimen  and confirmed on repeat testing.  While 2019 novel coronavirus  (SARS-CoV-2) nucleic acids may be present in the submitted sample  additional confirmatory testing may be necessary for epidemiological  and / or clinical management purposes  to differentiate between  SARS-CoV-2 and other Sarbecovirus currently known to infect humans.  If clinically indicated additional testing with an alternate test  methodology [phone]) is advised. The SARS-CoV-2 RNA is generally  detectable in upper and lower respiratory sp ecimens during the acute  phase of infection. The expected result is Negative. Fact Sheet for Patients:  StrictlyIdeas.no Fact Sheet for Healthcare Providers: BankingDealers.co.za This test is not yet approved or cleared by the [place] FDA and has been authorized for detection and/or diagnosis of SARS-CoV-2 by FDA under an Emergency Use Authorization (EUA).  This EUA will remain in effect (meaning this test can be used) for the duration of the COVID-19 declaration under Section 564(b)(1) of the Act,  21 U.S.C. section 360bbb-3(b)(1), unless the authorization is terminated or revoked sooner. Performed at [facility], [place] [address] [place], [place] [address]      Radiological Exams on Admission: Dg Chest 2 View  Result Date: 05/15/2019 CLINICAL DATA:  Chest pain EXAM: CHEST - 2 VIEW COMPARISON:  01/23/2017 FINDINGS: The heart size and mediastinal contours  are within normal limits. Both lungs are clear. The visualized skeletal structures are unremarkable. IMPRESSION: No active cardiopulmonary disease. Electronically Signed   By: Kathreen Devoid   On: 05/15/2019 05:05    EKG: Independently reviewed. Sinus rhythm at 61 bpm otherwise similar to previous tracings  Assessment/Plan Chest pain: Acute.  Patient presents with complaints chest pain.  Heart score of 3 chest x-ray clear, high-sensitivity troponins negative x2, d-dimer negative, and EKG -Admit to medical telemetry bed -Chest pain order set utilized -Check echocardiogram -Continue aspirin -Morphine IV as needed pain  -Formally consult cardiology if echocardiogram shows worsening heart function  CAD: Patient with previous history of stents.  Last cardiac cath in 07/2016 revealed EF of 45 to 50%, diffuse 30 to 40% narrowing of the mid right coronary artery/in-stent restenosis, 40 to 50% obstruction on diagonal 1, and otherwise widely patent coronaries. -Continue aspirin, statin, isosorbide mononitrate, and Ranexa  Normocytic anemia: Acute.  Hemoglobin 10.2 with elevated RDW.  Question possibility of iron deficiency. -Consider iron studies in outpatient  Essential hypertension: Blood pressures [name] normal on admission after receiving nitroglycerin. -Held amlodipine  Diabetes mellitus type 2: Last hemoglobin A1c 6.4 on 10/01/2017.  Patient appears to be well controlled on metformin. -Hypoglycemic protocol -Held metformin  -Continue to monitor and start sliding scale insulin warranted  Anxiety  -Continue Klonopin as needed  anxiety  Hyperlipidemia -Continue Crestor  Tobacco abuse: Patient reports being in the process of trying to quit and has cut back to approximately 3 cigarettes or less per day. -Continue Wellbutrin -Continue to encourage continued cessation of tobacco use  DVT prophylaxis: Lovenox Code Status: Full Family Communication: No family requested to be updated Disposition Plan: Possible discharge home in 1 day Consults called: None Admission status: observation  Norval Morton MD [facility] Pager [phone]   If 7PM-7AM, please contact night-coverage [email or web address] Password [email or web address]  05/15/2019, 7:59 AM

## 2019-05-15 NOTE — ED Provider Notes (Signed)
[facility] EMERGENCY DEPARTMENT Provider Note   CSN: [id number] Arrival date & time: 05/15/19  0357     History   Chief Complaint Chief Complaint  Patient presents with  . Chest Pain    HPI [name] is a 73 y.o. female.     The history is provided by the patient.  Chest Pain Pain location:  Substernal area Pain quality: sharp   Pain radiates to:  Does not radiate Pain severity:  Moderate Onset quality:  Sudden Duration:  1 day Timing:  Intermittent Progression:  Worsening Chronicity:  New Context comment:  Certain positions Relieved by:  Nothing Worsened by:  Certain positions Ineffective treatments:  Aspirin and nitroglycerin Associated symptoms: dizziness, headache and shortness of breath   Associated symptoms: no abdominal pain, no cough, no fever, no syncope and no vomiting   Associated symptoms comment:  Tingling in left arm  Patient with history of CAD, diabetes presents with chest pain.  She reports she had episodes of chest pain a week ago that resolved spontaneously.  She reports over the past day the pain has returned.  She describes it as sharp and appears to worsen with sitting forward and lying back.  No fevers or vomiting.  She does report shortness of breath.  She reports some dizziness and headache as well.  No syncope.  She was given aspirin and nitroglycerin without relief by EMS.   She did have a drop in her blood pressure from the nitroglycerin Past Medical History:  Diagnosis Date  . Anemia   . Anginal pain ([place])    last cp was last year  . Anxiety   . Arthritis   . Blood transfusion   . Chest pain 02/04/2016  . Coronary artery disease   . Coronary artery spasm, wth continued episodes of chest pain.  09/26/2013  . Coronary vasospasm ([place])   . Diabetes mellitus without complication ([place])    type II  . GERD (gastroesophageal reflux disease)   . Headache   . Hiatal hernia   . Hypercholesteremia   . Hypertension    . NSTEMI (non-ST elevated myocardial infarction) ([place]) not sure  . NSVT (nonsustained ventricular tachycardia) ([place]) 09/26/2013  . Panic attack     Patient Active Problem List   Diagnosis Date Noted  . Variant angina ([place]) 01/24/2017  . Chest pain 01/23/2017  . Headache 01/23/2017  . S/P angioplasty with stent 01/23/2017  . Coronary artery disease 11/24/2014  . Syncope 10/22/2014  . Hyperglycemia 04/20/2014  . H/O hiatal hernia 04/19/2014  . 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 Coronary artery vasospasm ([place]) 09/26/2013  . HTN (hypertension) 12/08/2011  . GERD (gastroesophageal reflux disease) 12/08/2011  . Depression 12/08/2011  . Dyslipidemia 12/08/2011    Past Surgical History:  Procedure Laterality Date  . ABDOMINAL HYSTERECTOMY     PARTIAL HYSTERECTOMY  . breast     right, tumor benign  . BREAST EXCISIONAL BIOPSY Right   . CARDIAC CATHETERIZATION  2014  . CARDIAC CATHETERIZATION  2012  . CARDIAC CATHETERIZATION N/A 07/22/2016   Procedure: Left Heart Cath and Coronary Angiography;  Surgeon: Belva Crome, MD;  Location: [facility];  Service: Cardiovascular;  Laterality: N/A;  . cardiac stent  2012   to RCA  . CHOLECYSTECTOMY N/A 10/02/2017   Procedure: LAPAROSCOPIC CHOLECYSTECTOMY;  Surgeon: Ralene Ok, MD;  Location: [facility];  Service: General;  Laterality: N/A;  . CORONARY ANGIOPLASTY    . ESOPHAGEAL MANOMETRY N/A 10/29/2016  Procedure: ESOPHAGEAL MANOMETRY (EM);  Surgeon: Garlan Fair, MD;  Location: [facility];  Service: Endoscopy;  Laterality: N/A;  . ESOPHAGOGASTRODUODENOSCOPY (EGD) WITH PROPOFOL N/A 10/28/2016   Procedure: ESOPHAGOGASTRODUODENOSCOPY (EGD) WITH PROPOFOL;  Surgeon: Garlan Fair, MD;  Location: [facility];  Service: Endoscopy;  Laterality: N/A;  . EXCISION OF SKIN TAG N/A 07/29/2017   Procedure: EXCISION OF PERIANAL  SKIN TAG;  Surgeon: Ralene Ok, MD;  Location: [facility];  Service: General;  Laterality: N/A;  . KNEE SURGERY  left  . left ear  surgery     for bad cut  . LEFT HEART CATHETERIZATION WITH CORONARY ANGIOGRAM N/A 02/22/2013   Procedure: LEFT HEART CATHETERIZATION WITH CORONARY ANGIOGRAM;  Surgeon: [name], MD;  Location: [facility];  Service: Cardiovascular;  Laterality: N/A;  . SHOULDER SURGERY Bilateral    rotator cuff     OB History   No obstetric history on file.      Home Medications    Prior to Admission medications   Medication Sig Start Date End Date Taking? Authorizing Provider  acetaminophen (TYLENOL) 500 MG tablet Take 500 mg by mouth daily as needed for moderate pain or headache.    [provider]  amLODipine (NORVASC) 2.5 MG tablet Take 1 tablet (2.5 mg total) by mouth daily. 01/25/17   Eugenie Filler, MD  aspirin EC 81 MG tablet Take 81 mg by mouth daily.    [provider]  buPROPion (WELLBUTRIN SR) 150 MG 12 hr tablet Take 150 mg by mouth daily. For smoking cessation    [provider]  calcium-vitamin D (OSCAL WITH D) 500-200 MG-UNIT per tablet Take 1 tablet by mouth daily.     [provider]  clonazePAM (KLONOPIN) 0.5 MG tablet Take 0.5 mg by mouth 2 (two) times daily as needed for anxiety.     [provider]  isosorbide mononitrate (IMDUR) 60 MG 24 hr tablet TAKE 1 AND 1/2 TABLETS BY MOUTH DAILY. NEED TO SCHEDULE YEARLY APPOINTMENT FOR FURTHER REFILLS 07/05/18   Belva Crome, MD  metFORMIN (GLUCOPHAGE) 500 MG tablet Take 500 mg by mouth 2 (two) times daily.    [provider]  nitroGLYCERIN (NITROSTAT) 0.4 MG SL tablet PLACE 1 TABLET UNDER TONGUE AS NEEDED FOR CHEST PAIN EVERY 5 MINUTES UP TO 3 DOSES THEN SEEK MEDICAL ATTENTION. Please make overdue appt. 1st attempt 02/18/19   Belva Crome, MD  pantoprazole (PROTONIX) 40 MG tablet Take 1 tablet (40 mg total) by mouth 2 (two) times daily. 04/20/14   Isaiah Serge, NP  RANEXA 500 MG 12 hr tablet TAKE 1 TABLET(500 MG) BY MOUTH TWICE DAILY 12/31/16   Belva Crome, MD   ranitidine (ZANTAC) 150 MG tablet Take 150 mg by mouth 2 (two) times daily.     [provider]  rosuvastatin (CRESTOR) 5 MG tablet Take 1 tablet (5 mg total) by mouth daily at 6 PM. 01/24/17   Eugenie Filler, MD    Family History Family History  Problem Relation Age of Onset  . Heart failure Mother   . Stroke Sister   . Heart attack Other     Social History Social History   Tobacco Use  . Smoking status: Current Some Day Smoker    Packs/day: 1.00    Years: 40.00    Pack years: 40.00    Types: Cigarettes    Last attempt to quit: 01/10/2016    Years since quitting: 3.3  .  Smokeless tobacco: Never Used  . Tobacco comment: occasional   Substance Use Topics  . Alcohol use: Yes    Alcohol/week: 1.0 standard drinks    Types: 1 Cans of beer per week    Comment: occasional beer  . Drug use: No     Allergies   Patient has no known allergies.   Review of Systems Review of Systems  Constitutional: Negative for fever.  Respiratory: Positive for shortness of breath. Negative for cough.   Cardiovascular: Positive for chest pain. Negative for syncope.  Gastrointestinal: Negative for abdominal pain and vomiting.  Neurological: Positive for dizziness and headaches. Negative for syncope.  All other systems reviewed and are negative.    Physical Exam Updated Vital Signs BP 110/67   Pulse (!) 57   Temp 98 F (36.7 C) (Oral)   Resp 17   Ht 1.499 m (4\' 11" )   Wt 52.6 kg   SpO2 96%   BMI 23.43 kg/m   Physical Exam CONSTITUTIONAL: Well developed/well nourished, uncomfortable appearing HEAD: Normocephalic/atraumatic EYES: EOMI/PERRL ENMT: Mucous membranes moist NECK: supple no meningeal signs SPINE/BACK:entire spine nontender CV: S1/S2 noted, no murmurs/rubs/gallops noted LUNGS: Lungs are clear to auscultation bilaterally, no apparent distress ABDOMEN: soft, nontender, no rebound or guarding, bowel sounds noted throughout abdomen GU:no cva tenderness NEURO:  Pt is awake/alert/appropriate, moves all extremitiesx4.  No facial droop.  No arm or leg weakness. EXTREMITIES: pulses normal/equal x4, full ROM SKIN: warm, color normal PSYCH: no abnormalities of mood noted, alert and oriented to situation   ED Treatments / Results  Labs (all labs ordered are listed, but only abnormal results are displayed) Labs Reviewed  BASIC METABOLIC PANEL - Abnormal; Notable for the following components:      Result Value   CO2 21 (*)    Glucose, Bld 121 (*)    All other components within normal limits  CBC - Abnormal; Notable for the following components:   RBC 3.77 (*)    Hemoglobin 10.2 (*)    HCT 32.2 (*)    RDW 17.4 (*)    All other components within normal limits  SARS CORONAVIRUS 2 (HOSPITAL ORDER, [facility])  D-DIMER, QUANTITATIVE (NOT AT [facility])  TROPONIN I (HIGH SENSITIVITY)  TROPONIN I (HIGH SENSITIVITY)    EKG EKG Interpretation  Date/Time:  Sunday May 15 2019 04:03:05 EDT Ventricular Rate:  61 PR Interval:    QRS Duration: 93 QT Interval:  472 QTC Calculation: 476 R Axis:   -8 Text Interpretation:  Sinus rhythm Probable left atrial enlargement Abnormal R-wave progression, early transition No significant change since last tracing Confirmed by Ripley Fraise [phone]) on 05/15/2019 4:07:31 AM   Radiology Dg Chest 2 View  Result Date: 05/15/2019 CLINICAL DATA:  Chest pain EXAM: CHEST - 2 VIEW COMPARISON:  01/23/2017 FINDINGS: The heart size and mediastinal contours are within normal limits. Both lungs are clear. The visualized skeletal structures are unremarkable. IMPRESSION: No active cardiopulmonary disease. Electronically Signed   By: Kathreen Devoid   On: 05/15/2019 05:05    Procedures Procedures    Medications Ordered in ED Medications  sodium chloride flush (NS) 0.9 % injection 3 mL (3 mLs Intravenous Not Given 05/15/19 0414)  metoCLOPramide (REGLAN) injection 10 mg (has no administration in time  range)  diphenhydrAMINE (BENADRYL) injection 25 mg (has no administration in time range)  fentaNYL (SUBLIMAZE) injection 50 mcg (50 mcg Intravenous Given 05/15/19 0515)  sodium chloride 0.9 % bolus 1,000 mL (0 mLs Intravenous  Stopped 05/15/19 0637)  fentaNYL (SUBLIMAZE) injection 50 mcg (50 mcg Intravenous Given 05/15/19 0602)     Initial Impression / Assessment and Plan / ED Course  I have reviewed the triage vital signs and the nursing notes.  Pertinent labs & imaging results that were available during my care of the patient were reviewed by me and considered in my medical decision making (see chart for details).        4:43 AM Chest pain work-up in process.  EKG is unchanged from prior 7:03 AM Patient with continued chest pain.  D-dimer negative.  No abnormal x-ray findings.  Due to history of CAD, will admit to the hospital.  Chest pain is somewhat atypical in nature given the change in position sharp, but she reports it similar to prior anginal pain  she also reports continued headache, but no focal neuro deficits and is otherwise well-appearing will treat headache.  7:24 AM Discussed with Dr. Tamala Julian with hospitalist for admission Final Clinical Impressions(s) / ED Diagnoses   Final diagnoses:  Chest pain, rule out acute myocardial infarction  Other headache syndrome    ED Discharge Orders    None       Ripley Fraise, MD 05/15/19 0725

## 2019-05-15 NOTE — Progress Notes (Signed)
*  PRELIMINARY RESULTS* Echocardiogram 2D Echocardiogram has been performed.  Leavy Cella 05/15/2019, 3:19 PM

## 2019-05-15 NOTE — Consult Note (Signed)
Cardiology Consultation:   Patient ID: [name] MRN: [id number]; DOB: [birth year]  Admit date: 05/15/2019 Date of Consult: 05/15/2019  Primary Care Provider: [name], MD Primary Cardiologist: [name], MD  Primary Electrophysiologist:  None    Patient Profile:   [name] is a 73 y.o. female with a hx of CAD s/p MI, hypertension, hyperlipidemia, diabetes, tobacco abuse and anxiet who is being seen today for the evaluation of chest pain at the request of Dr. Tamala Julian.  History of Present Illness:   [name] was cleaning her church this AM and developed a headache.  She came home and laid down, at which time she also started to have chest pain.  It was sharp and left sided.  It did not radiate.  The episode was associated with nausea, diaphoresis, and palpitations.  She felt as though she would pass out.  Nitroglycerin did not help.  EMS was called where she had more nitroglycerin and aspirin.  Her blood pressure reportedly got low.  In the ED vital signs were stable.  High-sensitivity troponin was negative x2.  Echocardiogram revealed LVEF 45 to 50% with lateral wall hypokinesis.  It was otherwise unremarkable.  Cardiology was consulted for further evaluation.  She continues to report 6/10 chest pain that is not reproducible with palpation.  [name] had a [facility] Call 02/2016 that revealed LVEF 37% with evidence of prior basal to mid anterolateral MI.  Left heart catheterization 07/2016 revealed LVEF 45 to 50% with 30 to 40% stenoses in D1, proximal LAD, and mid RCA.  She also has a history of coronary vasospasm.   Heart Pathway Score:     Past Medical History:  Diagnosis Date  . Anemia   . Anginal pain ([place])    last cp was last year  . Anxiety   . Arthritis   . Blood transfusion   . Chest pain 02/04/2016  . Coronary artery disease   . Coronary artery spasm, wth continued episodes of chest pain.  09/26/2013  . Coronary vasospasm ([place])   .  Diabetes mellitus without complication ([place])    type II  . GERD (gastroesophageal reflux disease)   . Headache   . Hiatal hernia   . Hypercholesteremia   . Hypertension   . NSTEMI (non-ST elevated myocardial infarction) ([place]) not sure  . NSVT (nonsustained ventricular tachycardia) ([place]) 09/26/2013  . Panic attack     Past Surgical History:  Procedure Laterality Date  . ABDOMINAL HYSTERECTOMY     PARTIAL HYSTERECTOMY  . breast     right, tumor benign  . BREAST EXCISIONAL BIOPSY Right   . CARDIAC CATHETERIZATION  2014  . CARDIAC CATHETERIZATION  2012  . CARDIAC CATHETERIZATION N/A 07/22/2016   Procedure: Left Heart Cath and Coronary Angiography;  Surgeon: Belva Crome, MD;  Location: [place] [facility];  Service: Cardiovascular;  Laterality: N/A;  . cardiac stent  2012   to RCA  . CHOLECYSTECTOMY N/A 10/02/2017   Procedure: LAPAROSCOPIC CHOLECYSTECTOMY;  Surgeon: Ralene Ok, MD;  Location: [place];  Service: General;  Laterality: N/A;  . CORONARY ANGIOPLASTY    . ESOPHAGEAL MANOMETRY N/A 10/29/2016   Procedure: ESOPHAGEAL MANOMETRY (EM);  Surgeon: Garlan Fair, MD;  Location: [facility];  Service: Endoscopy;  Laterality: N/A;  . ESOPHAGOGASTRODUODENOSCOPY (EGD) WITH PROPOFOL N/A 10/28/2016   Procedure: ESOPHAGOGASTRODUODENOSCOPY (EGD) WITH PROPOFOL;  Surgeon: Garlan Fair, MD;  Location: [facility];  Service: Endoscopy;  Laterality: N/A;  . EXCISION OF SKIN TAG  N/A 07/29/2017   Procedure: EXCISION OF PERIANAL  SKIN TAG;  Surgeon: Ralene Ok, MD;  Location: [facility];  Service: General;  Laterality: N/A;  . KNEE SURGERY  left  . left ear surgery     for bad cut  . LEFT HEART CATHETERIZATION WITH CORONARY ANGIOGRAM N/A 02/22/2013   Procedure: LEFT HEART CATHETERIZATION WITH CORONARY ANGIOGRAM;  Surgeon: [name], MD;  Location[facility];  Service: Cardiovascular;  Laterality: N/A;  . SHOULDER SURGERY Bilateral    rotator cuff     Home Medications:   Prior to Admission medications   Medication Sig Start Date End Date Taking? Authorizing Provider  acetaminophen (TYLENOL) 500 MG tablet Take 500 mg by mouth daily as needed for moderate pain or headache.   Yes [provider]  amLODipine (NORVASC) 2.5 MG tablet Take 1 tablet (2.5 mg total) by mouth daily. 01/25/17  Yes Eugenie Filler, MD  aspirin EC 81 MG tablet Take 81 mg by mouth daily.   Yes [provider]  buPROPion (WELLBUTRIN SR) 150 MG 12 hr tablet Take 150 mg by mouth daily. For smoking cessation   Yes [provider]  calcium-vitamin D (OSCAL WITH D) 500-200 MG-UNIT per tablet Take 1 tablet by mouth daily.    Yes [provider]  clonazePAM (KLONOPIN) 0.5 MG tablet Take 0.5 mg by mouth 2 (two) times daily as needed for anxiety.    Yes [provider]  isosorbide mononitrate (IMDUR) 60 MG 24 hr tablet TAKE 1 AND 1/2 TABLETS BY MOUTH DAILY. NEED TO SCHEDULE YEARLY APPOINTMENT FOR FURTHER REFILLS Patient taking differently: Take 90 mg by mouth daily.  07/05/18  Yes Belva Crome, MD  metFORMIN (GLUCOPHAGE) 500 MG tablet Take 500 mg by mouth 2 (two) times daily.   Yes [provider]  nitroGLYCERIN (NITROSTAT) 0.4 MG SL tablet PLACE 1 TABLET UNDER TONGUE AS NEEDED FOR CHEST PAIN EVERY 5 MINUTES UP TO 3 DOSES THEN SEEK MEDICAL ATTENTION. Please make overdue appt. 1st attempt Patient taking differently: Place 0.4 mg under the tongue every 5 (five) minutes as needed for chest pain.  02/18/19  Yes Belva Crome, MD  pantoprazole (PROTONIX) 40 MG tablet Take 1 tablet (40 mg total) by mouth 2 (two) times daily. 04/20/14  Yes Isaiah Serge, NP  RANEXA 500 MG 12 hr tablet TAKE 1 TABLET(500 MG) BY MOUTH TWICE DAILY Patient taking differently: Take 500 mg by mouth 2 (two) times daily.  12/31/16  Yes Belva Crome, MD  ranitidine (ZANTAC) 150 MG tablet Take 150 mg by mouth 2 (two) times daily.    Yes [provider]  rosuvastatin  (CRESTOR) 5 MG tablet Take 1 tablet (5 mg total) by mouth daily at 6 PM. 01/24/17  Yes Eugenie Filler, MD    Inpatient Medications: Scheduled Meds: . aspirin EC  81 mg Oral Daily  . buPROPion  150 mg Oral Daily  . enoxaparin (LOVENOX) injection  40 mg Subcutaneous Q24H  . isosorbide mononitrate  90 mg Oral Daily  . pantoprazole  40 mg Oral BID  . ranolazine  500 mg Oral BID  . rosuvastatin  5 mg Oral q1800  . sodium chloride flush  3 mL Intravenous Once   Continuous Infusions:  PRN Meds: acetaminophen, alum & mag hydroxide-simeth **AND** lidocaine, clonazePAM, morphine injection, nitroGLYCERIN, ondansetron (ZOFRAN) IV  Allergies:   No Known Allergies  Social History:   Social History   Socioeconomic History  .  Marital status: Married    Spouse name: [name]  . Number of children: 0  . Years of education: Not on file  . Highest education level: Not on file  Occupational History  . Not on file  Social Needs  . Financial resource strain: Not on file  . Food insecurity    Worry: Not on file    Inability: Not on file  . Transportation needs    Medical: Not on file    Non-medical: Not on file  Tobacco Use  . Smoking status: Current Some Day Smoker    Packs/day: 1.00    Years: 40.00    Pack years: 40.00    Types: Cigarettes    Last attempt to quit: 01/10/2016    Years since quitting: 3.3  . Smokeless tobacco: Never Used  . Tobacco comment: occasional   Substance and Sexual Activity  . Alcohol use: Yes    Alcohol/week: 1.0 standard drinks    Types: 1 Cans of beer per week    Comment: occasional beer  . Drug use: No  . Sexual activity: Never  Lifestyle  . Physical activity    Days per week: Not on file    Minutes per session: Not on file  . Stress: Not on file  Relationships  . Social Herbalist on phone: Not on file    Gets together: Not on file    Attends religious service: Not on file    Active member of club or organization: Not on file     Attends meetings of clubs or organizations: Not on file    Relationship status: Not on file  . Intimate partner violence    Fear of current or ex partner: Not on file    Emotionally abused: Not on file    Physically abused: Not on file    Forced sexual activity: Not on file  Other Topics Concern  . Not on file  Social History Narrative   Lives with husband. Ambulates independently.    Family History:    Family History  Problem Relation Age of Onset  . Heart failure Mother   . Stroke Sister   . Heart attack Other      ROS:  Please see the history of present illness.   All other ROS reviewed and negative.     Physical Exam/Data:   Vitals:   05/15/19 1415 05/15/19 1442 05/15/19 1608 05/15/19 1618  BP:  (!) 106/59 (!) 90/51 (!) 93/59  Pulse: 75 71 71 73  Resp: 15 17    Temp:  98.5 F (36.9 C) 98.7 F (37.1 C)   TempSrc:  Oral Oral   SpO2: 100% 100% 95% 94%  Weight:      Height:        Intake/Output Summary (Last 24 hours) at 05/15/2019 1627 Last data filed at 05/15/2019 1441 Gross per 24 hour  Intake -  Output 575 ml  Net -575 ml   Last 3 Weights 05/15/2019 05/02/2019 02/17/2019  Weight (lbs) 116 lb 116 lb 134 lb  Weight (kg) 52.617 kg 52.617 kg 60.782 kg     VS:  BP 106/65 (BP Location: Right Arm)   Pulse 69   Temp (!) 97.3 F (36.3 C) (Oral)   Resp 15   Ht 4\' 11"  (1.499 m)   Wt 52.6 kg   SpO2 95%   BMI 23.43 kg/m  , BMI Body mass index is 23.43 kg/m. GENERAL:  Well appearing HEENT: Pupils equal round  and reactive, fundi not visualized, oral mucosa unremarkable NECK:  No jugular venous distention, waveform within normal limits, carotid upstroke brisk and symmetric, no bruits LUNGS:  Clear to auscultation bilaterally HEART:  RRR.  PMI not displaced or sustained,S1 and S2 within normal limits, no S3, no S4, no clicks, no rubs, no murmurs ABD:  Flat, positive bowel sounds normal in frequency in pitch, no bruits, no rebound, no guarding, no midline pulsatile  mass, no hepatomegaly, no splenomegaly EXT:  2 plus pulses throughout, no edema, no cyanosis no clubbing SKIN:  No rashes no nodules NEURO:  Cranial nerves II through XII grossly intact, motor grossly intact throughout PSYCH:  Cognitively intact, oriented to person place and time   EKG:  The EKG was personally reviewed and demonstrates:  Sinus rhythm.  Rate 61 bpm.   Telemetry:  Telemetry was personally reviewed and demonstrates:  Sinus rhythm.  No events.   Relevant CV Studies:  Echo 05/15/19:  IMPRESSIONS    1. Left ventricular ejection fraction, by visual estimation, is 45 to 50%. The left ventricle has mildly decreased function. Normal left ventricular size. There is no left ventricular hypertrophy.  2. Entire lateral wall is abnormal.  3. Elevated left ventricular end-diastolic pressure.  4. Left ventricular diastolic Doppler parameters are consistent with impaired relaxation pattern of LV diastolic filling.  5. Global right ventricle has normal systolic function.The right ventricular size is normal. No increase in right ventricular wall thickness.  6. Left atrial size was normal.  7. Right atrial size was normal.  8. The mitral valve is normal in structure. Trace mitral valve regurgitation. No evidence of mitral stenosis.  9. The tricuspid valve is normal in structure. Tricuspid valve regurgitation is trivial. 10. The aortic valve is normal in structure. Aortic valve regurgitation was not visualized by color flow Doppler. Structurally normal aortic valve, with no evidence of sclerosis or stenosis. 11. The pulmonic valve was normal in structure. Pulmonic valve regurgitation is not visualized by color flow Doppler. 12. Normal pulmonary artery systolic pressure. 13. The inferior vena cava is normal in size with <50% respiratory variability, suggesting right atrial pressure of 8 mmHg.  LHC 07/22/16:  The left ventricular ejection fraction is 45-50% by visual estimate.  Ost 1st  Diag lesion, 40 %stenosed.  Prox LAD lesion, 30 %stenosed.  Mid RCA lesion, 40 %stenosed.    Diffuse 30-40% narrowing in the mid right coronary/in-stent restenosis.  Otherwise widely patent coronaries with the exception of a 40-50% ostial diagonal 1.  Overall low normal LV function with EF of 50%.  Intense radial/brachial spasm during the case requiring that catheter size be downgraded to 4 [place].  Lexiscan Myoview 02/11/16:  Nuclear stress EF: 37%.  The left ventricular ejection fraction is moderately decreased (30-44%).  There was no ST segment deviation noted during stress.  Defect 1: There is a large defect of severe severity present in the basal anterolateral and mid anterolateral location.  Findings consistent with prior myocardial infarction.  This is an intermediate risk study.   Intermediate risk stress nuclear study with large prior lateral infarct; no significant ischemia; EF 37 with global hypokinesis and akinesis of the lateral wall; mild LVE; interpreted as intermediate risk due to reduced LV function.   Laboratory Data:  High Sensitivity Troponin:   Recent Labs  Lab 05/15/19 0412 05/15/19 0613  TROPONINIHS 4 4     Chemistry Recent Labs  Lab 05/15/19 0412  NA 139  K 4.0  CL 108  CO2 21*  GLUCOSE 121*  BUN 15  CREATININE 0.66  CALCIUM 9.2  GFRNONAA >60  GFRAA >60  ANIONGAP 10    No results for input(s): PROT, ALBUMIN, AST, ALT, ALKPHOS, BILITOT in the last 168 hours. Hematology Recent Labs  Lab 05/15/19 0412  WBC 5.8  RBC 3.77*  HGB 10.2*  HCT 32.2*  MCV 85.4  MCH 27.1  MCHC 31.7  RDW 17.4*  PLT 334   BNPNo results for input(s): BNP, PROBNP in the last 168 hours.  DDimer  Recent Labs  Lab 05/15/19 0412  DDIMER 0.30     Radiology/Studies:  Dg Chest 2 View  Result Date: 05/15/2019 CLINICAL DATA:  Chest pain EXAM: CHEST - 2 VIEW COMPARISON:  01/23/2017 FINDINGS: The heart size and mediastinal contours are within normal  limits. Both lungs are clear. The visualized skeletal structures are unremarkable. IMPRESSION: No active cardiopulmonary disease. Electronically Signed   By: Kathreen Devoid   On: 05/15/2019 05:05    Assessment and Plan:   # CAD s/p PCI: # Atypical chest pain: [name] chest pain is non-exertional.  EKG is unremarkable, hs-troponin is negative, and echo is unchanged from prior.  I suspect that her chest pain is non-cardiac.  SHe has a history of coronary spasm, but with negative cardiac enzymes and ongoing chest pain, I suspect this is not the cause at this time.  We cannot titrate her antianginals due to hypotension.  Continue amlodipine, aspirin, Imdur, rosuvastatin, and Ranexa. No stress testing at this time given the above.  We will arrange follow up. If she continues to have discomfort then stress testing can be arranged as an outpatient.   # Hypertension:  BP controlled on home amlodipine and Imdur.    # Hyperlipidemia:Continue rosuvastatin.   # Tobacco abuse: Encouraged cessation.    [facility] will sign off.   Medication Recommendations:  none Other recommendations (labs, testing, etc):  n/a Follow up as an outpatient:  n/a  For questions or updates, please contact [facility] Please consult [email or web address] for contact info under     Signed, Skeet Latch, MD  05/15/2019 4:27 PM

## 2019-05-15 NOTE — ED Notes (Signed)
ED Provider at bedside. 

## 2019-05-15 NOTE — Progress Notes (Signed)
Patient complains of prickling chest pain. Patient vitals are 93/49 BP, 76 HR, and 94 O2. Dr. Tamala Julian was notified and informed he will let cardiology know.

## 2019-05-16 DIAGNOSIS — F329 Major depressive disorder, single episode, unspecified: Secondary | ICD-10-CM | POA: Diagnosis not present

## 2019-05-16 DIAGNOSIS — I25111 Atherosclerotic heart disease of native coronary artery with angina pectoris with documented spasm: Secondary | ICD-10-CM | POA: Diagnosis not present

## 2019-05-16 DIAGNOSIS — R079 Chest pain, unspecified: Secondary | ICD-10-CM | POA: Diagnosis not present

## 2019-05-16 DIAGNOSIS — K219 Gastro-esophageal reflux disease without esophagitis: Secondary | ICD-10-CM

## 2019-05-16 LAB — GLUCOSE, CAPILLARY: Glucose-Capillary: 84 mg/dL (ref 70–99)

## 2019-05-16 MED ORDER — METOCLOPRAMIDE HCL 5 MG/ML IJ SOLN
5.0000 mg | Freq: Once | INTRAMUSCULAR | Status: AC
  Administered 2019-05-16: 5 mg via INTRAVENOUS
  Filled 2019-05-16: qty 2

## 2019-05-16 MED ORDER — DIPHENHYDRAMINE HCL 50 MG/ML IJ SOLN
12.5000 mg | Freq: Once | INTRAMUSCULAR | Status: AC
  Administered 2019-05-16: 12.5 mg via INTRAVENOUS
  Filled 2019-05-16: qty 1

## 2019-05-16 NOTE — Care Management Obs Status (Signed)
[place] NOTIFICATION   Patient Details  Name: [name] MRN: [id number] Date of Birth: 1946-06-14   Medicare Observation Status Notification Given:  Yes    Carles Collet, RN 05/16/2019, 8:52 AM

## 2019-05-16 NOTE — Progress Notes (Signed)
Pt discharge instructions reviewed with pt. Pt verbalizes understanding. Pt aware there were no medication changes. Pt belongings with pt. Pt is not in distress. Pt's husband is driving her home. Pt discharged via wheelchair.

## 2019-05-16 NOTE — Discharge Summary (Signed)
Physician Discharge Summary  [name] [id number] DOB: 03-15-1946 DOA: 05/15/2019  PCP: Jillian Low, MD  Admit date: 05/15/2019 Discharge date: 05/16/2019  Admitted From: home  Disposition:  home   Recommendations for Outpatient Follow-up:  1. F/u on chest pain  Discharge Condition:  stable   CODE STATUS:  Full code   Diet recommendation:  Heart healthy Consultations:  cardiology    Discharge Diagnoses:  Principal Problem:   Chest pain Active Problems:   GERD (gastroesophageal reflux disease)   Headache   HTN (hypertension)   Depression   Dyslipidemia   Coronary artery disease   S/P angioplasty with stent      Brief Summary: [name] is a 73 y.o. female with medical history significant of hypertension, HLD, CAD status post stent with angina, DM type II, anxiety, and tobacco use; who presents with complaints of chest pain.  Symptoms started yesterday with complaints of a frontal headache.  She went to clinic church and when she came back within an hour or so developed severe sharp left-sided chest pain that radiated to the center of her chest.  Denies tenderness to palpation or change in pain with movement or position.  Associated symptoms included nausea, diaphoresis, palpitations, and felt as though she would pass out. Denies having any loss of consciousness, leg swelling, weight gain, abdominal pain, vomiting, cough, or fever.  She took all of her morning medications along with nitroglycerin without complete relief of symptoms.  Pain was initially in 9/10 on the pain scale and since being in the hospital and is down to 6/10.  In route with EMS she was given 2 aspirin and nitroglycerin but her blood pressure dropped.  Previous history of chest pain complaints in the past.  Followed by Jillian. Daneen [name] of cardiology in the outpatient setting.  Her last heart cath appears to have been in 07/2016 which revealed EF of 45 to 50%, diffuse 30 to 40% narrowing of the mid  right coronary artery/in-stent restenosis, 40 to 50% obstruction on diagonal 1, and otherwise widely patent coronaries.  She was recommended to continue on medical therapy at that time.  Hospital Course:  Chest pain- h/o CAD with stents- GERD - high sensitivity Troponin neg x 2 - ECHO > EF 45-50 % with lateral wall motion abnormality - evaluated by cardiology, [name] who did not recommend further work up unless she continued to have pain- she has not had further pain-  - her pain may be GI related- she is advised to continue her GI plan> BID PPI and H2 blocker, bland diet, sleep on incline and not eat 1 hr before bed  Frontal headache - she received Benadryl and Reglan in ED and has asked for it again- have ordered it  Anemia - Hb stable  Essential HTN - cont Amlodipine  DM2 - A1c 6.4 - cont Metformin  Tobacco abuse - down to 3 cigarettes a day- encourage smoking cessation    Discharge Exam: Vitals:   05/16/19 0607 05/16/19 0831  BP: 119/69 114/90  Pulse: 60 67  Resp: 18   Temp: 98.1 F (36.7 C) 98 F (36.7 C)  SpO2: 98% 100%   Vitals:   05/15/19 1916 05/16/19 0207 05/16/19 0607 05/16/19 0831  BP: 110/63 (!) 85/54 119/69 114/90  Pulse: 78 71 60 67  Resp:  18 18   Temp: 98.2 F (36.8 C) (!) 97.3 F (36.3 C) 98.1 F (36.7 C) 98 F (36.7 C)  TempSrc: Oral Oral Oral Oral  SpO2: 99% 98% 98% 100%  Weight:  53.5 kg    Height:        General: Pt is alert, awake, not in acute distress Cardiovascular: RRR, S1/S2 +, no rubs, no gallops Respiratory: CTA bilaterally, no wheezing, no rhonchi Abdominal: Soft, NT, ND, bowel sounds + Extremities: no edema, no cyanosis   Discharge Instructions  Discharge Instructions    Diet - [name] sodium heart healthy   Complete by: As directed    Increase activity slowly   Complete by: As directed      Allergies as of 05/16/2019   No Known Allergies     Medication List    TAKE these medications   acetaminophen 500 MG  tablet Commonly known as: TYLENOL Take 500 mg by mouth daily as needed for moderate pain or headache.   amLODipine 2.5 MG tablet Commonly known as: NORVASC Take 1 tablet (2.5 mg total) by mouth daily.   aspirin EC 81 MG tablet Take 81 mg by mouth daily.   buPROPion 150 MG 12 hr tablet Commonly known as: WELLBUTRIN SR Take 150 mg by mouth daily. For smoking cessation   calcium-vitamin D 500-200 MG-UNIT tablet Commonly known as: OSCAL WITH D Take 1 tablet by mouth daily.   clonazePAM 0.5 MG tablet Commonly known as: KLONOPIN Take 0.5 mg by mouth 2 (two) times daily as needed for anxiety.   isosorbide mononitrate 60 MG 24 hr tablet Commonly known as: IMDUR TAKE 1 AND 1/2 TABLETS BY MOUTH DAILY. NEED TO SCHEDULE YEARLY APPOINTMENT FOR FURTHER REFILLS What changed: See the new instructions.   metFORMIN 500 MG tablet Commonly known as: GLUCOPHAGE Take 500 mg by mouth 2 (two) times daily.   nitroGLYCERIN 0.4 MG SL tablet Commonly known as: NITROSTAT PLACE 1 TABLET UNDER TONGUE AS NEEDED FOR CHEST PAIN EVERY 5 MINUTES UP TO 3 DOSES THEN SEEK MEDICAL ATTENTION. Please make overdue appt. 1st attempt What changed:   how much to take  how to take this  when to take this  reasons to take this  additional instructions   pantoprazole 40 MG tablet Commonly known as: PROTONIX Take 1 tablet (40 mg total) by mouth 2 (two) times daily.   Ranexa 500 MG 12 hr tablet Generic drug: ranolazine TAKE 1 TABLET(500 MG) BY MOUTH TWICE DAILY What changed: See the new instructions.   ranitidine 150 MG tablet Commonly known as: ZANTAC Take 150 mg by mouth 2 (two) times daily.   rosuvastatin 5 MG tablet Commonly known as: CRESTOR Take 1 tablet (5 mg total) by mouth daily at 6 PM.      Follow-up Information    Jillian Low, MD. Go on 05/30/2019.   Specialty: Internal Medicine Why: @10 :30am Contact information: [address] [place]  [id number] [phone]          No Known Allergies   Procedures/Studies: 2 D ECHO 1. Left ventricular ejection fraction, by visual estimation, is 45 to 50%. The left ventricle has mildly decreased function. Normal left ventricular size. There is no left ventricular hypertrophy.  2. Entire lateral wall is abnormal.  3. Elevated left ventricular end-diastolic pressure.  4. Left ventricular diastolic Doppler parameters are consistent with impaired relaxation pattern of LV diastolic filling.  5. Global right ventricle has normal systolic function.The right ventricular size is normal. No increase in right ventricular wall thickness.  6. Left atrial size was normal.  7. Right atrial size was normal.  8. The mitral valve is normal in  structure. Trace mitral valve regurgitation. No evidence of mitral stenosis.  9. The tricuspid valve is normal in structure. Tricuspid valve regurgitation is trivial. 10. The aortic valve is normal in structure. Aortic valve regurgitation was not visualized by color flow Doppler. Structurally normal aortic valve, with no evidence of sclerosis or stenosis. 11. The pulmonic valve was normal in structure. Pulmonic valve regurgitation is not visualized by color flow Doppler. 12. Normal pulmonary artery systolic pressure. 13. The inferior vena cava is normal in size with <50% respiratory variability, suggesting right atrial pressure of 8 mmHg.   Dg Chest 2 View  Result Date: 05/15/2019 CLINICAL DATA:  Chest pain EXAM: CHEST - 2 VIEW COMPARISON:  01/23/2017 FINDINGS: The heart size and mediastinal contours are within normal limits. Both lungs are clear. The visualized skeletal structures are unremarkable. IMPRESSION: No active cardiopulmonary disease. Electronically Signed   By: Kathreen Devoid   On: 05/15/2019 05:05   Xr Shoulder Right  Result Date: 05/02/2019 2 view radiographs of the right shoulder shows some arthritic changes the joint space is congruent.    The  results of significant diagnostics from this hospitalization (including imaging, microbiology, ancillary and laboratory) are listed below for reference.     Microbiology: Recent Results (from the past 240 hour(s))  SARS Coronavirus 2 [facility] order, Performed in [facility] hospital lab) Nasopharyngeal Nasopharyngeal Swab     Status: None   Collection Time: 05/15/19  5:16 AM   Specimen: Nasopharyngeal Swab  Result Value Ref Range Status   SARS Coronavirus 2 NEGATIVE NEGATIVE Final    Comment: (NOTE) If result is NEGATIVE SARS-CoV-2 target nucleic acids are NOT DETECTED. The SARS-CoV-2 RNA is generally detectable in upper and lower  respiratory specimens during the acute phase of infection. The lowest  concentration of SARS-CoV-2 viral copies this assay can detect is 250  copies / mL. A negative result does not preclude SARS-CoV-2 infection  and should not be used as the sole basis for treatment or other  patient management decisions.  A negative result may occur with  improper specimen collection / handling, submission of specimen other  than nasopharyngeal swab, presence of viral mutation(s) within the  areas targeted by this assay, and inadequate number of viral copies  (<250 copies / mL). A negative result must be combined with clinical  observations, patient history, and epidemiological information. If result is POSITIVE SARS-CoV-2 target nucleic acids are DETECTED. The SARS-CoV-2 RNA is generally detectable in upper and lower  respiratory specimens dur ing the acute phase of infection.  Positive  results are indicative of active infection with SARS-CoV-2.  Clinical  correlation with patient history and other diagnostic information is  necessary to determine patient infection status.  Positive results do  not rule out bacterial infection or co-infection with other viruses. If result is PRESUMPTIVE POSTIVE SARS-CoV-2 nucleic acids MAY BE PRESENT.   A presumptive positive result  was obtained on the submitted specimen  and confirmed on repeat testing.  While 2019 novel coronavirus  (SARS-CoV-2) nucleic acids may be present in the submitted sample  additional confirmatory testing may be necessary for epidemiological  and / or clinical management purposes  to differentiate between  SARS-CoV-2 and other Sarbecovirus currently known to infect humans.  If clinically indicated additional testing with an alternate test  methodology [phone]) is advised. The SARS-CoV-2 RNA is generally  detectable in upper and lower respiratory sp ecimens during the acute  phase of infection. The expected result is Negative. Fact Sheet for  Patients:  StrictlyIdeas.no Fact Sheet for Healthcare Providers: BankingDealers.co.za This test is not yet approved or cleared by the [place] FDA and has been authorized for detection and/or diagnosis of SARS-CoV-2 by FDA under an Emergency Use Authorization (EUA).  This EUA will remain in effect (meaning this test can be used) for the duration of the COVID-19 declaration under Section 564(b)(1) of the Act, 21 U.S.C. section 360bbb-3(b)(1), unless the authorization is terminated or revoked sooner. Performed at [facility], [place] [address], [place], [place] [address]      Labs: BNP (last 3 results) No results for input(s): BNP in the last 8760 hours. Basic Metabolic Panel: Recent Labs  Lab 05/15/19 0412  NA 139  K 4.0  CL 108  CO2 21*  GLUCOSE 121*  BUN 15  CREATININE 0.66  CALCIUM 9.2   Liver Function Tests: No results for input(s): AST, ALT, ALKPHOS, BILITOT, PROT, ALBUMIN in the last 168 hours. No results for input(s): LIPASE, AMYLASE in the last 168 hours. No results for input(s): AMMONIA in the last 168 hours. CBC: Recent Labs  Lab 05/15/19 0412  WBC 5.8  HGB 10.2*  HCT 32.2*  MCV 85.4  PLT 334   Cardiac Enzymes: No results for input(s): CKTOTAL, CKMB, CKMBINDEX,  TROPONINI in the last 168 hours. BNP: Invalid input(s): POCBNP CBG: Recent Labs  Lab 05/15/19 1635 05/15/19 2124 05/16/19 0717  GLUCAP 103* 97 84   D-Dimer Recent Labs    05/15/19 0412  DDIMER 0.30   Hgb A1c No results for input(s): HGBA1C in the last 72 hours. Lipid Profile No results for input(s): CHOL, HDL, LDLCALC, TRIG, CHOLHDL, LDLDIRECT in the last 72 hours. Thyroid function studies No results for input(s): TSH, T4TOTAL, T3FREE, THYROIDAB in the last 72 hours.  Invalid input(s): FREET3 Anemia work up No results for input(s): VITAMINB12, FOLATE, FERRITIN, TIBC, IRON, RETICCTPCT in the last 72 hours. Urinalysis    Component Value Date/Time   COLORURINE YELLOW 03/09/2013 1215   APPEARANCEUR HAZY (A) 03/09/2013 1215   LABSPEC 1.027 03/09/2013 1215   PHURINE 5.5 03/09/2013 1215   GLUCOSEU NEGATIVE 03/09/2013 1215   HGBUR NEGATIVE 03/09/2013 1215   BILIRUBINUR SMALL (A) 03/09/2013 1215   KETONESUR 15 (A) 03/09/2013 1215   PROTEINUR NEGATIVE 03/09/2013 1215   UROBILINOGEN 1.0 03/09/2013 1215   NITRITE NEGATIVE 03/09/2013 1215   LEUKOCYTESUR SMALL (A) 03/09/2013 1215   Sepsis Labs Invalid input(s): PROCALCITONIN,  WBC,  LACTICIDVEN Microbiology Recent Results (from the past 240 hour(s))  SARS Coronavirus 2 [facility] order, Performed in [facility] hospital lab) Nasopharyngeal Nasopharyngeal Swab     Status: None   Collection Time: 05/15/19  5:16 AM   Specimen: Nasopharyngeal Swab  Result Value Ref Range Status   SARS Coronavirus 2 NEGATIVE NEGATIVE Final    Comment: (NOTE) If result is NEGATIVE SARS-CoV-2 target nucleic acids are NOT DETECTED. The SARS-CoV-2 RNA is generally detectable in upper and lower  respiratory specimens during the acute phase of infection. The lowest  concentration of SARS-CoV-2 viral copies this assay can detect is 250  copies / mL. A negative result does not preclude SARS-CoV-2 infection  and should not be used as the sole basis for  treatment or other  patient management decisions.  A negative result may occur with  improper specimen collection / handling, submission of specimen other  than nasopharyngeal swab, presence of viral mutation(s) within the  areas targeted by this assay, and inadequate number of viral copies  (<250 copies /  mL). A negative result must be combined with clinical  observations, patient history, and epidemiological information. If result is POSITIVE SARS-CoV-2 target nucleic acids are DETECTED. The SARS-CoV-2 RNA is generally detectable in upper and lower  respiratory specimens dur ing the acute phase of infection.  Positive  results are indicative of active infection with SARS-CoV-2.  Clinical  correlation with patient history and other diagnostic information is  necessary to determine patient infection status.  Positive results do  not rule out bacterial infection or co-infection with other viruses. If result is PRESUMPTIVE POSTIVE SARS-CoV-2 nucleic acids MAY BE PRESENT.   A presumptive positive result was obtained on the submitted specimen  and confirmed on repeat testing.  While 2019 novel coronavirus  (SARS-CoV-2) nucleic acids may be present in the submitted sample  additional confirmatory testing may be necessary for epidemiological  and / or clinical management purposes  to differentiate between  SARS-CoV-2 and other Sarbecovirus currently known to infect humans.  If clinically indicated additional testing with an alternate test  methodology [phone]) is advised. The SARS-CoV-2 RNA is generally  detectable in upper and lower respiratory sp ecimens during the acute  phase of infection. The expected result is Negative. Fact Sheet for Patients:  StrictlyIdeas.no Fact Sheet for Healthcare Providers: BankingDealers.co.za This test is not yet approved or cleared by the [place] FDA and has been authorized for detection and/or  diagnosis of SARS-CoV-2 by FDA under an Emergency Use Authorization (EUA).  This EUA will remain in effect (meaning this test can be used) for the duration of the COVID-19 declaration under Section 564(b)(1) of the Act, 21 U.S.C. section 360bbb-3(b)(1), unless the authorization is terminated or revoked sooner. Performed at [facility], [place] [address], [place], [place] [address]      Time coordinating discharge in minutes: 79  SIGNED:   Debbe Odea, MD  [facility] 05/16/2019, 11:29 AM Pager   If 7PM-7AM, please contact night-coverage [email or web address] Password [email or web address]

## 2019-05-23 ENCOUNTER — Ambulatory Visit
Admission: RE | Admit: 2019-05-23 | Discharge: 2019-05-23 | Disposition: A | Discharge status: Home / Self Care | Payer: Medicare Other | Source: Ambulatory Visit | Attending: Orthopedic Surgery | Admitting: Orthopedic Surgery

## 2019-05-23 ENCOUNTER — Other Ambulatory Visit: Payer: Self-pay

## 2019-05-23 DIAGNOSIS — G8929 Other chronic pain: Secondary | ICD-10-CM

## 2019-05-30 ENCOUNTER — Encounter: Payer: Self-pay | Admitting: Orthopedic Surgery

## 2019-05-30 ENCOUNTER — Ambulatory Visit (INDEPENDENT_AMBULATORY_CARE_PROVIDER_SITE_OTHER): Payer: Medicare Other | Admitting: Orthopedic Surgery

## 2019-05-30 ENCOUNTER — Other Ambulatory Visit: Payer: Self-pay

## 2019-05-30 VITALS — Ht 59.0 in | Wt 117.9 lb

## 2019-05-30 DIAGNOSIS — M7541 Impingement syndrome of right shoulder: Secondary | ICD-10-CM | POA: Diagnosis not present

## 2019-06-08 ENCOUNTER — Other Ambulatory Visit: Payer: Self-pay | Admitting: Specialist

## 2019-06-08 DIAGNOSIS — R519 Headache, unspecified: Secondary | ICD-10-CM

## 2019-06-13 ENCOUNTER — Ambulatory Visit: Payer: Medicare Other | Admitting: Nurse Practitioner

## 2019-06-21 ENCOUNTER — Encounter: Payer: Self-pay | Admitting: Orthopedic Surgery

## 2019-06-21 NOTE — Progress Notes (Signed)
Office Visit Note   Patient: [name]           Date of Birth: 10/15/1945           MRN: [id number] Visit Date: 05/30/2019              Requested by: [name], MD [place] [address],  [place] [address] PCP: [name], MD  Chief Complaint  Patient presents with  . Right Shoulder - Follow-up    MRI Review       HPI: Patient is a 73 year old woman who presents in follow-up status post MRI scan of the right shoulder.  Patient had a previous injection back in September.  She states she still has pain in her shoulder she states she did have some temporary relief with the injection.  Patient states she is still smoking.  Assessment & Plan: Visit Diagnoses:  1. Impingement syndrome of right shoulder     Plan: Recommended internal and external rotation strengthening scapular stabilization.  Discussed that formalized physical therapy could be helpful she states she is not interested in formalized physical therapy at this time.  Discussed possibility of arthroscopic debridement, however with the osteoarthritis of the glenohumeral joint arthroscopy would probably not provide much relief.  Follow-Up Instructions: Return in about 2 months (around 07/30/2019).   Ortho Exam  Patient is alert, oriented, no adenopathy, well-dressed, normal affect, normal respiratory effort. Examination patient has abduction of flexion to 70 degrees she has pain with Neer and Hawkins impingement test.  She has pain to palpation of the biceps tendon.  Review of the MRI scan shows no full-thickness tears she does have some irregularities of the rotator cuff.  She has osteoarthritis of the glenohumeral joint.  Imaging: No results found. No images are attached to the encounter.  Labs: Lab Results  Component Value Date   HGBA1C 6.4 (H) 10/01/2017   HGBA1C 6.7 (H) 07/29/2017   HGBA1C 6.3 (H) 04/19/2014   ESRSEDRATE 9 08/29/2007   REPTSTATUS 03/10/2013 FINAL 03/09/2013   CULT NO GROWTH 03/09/2013     Lab Results  Component Value Date   ALBUMIN 3.2 (L) 02/04/2016   ALBUMIN 2.8 (L) 04/19/2014   ALBUMIN 3.2 (L) 09/22/2013    Lab Results  Component Value Date   MG 2.0 01/24/2017   MG 2.1 09/21/2013   MG 2.0 03/14/2007   No results found for: VD25OH  No results found for: PREALBUMIN CBC EXTENDED Latest Ref Rng & Units 05/15/2019 10/01/2017 07/29/2017  WBC 4.0 - 10.5 K/uL 5.8 5.3 5.8  RBC 3.87 - 5.11 MIL/uL 3.77(L) 4.52 4.22  HGB 12.0 - 15.0 g/dL 10.2(L) 13.4 12.7  HCT 36.0 - 46.0 % 32.2(L) 40.4 38.5  PLT 150 - 400 K/uL 334 294 293  NEUTROABS 1.7 - 7.7 K/uL - - -  LYMPHSABS 0.7 - 4.0 K/uL - - -     Body mass index is 23.81 kg/m.  Orders:  No orders of the defined types were placed in this encounter.  No orders of the defined types were placed in this encounter.    Procedures: No procedures performed  Clinical Data: No additional findings.  ROS:  All other systems negative, except as noted in the HPI. Review of Systems  Objective: Vital Signs: Ht 4\' 11"  (1.499 m)   Wt 117 lb 14.4 oz (53.5 kg)   BMI 23.81 kg/m   Specialty Comments:  No specialty comments available.  PMFS History: Patient Active Problem List   Diagnosis  Date Noted  . Variant angina ([place]) 01/24/2017  . Chest pain 01/23/2017  . Headache 01/23/2017  . S/P angioplasty with stent 01/23/2017  . Coronary artery disease 11/24/2014  . Syncope 10/22/2014  . Hyperglycemia 04/20/2014  . H/O hiatal hernia 04/19/2014  . 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 HTN (hypertension) 12/08/2011  . GERD (gastroesophageal reflux disease) 12/08/2011  . Depression 12/08/2011  . Dyslipidemia 12/08/2011   Past Medical History:  Diagnosis Date  . Anemia   . Anginal pain ([place])    last cp was last year  . Anxiety   . Arthritis   . Blood transfusion   . Chest pain 02/04/2016  . Coronary artery disease   . Coronary artery spasm, wth continued  episodes of chest pain.  09/26/2013  . Coronary vasospasm ([place])   . Diabetes mellitus without complication ([place])    type II  . GERD (gastroesophageal reflux disease)   . Headache   . Hiatal hernia   . Hypercholesteremia   . Hypertension   . NSTEMI (non-ST elevated myocardial infarction) ([place]) not sure  . NSVT (nonsustained ventricular tachycardia) ([place]) 09/26/2013  . Panic attack     Family History  Problem Relation Age of Onset  . Heart failure Mother   . Stroke Sister   . Heart attack Other     Past Surgical History:  Procedure Laterality Date  . ABDOMINAL HYSTERECTOMY     PARTIAL HYSTERECTOMY  . breast     right, tumor benign  . BREAST EXCISIONAL BIOPSY Right   . CARDIAC CATHETERIZATION  2014  . CARDIAC CATHETERIZATION  2012  . CARDIAC CATHETERIZATION N/A 07/22/2016   Procedure: Left Heart Cath and Coronary Angiography;  Surgeon: Belva Crome, MD;  Location: [facility];  Service: Cardiovascular;  Laterality: N/A;  . cardiac stent  2012   to RCA  . CHOLECYSTECTOMY N/A 10/02/2017   Procedure: LAPAROSCOPIC CHOLECYSTECTOMY;  Surgeon: Ralene Ok, MD;  Location: [facility];  Service: General;  Laterality: N/A;  . CORONARY ANGIOPLASTY    . ESOPHAGEAL MANOMETRY N/A 10/29/2016   Procedure: ESOPHAGEAL MANOMETRY (EM);  Surgeon: Garlan Fair, MD;  Location: [facility];  Service: Endoscopy;  Laterality: N/A;  . ESOPHAGOGASTRODUODENOSCOPY (EGD) WITH PROPOFOL N/A 10/28/2016   Procedure: ESOPHAGOGASTRODUODENOSCOPY (EGD) WITH PROPOFOL;  Surgeon: Garlan Fair, MD;  Location: [facility];  Service: Endoscopy;  Laterality: N/A;  . EXCISION OF SKIN TAG N/A 07/29/2017   Procedure: EXCISION OF PERIANAL  SKIN TAG;  Surgeon: Ralene Ok, MD;  Location: [facility];  Service: General;  Laterality: N/A;  . KNEE SURGERY  left  . left ear surgery     for bad cut  . LEFT HEART CATHETERIZATION WITH CORONARY ANGIOGRAM N/A 02/22/2013   Procedure: LEFT HEART CATHETERIZATION WITH CORONARY  ANGIOGRAM;  Surgeon: [name], MD;  Location: [facility];  Service: Cardiovascular;  Laterality: N/A;  . SHOULDER SURGERY Bilateral    rotator cuff   Social History   Occupational History  . Not on file  Tobacco Use  . Smoking status: Current Some Day Smoker    Packs/day: 1.00    Years: 40.00    Pack years: 40.00    Types: Cigarettes    Last attempt to quit: 01/10/2016    Years since quitting: 3.4  . Smokeless tobacco: Never Used  . Tobacco comment: occasional   Substance and Sexual Activity  . Alcohol use: Yes    Alcohol/week: 1.0 standard  drinks    Types: 1 Cans of beer per week    Comment: occasional beer  . Drug use: No  . Sexual activity: Never

## 2019-06-28 ENCOUNTER — Other Ambulatory Visit: Payer: Self-pay

## 2019-06-28 ENCOUNTER — Ambulatory Visit
Admission: RE | Admit: 2019-06-28 | Discharge: 2019-06-28 | Disposition: A | Discharge status: Home / Self Care | Payer: Medicare Other | Source: Ambulatory Visit | Attending: Specialist | Admitting: Specialist

## 2019-06-28 DIAGNOSIS — R519 Headache, unspecified: Secondary | ICD-10-CM

## 2019-06-28 MED ORDER — GADOBENATE DIMEGLUMINE 529 MG/ML IV SOLN
10.0000 mL | Freq: Once | INTRAVENOUS | Status: AC | PRN
  Administered 2019-06-28: 10 mL via INTRAVENOUS

## 2019-08-01 ENCOUNTER — Ambulatory Visit: Payer: Medicare Other | Admitting: Orthopedic Surgery

## 2019-08-01 ENCOUNTER — Encounter: Payer: Self-pay | Admitting: Orthopedic Surgery

## 2019-08-01 ENCOUNTER — Other Ambulatory Visit: Payer: Self-pay

## 2019-08-01 VITALS — Ht 59.0 in | Wt 117.9 lb

## 2019-08-01 DIAGNOSIS — M7541 Impingement syndrome of right shoulder: Secondary | ICD-10-CM

## 2019-08-01 MED ORDER — METHYLPREDNISOLONE ACETATE 40 MG/ML IJ SUSP
40.0000 mg | INTRAMUSCULAR | Status: AC | PRN
  Administered 2019-08-01: 09:00:00 40 mg via INTRA_ARTICULAR

## 2019-08-01 MED ORDER — LIDOCAINE HCL 1 % IJ SOLN
5.0000 mL | INTRAMUSCULAR | Status: AC | PRN
  Administered 2019-08-01: 5 mL

## 2019-08-01 NOTE — Progress Notes (Signed)
Office Visit Note   Patient: [name]           Date of Birth: Mar 16, 1946           MRN: [id number] Visit Date: 08/01/2019              Requested by: [name], MD [facility] [employer] [place] [address],  [place] [address] PCP: [name], MD  Chief Complaint  Patient presents with  . Right Shoulder - Pain, Follow-up      HPI: This is a pleasant 73 year old woman with a history of right shoulder pain.  She has had injections in the past and done quite well she is here requesting another injection  Assessment & Plan: Visit Diagnoses: No diagnosis found.  Plan: She did have quite a bit of scar tissue with injection..  She may follow-up as needed.  Follow-Up Instructions: No follow-ups on file.   Ortho Exam  Patient is alert, oriented, no adenopathy, well-dressed, normal affect, normal respiratory effort. Right shoulder.  She has pain with resisted abduction.  Internal rotation   Imaging: No results found. No images are attached to the encounter.  Labs: Lab Results  Component Value Date   HGBA1C 6.4 (H) 10/01/2017   HGBA1C 6.7 (H) 07/29/2017   HGBA1C 6.3 (H) 04/19/2014   ESRSEDRATE 9 08/29/2007   REPTSTATUS 03/10/2013 FINAL 03/09/2013   CULT NO GROWTH 03/09/2013     Lab Results  Component Value Date   ALBUMIN 3.2 (L) 02/04/2016   ALBUMIN 2.8 (L) 04/19/2014   ALBUMIN 3.2 (L) 09/22/2013    Lab Results  Component Value Date   MG 2.0 01/24/2017   MG 2.1 09/21/2013   MG 2.0 03/14/2007   No results found for: VD25OH  No results found for: PREALBUMIN CBC EXTENDED Latest Ref Rng & Units 05/15/2019 10/01/2017 07/29/2017  WBC 4.0 - 10.5 K/uL 5.8 5.3 5.8  RBC 3.87 - 5.11 MIL/uL 3.77(L) 4.52 4.22  HGB 12.0 - 15.0 g/dL 10.2(L) 13.4 12.7  HCT 36.0 - 46.0 % 32.2(L) 40.4 38.5  PLT 150 - 400 K/uL 334 294 293  NEUTROABS 1.7 - 7.7 K/uL - - -  LYMPHSABS 0.7 - 4.0 K/uL - - -     Body mass index is 23.81 kg/m.  Orders:  No orders of the defined types  were placed in this encounter.  No orders of the defined types were placed in this encounter.    Procedures: Large Joint Inj on 08/01/2019 9:02 AM Indications: diagnostic evaluation and pain Details: 22 G 1.5 in needle, posterior approach  Arthrogram: No  Medications: 5 mL lidocaine 1 %; 40 mg methylPREDNISolone acetate 40 MG/ML Aspirate: purulent Outcome: tolerated well, no immediate complications Procedure, treatment alternatives, risks and benefits explained, specific risks discussed. Consent was given by the patient. Immediately prior to procedure a time out was called to verify the correct patient, procedure, equipment, support staff and site/side marked as required. Patient was prepped and draped in the usual sterile fashion.      Clinical Data: No additional findings.  ROS:  All other systems negative, except as noted in the HPI. Review of Systems  Objective: Vital Signs: Ht 4\' 11"  (1.499 m)   Wt 117 lb 14.4 oz (53.5 kg)   BMI 23.81 kg/m   Specialty Comments:  No specialty comments available.  PMFS History: Patient Active Problem List   Diagnosis Date Noted  . 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 Chest pain 01/23/2017  . Headache 01/23/2017  .  S/P angioplasty with stent 01/23/2017  . Coronary artery disease 11/24/2014  . Syncope 10/22/2014  . Hyperglycemia 04/20/2014  . H/O hiatal hernia 04/19/2014  . 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 HTN (hypertension) 12/08/2011  . GERD (gastroesophageal reflux disease) 12/08/2011  . Depression 12/08/2011  . Dyslipidemia 12/08/2011   Past Medical History:  Diagnosis Date  . Anemia   . Anginal pain ([place])    last cp was last year  . Anxiety   . Arthritis   . Blood transfusion   . Chest pain 02/04/2016  . Coronary artery disease   . Coronary artery spasm, wth continued episodes of chest pain.  09/26/2013  . Coronary vasospasm ([place])   . Diabetes mellitus without complication ([place])     type II  . GERD (gastroesophageal reflux disease)   . Headache   . Hiatal hernia   . Hypercholesteremia   . Hypertension   . NSTEMI (non-ST elevated myocardial infarction) ([place]) not sure  . NSVT (nonsustained ventricular tachycardia) ([place]) 09/26/2013  . Panic attack     Family History  Problem Relation Age of Onset  . Heart failure Mother   . Stroke Sister   . Heart attack Other     Past Surgical History:  Procedure Laterality Date  . ABDOMINAL HYSTERECTOMY     PARTIAL HYSTERECTOMY  . breast     right, tumor benign  . BREAST EXCISIONAL BIOPSY Right   . CARDIAC CATHETERIZATION  2014  . CARDIAC CATHETERIZATION  2012  . CARDIAC CATHETERIZATION N/A 07/22/2016   Procedure: Left Heart Cath and Coronary Angiography;  Surgeon: Belva Crome, MD;  Location: [facility];  Service: Cardiovascular;  Laterality: N/A;  . cardiac stent  2012   to RCA  . CHOLECYSTECTOMY N/A 10/02/2017   Procedure: LAPAROSCOPIC CHOLECYSTECTOMY;  Surgeon: Ralene Ok, MD;  Location: [facility];  Service: General;  Laterality: N/A;  . CORONARY ANGIOPLASTY    . ESOPHAGEAL MANOMETRY N/A 10/29/2016   Procedure: ESOPHAGEAL MANOMETRY (EM);  Surgeon: Garlan Fair, MD;  [facility];  Service: Endoscopy;  Laterality: N/A;  . ESOPHAGOGASTRODUODENOSCOPY (EGD) WITH PROPOFOL N/A 10/28/2016   Procedure: ESOPHAGOGASTRODUODENOSCOPY (EGD) WITH PROPOFOL;  Surgeon: Garlan Fair, MD;  [facility];  Service: Endoscopy;  Laterality: N/A;  . EXCISION OF SKIN TAG N/A 07/29/2017   Procedure: EXCISION OF PERIANAL  SKIN TAG;  Surgeon: Ralene Ok, MD;  Location: [facility];  Service: General;  Laterality: N/A;  . KNEE SURGERY  left  . left ear surgery     for bad cut  . LEFT HEART CATHETERIZATION WITH CORONARY ANGIOGRAM N/A 02/22/2013   Procedure: LEFT HEART CATHETERIZATION WITH CORONARY ANGIOGRAM;  Surgeon: [name], MD;  Location: [facility] CATH LAB;  Service: Cardiovascular;  Laterality: N/A;  .  SHOULDER SURGERY Bilateral    rotator cuff   Social History   Occupational History  . Not on file  Tobacco Use  . Smoking status: Current Some Day Smoker    Packs/day: 1.00    Years: 40.00    Pack years: 40.00    Types: Cigarettes    Last attempt to quit: 01/10/2016    Years since quitting: 3.5  . Smokeless tobacco: Never Used  . Tobacco comment: occasional   Substance and Sexual Activity  . Alcohol use: Yes    Alcohol/week: 1.0 standard drinks    Types: 1 Cans of beer per week    Comment: occasional beer  .  Drug use: No  . Sexual activity: Never

## 2019-08-08 ENCOUNTER — Telehealth: Payer: Self-pay | Admitting: Orthopedic Surgery

## 2019-08-08 NOTE — Telephone Encounter (Signed)
Patient returned call from [name]. Patient stated to listen to voicemail left. Patient phone number is [phone].

## 2019-08-08 NOTE — Telephone Encounter (Signed)
Please advise, thank you.

## 2019-08-08 NOTE — Telephone Encounter (Signed)
She is probably having a steroid flair. Cant give her pain meds . Ice and try some Ibuprofen

## 2019-08-08 NOTE — Telephone Encounter (Signed)
Patient was called and informed of using ice packs and taking Ibuprofen for her shoulder and if this persists to call our office back. I informed patient to continue to try and move the arm as much as she can. Patient denies any other symptoms other than pain level 7. States it feels like a toothache.will inform Dr Sharol Given of this.

## 2019-08-08 NOTE — Telephone Encounter (Signed)
Patient was called and informed of message below, lvm to call back if anything is needed.

## 2019-08-08 NOTE — Telephone Encounter (Signed)
Pt called in said the injection she received in her shoulder 08-01-2019 did not help in fact it made her pain worse. Pt is requesting pain medication be sent to [facility]' on [address]. Pt states the tramadol and gabapentin do not help.   [phone]

## 2019-12-05 ENCOUNTER — Other Ambulatory Visit: Payer: Self-pay

## 2019-12-05 ENCOUNTER — Encounter: Payer: Self-pay | Admitting: Orthopedic Surgery

## 2019-12-05 ENCOUNTER — Ambulatory Visit: Payer: Medicare Other | Admitting: Orthopedic Surgery

## 2019-12-05 VITALS — Ht 59.0 in | Wt 121.0 lb

## 2019-12-05 DIAGNOSIS — M7541 Impingement syndrome of right shoulder: Secondary | ICD-10-CM

## 2019-12-05 MED ORDER — METHYLPREDNISOLONE ACETATE 40 MG/ML IJ SUSP
40.0000 mg | INTRAMUSCULAR | Status: AC | PRN
  Administered 2019-12-05: 40 mg via INTRA_ARTICULAR

## 2019-12-05 MED ORDER — LIDOCAINE HCL 1 % IJ SOLN
5.0000 mL | INTRAMUSCULAR | Status: AC | PRN
  Administered 2019-12-05: 5 mL

## 2019-12-05 NOTE — Progress Notes (Signed)
Office Visit Note   Patient: [name]           Date of Birth: [birth year]           MRN: [id number] Visit Date: 12/05/2019              Requested by: [name], MD [facility] [employer] [place] [address],  [place] [address] PCP: [name], MD  Chief Complaint  Patient presents with  . Right Shoulder - Pain      HPI: Patient is a 74 year old woman who presents with recurrent right shoulder pain.  She states she had an injection in December last year and had good relief.  She states the pain has reoccurred and she has had pain for the past month.  She complains of popping when she raises her arm.  Assessment & Plan: Visit Diagnoses:  1. Impingement syndrome of right shoulder     Plan: Right shoulder was injected in the subacromial space she tolerated this well follow-up as needed.  Follow-Up Instructions: Return if symptoms worsen or fail to improve.   Ortho Exam  Patient is alert, oriented, no adenopathy, well-dressed, normal affect, normal respiratory effort. Examination patient has active abduction flexion to 70 degrees passively she has range of motion to 120 degrees she has pain with Neer and Hawkins impingement test the biceps tendon is tender to palpation the [facility] joint is not tender.  Imaging: No results found. No images are attached to the encounter.  Labs: Lab Results  Component Value Date   HGBA1C 6.4 (H) 10/01/2017   HGBA1C 6.7 (H) 07/29/2017   HGBA1C 6.3 (H) 04/19/2014   ESRSEDRATE 9 08/29/2007   REPTSTATUS 03/10/2013 FINAL 03/09/2013   CULT NO GROWTH 03/09/2013     Lab Results  Component Value Date   ALBUMIN 3.2 (L) 02/04/2016   ALBUMIN 2.8 (L) 04/19/2014   ALBUMIN 3.2 (L) 09/22/2013    Lab Results  Component Value Date   MG 2.0 01/24/2017   MG 2.1 09/21/2013   MG 2.0 03/14/2007   No results found for: VD25OH  No results found for: PREALBUMIN CBC EXTENDED Latest Ref Rng & Units 05/15/2019 10/01/2017 07/29/2017  WBC 4.0 - 10.5  K/uL 5.8 5.3 5.8  RBC 3.87 - 5.11 MIL/uL 3.77(L) 4.52 4.22  HGB 12.0 - 15.0 g/dL 10.2(L) 13.4 12.7  HCT 36.0 - 46.0 % 32.2(L) 40.4 38.5  PLT 150 - 400 K/uL 334 294 293  NEUTROABS 1.7 - 7.7 K/uL - - -  LYMPHSABS 0.7 - 4.0 K/uL - - -     Body mass index is 24.44 kg/m.  Orders:  No orders of the defined types were placed in this encounter.  No orders of the defined types were placed in this encounter.    Procedures: Large Joint Inj: R subacromial bursa on 12/05/2019 11:17 AM Indications: diagnostic evaluation and pain Details: 22 G 1.5 in needle, posterior approach  Arthrogram: No  Medications: 5 mL lidocaine 1 %; 40 mg methylPREDNISolone acetate 40 MG/ML Outcome: tolerated well, no immediate complications Procedure, treatment alternatives, risks and benefits explained, specific risks discussed. Consent was given by the patient. Immediately prior to procedure a time out was called to verify the correct patient, procedure, equipment, support staff and site/side marked as required. Patient was prepped and draped in the usual sterile fashion.      Clinical Data: No additional findings.  ROS:  All other systems negative, except as noted in the HPI. Review of Systems  Objective: Vital  Signs: Ht 4\' 11"  (1.499 m)   Wt 121 lb (54.9 kg)   BMI 24.44 kg/m   Specialty Comments:  No specialty comments available.  PMFS History: Patient Active Problem List   Diagnosis Date Noted  . Variant angina ([place]) 01/24/2017  . Chest pain 01/23/2017  . Headache 01/23/2017  . S/P angioplasty with stent 01/23/2017  . Coronary artery disease 11/24/2014  . Syncope 10/22/2014  . Hyperglycemia 04/20/2014  . H/O hiatal hernia 04/19/2014  . 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 HTN (hypertension) 12/08/2011  . GERD (gastroesophageal reflux disease) 12/08/2011  . Depression 12/08/2011  . Dyslipidemia 12/08/2011   Past Medical History:  Diagnosis Date  . Anemia     . Anginal pain ([place])    last cp was last year  . Anxiety   . Arthritis   . Blood transfusion   . Chest pain 02/04/2016  . Coronary artery disease   . Coronary artery spasm, wth continued episodes of chest pain.  09/26/2013  . Coronary vasospasm ([place])   . Diabetes mellitus without complication ([place])    type II  . GERD (gastroesophageal reflux disease)   . Headache   . Hiatal hernia   . Hypercholesteremia   . Hypertension   . NSTEMI (non-ST elevated myocardial infarction) ([place]) not sure  . NSVT (nonsustained ventricular tachycardia) ([place]) 09/26/2013  . Panic attack     Family History  Problem Relation Age of Onset  . Heart failure Mother   . Stroke Sister   . Heart attack Other     Past Surgical History:  Procedure Laterality Date  . ABDOMINAL HYSTERECTOMY     PARTIAL HYSTERECTOMY  . breast     right, tumor benign  . BREAST EXCISIONAL BIOPSY Right   . CARDIAC CATHETERIZATION  2014  . CARDIAC CATHETERIZATION  2012  . CARDIAC CATHETERIZATION N/A 07/22/2016   Procedure: Left Heart Cath and Coronary Angiography;  Surgeon: Belva Crome, MD;  Location: [place] [facility];  Service: Cardiovascular;  Laterality: N/A;  . cardiac stent  2012   to RCA  . CHOLECYSTECTOMY N/A 10/02/2017   Procedure: LAPAROSCOPIC CHOLECYSTECTOMY;  Surgeon: Ralene Ok, MD;  Location: [facility];  Service: General;  Laterality: N/A;  . CORONARY ANGIOPLASTY    . ESOPHAGEAL MANOMETRY N/A 10/29/2016   Procedure: ESOPHAGEAL MANOMETRY (EM);  Surgeon: Garlan Fair, MD;  [facility];  Service: Endoscopy;  Laterality: N/A;  . ESOPHAGOGASTRODUODENOSCOPY (EGD) WITH PROPOFOL N/A 10/28/2016   Procedure: ESOPHAGOGASTRODUODENOSCOPY (EGD) WITH PROPOFOL;  Surgeon: Garlan Fair, MD;  [facility];  Service: Endoscopy;  Laterality: N/A;  . EXCISION OF SKIN TAG N/A 07/29/2017   Procedure: EXCISION OF PERIANAL  SKIN TAG;  Surgeon: Ralene Ok, MD;  Location: [facility];  Service: General;   Laterality: N/A;  . KNEE SURGERY  left  . left ear surgery     for bad cut  . LEFT HEART CATHETERIZATION WITH CORONARY ANGIOGRAM N/A 02/22/2013   Procedure: LEFT HEART CATHETERIZATION WITH CORONARY ANGIOGRAM;  Surgeon: [name], MD;  Location: [facility];  Service: Cardiovascular;  Laterality: N/A;  . SHOULDER SURGERY Bilateral    rotator cuff   Social History   Occupational History  . Not on file  Tobacco Use  . Smoking status: Current Some Day Smoker    Packs/day: 1.00    Years: 40.00    Pack years: 40.00    Types: Cigarettes    Last attempt  to quit: 01/10/2016    Years since quitting: 3.9  . Smokeless tobacco: Never Used  . Tobacco comment: occasional   Substance and Sexual Activity  . Alcohol use: Yes    Alcohol/week: 1.0 standard drinks    Types: 1 Cans of beer per week    Comment: occasional beer  . Drug use: No  . Sexual activity: Never

## 2020-01-10 ENCOUNTER — Encounter (HOSPITAL_COMMUNITY): Payer: Self-pay

## 2020-01-10 ENCOUNTER — Ambulatory Visit (INDEPENDENT_AMBULATORY_CARE_PROVIDER_SITE_OTHER): Payer: Medicare Other

## 2020-01-10 ENCOUNTER — Other Ambulatory Visit: Payer: Self-pay

## 2020-01-10 ENCOUNTER — Ambulatory Visit (HOSPITAL_COMMUNITY)
Admission: EM | Admit: 2020-01-10 | Discharge: 2020-01-10 | Disposition: A | Discharge status: Home / Self Care | Payer: Medicare Other | Attending: Family Medicine | Admitting: Family Medicine

## 2020-01-10 DIAGNOSIS — M79675 Pain in left toe(s): Secondary | ICD-10-CM | POA: Diagnosis not present

## 2020-01-10 DIAGNOSIS — M79672 Pain in left foot: Secondary | ICD-10-CM | POA: Diagnosis not present

## 2020-01-10 NOTE — ED Triage Notes (Signed)
Patient reports injury to the second digit on her left foot about two weeks ago. Had been taking ibuprofen/tylenol, resting, icing the toe, and soaking it in epsom salt with no relief.

## 2020-01-10 NOTE — ED Provider Notes (Signed)
[facility]   [id number] 01/10/20 Arrival Time: [id number]  ASSESSMENT & PLAN:  1. Pain of toe of left foot     I have personally viewed the imaging studies ordered this visit. No fracture appreciated.  Continue OTC Aleve as needed. WBAT.   Orders Placed This Encounter  Procedures  . DG Foot Complete Left    Recommend:  Follow-up Information    Schedule an appointment as soon as possible for a visit  with [facility]).   Contact information: [address] [place],  [place]  [address]  [phone]          Reviewed expectations re: course of current medical issues. Questions answered. Outlined signs and symptoms indicating need for more acute intervention. Patient verbalized understanding. After Visit Summary given.  SUBJECTIVE: History from: patient. [name] is a 74 y.o. female who reports left foot pain/2nd toe pain. Gradual onset; first noted 1.5 w ago. Questions injury. No extremity sensation changes or weakness. Ambulatory. Usually wears Crocs. Afebrile. No skin changes. Aleve with some relief.   Past Surgical History:  Procedure Laterality Date  . ABDOMINAL HYSTERECTOMY     PARTIAL HYSTERECTOMY  . breast     right, tumor benign  . BREAST EXCISIONAL BIOPSY Right   . CARDIAC CATHETERIZATION  2014  . CARDIAC CATHETERIZATION  2012  . CARDIAC CATHETERIZATION N/A 07/22/2016   Procedure: Left Heart Cath and Coronary Angiography;  Surgeon: Belva Crome, MD;  Location: [place];  Service: Cardiovascular;  Laterality: N/A;  . cardiac stent  2012   to RCA  . CHOLECYSTECTOMY N/A 10/02/2017   Procedure: LAPAROSCOPIC CHOLECYSTECTOMY;  Surgeon: Ralene Ok, MD;  Location: [place];  Service: General;  Laterality: N/A;  . CORONARY ANGIOPLASTY    . ESOPHAGEAL MANOMETRY N/A 10/29/2016   Procedure: ESOPHAGEAL MANOMETRY (EM);  Surgeon: Garlan Fair, MD;  [facility];  Service: Endoscopy;  Laterality:  N/A;  . ESOPHAGOGASTRODUODENOSCOPY (EGD) WITH PROPOFOL N/A 10/28/2016   Procedure: ESOPHAGOGASTRODUODENOSCOPY (EGD) WITH PROPOFOL;  Surgeon: Garlan Fair, MD;  [facility];  Service: Endoscopy;  Laterality: N/A;  . EXCISION OF SKIN TAG N/A 07/29/2017   Procedure: EXCISION OF PERIANAL  SKIN TAG;  Surgeon: Ralene Ok, MD;  Location: [facility];  Service: General;  Laterality: N/A;  . KNEE SURGERY  left  . left ear surgery     for bad cut  . LEFT HEART CATHETERIZATION WITH CORONARY ANGIOGRAM N/A 02/22/2013   Procedure: LEFT HEART CATHETERIZATION WITH CORONARY ANGIOGRAM;  Surgeon: [name], MD;  Location: [facility] CATH LAB;  Service: Cardiovascular;  Laterality: N/A;  . SHOULDER SURGERY Bilateral    rotator cuff      OBJECTIVE:  Vitals:   01/10/20 1005  BP: 125/66  Pulse: (!) 101  Resp: 16  Temp: 98.5 F (36.9 C)  TempSrc: Oral  SpO2: 100%    General appearance: alert; no distress HEENT: [place]; AT Neck: supple with FROM Resp: unlabored respirations Extremities: . LLE: warm with well perfused appearance; poorly localized moderate tenderness over left distal foot and left second toe; with slight flexion at DIP; swelling: none; bruising: none; second toe ROM: normal CV: brisk extremity capillary refill of LLE; 2+ DP of LLE. Skin: warm and dry; no visible rashes Neurologic: gait normal; normal sensation and strength of LLE Psychological: alert and cooperative; normal mood and affect  Imaging: DG Foot Complete Left  Result Date: 01/10/2020 CLINICAL DATA:  Left foot pain  after injury 2 weeks ago. EXAM: LEFT FOOT - COMPLETE 3+ VIEW COMPARISON:  None. FINDINGS: There is no evidence of fracture or dislocation. There is no evidence of arthropathy or other focal bone abnormality. Soft tissues are unremarkable. IMPRESSION: Negative. Electronically Signed   By: Marijo Conception M.D.   On: 01/10/2020 10:45      No Known Allergies  Past Medical History:  Diagnosis Date  .  Anemia   . Anginal pain ([place])    last cp was last year  . Anxiety   . Arthritis   . Blood transfusion   . Chest pain 02/04/2016  . Coronary artery disease   . Coronary artery spasm, wth continued episodes of chest pain.  09/26/2013  . Coronary vasospasm ([place])   . Diabetes mellitus without complication ([place])    type II  . GERD (gastroesophageal reflux disease)   . Headache   . Hiatal hernia   . Hypercholesteremia   . Hypertension   . NSTEMI (non-ST elevated myocardial infarction) ([place]) not sure  . NSVT (nonsustained ventricular tachycardia) ([place]) 09/26/2013  . Panic attack    Social History   Socioeconomic History  . Marital status: Married    Spouse name: [name]  . Number of children: 0  . Years of education: Not on file  . Highest education level: Not on file  Occupational History  . Not on file  Tobacco Use  . Smoking status: Current Some Day Smoker    Packs/day: 0.50    Years: 40.00    Pack years: 20.00    Types: Cigarettes    Last attempt to quit: 01/10/2016    Years since quitting: 4.0  . Smokeless tobacco: Never Used  . Tobacco comment: occasional   Substance and Sexual Activity  . Alcohol use: Yes    Alcohol/week: 1.0 standard drinks    Types: 1 Cans of beer per week    Comment: occasional beer  . Drug use: No  . Sexual activity: Never  Other Topics Concern  . Not on file  Social History Narrative   Lives with husband. Ambulates independently.   Social Determinants of Health   Financial Resource Strain:   . Difficulty of Paying Living Expenses:   Food Insecurity:   . Worried About Charity fundraiser in the Last Year:   . Arboriculturist in the Last Year:   Transportation Needs:   . Film/video editor (Medical):   Mar[place] Kitchen Lack of Transportation (Non-Medical):   Physical Activity:   . Days of Exercise per Week:   . Minutes of Exercise per Session:   Stress:   . Feeling of Stress :   Social Connections:   . Frequency of Communication with Friends and  Family:   . Frequency of Social Gatherings with Friends and Family:   . Attends Religious Services:   . Active Member of Clubs or Organizations:   . Attends Archivist Meetings:   Marland Kitchen Marital Status:    Family History  Problem Relation Age of Onset  . Heart failure Mother   . Stroke Sister   . Heart attack Other    Past Surgical History:  Procedure Laterality Date  . ABDOMINAL HYSTERECTOMY     PARTIAL HYSTERECTOMY  . breast     right, tumor benign  . BREAST EXCISIONAL BIOPSY Right   . CARDIAC CATHETERIZATION  2014  . CARDIAC CATHETERIZATION  2012  . CARDIAC CATHETERIZATION N/A 07/22/2016   Procedure: Left Heart Cath and  Coronary Angiography;  Surgeon: Belva Crome, MD;  Location: [place] [facility];  Service: Cardiovascular;  Laterality: N/A;  . cardiac stent  2012   to RCA  . CHOLECYSTECTOMY N/A 10/02/2017   Procedure: LAPAROSCOPIC CHOLECYSTECTOMY;  Surgeon: Ralene Ok, MD;  Location: [facility];  Service: General;  Laterality: N/A;  . CORONARY ANGIOPLASTY    . ESOPHAGEAL MANOMETRY N/A 10/29/2016   Procedure: ESOPHAGEAL MANOMETRY (EM);  Surgeon: Garlan Fair, MD;  [facility];  Service: Endoscopy;  Laterality: N/A;  . ESOPHAGOGASTRODUODENOSCOPY (EGD) WITH PROPOFOL N/A 10/28/2016   Procedure: ESOPHAGOGASTRODUODENOSCOPY (EGD) WITH PROPOFOL;  Surgeon: Garlan Fair, MD;  [facility];  Service: Endoscopy;  Laterality: N/A;  . EXCISION OF SKIN TAG N/A 07/29/2017   Procedure: EXCISION OF PERIANAL  SKIN TAG;  Surgeon: Ralene Ok, MD;  Location: [place] [facility];  Service: General;  Laterality: N/A;  . KNEE SURGERY  left  . left ear surgery     for bad cut  . LEFT HEART CATHETERIZATION WITH CORONARY ANGIOGRAM N/A 02/22/2013   Procedure: LEFT HEART CATHETERIZATION WITH CORONARY ANGIOGRAM;  Surgeon: [name], MD;  Location: [facility] CATH LAB;  Service: Cardiovascular;  Laterality: N/A;  . SHOULDER SURGERY Bilateral    rotator cuff      Vanessa Kick, MD 01/10/20 1050

## 2020-01-27 ENCOUNTER — Ambulatory Visit (INDEPENDENT_AMBULATORY_CARE_PROVIDER_SITE_OTHER): Payer: Medicare Other

## 2020-01-27 ENCOUNTER — Other Ambulatory Visit: Payer: Self-pay | Admitting: Podiatry

## 2020-01-27 ENCOUNTER — Other Ambulatory Visit: Payer: Self-pay

## 2020-01-27 ENCOUNTER — Ambulatory Visit: Payer: Medicare Other | Admitting: Podiatry

## 2020-01-27 ENCOUNTER — Encounter: Payer: Self-pay | Admitting: Podiatry

## 2020-01-27 DIAGNOSIS — M79675 Pain in left toe(s): Secondary | ICD-10-CM

## 2020-01-27 DIAGNOSIS — M779 Enthesopathy, unspecified: Secondary | ICD-10-CM | POA: Diagnosis not present

## 2020-01-29 NOTE — Progress Notes (Signed)
Subjective:   Patient ID: [name], female   DOB: 74 y.o.   MRN: [id number]   HPI Patient states she hit her toe about a month ago which remained sore and she had an x-ray which at that time was negative for fracture but she is concerned about the fact she still having pain and patient does not smoke currently likes to be active and cannot wear shoe gear comfortably   Review of Systems  All other systems reviewed and are negative.       Objective:  Physical Exam Vitals and nursing note reviewed.  Constitutional:      Appearance: She is well-developed.  Pulmonary:     Effort: Pulmonary effort is normal.  Musculoskeletal:        General: Normal range of motion.  Skin:    General: Skin is warm.  Neurological:     Mental Status: She is alert.     Neurovascular status was found to be intact muscle strength was found to be adequate range of motion was within normal limits.  Patient is noted to have a inflamed second digit distal interphalangeal joint with no excessive movement around the area but quite a bit of pain with palpation at the current time.  Good digital perfusion well oriented x3     Assessment:  Possibility for fracture versus inflammation or capsulitis of the distal interphalangeal joint digit to left with history of injury     Plan:  H&P educated on both possibilities x-ray reviewed and today I did a block of the left second toe 60 mg like Marcaine mixture sterile prep done and I carefully injected the inner phalangeal joint with 1 mg dexamethasone 1 mg Kenalog and applied compression.  This should solve the problem and if it did not I did discuss surgical intervention in this case  X-rays indicate negativity for signs of fracture appears to be soft tissue injury

## 2020-02-22 ENCOUNTER — Other Ambulatory Visit: Payer: Self-pay | Admitting: Internal Medicine

## 2020-02-22 DIAGNOSIS — Z1231 Encounter for screening mammogram for malignant neoplasm of breast: Secondary | ICD-10-CM

## 2020-03-23 ENCOUNTER — Ambulatory Visit
Admission: RE | Admit: 2020-03-23 | Discharge: 2020-03-23 | Disposition: A | Discharge status: Home / Self Care | Payer: Medicare Other | Source: Ambulatory Visit | Attending: Internal Medicine | Admitting: Internal Medicine

## 2020-03-23 ENCOUNTER — Other Ambulatory Visit: Payer: Self-pay

## 2020-03-23 DIAGNOSIS — Z1231 Encounter for screening mammogram for malignant neoplasm of breast: Secondary | ICD-10-CM

## 2020-06-18 ENCOUNTER — Ambulatory Visit: Payer: Medicare Other | Admitting: Podiatry

## 2020-06-18 ENCOUNTER — Other Ambulatory Visit: Payer: Self-pay

## 2020-06-18 DIAGNOSIS — M779 Enthesopathy, unspecified: Secondary | ICD-10-CM | POA: Diagnosis not present

## 2020-06-18 DIAGNOSIS — M2042 Other hammer toe(s) (acquired), left foot: Secondary | ICD-10-CM

## 2020-06-20 NOTE — Progress Notes (Signed)
Subjective:   Patient ID: [name], female   DOB: 74 y.o.   MRN: [id number]   HPI Patient presents stating she is having quite a bit of pain in her left foot and is concerned about digital deformity and pain in the forefoot.  States it has been going on for a while getting worse neuro   ROS      Objective:  Physical Exam  Vascular status was found to be intact with patient found to have a elevated second digit left with moderate rigid contracture of the toe and inflammation of the second and third MPJ with fluid buildup noted around the joint surfaces of a moderate nature.  Patient is found to have good digital perfusion well oriented x3     Assessment:  Inflammatory condition with elevation of the digit contributing to inflammatory low-grade condition     Plan:  H&P reviewed both conditions.  I explained the relationship of them I reviewed with her shoe gear modifications anti-inflammatories physical therapy and reduction of certain activities.  Patient is discharged at this time and hopefully symptoms will read and relax but if they do continue we may need to consider injection or possible straightening of the digit which I educated her on today  Education given and I reviewed x-rays indicating elevation of the toe no indications of advanced arthritis and no indication of stress fracture

## 2020-06-27 ENCOUNTER — Other Ambulatory Visit: Payer: Self-pay

## 2020-06-27 ENCOUNTER — Ambulatory Visit (INDEPENDENT_AMBULATORY_CARE_PROVIDER_SITE_OTHER): Payer: Medicare Other | Admitting: Podiatry

## 2020-06-27 DIAGNOSIS — M2042 Other hammer toe(s) (acquired), left foot: Secondary | ICD-10-CM

## 2020-06-27 MED ORDER — HYDROCODONE-ACETAMINOPHEN 10-325 MG PO TABS
1.0000 | ORAL_TABLET | Freq: Three times a day (TID) | ORAL | 0 refills | Status: AC | PRN
Start: 2020-06-27 — End: 2020-07-02

## 2020-06-27 NOTE — Progress Notes (Signed)
Subjective:   Patient ID: [name], female   DOB: 74 y.o.   MRN: [id number]   HPI Patient presents with chronic digital deformity second left for correction.  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  I then inflated the tourniquet to 250 mmHg and the following procedure was performed.  Distal arthritic digit to left.  Attention was directed dorsal aspect digit to left with some elliptical transverse incision was made over the distal to phalangeal joint.  The medial and lateral collateral ligaments of the distal phalangeal joint was severed and the head of the middle phalanx was delivered from the wound and resected in toto.  The wound was flushed copious amounts of sterile Garamycin solution it was sutured with 5-0 nylon and sterile dressing was applied to the toe.  The tourniquet was released capillary fill is noted immediate to all digits of the left foot the patient was dispensed a surgical shoe and was discharged from the operating room in satisfactory condition and was given all instructions and a prescription for hydrocodone 05/13/2024 #15

## 2020-07-04 ENCOUNTER — Encounter: Payer: Self-pay | Admitting: Podiatry

## 2020-07-04 ENCOUNTER — Ambulatory Visit (INDEPENDENT_AMBULATORY_CARE_PROVIDER_SITE_OTHER): Payer: Medicare Other

## 2020-07-04 ENCOUNTER — Ambulatory Visit (INDEPENDENT_AMBULATORY_CARE_PROVIDER_SITE_OTHER): Payer: Medicare Other | Admitting: Podiatry

## 2020-07-04 ENCOUNTER — Other Ambulatory Visit: Payer: Self-pay

## 2020-07-04 DIAGNOSIS — M2042 Other hammer toe(s) (acquired), left foot: Secondary | ICD-10-CM

## 2020-07-05 NOTE — Progress Notes (Signed)
Subjective:   Patient ID: [name], female   DOB: 74 y.o.   MRN: [id number]   HPI Patient states that she is doing excellent with surgery and is having no issues or pain   ROS      Objective:  Physical Exam  Neurovascular status intact negative Bevelyn Buckles' sign noted patient's second digit has healed beautifully the stitches are intact and the incision site is completely closed with no indications of pathology currently     Assessment:  Doing very well digital procedure digit to left completely sealed at this time     Plan:  H&P x-ray reviewed and today since it was only 3 small stitches and it is completely closed I did remove the stitches.  The wound edge remains completely coapted I did go ahead and applied a sterile dressing that I want her to leave on and I advised to come back if any issues were to occur and she will gradual return to soft shoe gear and will be seen back.  Patient is encouraged to call with questions  X-ray indicates that there is excellent resection of bone had a middle phalanx digit to left foot

## 2020-07-09 ENCOUNTER — Encounter: Payer: Medicare Other | Admitting: Podiatry

## 2020-07-16 ENCOUNTER — Ambulatory Visit: Payer: Medicare Other | Admitting: Physician Assistant

## 2020-07-16 DIAGNOSIS — G8929 Other chronic pain: Secondary | ICD-10-CM

## 2020-07-16 DIAGNOSIS — M25511 Pain in right shoulder: Secondary | ICD-10-CM

## 2020-07-16 MED ORDER — LIDOCAINE HCL 1 % IJ SOLN
5.0000 mL | INTRAMUSCULAR | Status: AC | PRN
  Administered 2020-07-16: 5 mL

## 2020-07-16 MED ORDER — METHYLPREDNISOLONE ACETATE 40 MG/ML IJ SUSP
40.0000 mg | INTRAMUSCULAR | Status: AC | PRN
  Administered 2020-07-16: 40 mg via INTRA_ARTICULAR

## 2020-07-16 NOTE — Progress Notes (Signed)
Office Visit Note   Patient: [name]           Date of Birth: 10/29/1945           MRN: [id number] Visit Date: 07/16/2020              Requested by: [name], MD [place] [address],  [place] [address] PCP: [name], MD  Chief Complaint  Patient presents with  . Right Shoulder - Pain      HPI: Patient presents today with a recurrence of right shoulder pain.  This has been going on for about a month.  She denies any neck pain.  She had the same symptoms in August and did well with an injection.  Of note she is status post rotator cuff debridement.  She is also had the same thing on her left shoulder but no problems.  She also notices a popping when she is moving her shoulder  Assessment & Plan: Visit Diagnoses: No diagnosis found.  Plan: We will go forward with another injection today.  She knows that if this does not help I recommend an MRI.  Follow-up in 3 weeks.  Follow-Up Instructions: No follow-ups on file.   Ortho Exam  Patient is alert, oriented, no adenopathy, well-dressed, normal affect, normal respiratory effort. Focused examination demonstrates no swelling no cellulitis she has pain with internal/external rotation and abduction.  Tenderness in the [facility] joint.  There she does have some popping with range of motion of her shoulder  Imaging: No results found. No images are attached to the encounter.  Labs: Lab Results  Component Value Date   HGBA1C 6.4 (H) 10/01/2017   HGBA1C 6.7 (H) 07/29/2017   HGBA1C 6.3 (H) 04/19/2014   ESRSEDRATE 9 08/29/2007   REPTSTATUS 03/10/2013 FINAL 03/09/2013   CULT NO GROWTH 03/09/2013     Lab Results  Component Value Date   ALBUMIN 3.2 (L) 02/04/2016   ALBUMIN 2.8 (L) 04/19/2014   ALBUMIN 3.2 (L) 09/22/2013    Lab Results  Component Value Date   MG 2.0 01/24/2017   MG 2.1 09/21/2013   MG 2.0 03/14/2007   No results found for: VD25OH  No results found for: PREALBUMIN CBC EXTENDED  Latest Ref Rng & Units 05/15/2019 10/01/2017 07/29/2017  WBC 4.0 - 10.5 K/uL 5.8 5.3 5.8  RBC 3.87 - 5.11 MIL/uL 3.77(L) 4.52 4.22  HGB 12.0 - 15.0 g/dL 10.2(L) 13.4 12.7  HCT 36 - 46 % 32.2(L) 40.4 38.5  PLT 150 - 400 K/uL 334 294 293  NEUTROABS 1.7 - 7.7 K/uL - - -  LYMPHSABS 0.7 - 4.0 K/uL - - -     There is no height or weight on file to calculate BMI.  Orders:  No orders of the defined types were placed in this encounter.  No orders of the defined types were placed in this encounter.    Procedures: Large Joint Inj: L subacromial bursa on 07/16/2020 9:16 AM Indications: diagnostic evaluation and pain Details: 22 G 1.5 in needle, posterior approach  Arthrogram: No  Medications: 5 mL lidocaine 1 %; 40 mg methylPREDNISolone acetate 40 MG/ML Outcome: tolerated well, no immediate complications Procedure, treatment alternatives, risks and benefits explained, specific risks discussed. Consent was given by the patient.      Clinical Data: No additional findings.  ROS:  All other systems negative, except as noted in the HPI. Review of Systems  Objective: Vital Signs: There were no vitals taken for this visit.  Specialty Comments:  No specialty comments available.  PMFS History: Patient Active Problem List   Diagnosis Date Noted  . 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 Chest pain 01/23/2017  . Headache 01/23/2017  . S/P angioplasty with stent 01/23/2017  . Coronary artery disease 11/24/2014  . Syncope 10/22/2014  . Hyperglycemia 04/20/2014  . H/O hiatal hernia 04/19/2014  . 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 HTN (hypertension) 12/08/2011  . GERD (gastroesophageal reflux disease) 12/08/2011  . Depression 12/08/2011  . Dyslipidemia 12/08/2011   Past Medical History:  Diagnosis Date  . Anemia   . Anginal pain ([place])    last cp was last year  . Anxiety   . Arthritis   . Blood transfusion   . Chest pain 02/04/2016  . Coronary artery  disease   . Coronary artery spasm, wth continued episodes of chest pain.  09/26/2013  . Coronary vasospasm ([place])   . Diabetes mellitus without complication ([place])    type II  . GERD (gastroesophageal reflux disease)   . Headache   . Hiatal hernia   . Hypercholesteremia   . Hypertension   . NSTEMI (non-ST elevated myocardial infarction) ([place]) not sure  . NSVT (nonsustained ventricular tachycardia) ([place]) 09/26/2013  . Panic attack     Family History  Problem Relation Age of Onset  . Heart failure Mother   . Stroke Sister   . Heart attack Other     Past Surgical History:  Procedure Laterality Date  . ABDOMINAL HYSTERECTOMY     PARTIAL HYSTERECTOMY  . breast     right, tumor benign  . BREAST EXCISIONAL BIOPSY Right   . CARDIAC CATHETERIZATION  2014  . CARDIAC CATHETERIZATION  2012  . CARDIAC CATHETERIZATION N/A 07/22/2016   Procedure: Left Heart Cath and Coronary Angiography;  Surgeon: Belva Crome, MD;  Location: [place] [facility];  Service: Cardiovascular;  Laterality: N/A;  . cardiac stent  2012   to RCA  . CHOLECYSTECTOMY N/A 10/02/2017   Procedure: LAPAROSCOPIC CHOLECYSTECTOMY;  Surgeon: Ralene Ok, MD;  Location: [place];  Service: General;  Laterality: N/A;  . CORONARY ANGIOPLASTY    . ESOPHAGEAL MANOMETRY N/A 10/29/2016   Procedure: ESOPHAGEAL MANOMETRY (EM);  Surgeon: Garlan Fair, MD;  Location: [facility];  Service: Endoscopy;  Laterality: N/A;  . ESOPHAGOGASTRODUODENOSCOPY (EGD) WITH PROPOFOL N/A 10/28/2016   Procedure: ESOPHAGOGASTRODUODENOSCOPY (EGD) WITH PROPOFOL;  Surgeon: Garlan Fair, MD;  [facility]: [facility];  Service: Endoscopy;  Laterality: N/A;  . EXCISION OF SKIN TAG N/A 07/29/2017   Procedure: EXCISION OF PERIANAL  SKIN TAG;  Surgeon: Ralene Ok, MD;  Location: [place];  Service: General;  Laterality: N/A;  . KNEE SURGERY  left  . left ear surgery     for bad cut  . LEFT HEART CATHETERIZATION WITH CORONARY ANGIOGRAM N/A 02/22/2013    Procedure: LEFT HEART CATHETERIZATION WITH CORONARY ANGIOGRAM;  Surgeon: [name], MD;  Location: [facility] CATH LAB;  Service: Cardiovascular;  Laterality: N/A;  . SHOULDER SURGERY Bilateral    rotator cuff   Social History   Occupational History  . Not on file  Tobacco Use  . Smoking status: Current Some Day Smoker    Packs/day: 0.50    Years: 40.00    Pack years: 20.00    Types: Cigarettes    Last attempt to quit: 01/10/2016    Years since quitting: 4.5  . Smokeless tobacco: Never Used  . Tobacco comment: occasional  Vaping Use  . Vaping Use: Never used  Substance and Sexual Activity  . Alcohol use: Yes    Alcohol/week: 1.0 standard drink    Types: 1 Cans of beer per week    Comment: occasional beer  . Drug use: No  . Sexual activity: Never

## 2020-07-30 ENCOUNTER — Ambulatory Visit (INDEPENDENT_AMBULATORY_CARE_PROVIDER_SITE_OTHER): Payer: Medicare Other

## 2020-07-30 ENCOUNTER — Other Ambulatory Visit: Payer: Self-pay

## 2020-07-30 ENCOUNTER — Ambulatory Visit (INDEPENDENT_AMBULATORY_CARE_PROVIDER_SITE_OTHER): Payer: Medicare Other | Admitting: Podiatry

## 2020-07-30 ENCOUNTER — Encounter: Payer: Self-pay | Admitting: Podiatry

## 2020-07-30 DIAGNOSIS — M2042 Other hammer toe(s) (acquired), left foot: Secondary | ICD-10-CM

## 2020-07-30 NOTE — Progress Notes (Signed)
Subjective:   Patient ID: [name], female   DOB: 74 y.o.   MRN: [id number]   HPI Patient presents stating I am still getting some soreness in my toe and I wanted to make sure everything was okay   ROS      Objective:  Physical Exam  Neurovascular status intact negative Bevelyn Buckles' sign noted patient's left second toe has mild edema but overall is healing well with no significant pathology.  Patient has good digital perfusion     Assessment:  Doing well post distal arthroplasty digit to left wound edges well coapted digit in good alignment mild swelling     Plan:  Explained is normal to still have some swelling pain but this should reduce and I advised on slowly wearing wider shoes mesh materials and will be seen back as needed  X-ray indicates satisfactory resection of bone digit to left

## 2020-08-06 ENCOUNTER — Ambulatory Visit: Payer: Medicare Other | Admitting: Physician Assistant

## 2020-08-06 ENCOUNTER — Other Ambulatory Visit: Payer: Self-pay

## 2020-08-06 ENCOUNTER — Encounter: Payer: Self-pay | Admitting: Physician Assistant

## 2020-08-06 VITALS — Ht 59.0 in | Wt 121.0 lb

## 2020-08-06 DIAGNOSIS — M7541 Impingement syndrome of right shoulder: Secondary | ICD-10-CM

## 2020-08-06 NOTE — Progress Notes (Signed)
Office Visit Note   Patient: [name]           Date of Birth: 1946-05-10           MRN: [id number] Visit Date: 08/06/2020              Requested by: Georgann Housekeeper, MD [address] [place],  [place] [address] PCP: Georgann Housekeeper, MD  Chief Complaint  Patient presents with  . Right Shoulder - Follow-up    Right shoulder follow up      HPI: Patient is 3 weeks status post injection into her right shoulder for right shoulder pain.  She is doing very well and has had a significant relief in symptoms.  Still has a lot of little trouble going behind her back but otherwise is well  Assessment & Plan: Visit Diagnoses: No diagnosis found.  Plan: May follow-up as needed certainly she can get another injection in 2 to 3 months if she had continued pain and the injection did not help we would recommend an MRI  Follow-Up Instructions: No follow-ups on file.   Ortho Exam  Patient is alert, oriented, no adenopathy, well-dressed, normal affect, normal respiratory effort. Right shoulder good range of motion without pain she has full forward extension flexion to behind her back to almost her bra strap.  Good abductor strength.  No tenderness to palpation  Imaging: No results found. No images are attached to the encounter.  Labs: Lab Results  Component Value Date   HGBA1C 6.4 (H) 10/01/2017   HGBA1C 6.7 (H) 07/29/2017   HGBA1C 6.3 (H) 04/19/2014   ESRSEDRATE 9 08/29/2007   REPTSTATUS 03/10/2013 FINAL 03/09/2013   CULT NO GROWTH 03/09/2013     Lab Results  Component Value Date   ALBUMIN 3.2 (L) 02/04/2016   ALBUMIN 2.8 (L) 04/19/2014   ALBUMIN 3.2 (L) 09/22/2013    Lab Results  Component Value Date   MG 2.0 01/24/2017   MG 2.1 09/21/2013   MG 2.0 03/14/2007   No results found for: VD25OH  No results found for: PREALBUMIN CBC EXTENDED Latest Ref Rng & Units 05/15/2019 10/01/2017 07/29/2017  WBC 4.0 - 10.5 K/uL 5.8 5.3 5.8  RBC 3.87 - 5.11 MIL/uL  3.77(L) 4.52 4.22  HGB 12.0 - 15.0 g/dL 10.2(L) 13.4 12.7  HCT 36.0 - 46.0 % 32.2(L) 40.4 38.5  PLT 150 - 400 K/uL 334 294 293  NEUTROABS 1.7 - 7.7 K/uL - - -  LYMPHSABS 0.7 - 4.0 K/uL - - -     Body mass index is 24.44 kg/m.  Orders:  No orders of the defined types were placed in this encounter.  No orders of the defined types were placed in this encounter.    Procedures: No procedures performed  Clinical Data: No additional findings.  ROS:  All other systems negative, except as noted in the HPI. Review of Systems  Objective: Vital Signs: Ht 4\' 11"  (1.499 m)   Wt 121 lb (54.9 kg)   BMI 24.44 kg/m   Specialty Comments:  No specialty comments available.  PMFS History: Patient Active Problem List   Diagnosis Date Noted  . Variant angina ([facility]) 01/24/2017  . Chest pain 01/23/2017  . Headache 01/23/2017  . S/P angioplasty with stent 01/23/2017  . Coronary artery disease 11/24/2014  . Syncope 10/22/2014  . Hyperglycemia 04/20/2014  . H/O hiatal hernia 04/19/2014  . 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 Coronary artery vasospasm ([facility]) 09/26/2013  . HTN (hypertension) 12/08/2011  .  GERD (gastroesophageal reflux disease) 12/08/2011  . Depression 12/08/2011  . Dyslipidemia 12/08/2011   Past Medical History:  Diagnosis Date  . Anemia   . Anginal pain ([facility])    last cp was last year  . Anxiety   . Arthritis   . Blood transfusion   . Chest pain 02/04/2016  . Coronary artery disease   . Coronary artery spasm, wth continued episodes of chest pain.  09/26/2013  . Coronary vasospasm ([facility])   . Diabetes mellitus without complication ([facility])    type II  . GERD (gastroesophageal reflux disease)   . Headache   . Hiatal hernia   . Hypercholesteremia   . Hypertension   . NSTEMI (non-ST elevated myocardial infarction) ([facility]) not sure  . NSVT (nonsustained ventricular tachycardia) ([facility]) 09/26/2013  . Panic attack     Family History  Problem Relation Age of Onset  . Heart failure Mother   .  Stroke Sister   . Heart attack Other     Past Surgical History:  Procedure Laterality Date  . ABDOMINAL HYSTERECTOMY     PARTIAL HYSTERECTOMY  . breast     right, tumor benign  . BREAST EXCISIONAL BIOPSY Right   . CARDIAC CATHETERIZATION  2014  . CARDIAC CATHETERIZATION  2012  . CARDIAC CATHETERIZATION N/A 07/22/2016   Procedure: Left Heart Cath and Coronary Angiography;  Surgeon: Lyn Records, MD;  [facility] INVASIVE CV LAB;  Service: Cardiovascular;  Laterality: N/A;  . cardiac stent  2012   to RCA  . CHOLECYSTECTOMY N/A 10/02/2017   Procedure: LAPAROSCOPIC CHOLECYSTECTOMY;  Surgeon: Axel Filler, MD;  Location: [facility] OR;  Service: General;  Laterality: N/A;  . CORONARY ANGIOPLASTY    . ESOPHAGEAL MANOMETRY N/A 10/29/2016   Procedure: ESOPHAGEAL MANOMETRY (EM);  Surgeon: Charolett Bumpers, MD;  [facility];  Service: Endoscopy;  Laterality: N/A;  . ESOPHAGOGASTRODUODENOSCOPY (EGD) WITH PROPOFOL N/A 10/28/2016   Procedure: ESOPHAGOGASTRODUODENOSCOPY (EGD) WITH PROPOFOL;  Surgeon: Charolett Bumpers, MD;  [facility];  Service: Endoscopy;  Laterality: N/A;  . EXCISION OF SKIN TAG N/A 07/29/2017   Procedure: EXCISION OF PERIANAL  SKIN TAG;  Surgeon: Axel Filler, MD;  Location: [facility] OR;  Service: General;  Laterality: N/A;  . KNEE SURGERY  left  . left ear surgery     for bad cut  . LEFT HEART CATHETERIZATION WITH CORONARY ANGIOGRAM N/A 02/22/2013   Procedure: LEFT HEART CATHETERIZATION WITH CORONARY ANGIOGRAM;  Surgeon: Lesleigh Noe, MD;  Location: [facility] CATH LAB;  Service: Cardiovascular;  Laterality: N/A;  . SHOULDER SURGERY Bilateral    rotator cuff   Social History   Occupational History  . Not on file  Tobacco Use  . Smoking status: Current Some Day Smoker    Packs/day: 0.50    Years: 40.00    Pack years: 20.00    Types: Cigarettes    Last attempt to quit: 01/10/2016    Years since quitting: 4.5  . Smokeless tobacco: Never Used  . Tobacco  comment: occasional   Vaping Use  . Vaping Use: Never used  Substance and Sexual Activity  . Alcohol use: Yes    Alcohol/week: 1.0 standard drink    Types: 1 Cans of beer per week    Comment: occasional beer  . Drug use: No  . Sexual activity: Never

## 2020-08-24 DIAGNOSIS — K219 Gastro-esophageal reflux disease without esophagitis: Secondary | ICD-10-CM | POA: Diagnosis not present

## 2020-08-24 DIAGNOSIS — I739 Peripheral vascular disease, unspecified: Secondary | ICD-10-CM | POA: Diagnosis not present

## 2020-08-24 DIAGNOSIS — I700 Atherosclerosis of aorta: Secondary | ICD-10-CM | POA: Diagnosis not present

## 2020-08-24 DIAGNOSIS — M8588 Other specified disorders of bone density and structure, other site: Secondary | ICD-10-CM | POA: Diagnosis not present

## 2020-08-24 DIAGNOSIS — Z7984 Long term (current) use of oral hypoglycemic drugs: Secondary | ICD-10-CM | POA: Diagnosis not present

## 2020-08-24 DIAGNOSIS — Z0000 Encounter for general adult medical examination without abnormal findings: Secondary | ICD-10-CM | POA: Diagnosis not present

## 2020-08-24 DIAGNOSIS — D649 Anemia, unspecified: Secondary | ICD-10-CM | POA: Diagnosis not present

## 2020-08-24 DIAGNOSIS — E782 Mixed hyperlipidemia: Secondary | ICD-10-CM | POA: Diagnosis not present

## 2020-08-24 DIAGNOSIS — E1169 Type 2 diabetes mellitus with other specified complication: Secondary | ICD-10-CM | POA: Diagnosis not present

## 2020-08-24 DIAGNOSIS — I10 Essential (primary) hypertension: Secondary | ICD-10-CM | POA: Diagnosis not present

## 2020-08-24 DIAGNOSIS — I252 Old myocardial infarction: Secondary | ICD-10-CM | POA: Diagnosis not present

## 2020-08-24 DIAGNOSIS — M5090 Cervical disc disorder, unspecified, unspecified cervical region: Secondary | ICD-10-CM | POA: Diagnosis not present

## 2020-09-10 DIAGNOSIS — G4700 Insomnia, unspecified: Secondary | ICD-10-CM | POA: Diagnosis not present

## 2020-09-10 DIAGNOSIS — E118 Type 2 diabetes mellitus with unspecified complications: Secondary | ICD-10-CM | POA: Diagnosis not present

## 2020-09-10 DIAGNOSIS — I2510 Atherosclerotic heart disease of native coronary artery without angina pectoris: Secondary | ICD-10-CM | POA: Diagnosis not present

## 2020-09-10 DIAGNOSIS — I25111 Atherosclerotic heart disease of native coronary artery with angina pectoris with documented spasm: Secondary | ICD-10-CM | POA: Diagnosis not present

## 2020-09-10 DIAGNOSIS — E1169 Type 2 diabetes mellitus with other specified complication: Secondary | ICD-10-CM | POA: Diagnosis not present

## 2020-09-10 DIAGNOSIS — I252 Old myocardial infarction: Secondary | ICD-10-CM | POA: Diagnosis not present

## 2020-09-10 DIAGNOSIS — E782 Mixed hyperlipidemia: Secondary | ICD-10-CM | POA: Diagnosis not present

## 2020-09-10 DIAGNOSIS — K219 Gastro-esophageal reflux disease without esophagitis: Secondary | ICD-10-CM | POA: Diagnosis not present

## 2020-09-10 DIAGNOSIS — M1990 Unspecified osteoarthritis, unspecified site: Secondary | ICD-10-CM | POA: Diagnosis not present

## 2020-09-10 DIAGNOSIS — I10 Essential (primary) hypertension: Secondary | ICD-10-CM | POA: Diagnosis not present

## 2020-09-20 DIAGNOSIS — R1030 Lower abdominal pain, unspecified: Secondary | ICD-10-CM | POA: Diagnosis not present

## 2020-09-20 DIAGNOSIS — D500 Iron deficiency anemia secondary to blood loss (chronic): Secondary | ICD-10-CM | POA: Diagnosis not present

## 2020-09-20 DIAGNOSIS — K5904 Chronic idiopathic constipation: Secondary | ICD-10-CM | POA: Diagnosis not present

## 2020-09-20 DIAGNOSIS — K219 Gastro-esophageal reflux disease without esophagitis: Secondary | ICD-10-CM | POA: Diagnosis not present

## 2020-10-08 DIAGNOSIS — D500 Iron deficiency anemia secondary to blood loss (chronic): Secondary | ICD-10-CM | POA: Diagnosis not present

## 2020-10-08 DIAGNOSIS — E118 Type 2 diabetes mellitus with unspecified complications: Secondary | ICD-10-CM | POA: Diagnosis not present

## 2020-10-08 DIAGNOSIS — I252 Old myocardial infarction: Secondary | ICD-10-CM | POA: Diagnosis not present

## 2020-10-08 DIAGNOSIS — I10 Essential (primary) hypertension: Secondary | ICD-10-CM | POA: Diagnosis not present

## 2020-10-08 DIAGNOSIS — E782 Mixed hyperlipidemia: Secondary | ICD-10-CM | POA: Diagnosis not present

## 2020-10-08 DIAGNOSIS — K219 Gastro-esophageal reflux disease without esophagitis: Secondary | ICD-10-CM | POA: Diagnosis not present

## 2020-10-08 DIAGNOSIS — G4700 Insomnia, unspecified: Secondary | ICD-10-CM | POA: Diagnosis not present

## 2020-10-08 DIAGNOSIS — I25111 Atherosclerotic heart disease of native coronary artery with angina pectoris with documented spasm: Secondary | ICD-10-CM | POA: Diagnosis not present

## 2020-10-08 DIAGNOSIS — E1169 Type 2 diabetes mellitus with other specified complication: Secondary | ICD-10-CM | POA: Diagnosis not present

## 2020-10-08 DIAGNOSIS — I2510 Atherosclerotic heart disease of native coronary artery without angina pectoris: Secondary | ICD-10-CM | POA: Diagnosis not present

## 2020-10-19 DIAGNOSIS — Z1159 Encounter for screening for other viral diseases: Secondary | ICD-10-CM | POA: Diagnosis not present

## 2020-10-23 DIAGNOSIS — I2510 Atherosclerotic heart disease of native coronary artery without angina pectoris: Secondary | ICD-10-CM | POA: Diagnosis not present

## 2020-10-23 DIAGNOSIS — E782 Mixed hyperlipidemia: Secondary | ICD-10-CM | POA: Diagnosis not present

## 2020-10-23 DIAGNOSIS — M1990 Unspecified osteoarthritis, unspecified site: Secondary | ICD-10-CM | POA: Diagnosis not present

## 2020-10-23 DIAGNOSIS — D509 Iron deficiency anemia, unspecified: Secondary | ICD-10-CM | POA: Diagnosis not present

## 2020-10-23 DIAGNOSIS — K219 Gastro-esophageal reflux disease without esophagitis: Secondary | ICD-10-CM | POA: Diagnosis not present

## 2020-10-23 DIAGNOSIS — I10 Essential (primary) hypertension: Secondary | ICD-10-CM | POA: Diagnosis not present

## 2020-10-23 DIAGNOSIS — D500 Iron deficiency anemia secondary to blood loss (chronic): Secondary | ICD-10-CM | POA: Diagnosis not present

## 2020-10-23 DIAGNOSIS — G4700 Insomnia, unspecified: Secondary | ICD-10-CM | POA: Diagnosis not present

## 2020-10-23 DIAGNOSIS — I252 Old myocardial infarction: Secondary | ICD-10-CM | POA: Diagnosis not present

## 2020-10-23 DIAGNOSIS — I25111 Atherosclerotic heart disease of native coronary artery with angina pectoris with documented spasm: Secondary | ICD-10-CM | POA: Diagnosis not present

## 2020-10-23 DIAGNOSIS — E1169 Type 2 diabetes mellitus with other specified complication: Secondary | ICD-10-CM | POA: Diagnosis not present

## 2020-10-24 DIAGNOSIS — D509 Iron deficiency anemia, unspecified: Secondary | ICD-10-CM | POA: Diagnosis not present

## 2020-10-24 DIAGNOSIS — D123 Benign neoplasm of transverse colon: Secondary | ICD-10-CM | POA: Diagnosis not present

## 2020-10-24 DIAGNOSIS — K2930 Chronic superficial gastritis without bleeding: Secondary | ICD-10-CM | POA: Diagnosis not present

## 2020-10-24 DIAGNOSIS — D122 Benign neoplasm of ascending colon: Secondary | ICD-10-CM | POA: Diagnosis not present

## 2020-10-24 DIAGNOSIS — K3189 Other diseases of stomach and duodenum: Secondary | ICD-10-CM | POA: Diagnosis not present

## 2020-10-24 DIAGNOSIS — K317 Polyp of stomach and duodenum: Secondary | ICD-10-CM | POA: Diagnosis not present

## 2020-10-29 ENCOUNTER — Encounter (HOSPITAL_COMMUNITY): Payer: Self-pay

## 2020-10-29 ENCOUNTER — Emergency Department (HOSPITAL_COMMUNITY): Payer: Medicare Other

## 2020-10-29 ENCOUNTER — Emergency Department (HOSPITAL_COMMUNITY)
Admission: EM | Admit: 2020-10-29 | Discharge: 2020-10-29 | Disposition: A | Discharge status: Home / Self Care | Payer: Medicare Other | Attending: Emergency Medicine | Admitting: Emergency Medicine

## 2020-10-29 DIAGNOSIS — R112 Nausea with vomiting, unspecified: Secondary | ICD-10-CM | POA: Diagnosis not present

## 2020-10-29 DIAGNOSIS — R1084 Generalized abdominal pain: Secondary | ICD-10-CM | POA: Insufficient documentation

## 2020-10-29 DIAGNOSIS — R109 Unspecified abdominal pain: Secondary | ICD-10-CM | POA: Diagnosis not present

## 2020-10-29 DIAGNOSIS — R1110 Vomiting, unspecified: Secondary | ICD-10-CM | POA: Diagnosis not present

## 2020-10-29 DIAGNOSIS — I10 Essential (primary) hypertension: Secondary | ICD-10-CM | POA: Insufficient documentation

## 2020-10-29 DIAGNOSIS — E119 Type 2 diabetes mellitus without complications: Secondary | ICD-10-CM | POA: Insufficient documentation

## 2020-10-29 DIAGNOSIS — I2510 Atherosclerotic heart disease of native coronary artery without angina pectoris: Secondary | ICD-10-CM | POA: Insufficient documentation

## 2020-10-29 DIAGNOSIS — F17210 Nicotine dependence, cigarettes, uncomplicated: Secondary | ICD-10-CM | POA: Insufficient documentation

## 2020-10-29 DIAGNOSIS — K219 Gastro-esophageal reflux disease without esophagitis: Secondary | ICD-10-CM | POA: Diagnosis not present

## 2020-10-29 DIAGNOSIS — R110 Nausea: Secondary | ICD-10-CM

## 2020-10-29 LAB — COMPREHENSIVE METABOLIC PANEL
ALT: 15 U/L (ref 0–44)
AST: 23 U/L (ref 15–41)
Albumin: 3.8 g/dL (ref 3.5–5.0)
Alkaline Phosphatase: 79 U/L (ref 38–126)
Anion gap: 9 (ref 5–15)
BUN: 9 mg/dL (ref 8–23)
CO2: 25 mmol/L (ref 22–32)
Calcium: 9.3 mg/dL (ref 8.9–10.3)
Chloride: 104 mmol/L (ref 98–111)
Creatinine, Ser: 0.74 mg/dL (ref 0.44–1.00)
GFR, Estimated: >60 mL/min (ref >60)
Glucose, Bld: 120 mg/dL — ABNORMAL HIGH (ref 70–99)
Potassium: 4.7 mmol/L (ref 3.5–5.1)
Sodium: 138 mmol/L (ref 135–145)
Total Bilirubin: 0.7 mg/dL (ref 0.3–1.2)
Total Protein: 7 g/dL (ref 6.5–8.1)

## 2020-10-29 LAB — URINALYSIS, ROUTINE W REFLEX MICROSCOPIC
Bilirubin Urine: NEGATIVE
Glucose, UA: NEGATIVE mg/dL
Hgb urine dipstick: NEGATIVE
Ketones, ur: NEGATIVE mg/dL
Nitrite: NEGATIVE
Protein, ur: NEGATIVE mg/dL
Specific Gravity, Urine: >1.046 — ABNORMAL HIGH (ref 1.005–1.030)
pH: 5 (ref 5.0–8.0)

## 2020-10-29 LAB — LIPASE, BLOOD: Lipase: 31 U/L (ref 11–51)

## 2020-10-29 LAB — CBC
HCT: 38.4 % (ref 36.0–46.0)
Hemoglobin: 12.3 g/dL (ref 12.0–15.0)
MCH: 27.6 pg (ref 26.0–34.0)
MCHC: 32 g/dL (ref 30.0–36.0)
MCV: 86.1 fL (ref 80.0–100.0)
Platelets: 290 10*3/uL (ref 150–400)
RBC: 4.46 MIL/uL (ref 3.87–5.11)
RDW: 25.5 % — ABNORMAL HIGH (ref 11.5–15.5)
WBC: 4.9 10*3/uL (ref 4.0–10.5)
nRBC: 0 % (ref 0.0–0.2)

## 2020-10-29 MED ORDER — ONDANSETRON 4 MG PO TBDP: 4.0000 mg | ORAL_TABLET | Freq: Three times a day (TID) | ORAL | 0 refills | Status: DC | PRN

## 2020-10-29 MED ORDER — DICYCLOMINE HCL 10 MG PO CAPS
10.0000 mg | ORAL_CAPSULE | Freq: Once | ORAL | Status: AC
  Administered 2020-10-29: 10 mg via ORAL
  Filled 2020-10-29: qty 1

## 2020-10-29 MED ORDER — OXYCODONE-ACETAMINOPHEN 5-325 MG PO TABS: 1.0000 | ORAL_TABLET | Freq: Four times a day (QID) | ORAL | 0 refills | Status: DC | PRN

## 2020-10-29 MED ORDER — IOHEXOL 300 MG/ML  SOLN
100.0000 mL | Freq: Once | INTRAMUSCULAR | Status: AC | PRN
  Administered 2020-10-29: 100 mL via INTRAVENOUS

## 2020-10-29 MED ORDER — SODIUM CHLORIDE 0.9 % IV BOLUS
500.0000 mL | Freq: Once | INTRAVENOUS | Status: AC
  Administered 2020-10-29: 500 mL via INTRAVENOUS

## 2020-10-29 MED ORDER — MORPHINE SULFATE (PF) 4 MG/ML IV SOLN
4.0000 mg | Freq: Once | INTRAVENOUS | Status: AC
Start: 2020-10-29 — End: 2020-10-29
  Administered 2020-10-29: 4 mg via INTRAVENOUS
  Filled 2020-10-29: qty 1

## 2020-10-29 MED ORDER — OXYCODONE-ACETAMINOPHEN 5-325 MG PO TABS
1.0000 | ORAL_TABLET | Freq: Once | ORAL | Status: AC
Start: 2020-10-29 — End: 2020-10-29
  Administered 2020-10-29: 1 via ORAL
  Filled 2020-10-29: qty 1

## 2020-10-29 MED ORDER — SENNOSIDES-DOCUSATE SODIUM 8.6-50 MG PO TABS: 1.0000 | ORAL_TABLET | Freq: Every evening | ORAL | 0 refills | Status: DC | PRN

## 2020-10-29 MED ORDER — ONDANSETRON HCL 4 MG/2ML IJ SOLN
4.0000 mg | Freq: Once | INTRAMUSCULAR | Status: AC
  Administered 2020-10-29: 4 mg via INTRAVENOUS
  Filled 2020-10-29: qty 2

## 2020-10-29 NOTE — Discharge Instructions (Signed)

## 2020-10-29 NOTE — ED Notes (Signed)
Pr transported to CT

## 2020-10-29 NOTE — ED Notes (Signed)
Pt given saltines and water and able to keep them down without difficulty

## 2020-10-29 NOTE — ED Notes (Signed)
Pt informed of need for urine sample.

## 2020-10-29 NOTE — ED Provider Notes (Signed)
Emergency Department Provider Note   I have reviewed the triage vital signs and the nursing notes.   HISTORY  Chief Complaint Abdominal Pain   HPI [name] is a 75 y.o. female with past medical history reviewed below presents to the emergency department with abdominal pain similar to her prior discomfort after recent upper and lower endoscopy with Dr. Cristina Gong on 3/16.  Patient denies any fevers.  She has had some nausea and vomiting.  She has not noticed bright red blood or black in the emesis or stool.  She is not feeling pain in the chest or shortness of breath.  Denies any dysuria, hesitancy, urgency.  She had no immediate complications from her endoscopies but states that since that time she has had pain and vomiting although does admit it is somewhat similar to prior. She reports calling her GI team and apparently was referred to the ED for evaluation.   Past Medical History:  Diagnosis Date  . Anemia   . Anginal pain ([place])    last cp was last year  . Anxiety   . Arthritis   . Blood transfusion   . Chest pain 02/04/2016  . Coronary artery disease   . Coronary artery spasm, wth continued episodes of chest pain.  09/26/2013  . Coronary vasospasm ([place])   . Diabetes mellitus without complication ([place])    type II  . GERD (gastroesophageal reflux disease)   . Headache   . Hiatal hernia   . Hypercholesteremia   . Hypertension   . NSTEMI (non-ST elevated myocardial infarction) ([place]) not sure  . NSVT (nonsustained ventricular tachycardia) ([place]) 09/26/2013  . Panic attack     Patient Active Problem List   Diagnosis Date Noted  . Variant angina ([place]) 01/24/2017  . Chest pain 01/23/2017  . Headache 01/23/2017  . S/P angioplasty with stent 01/23/2017  . Coronary artery disease 11/24/2014  . Syncope 10/22/2014  . Hyperglycemia 04/20/2014  . H/O hiatal hernia 04/19/2014  . 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 HTN (hypertension) 12/08/2011  .  GERD (gastroesophageal reflux disease) 12/08/2011  . Depression 12/08/2011  . Dyslipidemia 12/08/2011    Past Surgical History:  Procedure Laterality Date  . ABDOMINAL HYSTERECTOMY     PARTIAL HYSTERECTOMY  . breast     right, tumor benign  . BREAST EXCISIONAL BIOPSY Right   . CARDIAC CATHETERIZATION  2014  . CARDIAC CATHETERIZATION  2012  . CARDIAC CATHETERIZATION N/A 07/22/2016   Procedure: Left Heart Cath and Coronary Angiography;  Surgeon: Belva Crome, MD;  Location: [place] [facility];  Service: Cardiovascular;  Laterality: N/A;  . cardiac stent  2012   to RCA  . CHOLECYSTECTOMY N/A 10/02/2017   Procedure: LAPAROSCOPIC CHOLECYSTECTOMY;  Surgeon: Ralene Ok, MD;  Location: [facility] [facility];  Service: General;  Laterality: N/A;  . CORONARY ANGIOPLASTY    . ESOPHAGEAL MANOMETRY N/A 10/29/2016   Procedure: ESOPHAGEAL MANOMETRY (EM);  Surgeon: Garlan Fair, MD;  Location: [facility];  Service: Endoscopy;  Laterality: N/A;  . ESOPHAGOGASTRODUODENOSCOPY (EGD) WITH PROPOFOL N/A 10/28/2016   Procedure: ESOPHAGOGASTRODUODENOSCOPY (EGD) WITH PROPOFOL;  Surgeon: Garlan Fair, MD;  Location: [facility];  Service: Endoscopy;  Laterality: N/A;  . EXCISION OF SKIN TAG N/A 07/29/2017   Procedure: EXCISION OF PERIANAL  SKIN TAG;  Surgeon: Ralene Ok, MD;  Location: [facility];  Service: General;  Laterality: N/A;  . KNEE SURGERY  left  . left ear surgery  for bad cut  . LEFT HEART CATHETERIZATION WITH CORONARY ANGIOGRAM N/A 02/22/2013   Procedure: LEFT HEART CATHETERIZATION WITH CORONARY ANGIOGRAM;  Surgeon: [name], MD;  Location: [facility] CATH [facility];  Service: Cardiovascular;  Laterality: N/A;  . SHOULDER SURGERY Bilateral    rotator cuff    Allergies Patient has no known allergies.  Family History  Problem Relation Age of Onset  . Heart failure Mother   . Stroke Sister   . Heart attack Other     Social History Social History   Tobacco Use  . Smoking status: Current  Some Day Smoker    Packs/day: 0.50    Years: 40.00    Pack years: 20.00    Types: Cigarettes    Last attempt to quit: 01/10/2016    Years since quitting: 4.8  . Smokeless tobacco: Never Used  . Tobacco comment: occasional   Vaping Use  . Vaping Use: Never used  Substance Use Topics  . Alcohol use: Yes    Alcohol/week: 1.0 standard drink    Types: 1 Cans of beer per week    Comment: occasional beer  . Drug use: No    Review of Systems  Constitutional: No fever/chills Eyes: No visual changes. ENT: No sore throat. Cardiovascular: Denies chest pain. Respiratory: Denies shortness of breath. Gastrointestinal: Positive epigastric abdominal pain. Positive nausea and vomiting.  No diarrhea.  No constipation. Genitourinary: Negative for dysuria. Musculoskeletal: Negative for back pain. Skin: Negative for rash. Neurological: Negative for headaches, focal weakness or numbness.  10-point ROS otherwise negative.  ____________________________________________   PHYSICAL EXAM:  VITAL SIGNS: ED Triage Vitals  Enc Vitals Group     BP 10/29/20 1102 133/69     Pulse Rate 10/29/20 1102 81     Resp 10/29/20 1102 18     Temp 10/29/20 1102 98.4 F (36.9 C)     Temp Source 10/29/20 1102 Oral     SpO2 10/29/20 1102 100 %     Weight 10/29/20 1102 121 lb (54.9 kg)     Height 10/29/20 1102 4\' 11"  (1.499 m)   Constitutional: Alert and oriented. Well appearing and in no acute distress. Eyes: Conjunctivae are normal.  Head: Atraumatic. Nose: No congestion/rhinnorhea. Mouth/Throat: Mucous membranes are moist.  Neck: No stridor. Cardiovascular: Normal rate, regular rhythm. Good peripheral circulation. Grossly normal heart sounds.   Respiratory: Normal respiratory effort.  No retractions. Lungs CTAB. Gastrointestinal: Soft with mild diffuse epigastric tenderness. No rebound or guarding. No distention.  Musculoskeletal: No lower extremity tenderness nor edema. No gross deformities of  extremities. Neurologic:  Normal speech and language. No gross focal neurologic deficits are appreciated.  Skin:  Skin is warm, dry and intact. No rash noted.   ____________________________________________   LABS (all labs ordered are listed, but only abnormal results are displayed)  Labs Reviewed  COMPREHENSIVE METABOLIC PANEL - Abnormal; Notable for the following components:      Result Value   Glucose, Bld 120 (*)    All other components within normal limits  CBC - Abnormal; Notable for the following components:   RDW 25.5 (*)    All other components within normal limits  URINALYSIS, ROUTINE W REFLEX MICROSCOPIC - Abnormal; Notable for the following components:   Specific Gravity, Urine >1.046 (*)    Leukocytes,Ua MODERATE (*)    Bacteria, UA RARE (*)    All other components within normal limits  URINE CULTURE  LIPASE, BLOOD   ____________________________________________  RADIOLOGY  CT abdomen/pelvis reviewed. No  acute findings.  ____________________________________________   PROCEDURES  Procedure(s) performed:   Procedures  None  ____________________________________________   INITIAL IMPRESSION / ASSESSMENT AND PLAN / ED COURSE  Pertinent labs & imaging results that were available during my care of the patient were reviewed by me and considered in my medical decision making (see chart for details).   Patient presents to the emergency department with abdominal pain and nausea/vomiting after her upper and lower endoscopy with Dr. Cristina Gong on 3/16.  She is afebrile and well-appearing with normal vital signs.  She has mild epigastric tenderness on exam.  Given her recent endoscopy procedure plan for labs and CT imaging to rule out complication or other intra-abdominal surgical process although lower suspicion for this clinically.  CT abdomen/pelvis is negative. Labs are reassuring. Patient feeling improved here and tolerating PO. Provided a small number of pain pills  after [place] drug database review. Plan for close PCP and GI follow up.  ____________________________________________  FINAL CLINICAL IMPRESSION(S) / ED DIAGNOSES  Final diagnoses:  Generalized abdominal pain  Nausea     MEDICATIONS GIVEN DURING THIS VISIT:  Medications  sodium chloride 0.9 % bolus 500 mL (0 mLs Intravenous Stopped 10/29/20 1404)  morphine 4 MG/ML injection 4 mg (4 mg Intravenous Given 10/29/20 1141)  ondansetron (ZOFRAN) injection 4 mg (4 mg Intravenous Given 10/29/20 1141)  iohexol (OMNIPAQUE) 300 MG/ML solution 100 mL (100 mLs Intravenous Contrast Given 10/29/20 1216)  dicyclomine (BENTYL) capsule 10 mg (10 mg Oral Given 10/29/20 1403)  oxyCODONE-acetaminophen (PERCOCET/ROXICET) 5-325 MG per tablet 1 tablet (1 tablet Oral Given 10/29/20 1403)     NEW OUTPATIENT MEDICATIONS STARTED DURING THIS VISIT:  Discharge Medication List as of 10/29/2020  3:50 PM    START taking these medications   Details  ondansetron (ZOFRAN ODT) 4 MG disintegrating tablet Take 1 tablet (4 mg total) by mouth every 8 (eight) hours as needed for nausea or vomiting., Starting Mon 10/29/2020, Normal        Note:  This document was prepared using Dragon voice recognition software and may include unintentional dictation errors.  Nanda Quinton, MD, [facility] Emergency Medicine    Dawnisha Marquina, [facility], MD 10/30/20 [phone]

## 2020-10-29 NOTE — ED Triage Notes (Signed)
Pt presents with c/o abdominal pain and vomiting for 3 days. Pt reports she has had these symptoms since she had a colonoscopy and endoscopy done on 3/18.

## 2020-10-30 DIAGNOSIS — D122 Benign neoplasm of ascending colon: Secondary | ICD-10-CM | POA: Diagnosis not present

## 2020-10-30 DIAGNOSIS — K2930 Chronic superficial gastritis without bleeding: Secondary | ICD-10-CM | POA: Diagnosis not present

## 2020-10-30 DIAGNOSIS — D123 Benign neoplasm of transverse colon: Secondary | ICD-10-CM | POA: Diagnosis not present

## 2020-10-31 DIAGNOSIS — K219 Gastro-esophageal reflux disease without esophagitis: Secondary | ICD-10-CM | POA: Diagnosis not present

## 2020-10-31 DIAGNOSIS — R109 Unspecified abdominal pain: Secondary | ICD-10-CM | POA: Diagnosis not present

## 2020-10-31 DIAGNOSIS — K224 Dyskinesia of esophagus: Secondary | ICD-10-CM | POA: Diagnosis not present

## 2020-10-31 LAB — URINE CULTURE

## 2020-11-08 DIAGNOSIS — R1013 Epigastric pain: Secondary | ICD-10-CM | POA: Diagnosis not present

## 2020-11-08 DIAGNOSIS — D500 Iron deficiency anemia secondary to blood loss (chronic): Secondary | ICD-10-CM | POA: Diagnosis not present

## 2020-11-08 DIAGNOSIS — K5904 Chronic idiopathic constipation: Secondary | ICD-10-CM | POA: Diagnosis not present

## 2020-11-08 DIAGNOSIS — R1032 Left lower quadrant pain: Secondary | ICD-10-CM | POA: Diagnosis not present

## 2020-11-08 DIAGNOSIS — E118 Type 2 diabetes mellitus with unspecified complications: Secondary | ICD-10-CM | POA: Diagnosis not present

## 2020-12-03 ENCOUNTER — Encounter: Payer: Self-pay | Admitting: Orthopedic Surgery

## 2020-12-03 ENCOUNTER — Ambulatory Visit: Payer: Medicare Other | Admitting: Orthopedic Surgery

## 2020-12-03 VITALS — Ht 59.0 in | Wt 121.0 lb

## 2020-12-03 DIAGNOSIS — M7541 Impingement syndrome of right shoulder: Secondary | ICD-10-CM | POA: Diagnosis not present

## 2020-12-03 DIAGNOSIS — G8929 Other chronic pain: Secondary | ICD-10-CM | POA: Diagnosis not present

## 2020-12-03 DIAGNOSIS — M25511 Pain in right shoulder: Secondary | ICD-10-CM | POA: Diagnosis not present

## 2020-12-03 MED ORDER — LIDOCAINE HCL 1 % IJ SOLN
5.0000 mL | INTRAMUSCULAR | Status: AC | PRN
Start: 2020-12-03 — End: 2020-12-03
  Administered 2020-12-03: 5 mL

## 2020-12-03 MED ORDER — METHYLPREDNISOLONE ACETATE 40 MG/ML IJ SUSP
40.0000 mg | INTRAMUSCULAR | Status: AC | PRN
  Administered 2020-12-03: 40 mg via INTRA_ARTICULAR

## 2020-12-03 NOTE — Progress Notes (Signed)
Office Visit Note   Patient: [name]           Date of Birth: August 30, 1945           MRN: [id number] Visit Date: 12/03/2020              Requested by: [name], MD [place] [employer] [address],  [place] [address] PCP: [name], MD  Chief Complaint  Patient presents with  . Right Shoulder - Pain      HPI: Patient is a 75 year old woman who presents in follow-up for chronic right shoulder pain she is status post previous arthroscopic interventions.  She states that her pain is worse at night pain over the anterior shoulder.  Denies any radicular symptoms.  Assessment & Plan: Visit Diagnoses:  1. Impingement syndrome of right shoulder   2. Chronic right shoulder pain     Plan: Patient's shoulder was injected in the subacromial space she tolerated this well she will follow-up in 3 months if she is still symptomatic.  Follow-Up Instructions: Return if symptoms worsen or fail to improve.   Ortho Exam  Patient is alert, oriented, no adenopathy, well-dressed, normal affect, normal respiratory effort. Examination patient has active abduction flexion to 90 degrees passively range of motion abduction flexion to 90 degrees with internal and external rotation of 70 degrees.  She has pain to palpation of the biceps tendon.  There is crepitation with range of motion.  Her previous radiographs and MRI scan shows inflammation of the rotator cuff as well as glenohumeral arthritis with bony spurs.  Imaging: No results found. No images are attached to the encounter.  Labs: Lab Results  Component Value Date   HGBA1C 6.4 (H) 10/01/2017   HGBA1C 6.7 (H) 07/29/2017   HGBA1C 6.3 (H) 04/19/2014   ESRSEDRATE 9 08/29/2007   REPTSTATUS 10/31/2020 FINAL 10/29/2020   CULT MULTIPLE SPECIES PRESENT, SUGGEST RECOLLECTION (A) 10/29/2020     Lab Results  Component Value Date   ALBUMIN 3.8 10/29/2020   ALBUMIN 3.2 (L) 02/04/2016   ALBUMIN 2.8 (L) 04/19/2014    Lab  Results  Component Value Date   MG 2.0 01/24/2017   MG 2.1 09/21/2013   MG 2.0 03/14/2007   No results found for: VD25OH  No results found for: PREALBUMIN CBC EXTENDED Latest Ref Rng & Units 10/29/2020 05/15/2019 10/01/2017  WBC 4.0 - 10.5 K/uL 4.9 5.8 5.3  RBC 3.87 - 5.11 MIL/uL 4.46 3.77(L) 4.52  HGB 12.0 - 15.0 g/dL 12.3 10.2(L) 13.4  HCT 36.0 - 46.0 % 38.4 32.2(L) 40.4  PLT 150 - 400 K/uL 290 334 294  NEUTROABS 1.7 - 7.7 K/uL - - -  LYMPHSABS 0.7 - 4.0 K/uL - - -     Body mass index is 24.44 kg/m.  Orders:  No orders of the defined types were placed in this encounter.  No orders of the defined types were placed in this encounter.    Procedures: Large Joint Inj: R subacromial bursa on 12/03/2020 9:09 AM Indications: diagnostic evaluation and pain Details: 22 G 1.5 in needle, posterior approach  Arthrogram: No  Medications: 5 mL lidocaine 1 %; 40 mg methylPREDNISolone acetate 40 MG/ML Outcome: tolerated well, no immediate complications Procedure, treatment alternatives, risks and benefits explained, specific risks discussed. Consent was given by the patient. Immediately prior to procedure a time out was called to verify the correct patient, procedure, equipment, support staff and site/side marked as required. Patient was prepped and draped in the usual  sterile fashion.      Clinical Data: No additional findings.  ROS:  All other systems negative, except as noted in the HPI. Review of Systems  Objective: Vital Signs: Ht 4\' 11"  (1.499 m)   Wt 121 lb (54.9 kg)   BMI 24.44 kg/m   Specialty Comments:  No specialty comments available.  PMFS History: Patient Active Problem List   Diagnosis Date Noted  . Variant angina ([place]) 01/24/2017  . Chest pain 01/23/2017  . Headache 01/23/2017  . S/P angioplasty with stent 01/23/2017  . Coronary artery disease 11/24/2014  . Syncope 10/22/2014  . Hyperglycemia 04/20/2014  . H/O hiatal hernia 04/19/2014  . 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 HTN (hypertension) 12/08/2011  . GERD (gastroesophageal reflux disease) 12/08/2011  . Depression 12/08/2011  . Dyslipidemia 12/08/2011   Past Medical History:  Diagnosis Date  . Anemia   . Anginal pain ([place])    last cp was last year  . Anxiety   . Arthritis   . Blood transfusion   . Chest pain 02/04/2016  . Coronary artery disease   . Coronary artery spasm, wth continued episodes of chest pain.  09/26/2013  . Coronary vasospasm ([place])   . Diabetes mellitus without complication ([place])    type II  . GERD (gastroesophageal reflux disease)   . Headache   . Hiatal hernia   . Hypercholesteremia   . Hypertension   . NSTEMI (non-ST elevated myocardial infarction) ([place]) not sure  . NSVT (nonsustained ventricular tachycardia) ([place]) 09/26/2013  . Panic attack     Family History  Problem Relation Age of Onset  . Heart failure Mother   . Stroke Sister   . Heart attack Other     Past Surgical History:  Procedure Laterality Date  . ABDOMINAL HYSTERECTOMY     PARTIAL HYSTERECTOMY  . breast     right, tumor benign  . BREAST EXCISIONAL BIOPSY Right   . CARDIAC CATHETERIZATION  2014  . CARDIAC CATHETERIZATION  2012  . CARDIAC CATHETERIZATION N/A 07/22/2016   Procedure: Left Heart Cath and Coronary Angiography;  Surgeon: Belva Crome, MD;  Location: [place] [facility];  Service: Cardiovascular;  Laterality: N/A;  . cardiac stent  2012   to RCA  . CHOLECYSTECTOMY N/A 10/02/2017   Procedure: LAPAROSCOPIC CHOLECYSTECTOMY;  Surgeon: Ralene Ok, MD;  Location: [facility];  Service: General;  Laterality: N/A;  . CORONARY ANGIOPLASTY    . ESOPHAGEAL MANOMETRY N/A 10/29/2016   Procedure: ESOPHAGEAL MANOMETRY (EM);  Surgeon: Garlan Fair, MD;  [facility];  Service: Endoscopy;  Laterality: N/A;  . ESOPHAGOGASTRODUODENOSCOPY (EGD) WITH PROPOFOL N/A 10/28/2016   Procedure: ESOPHAGOGASTRODUODENOSCOPY (EGD) WITH PROPOFOL;  Surgeon:  Garlan Fair, MD;  [facility];  Service: Endoscopy;  Laterality: N/A;  . EXCISION OF SKIN TAG N/A 07/29/2017   Procedure: EXCISION OF PERIANAL  SKIN TAG;  Surgeon: Ralene Ok, MD;  Location: [place];  Service: General;  Laterality: N/A;  . KNEE SURGERY  left  . left ear surgery     for bad cut  . LEFT HEART CATHETERIZATION WITH CORONARY ANGIOGRAM N/A 02/22/2013   Procedure: LEFT HEART CATHETERIZATION WITH CORONARY ANGIOGRAM;  Surgeon: [name], MD;  Location: [facility] CATH LAB;  Service: Cardiovascular;  Laterality: N/A;  . SHOULDER SURGERY Bilateral    rotator cuff   Social History   Occupational History  . Not on file  Tobacco Use  . Smoking status:  Current Some Day Smoker    Packs/day: 0.50    Years: 40.00    Pack years: 20.00    Types: Cigarettes    Last attempt to quit: 01/10/2016    Years since quitting: 4.9  . Smokeless tobacco: Never Used  . Tobacco comment: occasional   Vaping Use  . Vaping Use: Never used  Substance and Sexual Activity  . Alcohol use: Yes    Alcohol/week: 1.0 standard drink    Types: 1 Cans of beer per week    Comment: occasional beer  . Drug use: No  . Sexual activity: Never

## 2020-12-07 ENCOUNTER — Other Ambulatory Visit (HOSPITAL_COMMUNITY): Payer: Self-pay | Admitting: Physician Assistant

## 2020-12-07 ENCOUNTER — Other Ambulatory Visit: Payer: Self-pay | Admitting: Physician Assistant

## 2020-12-07 DIAGNOSIS — R1013 Epigastric pain: Secondary | ICD-10-CM | POA: Diagnosis not present

## 2020-12-07 DIAGNOSIS — R1310 Dysphagia, unspecified: Secondary | ICD-10-CM | POA: Diagnosis not present

## 2020-12-07 DIAGNOSIS — K5904 Chronic idiopathic constipation: Secondary | ICD-10-CM | POA: Diagnosis not present

## 2020-12-07 DIAGNOSIS — D509 Iron deficiency anemia, unspecified: Secondary | ICD-10-CM | POA: Diagnosis not present

## 2020-12-08 DIAGNOSIS — E118 Type 2 diabetes mellitus with unspecified complications: Secondary | ICD-10-CM | POA: Diagnosis not present

## 2020-12-11 DIAGNOSIS — R1013 Epigastric pain: Secondary | ICD-10-CM | POA: Diagnosis not present

## 2020-12-12 DIAGNOSIS — D509 Iron deficiency anemia, unspecified: Secondary | ICD-10-CM | POA: Diagnosis not present

## 2020-12-12 DIAGNOSIS — E782 Mixed hyperlipidemia: Secondary | ICD-10-CM | POA: Diagnosis not present

## 2020-12-12 DIAGNOSIS — K219 Gastro-esophageal reflux disease without esophagitis: Secondary | ICD-10-CM | POA: Diagnosis not present

## 2020-12-12 DIAGNOSIS — I10 Essential (primary) hypertension: Secondary | ICD-10-CM | POA: Diagnosis not present

## 2020-12-12 DIAGNOSIS — G4700 Insomnia, unspecified: Secondary | ICD-10-CM | POA: Diagnosis not present

## 2020-12-12 DIAGNOSIS — D500 Iron deficiency anemia secondary to blood loss (chronic): Secondary | ICD-10-CM | POA: Diagnosis not present

## 2020-12-12 DIAGNOSIS — E1169 Type 2 diabetes mellitus with other specified complication: Secondary | ICD-10-CM | POA: Diagnosis not present

## 2020-12-12 DIAGNOSIS — M1990 Unspecified osteoarthritis, unspecified site: Secondary | ICD-10-CM | POA: Diagnosis not present

## 2020-12-12 DIAGNOSIS — I2510 Atherosclerotic heart disease of native coronary artery without angina pectoris: Secondary | ICD-10-CM | POA: Diagnosis not present

## 2020-12-12 DIAGNOSIS — I25111 Atherosclerotic heart disease of native coronary artery with angina pectoris with documented spasm: Secondary | ICD-10-CM | POA: Diagnosis not present

## 2020-12-12 DIAGNOSIS — I252 Old myocardial infarction: Secondary | ICD-10-CM | POA: Diagnosis not present

## 2020-12-18 ENCOUNTER — Telehealth: Payer: Self-pay | Admitting: Orthopedic Surgery

## 2020-12-18 NOTE — Telephone Encounter (Signed)
Pt calling about how her injection in her arm, stating did not work and she would like to know if Dr.Duda can call her something in! Gabapentin is not working for her. She would like someone to give her a call [phone]

## 2020-12-19 MED ORDER — OXYCODONE-ACETAMINOPHEN 5-325 MG PO TABS: 1.0000 | ORAL_TABLET | Freq: Three times a day (TID) | ORAL | 0 refills | Status: DC | PRN

## 2020-12-19 NOTE — Addendum Note (Signed)
Addended by: Dondra Prader R on: 12/19/2020 03:52 PM   Modules accepted: Orders

## 2020-12-19 NOTE — Telephone Encounter (Signed)
Can you please review below and advise.

## 2020-12-26 ENCOUNTER — Other Ambulatory Visit: Payer: Self-pay

## 2020-12-26 ENCOUNTER — Ambulatory Visit (HOSPITAL_COMMUNITY)
Admission: RE | Admit: 2020-12-26 | Discharge: 2020-12-26 | Disposition: A | Discharge status: Home / Self Care | Payer: Medicare Other | Source: Ambulatory Visit | Attending: Physician Assistant | Admitting: Physician Assistant

## 2020-12-26 DIAGNOSIS — R1013 Epigastric pain: Secondary | ICD-10-CM | POA: Diagnosis not present

## 2020-12-26 DIAGNOSIS — R109 Unspecified abdominal pain: Secondary | ICD-10-CM | POA: Diagnosis not present

## 2020-12-26 MED ORDER — TECHNETIUM TC 99M SULFUR COLLOID
2.0000 | Freq: Once | INTRAVENOUS | Status: AC | PRN
  Administered 2020-12-26: 2 via INTRAVENOUS

## 2020-12-28 ENCOUNTER — Other Ambulatory Visit: Payer: Medicare Other

## 2021-01-08 DIAGNOSIS — E118 Type 2 diabetes mellitus with unspecified complications: Secondary | ICD-10-CM | POA: Diagnosis not present

## 2021-01-15 ENCOUNTER — Ambulatory Visit
Admission: RE | Admit: 2021-01-15 | Discharge: 2021-01-15 | Disposition: A | Discharge status: Home / Self Care | Payer: Medicare Other | Source: Ambulatory Visit | Attending: Physician Assistant | Admitting: Physician Assistant

## 2021-01-15 DIAGNOSIS — R1310 Dysphagia, unspecified: Secondary | ICD-10-CM

## 2021-02-07 DIAGNOSIS — E118 Type 2 diabetes mellitus with unspecified complications: Secondary | ICD-10-CM | POA: Diagnosis not present

## 2021-02-08 DIAGNOSIS — E782 Mixed hyperlipidemia: Secondary | ICD-10-CM | POA: Diagnosis not present

## 2021-02-08 DIAGNOSIS — M1990 Unspecified osteoarthritis, unspecified site: Secondary | ICD-10-CM | POA: Diagnosis not present

## 2021-02-08 DIAGNOSIS — I2510 Atherosclerotic heart disease of native coronary artery without angina pectoris: Secondary | ICD-10-CM | POA: Diagnosis not present

## 2021-02-08 DIAGNOSIS — I25111 Atherosclerotic heart disease of native coronary artery with angina pectoris with documented spasm: Secondary | ICD-10-CM | POA: Diagnosis not present

## 2021-02-08 DIAGNOSIS — I252 Old myocardial infarction: Secondary | ICD-10-CM | POA: Diagnosis not present

## 2021-02-08 DIAGNOSIS — D500 Iron deficiency anemia secondary to blood loss (chronic): Secondary | ICD-10-CM | POA: Diagnosis not present

## 2021-02-08 DIAGNOSIS — G4700 Insomnia, unspecified: Secondary | ICD-10-CM | POA: Diagnosis not present

## 2021-02-08 DIAGNOSIS — E1169 Type 2 diabetes mellitus with other specified complication: Secondary | ICD-10-CM | POA: Diagnosis not present

## 2021-02-08 DIAGNOSIS — D509 Iron deficiency anemia, unspecified: Secondary | ICD-10-CM | POA: Diagnosis not present

## 2021-02-08 DIAGNOSIS — K219 Gastro-esophageal reflux disease without esophagitis: Secondary | ICD-10-CM | POA: Diagnosis not present

## 2021-02-08 DIAGNOSIS — I10 Essential (primary) hypertension: Secondary | ICD-10-CM | POA: Diagnosis not present

## 2021-02-12 NOTE — Progress Notes (Signed)
Cardiology Office Note:    Date:  02/13/2021   ID:  [name], DOB 1946/02/21, MRN [id number]  PCP:  [name], MD  Cardiologist:  [name], MD   Referring MD: [name], MD   Chief Complaint  Patient presents with   Coronary Artery Disease   Hyperlipidemia   Hypertension    History of Present Illness:    [name] is a 75 y.o. female with a hx of prior obstructive coronary disease with prior stenting, well documented recurrent coronary spasm, recurrent syncope with neurally mediated features, hypertension, hyperlipidemia, tobacco abuse, and gastroesophageal reflux.  She is doing well.  She does have coronary artery spasm that has been provoked at times by cigarette smoking.  She had discontinued smoking, however illness in her family led to stress and recurrence of smoking.  Now states that she smokes 1 pack every 2 weeks.  No recent visits for angina or unstable angina.  She needs a refill on ranolazine.  She has not needed sublingual nitroglycerin recently.   Past Medical History:  Diagnosis Date   Anemia    Anginal pain ([place])    last cp was last year   Anxiety    Arthritis    Blood transfusion    Chest pain 02/04/2016   Coronary artery disease    Coronary artery spasm, wth continued episodes of chest pain.  09/26/2013   Coronary vasospasm (HCC)    Diabetes mellitus without complication (HCC)    type II   GERD (gastroesophageal reflux disease)    Headache    Hiatal hernia    Hypercholesteremia    Hypertension    NSTEMI (non-ST elevated myocardial infarction) ([place]) not sure   NSVT (nonsustained ventricular tachycardia) ([place]) 09/26/2013   Panic attack     Past Surgical History:  Procedure Laterality Date   ABDOMINAL HYSTERECTOMY     PARTIAL HYSTERECTOMY   breast     right, tumor benign   BREAST EXCISIONAL BIOPSY Right    CARDIAC CATHETERIZATION  2014   CARDIAC CATHETERIZATION  2012   CARDIAC CATHETERIZATION N/A 07/22/2016    Procedure: Left Heart Cath and Coronary Angiography;  Surgeon: Belva Crome, MD;  Location: [facility];  Service: Cardiovascular;  Laterality: N/A;   cardiac stent  2012   to RCA   CHOLECYSTECTOMY N/A 10/02/2017   Procedure: LAPAROSCOPIC CHOLECYSTECTOMY;  Surgeon: Ralene Ok, MD;  Location: [facility];  Service: General;  Laterality: N/A;   CORONARY ANGIOPLASTY     ESOPHAGEAL MANOMETRY N/A 10/29/2016   Procedure: ESOPHAGEAL MANOMETRY (EM);  Surgeon: Garlan Fair, MD;  [facility];  Service: Endoscopy;  Laterality: N/A;   ESOPHAGOGASTRODUODENOSCOPY (EGD) WITH PROPOFOL N/A 10/28/2016   Procedure: ESOPHAGOGASTRODUODENOSCOPY (EGD) WITH PROPOFOL;  Surgeon: Garlan Fair, MD;  [facility];  Service: Endoscopy;  Laterality: N/A;   EXCISION OF SKIN TAG N/A 07/29/2017   Procedure: EXCISION OF PERIANAL  SKIN TAG;  Surgeon: Ralene Ok, MD;  Location: [facility];  Service: General;  Laterality: N/A;   KNEE SURGERY  left   left ear surgery     for bad cut   LEFT HEART CATHETERIZATION WITH CORONARY ANGIOGRAM N/A 02/22/2013   Procedure: LEFT HEART CATHETERIZATION WITH CORONARY ANGIOGRAM;  Surgeon: [name], MD;  Location: [facility];  Service: Cardiovascular;  Laterality: N/A;   SHOULDER SURGERY Bilateral    rotator cuff    Current Medications: Current Meds  Medication Sig   acetaminophen (TYLENOL) 500  MG tablet Take 500 mg by mouth daily as needed for moderate pain or headache.   amLODipine (NORVASC) 2.5 MG tablet Take 1 tablet (2.5 mg total) by mouth daily.   aspirin EC 81 MG tablet Take 81 mg by mouth daily.   buPROPion (WELLBUTRIN SR) 150 MG 12 hr tablet Take 150 mg by mouth daily. For smoking cessation   buPROPion (ZYBAN) 150 MG 12 hr tablet Take 150 mg by mouth every morning.   clonazePAM (KLONOPIN) 0.5 MG tablet Take 0.5 mg by mouth 2 (two) times daily as needed for anxiety.    isosorbide mononitrate (IMDUR) 60 MG 24 hr tablet TAKE 1 AND 1/2 TABLETS  BY MOUTH DAILY. NEED TO SCHEDULE YEARLY APPOINTMENT FOR FURTHER REFILLS   metFORMIN (GLUCOPHAGE) 500 MG tablet Take 500 mg by mouth 2 (two) times daily.   metFORMIN (GLUCOPHAGE-XR) 500 MG 24 hr tablet Take 500 mg by mouth 2 (two) times daily.   nitroGLYCERIN (NITROSTAT) 0.4 MG SL tablet PLACE 1 TABLET UNDER TONGUE AS NEEDED FOR CHEST PAIN EVERY 5 MINUTES UP TO 3 DOSES THEN SEEK MEDICAL ATTENTION. Please make overdue appt. 1st attempt   pantoprazole (PROTONIX) 40 MG tablet Take 1 tablet (40 mg total) by mouth 2 (two) times daily.   ranitidine (ZANTAC) 150 MG tablet Take 150 mg by mouth 2 (two) times daily.    rosuvastatin (CRESTOR) 5 MG tablet Take 1 tablet (5 mg total) by mouth daily at 6 PM.   senna-docusate (SENOKOT-S) 8.6-50 MG tablet Take 1 tablet by mouth at bedtime as needed for mild constipation or moderate constipation.     Allergies:   Patient has no known allergies.   Social History   Socioeconomic History   Marital status: Married    Spouse name: [name]   Number of children: 0   Years of education: Not on file   Highest education level: Not on file  Occupational History   Not on file  Tobacco Use   Smoking status: Some Days    Packs/day: 0.50    Years: 40.00    Pack years: 20.00    Types: Cigarettes    Last attempt to quit: 01/10/2016    Years since quitting: 5.0   Smokeless tobacco: Never   Tobacco comments:    occasional   Vaping Use   Vaping Use: Never used  Substance and Sexual Activity   Alcohol use: Yes    Alcohol/week: 1.0 standard drink    Types: 1 Cans of beer per week    Comment: occasional beer   Drug use: No   Sexual activity: Never  Other Topics Concern   Not on file  Social History Narrative   Lives with husband. Ambulates independently.   Social Determinants of Health   Financial Resource Strain: Not on file  Food Insecurity: Not on file  Transportation Needs: Not on file  Physical Activity: Not on file  Stress: Not on file  Social  Connections: Not on file     Family History: The patient's family history includes Heart attack in an other family member; Heart failure in her mother; Stroke in her sister.  ROS:   Please see the history of present illness.    Does not sleep well.  Dizziness and weakness intermittently.  Possibly related to amlodipine and or Imdur.  Under stress at home.  All other systems reviewed and are negative.  EKGs/Labs/Other Studies Reviewed:    The following studies were reviewed today: Recent laboratory data from January 2022: LDL  57 Hemoglobin A1c 6.4 Creatinine 0.74  EKG:  EKG normal sinus rhythm, left atrial abnormality, normal-appearing EKG.  Prior study from 05/15/2019, reveals no significant change.  Recent Labs: 10/29/2020: ALT 15; BUN 9; Creatinine, Ser 0.74; Hemoglobin 12.3; Platelets 290; Potassium 4.7; Sodium 138  Recent Lipid Panel    Component Value Date/Time   CHOL 158 02/05/2016 0413   TRIG 103 02/05/2016 0413   HDL 73 02/05/2016 0413   CHOLHDL 2.2 02/05/2016 0413   VLDL 21 02/05/2016 0413   LDLCALC 64 02/05/2016 0413    Physical Exam:    VS:  BP 122/70   Pulse (!) 59   Ht 4\' 11"  (1.499 m)   Wt 119 lb 12.8 oz (54.3 kg)   SpO2 98%   BMI 24.20 kg/m     Wt Readings from Last 3 Encounters:  02/13/21 119 lb 12.8 oz (54.3 kg)  12/03/20 121 lb (54.9 kg)  10/29/20 121 lb (54.9 kg)     GEN: Healthy appearing younger than stated age. No acute distress HEENT: Normal NECK: No JVD. LYMPHATICS: No lymphadenopathy CARDIAC: No murmur. RRR no gallop, or edema. VASCULAR:  Normal Pulses. No bruits. RESPIRATORY:  Clear to auscultation without rales, wheezing or rhonchi  ABDOMEN: Soft, non-tender, non-distended, No pulsatile mass, MUSCULOSKELETAL: No deformity  SKIN: Warm and dry NEUROLOGIC:  Alert and oriented x 3 PSYCHIATRIC:  Normal affect   ASSESSMENT:    1. Coronary artery vasospasm ([place])   2. Coronary artery disease of native artery of native heart with stable  angina pectoris ([place])   3. Dyslipidemia   4. H/O hiatal hernia   5. Primary hypertension    PLAN:    In order of problems listed above:  Coronary vasoreactivity with prior episodes of spasm associated with heavy cigarette smoking and stress.  Had discontinued smoking but recently resumed after her son became acutely ill.  She is working on smoking cessation again. She did have stent implantation greater than 18 years ago most likely in a region of spasm.  This was done assuming that the stenosis noted was due to fixed obstructive disease however subsequent history revealed that spasm was the most likely cause of the lesion. Target LDL less than 70 is being achieved on current statin therapy. Has esophageal reflux and difficulty swallowing.  Being managed by GI at [facility]. Blood pressures well controlled on current therapy.  Overall education and awareness concerning secondary risk prevention was discussed in detail: LDL less than 70, hemoglobin A1c less than 7, blood pressure target less than 130/80 mmHg, >150 minutes of moderate aerobic activity per week, avoidance of smoking, weight control (via diet and exercise), and continued surveillance/management of/for obstructive sleep apnea.    Medication Adjustments/Labs and Tests Ordered: Current medicines are reviewed at length with the patient today.  Concerns regarding medicines are outlined above.  Orders Placed This Encounter  Procedures   EKG 12-Lead   No orders of the defined types were placed in this encounter.   There are no Patient Instructions on file for this visit.   Signed, [name], MD  02/13/2021 8:54 AM    [place]

## 2021-02-13 ENCOUNTER — Other Ambulatory Visit: Payer: Self-pay

## 2021-02-13 ENCOUNTER — Encounter: Payer: Self-pay | Admitting: Interventional Cardiology

## 2021-02-13 ENCOUNTER — Ambulatory Visit: Payer: Medicare Other | Admitting: Interventional Cardiology

## 2021-02-13 ENCOUNTER — Encounter (INDEPENDENT_AMBULATORY_CARE_PROVIDER_SITE_OTHER): Payer: Self-pay

## 2021-02-13 VITALS — BP 122/70 | HR 59 | Ht 59.0 in | Wt 119.8 lb

## 2021-02-13 DIAGNOSIS — I201 Angina pectoris with documented spasm: Secondary | ICD-10-CM

## 2021-02-13 DIAGNOSIS — E785 Hyperlipidemia, unspecified: Secondary | ICD-10-CM | POA: Diagnosis not present

## 2021-02-13 DIAGNOSIS — Z8719 Personal history of other diseases of the digestive system: Secondary | ICD-10-CM | POA: Diagnosis not present

## 2021-02-13 DIAGNOSIS — I10 Essential (primary) hypertension: Secondary | ICD-10-CM | POA: Diagnosis not present

## 2021-02-13 DIAGNOSIS — I25118 Atherosclerotic heart disease of native coronary artery with other forms of angina pectoris: Secondary | ICD-10-CM

## 2021-02-13 MED ORDER — RANOLAZINE ER 500 MG PO TB12: ORAL_TABLET | ORAL | 11 refills | Status: DC

## 2021-02-13 NOTE — Patient Instructions (Signed)
Medication Instructions:  Your physician recommends that you continue on your current medications as directed. Please refer to the Current Medication list given to you today.  *If you need a refill on your cardiac medications before your next appointment, please call your pharmacy*   Lab Work: If you have labs (blood work) drawn today and your tests are completely normal, you will receive your results only by: [place] (if you have MyChart) OR A paper copy in the mail If you have any lab test that is abnormal or we need to change your treatment, we will call you to review the results.  Follow-Up: At [facility], you and your health needs are our priority.  As part of our continuing mission to provide you with exceptional heart care, we have created designated Provider Care Teams.  These Care Teams include your primary Cardiologist (physician) and Advanced Practice Providers (APPs -  Physician Assistants and Nurse Practitioners) who all work together to provide you with the care you need, when you need it.  We recommend signing up for the patient portal called "MyChart".  Sign up information is provided on this After Visit Summary.  MyChart is used to connect with patients for Virtual Visits (Telemedicine).  Patients are able to view lab/test results, encounter notes, upcoming appointments, etc.  Non-urgent messages can be sent to your provider as well.   To learn more about what you can do with MyChart, go to [email or web address].    Your next appointment:   1 year(s)  The format for your next appointment:   In Person  Provider:   You may see [name], MD or one of the following Advanced Practice Providers on your designated Care Team:   Kathyrn Drown, NP

## 2021-02-22 DIAGNOSIS — I739 Peripheral vascular disease, unspecified: Secondary | ICD-10-CM | POA: Diagnosis not present

## 2021-02-22 DIAGNOSIS — E1169 Type 2 diabetes mellitus with other specified complication: Secondary | ICD-10-CM | POA: Diagnosis not present

## 2021-02-22 DIAGNOSIS — I25111 Atherosclerotic heart disease of native coronary artery with angina pectoris with documented spasm: Secondary | ICD-10-CM | POA: Diagnosis not present

## 2021-02-22 DIAGNOSIS — I10 Essential (primary) hypertension: Secondary | ICD-10-CM | POA: Diagnosis not present

## 2021-02-22 DIAGNOSIS — E782 Mixed hyperlipidemia: Secondary | ICD-10-CM | POA: Diagnosis not present

## 2021-02-22 DIAGNOSIS — G4700 Insomnia, unspecified: Secondary | ICD-10-CM | POA: Diagnosis not present

## 2021-02-22 DIAGNOSIS — I252 Old myocardial infarction: Secondary | ICD-10-CM | POA: Diagnosis not present

## 2021-02-22 DIAGNOSIS — M5090 Cervical disc disorder, unspecified, unspecified cervical region: Secondary | ICD-10-CM | POA: Diagnosis not present

## 2021-02-22 DIAGNOSIS — K219 Gastro-esophageal reflux disease without esophagitis: Secondary | ICD-10-CM | POA: Diagnosis not present

## 2021-02-22 DIAGNOSIS — I700 Atherosclerosis of aorta: Secondary | ICD-10-CM | POA: Diagnosis not present

## 2021-02-28 ENCOUNTER — Other Ambulatory Visit: Payer: Self-pay | Admitting: Internal Medicine

## 2021-02-28 DIAGNOSIS — Z1231 Encounter for screening mammogram for malignant neoplasm of breast: Secondary | ICD-10-CM

## 2021-03-06 ENCOUNTER — Encounter: Payer: Self-pay | Admitting: Family

## 2021-03-06 ENCOUNTER — Ambulatory Visit: Payer: Self-pay

## 2021-03-06 ENCOUNTER — Ambulatory Visit: Payer: Medicare Other | Admitting: Family

## 2021-03-06 DIAGNOSIS — M542 Cervicalgia: Secondary | ICD-10-CM | POA: Diagnosis not present

## 2021-03-06 DIAGNOSIS — M7541 Impingement syndrome of right shoulder: Secondary | ICD-10-CM | POA: Diagnosis not present

## 2021-03-06 MED ORDER — METHYLPREDNISOLONE ACETATE 40 MG/ML IJ SUSP
40.0000 mg | INTRAMUSCULAR | Status: AC | PRN
  Administered 2021-03-06: 40 mg via INTRA_ARTICULAR

## 2021-03-06 MED ORDER — LIDOCAINE HCL 1 % IJ SOLN
5.0000 mL | INTRAMUSCULAR | Status: AC | PRN
  Administered 2021-03-06: 5 mL

## 2021-03-06 MED ORDER — PREDNISONE 50 MG PO TABS: ORAL_TABLET | ORAL | 0 refills | Status: DC

## 2021-03-06 NOTE — Progress Notes (Signed)
Office Visit Note   Patient: [name]           Date of Birth: December 04, 1945           MRN: [id number] Visit Date: 03/06/2021              Requested by: [name], MD [place] [employer] [place] [address],  [place] [address] PCP: [name], MD  Chief Complaint  Patient presents with   Right Shoulder - Follow-up    S/p injection 12/03/20      HPI:  The patient is a 75 year old woman who presents today complaining of right shoulder pain which is chronic for her.  She has had previous arthroscopic interventions on bilateral shoulders her pain is worse at night she has pain over her anterior shoulder.  She has had relief with Depo-Medrol injections in the past she states her last injection in April was not as helpful as previous ones.  Today she is also complaining of some right-sided neck pain pain that radiates down her arm all the way into her hand she states she wakes with numbness of all 5 fingers each day she is also having some associated weakness she is dropped her coffee cup and feels she needs to use her left hand to steady her right and assist with holding onto things over the last several weeks.  She is right-hand dominant   Assessment & Plan: Visit Diagnoses:  1. Cervicalgia   2. Neck pain   3. Impingement syndrome of right shoulder     Plan: Patient's shoulder was injected in the subacromial space.  she tolerated this well.  We will also sending prednisone for her cervicalgia and radiculopathy we will check on her in 1 week  Follow-Up Instructions: Return in about 4 weeks (around 04/03/2021).   Back Exam   Tenderness  The patient is experiencing tenderness in the thoracic.  Comments:  Positive Spurling   Right Hand Exam   Muscle Strength  Grip: 4/5   Other  Sensation: normal   Left Hand Exam   Muscle Strength  Grip:  5/5   Other  Sensation: normal     Patient is alert, oriented, no adenopathy, well-dressed, normal affect, normal  respiratory effort. Examination patient has active abduction flexion to 90 degrees passively range of motion abduction flexion to 90 degrees with internal and external rotation of 70 degrees.  She has pain to palpation of the biceps tendon.  There is crepitation with range of motion.     Her previous radiographs and MRI scan shows inflammation of the rotator cuff as well as glenohumeral arthritis with bony spurs.  Imaging: No results found. No images are attached to the encounter.  Labs: Lab Results  Component Value Date   HGBA1C 6.4 (H) 10/01/2017   HGBA1C 6.7 (H) 07/29/2017   HGBA1C 6.3 (H) 04/19/2014   ESRSEDRATE 9 08/29/2007   REPTSTATUS 10/31/2020 FINAL 10/29/2020   CULT MULTIPLE SPECIES PRESENT, SUGGEST RECOLLECTION (A) 10/29/2020     Lab Results  Component Value Date   ALBUMIN 3.8 10/29/2020   ALBUMIN 3.2 (L) 02/04/2016   ALBUMIN 2.8 (L) 04/19/2014    Lab Results  Component Value Date   MG 2.0 01/24/2017   MG 2.1 09/21/2013   MG 2.0 03/14/2007   No results found for: VD25OH  No results found for: PREALBUMIN CBC EXTENDED Latest Ref Rng & Units 10/29/2020 05/15/2019 10/01/2017  WBC 4.0 - 10.5 K/uL 4.9 5.8 5.3  RBC 3.87 - 5.11  MIL/uL 4.46 3.77(L) 4.52  HGB 12.0 - 15.0 g/dL 12.3 10.2(L) 13.4  HCT 36.0 - 46.0 % 38.4 32.2(L) 40.4  PLT 150 - 400 K/uL 290 334 294  NEUTROABS 1.7 - 7.7 K/uL - - -  LYMPHSABS 0.7 - 4.0 K/uL - - -     There is no height or weight on file to calculate BMI.  Orders:  Orders Placed This Encounter  Procedures   XR Cervical Spine 2 or 3 views    Meds ordered this encounter  Medications   predniSONE (DELTASONE) 50 MG tablet    Sig: Take one tablet by mouth once daily for 5 days.    Dispense:  5 tablet    Refill:  0      Procedures: Large Joint Inj: R subacromial bursa on 03/06/2021 4:18 PM Indications: pain Details: 22 G 1.5 in needle Medications: 5 mL lidocaine 1 %; 40 mg methylPREDNISolone acetate 40 MG/ML Consent was given by  the patient.     Clinical Data: No additional findings.  ROS:  All other systems negative, except as noted in the HPI. Review of Systems  Constitutional:  Negative for chills and fever.  Musculoskeletal:  Positive for arthralgias, myalgias and neck pain. Negative for neck stiffness.  Neurological:  Positive for weakness and numbness.   Objective: Vital Signs: There were no vitals taken for this visit.  Specialty Comments:  No specialty comments available.  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  09/26/2013   Coronary vasospasm (H[facility])    Diabetes mellitus without complication (HCC)    type II   GERD (gastroesophageal reflux disease)    Headache    Hiatal hernia    Hypercholesteremia    Hypertension    NSTEMI (non-ST elevated myocardial infarction) ([place]) not sure   NSVT (nonsustained ventricular tachycardia) ([place]) 09/26/2013   Panic attack     Family History  Problem Relation Age of Onset   Heart failure Mother    Stroke Sister    Heart attack Other     Past Surgical History:  Procedure Laterality Date   ABDOMINAL HYSTERECTOMY     PARTIAL HYSTERECTOMY   breast     right, tumor benign   BREAST EXCISIONAL BIOPSY Right    CARDIAC CATHETERIZATION  2014    CARDIAC CATHETERIZATION  2012   CARDIAC CATHETERIZATION N/A 07/22/2016   Procedure: Left Heart Cath and Coronary Angiography;  Surgeon: Belva Crome, MD;  Location: [facility];  Service: Cardiovascular;  Laterality: N/A;   cardiac stent  2012   to RCA   CHOLECYSTECTOMY N/A 10/02/2017   Procedure: LAPAROSCOPIC CHOLECYSTECTOMY;  Surgeon: Ralene Ok, MD;  Location: [facility];  Service: General;  Laterality: N/A;   CORONARY ANGIOPLASTY     ESOPHAGEAL MANOMETRY N/A 10/29/2016   Procedure: ESOPHAGEAL MANOMETRY (EM);  Surgeon: Garlan Fair, MD;  [facility];  Service: Endoscopy;  Laterality: N/A;   ESOPHAGOGASTRODUODENOSCOPY (EGD) WITH PROPOFOL N/A 10/28/2016   Procedure: ESOPHAGOGASTRODUODENOSCOPY (EGD) WITH PROPOFOL;  Surgeon: Garlan Fair, MD;  [facility];  Service: Endoscopy;  Laterality: N/A;   EXCISION OF SKIN TAG N/A 07/29/2017   Procedure: EXCISION OF PERIANAL  SKIN TAG;  Surgeon: Ralene Ok, MD;  Location: [facility];  Service: General;  Laterality: N/A;   KNEE SURGERY  left   left ear surgery     for bad cut   LEFT HEART CATHETERIZATION WITH CORONARY ANGIOGRAM N/A 02/22/2013   Procedure: LEFT HEART CATHETERIZATION WITH CORONARY ANGIOGRAM;  Surgeon: [name], MD;  Location: [facility];  Service: Cardiovascular;  Laterality: N/A;   SHOULDER SURGERY Bilateral    rotator cuff   Social History   Occupational History   Not on file  Tobacco Use   Smoking status: Some Days    Packs/day: 0.50    Years: 40.00    Pack years: 20.00    Types: Cigarettes    Last attempt to quit: 01/10/2016    Years since quitting: 5.1   Smokeless tobacco: Never   Tobacco comments:    occasional   Vaping Use   Vaping Use: Never used  Substance and Sexual Activity   Alcohol use: Yes    Alcohol/week: 1.0 standard drink    Types: 1 Cans of beer per week    Comment: occasional beer   Drug use: No   Sexual activity: Never

## 2021-03-08 DIAGNOSIS — R1310 Dysphagia, unspecified: Secondary | ICD-10-CM | POA: Diagnosis not present

## 2021-03-08 DIAGNOSIS — R1013 Epigastric pain: Secondary | ICD-10-CM | POA: Diagnosis not present

## 2021-03-08 DIAGNOSIS — K5900 Constipation, unspecified: Secondary | ICD-10-CM | POA: Diagnosis not present

## 2021-03-10 DIAGNOSIS — E118 Type 2 diabetes mellitus with unspecified complications: Secondary | ICD-10-CM | POA: Diagnosis not present

## 2021-03-15 ENCOUNTER — Telehealth: Payer: Self-pay | Admitting: Family

## 2021-03-15 DIAGNOSIS — M7541 Impingement syndrome of right shoulder: Secondary | ICD-10-CM

## 2021-03-15 DIAGNOSIS — G8929 Other chronic pain: Secondary | ICD-10-CM

## 2021-03-15 NOTE — Telephone Encounter (Signed)
Pt called saying the injection she got did not work and she wants someone to call her back.  CB [id number]

## 2021-03-15 NOTE — Telephone Encounter (Signed)
You gave injection at last visit please advise next steps.

## 2021-03-26 NOTE — Addendum Note (Signed)
Addended by: Suzan Slick on: 03/26/2021 08:42 AM   Modules accepted: Orders

## 2021-04-10 DIAGNOSIS — E118 Type 2 diabetes mellitus with unspecified complications: Secondary | ICD-10-CM | POA: Diagnosis not present

## 2021-04-12 ENCOUNTER — Ambulatory Visit
Admission: RE | Admit: 2021-04-12 | Discharge: 2021-04-12 | Disposition: A | Discharge status: Home / Self Care | Payer: Medicare Other | Source: Ambulatory Visit | Attending: Family | Admitting: Family

## 2021-04-12 ENCOUNTER — Other Ambulatory Visit: Payer: Self-pay

## 2021-04-12 DIAGNOSIS — Z9889 Other specified postprocedural states: Secondary | ICD-10-CM | POA: Diagnosis not present

## 2021-04-12 DIAGNOSIS — R600 Localized edema: Secondary | ICD-10-CM | POA: Diagnosis not present

## 2021-04-12 DIAGNOSIS — M7541 Impingement syndrome of right shoulder: Secondary | ICD-10-CM

## 2021-04-12 DIAGNOSIS — G8929 Other chronic pain: Secondary | ICD-10-CM

## 2021-04-12 DIAGNOSIS — M19011 Primary osteoarthritis, right shoulder: Secondary | ICD-10-CM | POA: Diagnosis not present

## 2021-04-22 ENCOUNTER — Encounter: Payer: Self-pay | Admitting: Orthopedic Surgery

## 2021-04-22 ENCOUNTER — Ambulatory Visit: Payer: Medicare Other | Admitting: Orthopedic Surgery

## 2021-04-22 VITALS — Ht 59.0 in | Wt 120.0 lb

## 2021-04-22 DIAGNOSIS — G8929 Other chronic pain: Secondary | ICD-10-CM

## 2021-04-22 DIAGNOSIS — M7541 Impingement syndrome of right shoulder: Secondary | ICD-10-CM

## 2021-04-22 DIAGNOSIS — M25511 Pain in right shoulder: Secondary | ICD-10-CM | POA: Diagnosis not present

## 2021-04-22 NOTE — Progress Notes (Addendum)
Office Visit Note   Patient: [name]           Date of Birth: 1945-12-14           MRN: [id number] Visit Date: 04/22/2021              Requested by: [name], MD [place] [employer] [address] [place],   [address] PCP: [name], MD  Chief Complaint  Patient presents with   Right Shoulder - Follow-up    MRI right shoulder review      HPI: Patient is a 75 year old woman who presents in follow-up for chronic right shoulder pain.  She is status post previous arthroscopic debridement most recently she has undergone 3 subacromial injections she states the first provided good relief she states the second 2 have not provided any relief.  Assessment & Plan: Visit Diagnoses:  1. Impingement syndrome of right shoulder   2. Chronic right shoulder pain     Plan: We will set patient up for physical therapy for scapular stabilization and rotator cuff strengthening.  Follow-Up Instructions: Return in about 4 weeks (around 05/20/2021).   Ortho Exam  Patient is alert, oriented, no adenopathy, well-dressed, normal affect, normal respiratory effort. Examination patient has active abduction and flexion to 120 degrees which is painful she has mild pain with Neer and Hawkins impingement test the biceps tendon is nontender to palpation there is some clunking in the shoulder with range of motion.  Review of the MRI scan shows similar findings consistent from the MRI scan of 2 years ago.  There is no full-thickness retracted rotator cuff tear.  Patient does have progressive glenohumeral degenerative changes  Imaging: No results found. No images are attached to the encounter.  Labs: Lab Results  Component Value Date   HGBA1C 6.4 (H) 10/01/2017   HGBA1C 6.7 (H) 07/29/2017   HGBA1C 6.3 (H) 04/19/2014   ESRSEDRATE 9 08/29/2007   REPTSTATUS 10/31/2020 FINAL 10/29/2020   CULT MULTIPLE SPECIES PRESENT, SUGGEST RECOLLECTION (A) 10/29/2020     Lab Results  Component Value  Date   ALBUMIN 3.8 10/29/2020   ALBUMIN 3.2 (L) 02/04/2016   ALBUMIN 2.8 (L) 04/19/2014    Lab Results  Component Value Date   MG 2.0 01/24/2017   MG 2.1 09/21/2013   MG 2.0 03/14/2007   No results found for: VD25OH  No results found for: PREALBUMIN CBC EXTENDED Latest Ref Rng & Units 10/29/2020 05/15/2019 10/01/2017  WBC 4.0 - 10.5 K/uL 4.9 5.8 5.3  RBC 3.87 - 5.11 MIL/uL 4.46 3.77(L) 4.52  HGB 12.0 - 15.0 g/dL 12.3 10.2(L) 13.4  HCT 36.0 - 46.0 % 38.4 32.2(L) 40.4  PLT 150 - 400 K/uL 290 334 294  NEUTROABS 1.7 - 7.7 K/uL - - -  LYMPHSABS 0.7 - 4.0 K/uL - - -     Body mass index is 24.24 kg/m.  Orders:  No orders of the defined types were placed in this encounter.  No orders of the defined types were placed in this encounter.    Procedures: No procedures performed  Clinical Data: No additional findings.  ROS:  All other systems negative, except as noted in the HPI. Review of Systems  Objective: Vital Signs: Ht '4\' 11"'$  (1.499 m)   Wt 120 lb (54.4 kg)   BMI 24.24 kg/m   Specialty Comments:  No specialty comments available.  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  09/26/2013   Coronary vasospasm (HCC)    Diabetes mellitus without complication  (HCC)    type II   GERD (gastroesophageal reflux disease)    Headache    Hiatal hernia    Hypercholesteremia    Hypertension    NSTEMI (non-ST elevated myocardial infarction) ([place]) not sure   NSVT (nonsustained ventricular tachycardia) ([place]) 09/26/2013   Panic attack     Family History  Problem Relation Age of Onset   Heart failure Mother    Stroke Sister    Heart attack Other     Past Surgical History:  Procedure Laterality Date   ABDOMINAL HYSTERECTOMY     PARTIAL HYSTERECTOMY   breast     right, tumor benign   BREAST EXCISIONAL BIOPSY Right    CARDIAC CATHETERIZATION  2014   CARDIAC CATHETERIZATION  2012   CARDIAC CATHETERIZATION N/A 07/22/2016   Procedure: Left Heart Cath and Coronary Angiography;  Surgeon: Belva Crome, MD;  Location: [facility];  Service: Cardiovascular;  Laterality: N/A;   cardiac stent  2012   to RCA   CHOLECYSTECTOMY N/A 10/02/2017   Procedure: LAPAROSCOPIC CHOLECYSTECTOMY;  Surgeon: Ralene Ok, MD;  Location: [facility];  Service: General;  Laterality: N/A;   CORONARY ANGIOPLASTY     ESOPHAGEAL MANOMETRY N/A 10/29/2016   Procedure: ESOPHAGEAL MANOMETRY (EM);  Surgeon: Garlan Fair, MD;  Location: [facility];  Service: Endoscopy;  Laterality: N/A;   ESOPHAGOGASTRODUODENOSCOPY (EGD) WITH PROPOFOL N/A 10/28/2016   Procedure: ESOPHAGOGASTRODUODENOSCOPY (EGD) WITH PROPOFOL;  Surgeon: Garlan Fair, MD;  Location: [facility];  Service: Endoscopy;  Laterality: N/A;   EXCISION OF SKIN TAG N/A 07/29/2017   Procedure: EXCISION OF PERIANAL  SKIN TAG;  Surgeon: Ralene Ok, MD;  Location: [facility];  Service: General;  Laterality: N/A;   KNEE SURGERY  left   left ear surgery     for bad cut   LEFT HEART CATHETERIZATION WITH CORONARY ANGIOGRAM N/A 02/22/2013   Procedure: LEFT HEART CATHETERIZATION WITH CORONARY ANGIOGRAM;  Surgeon: [name], MD;  Location: [facility];  Service: Cardiovascular;  Laterality: N/A;   SHOULDER SURGERY  Bilateral    rotator cuff   Social History   Occupational History   Not on file  Tobacco Use   Smoking status: Some Days    Packs/day: 0.50    Years: 40.00    Pack years: 20.00    Types: Cigarettes    Last attempt to quit: 01/10/2016    Years since quitting: 5.2   Smokeless tobacco: Never   Tobacco comments:    occasional   Vaping Use   Vaping Use: Never used  Substance and Sexual Activity   Alcohol use: Yes    Alcohol/week: 1.0 standard drink  Types: 1 Cans of beer per week    Comment: occasional beer   Drug use: No   Sexual activity: Never

## 2021-04-23 ENCOUNTER — Other Ambulatory Visit: Payer: Self-pay

## 2021-04-23 ENCOUNTER — Ambulatory Visit
Admission: RE | Admit: 2021-04-23 | Discharge: 2021-04-23 | Disposition: A | Discharge status: Home / Self Care | Payer: Medicare Other | Source: Ambulatory Visit | Attending: Internal Medicine | Admitting: Internal Medicine

## 2021-04-23 DIAGNOSIS — Z1231 Encounter for screening mammogram for malignant neoplasm of breast: Secondary | ICD-10-CM

## 2021-04-29 ENCOUNTER — Other Ambulatory Visit: Payer: Self-pay | Admitting: Internal Medicine

## 2021-04-29 DIAGNOSIS — R928 Other abnormal and inconclusive findings on diagnostic imaging of breast: Secondary | ICD-10-CM

## 2021-05-08 ENCOUNTER — Other Ambulatory Visit: Payer: Self-pay

## 2021-05-08 ENCOUNTER — Ambulatory Visit: Payer: Medicare Other | Admitting: Rehabilitative and Restorative Service Providers"

## 2021-05-08 ENCOUNTER — Encounter: Payer: Self-pay | Admitting: Rehabilitative and Restorative Service Providers"

## 2021-05-08 DIAGNOSIS — G8929 Other chronic pain: Secondary | ICD-10-CM

## 2021-05-08 DIAGNOSIS — M25511 Pain in right shoulder: Secondary | ICD-10-CM | POA: Diagnosis not present

## 2021-05-08 DIAGNOSIS — M25611 Stiffness of right shoulder, not elsewhere classified: Secondary | ICD-10-CM | POA: Diagnosis not present

## 2021-05-08 DIAGNOSIS — R600 Localized edema: Secondary | ICD-10-CM

## 2021-05-08 DIAGNOSIS — M6281 Muscle weakness (generalized): Secondary | ICD-10-CM

## 2021-05-08 NOTE — Therapy (Signed)
[facility] [address] [place], [place], [address] Phone: [phone]   Fax:  [phone]  Physical Therapy Evaluation  Patient Details  Name: [name] MRN: [id number] Date of Birth: 1945/09/27 Referring Provider ([name]): Newt Minion MD   Encounter Date: 05/08/2021   [name] End of Session - 05/08/21 1644     Visit Number 1    Number of Visits 12    Date for [name] Re-Evaluation 07/03/21    [name] Start Time 8185    [name] Stop Time 1430    [name] Time Calculation (min) 45 min    Activity Tolerance Patient tolerated treatment well;No increased pain    Behavior During Therapy [facility] for tasks assessed/performed             Past Medical History:  Diagnosis Date   Anemia    Anginal pain ([place])    last cp was last year   Anxiety    Arthritis    Blood transfusion    Chest pain 02/04/2016   Coronary artery disease    Coronary artery spasm, wth continued episodes of chest pain.  09/26/2013   Coronary vasospasm (HCC)    Diabetes mellitus without complication (HCC)    type II   GERD (gastroesophageal reflux disease)    Headache    Hiatal hernia    Hypercholesteremia    Hypertension    NSTEMI (non-ST elevated myocardial infarction) ([place]) not sure   NSVT (nonsustained ventricular tachycardia) ([place]) 09/26/2013   Panic attack     Past Surgical History:  Procedure Laterality Date   ABDOMINAL HYSTERECTOMY     PARTIAL HYSTERECTOMY   breast     right, tumor benign   BREAST EXCISIONAL BIOPSY Right    CARDIAC CATHETERIZATION  2014   CARDIAC CATHETERIZATION  2012   CARDIAC CATHETERIZATION N/A 07/22/2016   Procedure: Left Heart Cath and Coronary Angiography;  Surgeon: Belva Crome, MD;  Location: [facility];  Service: Cardiovascular;  Laterality: N/A;   cardiac stent  2012   to RCA   CHOLECYSTECTOMY N/A 10/02/2017   Procedure: LAPAROSCOPIC CHOLECYSTECTOMY;  Surgeon: Ralene Ok, MD;  Location: [facility];  Service: General;  Laterality: N/A;    CORONARY ANGIOPLASTY     ESOPHAGEAL MANOMETRY N/A 10/29/2016   Procedure: ESOPHAGEAL MANOMETRY (EM);  Surgeon: Garlan Fair, MD;  [facility];  Service: Endoscopy;  Laterality: N/A;   ESOPHAGOGASTRODUODENOSCOPY (EGD) WITH PROPOFOL N/A 10/28/2016   Procedure: ESOPHAGOGASTRODUODENOSCOPY (EGD) WITH PROPOFOL;  Surgeon: Garlan Fair, MD;  [facility];  Service: Endoscopy;  Laterality: N/A;   EXCISION OF SKIN TAG N/A 07/29/2017   Procedure: EXCISION OF PERIANAL  SKIN TAG;  Surgeon: Ralene Ok, MD;  Location: [facility];  Service: General;  Laterality: N/A;   KNEE SURGERY  left   left ear surgery     for bad cut   LEFT HEART CATHETERIZATION WITH CORONARY ANGIOGRAM N/A 02/22/2013   Procedure: LEFT HEART CATHETERIZATION WITH CORONARY ANGIOGRAM;  Surgeon: [name], MD;  Location: [facility] CATH LAB;  Service: Cardiovascular;  Laterality: N/A;   SHOULDER SURGERY Bilateral    rotator cuff    There were no vitals filed for this visit.    Subjective Assessment - 05/08/21 1637     Subjective [name] has a history of a previous R RTC repair (6-7 years ago).  Her R shoulder has been bothering her for several months.  She got relief with an initial cortisone injection but no relief with  subsequent injections.  She would like to have less pain, more AROM, better strength and a more functional R shoulder.    Pertinent History HTN, CAD, Angina, previous angioplasty with stent, smoker, previous L knee and R shoulder (RTC repair) surgery    Limitations House hold activities;Lifting    Patient Stated Goals Have less pain, more AROM, better strength and a more functional R shoulder.    Currently in Pain? Yes    Pain Score 4     Pain Location Shoulder    Pain Orientation Right    Pain Descriptors / Indicators Aching;Tightness    Pain Type Chronic pain    Pain Radiating Towards Notes some R UE/hand tingling    Pain Onset More than a month ago    Pain Frequency Constant     Aggravating Factors  End range and impingment positions    Pain Relieving Factors Meds    Effect of Pain on Daily Activities Limited AROM and strength.  Pain limited function and sleep.    Multiple Pain Sites No                [facility] [name] Assessment - 05/08/21 0001       Assessment   Medical Diagnosis R shoulder impingement    Referring Provider ([name]) Newt Minion MD    Onset Date/Surgical Date --   Chronic with recent increase in symptoms   Hand Dominance Right      Precautions   Precautions None      Restrictions   Weight Bearing Restrictions No    Other Position/Activity Restrictions Difficulty reaching, particularly behind the back, aches at night      Balance Screen   Has the patient fallen in the past 6 months Yes    How many times? 1    Has the patient had a decrease in activity level because of a fear of falling?  No    Is the patient reluctant to leave their home because of a fear of falling?  No      Home Ecologist residence    Living Arrangements Spouse/significant other    Available Help at Discharge Family    Additional Comments Reaching in high cabinets tough      Prior Function   Level of [place] Retired    Leisure Walking, sing, dance      Cognition   Overall Cognitive Status Within Functional Limits for tasks assessed      Observation/Other Assessments   Focus on Therapeutic Outcomes (FOTO)  37 (Goal 59 by visit 12)      ROM / Strength   AROM / PROM / Strength AROM;Strength      AROM   Overall AROM  Deficits    AROM Assessment Site Shoulder    Right/Left Shoulder Left;Right    Right Shoulder Flexion 135 Degrees    Right Shoulder Internal Rotation 35 Degrees    Right Shoulder External Rotation 70 Degrees    Right Shoulder Horizontal  ADduction 35 Degrees    Left Shoulder Flexion 150 Degrees    Left Shoulder Internal Rotation 45 Degrees    Left Shoulder External Rotation 80 Degrees     Left Shoulder Horizontal ADduction 45 Degrees      Strength   Overall Strength Deficits    Strength Assessment Site Shoulder    Right/Left Shoulder Left;Right    Right Shoulder Internal Rotation --   9.2 pounds  Right Shoulder External Rotation --   7.8 pounds   Left Shoulder Internal Rotation --   15.3 pounds   Left Shoulder External Rotation --   13.6 pounds                       Objective measurements completed on examination: See above findings.       [facility] [name] Treatment/Exercise - 05/08/21 0001       Exercises   Exercises Shoulder      Shoulder Exercises: Supine   Protraction Strengthening;Right;20 reps    Protraction Limitations 3 seconds (palm in, reach up towards ceiling then drop back down keeping elbow straight)    External Rotation AROM;Right;10 reps    External Rotation Limitations 10 seconds (supine stretch with 70 degrees abduction and elbow slightly above shoulder height)    Internal Rotation AROM;Right;10 reps    Internal Rotation Limitations 10 seconds (same set up as ER)      Shoulder Exercises: Seated   Retraction Strengthening;Both;10 reps;Limitations    Retraction Limitations 5 seconds (SBP)                     [name] Education - 05/08/21 1642     Education Details Discussed exam findings, went over imaging and shoulder anatomy with the model.  Started basic HEP.    Person(s) Educated Patient    Methods Explanation;Demonstration;Tactile cues;Verbal cues;Handout    Comprehension Verbal cues required;Returned demonstration;Need further instruction;Tactile cues required;Verbalized understanding              [name] Short Term Goals - 05/08/21 1650       [name] SHORT TERM GOAL #1   Title Improve R shoulder AROM for flexion to 170 degrees; ER to 90 degrees; IR to 60 degrees and horizontal adduction to 40 degrees.    Baseline See objective (135; 70; 35; 35 respectively)    Time 4    Period Weeks    Status New    Target  Date 06/05/21               [name] Long Term Goals - 05/08/21 1652       [name] LONG TERM GOAL #1   Title Improve FOTO to 59.    Baseline 37    Time 8    Period Weeks    Status New    Target Date 07/03/21      [name] LONG TERM GOAL #2   Title Decrease R shoulder pain to consistently 0-3/10 on the Numeric Pain Rating Scale.    Baseline Can be 5/10 and limit sleep    Time 8    Period Weeks    Status New    Target Date 07/03/21      [name] LONG TERM GOAL #3   Title Improve R shoulder strength to 100% of the uninvolved L.    Baseline See objective (currently <60%)    Time 8    Period Weeks    Status New    Target Date 07/03/21      [name] LONG TERM GOAL #4   Title [name] will be independent with her long-term maintenence  HEP at DC.    Baseline Prescribed today    Time 8    Period Weeks    Status New    Target Date 07/03/21                    Plan - 05/08/21 1646  Clinical Impression Statement [name] has chronic R shoulder pain and a history including a previous R RTC repair 6-7 years ago.  She is tight globally in her R shoulder capsule and has global R shoulder weakness.  Addressing these areas will reduce impingement and allow [name] to resume normal functional activities with much less pain.    Personal Factors and Comorbidities Comorbidity 3+    Comorbidities HTN, CAD, Angina, previous angioplasty with stent, smoker, previous L knee and R shoulder (RTC repair) surgery    Examination-Activity Limitations Dressing;Sleep;Bed Mobility;Lift;Carry;Reach Overhead    Examination-Participation Restrictions Community Activity    Stability/Clinical Decision Making Stable/Uncomplicated    Clinical Decision Making Low    Rehab Potential Good    [name] Frequency --   1-2X/week   [name] Duration 8 weeks    [name] Treatment/Interventions ADLs/Self Care Home Management;Moist Heat;Cryotherapy;Therapeutic activities;Therapeutic exercise;Neuromuscular re-education;Patient/family education;Manual  techniques;Passive range of motion    [name] Next Visit Plan Add a posterior capsule stretch to current capsular stretches, progress scapular and RTC strength.  Work on overhead stretching when appropriate.    [name] Home Exercise Plan Access Code: [place]  URL: [email or web address]  Date: 05/08/2021  Prepared by: Vista Mink    Exercises  Standing Scapular Retraction - 5 x daily - 7 x weekly - 1 sets - 5 reps - 5 second hold  Supine Scapular Protraction in Flexion with Dumbbells - 2 x daily - 7 x weekly - 1 sets - 20 reps - 3 seconds hold  Supine Shoulder Flexion AAROM with Hands Clasped - 2 x daily - 7 x weekly - 1 sets - 20 reps - 10 seconds hold  Standing Bicep Curl with Dumbbells - 3-5 x daily - 7 x weekly - 1 sets - 10 reps  Shoulder External Rotation with Anchored Resistance with Towel Under Elbow - 1 x daily - 7 x weekly - 1-2 sets - 10 reps - 3 hold  Supine Shoulder Internal Rotation Stretch - 2 x daily - 7 x weekly - 1 sets - 10-20 reps - 10 seconds hold  Supine Shoulder External Rotation Stretch - 2 x daily - 7 x weekly - 1 sets - 10-20 reps - 10 seconds hold    Consulted and Agree with Plan of Care Patient             Patient will benefit from skilled therapeutic intervention in order to improve the following deficits and impairments:  Decreased range of motion, Decreased strength, Increased edema, Impaired flexibility, Impaired UE functional use, Pain  Visit Diagnosis: Muscle weakness (generalized)  Stiffness of right shoulder, not elsewhere classified  Chronic right shoulder pain  Localized edema     Problem List Patient Active Problem List   Diagnosis Date Noted   Variant angina ([place]) 01/24/2017   Chest pain 01/23/2017   Headache 01/23/2017   S/P angioplasty with stent 01/23/2017   Coronary artery disease 11/24/2014   Syncope 10/22/2014   Hyperglycemia 04/20/2014   H/O hiatal hernia 04/19/2014   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 [place], [place], [address] Phone: [phone]   Fax:  [phone]  Name: [name] MRN: [id number] Date of Birth: October 15, 1945

## 2021-05-08 NOTE — Patient Instructions (Signed)
Access Code: [email or web address] URL: [email or web address] Date: 05/08/2021 Prepared by: Vista Mink  Exercises Supine Shoulder Internal Rotation Stretch - 2-3 x daily - 7 x weekly - 1 sets - 10-20 reps - 10 seconds hold Supine Shoulder External Rotation Stretch - 2-3 x daily - 7 x weekly - 1 sets - 10-20 reps - 10 seconds hold Standing Scapular Retraction - 5 x daily - 7 x weekly - 1 sets - 5 reps - 5 second hold Supine Scapular Protraction in Flexion with Dumbbells - 2-3 x daily - 7 x weekly - 1 sets - 20 reps - 3 seconds hold

## 2021-05-15 ENCOUNTER — Ambulatory Visit
Admission: RE | Admit: 2021-05-15 | Discharge: 2021-05-15 | Disposition: A | Discharge status: Home / Self Care | Payer: Medicare Other | Source: Ambulatory Visit | Attending: Internal Medicine | Admitting: Internal Medicine

## 2021-05-15 ENCOUNTER — Other Ambulatory Visit: Payer: Self-pay

## 2021-05-15 ENCOUNTER — Ambulatory Visit: Payer: Medicare Other

## 2021-05-15 DIAGNOSIS — R928 Other abnormal and inconclusive findings on diagnostic imaging of breast: Secondary | ICD-10-CM

## 2021-05-15 DIAGNOSIS — R922 Inconclusive mammogram: Secondary | ICD-10-CM | POA: Diagnosis not present

## 2021-05-17 ENCOUNTER — Other Ambulatory Visit: Payer: Self-pay

## 2021-05-17 ENCOUNTER — Encounter: Payer: Self-pay | Admitting: Rehabilitative and Restorative Service Providers"

## 2021-05-17 ENCOUNTER — Ambulatory Visit: Payer: Medicare Other | Admitting: Rehabilitative and Restorative Service Providers"

## 2021-05-17 DIAGNOSIS — M6281 Muscle weakness (generalized): Secondary | ICD-10-CM | POA: Diagnosis not present

## 2021-05-17 DIAGNOSIS — R600 Localized edema: Secondary | ICD-10-CM

## 2021-05-17 DIAGNOSIS — G8929 Other chronic pain: Secondary | ICD-10-CM

## 2021-05-17 DIAGNOSIS — M25511 Pain in right shoulder: Secondary | ICD-10-CM | POA: Diagnosis not present

## 2021-05-17 DIAGNOSIS — M25611 Stiffness of right shoulder, not elsewhere classified: Secondary | ICD-10-CM

## 2021-05-17 NOTE — Patient Instructions (Signed)
Access Code: [place] URL: [email or web address] Date: 05/17/2021 Prepared by: Vista Mink  Exercises Standing Scapular Retraction - 5 x daily - 7 x weekly - 1 sets - 5 reps - 5 second hold Supine Scapular Protraction in Flexion with Dumbbells - 2 x daily - 7 x weekly - 1 sets - 20 reps - 3 seconds hold Shoulder External Rotation with Anchored Resistance with Towel Under Elbow - 1-2 x daily - 7 x weekly - 2-3 sets - 10 reps - 3 hold Supine Shoulder Internal Rotation Stretch - 2-3 x daily - 7 x weekly - 1 sets - 10-20 reps - 10 seconds hold Supine Shoulder External Rotation Stretch - 2-3 x daily - 7 x weekly - 1 sets - 10-20 reps - 10 seconds hold Shoulder Internal Rotation with Resistance - 1 x daily - 7 x weekly - 1-2 sets - 10 reps - 3 seconds hold

## 2021-05-17 NOTE — Therapy (Signed)
[facility] Physical Therapy [address] [place], [place], [address] Phone: [phone]   Fax:  [phone]  Physical Therapy Treatment  Patient Details  Name: [name] MRN: [id number] Date of Birth: [birth year] Referring Provider (PT): Newt Minion MD   Encounter Date: 05/17/2021   PT End of Session - 05/17/21 1152     Visit Number 2    Number of Visits 12    Date for PT Re-Evaluation 07/03/21    PT Start Time 9528    PT Stop Time 1140    PT Time Calculation (min) 42 min    Activity Tolerance Patient tolerated treatment well;No increased pain    Behavior During Therapy [facility] for tasks assessed/performed             Past Medical History:  Diagnosis Date   Anemia    Anginal pain ([place])    last cp was last year   Anxiety    Arthritis    Blood transfusion    Chest pain 02/04/2016   Coronary artery disease    Coronary artery spasm, wth continued episodes of chest pain.  09/26/2013   Coronary vasospasm (HCC)    Diabetes mellitus without complication (HCC)    type II   GERD (gastroesophageal reflux disease)    Headache    Hiatal hernia    Hypercholesteremia    Hypertension    NSTEMI (non-ST elevated myocardial infarction) ([place]) not sure   NSVT (nonsustained ventricular tachycardia) 09/26/2013   Panic attack     Past Surgical History:  Procedure Laterality Date   ABDOMINAL HYSTERECTOMY     PARTIAL HYSTERECTOMY   breast     right, tumor benign   BREAST EXCISIONAL BIOPSY Right    1961 (age 89) benign   CARDIAC CATHETERIZATION  2014   CARDIAC CATHETERIZATION  2012   CARDIAC CATHETERIZATION N/A 07/22/2016   Procedure: Left Heart Cath and Coronary Angiography;  Surgeon: Belva Crome, MD;  Location: [place] [facility];  Service: Cardiovascular;  Laterality: N/A;   cardiac stent  2012   to RCA   CHOLECYSTECTOMY N/A 10/02/2017   Procedure: LAPAROSCOPIC CHOLECYSTECTOMY;  Surgeon: Ralene Ok, MD;  Location: [place];  Service: General;   Laterality: N/A;   CORONARY ANGIOPLASTY     ESOPHAGEAL MANOMETRY N/A 10/29/2016   Procedure: ESOPHAGEAL MANOMETRY (EM);  Surgeon: Garlan Fair, MD;  [facility]: [facility];  Service: Endoscopy;  Laterality: N/A;   ESOPHAGOGASTRODUODENOSCOPY (EGD) WITH PROPOFOL N/A 10/28/2016   Procedure: ESOPHAGOGASTRODUODENOSCOPY (EGD) WITH PROPOFOL;  Surgeon: Garlan Fair, MD;  [facility]: [facility];  Service: Endoscopy;  Laterality: N/A;   EXCISION OF SKIN TAG N/A 07/29/2017   Procedure: EXCISION OF PERIANAL  SKIN TAG;  Surgeon: Ralene Ok, MD;  Location: [place];  Service: General;  Laterality: N/A;   KNEE SURGERY  left   left ear surgery     for bad cut   LEFT HEART CATHETERIZATION WITH CORONARY ANGIOGRAM N/A 02/22/2013   Procedure: LEFT HEART CATHETERIZATION WITH CORONARY ANGIOGRAM;  Surgeon: [name], MD;  Location: [facility] C[facility];  Service: Cardiovascular;  Laterality: N/A;   SHOULDER SURGERY Bilateral    rotator cuff    There were no vitals filed for this visit.   Subjective Assessment - 05/17/21 1149     Subjective [place]e report consistent 2X/day HEP compliance since evaluation.  Pain, particularly at night is functionally limiting.    Pertinent History HTN, CAD, Angina, previous angioplasty with stent, smoker, previous L knee and  R shoulder (RTC repair) surgery    Limitations House hold activities;Lifting    Patient Stated Goals Have less pain, more AROM, better strength and a more functional R shoulder.    Currently in Pain? Yes    Pain Score 5     Pain Location Shoulder    Pain Orientation Right    Pain Descriptors / Indicators Aching;Tightness    Pain Type Chronic pain    Pain Radiating Towards Occasional R UE/hand tingling    Pain Onset More than a month ago    Pain Frequency Constant    Aggravating Factors  End range AROM, reaching, limited overhead and behind the back function    Pain Relieving Factors Pain medication, ice, heat    Effect of Pain on Daily  Activities Poor sleep and all R UE function is limited    Multiple Pain Sites No                [facility] PT Assessment - 05/17/21 0001       AROM   Right Shoulder Internal Rotation 40 Degrees    Right Shoulder External Rotation 80 Degrees                           [facility] Adult PT Treatment/Exercise - 05/17/21 0001       Exercises   Exercises Shoulder      Shoulder Exercises: Supine   Protraction Strengthening;Right;20 reps    Protraction Weight (lbs) 2    Protraction Limitations 3 seconds (palm in, reach up towards ceiling then drop back down keeping elbow straight)    External Rotation AROM;Right;20 reps    External Rotation Limitations 10 seconds (supine stretch with 70 degrees abduction and elbow slightly above shoulder height)    Internal Rotation AROM;Right;20 reps    Internal Rotation Limitations 10 seconds (same set up as ER)      Shoulder Exercises: Seated   Retraction Strengthening;Both;10 reps;Limitations    Retraction Limitations 5 seconds (SBP)      Shoulder Exercises: Standing   External Rotation Strengthening;Right;10 reps;Theraband;Limitations    Theraband Level (Shoulder External Rotation) Level 2 (Red)    External Rotation Limitations 2 sets 3 second hold slow eccentrics towel under elbow    Internal Rotation Strengthening;Right;10 reps;Theraband    Theraband Level (Shoulder Internal Rotation) Level 2 (Red)    Internal Rotation Limitations 3 seconds slow eccentrics towel under elbow      Modalities   Modalities Cryotherapy      Cryotherapy   Number Minutes Cryotherapy 10 Minutes    Cryotherapy Location Shoulder    Type of Cryotherapy Ice pack                     PT Education - 05/17/21 1151     Education Details Reviewed day 1 HEP with corrections needed and provided.  Progressed scapular and RTC strength.    Person(s) Educated Patient    Methods Explanation;Demonstration;Tactile cues;Verbal cues;Handout     Comprehension Verbalized understanding;Tactile cues required;Returned demonstration;Need further instruction;Verbal cues required              PT Short Term Goals - 05/08/21 1650       PT SHORT TERM GOAL #1   Title Improve R shoulder AROM for flexion to 170 degrees; ER to 90 degrees; IR to 60 degrees and horizontal adduction to 40 degrees.    Baseline See objective (135; 70; 35; 35 respectively)  Time 4    Period Weeks    Status New    Target Date 06/05/21               PT Long Term Goals - 05/08/21 1652       PT LONG TERM GOAL #1   Title Improve FOTO to 59.    Baseline 37    Time 8    Period Weeks    Status New    Target Date 07/03/21      PT LONG TERM GOAL #2   Title Decrease R shoulder pain to consistently 0-3/10 on the Numeric Pain Rating Scale.    Baseline Can be 5/10 and limit sleep    Time 8    Period Weeks    Status New    Target Date 07/03/21      PT LONG TERM GOAL #3   Title Improve R shoulder strength to 100% of the uninvolved L.    Baseline See objective (currently <60%)    Time 8    Period Weeks    Status New    Target Date 07/03/21      PT LONG TERM GOAL #4   Title [name] will be independent with her long-term maintenence  HEP at [place].    Baseline Prescribed today    Time 8    Period Weeks    Status New    Target Date 07/03/21                   Plan - 05/17/21 1152     Clinical Impression Statement [name] is making early objective AROM progress with her supervised PT.  This is consistent with her reported 2X/day HEP compliance.  Although improved, AROM is still impaired and will benefit from the recommended plan of care (as will strength, pain and R shoulder function).    Personal Factors and Comorbidities Comorbidity 3+    Comorbidities HTN, CAD, Angina, previous angioplasty with stent, smoker, previous L knee and R shoulder (RTC repair) surgery    Examination-Activity Limitations Dressing;Sleep;Bed Mobility;Lift;Carry;Reach  Overhead    Examination-Participation Restrictions Community Activity    Stability/Clinical Decision Making Stable/Uncomplicated    Rehab Potential Good    PT Frequency --   1-2X/week   PT Duration 8 weeks    PT Treatment/Interventions ADLs/Self Care Home Management;Moist Heat;Cryotherapy;Therapeutic activities;Therapeutic exercise;Neuromuscular re-education;Patient/family education;Manual techniques;Passive range of motion    PT Next Visit Plan Posterior capsule stretch, strength progressions PRN    PT Home Exercise Plan Access Code: [place] and Agree with Plan of Care Patient             Patient will benefit from skilled therapeutic intervention in order to improve the following deficits and impairments:  Decreased range of motion, Decreased strength, Increased edema, Impaired flexibility, Impaired UE functional use, Pain  Visit Diagnosis: Muscle weakness (generalized)  Stiffness of right shoulder, not elsewhere classified  Chronic right shoulder pain  Localized edema     Problem List Patient Active Problem List   Diagnosis Date Noted   Variant angina ([place]) 01/24/2017   Chest pain 01/23/2017   Headache 01/23/2017   S/P angioplasty with stent 01/23/2017   Coronary artery disease 11/24/2014   Syncope 10/22/2014   Hyperglycemia 04/20/2014   H/O hiatal hernia 04/19/2014   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

## 2021-05-20 ENCOUNTER — Encounter: Payer: Self-pay | Admitting: Orthopedic Surgery

## 2021-05-20 ENCOUNTER — Ambulatory Visit: Payer: Medicare Other | Admitting: Orthopedic Surgery

## 2021-05-20 ENCOUNTER — Other Ambulatory Visit: Payer: Self-pay

## 2021-05-20 DIAGNOSIS — M7541 Impingement syndrome of right shoulder: Secondary | ICD-10-CM

## 2021-05-20 MED ORDER — OXYCODONE-ACETAMINOPHEN 5-325 MG PO TABS: 1.0000 | ORAL_TABLET | Freq: Three times a day (TID) | ORAL | 0 refills | Status: DC | PRN

## 2021-05-20 NOTE — Progress Notes (Signed)
Office Visit Note   Patient: [name]           Date of Birth: Dec 04, 1945           MRN: [id number] Visit Date: 05/20/2021              Requested by: [name], MD [facility] [employer] [place] [address] [place],  [place] [address] PCP: [name], MD  Chief Complaint  Patient presents with   Right Shoulder - Pain      HPI: Patient is a 75 year old woman who presents in follow-up for right shoulder pain.  She is undergoing physical therapy she feels like she is making improvement but still has pain.  She states she is no longer on prednisone.  Assessment & Plan: Visit Diagnoses: No diagnosis found.  Plan: A prescription was called in for Percocet for breakthrough pain she will continue with her physical therapy follow-up in 4 weeks.  Follow-Up Instructions: No follow-ups on file.   Ortho Exam  Patient is alert, oriented, no adenopathy, well-dressed, normal affect, normal respiratory effort. Examination patient has abduction and flexion to 90 degrees internal and external rotation of 45 degrees there is no crepitation with range of motion.  Imaging: No results found. No images are attached to the encounter.  Labs: Lab Results  Component Value Date   HGBA1C 6.4 (H) 10/01/2017   HGBA1C 6.7 (H) 07/29/2017   HGBA1C 6.3 (H) 04/19/2014   ESRSEDRATE 9 08/29/2007   REPTSTATUS 10/31/2020 FINAL 10/29/2020   CULT MULTIPLE SPECIES PRESENT, SUGGEST RECOLLECTION (A) 10/29/2020     Lab Results  Component Value Date   ALBUMIN 3.8 10/29/2020   ALBUMIN 3.2 (L) 02/04/2016   ALBUMIN 2.8 (L) 04/19/2014    Lab Results  Component Value Date   MG 2.0 01/24/2017   MG 2.1 09/21/2013   MG 2.0 03/14/2007   No results found for: VD25OH  No results found for: PREALBUMIN CBC EXTENDED Latest Ref Rng & Units 10/29/2020 05/15/2019 10/01/2017  WBC 4.0 - 10.5 K/uL 4.9 5.8 5.3  RBC 3.87 - 5.11 MIL/uL 4.46 3.77(L) 4.52  HGB 12.0 - 15.0 g/dL 12.3 10.2(L) 13.4  HCT 36.0 - 46.0 % 38.4  32.2(L) 40.4  PLT 150 - 400 K/uL 290 334 294  NEUTROABS 1.7 - 7.7 K/uL - - -  LYMPHSABS 0.7 - 4.0 K/uL - - -     There is no height or weight on file to calculate BMI.  Orders:  No orders of the defined types were placed in this encounter.  Meds ordered this encounter  Medications   oxyCODONE-acetaminophen (PERCOCET/ROXICET) 5-325 MG tablet    Sig: Take 1 tablet by mouth every 8 (eight) hours as needed for severe pain.    Dispense:  15 tablet    Refill:  0     Procedures: No procedures performed  Clinical Data: No additional findings.  ROS:  All other systems negative, except as noted in the HPI. Review of Systems  Objective: Vital Signs: There were no vitals taken for this visit.  Specialty Comments:  No specialty comments available.  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  09/26/2013   Coronary vasospasm (HCC)    Diabetes mellitus without complication (HCC)    type II   GERD (gastroesophageal reflux disease)    Headache    Hiatal hernia    Hypercholesteremia    Hypertension    NSTEMI (non-ST elevated myocardial infarction) ([place]) not sure   NSVT (nonsustained ventricular tachycardia) 09/26/2013   Panic attack     Family History  Problem  Relation Age of Onset   Heart failure Mother    Stroke Sister    Heart attack Other     Past Surgical History:  Procedure Laterality Date   ABDOMINAL HYSTERECTOMY     PARTIAL HYSTERECTOMY   breast     right, tumor benign   BREAST EXCISIONAL BIOPSY Right    33 (age 55) benign   CARDIAC CATHETERIZATION  2014   CARDIAC CATHETERIZATION  2012   CARDIAC CATHETERIZATION N/A 07/22/2016   Procedure: Left Heart Cath and Coronary Angiography;  Surgeon: Belva Crome, MD;  Location: [place] [facility];  Service: Cardiovascular;  Laterality: N/A;   cardiac stent  2012   to RCA   CHOLECYSTECTOMY N/A 10/02/2017   Procedure: LAPAROSCOPIC CHOLECYSTECTOMY;  Surgeon: Ralene Ok, MD;  Location: [place];  Service: General;  Laterality: N/A;   CORONARY ANGIOPLASTY     ESOPHAGEAL MANOMETRY N/A 10/29/2016   Procedure: ESOPHAGEAL MANOMETRY (EM);  Surgeon: Garlan Fair, MD;  [facility];  Service: Endoscopy;  Laterality: N/A;   ESOPHAGOGASTRODUODENOSCOPY (EGD) WITH PROPOFOL N/A 10/28/2016   Procedure: ESOPHAGOGASTRODUODENOSCOPY (EGD) WITH PROPOFOL;  Surgeon: Garlan Fair, MD;  [facility]: [facility];  Service: Endoscopy;  Laterality: N/A;   EXCISION OF SKIN TAG N/A 07/29/2017   Procedure: EXCISION OF PERIANAL  SKIN TAG;  Surgeon: Ralene Ok, MD;  Location: [place];  Service: General;  Laterality: N/A;   KNEE SURGERY  left   left ear surgery     for bad cut   LEFT HEART CATHETERIZATION WITH CORONARY ANGIOGRAM N/A 02/22/2013   Procedure: LEFT HEART CATHETERIZATION WITH CORONARY ANGIOGRAM;  Surgeon: [name], MD;  Location: [facility] C[facility] LAB;  Service: Cardiovascular;  Laterality: N/A;   SHOULDER SURGERY Bilateral    rotator cuff   Social History   Occupational History   Not on file  Tobacco Use   Smoking status: Some Days    Packs/day: 0.50    Years: 40.00    Pack years: 20.00    Types: Cigarettes    Last attempt to quit: 01/10/2016    Years since quitting: 5.3    Smokeless tobacco: Never   Tobacco comments:    occasional   Vaping Use   Vaping Use: Never used  Substance and Sexual Activity   Alcohol use: Yes    Alcohol/week: 1.0 standard drink    Types: 1 Cans of beer per week    Comment: occasional beer   Drug use: No   Sexual activity: Never

## 2021-05-23 ENCOUNTER — Encounter: Payer: Medicare Other | Admitting: Rehabilitative and Restorative Service Providers"

## 2021-05-24 ENCOUNTER — Other Ambulatory Visit: Payer: Self-pay

## 2021-05-24 ENCOUNTER — Encounter: Payer: Self-pay | Admitting: Rehabilitative and Restorative Service Providers"

## 2021-05-24 ENCOUNTER — Ambulatory Visit (INDEPENDENT_AMBULATORY_CARE_PROVIDER_SITE_OTHER): Payer: Medicare Other | Admitting: Rehabilitative and Restorative Service Providers"

## 2021-05-24 DIAGNOSIS — M25611 Stiffness of right shoulder, not elsewhere classified: Secondary | ICD-10-CM

## 2021-05-24 DIAGNOSIS — R600 Localized edema: Secondary | ICD-10-CM | POA: Diagnosis not present

## 2021-05-24 DIAGNOSIS — M25511 Pain in right shoulder: Secondary | ICD-10-CM

## 2021-05-24 DIAGNOSIS — M6281 Muscle weakness (generalized): Secondary | ICD-10-CM

## 2021-05-24 DIAGNOSIS — G8929 Other chronic pain: Secondary | ICD-10-CM

## 2021-05-24 NOTE — Therapy (Signed)
[facility] Physical Therapy [address] [place], [place], [address] Phone: [phone]   Fax:  [phone]  Physical Therapy Treatment  Patient Details  Name: [name] MRN: [id number] Date of Birth: Jul 03, 1946 Referring Provider (PT): Newt Minion MD   Encounter Date: 05/24/2021   PT End of Session - 05/24/21 1708     Visit Number 3    Number of Visits 12    Date for PT Re-Evaluation 07/03/21    PT Start Time 1301    PT Stop Time 1345    PT Time Calculation (min) 44 min    Activity Tolerance Patient tolerated treatment well;No increased pain    Behavior During Therapy [facility] for tasks assessed/performed             Past Medical History:  Diagnosis Date   Anemia    Anginal pain ([place])    last cp was last year   Anxiety    Arthritis    Blood transfusion    Chest pain 02/04/2016   Coronary artery disease    Coronary artery spasm, wth continued episodes of chest pain.  09/26/2013   Coronary vasospasm (HCC)    Diabetes mellitus without complication (HCC)    type II   GERD (gastroesophageal reflux disease)    Headache    Hiatal hernia    Hypercholesteremia    Hypertension    NSTEMI (non-ST elevated myocardial infarction) ([place]) not sure   NSVT (nonsustained ventricular tachycardia) 09/26/2013   Panic attack     Past Surgical History:  Procedure Laterality Date   ABDOMINAL HYSTERECTOMY     PARTIAL HYSTERECTOMY   breast     right, tumor benign   BREAST EXCISIONAL BIOPSY Right    1961 (age 75) benign   CARDIAC CATHETERIZATION  2014   CARDIAC CATHETERIZATION  2012   CARDIAC CATHETERIZATION N/A 07/22/2016   Procedure: Left Heart Cath and Coronary Angiography;  Surgeon: Belva Crome, MD;  Location: [place] [facility];  Service: Cardiovascular;  Laterality: N/A;   cardiac stent  2012   to RCA   CHOLECYSTECTOMY N/A 10/02/2017   Procedure: LAPAROSCOPIC CHOLECYSTECTOMY;  Surgeon: Ralene Ok, MD;  Location: [place];  Service: General;   Laterality: N/A;   CORONARY ANGIOPLASTY     ESOPHAGEAL MANOMETRY N/A 10/29/2016   Procedure: ESOPHAGEAL MANOMETRY (EM);  Surgeon: Garlan Fair, MD;  [facility];  Service: Endoscopy;  Laterality: N/A;   ESOPHAGOGASTRODUODENOSCOPY (EGD) WITH PROPOFOL N/A 10/28/2016   Procedure: ESOPHAGOGASTRODUODENOSCOPY (EGD) WITH PROPOFOL;  Surgeon: Garlan Fair, MD;  [facility]: [facility];  Service: Endoscopy;  Laterality: N/A;   EXCISION OF SKIN TAG N/A 07/29/2017   Procedure: EXCISION OF PERIANAL  SKIN TAG;  Surgeon: Ralene Ok, MD;  Location: [place];  Service: General;  Laterality: N/A;   KNEE SURGERY  left   left ear surgery     for bad cut   LEFT HEART CATHETERIZATION WITH CORONARY ANGIOGRAM N/A 02/22/2013   Procedure: LEFT HEART CATHETERIZATION WITH CORONARY ANGIOGRAM;  Surgeon: [name], MD;  Location: [facility] CATH LAB;  Service: Cardiovascular;  Laterality: N/A;   SHOULDER SURGERY Bilateral    rotator cuff    There were no vitals filed for this visit.   Subjective Assessment - 05/24/21 1311     Subjective Veroncia notes improved R shoulder AROM.  Sleep is still challenging due to R shoulder pain.    Pertinent History HTN, CAD, Angina, previous angioplasty with stent, smoker, previous L knee and  R shoulder (RTC repair) surgery    Limitations House hold activities;Lifting    Patient Stated Goals Have less pain, more AROM, better strength and a more functional R shoulder.    Currently in Pain? Yes    Pain Score 6     Pain Location Shoulder    Pain Orientation Right    Pain Descriptors / Indicators Aching;Tightness    Pain Type Chronic pain    Pain Radiating Towards Occasional R UE/hand tingling    Pain Onset More than a month ago    Pain Frequency Constant    Aggravating Factors  End range AROM, reaching, limited overhead and behind the back function    Pain Relieving Factors Oxycodone, ice and exercises    Effect of Pain on Daily Activities Poor sleep and all R UE  function is limited    Multiple Pain Sites No                [facility] PT Assessment - 05/24/21 0001       ROM / Strength   AROM / PROM / Strength AROM      AROM   Overall AROM  Deficits    AROM Assessment Site Shoulder    Right/Left Shoulder Left;Right    Right Shoulder Flexion 150 Degrees    Right Shoulder Internal Rotation 50 Degrees    Right Shoulder External Rotation 80 Degrees    Right Shoulder Horizontal  ADduction 40 Degrees    Left Shoulder Flexion 155 Degrees    Left Shoulder Internal Rotation 50 Degrees    Left Shoulder External Rotation 90 Degrees    Left Shoulder Horizontal ADduction 50 Degrees                           [facility] Adult PT Treatment/Exercise - 05/24/21 0001       Exercises   Exercises Shoulder      Shoulder Exercises: Supine   Protraction Strengthening;Right;20 reps    Protraction Weight (lbs) 3    Protraction Limitations 3 seconds (palm in, reach up towards ceiling then drop back down keeping elbow straight)    External Rotation AROM;Right;20 reps    External Rotation Limitations 10 seconds (supine stretch with 70 degrees abduction and elbow slightly above shoulder height)    Internal Rotation AROM;Right;20 reps    Internal Rotation Limitations 10 seconds (same set up as ER)    Flexion AROM;Right;10 reps;Limitations    Flexion Limitations 10 seconds      Shoulder Exercises: Seated   Retraction Strengthening;Both;10 reps;Limitations    Retraction Limitations 5 seconds (SBP)      Shoulder Exercises: Standing   External Rotation Strengthening;Right;10 reps;Theraband;Limitations    Theraband Level (Shoulder External Rotation) Level 2 (Red)    External Rotation Limitations 2 sets 3 second hold slow eccentrics towel under elbow    Internal Rotation Strengthening;Right;10 reps;Theraband    Theraband Level (Shoulder Internal Rotation) Level 2 (Red)    Internal Rotation Limitations 3 seconds slow eccentrics towel under elbow     Other Standing Exercises Posterior capsule stretch 10X 10 seconds    Other Standing Exercises Thumb up the back stretch 10X 10 seconds      Modalities   Modalities --      Cryotherapy   Number Minutes Cryotherapy --    Cryotherapy Location --    Type of Cryotherapy --  PT Education - 05/24/21 1706     Education Details Reviewed and corrected HEP.  Added additional AROM and capsular flexibility exercises.    Person(s) Educated Patient    Methods Explanation;Demonstration;Tactile cues;Verbal cues;Handout    Comprehension Verbalized understanding;Tactile cues required;Need further instruction;Returned demonstration;Verbal cues required              PT Short Term Goals - 05/24/21 1707       PT SHORT TERM GOAL #1   Title Improve R shoulder AROM for flexion to 170 degrees; ER to 90 degrees; IR to 60 degrees and horizontal adduction to 40 degrees.    Baseline 150; 80; 50; 40 respectively  (was 135; 70; 35; 35 respectively)    Time 4    Period Weeks    Status Partially Met    Target Date 06/05/21               PT Long Term Goals - 05/08/21 1652       PT LONG TERM GOAL #1   Title Improve FOTO to 59.    Baseline 37    Time 8    Period Weeks    Status New    Target Date 07/03/21      PT LONG TERM GOAL #2   Title Decrease R shoulder pain to consistently 0-3/10 on the Numeric Pain Rating Scale.    Baseline Can be 5/10 and limit sleep    Time 8    Period Weeks    Status New    Target Date 07/03/21      PT LONG TERM GOAL #3   Title Improve R shoulder strength to 100% of the uninvolved L.    Baseline See objective (currently <60%)    Time 8    Period Weeks    Status New    Target Date 07/03/21      PT LONG TERM GOAL #4   Title Gwendola will be independent with her long-term maintenence  HEP at DC.    Baseline Prescribed today    Time 8    Period Weeks    Status New    Target Date 07/03/21                   Plan -  05/24/21 1708     Clinical Impression Statement Ryian continues to make objective progress with her AROM.  AROM still needs work while strength will become more of an emhasis as well.  Continue POC with possible RA in the next week or 2.    Personal Factors and Comorbidities Comorbidity 3+    Comorbidities HTN, CAD, Angina, previous angioplasty with stent, smoker, previous L knee and R shoulder (RTC repair) surgery    Examination-Activity Limitations Dressing;Sleep;Bed Mobility;Lift;Carry;Reach Overhead    Examination-Participation Restrictions Community Activity    Stability/Clinical Decision Making Stable/Uncomplicated    Rehab Potential Good    PT Frequency Other (comment)   1-2X/week   PT Duration 8 weeks    PT Treatment/Interventions ADLs/Self Care Home Management;Moist Heat;Cryotherapy;Therapeutic activities;Therapeutic exercise;Neuromuscular re-education;Patient/family education;Manual techniques;Passive range of motion    PT Next Visit Plan Continue AROM and capsular flexibility with strength progressions PRN    PT Home Exercise Plan Access Code: [place] and Agree with Plan of Care Patient             Patient will benefit from skilled therapeutic intervention in order to improve the following deficits and impairments:  Decreased range of motion, Decreased strength,  Increased edema, Impaired flexibility, Impaired UE functional use, Pain  Visit Diagnosis: Muscle weakness (generalized)  Stiffness of right shoulder, not elsewhere classified  Chronic right shoulder pain  Localized edema     Problem List Patient Active Problem List   Diagnosis Date Noted   Variant angina ([place]) 01/24/2017   Chest pain 01/23/2017   Headache 01/23/2017   S/P angioplasty with stent 01/23/2017   Coronary artery disease 11/24/2014   Syncope 10/22/2014   Hyperglycemia 04/20/2014   H/O hiatal hernia 04/19/2014   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 Physical Therapy [address] [place], [place], [address] Phone: [phone]   Fax:  [phone]  Name: Sherrian Nunnelley MRN: [id number] Date of Birth: [birth year]

## 2021-05-24 NOTE — Patient Instructions (Signed)
Access Code: [email or web address] URL: [email or web address] Date: 05/24/2021 Prepared by: Vista Mink  Exercises Standing Scapular Retraction - 5 x daily - 7 x weekly - 1 sets - 5 reps - 5 second hold Supine Scapular Protraction in Flexion with Dumbbells - 2 x daily - 7 x weekly - 1 sets - 20 reps - 3 seconds hold Shoulder External Rotation with Anchored Resistance with Towel Under Elbow - 2 x daily - 7 x weekly - 2-3 sets - 10 reps - 3 hold Supine Shoulder External Rotation Stretch - 2 x daily - 7 x weekly - 1 sets - 10 reps - 10 seconds hold Shoulder Internal Rotation with Resistance - 2 x daily - 7 x weekly - 1 sets - 10 reps - 3 seconds hold Standing Shoulder Internal Rotation Stretch with Hands Behind Back - 2 x daily - 7 x weekly - 1 sets - 10 reps - 10 seconds hold Standing Shoulder Posterior Capsule Stretch - 2 x daily - 7 x weekly - 1 sets - 5 reps - 10 hold Supine Shoulder Flexion AAROM with Hands Clasped - 2 x daily - 7 x weekly - 1 sets - 10-20 reps - 10 seconds hold

## 2021-05-29 ENCOUNTER — Encounter: Payer: Self-pay | Admitting: Rehabilitative and Restorative Service Providers"

## 2021-05-29 ENCOUNTER — Other Ambulatory Visit: Payer: Self-pay

## 2021-05-29 ENCOUNTER — Ambulatory Visit (INDEPENDENT_AMBULATORY_CARE_PROVIDER_SITE_OTHER): Payer: Medicare Other | Admitting: Rehabilitative and Restorative Service Providers"

## 2021-05-29 DIAGNOSIS — M25511 Pain in right shoulder: Secondary | ICD-10-CM

## 2021-05-29 DIAGNOSIS — M25611 Stiffness of right shoulder, not elsewhere classified: Secondary | ICD-10-CM | POA: Diagnosis not present

## 2021-05-29 DIAGNOSIS — R600 Localized edema: Secondary | ICD-10-CM | POA: Diagnosis not present

## 2021-05-29 DIAGNOSIS — M6281 Muscle weakness (generalized): Secondary | ICD-10-CM

## 2021-05-29 DIAGNOSIS — G8929 Other chronic pain: Secondary | ICD-10-CM | POA: Diagnosis not present

## 2021-05-29 NOTE — Patient Instructions (Signed)
Continue current HEP 

## 2021-05-29 NOTE — Therapy (Signed)
[facility] Physical Therapy [address] [place], [place], [address] Phone: [phone]   Fax:  [phone]  Physical Therapy Treatment/Progress Note  Patient Details  Name: [name] MRN: [id number] Date of Birth: November 15, 1945 Referring Provider (PT): Newt Minion MD  Progress Note Reporting Period 05/08/2021 to 05/29/2021  See note below for Objective Data and Assessment of Progress/Goals.     Encounter Date: 05/29/2021   PT End of Session - 05/29/21 1514     Visit Number 4    Number of Visits 12    Date for PT Re-Evaluation 07/03/21    PT Start Time 9935    PT Stop Time 1346    PT Time Calculation (min) 33 min    Activity Tolerance Patient tolerated treatment well;No increased pain    Behavior During Therapy [facility] for tasks assessed/performed             Past Medical History:  Diagnosis Date   Anemia    Anginal pain ([place])    last cp was last year   Anxiety    Arthritis    Blood transfusion    Chest pain 02/04/2016   Coronary artery disease    Coronary artery spasm, wth continued episodes of chest pain.  09/26/2013   Coronary vasospasm (HCC)    Diabetes mellitus without complication (HCC)    type II   GERD (gastroesophageal reflux disease)    Headache    Hiatal hernia    Hypercholesteremia    Hypertension    NSTEMI (non-ST elevated myocardial infarction) ([place]) not sure   NSVT (nonsustained ventricular tachycardia) 09/26/2013   Panic attack     Past Surgical History:  Procedure Laterality Date   ABDOMINAL HYSTERECTOMY     PARTIAL HYSTERECTOMY   breast     right, tumor benign   BREAST EXCISIONAL BIOPSY Right    1961 (age 13) benign   CARDIAC CATHETERIZATION  2014   CARDIAC CATHETERIZATION  2012   CARDIAC CATHETERIZATION N/A 07/22/2016   Procedure: Left Heart Cath and Coronary Angiography;  Surgeon: Belva Crome, MD;  Location: [place] [facility];  Service: Cardiovascular;  Laterality: N/A;   cardiac stent  2012   to RCA    CHOLECYSTECTOMY N/A 10/02/2017   Procedure: LAPAROSCOPIC CHOLECYSTECTOMY;  Surgeon: Ralene Ok, MD;  Location: [place];  Service: General;  Laterality: N/A;   CORONARY ANGIOPLASTY     ESOPHAGEAL MANOMETRY N/A 10/29/2016   Procedure: ESOPHAGEAL MANOMETRY (EM);  Surgeon: Garlan Fair, MD;  [facility];  Service: Endoscopy;  Laterality: N/A;   ESOPHAGOGASTRODUODENOSCOPY (EGD) WITH PROPOFOL N/A 10/28/2016   Procedure: ESOPHAGOGASTRODUODENOSCOPY (EGD) WITH PROPOFOL;  Surgeon: Garlan Fair, MD;  [facility]: [facility];  Service: Endoscopy;  Laterality: N/A;   EXCISION OF SKIN TAG N/A 07/29/2017   Procedure: EXCISION OF PERIANAL  SKIN TAG;  Surgeon: Ralene Ok, MD;  Location: [place];  Service: General;  Laterality: N/A;   KNEE SURGERY  left   left ear surgery     for bad cut   LEFT HEART CATHETERIZATION WITH CORONARY ANGIOGRAM N/A 02/22/2013   Procedure: LEFT HEART CATHETERIZATION WITH CORONARY ANGIOGRAM;  Surgeon: [name], MD;  Location: [facility] CATH LAB;  Service: Cardiovascular;  Laterality: N/A;   SHOULDER SURGERY Bilateral    rotator cuff    There were no vitals filed for this visit.   Subjective Assessment - 05/29/21 1326     Subjective [name] is using ice during the day with 1 pain pill  before bed.  She reports good HEP compliance.  Sleeping is still interrupted by R shoulder pain.    Pertinent History HTN, CAD, Angina, previous angioplasty with stent, smoker, previous L knee and R shoulder (RTC repair) surgery    Limitations House hold activities;Lifting    Patient Stated Goals Have less pain, more AROM, better strength and a more functional R shoulder.    Currently in Pain? Yes    Pain Score 7     Pain Location Shoulder    Pain Orientation Right    Pain Descriptors / Indicators Aching;Tightness    Pain Type Chronic pain    Pain Radiating Towards Occasional R UE/hand tingling    Pain Onset More than a month ago    Pain Frequency Constant     Aggravating Factors  Reaching up with palm down, impingement position, behind the back    Pain Relieving Factors Oxycodone, ice and exercises    Effect of Pain on Daily Activities Poor sleep and all R UE function is limited    Multiple Pain Sites No                [facility] PT Assessment - 05/29/21 0001       Observation/Other Assessments   Focus on Therapeutic Outcomes (FOTO)  54 (was 37, Goal 59)      ROM / Strength   AROM / PROM / Strength AROM;Strength      AROM   Overall AROM  Deficits    AROM Assessment Site Shoulder    Right/Left Shoulder Left;Right    Right Shoulder Flexion 160 Degrees    Right Shoulder Internal Rotation 55 Degrees    Right Shoulder External Rotation 80 Degrees    Right Shoulder Horizontal  ADduction 40 Degrees    Left Shoulder Flexion 170 Degrees    Left Shoulder Internal Rotation 50 Degrees    Left Shoulder External Rotation 90 Degrees    Left Shoulder Horizontal ADduction 50 Degrees      Strength   Overall Strength Deficits    Strength Assessment Site Shoulder    Right/Left Shoulder Left;Right    Right Shoulder Internal Rotation --   201 pounds   Right Shoulder External Rotation --   15.1 pounds   Left Shoulder Internal Rotation --   18.1 pounds   Left Shoulder External Rotation --   14.2 pounds                          [facility] Adult PT Treatment/Exercise - 05/29/21 0001       Exercises   Exercises Shoulder      Shoulder Exercises: Supine   Protraction Strengthening;Right;20 reps    Protraction Weight (lbs) 4    Protraction Limitations 3 seconds (palm in, reach up towards ceiling then drop back down keeping elbow straight)    External Rotation AROM;Right;10 reps;Limitations    External Rotation Limitations 10 seconds (supine stretch with 70 degrees abduction and elbow slightly above shoulder height)    Internal Rotation AROM;Right;10 reps;Limitations    Internal Rotation Limitations 10 seconds (same set up as ER)     Flexion AROM;Right;10 reps;Limitations    Flexion Limitations 10 seconds      Shoulder Exercises: Seated   Retraction Strengthening;Both;10 reps;Limitations    Retraction Limitations 5 seconds (SBP)                     PT Education - 05/29/21 1511  Education Details Reviewed exam findings and partial review of HEP with verbal review of the rest of her HEP.    Person(s) Educated Patient    Methods Demonstration;Tactile cues;Explanation;Verbal cues    Comprehension Verbalized understanding;Need further instruction;Returned demonstration;Verbal cues required;Tactile cues required              PT Short Term Goals - 05/29/21 1512       PT SHORT TERM GOAL #1   Title Improve R shoulder AROM for flexion to 170 degrees; ER to 90 degrees; IR to 60 degrees and horizontal adduction to 40 degrees.    Baseline 160; 80; 55; 40 respectively  (was 135; 70; 35; 35 respectively)    Time 4    Period Weeks    Status Partially Met    Target Date 06/05/21               PT Long Term Goals - 05/29/21 1513       PT LONG TERM GOAL #1   Title Improve FOTO to 59.    Baseline 54 (was 37)    Time 8    Period Weeks    Status On-going    Target Date 07/03/21      PT LONG TERM GOAL #2   Title Decrease R shoulder pain to consistently 0-3/10 on the Numeric Pain Rating Scale.    Baseline Can be 7/10 and limit sleep    Time 8    Period Weeks    Status On-going    Target Date 07/03/21      PT LONG TERM GOAL #3   Title Improve R shoulder strength to 100% of the uninvolved L.    Baseline Met (was < 60% at evaluation).    Time 8    Period Weeks    Status Achieved      PT LONG TERM GOAL #4   Title [name] will be independent with her long-term maintenence  HEP at DC.    Baseline Prescribed today    Time 8    Period Weeks    Status On-going    Target Date 07/03/21                   Plan - 05/29/21 1514     Clinical Impression Statement [name] is making excellent  progress with her objective AROM, strength and self-reported function.  Strength has nearly doubled with AROM and self-reported function much better.  Pain remains functionally limiting, particularly at night.  I recommend [name] continue her current supervised PT to continue to address AROM, strength and functional impairments up to her 06/17/2021 appointment with Dr. Sharol Given with another RA at that time to make additional recommendations.    Personal Factors and Comorbidities Comorbidity 3+    Comorbidities HTN, CAD, Angina, previous angioplasty with stent, smoker, previous L knee and R shoulder (RTC repair) surgery    Examination-Activity Limitations Dressing;Sleep;Bed Mobility;Lift;Carry;Reach Overhead    Examination-Participation Restrictions Community Activity    Stability/Clinical Decision Making Stable/Uncomplicated    Rehab Potential Good    PT Frequency Other (comment)   1-2X/week   PT Duration 3 weeks    PT Treatment/Interventions ADLs/Self Care Home Management;Moist Heat;Cryotherapy;Therapeutic activities;Therapeutic exercise;Neuromuscular re-education;Patient/family education;Manual techniques;Passive range of motion    PT Next Visit Plan Continue AROM, capsular flexibility and strength progressions    PT Home Exercise Plan Access Code: [email or web address]    Consulted and Agree with Plan of Care Patient  Patient will benefit from skilled therapeutic intervention in order to improve the following deficits and impairments:  Decreased range of motion, Decreased strength, Increased edema, Impaired flexibility, Impaired UE functional use, Pain  Visit Diagnosis: Muscle weakness (generalized)  Stiffness of right shoulder, not elsewhere classified  Chronic right shoulder pain  Localized edema     Problem List Patient Active Problem List   Diagnosis Date Noted   Variant angina ([place]) 01/24/2017   Chest pain 01/23/2017   Headache 01/23/2017   S/P angioplasty with stent 01/23/2017    Coronary artery disease 11/24/2014   Syncope 10/22/2014   Hyperglycemia 04/20/2014   H/O hiatal hernia 04/19/2014   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 [place], [place], [address] Phone: [phone]   Fax:  [phone]  Name: [name] MRN: [id number] Date of Birth: 11-19-1945

## 2021-05-31 ENCOUNTER — Ambulatory Visit (INDEPENDENT_AMBULATORY_CARE_PROVIDER_SITE_OTHER): Payer: Medicare Other | Admitting: Rehabilitative and Restorative Service Providers"

## 2021-05-31 ENCOUNTER — Other Ambulatory Visit: Payer: Self-pay

## 2021-05-31 ENCOUNTER — Encounter: Payer: Self-pay | Admitting: Rehabilitative and Restorative Service Providers"

## 2021-05-31 DIAGNOSIS — R600 Localized edema: Secondary | ICD-10-CM

## 2021-05-31 DIAGNOSIS — M6281 Muscle weakness (generalized): Secondary | ICD-10-CM

## 2021-05-31 DIAGNOSIS — M25511 Pain in right shoulder: Secondary | ICD-10-CM

## 2021-05-31 DIAGNOSIS — M25611 Stiffness of right shoulder, not elsewhere classified: Secondary | ICD-10-CM

## 2021-05-31 DIAGNOSIS — G8929 Other chronic pain: Secondary | ICD-10-CM | POA: Diagnosis not present

## 2021-05-31 NOTE — Patient Instructions (Signed)
Continue current HEP with select activities on the L as well (theraband ER/IR, thumb up the back, scapular retraction and protraction).

## 2021-05-31 NOTE — Therapy (Signed)
[facility] Physical Therapy [address] [place], [place], [address] Phone: [phone]   Fax:  [phone]  Physical Therapy Treatment  Patient Details  Name: [name] MRN: [id number] Date of Birth: June 08, 1946 Referring Provider (PT): Newt Minion MD   Encounter Date: 05/31/2021   PT End of Session - 05/31/21 1730     Visit Number 5    Number of Visits 12    Date for PT Re-Evaluation 07/03/21    PT Start Time 1100    PT Stop Time 1145    PT Time Calculation (min) 45 min    Activity Tolerance Patient tolerated treatment well;No increased pain    Behavior During Therapy [facility] for tasks assessed/performed             Past Medical History:  Diagnosis Date   Anemia    Anginal pain ([place])    last cp was last year   Anxiety    Arthritis    Blood transfusion    Chest pain 02/04/2016   Coronary artery disease    Coronary artery spasm, wth continued episodes of chest pain.  09/26/2013   Coronary vasospasm (HCC)    Diabetes mellitus without complication (HCC)    type II   GERD (gastroesophageal reflux disease)    Headache    Hiatal hernia    Hypercholesteremia    Hypertension    NSTEMI (non-ST elevated myocardial infarction) ([place]) not sure   NSVT (nonsustained ventricular tachycardia) 09/26/2013   Panic attack     Past Surgical History:  Procedure Laterality Date   ABDOMINAL HYSTERECTOMY     PARTIAL HYSTERECTOMY   breast     right, tumor benign   BREAST EXCISIONAL BIOPSY Right    1961 (age 24) benign   CARDIAC CATHETERIZATION  2014   CARDIAC CATHETERIZATION  2012   CARDIAC CATHETERIZATION N/A 07/22/2016   Procedure: Left Heart Cath and Coronary Angiography;  Surgeon: Belva Crome, MD;  Location: [place] [facility];  Service: Cardiovascular;  Laterality: N/A;   cardiac stent  2012   to RCA   CHOLECYSTECTOMY N/A 10/02/2017   Procedure: LAPAROSCOPIC CHOLECYSTECTOMY;  Surgeon: Ralene Ok, MD;  Location: [place];  Service: General;   Laterality: N/A;   CORONARY ANGIOPLASTY     ESOPHAGEAL MANOMETRY N/A 10/29/2016   Procedure: ESOPHAGEAL MANOMETRY (EM);  Surgeon: Garlan Fair, MD;  Location: [facility];  Service: Endoscopy;  Laterality: N/A;   ESOPHAGOGASTRODUODENOSCOPY (EGD) WITH PROPOFOL N/A 10/28/2016   Procedure: ESOPHAGOGASTRODUODENOSCOPY (EGD) WITH PROPOFOL;  Surgeon: Garlan Fair, MD;  Location: [facility];  Service: Endoscopy;  Laterality: N/A;   EXCISION OF SKIN TAG N/A 07/29/2017   Procedure: EXCISION OF PERIANAL  SKIN TAG;  Surgeon: Ralene Ok, MD;  Location: [place];  Service: General;  Laterality: N/A;   KNEE SURGERY  left   left ear surgery     for bad cut   LEFT HEART CATHETERIZATION WITH CORONARY ANGIOGRAM N/A 02/22/2013   Procedure: LEFT HEART CATHETERIZATION WITH CORONARY ANGIOGRAM;  Surgeon: [name], MD;  Location: [facility];  Service: Cardiovascular;  Laterality: N/A;   SHOULDER SURGERY Bilateral    rotator cuff    There were no vitals filed for this visit.   Subjective Assessment - 05/31/21 1138     Subjective [name] feels as if she is gaining AROM and feels less stiff.  She is only able to sleep about 2 hours uninterrupted and 4-5 hours/night.    Pertinent History HTN, CAD,  Angina, previous angioplasty with stent, smoker, previous L knee and R shoulder (RTC repair) surgery    Limitations House hold activities;Lifting    Patient Stated Goals Have less pain, more AROM, better strength and a more functional R shoulder.    Currently in Pain? Yes    Pain Score 5     Pain Location Shoulder    Pain Orientation Right    Pain Descriptors / Indicators Aching;Tightness    Pain Type Chronic pain    Pain Onset More than a month ago    Pain Frequency Constant    Aggravating Factors  Reaching up with palm down, impingement and behind the back    Pain Relieving Factors Oxycodone, ice and exercises    Effect of Pain on Daily Activities Poor sleep and all R UE function is limited     Multiple Pain Sites No                               [facility] Adult PT Treatment/Exercise - 05/31/21 0001       Exercises   Exercises Shoulder      Shoulder Exercises: Supine   Protraction Strengthening;Right;20 reps    Protraction Weight (lbs) 5    Protraction Limitations 3 seconds (palm in, reach up towards ceiling then drop back down keeping elbow straight)    External Rotation AROM;Right;10 reps;Limitations    External Rotation Limitations 10 seconds (supine stretch with 70 degrees abduction and elbow slightly above shoulder height)    Internal Rotation AROM;Right;10 reps;Limitations    Internal Rotation Limitations 10 seconds (same set up as ER)    Flexion AROM;Right;10 reps;Limitations    Flexion Limitations 10 seconds      Shoulder Exercises: Seated   Retraction Strengthening;Both;10 reps;Limitations    Retraction Limitations 5 seconds (SBP)      Shoulder Exercises: Standing   External Rotation Strengthening;Right;10 reps;Theraband;Limitations    Theraband Level (Shoulder External Rotation) Level 2 (Red)    External Rotation Limitations 3 sets 3 second hold slow eccentrics towel under elbow    Internal Rotation Strengthening;Right;10 reps;Theraband    Theraband Level (Shoulder Internal Rotation) Level 2 (Red)    Internal Rotation Limitations 3 seconds slow eccentrics towel under elbow, 2 sets    Extension Strengthening;Both;10 reps;Theraband;Limitations    Theraband Level (Shoulder Extension) Level 2 (Red)    Extension Limitations Palms up 3 second hold    Row Strengthening;Both;20 reps;Theraband    Theraband Level (Shoulder Row) Level 4 (Blue)    Row Limitations 3 seconds slow eccentric    Other Standing Exercises Posterior capsule stretch 10X 10 seconds    Other Standing Exercises Thumb up the back stretch 10X 10 seconds      Shoulder Exercises: ROM/Strengthening   UBE (Upper Arm Bike) 5 minutes Level 4                     PT  Education - 05/31/21 1729     Education Details Review HEP with recommendations to add certain activities to the uninvolved L shoulder (see above).  Progressed strength activities in the office.    Person(s) Educated Patient    Methods Explanation;Demonstration;Verbal cues    Comprehension Verbalized understanding;Returned demonstration;Verbal cues required;Need further instruction              PT Short Term Goals - 05/29/21 1512       PT SHORT TERM GOAL #1   Title Improve  R shoulder AROM for flexion to 170 degrees; ER to 90 degrees; IR to 60 degrees and horizontal adduction to 40 degrees.    Baseline 160; 80; 55; 40 respectively  (was 135; 70; 35; 35 respectively)    Time 4    Period Weeks    Status Partially Met    Target Date 06/05/21               PT Long Term Goals - 05/29/21 1513       PT LONG TERM GOAL #1   Title Improve FOTO to 59.    Baseline 54 (was 37)    Time 8    Period Weeks    Status On-going    Target Date 07/03/21      PT LONG TERM GOAL #2   Title Decrease R shoulder pain to consistently 0-3/10 on the Numeric Pain Rating Scale.    Baseline Can be 7/10 and limit sleep    Time 8    Period Weeks    Status On-going    Target Date 07/03/21      PT LONG TERM GOAL #3   Title Improve R shoulder strength to 100% of the uninvolved L.    Baseline Met (was < 60% at evaluation).    Time 8    Period Weeks    Status Achieved      PT LONG TERM GOAL #4   Title [name] will be independent with her long-term maintenence  HEP at DC.    Baseline Prescribed today    Time 8    Period Weeks    Status On-going    Target Date 07/03/21                   Plan - 05/31/21 1731     Clinical Impression Statement [name] notes progress since starting PT, although sleep and pain are still limiting function.  Progressed strength today and recommended certain activities on the L side as well given soreness there.  Continue POC as documented last visit?RA up to her  follow-up with Dr. Sharol Given with another assessment then to make additional recommendations.    Personal Factors and Comorbidities Comorbidity 3+    Comorbidities HTN, CAD, Angina, previous angioplasty with stent, smoker, previous L knee and R shoulder (RTC repair) surgery    Examination-Activity Limitations Dressing;Sleep;Bed Mobility;Lift;Carry;Reach Overhead    Examination-Participation Restrictions Community Activity    Stability/Clinical Decision Making Stable/Uncomplicated    Rehab Potential Good    PT Frequency Other (comment)   1-2X/week   PT Duration 3 weeks    PT Treatment/Interventions ADLs/Self Care Home Management;Moist Heat;Cryotherapy;Therapeutic activities;Therapeutic exercise;Neuromuscular re-education;Patient/family education;Manual techniques;Passive range of motion    PT Next Visit Plan Continue AROM, capsular flexibility and RTC/scapular strength progressions    PT Home Exercise Plan Access Code: [email or web address]    Consulted and Agree with Plan of Care Patient             Patient will benefit from skilled therapeutic intervention in order to improve the following deficits and impairments:  Decreased range of motion, Decreased strength, Increased edema, Impaired flexibility, Impaired UE functional use, Pain  Visit Diagnosis: Muscle weakness (generalized)  Stiffness of right shoulder, not elsewhere classified  Chronic right shoulder pain  Localized edema     Problem List Patient Active Problem List   Diagnosis Date Noted   Variant angina ([place]) 01/24/2017   Chest pain 01/23/2017   Headache 01/23/2017   S/P angioplasty with stent 01/23/2017  Coronary artery disease 11/24/2014   Syncope 10/22/2014   Hyperglycemia 04/20/2014   H/O hiatal hernia 04/19/2014   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

## 2021-06-04 ENCOUNTER — Other Ambulatory Visit: Payer: Self-pay | Admitting: Gastroenterology

## 2021-06-04 DIAGNOSIS — R1013 Epigastric pain: Secondary | ICD-10-CM | POA: Diagnosis not present

## 2021-06-04 DIAGNOSIS — K5900 Constipation, unspecified: Secondary | ICD-10-CM | POA: Diagnosis not present

## 2021-06-04 DIAGNOSIS — R1310 Dysphagia, unspecified: Secondary | ICD-10-CM | POA: Diagnosis not present

## 2021-06-04 DIAGNOSIS — R109 Unspecified abdominal pain: Secondary | ICD-10-CM

## 2021-06-05 ENCOUNTER — Encounter: Payer: Self-pay | Admitting: Rehabilitative and Restorative Service Providers"

## 2021-06-05 ENCOUNTER — Other Ambulatory Visit: Payer: Self-pay

## 2021-06-05 ENCOUNTER — Ambulatory Visit (INDEPENDENT_AMBULATORY_CARE_PROVIDER_SITE_OTHER): Payer: Medicare Other | Admitting: Rehabilitative and Restorative Service Providers"

## 2021-06-05 DIAGNOSIS — M25511 Pain in right shoulder: Secondary | ICD-10-CM | POA: Diagnosis not present

## 2021-06-05 DIAGNOSIS — M25611 Stiffness of right shoulder, not elsewhere classified: Secondary | ICD-10-CM

## 2021-06-05 DIAGNOSIS — G8929 Other chronic pain: Secondary | ICD-10-CM | POA: Diagnosis not present

## 2021-06-05 DIAGNOSIS — R600 Localized edema: Secondary | ICD-10-CM

## 2021-06-05 DIAGNOSIS — M6281 Muscle weakness (generalized): Secondary | ICD-10-CM

## 2021-06-05 NOTE — Therapy (Signed)
[facility] Physical Therapy [address] [place], [place], [address] Phone: [phone]   Fax:  [phone]  Physical Therapy Treatment  Patient Details  Name: [name] MRN: [id number] Date of Birth: Dec 25, 1945 Referring Provider (PT): Newt Minion MD   Encounter Date: 06/05/2021   PT End of Session - 06/05/21 1343     Visit Number 6    Number of Visits 12    Date for PT Re-Evaluation 07/03/21    PT Start Time 1856    PT Stop Time 1350    PT Time Calculation (min) 55 min    Activity Tolerance Patient tolerated treatment well;No increased pain    Behavior During Therapy [facility] for tasks assessed/performed             Past Medical History:  Diagnosis Date   Anemia    Anginal pain ([place])    last cp was last year   Anxiety    Arthritis    Blood transfusion    Chest pain 02/04/2016   Coronary artery disease    Coronary artery spasm, wth continued episodes of chest pain.  09/26/2013   Coronary vasospasm (HCC)    Diabetes mellitus without complication (HCC)    type II   GERD (gastroesophageal reflux disease)    Headache    Hiatal hernia    Hypercholesteremia    Hypertension    NSTEMI (non-ST elevated myocardial infarction) ([place]) not sure   NSVT (nonsustained ventricular tachycardia) 09/26/2013   Panic attack     Past Surgical History:  Procedure Laterality Date   ABDOMINAL HYSTERECTOMY     PARTIAL HYSTERECTOMY   breast     right, tumor benign   BREAST EXCISIONAL BIOPSY Right    1961 (age 70) benign   CARDIAC CATHETERIZATION  2014   CARDIAC CATHETERIZATION  2012   CARDIAC CATHETERIZATION N/A 07/22/2016   Procedure: Left Heart Cath and Coronary Angiography;  Surgeon: Belva Crome, MD;  Location: [place] [facility];  Service: Cardiovascular;  Laterality: N/A;   cardiac stent  2012   to RCA   CHOLECYSTECTOMY N/A 10/02/2017   Procedure: LAPAROSCOPIC CHOLECYSTECTOMY;  Surgeon: Ralene Ok, MD;  Location: [place];  Service: General;   Laterality: N/A;   CORONARY ANGIOPLASTY     ESOPHAGEAL MANOMETRY N/A 10/29/2016   Procedure: ESOPHAGEAL MANOMETRY (EM);  Surgeon: Garlan Fair, MD;  [facility];  Service: Endoscopy;  Laterality: N/A;   ESOPHAGOGASTRODUODENOSCOPY (EGD) WITH PROPOFOL N/A 10/28/2016   Procedure: ESOPHAGOGASTRODUODENOSCOPY (EGD) WITH PROPOFOL;  Surgeon: Garlan Fair, MD;  [facility];  Service: Endoscopy;  Laterality: N/A;   EXCISION OF SKIN TAG N/A 07/29/2017   Procedure: EXCISION OF PERIANAL  SKIN TAG;  Surgeon: Ralene Ok, MD;  Location: [place];  Service: General;  Laterality: N/A;   KNEE SURGERY  left   left ear surgery     for bad cut   LEFT HEART CATHETERIZATION WITH CORONARY ANGIOGRAM N/A 02/22/2013   Procedure: LEFT HEART CATHETERIZATION WITH CORONARY ANGIOGRAM;  Surgeon: [name], MD;  Location: [facility] CATH LAB;  Service: Cardiovascular;  Laterality: N/A;   SHOULDER SURGERY Bilateral    rotator cuff    There were no vitals filed for this visit.   Subjective Assessment - 06/05/21 1301     Subjective Anokhi notes more pain today, possibly associated with the rain.  Sleep is still about 2 hours uninterrupted and 4-5 hours at best throughout the night.    Pertinent History HTN, CAD,  Angina, previous angioplasty with stent, smoker, previous L knee and R shoulder (RTC repair) surgery    Limitations House hold activities;Lifting    Patient Stated Goals Have less pain, more AROM, better strength and a more functional R shoulder.    Currently in Pain? Yes    Pain Score 7     Pain Location Shoulder    Pain Orientation Right    Pain Descriptors / Indicators Aching;Tightness    Pain Type Chronic pain    Pain Radiating Towards Occasional R hand tingling digits 1-3    Pain Onset More than a month ago    Pain Frequency Constant    Aggravating Factors  Reaching with palm down and impingement postures    Pain Relieving Factors Oxycodone, ice and exercises    Effect of  Pain on Daily Activities Poor sleep, all R UE function is limited and L shoulder is showing signs of overuse    Multiple Pain Sites No                               [facility] Adult PT Treatment/Exercise - 06/05/21 0001       Exercises   Exercises Shoulder      Shoulder Exercises: Supine   Protraction Strengthening;Right;20 reps    Protraction Weight (lbs) 4    Protraction Limitations 3 seconds (palm in, reach up towards ceiling then drop back down keeping elbow straight)    External Rotation AROM;Right;10 reps;Limitations    External Rotation Limitations 10 seconds (supine stretch with 70 degrees abduction and elbow slightly above shoulder height)    Internal Rotation AROM;Right;10 reps;Limitations    Internal Rotation Limitations 10 seconds (same set up as ER)    Flexion AROM;Right;10 reps;Limitations    Flexion Limitations 10 seconds      Shoulder Exercises: Seated   Retraction Strengthening;Both;10 reps;Limitations    Retraction Limitations 5 seconds (SBP)    Other Seated Exercises Carpal tunnel stretch (wrist flexors stretch include thumb) 10X 10 seconds      Shoulder Exercises: Standing   External Rotation Strengthening;10 reps;Theraband;Both;Limitations    Theraband Level (Shoulder External Rotation) Level 2 (Red)    External Rotation Limitations 2 sets R and 1 set L 3 second hold slow eccentrics towel under elbow    Internal Rotation Strengthening;Right;10 reps;Theraband    Theraband Level (Shoulder Internal Rotation) Level 2 (Red)    Internal Rotation Limitations 3 seconds slow eccentrics towel under elbow    Extension Strengthening;Both;10 reps;Theraband;Limitations    Theraband Level (Shoulder Extension) Level 2 (Red)    Extension Limitations Palms up 3 second hold    Row Strengthening;Both;20 reps;Theraband    Theraband Level (Shoulder Row) Level 4 (Blue)    Row Limitations 3 seconds slow eccentric    Other Standing Exercises Posterior capsule  stretch 10X 10 seconds    Other Standing Exercises Thumb up the back stretch 10X 10 seconds      Shoulder Exercises: ROM/Strengthening   UBE (Upper Arm Bike) 5 minutes Level 4.5      Cryotherapy   Number Minutes Cryotherapy 10 Minutes    Cryotherapy Location Shoulder;Other (comment)   Head   Type of Cryotherapy Ice pack                     PT Education - 06/05/21 1315     Education Details Discussed decreasing theraband to 1X/day (was doing 3X/day, 2X/day recommended) to avoid overuse.  Person(s) Educated Patient    Methods Explanation;Demonstration;Verbal cues;Tactile cues    Comprehension Verbalized understanding;Returned demonstration;Verbal cues required;Need further instruction;Tactile cues required              PT Short Term Goals - 05/29/21 1512       PT SHORT TERM GOAL #1   Title Improve R shoulder AROM for flexion to 170 degrees; ER to 90 degrees; IR to 60 degrees and horizontal adduction to 40 degrees.    Baseline 160; 80; 55; 40 respectively  (was 135; 70; 35; 35 respectively)    Time 4    Period Weeks    Status Partially Met    Target Date 06/05/21               PT Long Term Goals - 05/29/21 1513       PT LONG TERM GOAL #1   Title Improve FOTO to 59.    Baseline 54 (was 37)    Time 8    Period Weeks    Status On-going    Target Date 07/03/21      PT LONG TERM GOAL #2   Title Decrease R shoulder pain to consistently 0-3/10 on the Numeric Pain Rating Scale.    Baseline Can be 7/10 and limit sleep    Time 8    Period Weeks    Status On-going    Target Date 07/03/21      PT LONG TERM GOAL #3   Title Improve R shoulder strength to 100% of the uninvolved L.    Baseline Met (was < 60% at evaluation).    Time 8    Period Weeks    Status Achieved      PT LONG TERM GOAL #4   Title Marty will be independent with her long-term maintenence  HEP at DC.    Baseline Prescribed today    Time 8    Period Weeks    Status On-going     Target Date 07/03/21                   Plan - 06/05/21 1344     Clinical Impression Statement Overall AROM and strength are significantly improved (RA next visit to assess progress since last measurement).  Saleema may be overdoing her strength (3X/day, 2X recommended) so I encouraged her to take the rest of the day and tomorrow off from her exercises.  She may also be having more pain due to the rainy cloudy weather today.  We will RA next week in preparation for her appointment with Dr. Sharol Given next week.  We are looking at all options as he has already let Eriana know she is a reverse TSA candidate.    Personal Factors and Comorbidities Comorbidity 3+    Comorbidities HTN, CAD, Angina, previous angioplasty with stent, smoker, previous L knee and R shoulder (RTC repair) surgery    Examination-Activity Limitations Dressing;Sleep;Bed Mobility;Lift;Carry;Reach Overhead    Examination-Participation Restrictions Community Activity    Stability/Clinical Decision Making Stable/Uncomplicated    Rehab Potential Good    PT Frequency Other (comment)   1-2X/week   PT Duration 3 weeks    PT Treatment/Interventions ADLs/Self Care Home Management;Moist Heat;Cryotherapy;Therapeutic activities;Therapeutic exercise;Neuromuscular re-education;Patient/family education;Manual techniques;Passive range of motion    PT Next Visit Plan RA and FOTO    PT Home Exercise Plan Access Code: [email or web address]    Consulted and Agree with Plan of Care Patient  Patient will benefit from skilled therapeutic intervention in order to improve the following deficits and impairments:  Decreased range of motion, Decreased strength, Increased edema, Impaired flexibility, Impaired UE functional use, Pain  Visit Diagnosis: Muscle weakness (generalized)  Stiffness of right shoulder, not elsewhere classified  Chronic right shoulder pain  Localized edema     Problem List Patient Active Problem List   Diagnosis Date  Noted   Variant angina ([place]) 01/24/2017   Chest pain 01/23/2017   Headache 01/23/2017   S/P angioplasty with stent 01/23/2017   Coronary artery disease 11/24/2014   Syncope 10/22/2014   Hyperglycemia 04/20/2014   H/O hiatal hernia 04/19/2014   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

## 2021-06-05 NOTE — Patient Instructions (Signed)
Access Code: [place] URL: [email or web address] Date: 06/05/2021 Prepared by: Vista Mink  Exercises Standing Scapular Retraction - 5 x daily - 7 x weekly - 1 sets - 5 reps - 5 second hold Supine Scapular Protraction in Flexion with Dumbbells - 2 x daily - 7 x weekly - 1 sets - 20 reps - 3 seconds hold Shoulder External Rotation with Anchored Resistance with Towel Under Elbow - 1 x daily - 7 x weekly - 2-3 sets - 10 reps - 3 hold Supine Shoulder External Rotation Stretch - 2 x daily - 7 x weekly - 1 sets - 10 reps - 10 seconds hold Shoulder Internal Rotation with Resistance - 1 x daily - 7 x weekly - 1 sets - 10 reps - 3 seconds hold Standing Shoulder Internal Rotation Stretch with Hands Behind Back - 2 x daily - 7 x weekly - 1 sets - 10 reps - 10 seconds hold Standing Shoulder Posterior Capsule Stretch - 2 x daily - 7 x weekly - 1 sets - 5 reps - 10 hold Supine Shoulder Flexion AAROM with Hands Clasped - 2 x daily - 7 x weekly - 1 sets - 10-20 reps - 10 seconds hold

## 2021-06-07 ENCOUNTER — Encounter: Payer: Self-pay | Admitting: Rehabilitative and Restorative Service Providers"

## 2021-06-07 ENCOUNTER — Other Ambulatory Visit: Payer: Self-pay

## 2021-06-07 ENCOUNTER — Ambulatory Visit (INDEPENDENT_AMBULATORY_CARE_PROVIDER_SITE_OTHER): Payer: Medicare Other | Admitting: Rehabilitative and Restorative Service Providers"

## 2021-06-07 DIAGNOSIS — G8929 Other chronic pain: Secondary | ICD-10-CM

## 2021-06-07 DIAGNOSIS — M25611 Stiffness of right shoulder, not elsewhere classified: Secondary | ICD-10-CM

## 2021-06-07 DIAGNOSIS — R600 Localized edema: Secondary | ICD-10-CM

## 2021-06-07 DIAGNOSIS — M6281 Muscle weakness (generalized): Secondary | ICD-10-CM | POA: Diagnosis not present

## 2021-06-07 DIAGNOSIS — M25511 Pain in right shoulder: Secondary | ICD-10-CM | POA: Diagnosis not present

## 2021-06-07 NOTE — Patient Instructions (Signed)
Access Code: [place] URL: [email or web address] Date: 06/07/2021 Prepared by: Vista Mink  Exercises Standing Scapular Retraction - 5 x daily - 7 x weekly - 1 sets - 5 reps - 5 second hold Supine Scapular Protraction in Flexion with Dumbbells - 1 x daily - 3 x weekly - 1 sets - 20 reps - 3 seconds hold Shoulder External Rotation with Anchored Resistance with Towel Under Elbow - 1 x daily - 3 x weekly - 2-3 sets - 10 reps - 3 hold Supine Shoulder External Rotation Stretch - 1 x daily - 7 x weekly - 1 sets - 10 reps - 10 seconds hold Shoulder Internal Rotation with Resistance - 1 x daily - 3 x weekly - 1 sets - 10 reps - 3 seconds hold Standing Shoulder Internal Rotation Stretch with Hands Behind Back - 1 x daily - 7 x weekly - 1 sets - 10 reps - 10 seconds hold Standing Shoulder Posterior Capsule Stretch - 1 x daily - 7 x weekly - 1 sets - 5 reps - 10 hold Supine Shoulder Flexion AAROM with Hands Clasped - 1 x daily - 7 x weekly - 1 sets - 10-20 reps - 10 seconds hold Standing Lumbar Extension at Wall - Forearms - 5 x daily - 7 x weekly - 1 sets - 5 reps - 3 seconds hold

## 2021-06-07 NOTE — Therapy (Signed)
[facility] [address] [place], [place], [address] Phone: [phone]   Fax:  [phone]  Physical Therapy Treatment/Discharge  Patient Details  Name: Tanaysia Bhardwaj MRN: [id number] Date of Birth: 1946-07-01 Referring Provider (PT): Newt Minion MD  PHYSICAL THERAPY DISCHARGE SUMMARY  Visits from Start of Care: 7  Current functional level related to goals / functional outcomes: See note   Remaining deficits: See note   Education / Equipment: Updated HEP   Patient agrees to discharge. Patient goals were partially met. Patient is being discharged due to maximized rehab potential.    Encounter Date: 06/07/2021   PT End of Session - 06/07/21 1407     Visit Number 7    Number of Visits 12    Date for PT Re-Evaluation 07/03/21    PT Start Time 1102    PT Stop Time 1146    PT Time Calculation (min) 44 min    Activity Tolerance Patient tolerated treatment well;No increased pain    Behavior During Therapy [facility] for tasks assessed/performed             Past Medical History:  Diagnosis Date   Anemia    Anginal pain ([place])    last cp was last year   Anxiety    Arthritis    Blood transfusion    Chest pain 02/04/2016   Coronary artery disease    Coronary artery spasm, wth continued episodes of chest pain.  09/26/2013   Coronary vasospasm (HCC)    Diabetes mellitus without complication (HCC)    type II   GERD (gastroesophageal reflux disease)    Headache    Hiatal hernia    Hypercholesteremia    Hypertension    NSTEMI (non-ST elevated myocardial infarction) ([place]) not sure   NSVT (nonsustained ventricular tachycardia) 09/26/2013   Panic attack     Past Surgical History:  Procedure Laterality Date   ABDOMINAL HYSTERECTOMY     PARTIAL HYSTERECTOMY   breast     right, tumor benign   BREAST EXCISIONAL BIOPSY Right    1961 (age 47) benign   CARDIAC CATHETERIZATION  2014   CARDIAC CATHETERIZATION  2012   CARDIAC  CATHETERIZATION N/A 07/22/2016   Procedure: Left Heart Cath and Coronary Angiography;  Surgeon: Belva Crome, MD;  Location: [place] CV [facility];  Service: Cardiovascular;  Laterality: N/A;   cardiac stent  2012   to RCA   CHOLECYSTECTOMY N/A 10/02/2017   Procedure: LAPAROSCOPIC CHOLECYSTECTOMY;  Surgeon: Ralene Ok, MD;  Location: [place];  Service: General;  Laterality: N/A;   CORONARY ANGIOPLASTY     ESOPHAGEAL MANOMETRY N/A 10/29/2016   Procedure: ESOPHAGEAL MANOMETRY (EM);  Surgeon: Garlan Fair, MD;  [facility];  Service: Endoscopy;  Laterality: N/A;   ESOPHAGOGASTRODUODENOSCOPY (EGD) WITH PROPOFOL N/A 10/28/2016   Procedure: ESOPHAGOGASTRODUODENOSCOPY (EGD) WITH PROPOFOL;  Surgeon: Garlan Fair, MD;  [facility];  Service: Endoscopy;  Laterality: N/A;   EXCISION OF SKIN TAG N/A 07/29/2017   Procedure: EXCISION OF PERIANAL  SKIN TAG;  Surgeon: Ralene Ok, MD;  Location: [place];  Service: General;  Laterality: N/A;   KNEE SURGERY  left   left ear surgery     for bad cut   LEFT HEART CATHETERIZATION WITH CORONARY ANGIOGRAM N/A 02/22/2013   Procedure: LEFT HEART CATHETERIZATION WITH CORONARY ANGIOGRAM;  Surgeon: [name], MD;  Location: [facility] C[facility];  Service: Cardiovascular;  Laterality: N/A;   SHOULDER SURGERY Bilateral  rotator cuff    There were no vitals filed for this visit.   Subjective Assessment - 06/07/21 1109     Subjective Desirae was able to get 4 hours of sleep uninterrupted after taking a break from her exercises post her last appointment.    Pertinent History HTN, CAD, Angina, previous angioplasty with stent, smoker, previous L knee and R shoulder (RTC repair) surgery    Limitations House hold activities;Lifting    Patient Stated Goals Have less pain, more AROM, better strength and a more functional R shoulder.    Currently in Pain? Yes    Pain Score 6     Pain Location Shoulder    Pain Orientation Right    Pain  Descriptors / Indicators Aching;Tightness    Pain Type Chronic pain    Pain Radiating Towards Occasional R hand tingling digits 1-3    Pain Onset More than a month ago    Pain Frequency Constant    Aggravating Factors  Reaching with palm down and impingement postures    Pain Relieving Factors Oxycodone, ice and exercises    Effect of Pain on Daily Activities Poor sleep, all R UE function is limited and L shoulder is showing signs of overuse    Multiple Pain Sites No                [facility] PT Assessment - 06/07/21 0001       Observation/Other Assessments   Focus on Therapeutic Outcomes (FOTO)  53 (was 37, Goal 59)      ROM / Strength   AROM / PROM / Strength AROM;Strength      AROM   Overall AROM  Deficits    AROM Assessment Site Shoulder    Right/Left Shoulder Left;Right    Right Shoulder Flexion 165 Degrees    Right Shoulder Internal Rotation 60 Degrees    Right Shoulder External Rotation 85 Degrees    Right Shoulder Horizontal  ADduction 45 Degrees    Left Shoulder Flexion 170 Degrees    Left Shoulder Internal Rotation 60 Degrees    Left Shoulder External Rotation 90 Degrees    Left Shoulder Horizontal ADduction 50 Degrees      Strength   Overall Strength Deficits    Strength Assessment Site Shoulder    Right/Left Shoulder Left;Right    Right Shoulder Internal Rotation --   20.7 pounds   Right Shoulder External Rotation --   15.2 pounds   Left Shoulder Internal Rotation --   21.3 pounds   Left Shoulder External Rotation --   16.1 pounds                          [facility] Adult PT Treatment/Exercise - 06/07/21 0001       Exercises   Exercises Shoulder      Shoulder Exercises: Supine   Protraction Strengthening;Right;20 reps    Protraction Weight (lbs) 4    Protraction Limitations 3 seconds (palm in, reach up towards ceiling then drop back down keeping elbow straight)    External Rotation AROM;Right;10 reps;Limitations    External Rotation  Limitations 10 seconds (supine stretch with 70 degrees abduction and elbow slightly above shoulder height)    Internal Rotation AROM;Right;10 reps;Limitations    Internal Rotation Limitations 10 seconds (same set up as ER)    Flexion AROM;Right;10 reps;Limitations    Flexion Limitations 10 seconds      Shoulder Exercises: Seated   Retraction Strengthening;Both;10 reps;Limitations  Retraction Limitations 5 seconds (SBP)    Other Seated Exercises Carpal tunnel stretch (wrist flexors stretch include thumb) 10X 10 seconds      Shoulder Exercises: Standing   External Rotation Strengthening;10 reps;Theraband;Both;Limitations    Theraband Level (Shoulder External Rotation) Level 2 (Red)    External Rotation Limitations 2 sets R and 1 set L 3 second hold slow eccentrics towel under elbow    Internal Rotation Strengthening;Right;10 reps;Theraband    Theraband Level (Shoulder Internal Rotation) Level 2 (Red)    Internal Rotation Limitations 3 seconds slow eccentrics towel under elbow    Extension --    Theraband Level (Shoulder Extension) --    Extension Limitations --    Row --    Theraband Level (Shoulder Row) --    Row Limitations --    Other Standing Exercises Posterior capsule stretch 10X 10 seconds    Other Standing Exercises Thumb up the back stretch 10X 10 seconds                     PT Education - 06/07/21 1405     Education Details Reviewed RA findings and made adjustments to her long-term DC HEP.    Person(s) Educated Patient    Methods Explanation;Demonstration;Tactile cues;Verbal cues;Handout    Comprehension Verbalized understanding;Returned demonstration;Verbal cues required              PT Short Term Goals - 06/07/21 1406       PT SHORT TERM GOAL #1   Title Improve R shoulder AROM for flexion to 170 degrees; ER to 90 degrees; IR to 60 degrees and horizontal adduction to 40 degrees.    Baseline 165; 85; 60; 45 respectively  (was 135; 70; 35; 35  respectively)  97% met    Time 4    Period Weeks    Status Achieved    Target Date 06/05/21               PT Long Term Goals - 06/07/21 1406       PT LONG TERM GOAL #1   Title Improve FOTO to 59.    Baseline 53 (was 37)    Time 8    Period Weeks    Status On-going    Target Date 07/03/21      PT LONG TERM GOAL #2   Title Decrease R shoulder pain to consistently 0-3/10 on the Numeric Pain Rating Scale.    Baseline Can be 7/10 and limit sleep    Time 8    Period Weeks    Status On-going    Target Date 07/03/21      PT LONG TERM GOAL #3   Title Improve R shoulder strength to 100% of the uninvolved L.    Baseline Met (was < 60% at evaluation).    Time 8    Period Weeks    Status Achieved      PT LONG TERM GOAL #4   Title Kennya will be independent with her long-term maintenence  HEP at DC.    Baseline Prescribed today    Time 8    Period Weeks    Status Achieved                   Plan - 06/07/21 1408     Clinical Impression Statement Dixie has made excellent progress with her R shoulder AROM and strength.  AROM is 97% of her long-term goals and strength has doubled since evaluation (only 7  visits).  Pain and diffiulty sleeping have not made significant progress.  Revia would like to continue her exercises independently until she follows-up with Dr. Sharol Given and weighs TSA vs her other options.  Her HEP was updated today for long-term use.    Personal Factors and Comorbidities Comorbidity 3+    Comorbidities HTN, CAD, Angina, previous angioplasty with stent, smoker, previous L knee and R shoulder (RTC repair) surgery    Examination-Activity Limitations Dressing;Sleep;Bed Mobility;Lift;Carry;Reach Overhead    Examination-Participation Restrictions Community Activity    Stability/Clinical Decision Making Stable/Uncomplicated    Rehab Potential Good    PT Frequency Other (comment)    PT Duration Other (comment)   DC   PT Treatment/Interventions ADLs/Self Care Home  Management;Moist Heat;Cryotherapy;Therapeutic activities;Therapeutic exercise;Neuromuscular re-education;Patient/family education;Manual techniques;Passive range of motion    PT Next Visit Plan DC today    PT Home Exercise Plan Access Code: [email or web address]    Consulted and Agree with Plan of Care Patient             Patient will benefit from skilled therapeutic intervention in order to improve the following deficits and impairments:  Decreased range of motion, Decreased strength, Increased edema, Impaired flexibility, Impaired UE functional use, Pain  Visit Diagnosis: Muscle weakness (generalized)  Stiffness of right shoulder, not elsewhere classified  Chronic right shoulder pain  Localized edema     Problem List Patient Active Problem List   Diagnosis Date Noted   Variant angina ([place]) 01/24/2017   Chest pain 01/23/2017   Headache 01/23/2017   S/P angioplasty with stent 01/23/2017   Coronary artery disease 11/24/2014   Syncope 10/22/2014   Hyperglycemia 04/20/2014   H/O hiatal hernia 04/19/2014   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

## 2021-06-10 ENCOUNTER — Ambulatory Visit
Admission: RE | Admit: 2021-06-10 | Discharge: 2021-06-10 | Disposition: A | Discharge status: Home / Self Care | Payer: Medicare Other | Source: Ambulatory Visit | Attending: Gastroenterology | Admitting: Gastroenterology

## 2021-06-10 DIAGNOSIS — Z9049 Acquired absence of other specified parts of digestive tract: Secondary | ICD-10-CM | POA: Diagnosis not present

## 2021-06-10 DIAGNOSIS — R109 Unspecified abdominal pain: Secondary | ICD-10-CM | POA: Diagnosis not present

## 2021-06-10 DIAGNOSIS — E118 Type 2 diabetes mellitus with unspecified complications: Secondary | ICD-10-CM | POA: Diagnosis not present

## 2021-06-10 DIAGNOSIS — K760 Fatty (change of) liver, not elsewhere classified: Secondary | ICD-10-CM | POA: Diagnosis not present

## 2021-06-17 ENCOUNTER — Ambulatory Visit: Payer: Self-pay

## 2021-06-17 ENCOUNTER — Ambulatory Visit: Payer: Medicare Other | Admitting: Orthopedic Surgery

## 2021-06-17 ENCOUNTER — Encounter: Payer: Self-pay | Admitting: Orthopedic Surgery

## 2021-06-17 DIAGNOSIS — M25512 Pain in left shoulder: Secondary | ICD-10-CM | POA: Diagnosis not present

## 2021-06-17 MED ORDER — METHYLPREDNISOLONE ACETATE 40 MG/ML IJ SUSP
40.0000 mg | INTRAMUSCULAR | Status: AC | PRN
  Administered 2021-06-17: 40 mg via INTRA_ARTICULAR

## 2021-06-17 MED ORDER — LIDOCAINE HCL 1 % IJ SOLN
5.0000 mL | INTRAMUSCULAR | Status: AC | PRN
  Administered 2021-06-17: 5 mL

## 2021-06-17 NOTE — Progress Notes (Signed)
Office Visit Note   Patient: [name]           Date of Birth: 21-Mar-1946           MRN: [id number] Visit Date: 06/17/2021              Requested by: [name], MD [facility] [employer] [address],  [place] [address] PCP: [name], MD  Chief Complaint  Patient presents with   Right Shoulder - Pain, Follow-up      HPI: Patient is a 75 year old woman who has had chronic symptoms with a right shoulder and most recently has completed physical therapy.  She states she is now started developing left shoulder pain after therapy.  She has had 3 steroid injections in the right shoulder.  Patient states that therapy was recommending a gel injection for her shoulder.  Assessment & Plan: Visit Diagnoses:  1. Acute pain of left shoulder     Plan: Left shoulder was injected the subacromial space she will continue with her strengthening exercises.  Follow-Up Instructions: Return in about 3 weeks (around 07/08/2021).   Ortho Exam  Patient is alert, oriented, no adenopathy, well-dressed, normal affect, normal respiratory effort. Examination patient has abduction and flexion of the left shoulder to 100 degrees she has pain with Neer and Hawkins impingement test the [facility] joint is not tender to palpation.  No adhesive capsulitis.  Imaging: XR Shoulder Left  Result Date: 06/17/2021 2 view radiographs of the left shoulder shows superior migration of the humeral head within the glenoid.  Decreased subacromial joint space.  No images are attached to the encounter.  Labs: Lab Results  Component Value Date   HGBA1C 6.4 (H) 10/01/2017   HGBA1C 6.7 (H) 07/29/2017   HGBA1C 6.3 (H) 04/19/2014   ESRSEDRATE 9 08/29/2007   REPTSTATUS 10/31/2020 FINAL 10/29/2020   CULT MULTIPLE SPECIES PRESENT, SUGGEST RECOLLECTION (A) 10/29/2020     Lab Results  Component Value Date   ALBUMIN 3.8 10/29/2020   ALBUMIN 3.2 (L) 02/04/2016   ALBUMIN 2.8 (L) 04/19/2014    Lab Results   Component Value Date   MG 2.0 01/24/2017   MG 2.1 09/21/2013   MG 2.0 03/14/2007   No results found for: VD25OH  No results found for: PREALBUMIN CBC EXTENDED Latest Ref Rng & Units 10/29/2020 05/15/2019 10/01/2017  WBC 4.0 - 10.5 K/uL 4.9 5.8 5.3  RBC 3.87 - 5.11 MIL/uL 4.46 3.77(L) 4.52  HGB 12.0 - 15.0 g/dL 12.3 10.2(L) 13.4  HCT 36.0 - 46.0 % 38.4 32.2(L) 40.4  PLT 150 - 400 K/uL 290 334 294  NEUTROABS 1.7 - 7.7 K/uL - - -  LYMPHSABS 0.7 - 4.0 K/uL - - -     There is no height or weight on file to calculate BMI.  Orders:  Orders Placed This Encounter  Procedures   XR Shoulder Left   No orders of the defined types were placed in this encounter.    Procedures: Large Joint Inj: L subacromial bursa on 06/17/2021 12:27 PM Indications: diagnostic evaluation and pain Details: 22 G 1.5 in needle, posterior approach  Arthrogram: No  Medications: 5 mL lidocaine 1 %; 40 mg methylPREDNISolone acetate 40 MG/ML Outcome: tolerated well, no immediate complications Procedure, treatment alternatives, risks and benefits explained, specific risks discussed. Consent was given by the patient. Immediately prior to procedure a time out was called to verify the correct patient, procedure, equipment, support staff and site/side marked as required. Patient was prepped and draped  in the usual sterile fashion.     Clinical Data: No additional findings.  ROS:  All other systems negative, except as noted in the HPI. Review of Systems  Objective: Vital Signs: There were no vitals taken for this visit.  Specialty Comments:  No specialty comments available.  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  09/26/2013   Coronary vasospasm ([facility])    Diabetes mellitus without complication ([facility])    type II   GERD (gastroesophageal reflux disease)    Headache    Hiatal hernia    Hypercholesteremia    Hypertension    NSTEMI (non-ST elevated myocardial infarction) ([place]) not sure   NSVT (nonsustained ventricular tachycardia) 09/26/2013   Panic attack     Family History  Problem Relation Age of Onset   Heart failure Mother    Stroke Sister    Heart attack Other     Past Surgical History:  Procedure Laterality Date   ABDOMINAL HYSTERECTOMY     PARTIAL HYSTERECTOMY   breast     right, tumor benign   BREAST EXCISIONAL BIOPSY Right    3 (age 75) benign   CARDIAC CATHETERIZATION  2014   CARDIAC CATHETERIZATION  2012   CARDIAC CATHETERIZATION N/A 07/22/2016   Procedure: Left Heart Cath and Coronary Angiography;  Surgeon: Belva Crome, MD;  Location: [place] [facility];  Service: Cardiovascular;  Laterality: N/A;   cardiac stent  2012   to RCA   CHOLECYSTECTOMY N/A 10/02/2017   Procedure: LAPAROSCOPIC CHOLECYSTECTOMY;  Surgeon: Ralene Ok, MD;  Location: [place];  Service: General;  Laterality: N/A;   CORONARY ANGIOPLASTY     ESOPHAGEAL MANOMETRY N/A 10/29/2016   Procedure: ESOPHAGEAL MANOMETRY (EM);  Surgeon: Garlan Fair, MD;  [facility];  Service: Endoscopy;  Laterality: N/A;   ESOPHAGOGASTRODUODENOSCOPY (EGD) WITH PROPOFOL N/A 10/28/2016   Procedure: ESOPHAGOGASTRODUODENOSCOPY (EGD) WITH PROPOFOL;  Surgeon: Garlan Fair, MD;  Location:  [facility];  Service: Endoscopy;  Laterality: N/A;   EXCISION OF SKIN TAG N/A 07/29/2017   Procedure: EXCISION OF PERIANAL  SKIN TAG;  Surgeon: Ralene Ok, MD;  Location: [place];  Service: General;  Laterality: N/A;   KNEE SURGERY  left   left ear surgery     for bad cut   LEFT HEART CATHETERIZATION WITH CORONARY ANGIOGRAM N/A 02/22/2013   Procedure: LEFT HEART CATHETERIZATION WITH CORONARY ANGIOGRAM;  Surgeon: [name], MD;  Location: [facility];  Service: Cardiovascular;  Laterality: N/A;   SHOULDER SURGERY Bilateral    rotator cuff   Social History   Occupational History   Not on file  Tobacco Use   Smoking status: Some Days  Packs/day: 0.50    Years: 40.00    Pack years: 20.00    Types: Cigarettes    Last attempt to quit: 01/10/2016    Years since quitting: 5.4   Smokeless tobacco: Never   Tobacco comments:    occasional   Vaping Use   Vaping Use: Never used  Substance and Sexual Activity   Alcohol use: Yes    Alcohol/week: 1.0 standard drink    Types: 1 Cans of beer per week    Comment: occasional beer   Drug use: No   Sexual activity: Never

## 2021-06-18 ENCOUNTER — Other Ambulatory Visit: Payer: Self-pay | Admitting: Gastroenterology

## 2021-06-18 DIAGNOSIS — R109 Unspecified abdominal pain: Secondary | ICD-10-CM

## 2021-06-21 ENCOUNTER — Observation Stay (HOSPITAL_COMMUNITY)
Admission: EM | Admit: 2021-06-21 | Discharge: 2021-06-22 | Disposition: A | Discharge status: Home / Self Care | Payer: Medicare Other | Attending: Cardiovascular Disease | Admitting: Cardiovascular Disease

## 2021-06-21 ENCOUNTER — Emergency Department (HOSPITAL_COMMUNITY): Payer: Medicare Other

## 2021-06-21 ENCOUNTER — Other Ambulatory Visit: Payer: Self-pay

## 2021-06-21 DIAGNOSIS — F17210 Nicotine dependence, cigarettes, uncomplicated: Secondary | ICD-10-CM | POA: Insufficient documentation

## 2021-06-21 DIAGNOSIS — Z79899 Other long term (current) drug therapy: Secondary | ICD-10-CM | POA: Diagnosis not present

## 2021-06-21 DIAGNOSIS — I10 Essential (primary) hypertension: Secondary | ICD-10-CM | POA: Diagnosis present

## 2021-06-21 DIAGNOSIS — R079 Chest pain, unspecified: Secondary | ICD-10-CM | POA: Diagnosis not present

## 2021-06-21 DIAGNOSIS — Z7984 Long term (current) use of oral hypoglycemic drugs: Secondary | ICD-10-CM | POA: Diagnosis not present

## 2021-06-21 DIAGNOSIS — Z7982 Long term (current) use of aspirin: Secondary | ICD-10-CM | POA: Diagnosis not present

## 2021-06-21 DIAGNOSIS — K219 Gastro-esophageal reflux disease without esophagitis: Secondary | ICD-10-CM | POA: Diagnosis present

## 2021-06-21 DIAGNOSIS — E119 Type 2 diabetes mellitus without complications: Secondary | ICD-10-CM | POA: Diagnosis not present

## 2021-06-21 DIAGNOSIS — R0789 Other chest pain: Secondary | ICD-10-CM | POA: Diagnosis not present

## 2021-06-21 DIAGNOSIS — R072 Precordial pain: Principal | ICD-10-CM

## 2021-06-21 DIAGNOSIS — Z20822 Contact with and (suspected) exposure to covid-19: Secondary | ICD-10-CM | POA: Diagnosis not present

## 2021-06-21 DIAGNOSIS — I201 Angina pectoris with documented spasm: Secondary | ICD-10-CM | POA: Diagnosis present

## 2021-06-21 DIAGNOSIS — Z955 Presence of coronary angioplasty implant and graft: Secondary | ICD-10-CM | POA: Insufficient documentation

## 2021-06-21 DIAGNOSIS — I2510 Atherosclerotic heart disease of native coronary artery without angina pectoris: Secondary | ICD-10-CM | POA: Insufficient documentation

## 2021-06-21 DIAGNOSIS — E785 Hyperlipidemia, unspecified: Secondary | ICD-10-CM | POA: Diagnosis present

## 2021-06-21 LAB — BASIC METABOLIC PANEL
Anion gap: 8 (ref 5–15)
BUN: 9 mg/dL (ref 8–23)
CO2: 25 mmol/L (ref 22–32)
Calcium: 9.6 mg/dL (ref 8.9–10.3)
Chloride: 110 mmol/L (ref 98–111)
Creatinine, Ser: 1.13 mg/dL — ABNORMAL HIGH (ref 0.44–1.00)
GFR, Estimated: 51 mL/min — ABNORMAL LOW (ref >60)
Glucose, Bld: 121 mg/dL — ABNORMAL HIGH (ref 70–99)
Potassium: 4.1 mmol/L (ref 3.5–5.1)
Sodium: 143 mmol/L (ref 135–145)

## 2021-06-21 LAB — CBC
HCT: 42.9 % (ref 36.0–46.0)
HCT: 41.2 % (ref 36.0–46.0)
Hemoglobin: 14.4 g/dL (ref 12.0–15.0)
Hemoglobin: 13.6 g/dL (ref 12.0–15.0)
MCH: 31.5 pg (ref 26.0–34.0)
MCH: 31.1 pg (ref 26.0–34.0)
MCHC: 33.6 g/dL (ref 30.0–36.0)
MCHC: 33 g/dL (ref 30.0–36.0)
MCV: 93.9 fL (ref 80.0–100.0)
MCV: 94.3 fL (ref 80.0–100.0)
Platelets: 271 10*3/uL (ref 150–400)
Platelets: 252 10*3/uL (ref 150–400)
RBC: 4.57 MIL/uL (ref 3.87–5.11)
RBC: 4.37 MIL/uL (ref 3.87–5.11)
RDW: 14 % (ref 11.5–15.5)
RDW: 13.9 % (ref 11.5–15.5)
WBC: 5.8 10*3/uL (ref 4.0–10.5)
WBC: 6.1 10*3/uL (ref 4.0–10.5)
nRBC: 0 % (ref 0.0–0.2)
nRBC: 0 % (ref 0.0–0.2)

## 2021-06-21 LAB — HEPATIC FUNCTION PANEL
ALT: 12 U/L (ref 0–44)
AST: 11 U/L — ABNORMAL LOW (ref 15–41)
Albumin: 3.1 g/dL — ABNORMAL LOW (ref 3.5–5.0)
Alkaline Phosphatase: 73 U/L (ref 38–126)
Bilirubin, Direct: <0.1 mg/dL (ref 0.0–0.2)
Total Bilirubin: 0.7 mg/dL (ref 0.3–1.2)
Total Protein: 5.7 g/dL — ABNORMAL LOW (ref 6.5–8.1)

## 2021-06-21 LAB — TROPONIN I (HIGH SENSITIVITY)
Troponin I (High Sensitivity): 5 ng/L (ref <18)
Troponin I (High Sensitivity): 5 ng/L (ref <18)

## 2021-06-21 LAB — BRAIN NATRIURETIC PEPTIDE: B Natriuretic Peptide: 30 pg/mL (ref 0.0–100.0)

## 2021-06-21 LAB — TSH: TSH: 1.952 u[IU]/mL (ref 0.350–4.500)

## 2021-06-21 LAB — HEMOGLOBIN A1C
Hgb A1c MFr Bld: 6.6 % — ABNORMAL HIGH (ref 4.8–5.6)
Mean Plasma Glucose: 142.72 mg/dL

## 2021-06-21 LAB — GLUCOSE, CAPILLARY: Glucose-Capillary: 107 mg/dL — ABNORMAL HIGH (ref 70–99)

## 2021-06-21 LAB — MAGNESIUM: Magnesium: 1.6 mg/dL — ABNORMAL LOW (ref 1.7–2.4)

## 2021-06-21 LAB — CREATININE, SERUM
Creatinine, Ser: 0.91 mg/dL (ref 0.44–1.00)
GFR, Estimated: >60 mL/min (ref >60)

## 2021-06-21 LAB — RESP PANEL BY RT-PCR (FLU A&B, COVID) ARPGX2
Influenza A by PCR: NEGATIVE
Influenza B by PCR: NEGATIVE
SARS Coronavirus 2 by RT PCR: NEGATIVE

## 2021-06-21 LAB — LIPASE, BLOOD: Lipase: 36 U/L (ref 11–51)

## 2021-06-21 MED ORDER — ACETAMINOPHEN 325 MG PO TABS: 650.0000 mg | ORAL_TABLET | ORAL | Status: DC | PRN

## 2021-06-21 MED ORDER — NITROGLYCERIN 0.4 MG SL SUBL: 0.4000 mg | SUBLINGUAL_TABLET | SUBLINGUAL | Status: DC | PRN

## 2021-06-21 MED ORDER — MORPHINE SULFATE (PF) 2 MG/ML IV SOLN
2.0000 mg | Freq: Once | INTRAVENOUS | Status: AC
  Administered 2021-06-21: 2 mg via INTRAVENOUS
  Filled 2021-06-21: qty 1

## 2021-06-21 MED ORDER — BUPROPION HCL ER (SR) 150 MG PO TB12: 150.0000 mg | ORAL_TABLET | Freq: Every day | ORAL | Status: DC

## 2021-06-21 MED ORDER — ROSUVASTATIN CALCIUM 5 MG PO TABS
5.0000 mg | ORAL_TABLET | Freq: Every day | ORAL | Status: DC
  Administered 2021-06-21: 5 mg via ORAL
  Filled 2021-06-21: qty 1

## 2021-06-21 MED ORDER — ASPIRIN EC 81 MG PO TBEC: 81.0000 mg | DELAYED_RELEASE_TABLET | Freq: Every day | ORAL | Status: DC

## 2021-06-21 MED ORDER — NITROGLYCERIN 0.4 MG SL SUBL
0.4000 mg | SUBLINGUAL_TABLET | SUBLINGUAL | Status: DC | PRN
  Administered 2021-06-21 (×2): 0.4 mg via SUBLINGUAL
  Filled 2021-06-21 (×2): qty 1

## 2021-06-21 MED ORDER — ALUM & MAG HYDROXIDE-SIMETH 200-200-20 MG/5ML PO SUSP
30.0000 mL | Freq: Once | ORAL | Status: AC
  Administered 2021-06-21: 30 mL via ORAL
  Filled 2021-06-21: qty 30

## 2021-06-21 MED ORDER — RANOLAZINE ER 500 MG PO TB12
500.0000 mg | ORAL_TABLET | Freq: Two times a day (BID) | ORAL | Status: DC
  Administered 2021-06-21 – 2021-06-22 (×2): 500 mg via ORAL
  Filled 2021-06-21 (×2): qty 1

## 2021-06-21 MED ORDER — ASPIRIN 325 MG PO TABS
325.0000 mg | ORAL_TABLET | Freq: Every day | ORAL | Status: DC
  Administered 2021-06-21 – 2021-06-22 (×2): 325 mg via ORAL
  Filled 2021-06-21 (×2): qty 1

## 2021-06-21 MED ORDER — TEMAZEPAM 15 MG PO CAPS
15.0000 mg | ORAL_CAPSULE | Freq: Every evening | ORAL | Status: DC | PRN
  Administered 2021-06-21: 15 mg via ORAL
  Filled 2021-06-21: qty 1

## 2021-06-21 MED ORDER — HEPARIN SODIUM (PORCINE) 5000 UNIT/ML IJ SOLN
5000.0000 [IU] | Freq: Three times a day (TID) | INTRAMUSCULAR | Status: DC
  Administered 2021-06-21 – 2021-06-22 (×2): 5000 [IU] via SUBCUTANEOUS
  Filled 2021-06-21 (×2): qty 1

## 2021-06-21 MED ORDER — ISOSORBIDE MONONITRATE ER 60 MG PO TB24
90.0000 mg | ORAL_TABLET | Freq: Every day | ORAL | Status: DC
  Administered 2021-06-22: 90 mg via ORAL
  Filled 2021-06-21: qty 1

## 2021-06-21 MED ORDER — ONDANSETRON HCL 4 MG/2ML IJ SOLN: 4.0000 mg | Freq: Four times a day (QID) | INTRAMUSCULAR | Status: DC | PRN

## 2021-06-21 MED ORDER — TRAMADOL HCL 50 MG PO TABS
50.0000 mg | ORAL_TABLET | Freq: Four times a day (QID) | ORAL | Status: DC | PRN
  Administered 2021-06-21: 50 mg via ORAL
  Filled 2021-06-21: qty 1

## 2021-06-21 MED ORDER — NITROGLYCERIN 0.4 MG SL SUBL
0.4000 mg | SUBLINGUAL_TABLET | SUBLINGUAL | Status: DC | PRN
  Administered 2021-06-22: 0.4 mg via SUBLINGUAL

## 2021-06-21 MED ORDER — OXYCODONE-ACETAMINOPHEN 5-325 MG PO TABS: 1.0000 | ORAL_TABLET | Freq: Three times a day (TID) | ORAL | Status: DC | PRN

## 2021-06-21 MED ORDER — ASPIRIN 300 MG RE SUPP
300.0000 mg | RECTAL | Status: AC
  Filled 2021-06-21: qty 1

## 2021-06-21 MED ORDER — ASPIRIN 81 MG PO CHEW
324.0000 mg | CHEWABLE_TABLET | ORAL | Status: AC
  Administered 2021-06-21: 324 mg via ORAL
  Filled 2021-06-21: qty 4

## 2021-06-21 MED ORDER — AMLODIPINE BESYLATE 2.5 MG PO TABS
2.5000 mg | ORAL_TABLET | Freq: Every day | ORAL | Status: DC
  Administered 2021-06-22: 2.5 mg via ORAL
  Filled 2021-06-21: qty 1

## 2021-06-21 MED ORDER — PANTOPRAZOLE SODIUM 40 MG PO TBEC
40.0000 mg | DELAYED_RELEASE_TABLET | Freq: Two times a day (BID) | ORAL | Status: DC
  Administered 2021-06-21 – 2021-06-22 (×2): 40 mg via ORAL
  Filled 2021-06-21 (×2): qty 1

## 2021-06-21 NOTE — ED Triage Notes (Signed)
C/o unresolved chest pain started the beginning of the week, comes and go in nature.

## 2021-06-21 NOTE — ED Triage Notes (Signed)
Patient stated taken NTG x 3, no relief,

## 2021-06-21 NOTE — ED Provider Notes (Signed)
[facility] EMERGENCY DEPARTMENT Provider Note   CSN: [id number] Arrival date & time: 06/21/21  1149     History Chief Complaint  Patient presents with   Chest Pain    [name] is a 75 y.o. female.   Chest Pain Associated symptoms: diaphoresis   Associated symptoms: no abdominal pain, no back pain, no cough, no dizziness, no fever, no headache, no nausea, no numbness, no palpitations, no shortness of breath, no vomiting and no weakness    75 year old female with a PMH significant for CAD with prior stents, NSTEMI, and SVT, type II DM, HTN, HLD, and others as below who presents to the ED with chest pain.  Patient reports that she has had intermittent chest pain, starting at the beginning of this week.  It is midsternal, nonradiating, and associated with diaphoresis and sensation of heartburn.  It feels similar to her prior heart attack.  It goes away on its own and is worsened by lying flat.  Episodes really last about 10 minutes, and have not been relieved by home nitro.  She is a current smoker.  Denies any fevers or chills, shortness of breath, coughing, abdominal pain or vomiting, dizziness or lightheadedness, syncope, weakness, peripheral edema, or any other complaints.  She currently follows with her cardiologist.  No known history of blood clots and does not use a blood thinner. Does not undergo HD.  Per review of cardiology records, patient last underwent LHC in 2017, notable for EF of 45 to 50% with LAD and RCA lesions. Last Echo in 202, LVEF 45-50%.  Past Medical History:  Diagnosis Date   Anemia    Anginal pain ([place])    last cp was last year   Anxiety    Arthritis    Blood transfusion    Chest pain 02/04/2016   Coronary artery disease    Coronary artery spasm, wth continued episodes of chest pain.  09/26/2013   Coronary vasospasm (HCC)    Diabetes mellitus without complication (HCC)    type II   GERD (gastroesophageal reflux disease)     Headache    Hiatal hernia    Hypercholesteremia    Hypertension    NSTEMI (non-ST elevated myocardial infarction) ([place]) not sure   NSVT (nonsustained ventricular tachycardia) 09/26/2013   Panic attack     Patient Active Problem List   Diagnosis Date Noted   Chest pain 06/21/2021   Variant angina ([place]) 01/24/2017   Precordial chest pain 01/23/2017   Headache 01/23/2017   S/P angioplasty with stent 01/23/2017   Coronary artery disease 11/24/2014   Syncope 10/22/2014   Hyperglycemia 04/20/2014   H/O hiatal hernia 04/19/2014   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    Coronary artery vasospasm ([place]) 09/26/2013   HTN (hypertension) 12/08/2011   GERD (gastroesophageal reflux disease) 12/08/2011   Depression 12/08/2011   Dyslipidemia 12/08/2011    Past Surgical History:  Procedure Laterality Date   ABDOMINAL HYSTERECTOMY     PARTIAL HYSTERECTOMY   breast     right, tumor benign   BREAST EXCISIONAL BIOPSY Right    1961 (age 15) benign   CARDIAC CATHETERIZATION  2014   CARDIAC CATHETERIZATION  2012   CARDIAC CATHETERIZATION N/A 07/22/2016   Procedure: Left Heart Cath and Coronary Angiography;  Surgeon: Belva Crome, MD;  Location: [place] [facility];  Service: Cardiovascular;  Laterality: N/A;   cardiac stent  2012   to RCA   CHOLECYSTECTOMY N/A 10/02/2017   Procedure: LAPAROSCOPIC CHOLECYSTECTOMY;  Surgeon: Rosendo Gros,  Anne Hahn, MD;  Location: [place];  Service: General;  Laterality: N/A;   CORONARY ANGIOPLASTY     ESOPHAGEAL MANOMETRY N/A 10/29/2016   Procedure: ESOPHAGEAL MANOMETRY (EM);  Surgeon: Garlan Fair, MD;  Location: [facility];  Service: Endoscopy;  Laterality: N/A;   ESOPHAGOGASTRODUODENOSCOPY (EGD) WITH PROPOFOL N/A 10/28/2016   Procedure: ESOPHAGOGASTRODUODENOSCOPY (EGD) WITH PROPOFOL;  Surgeon: Garlan Fair, MD;  Location: [facility];  Service: Endoscopy;  Laterality: N/A;   EXCISION OF SKIN TAG N/A 07/29/2017   Procedure: EXCISION OF PERIANAL  SKIN TAG;  Surgeon: Ralene Ok, MD;  Location: [place];  Service: General;  Laterality: N/A;   KNEE SURGERY  left   left ear surgery     for bad cut   LEFT HEART CATHETERIZATION WITH CORONARY ANGIOGRAM N/A 02/22/2013   Procedure: LEFT HEART CATHETERIZATION WITH CORONARY ANGIOGRAM;  Surgeon: [name], MD;  Location: [facility] CATH LAB;  Service: Cardiovascular;  Laterality: N/A;   SHOULDER SURGERY Bilateral    rotator cuff     OB History   No obstetric history on file.     Family History  Problem Relation Age of Onset   Heart failure Mother    Stroke Sister    Heart attack Other     Social History   Tobacco Use   Smoking status: Some Days    Packs/day: 0.50    Years: 40.00    Pack years: 20.00    Types: Cigarettes    Last attempt to quit: 01/10/2016    Years since quitting: 5.4   Smokeless tobacco: Never   Tobacco comments:    occasional   Vaping Use   Vaping Use: Never used  Substance Use Topics   Alcohol use: Yes    Alcohol/week: 1.0 standard drink    Types: 1 Cans of beer per week    Comment: occasional beer   Drug use: No    Home Medications Prior to Admission medications   Medication Sig Start Date End Date Taking? Authorizing Provider  acetaminophen (TYLENOL) 500 MG tablet Take 500 mg by mouth daily as needed for moderate pain or headache.   Yes [provider]  amLODipine (NORVASC) 2.5 MG tablet Take 1 tablet (2.5 mg total) by mouth daily. 01/25/17  Yes Eugenie Filler, MD  aspirin EC 81 MG tablet Take 81 mg by mouth daily.   Yes [provider]  buPROPion (ZYBAN) 150 MG 12 hr tablet Take 150 mg by mouth every morning. 06/25/20  Yes [provider]  isosorbide mononitrate (IMDUR) 60 MG 24 hr tablet TAKE 1 AND 1/2 TABLETS BY MOUTH DAILY. NEED TO SCHEDULE YEARLY APPOINTMENT FOR FURTHER REFILLS Patient taking differently: Take 90 mg by mouth every evening. 07/05/18  Yes Belva Crome, MD  metFORMIN (GLUCOPHAGE-XR) 500 MG 24 hr tablet Take 500 mg by mouth  2 (two) times daily. 06/28/20  Yes [provider]  nitroGLYCERIN (NITROSTAT) 0.4 MG SL tablet PLACE 1 TABLET UNDER TONGUE AS NEEDED FOR CHEST PAIN EVERY 5 MINUTES UP TO 3 DOSES THEN SEEK MEDICAL ATTENTION. Please make overdue appt. 1st attempt Patient taking differently: Place 0.4 mg under the tongue every 5 (five) minutes as needed for chest pain. 02/18/19  Yes Belva Crome, MD  pantoprazole (PROTONIX) 40 MG tablet Take 1 tablet (40 mg total) by mouth 2 (two) times daily. 04/20/14  Yes Isaiah Serge, NP  ranitidine (ZANTAC) 150 MG tablet Take 150 mg by mouth 2 (two) times daily.  Yes [provider]  ranolazine (RANEXA) 500 MG 12 hr tablet TAKE 1 TABLET(500 MG) BY MOUTH TWICE DAILY Patient taking differently: Take 500 mg by mouth 2 (two) times daily. 02/13/21  Yes Belva Crome, MD  rosuvastatin (CRESTOR) 5 MG tablet Take 1 tablet (5 mg total) by mouth daily at 6 PM. Patient taking differently: Take 5 mg by mouth daily. 01/24/17  Yes Eugenie Filler, MD  senna-docusate (SENOKOT-S) 8.6-50 MG tablet Take 1 tablet by mouth at bedtime as needed for mild constipation or moderate constipation. Patient taking differently: Take 1 tablet by mouth daily as needed for mild constipation or moderate constipation. 10/29/20  Yes Long, Wonda Olds, MD  temazepam (RESTORIL) 15 MG capsule Take 15 mg by mouth daily as needed for sleep. 10/02/20  Yes [provider]  ACCU-CHEK GUIDE test strip USE TO CHECK BLOOD SUGAR DAILY 11/05/20   [provider]  Accu-Chek Softclix Lancets lancets daily. 11/05/20   [provider]  ondansetron (ZOFRAN ODT) 4 MG disintegrating tablet Take 1 tablet (4 mg total) by mouth every 8 (eight) hours as needed for nausea or vomiting. Patient not taking: No sig reported 10/29/20   Long, Wonda Olds, MD  oxyCODONE-acetaminophen (PERCOCET/ROXICET) 5-325 MG tablet Take 1 tablet by mouth every 8 (eight) hours as needed for severe pain. Patient not taking:  Reported on 06/21/2021 05/20/21   Newt Minion, MD  predniSONE (DELTASONE) 50 MG tablet Take one tablet by mouth once daily for 5 days. Patient not taking: Reported on 06/21/2021 03/06/21   Suzan Slick, NP  pregabalin (LYRICA) 25 MG capsule Take 25 mg by mouth 2 (two) times daily. Patient not taking: Reported on 06/21/2021 06/03/20   [provider]    Allergies    Patient has no known allergies.  Review of Systems   Review of Systems  Constitutional:  Positive for diaphoresis. Negative for activity change, appetite change, chills and fever.  HENT:  Negative for ear pain and sore throat.   Eyes:  Negative for pain and visual disturbance.  Respiratory:  Positive for chest tightness. Negative for cough and shortness of breath.   Cardiovascular:  Positive for chest pain. Negative for palpitations and leg swelling.  Gastrointestinal:  Negative for abdominal pain, constipation, diarrhea, nausea and vomiting.  Genitourinary:  Negative for dysuria and hematuria.  Musculoskeletal:  Negative for arthralgias, back pain, neck pain and neck stiffness.  Skin:  Negative for color change, pallor and rash.  Neurological:  Negative for dizziness, seizures, syncope, weakness, light-headedness, numbness and headaches.  Hematological:  Does not bruise/bleed easily.  Psychiatric/Behavioral:  Negative for confusion. The patient is not nervous/anxious.   All other systems reviewed and are negative.  Physical Exam Updated Vital Signs BP 112/60   Pulse (!) 58   Temp 98.2 F (36.8 C) (Oral)   Resp 15   Ht 4\' 11"  (1.499 m)   Wt 52 kg   SpO2 100%   BMI 23.17 kg/m   Physical Exam Vitals and nursing note reviewed.  Constitutional:      General: She is not in acute distress.    Appearance: She is well-developed and normal weight. She is not ill-appearing or diaphoretic.  HENT:     Head: Normocephalic and atraumatic.     Right Ear: External ear normal.     Left Ear: External ear normal.      Nose: Nose normal.     Mouth/Throat:     Mouth: Mucous membranes are moist.  Pharynx: Oropharynx is clear.  Eyes:     General: No scleral icterus.    Extraocular Movements: Extraocular movements intact.     Conjunctiva/sclera: Conjunctivae normal.     Pupils: Pupils are equal, round, and reactive to light.  Neck:     Vascular: No JVD.  Cardiovascular:     Rate and Rhythm: Normal rate and regular rhythm.     Pulses: Normal pulses.     Heart sounds: Normal heart sounds. No murmur heard.    Comments: Warm and well perfused Pulmonary:     Effort: Pulmonary effort is normal. No tachypnea or respiratory distress.     Breath sounds: Normal breath sounds and air entry.  Abdominal:     General: There is no distension.     Palpations: Abdomen is soft.     Tenderness: There is no abdominal tenderness. There is no guarding or rebound.  Musculoskeletal:        General: Normal range of motion.     Cervical back: Normal range of motion and neck supple. No rigidity or tenderness.     Right lower leg: No edema.     Left lower leg: No edema.  Lymphadenopathy:     Cervical: No cervical adenopathy.  Skin:    General: Skin is warm and dry.     Capillary Refill: Capillary refill takes less than 2 seconds.  Neurological:     General: No focal deficit present.     Mental Status: She is alert and oriented to person, place, and time. Mental status is at baseline.     GCS: GCS eye subscore is 4. GCS verbal subscore is 5. GCS motor subscore is 6.  Psychiatric:        Mood and Affect: Mood normal.        Behavior: Behavior normal.    ED Results / Procedures / Treatments   Labs (all labs ordered are listed, but only abnormal results are displayed) Labs Reviewed  BASIC METABOLIC PANEL - Abnormal; Notable for the following components:      Result Value   Glucose, Bld 121 (*)    Creatinine, Ser 1.13 (*)    GFR, Estimated 51 (*)    All other components within normal limits  MAGNESIUM -  Abnormal; Notable for the following components:   Magnesium 1.6 (*)    All other components within normal limits  HEPATIC FUNCTION PANEL - Abnormal; Notable for the following components:   Total Protein 5.7 (*)    Albumin 3.1 (*)    AST 11 (*)    All other components within normal limits  HEMOGLOBIN A1C - Abnormal; Notable for the following components:   Hgb A1c MFr Bld 6.6 (*)    All other components within normal limits  GLUCOSE, CAPILLARY - Abnormal; Notable for the following components:   Glucose-Capillary 107 (*)    All other components within normal limits  RESP PANEL BY RT-PCR (FLU A&B, COVID) ARPGX2  CBC  BRAIN NATRIURETIC PEPTIDE  LIPASE, BLOOD  CBC  CREATININE, SERUM  TSH  BASIC METABOLIC PANEL  LIPID PANEL  CBC  TROPONIN I (HIGH SENSITIVITY)  TROPONIN I (HIGH SENSITIVITY)    EKG EKG Interpretation  Date/Time:  Friday June 21 2021 11:57:48 EST Ventricular Rate:  78 PR Interval:  146 QRS Duration: 86 QT Interval:  394 QTC Calculation: 449 R Axis:   -10 Text Interpretation: Normal sinus rhythm Low voltage QRS Nonspecific T wave abnormality Abnormal ECG When compared to prior, similar appearance  to prior. No STEMI Confirmed by Antony Blackbird [phone]) on 06/21/2021 3:12:58 PM  Radiology DG Chest 2 View  Result Date: 06/21/2021 CLINICAL DATA:  Chest pain EXAM: CHEST - 2 VIEW COMPARISON:  Chest x-ray 01/23/2017 FINDINGS: Heart size and mediastinal contours are within normal limits. No suspicious pulmonary opacities identified. No pleural effusion or pneumothorax visualized. No acute osseous abnormality appreciated. IMPRESSION: No acute intrathoracic process identified. Electronically Signed   By: Ofilia Neas M.D.   On: 06/21/2021 12:13    Procedures Procedures   Medications Ordered in ED Medications  nitroGLYCERIN (NITROSTAT) SL tablet 0.4 mg (0.4 mg Sublingual Given 06/21/21 1819)  aspirin tablet 325 mg (325 mg Oral Given 06/21/21 1820)  amLODipine  (NORVASC) tablet 2.5 mg (has no administration in time range)  nitroGLYCERIN (NITROSTAT) SL tablet 0.4 mg (has no administration in time range)  isosorbide mononitrate (IMDUR) 24 hr tablet 90 mg (has no administration in time range)  ranolazine (RANEXA) 12 hr tablet 500 mg (500 mg Oral Given 06/21/21 2344)  rosuvastatin (CRESTOR) tablet 5 mg (5 mg Oral Given 06/21/21 2346)  temazepam (RESTORIL) capsule 15 mg (15 mg Oral Given 06/21/21 2344)  pantoprazole (PROTONIX) EC tablet 40 mg (40 mg Oral Given 06/21/21 2346)  nitroGLYCERIN (NITROSTAT) SL tablet 0.4 mg (has no administration in time range)  acetaminophen (TYLENOL) tablet 650 mg (has no administration in time range)  ondansetron (ZOFRAN) injection 4 mg (has no administration in time range)  heparin injection 5,000 Units (5,000 Units Subcutaneous Given 06/21/21 2345)  traMADol (ULTRAM) tablet 50 mg (50 mg Oral Given 06/21/21 2344)  alum & mag hydroxide-simeth (MAALOX/MYLANTA) 200-200-20 MG/5ML suspension 30 mL (30 mLs Oral Given 06/21/21 1541)  morphine 2 MG/ML injection 2 mg (2 mg Intravenous Given 06/21/21 1819)  aspirin chewable tablet 324 mg (324 mg Oral Given 06/21/21 2344)    Or  aspirin suppository 300 mg ( Rectal See Alternative 06/21/21 2344)    ED Course  I have reviewed the triage vital signs and the nursing notes.  Pertinent labs & imaging results that were available during my care of the patient were reviewed by me and considered in my medical decision making (see chart for details).    MDM Rules/Calculators/A&P HEAR Score: 6                         Michaeline Eckersley is a 75 y.o. female presenting with CP. VS wnl. HEAR score 6, pt high risk given prior CAD.   EKG interpretation: NSR, rate 78 bpm, no ST elevations or depressions, T wave inversion in aVL.  Labs: CBC wnl, BMP with mildly elevated creatinine from prior baseline. Initial troponin 5, repeat 5.  COVID/flu negative.  BNP 30.  Lipase 36.  Imaging: CXR without  acute cardiopulmonary abnormalities. Imaging reviewed by radiology and personally by me.  DDX considered: ACS, PE, pneumonia, pneumothorax, arrhythmia, costochondritis, myocarditis, rib fracture, pericardial tamponade, GERD, aortic dissection, pancreatitis, CHF exacerbation, COPD exacerbation. History, examination findings, and objective data most consistent with unstable angina and high risk patient given history of prior CABG. Given patient's HEAR score and troponin, low concern for active MI at this time. Low suspicion for PE due to [facility] PE score of 0, lack of pleuritic pain, no history of blood clots, and PE findings not suggestive of DVT. Aortic dissection not suspected due to normotension and equal pulses.  Suspect component of GERD, patient improved after Maalox and reports history of GERD symptoms.  No systemic  infectious signs or symptoms.  No signs of trauma on exam.  No respiratory symptoms.  Medications: Medications  nitroGLYCERIN (NITROSTAT) SL tablet 0.4 mg (0.4 mg Sublingual Given 06/21/21 1819)  aspirin tablet 325 mg (325 mg Oral Given 06/21/21 1820)  amLODipine (NORVASC) tablet 2.5 mg (has no administration in time range)  nitroGLYCERIN (NITROSTAT) SL tablet 0.4 mg (has no administration in time range)  isosorbide mononitrate (IMDUR) 24 hr tablet 90 mg (has no administration in time range)  ranolazine (RANEXA) 12 hr tablet 500 mg (500 mg Oral Given 06/21/21 2344)  rosuvastatin (CRESTOR) tablet 5 mg (5 mg Oral Given 06/21/21 2346)  temazepam (RESTORIL) capsule 15 mg (15 mg Oral Given 06/21/21 2344)  pantoprazole (PROTONIX) EC tablet 40 mg (40 mg Oral Given 06/21/21 2346)  nitroGLYCERIN (NITROSTAT) SL tablet 0.4 mg (has no administration in time range)  acetaminophen (TYLENOL) tablet 650 mg (has no administration in time range)  ondansetron (ZOFRAN) injection 4 mg (has no administration in time range)  heparin injection 5,000 Units (5,000 Units Subcutaneous Given 06/21/21 2345)   traMADol (ULTRAM) tablet 50 mg (50 mg Oral Given 06/21/21 2344)  alum & mag hydroxide-simeth (MAALOX/MYLANTA) 200-200-20 MG/5ML suspension 30 mL (30 mLs Oral Given 06/21/21 1541)  morphine 2 MG/ML injection 2 mg (2 mg Intravenous Given 06/21/21 1819)  aspirin chewable tablet 324 mg (324 mg Oral Given 06/21/21 2344)    Or  aspirin suppository 300 mg ( Rectal See Alternative 06/21/21 2344)    Patient re-evaluated after pain control. Hemodynamically stable and in no acute distress. Chest pain improved, but persistent after nitro and morphine.  Consulted cardiology.  Admitted to cardiology for observation. Patient understands and agrees with the plan.  Family updated at the bedside.  Transferred from the ED without issue.  The plan for this patient was discussed with my attending physician, who voiced agreement and who oversaw evaluation and treatment of this patient.     Note: Estate manager/land agent was used in the creation of this note.  Final Clinical Impression(s) / ED Diagnoses Final diagnoses:  Nonspecific chest pain    Rx / DC Orders ED Discharge Orders     None        Cherly Hensen, DO 06/22/21 0030    Tegeler, Gwenyth Allegra, MD 06/23/21 1433

## 2021-06-21 NOTE — H&P (Addendum)
Cardiology Admission History and Physical:   Patient ID: [name] MRN: [id number]; DOB: [birth year]   Admission date: 06/21/2021  PCP:  [name], MD   [place] [facility] Cardiologist:  [name], MD        Chief Complaint: Chest pain  Patient Profile:   [name] is a 75 y.o. female with history of right coronary artery stenting and coronary vasospasm who is being seen 06/21/2021 for the evaluation of chest pain.  History of Present Illness:   [name] has been experiencing intermittent chest pain over the past 4 days.  This morning's episode was her worst and prompted her evaluation in the emergency room today.  She describes a sharp pain in the left chest that involves into a spasm/muscle cramp feeling.  She feels like she has a difficult time catching her breath when the pain arises.  Her longest episode was this morning and the total duration was 10 minutes.  She took 3 nitroglycerin without immediate relief.  She admits to being under a lot of stress of late.  She continues to smoke cigarettes but has cut way back from her previous smoking.  She has chronic orthopnea with no recent change.  She denies any exertional component to her chest pain and her symptoms have primarily been at rest.  At the time of my evaluation she continues to feel like there is a cramp in her chest.   Past Medical History:  Diagnosis Date   Anemia    Anginal pain ([place])    last cp was last year   Anxiety    Arthritis    Blood transfusion    Chest pain 02/04/2016   Coronary artery disease    Coronary artery spasm, wth continued episodes of chest pain.  09/26/2013   Coronary vasospasm ([facility])    Diabetes mellitus without complication (HCC)    type II   GERD (gastroesophageal reflux disease)    Headache    Hiatal hernia    Hypercholesteremia    Hypertension    NSTEMI (non-ST elevated myocardial infarction) ([place]) not sure   NSVT (nonsustained ventricular  tachycardia) 09/26/2013   Panic attack     Past Surgical History:  Procedure Laterality Date   ABDOMINAL HYSTERECTOMY     PARTIAL HYSTERECTOMY   breast     right, tumor benign   BREAST EXCISIONAL BIOPSY Right    1961 (age 88) benign   CARDIAC CATHETERIZATION  2014   CARDIAC CATHETERIZATION  2012   CARDIAC CATHETERIZATION N/A 07/22/2016   Procedure: Left Heart Cath and Coronary Angiography;  Surgeon: Belva Crome, MD;  Location: [facility];  Service: Cardiovascular;  Laterality: N/A;   cardiac stent  2012   to RCA   CHOLECYSTECTOMY N/A 10/02/2017   Procedure: LAPAROSCOPIC CHOLECYSTECTOMY;  Surgeon: Ralene Ok, MD;  Location: [facility];  Service: General;  Laterality: N/A;   CORONARY ANGIOPLASTY     ESOPHAGEAL MANOMETRY N/A 10/29/2016   Procedure: ESOPHAGEAL MANOMETRY (EM);  Surgeon: Garlan Fair, MD;  Location: [facility];  Service: Endoscopy;  Laterality: N/A;   ESOPHAGOGASTRODUODENOSCOPY (EGD) WITH PROPOFOL N/A 10/28/2016   Procedure: ESOPHAGOGASTRODUODENOSCOPY (EGD) WITH PROPOFOL;  Surgeon: Garlan Fair, MD;  Location: [facility];  Service: Endoscopy;  Laterality: N/A;   EXCISION OF SKIN TAG N/A 07/29/2017   Procedure: EXCISION OF PERIANAL  SKIN TAG;  Surgeon: Ralene Ok, MD;  Location: [place];  Service: General;  Laterality: N/A;   KNEE SURGERY  left  left ear surgery     for bad cut   LEFT HEART CATHETERIZATION WITH CORONARY ANGIOGRAM N/A 02/22/2013   Procedure: LEFT HEART CATHETERIZATION WITH CORONARY ANGIOGRAM;  Surgeon: [name], MD;  Location: [facility];  Service: Cardiovascular;  Laterality: N/A;   SHOULDER SURGERY Bilateral    rotator cuff     Medications Prior to Admission: Prior to Admission medications   Medication Sig Start Date End Date Taking? Authorizing Provider  ACCU-CHEK GUIDE test strip USE TO CHECK BLOOD SUGAR DAILY 11/05/20   [provider]  Accu-Chek Softclix Lancets lancets daily. 11/05/20   [provider]  acetaminophen (TYLENOL) 500 MG tablet Take 500 mg by mouth daily as needed for moderate pain or headache.    [provider]  amLODipine (NORVASC) 2.5 MG tablet Take 1 tablet (2.5 mg total) by mouth daily. 01/25/17   Eugenie Filler, MD  aspirin EC 81 MG tablet Take 81 mg by mouth daily.    [provider]  baclofen (LIORESAL) 10 MG tablet Take by mouth. Patient not taking: No sig reported 05/01/20   [provider]  buPROPion (WELLBUTRIN SR) 150 MG 12 hr tablet Take 150 mg by mouth daily. For smoking cessation    [provider]  buPROPion (ZYBAN) 150 MG 12 hr tablet Take 150 mg by mouth every morning. 06/25/20   [provider]  calcium-vitamin D (OSCAL WITH D) 500-200 MG-UNIT per tablet Take 1 tablet by mouth daily.  Patient not taking: No sig reported    [provider]  clonazePAM (KLONOPIN) 0.5 MG tablet Take 0.5 mg by mouth 2 (two) times daily as needed for anxiety.     [provider]  dicyclomine (BENTYL) 20 MG tablet Take 20 mg by mouth 3 (three) times daily as needed. Patient not taking: No sig reported 04/23/20   [provider]  isosorbide mononitrate (IMDUR) 60 MG 24 hr tablet TAKE 1 AND 1/2 TABLETS BY MOUTH DAILY. NEED TO SCHEDULE YEARLY APPOINTMENT FOR FURTHER REFILLS 07/05/18   Belva Crome, MD  metFORMIN (GLUCOPHAGE) 500 MG tablet Take 500 mg by mouth 2 (two) times daily.    [provider]  metFORMIN (GLUCOPHAGE-XR) 500 MG 24 hr tablet Take 500 mg by mouth 2 (two) times daily. 06/28/20   [provider]  nitroGLYCERIN (NITROSTAT) 0.4 MG SL tablet PLACE 1 TABLET UNDER TONGUE AS NEEDED FOR CHEST PAIN EVERY 5 MINUTES UP TO 3 DOSES THEN SEEK MEDICAL ATTENTION. Please make overdue appt. 1st attempt 02/18/19   Belva Crome, MD  nortriptyline (PAMELOR) 50 MG capsule Take 50 mg by mouth daily. Patient not taking: No sig reported 06/10/20   [provider]  ondansetron  (ZOFRAN ODT) 4 MG disintegrating tablet Take 1 tablet (4 mg total) by mouth every 8 (eight) hours as needed for nausea or vomiting. Patient not taking: No sig reported 10/29/20   Long, Wonda Olds, MD  oxyCODONE-acetaminophen (PERCOCET/ROXICET) 5-325 MG tablet Take 1 tablet by mouth every 8 (eight) hours as needed for severe pain. 05/20/21   Newt Minion, MD  pantoprazole (PROTONIX) 40 MG tablet Take 1 tablet (40 mg total) by mouth 2 (two) times daily. 04/20/14   Isaiah Serge, NP  predniSONE (DELTASONE) 50 MG tablet Take one tablet by mouth once daily for 5 days. 03/06/21   Suzan Slick, NP  pregabalin (LYRICA) 25 MG capsule Take 25 mg by mouth 2 (two) times daily. Patient not taking: No  sig reported 06/03/20   [provider]  ranitidine (ZANTAC) 150 MG tablet Take 150 mg by mouth 2 (two) times daily.     [provider]  ranolazine (RANEXA) 500 MG 12 hr tablet TAKE 1 TABLET(500 MG) BY MOUTH TWICE DAILY 02/13/21   Belva Crome, MD  rosuvastatin (CRESTOR) 5 MG tablet Take 1 tablet (5 mg total) by mouth daily at 6 PM. 01/24/17   Eugenie Filler, MD  senna-docusate (SENOKOT-S) 8.6-50 MG tablet Take 1 tablet by mouth at bedtime as needed for mild constipation or moderate constipation. 10/29/20   Long, Wonda Olds, MD  temazepam (RESTORIL) 15 MG capsule Take 15 mg by mouth at bedtime as needed. 10/02/20   [provider]     Allergies:   No Known Allergies  Social History:   Social History   Socioeconomic History   Marital status: Married    Spouse name: [name]   Number of children: 0   Years of education: Not on file   Highest education level: Not on file  Occupational History   Not on file  Tobacco Use   Smoking status: Some Days    Packs/day: 0.50    Years: 40.00    Pack years: 20.00    Types: Cigarettes    Last attempt to quit: 01/10/2016    Years since quitting: 5.4   Smokeless tobacco: Never   Tobacco comments:    occasional   Vaping Use   Vaping Use:  Never used  Substance and Sexual Activity   Alcohol use: Yes    Alcohol/week: 1.0 standard drink    Types: 1 Cans of beer per week    Comment: occasional beer   Drug use: No   Sexual activity: Never  Other Topics Concern   Not on file  Social History Narrative   Lives with husband. Ambulates independently.   Social Determinants of Health   Financial Resource Strain: Not on file  Food Insecurity: Not on file  Transportation Needs: Not on file  Physical Activity: Not on file  Stress: Not on file  Social Connections: Not on file  Intimate Partner Violence: Not on file    Family History:   The patient's family history includes Heart attack in an other family member; Heart failure in her mother; Stroke in her sister.    ROS:  Please see the history of present illness.  Positive for nausea and GI upset.  All other ROS reviewed and negative.     Physical Exam/Data:   Vitals:   06/21/21 1715 06/21/21 1730 06/21/21 1745 06/21/21 1800  BP: 113/66 111/61 108/66 (!) 113/59  Pulse: 61 72 65 68  Resp: 19 17 18 19   Temp:      TempSrc:      SpO2: 100% 100% 100% 100%   No intake or output data in the 24 hours ending 06/21/21 1819 Last 3 Weights 04/22/2021 02/13/2021 12/03/2020  Weight (lbs) 120 lb 119 lb 12.8 oz 121 lb  Weight (kg) 54.432 kg 54.341 kg 54.885 kg     There is no height or weight on file to calculate BMI.  General:  Well nourished, well developed, in no acute distress HEENT: normal Neck: no JVD Vascular: No carotid bruits; Distal pulses 2+ bilaterally   Cardiac:  normal S1, S2; RRR; no murmur  Lungs:  clear to auscultation bilaterally, no wheezing, rhonchi or rales  Abd: soft, nontender, no hepatomegaly  Ext: no edema Musculoskeletal:  No deformities, BUE and BLE strength  normal and equal Skin: warm and dry  Neuro:  CNs 2-12 intact, no focal abnormalities noted Psych:  Normal affect    EKG:  The ECG that was done today was personally reviewed and demonstrates  normal sinus rhythm 78 bpm, nonspecific T wave abnormality  Relevant CV Studies: Echocardiogram 05/15/2019:   1. Left ventricular ejection fraction, by visual estimation, is 45 to  50%. The left ventricle has mildly decreased function. Normal left  ventricular size. There is no left ventricular hypertrophy.   2. Entire lateral wall is abnormal.   3. Elevated left ventricular end-diastolic pressure.   4. Left ventricular diastolic Doppler parameters are consistent with  impaired relaxation pattern of LV diastolic filling.   5. Global right ventricle has normal systolic function.The right  ventricular size is normal. No increase in right ventricular wall  thickness.   6. Left atrial size was normal.   7. Right atrial size was normal.   8. The mitral valve is normal in structure. Trace mitral valve  regurgitation. No evidence of mitral stenosis.   9. The tricuspid valve is normal in structure. Tricuspid valve  regurgitation is trivial.  10. The aortic valve is normal in structure. Aortic valve regurgitation  was not visualized by color flow Doppler. Structurally normal aortic  valve, with no evidence of sclerosis or stenosis.  11. The pulmonic valve was normal in structure. Pulmonic valve  regurgitation is not visualized by color flow Doppler.  12. Normal pulmonary artery systolic pressure.  13. The inferior vena cava is normal in size with <50% respiratory  variability, suggesting right atrial pressure of 8 mmHg.   Laboratory Data:  High Sensitivity Troponin:   Recent Labs  Lab 06/21/21 1159 06/21/21 1458  TROPONINIHS 5 5      Chemistry Recent Labs  Lab 06/21/21 1159  NA 143  K 4.1  CL 110  CO2 25  GLUCOSE 121*  BUN 9  CREATININE 1.13*  CALCIUM 9.6  GFRNONAA 51*  ANIONGAP 8    No results for input(s): PROT, ALBUMIN, AST, ALT, ALKPHOS, BILITOT in the last 168 hours. Lipids No results for input(s): CHOL, TRIG, HDL, LABVLDL, LDLCALC, CHOLHDL in the last 168  hours. Hematology Recent Labs  Lab 06/21/21 1159  WBC 5.8  RBC 4.57  HGB 14.4  HCT 42.9  MCV 93.9  MCH 31.5  MCHC 33.6  RDW 14.0  PLT 271   Thyroid No results for input(s): TSH, FREET4 in the last 168 hours. BNP Recent Labs  Lab 06/21/21 1559  BNP 30.0    DDimer No results for input(s): DDIMER in the last 168 hours.   Radiology/Studies:  DG Chest 2 View  Result Date: 06/21/2021 CLINICAL DATA:  Chest pain EXAM: CHEST - 2 VIEW COMPARISON:  Chest x-ray 01/23/2017 FINDINGS: Heart size and mediastinal contours are within normal limits. No suspicious pulmonary opacities identified. No pleural effusion or pneumothorax visualized. No acute osseous abnormality appreciated. IMPRESSION: No acute intrathoracic process identified. Electronically Signed   By: Ofilia Neas M.D.   On: 06/21/2021 12:13     Assessment and Plan:   Chest pain, typical and atypical features: Patient with history of coronary vasospasm.  Currently with no objective evidence of ischemia with normal high-sensitivity troponin x2, essentially normal EKG.  She continues to have some discomfort in her chest.  Will observe overnight and repeat a troponin tomorrow morning.  Will provide analgesics as needed.  We will continue her home antianginal regimen which includes amlodipine, ranolazine, and isosorbide.  I do not think she meets indication to start on IV heparin as my overall suspicion for ACS is pretty low.  As long as her pain has resolved tomorrow, I think it would be reasonable to perform an outpatient Myoview scan to evaluate for ischemia. 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  BNP is normal.  No evidence of volume excess on exam.  Continue with medical therapy.  Check 2D echocardiogram. Tobacco abuse: Cessation counseling done.  Patient understands the association between tobacco use and coronary spasm.   Risk  Assessment/Risk Scores:    HEAR Score (for undifferentiated chest pain):  HEAR Score: 6       Severity of Illness: The appropriate patient status for this patient is OBSERVATION. Observation status is judged to be reasonable and necessary in order to provide the required intensity of service to ensure the patient's safety. The patient's presenting symptoms, physical exam findings, and initial radiographic and laboratory data in the context of their medical condition is felt to place them at decreased risk for further clinical deterioration. Furthermore, it is anticipated that the patient will be medically stable for discharge from the hospital within 2 midnights of admission.    For questions or updates, please contact [place] Please consult [email or web address] for contact info under     Signed, Sherren Mocha, MD  06/21/2021 6:19 PM

## 2021-06-22 ENCOUNTER — Other Ambulatory Visit (HOSPITAL_COMMUNITY): Payer: Medicare Other

## 2021-06-22 ENCOUNTER — Encounter (HOSPITAL_COMMUNITY): Payer: Self-pay | Admitting: Cardiovascular Disease

## 2021-06-22 ENCOUNTER — Other Ambulatory Visit: Payer: Self-pay | Admitting: Physician Assistant

## 2021-06-22 DIAGNOSIS — Z7984 Long term (current) use of oral hypoglycemic drugs: Secondary | ICD-10-CM | POA: Diagnosis not present

## 2021-06-22 DIAGNOSIS — Z7982 Long term (current) use of aspirin: Secondary | ICD-10-CM | POA: Diagnosis not present

## 2021-06-22 DIAGNOSIS — R072 Precordial pain: Secondary | ICD-10-CM

## 2021-06-22 DIAGNOSIS — I2510 Atherosclerotic heart disease of native coronary artery without angina pectoris: Secondary | ICD-10-CM | POA: Diagnosis not present

## 2021-06-22 DIAGNOSIS — F17210 Nicotine dependence, cigarettes, uncomplicated: Secondary | ICD-10-CM | POA: Diagnosis not present

## 2021-06-22 DIAGNOSIS — E119 Type 2 diabetes mellitus without complications: Secondary | ICD-10-CM | POA: Diagnosis not present

## 2021-06-22 DIAGNOSIS — Z955 Presence of coronary angioplasty implant and graft: Secondary | ICD-10-CM | POA: Diagnosis not present

## 2021-06-22 DIAGNOSIS — I10 Essential (primary) hypertension: Secondary | ICD-10-CM | POA: Diagnosis not present

## 2021-06-22 DIAGNOSIS — Z79899 Other long term (current) drug therapy: Secondary | ICD-10-CM | POA: Diagnosis not present

## 2021-06-22 DIAGNOSIS — Z20822 Contact with and (suspected) exposure to covid-19: Secondary | ICD-10-CM | POA: Diagnosis not present

## 2021-06-22 LAB — LIPID PANEL
Cholesterol: 155 mg/dL (ref 0–200)
HDL: 75 mg/dL (ref >40)
LDL Cholesterol: 64 mg/dL (ref 0–99)
Total CHOL/HDL Ratio: 2.1 RATIO
Triglycerides: 80 mg/dL (ref <150)
VLDL: 16 mg/dL (ref 0–40)

## 2021-06-22 LAB — BASIC METABOLIC PANEL
Anion gap: 8 (ref 5–15)
BUN: 13 mg/dL (ref 8–23)
CO2: 24 mmol/L (ref 22–32)
Calcium: 8.9 mg/dL (ref 8.9–10.3)
Chloride: 107 mmol/L (ref 98–111)
Creatinine, Ser: 0.77 mg/dL (ref 0.44–1.00)
GFR, Estimated: >60 mL/min (ref >60)
Glucose, Bld: 100 mg/dL — ABNORMAL HIGH (ref 70–99)
Potassium: 3.9 mmol/L (ref 3.5–5.1)
Sodium: 139 mmol/L (ref 135–145)

## 2021-06-22 LAB — GLUCOSE, CAPILLARY
Glucose-Capillary: 120 mg/dL — ABNORMAL HIGH (ref 70–99)
Glucose-Capillary: 196 mg/dL — ABNORMAL HIGH (ref 70–99)

## 2021-06-22 LAB — TROPONIN I (HIGH SENSITIVITY): Troponin I (High Sensitivity): 4 ng/L (ref <18)

## 2021-06-22 LAB — CBC
HCT: 40.2 % (ref 36.0–46.0)
Hemoglobin: 13.2 g/dL (ref 12.0–15.0)
MCH: 30.9 pg (ref 26.0–34.0)
MCHC: 32.8 g/dL (ref 30.0–36.0)
MCV: 94.1 fL (ref 80.0–100.0)
Platelets: 241 10*3/uL (ref 150–400)
RBC: 4.27 MIL/uL (ref 3.87–5.11)
RDW: 14 % (ref 11.5–15.5)
WBC: 5.7 10*3/uL (ref 4.0–10.5)
nRBC: 0 % (ref 0.0–0.2)

## 2021-06-22 NOTE — Progress Notes (Signed)
Pt safely discharged. Discharge packet provided with teach-back method. VS wnL and as per flow. IVs removed, Pt verbalized understanding. All questions and concerns addressed. Awaiting husband for transport.

## 2021-06-22 NOTE — Progress Notes (Addendum)
Progress Note  Patient Name: [name] Date of Encounter: 06/22/2021  [facility] Cardiologist: Belva Crome III, MD   Subjective   Had episodes of chest fullness and shortness of breath last night. This is the same symptom that brought her here.   Inpatient Medications    Scheduled Meds:  amLODipine  2.5 mg Oral Daily   aspirin  325 mg Oral Daily   heparin  5,000 Units Subcutaneous Q8H   isosorbide mononitrate  90 mg Oral Daily   pantoprazole  40 mg Oral BID   ranolazine  500 mg Oral BID   rosuvastatin  5 mg Oral q1800   Continuous Infusions:  PRN Meds: acetaminophen, nitroGLYCERIN, nitroGLYCERIN, nitroGLYCERIN, ondansetron (ZOFRAN) IV, temazepam, traMADol   Vital Signs    Vitals:   06/21/21 1900 06/21/21 1915 06/21/21 2119 06/22/21 0347  BP: 121/65 112/60  106/70  Pulse: (!) 56 (!) 58  (!) 56  Resp: 20 15  20   Temp:  98 F (36.7 C) 98.2 F (36.8 C) 98.2 F (36.8 C)  TempSrc:  Oral Oral Oral  SpO2: 100% 100%  100%  Weight:   52 kg   Height:   4\' 11"  (1.499 m)    No intake or output data in the 24 hours ending 06/22/21 0827 Last 3 Weights 06/21/2021 04/22/2021 02/13/2021  Weight (lbs) 114 lb 11.2 oz 120 lb 119 lb 12.8 oz  Weight (kg) 52.028 kg 54.432 kg 54.341 kg      Telemetry    NSR without significant ventricular ectopy, HR 50s - Personally Reviewed  ECG    NSR without significant ST-T wave changes - Personally Reviewed  Physical Exam   GEN: No acute distress.   Neck: No JVD Cardiac: RRR, no murmurs, rubs, or gallops.  Respiratory: Clear to auscultation bilaterally. L basilar crackles on exam GI: Soft, nontender, non-distended  MS: No edema; No deformity. Neuro:  Nonfocal  Psych: Normal affect   Labs    High Sensitivity Troponin:   Recent Labs  Lab 06/21/21 1159 06/21/21 1458 06/22/21 0336  TROPONINIHS 5 5 4      Chemistry Recent Labs  Lab 06/21/21 1159 06/21/21 2156 06/22/21 0336  NA 143  --  139  K 4.1  --  3.9  CL  110  --  107  CO2 25  --  24  GLUCOSE 121*  --  100*  BUN 9  --  13  CREATININE 1.13* 0.91 0.77  CALCIUM 9.6  --  8.9  MG  --  1.6*  --   PROT  --  5.7*  --   ALBUMIN  --  3.1*  --   AST  --  11*  --   ALT  --  12  --   ALKPHOS  --  73  --   BILITOT  --  0.7  --   GFRNONAA 51* >60 >60  ANIONGAP 8  --  8    Lipids  Recent Labs  Lab 06/22/21 0336  CHOL 155  TRIG 80  HDL 75  LDLCALC 64  CHOLHDL 2.1    Hematology Recent Labs  Lab 06/21/21 1159 06/21/21 2156 06/22/21 0336  WBC 5.8 6.1 5.7  RBC 4.57 4.37 4.27  HGB 14.4 13.6 13.2  HCT 42.9 41.2 40.2  MCV 93.9 94.3 94.1  MCH 31.5 31.1 30.9  MCHC 33.6 33.0 32.8  RDW 14.0 13.9 14.0  PLT 271 252 241   Thyroid  Recent Labs  Lab 06/21/21 2156  TSH 1.952    BNP Recent Labs  Lab 06/21/21 1559  BNP 30.0    DDimer No results for input(s): DDIMER in the last 168 hours.   Radiology    DG Chest 2 View  Result Date: 06/21/2021 CLINICAL DATA:  Chest pain EXAM: CHEST - 2 VIEW COMPARISON:  Chest x-ray 01/23/2017 FINDINGS: Heart size and mediastinal contours are within normal limits. No suspicious pulmonary opacities identified. No pleural effusion or pneumothorax visualized. No acute osseous abnormality appreciated. IMPRESSION: No acute intrathoracic process identified. Electronically Signed   By: Ofilia Neas M.D.   On: 06/21/2021 12:13    Cardiac Studies   Cath 07/22/2016 The left ventricular ejection fraction is 45-50% by visual estimate. Ost 1st Diag lesion, 40 %stenosed. Prox LAD lesion, 30 %stenosed. Mid RCA lesion, 40 %stenosed.   Diffuse 30-40% narrowing in the mid right coronary/in-stent restenosis. Otherwise widely patent coronaries with the exception of a 40-50% ostial diagonal 1. Overall low normal LV function with EF of 50%. Intense radial/brachial spasm during the case requiring that catheter size be downgraded to 4 [place].   RECOMMENDATIONS:   Continue medical therapy  Echo 05/15/2019  1.  Left ventricular ejection fraction, by visual estimation, is 45 to  50%. The left ventricle has mildly decreased function. Normal left  ventricular size. There is no left ventricular hypertrophy.   2. Entire lateral wall is abnormal.   3. Elevated left ventricular end-diastolic pressure.   4. Left ventricular diastolic Doppler parameters are consistent with  impaired relaxation pattern of LV diastolic filling.   5. Global right ventricle has normal systolic function.The right  ventricular size is normal. No increase in right ventricular wall  thickness.   6. Left atrial size was normal.   7. Right atrial size was normal.   8. The mitral valve is normal in structure. Trace mitral valve  regurgitation. No evidence of mitral stenosis.   9. The tricuspid valve is normal in structure. Tricuspid valve  regurgitation is trivial.  10. The aortic valve is normal in structure. Aortic valve regurgitation  was not visualized by color flow Doppler. Structurally normal aortic  valve, with no evidence of sclerosis or stenosis.  11. The pulmonic valve was normal in structure. Pulmonic valve  regurgitation is not visualized by color flow Doppler.  12. Normal pulmonary artery systolic pressure.  13. The inferior vena cava is normal in size with <50% respiratory  variability, suggesting right atrial pressure of 8 mmHg.   Patient Profile     75 y.o. female with PMH of CAD with significant radial/brachial arterial vasospasm, HTN, HLD and DM II presented with chest pain  Assessment & Plan    Chest pain  - Hs trop negative x 3  - describe as left sided chest fullness lasting 10-15 min since beginning of this week. Not related to exertion  - per Dr. Burt Knack, if chest pain improve overnight, plan to discharge today to have outpatient myoview  - per patient she still has episodic chest fullness last night and this morning. Has not eaten, but drank a cup of coffee this morning, either way, unable to do  myoview as inpatient today due to caffeine intake. Will discuss with MD  CAD: Cath 07/22/2016 showed EF 40-50%, 40% ost D1, 30% prox LAD, 40% mid RCA. Significant radial/brachial arterial vasospasm  HTN  HLD  DM II  Tobacco abuse: tobacco cessation emphasized yesterday  For questions or updates, please contact [place] Please consult [email or web address] for contact info under  Hilbert Corrigan, PA  06/22/2021, 8:27 AM     Personally seen and examined. Agree with above.  Currently feeling well, no active chest discomfort.  Troponins are negative.  EKG unremarkable with no ischemic changes.  Prior chest CT personally reviewed does demonstrate some coronary calcification in the LAD as well as right coronary artery.  Her last nuclear stress test was in 2017.  Prior cardiac catheterization as above showed nonobstructive disease.  She is on excellent antianginal medications including Ranexa isosorbide amlodipine.  We will continue with these.  I am comfortable with her being discharged from the hospital given that there is no acute changes.  We will have her set up for close follow-up in the surgery clinic for nuclear stress test.  We will get this set up and arranged.  Continue with treatment of hypertension hyperlipidemia and diabetes and tobacco cessation.  Patient is amenable to plan.  Candee Furbish, MD

## 2021-06-22 NOTE — Discharge Summary (Addendum)
Discharge Summary    Patient ID: [name] MRN: [id number]; DOB: November 18, 1945  Admit date: 06/21/2021 Discharge date: 06/22/2021  PCP:  [name], MD   [facility] Providers Cardiologist:  [name], MD        Discharge Diagnoses    Principal Problem:   Precordial chest pain Active Problems:   HTN (hypertension)   GERD (gastroesophageal reflux disease)   Dyslipidemia   Coronary artery vasospasm (HCC)   Chest pain    Diagnostic Studies/Procedures    N/A _____________   History of Present Illness     [name] is a 75 y.o. female with history of right coronary artery stenting and coronary vasospasm who is being seen 06/21/2021 for the evaluation of chest pain.  [name] has been experiencing intermittent chest pain over the past 4 days.  This morning's episode was her worst and prompted her evaluation in the emergency room today.  She describes a sharp pain in the left chest that involves into a spasm/muscle cramp feeling.  She feels like she has a difficult time catching her breath when the pain arises.  Her longest episode was this morning and the total duration was 10 minutes.  She took 3 nitroglycerin without immediate relief.  She admits to being under a lot of stress of late.  She continues to smoke cigarettes but has cut way back from her previous smoking.  She has chronic orthopnea with no recent change.  She denies any exertional component to her chest pain and her symptoms have primarily been at rest.  At the time of my evaluation she continues to feel like there is a cramp in her chest.    Hospital Course     Consultants: N/A   Patient was admitted to cardiology service overnight, serial troponin was negative x3.  Her chest pain was felt to be somewhat atypical.  Previous cardiac catheterization performed on 07/22/2016 showed EF 45 to 50%, 40% ostial D1, 30% proximal LAD, 40% mid RCA lesion.  Patient had intense radial/brachial  spasm during the case.  Patient was seen in the morning of 06/22/2021 by Dr. Marlou Porch, her symptom has been stable and she is felt to be stable for discharge from cardiac perspective.  We will plan for outpatient nuclear stress test prior to her next follow-up.   Did the patient have an acute coronary syndrome (MI, NSTEMI, STEMI, etc) this admission?:  No                               Did the patient have a percutaneous coronary intervention (stent / angioplasty)?:  No.    Shared Decision Making/Informed Consent The risks [chest pain, shortness of breath, cardiac arrhythmias, dizziness, blood pressure fluctuations, myocardial infarction, stroke/transient ischemic attack, nausea, vomiting, allergic reaction, radiation exposure, metallic taste sensation and life-threatening complications (estimated to be 1 in 10,000)], benefits (risk stratification, diagnosing coronary artery disease, treatment guidance) and alternatives of a nuclear stress test were discussed in detail with [name] and she agrees to proceed.     _____________  Discharge Vitals Blood pressure 115/63, pulse (!) 54, temperature 98 F (36.7 C), temperature source Oral, resp. rate 17, height 4\' 11"  (1.499 m), weight 52 kg, SpO2 100 %.  Filed Weights   06/21/21 2119  Weight: 52 kg    Labs & Radiologic Studies    CBC Recent Labs    06/21/21 2156 06/22/21 0336  WBC 6.1  5.7  HGB 13.6 13.2  HCT 41.2 40.2  MCV 94.3 94.1  PLT 252 270   Basic Metabolic Panel Recent Labs    06/21/21 1159 06/21/21 2156 06/22/21 0336  NA 143  --  139  K 4.1  --  3.9  CL 110  --  107  CO2 25  --  24  GLUCOSE 121*  --  100*  BUN 9  --  13  CREATININE 1.13* 0.91 0.77  CALCIUM 9.6  --  8.9  MG  --  1.6*  --    Liver Function Tests Recent Labs    06/21/21 2156  AST 11*  ALT 12  ALKPHOS 73  BILITOT 0.7  PROT 5.7*  ALBUMIN 3.1*   Recent Labs    06/21/21 1532  LIPASE 36   High Sensitivity Troponin:   Recent Labs  Lab  06/21/21 1159 06/21/21 1458 06/22/21 0336  TROPONINIHS 5 5 4     BNP Invalid input(s): POCBNP D-Dimer No results for input(s): DDIMER in the last 72 hours. Hemoglobin A1C Recent Labs    06/21/21 2156  HGBA1C 6.6*   Fasting Lipid Panel Recent Labs    06/22/21 0336  CHOL 155  HDL 75  LDLCALC 64  TRIG 80  CHOLHDL 2.1   Thyroid Function Tests Recent Labs    06/21/21 2156  TSH 1.952   _____________  DG Chest 2 View  Result Date: 06/21/2021 CLINICAL DATA:  Chest pain EXAM: CHEST - 2 VIEW COMPARISON:  Chest x-ray 01/23/2017 FINDINGS: Heart size and mediastinal contours are within normal limits. No suspicious pulmonary opacities identified. No pleural effusion or pneumothorax visualized. No acute osseous abnormality appreciated. IMPRESSION: No acute intrathoracic process identified. Electronically Signed   By: Ofilia Neas M.D.   On: 06/21/2021 12:13   US Abdomen Complete  Result Date: 06/10/2021 CLINICAL DATA:  One year of abdominal pain. EXAM: ABDOMEN ULTRASOUND COMPLETE COMPARISON:  CT October 29, 2020 FINDINGS: Gallbladder: Surgically absent Common bile duct: Diameter: 4 mm Liver: No focal lesion identified. Heterogeneously increased parenchymal echogenicity. Portal vein is patent on color Doppler imaging with normal direction of blood flow towards the liver. IVC: No abnormality visualized. Pancreas: Visualized portion unremarkable. Spleen: Size and appearance within normal limits. Right Kidney: Length: 10.5 cm. Echogenicity within normal limits. No mass or hydronephrosis visualized. Left Kidney: Length: 9.9 cm. Echogenicity within normal limits. No mass or hydronephrosis visualized. Abdominal aorta: No aneurysm visualized. Other findings: None. IMPRESSION: 1. The echogenicity of the liver is increased. This is a nonspecific finding but is most commonly seen with fatty infiltration of the liver. There are no obvious focal liver lesions. 2. Prior cholecystectomy. Electronically  Signed   By: Dahlia Bailiff M.D.   On: 06/10/2021 14:31   XR Shoulder Left  Result Date: 06/17/2021 2 view radiographs of the left shoulder shows superior migration of the humeral head within the glenoid.  Decreased subacromial joint space.  Disposition   Pt is being discharged home today in good condition.  Follow-up Plans & Appointments     Follow-up Information     Belva Crome, MD Follow up.   Specialty: Cardiology Why: office scheduler will contact you to arrange a outpatient nuclear stress test and then follow up with Dr. Tamala Julian or his APP afterward. Contact information: [address] [place] [address] [phone]                Discharge Instructions     Diet - low sodium  heart healthy   Complete by: As directed    Increase activity slowly   Complete by: As directed        Discharge Medications   Allergies as of 06/22/2021   No Known Allergies      Medication List     STOP taking these medications    ondansetron 4 MG disintegrating tablet Commonly known as: Zofran ODT   predniSONE 50 MG tablet Commonly known as: DELTASONE       TAKE these medications    Accu-Chek Guide test strip Generic drug: glucose blood USE TO CHECK BLOOD SUGAR DAILY   Accu-Chek Softclix Lancets lancets daily.   acetaminophen 500 MG tablet Commonly known as: TYLENOL Take 500 mg by mouth daily as needed for moderate pain or headache.   amLODipine 2.5 MG tablet Commonly known as: NORVASC Take 1 tablet (2.5 mg total) by mouth daily.   aspirin EC 81 MG tablet Take 81 mg by mouth daily.   buPROPion 150 MG 12 hr tablet Commonly known as: ZYBAN Take 150 mg by mouth every morning.   isosorbide mononitrate 60 MG 24 hr tablet Commonly known as: IMDUR TAKE 1 AND 1/2 TABLETS BY MOUTH DAILY. NEED TO SCHEDULE YEARLY APPOINTMENT FOR FURTHER REFILLS What changed: See the new instructions.   metFORMIN 500 MG 24 hr tablet Commonly known as:  GLUCOPHAGE-XR Take 500 mg by mouth 2 (two) times daily.   nitroGLYCERIN 0.4 MG SL tablet Commonly known as: NITROSTAT PLACE 1 TABLET UNDER TONGUE AS NEEDED FOR CHEST PAIN EVERY 5 MINUTES UP TO 3 DOSES THEN SEEK MEDICAL ATTENTION. Please make overdue appt. 1st attempt What changed:  how much to take how to take this when to take this reasons to take this additional instructions   oxyCODONE-acetaminophen 5-325 MG tablet Commonly known as: PERCOCET/ROXICET Take 1 tablet by mouth every 8 (eight) hours as needed for severe pain.   pantoprazole 40 MG tablet Commonly known as: PROTONIX Take 1 tablet (40 mg total) by mouth 2 (two) times daily.   pregabalin 25 MG capsule Commonly known as: LYRICA Take 25 mg by mouth 2 (two) times daily.   ranitidine 150 MG tablet Commonly known as: ZANTAC Take 150 mg by mouth 2 (two) times daily.   ranolazine 500 MG 12 hr tablet Commonly known as: Ranexa TAKE 1 TABLET(500 MG) BY MOUTH TWICE DAILY What changed:  how much to take how to take this when to take this additional instructions   rosuvastatin 5 MG tablet Commonly known as: CRESTOR Take 1 tablet (5 mg total) by mouth daily at 6 PM. What changed: when to take this   senna-docusate 8.6-50 MG tablet Commonly known as: Senokot-S Take 1 tablet by mouth at bedtime as needed for mild constipation or moderate constipation. What changed: when to take this   temazepam 15 MG capsule Commonly known as: RESTORIL Take 15 mg by mouth daily as needed for sleep.           Outstanding Labs/Studies   N/A  Duration of Discharge Encounter   Greater than 30 minutes including physician time.  Hilbert Corrigan, PA 06/22/2021, 11:09 AM  Personally seen and examined. Agree with above.  Currently feeling well, no active chest discomfort.  Troponins are negative.  EKG unremarkable with no ischemic changes.  Prior chest CT personally reviewed does demonstrate some coronary calcification in the  LAD as well as right coronary artery.  Her last nuclear stress test was in 2017.  Prior cardiac catheterization as  above showed nonobstructive disease.  She is on excellent antianginal medications including Ranexa isosorbide amlodipine.  We will continue with these.  I am comfortable with her being discharged from the hospital given that there is no acute changes.  We will have her set up for close follow-up in the surgery clinic for nuclear stress test.  We will get this set up and arranged.  Continue with treatment of hypertension hyperlipidemia and diabetes and tobacco cessation.  Patient is amenable to plan.   Candee Furbish, MD

## 2021-06-25 ENCOUNTER — Other Ambulatory Visit: Payer: Self-pay | Admitting: Physician Assistant

## 2021-06-25 DIAGNOSIS — R079 Chest pain, unspecified: Secondary | ICD-10-CM

## 2021-06-27 ENCOUNTER — Telehealth (HOSPITAL_COMMUNITY): Payer: Self-pay | Admitting: *Deleted

## 2021-06-27 NOTE — Telephone Encounter (Signed)
Patient given detailed instructions per Myocardial Perfusion Study Information Sheet for the test on  07/03/21 Patient notified to arrive 15 minutes early and that it is imperative to arrive on time for appointment to keep from having the test rescheduled.  If you need to cancel or reschedule your appointment, please call the office within 24 hours of your appointment. . Patient verbalized understanding. Kirstie Peri

## 2021-07-03 ENCOUNTER — Ambulatory Visit (HOSPITAL_COMMUNITY): Payer: Medicare Other | Attending: Physician Assistant

## 2021-07-03 ENCOUNTER — Other Ambulatory Visit: Payer: Self-pay

## 2021-07-03 DIAGNOSIS — R072 Precordial pain: Secondary | ICD-10-CM | POA: Diagnosis not present

## 2021-07-03 LAB — MYOCARDIAL PERFUSION IMAGING
LV dias vol: 99 mL (ref 46–106)
LV sys vol: 59 mL
Nuc Stress EF: 40 %
Peak HR: 98 {beats}/min
Rest HR: 70 {beats}/min
Rest Nuclear Isotope Dose: 9.2 mCi
SDS: 2
SRS: 13
SSS: 15
ST Depression (mm): 0 mm
Stress Nuclear Isotope Dose: 28.7 mCi
TID: 1.08

## 2021-07-03 MED ORDER — TECHNETIUM TC 99M TETROFOSMIN IV KIT
9.2000 | PACK | Freq: Once | INTRAVENOUS | Status: AC | PRN
  Administered 2021-07-03: 9.2 via INTRAVENOUS
  Filled 2021-07-03: qty 10

## 2021-07-03 MED ORDER — REGADENOSON 0.4 MG/5ML IV SOLN
0.4000 mg | Freq: Once | INTRAVENOUS | Status: AC
  Administered 2021-07-03: 0.4 mg via INTRAVENOUS

## 2021-07-03 MED ORDER — TECHNETIUM TC 99M TETROFOSMIN IV KIT
28.7000 | PACK | Freq: Once | INTRAVENOUS | Status: AC | PRN
  Administered 2021-07-03: 28.7 via INTRAVENOUS
  Filled 2021-07-03: qty 29

## 2021-07-10 ENCOUNTER — Other Ambulatory Visit: Payer: Self-pay

## 2021-07-10 ENCOUNTER — Ambulatory Visit
Admission: RE | Admit: 2021-07-10 | Discharge: 2021-07-10 | Disposition: A | Discharge status: Home / Self Care | Payer: Medicare Other | Source: Ambulatory Visit | Attending: Gastroenterology | Admitting: Gastroenterology

## 2021-07-10 DIAGNOSIS — M47816 Spondylosis without myelopathy or radiculopathy, lumbar region: Secondary | ICD-10-CM | POA: Diagnosis not present

## 2021-07-10 DIAGNOSIS — N200 Calculus of kidney: Secondary | ICD-10-CM | POA: Diagnosis not present

## 2021-07-10 DIAGNOSIS — R109 Unspecified abdominal pain: Secondary | ICD-10-CM

## 2021-07-10 DIAGNOSIS — R140 Abdominal distension (gaseous): Secondary | ICD-10-CM | POA: Diagnosis not present

## 2021-07-10 DIAGNOSIS — N3289 Other specified disorders of bladder: Secondary | ICD-10-CM | POA: Diagnosis not present

## 2021-07-10 DIAGNOSIS — E118 Type 2 diabetes mellitus with unspecified complications: Secondary | ICD-10-CM | POA: Diagnosis not present

## 2021-07-10 MED ORDER — IOPAMIDOL (ISOVUE-300) INJECTION 61%
100.0000 mL | Freq: Once | INTRAVENOUS | Status: AC | PRN
  Administered 2021-07-10: 100 mL via INTRAVENOUS

## 2021-07-12 ENCOUNTER — Other Ambulatory Visit: Payer: Self-pay

## 2021-07-13 ENCOUNTER — Ambulatory Visit (HOSPITAL_COMMUNITY)
Admission: EM | Admit: 2021-07-13 | Discharge: 2021-07-13 | Discharge status: Against Medical Advice | Payer: Medicare Other

## 2021-07-13 ENCOUNTER — Other Ambulatory Visit: Payer: Self-pay

## 2021-07-15 ENCOUNTER — Telehealth: Payer: Self-pay

## 2021-07-15 NOTE — Telephone Encounter (Addendum)
Left voice message for patient to give office a call back for results.   ----- Message from Bessie, [place] sent at 07/05/2021 11:57 AM EST ----- Reassuring stress test result, no sign of new significant reversible blockage. Please arrange follow up with Dr. Tamala Julian or his APP in the office in 4-6 weeks

## 2021-07-16 ENCOUNTER — Telehealth: Payer: Self-pay | Admitting: Psychiatry

## 2021-07-16 ENCOUNTER — Telehealth: Payer: Self-pay | Admitting: *Deleted

## 2021-07-16 NOTE — Telephone Encounter (Signed)
Follow Up:    Pt is returning call, concerning her Stress

## 2021-07-16 NOTE — Telephone Encounter (Signed)
Looks like [facility] reached out to pt.  Will route to their office.

## 2021-07-16 NOTE — Telephone Encounter (Signed)
Left message for pt to call, per dr smith's nurse, the patient can be seen on 08-14-21 or 08-23-21 in one of the held slots.

## 2021-07-16 NOTE — Telephone Encounter (Signed)
-----   Message from Jacqulynn Cadet, [place] sent at 07/16/2021  1:27 PM EST ----- Can you please call patient for me  ----- Message ----- From: Almyra Deforest, PA Sent: 07/05/2021  11:57 AM EST To: Jacqulynn Cadet, CMA  Reassuring stress test result, no sign of new significant reversible blockage. Please arrange follow up with Dr. Tamala Julian or his APP in the office in 4-6 weeks

## 2021-07-18 DIAGNOSIS — K219 Gastro-esophageal reflux disease without esophagitis: Secondary | ICD-10-CM | POA: Diagnosis not present

## 2021-07-18 DIAGNOSIS — K5904 Chronic idiopathic constipation: Secondary | ICD-10-CM | POA: Diagnosis not present

## 2021-07-18 DIAGNOSIS — R1030 Lower abdominal pain, unspecified: Secondary | ICD-10-CM | POA: Diagnosis not present

## 2021-07-19 DIAGNOSIS — K5904 Chronic idiopathic constipation: Secondary | ICD-10-CM | POA: Diagnosis not present

## 2021-07-28 NOTE — Progress Notes (Signed)
Just reviewed the chart for her ED visit and see that she presented with chest pain with a history of CAD with prior MI and previous echo showing slightly decreased EF. The BNP was to make sure she was not developing heart failure. Cardiology admitted patient and they reference the BNP result we collected in their notes. If it was significantly elevated it would have likely changed her management during her admission.   Thanks!

## 2021-07-31 ENCOUNTER — Telehealth: Payer: Self-pay | Admitting: Orthopedic Surgery

## 2021-07-31 ENCOUNTER — Other Ambulatory Visit: Payer: Self-pay | Admitting: Orthopedic Surgery

## 2021-07-31 MED ORDER — OXYCODONE-ACETAMINOPHEN 5-325 MG PO TABS
1.0000 | ORAL_TABLET | Freq: Three times a day (TID) | ORAL | 0 refills | Status: DC | PRN
Start: 2021-07-31 — End: 2021-09-23

## 2021-07-31 NOTE — Telephone Encounter (Signed)
Pt called requesting a refill of oxycodone. Please send to pharmacy on file. Pt phone number is [phone].

## 2021-07-31 NOTE — Telephone Encounter (Signed)
Lm on vm that Rx sent.

## 2021-07-31 NOTE — Telephone Encounter (Signed)
Oxycodone last filled 05/20/21 for #15, takes q8h PRN, taking for impingement of right shoulder, seen recently for left shoulder pain too. Last OV 06/17/21

## 2021-08-01 NOTE — Telephone Encounter (Signed)
Follow up scheduled

## 2021-08-12 NOTE — Progress Notes (Addendum)
Cardiology Office Note:    Date:  08/31/2021   ID:  [name], DOB [birth year], MRN [id number]  PCP:  [name], MD  Cardiologist:  [name], MD   Referring MD: [name], MD   Chief Complaint  Patient presents with   Coronary Artery Disease   Congestive Heart Failure   Shortness of Breath    History of Present Illness:    [name] is a 76 y.o. female with a hx of  prior obstructive coronary disease with prior stenting, well documented recurrent coronary spasm, recurrent syncope with neurally mediated features, hypertension, hyperlipidemia, tobacco abuse, and gastroesophageal reflux.   [name] is having recumbent chest tightness and dyspnea.  She also has spontaneous episodes of dyspnea with physical activity.  She also still describes this in the context of pressure and tightness in her chest.  A recent nuclear study demonstrated no ischemia but evidence of fixed stenoses.  She denies edema but has had occasional palpitations.  Past Medical History:  Diagnosis Date   Anemia    Anginal pain ([place])    last cp was last year   Anxiety    Arthritis    Blood transfusion    Chest pain 02/04/2016   Coronary artery disease    Coronary artery spasm, wth continued episodes of chest pain.  09/26/2013   Coronary vasospasm (HCC)    Diabetes mellitus without complication (HCC)    type II   GERD (gastroesophageal reflux disease)    Headache    Hiatal hernia    Hypercholesteremia    Hypertension    NSTEMI (non-ST elevated myocardial infarction) ([place]) not sure   NSVT (nonsustained ventricular tachycardia) 09/26/2013   Panic attack     Past Surgical History:  Procedure Laterality Date   ABDOMINAL HYSTERECTOMY     PARTIAL HYSTERECTOMY   breast     right, tumor benign   BREAST EXCISIONAL BIOPSY Right    1961 (age 52) benign   CARDIAC CATHETERIZATION  2014   CARDIAC CATHETERIZATION  2012   CARDIAC CATHETERIZATION N/A 07/22/2016   Procedure: Left Heart  Cath and Coronary Angiography;  Surgeon: Belva Crome, MD;  Location: [facility];  Service: Cardiovascular;  Laterality: N/A;   cardiac stent  2012   to RCA   CHOLECYSTECTOMY N/A 10/02/2017   Procedure: LAPAROSCOPIC CHOLECYSTECTOMY;  Surgeon: Ralene Ok, MD;  Location: [facility];  Service: General;  Laterality: N/A;   CORONARY ANGIOPLASTY     ESOPHAGEAL MANOMETRY N/A 10/29/2016   Procedure: ESOPHAGEAL MANOMETRY (EM);  Surgeon: Garlan Fair, MD;  Location: [facility];  Service: Endoscopy;  Laterality: N/A;   ESOPHAGOGASTRODUODENOSCOPY (EGD) WITH PROPOFOL N/A 10/28/2016   Procedure: ESOPHAGOGASTRODUODENOSCOPY (EGD) WITH PROPOFOL;  Surgeon: Garlan Fair, MD;  Location: [facility];  Service: Endoscopy;  Laterality: N/A;   EXCISION OF SKIN TAG N/A 07/29/2017   Procedure: EXCISION OF PERIANAL  SKIN TAG;  Surgeon: Ralene Ok, MD;  Location: [facility];  Service: General;  Laterality: N/A;   KNEE SURGERY  left   left ear surgery     for bad cut   LEFT HEART CATHETERIZATION WITH CORONARY ANGIOGRAM N/A 02/22/2013   Procedure: LEFT HEART CATHETERIZATION WITH CORONARY ANGIOGRAM;  Surgeon: [name], MD;  Location: [facility];  Service: Cardiovascular;  Laterality: N/A;   SHOULDER SURGERY Bilateral    rotator cuff    Current Medications: Current Meds  Medication Sig   ACCU-CHEK GUIDE test strip USE TO CHECK BLOOD  SUGAR DAILY   Accu-Chek Softclix Lancets lancets daily.   acetaminophen (TYLENOL) 500 MG tablet Take 500 mg by mouth daily as needed for moderate pain or headache.   amLODipine (NORVASC) 2.5 MG tablet Take 1 tablet (2.5 mg total) by mouth daily.   aspirin EC 81 MG tablet Take 81 mg by mouth daily.   buPROPion (ZYBAN) 150 MG 12 hr tablet Take 150 mg by mouth every morning.   dapagliflozin propanediol (FARXIGA) 10 MG TABS tablet Take 1 tablet (10 mg total) by mouth daily before breakfast.   isosorbide mononitrate (IMDUR) 60 MG 24 hr tablet TAKE 1 AND 1/2  TABLETS BY MOUTH DAILY. NEED TO SCHEDULE YEARLY APPOINTMENT FOR FURTHER REFILLS (Patient taking differently: Take 90 mg by mouth every evening.)   metFORMIN (GLUCOPHAGE-XR) 500 MG 24 hr tablet Take 500 mg by mouth 2 (two) times daily.   metoprolol tartrate (LOPRESSOR) 100 MG tablet Take one tablet by mouth 2 hours prior to CT   nitroGLYCERIN (NITROSTAT) 0.4 MG SL tablet PLACE 1 TABLET UNDER TONGUE AS NEEDED FOR CHEST PAIN EVERY 5 MINUTES UP TO 3 DOSES THEN SEEK MEDICAL ATTENTION. Please make overdue appt. 1st attempt (Patient taking differently: Place 0.4 mg under the tongue every 5 (five) minutes as needed for chest pain.)   oxyCODONE-acetaminophen (PERCOCET/ROXICET) 5-325 MG tablet Take 1 tablet by mouth every 8 (eight) hours as needed for severe pain.   pantoprazole (PROTONIX) 40 MG tablet Take 1 tablet (40 mg total) by mouth 2 (two) times daily.   pregabalin (LYRICA) 25 MG capsule Take 25 mg by mouth 2 (two) times daily.   ranitidine (ZANTAC) 150 MG tablet Take 150 mg by mouth 2 (two) times daily.    ranolazine (RANEXA) 500 MG 12 hr tablet TAKE 1 TABLET(500 MG) BY MOUTH TWICE DAILY (Patient taking differently: Take 500 mg by mouth 2 (two) times daily.)   rosuvastatin (CRESTOR) 5 MG tablet Take 1 tablet (5 mg total) by mouth daily at 6 PM. (Patient taking differently: Take 5 mg by mouth daily.)   senna-docusate (SENOKOT-S) 8.6-50 MG tablet Take 1 tablet by mouth at bedtime as needed for mild constipation or moderate constipation. (Patient taking differently: Take 1 tablet by mouth daily as needed for mild constipation or moderate constipation.)   traZODone (DESYREL) 50 MG tablet Take 50 mg by mouth as needed for sleep.     Allergies:   Patient has no known allergies.   Social History   Socioeconomic History   Marital status: Married    Spouse name: [name]   Number of children: 0   Years of education: Not on file   Highest education level: Not on file  Occupational History   Not on file   Tobacco Use   Smoking status: Some Days    Packs/day: 0.50    Years: 40.00    Pack years: 20.00    Types: Cigarettes    Last attempt to quit: 01/10/2016    Years since quitting: 5.6   Smokeless tobacco: Never   Tobacco comments:    occasional   Vaping Use   Vaping Use: Never used  Substance and Sexual Activity   Alcohol use: Yes    Alcohol/week: 1.0 standard drink    Types: 1 Cans of beer per week    Comment: occasional beer   Drug use: No   Sexual activity: Never  Other Topics Concern   Not on file  Social History Narrative   Lives with husband. Ambulates independently.  Social Determinants of Health   Financial Resource Strain: Not on file  Food Insecurity: Not on file  Transportation Needs: Not on file  Physical Activity: Not on file  Stress: Not on file  Social Connections: Not on file     Family History: The patient's family history includes Heart attack in an other family member; Heart failure in her mother; Stroke in her sister.  ROS:   Please see the history of present illness.    She is compliant with medications.  Sublingual nitroglycerin does not help the chest tightness and dyspnea that she develops when recumbent.  All other systems reviewed and are negative.  EKGs/Labs/Other Studies Reviewed:    The following studies were reviewed today: Myocardial perfusion imaging performed on 07/18/2021: Nuclear Stress Findings  Perfusion/Defect LV perfusion is abnormal. There is evidence of infarction. Defect 1: There is a medium defect with severe reduction in uptake present in the apical to basal anterolateral and inferolateral location(s) that is fixed. There is abnormal wall motion in the defect area. Consistent with infarction. The defect is consistent with abnormal perfusion in the LCx territory.  Chamber Size Left ventricular function is abnormal. Nuclear stress EF: 40 %. The left ventricular ejection fraction is moderately decreased (30-44%). End diastolic  cavity size is normal. No evidence of transient ischemic dilation (TID) noted.  Stress Combined Conclusion Findings are consistent with prior myocardial infarction. The study is intermediate risk.    2D Doppler echocardiogram performed 2020: IMPRESSIONS     1. Left ventricular ejection fraction, by visual estimation, is 45 to  50%. The left ventricle has mildly decreased function. Normal left  ventricular size. There is no left ventricular hypertrophy.   2. Entire lateral wall is abnormal.   3. Elevated left ventricular end-diastolic pressure.   4. Left ventricular diastolic Doppler parameters are consistent with  impaired relaxation pattern of LV diastolic filling.   5. Global right ventricle has normal systolic function.The right  ventricular size is normal. No increase in right ventricular wall  thickness.   6. Left atrial size was normal.   7. Right atrial size was normal.   8. The mitral valve is normal in structure. Trace mitral valve  regurgitation. No evidence of mitral stenosis.   9. The tricuspid valve is normal in structure. Tricuspid valve  regurgitation is trivial.  10. The aortic valve is normal in structure. Aortic valve regurgitation  was not visualized by color flow Doppler. Structurally normal aortic  valve, with no evidence of sclerosis or stenosis.  11. The pulmonic valve was normal in structure. Pulmonic valve  regurgitation is not visualized by color flow Doppler.  12. Normal pulmonary artery systolic pressure.  13. The inferior vena cava is normal in size with <50% respiratory  variability, suggesting right atrial pressure of 8 mmHg.   EKG:  EKG not repeated.  EKG done most recently on 06/22/2021 revealed sinus bradycardia with nonspecific T wave abnormality.  Recent Labs: 06/21/2021: ALT 12; B Natriuretic Peptide 30.0; Magnesium 1.6; TSH 1.952 06/22/2021: Hemoglobin 13.2; Platelets 241 08/29/2021: BUN 13; Creatinine, Ser 0.83; Potassium 4.4; Sodium 146   Recent Lipid Panel    Component Value Date/Time   CHOL 155 06/22/2021 0336   TRIG 80 06/22/2021 0336   HDL 75 06/22/2021 0336   CHOLHDL 2.1 06/22/2021 0336   VLDL 16 06/22/2021 0336   LDLCALC 64 06/22/2021 0336    Physical Exam:    VS:  BP 130/64    Pulse 76    Ht  4\' 11"  (1.499 m)    Wt 115 lb 3.2 oz (52.3 kg)    SpO2 98%    BMI 23.27 kg/m     Wt Readings from Last 3 Encounters:  08/14/21 115 lb 3.2 oz (52.3 kg)  07/03/21 114 lb (51.7 kg)  06/21/21 114 lb 11.2 oz (52 kg)     GEN: Appears younger than stated age. No acute distress HEENT: Normal NECK: No JVD. LYMPHATICS: No lymphadenopathy CARDIAC: No murmur. RRR no gallop, or edema. VASCULAR:  Normal Pulses. No bruits. RESPIRATORY:  Clear to auscultation without rales, wheezing or rhonchi  ABDOMEN: Soft, non-tender, non-distended, No pulsatile mass, MUSCULOSKELETAL: No deformity  SKIN: Warm and dry NEUROLOGIC:  Alert and oriented x 3 PSYCHIATRIC:  Normal affect   ASSESSMENT:    1. Coronary artery disease of native artery of native heart with stable angina pectoris ([place])   2. Coronary artery vasospasm (HCC)   3. Dyslipidemia   4. Primary hypertension   5. Precordial pain   6. SOB (shortness of breath)    PLAN:    In order of problems listed above:  Secondary prevention is being practiced.  She is on statin therapy.  She uses sublingual nitroglycerin for episodes of chest tightness with variable response.  Recent nuclear study was intermediate risk due to the finding of 2 areas of fixed defect compatible with prior infarction.  We need structural information rather than putting her through invasive coronary angiography, I will order a coronary CTA to rule out left main disease / proximal LAD disease. Documented coronary vasospasm in the past that was much worse when she used to smoke. Continue statin therapy Excellent control but may need to be readjusted if we find significant reduction in LVEF on  echo. 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  Nuclear suggests a lower ejection fraction.  Start dapagliflozin 10 mg/day.  Add additional components of heart failure regimen based upon clinical data.   Medication Adjustments/Labs and Tests Ordered: Current medicines are reviewed at length with the patient today.  Concerns regarding medicines are outlined above.  Orders Placed This Encounter  Procedures   CT CORONARY MORPH W/CTA COR W/SCORE W/CA W/CM &/OR WO/CM   Basic metabolic panel   ECHOCARDIOGRAM COMPLETE   Meds ordered this encounter  Medications   dapagliflozin propanediol (FARXIGA) 10 MG TABS tablet    Sig: Take 1 tablet (10 mg total) by mouth daily before breakfast.    Dispense:  30 tablet    Refill:  11   metoprolol tartrate (LOPRESSOR) 100 MG tablet    Sig: Take one tablet by mouth 2 hours prior to CT    Dispense:  1 tablet    Refill:  0    Patient Instructions  Medication Instructions:  1) START Farxiga 10mg  once daily  *If you need a refill on your cardiac medications before your next appointment, please call your pharmacy*   Lab Work: BMET in 1-2 weeks  If you have labs (blood work) drawn today and your tests are completely normal, you will receive your results only by: MyChart Message (if you have MyChart) OR A paper copy in the mail If you have any lab test that is abnormal or we need to change your treatment, we will call you to review the results.   Testing/Procedures: Your physician has requested that you have an echocardiogram. Echocardiography is a painless  test that uses sound waves to create images of your heart. It provides your doctor with information about the size and shape of your heart and how well your hearts chambers and valves are working. This procedure takes approximately one  hour. There are no restrictions for this procedure.  Your physician recommends that you have a Coronary CT performed.   Follow-Up: At [facility], you and your health needs are our priority.  As part of our continuing mission to provide you with exceptional heart care, we have created designated Provider Care Teams.  These Care Teams include your primary Cardiologist (physician) and Advanced Practice Providers (APPs -  Physician Assistants and Nurse Practitioners) who all work together to provide you with the care you need, when you need it.  We recommend signing up for the patient portal called "MyChart".  Sign up information is provided on this After Visit Summary.  MyChart is used to connect with patients for Virtual Visits (Telemedicine).  Patients are able to view lab/test results, encounter notes, upcoming appointments, etc.  Non-urgent messages can be sent to your provider as well.   To learn more about what you can do with MyChart, go to [email or web address].    Your next appointment:   6 week(s)- can use 2/15 at 11:40am  The format for your next appointment:   In Person  Provider:   [name], MD     Other Instructions     Your cardiac CT will be scheduled at one of the below locations:   [facility] [address] [place], [place] [address] [phone]  [facility] [address] [place], [place] [address] [phone]  If scheduled at [facility], please arrive at the [facility] main entrance (entrance A) of [facility] 30 minutes prior to test start time. You can use the FREE valet parking offered at the main entrance (encouraged to control the heart rate for the test) Proceed to the [facility] Radiology Department (first floor) to check-in and test prep.  If scheduled at [facility], please arrive 15 mins early for check-in and test  prep.  Please follow these instructions carefully (unless otherwise directed):   On the Night Before the Test: Be sure to Drink plenty of water. Do not consume any caffeinated/decaffeinated beverages or chocolate 12 hours prior to your test. Do not take any antihistamines 12 hours prior to your test.  On the Day of the Test: Drink plenty of water until 1 hour prior to the test. Do not eat any food 4 hours prior to the test. You may take your regular medications prior to the test.  Take metoprolol (Lopressor) two hours prior to test. HOLD Furosemide/Hydrochlorothiazide morning of the test. FEMALES- please wear underwire-free bra if available, avoid dresses & tight clothing        After the Test: Drink plenty of water. After receiving IV contrast, you may experience a mild flushed feeling. This is normal. On occasion, you may experience a mild rash up to 24 hours after the test. This is not dangerous. If this occurs, you can take Benadryl 25 mg and increase your fluid intake. If you experience trouble breathing, this can be serious. If it is severe call 911 IMMEDIATELY. If it is mild, please call our office. If you take any of these medications: Glipizide/Metformin, Avandament, Glucavance, please do not take 48 hours after completing test unless otherwise instructed.  Please  allow 2-4 weeks for scheduling of routine cardiac CTs. Some insurance companies require a pre-authorization which may delay scheduling of this test.   For non-scheduling related questions, please contact the cardiac imaging nurse navigator should you have any questions/concerns: Marchia Bond, Cardiac Imaging Nurse Navigator Gordy Clement, Cardiac Imaging Nurse Navigator [facility] Direct Office Dial: [phone]   For scheduling needs, including cancellations and rescheduling, please call [place], [phone].    Signed, [name], MD  08/31/2021 11:01 AM    [place]

## 2021-08-14 ENCOUNTER — Other Ambulatory Visit: Payer: Self-pay

## 2021-08-14 ENCOUNTER — Encounter: Payer: Self-pay | Admitting: Interventional Cardiology

## 2021-08-14 ENCOUNTER — Ambulatory Visit: Payer: Medicare Other | Admitting: Interventional Cardiology

## 2021-08-14 VITALS — BP 130/64 | HR 76 | Ht 59.0 in | Wt 115.2 lb

## 2021-08-14 DIAGNOSIS — R072 Precordial pain: Secondary | ICD-10-CM

## 2021-08-14 DIAGNOSIS — I25118 Atherosclerotic heart disease of native coronary artery with other forms of angina pectoris: Secondary | ICD-10-CM | POA: Diagnosis not present

## 2021-08-14 DIAGNOSIS — E785 Hyperlipidemia, unspecified: Secondary | ICD-10-CM | POA: Diagnosis not present

## 2021-08-14 DIAGNOSIS — I10 Essential (primary) hypertension: Secondary | ICD-10-CM | POA: Diagnosis not present

## 2021-08-14 DIAGNOSIS — R0602 Shortness of breath: Secondary | ICD-10-CM | POA: Diagnosis not present

## 2021-08-14 DIAGNOSIS — I201 Angina pectoris with documented spasm: Secondary | ICD-10-CM | POA: Diagnosis not present

## 2021-08-14 MED ORDER — METOPROLOL TARTRATE 100 MG PO TABS: ORAL_TABLET | ORAL | 0 refills | Status: DC

## 2021-08-14 MED ORDER — DAPAGLIFLOZIN PROPANEDIOL 10 MG PO TABS: 10.0000 mg | ORAL_TABLET | Freq: Every day | ORAL | 11 refills | Status: DC

## 2021-08-14 NOTE — Patient Instructions (Addendum)
Medication Instructions:  1) START Farxiga 10mg  once daily  *If you need a refill on your cardiac medications before your next appointment, please call your pharmacy*   Lab Work: BMET in 1-2 weeks  If you have labs (blood work) drawn today and your tests are completely normal, you will receive your results only by: [place] (if you have MyChart) OR A paper copy in the mail If you have any lab test that is abnormal or we need to change your treatment, we will call you to review the results.   Testing/Procedures: Your physician has requested that you have an echocardiogram. Echocardiography is a painless test that uses sound waves to create images of your heart. It provides your doctor with information about the size and shape of your heart and how well your hearts chambers and valves are working. This procedure takes approximately one hour. There are no restrictions for this procedure.  Your physician recommends that you have a Coronary CT performed.   Follow-Up: At [facility], you and your health needs are our priority.  As part of our continuing mission to provide you with exceptional heart care, we have created designated Provider Care Teams.  These Care Teams include your primary Cardiologist (physician) and Advanced Practice Providers (APPs -  Physician Assistants and Nurse Practitioners) who all work together to provide you with the care you need, when you need it.  We recommend signing up for the patient portal called "MyChart".  Sign up information is provided on this After Visit Summary.  MyChart is used to connect with patients for Virtual Visits (Telemedicine).  Patients are able to view lab/test results, encounter notes, upcoming appointments, etc.  Non-urgent messages can be sent to your provider as well.   To learn more about what you can do with MyChart, go to [email or web address].    Your next appointment:   6 week(s)- can use 2/15 at 11:40am  The format  for your next appointment:   In Person  Provider:   [name], MD     Other Instructions     Your cardiac CT will be scheduled at one of the below locations:   [facility] [address] [place], [place] [address] [phone]  [facility] [address] [place], [place] [address] [phone]  If scheduled at [facility], please arrive at the [facility] main entrance (entrance A) of [facility] 30 minutes prior to test start time. You can use the FREE valet parking offered at the main entrance (encouraged to control the heart rate for the test) Proceed to the [facility] Radiology Department (first floor) to check-in and test prep.  If scheduled at [facility], please arrive 15 mins early for check-in and test prep.  Please follow these instructions carefully (unless otherwise directed):   On the Night Before the Test: Be sure to Drink plenty of water. Do not consume any caffeinated/decaffeinated beverages or chocolate 12 hours prior to your test. Do not take any antihistamines 12 hours prior to your test.  On the Day of the Test: Drink plenty of water until 1 hour prior to the test. Do not eat any food 4 hours prior to the test. You may take your regular medications prior to the test.  Take metoprolol (Lopressor) two hours prior to test. HOLD Furosemide/Hydrochlorothiazide morning of the test. FEMALES- please wear underwire-free bra if available, avoid dresses & tight clothing  After the Test: Drink plenty of water. After receiving IV contrast, you may experience a mild flushed feeling. This is normal. On occasion, you may experience a mild rash up to 24 hours after the test. This is not dangerous. If this occurs, you can take Benadryl 25 mg and increase your fluid intake. If you experience trouble breathing, this can be serious. If it is severe  call 911 IMMEDIATELY. If it is mild, please call our office. If you take any of these medications: Glipizide/Metformin, Avandament, Glucavance, please do not take 48 hours after completing test unless otherwise instructed.  Please allow 2-4 weeks for scheduling of routine cardiac CTs. Some insurance companies require a pre-authorization which may delay scheduling of this test.   For non-scheduling related questions, please contact the cardiac imaging nurse navigator should you have any questions/concerns: Marchia Bond, Cardiac Imaging Nurse Navigator Gordy Clement, Cardiac Imaging Nurse Navigator [facility] Direct Office Dial: [phone]   For scheduling needs, including cancellations and rescheduling, please call [place], [phone].

## 2021-08-29 ENCOUNTER — Other Ambulatory Visit: Payer: Medicare Other

## 2021-08-29 ENCOUNTER — Ambulatory Visit (HOSPITAL_COMMUNITY): Payer: Medicare Other | Attending: Cardiology

## 2021-08-29 ENCOUNTER — Encounter (INDEPENDENT_AMBULATORY_CARE_PROVIDER_SITE_OTHER): Payer: Self-pay

## 2021-08-29 ENCOUNTER — Other Ambulatory Visit: Payer: Self-pay

## 2021-08-29 DIAGNOSIS — R0602 Shortness of breath: Secondary | ICD-10-CM

## 2021-08-29 DIAGNOSIS — R072 Precordial pain: Secondary | ICD-10-CM | POA: Diagnosis not present

## 2021-08-29 LAB — BASIC METABOLIC PANEL
BUN/Creatinine Ratio: 16 (ref 12–28)
BUN: 13 mg/dL (ref 8–27)
CO2: 25 mmol/L (ref 20–29)
Calcium: 10.5 mg/dL — ABNORMAL HIGH (ref 8.7–10.3)
Chloride: 102 mmol/L (ref 96–106)
Creatinine, Ser: 0.83 mg/dL (ref 0.57–1.00)
Glucose: 104 mg/dL — ABNORMAL HIGH (ref 70–99)
Potassium: 4.4 mmol/L (ref 3.5–5.2)
Sodium: 146 mmol/L — ABNORMAL HIGH (ref 134–144)
eGFR: 73 mL/min/{1.73_m2} (ref >59)

## 2021-08-29 LAB — ECHOCARDIOGRAM COMPLETE
Area-P 1/2: 2.82 cm2
S' Lateral: 3.5 cm

## 2021-08-31 ENCOUNTER — Encounter: Payer: Self-pay | Admitting: Interventional Cardiology

## 2021-09-03 ENCOUNTER — Ambulatory Visit: Payer: Medicare Other | Admitting: Family

## 2021-09-03 ENCOUNTER — Encounter: Payer: Self-pay | Admitting: Family

## 2021-09-03 ENCOUNTER — Telehealth (HOSPITAL_COMMUNITY): Payer: Self-pay | Admitting: Emergency Medicine

## 2021-09-03 ENCOUNTER — Ambulatory Visit: Payer: Self-pay

## 2021-09-03 DIAGNOSIS — M79601 Pain in right arm: Secondary | ICD-10-CM

## 2021-09-03 DIAGNOSIS — M79602 Pain in left arm: Secondary | ICD-10-CM | POA: Diagnosis not present

## 2021-09-03 DIAGNOSIS — R200 Anesthesia of skin: Secondary | ICD-10-CM | POA: Diagnosis not present

## 2021-09-03 DIAGNOSIS — R202 Paresthesia of skin: Secondary | ICD-10-CM | POA: Diagnosis not present

## 2021-09-03 MED ORDER — PREDNISONE 50 MG PO TABS: ORAL_TABLET | ORAL | 0 refills | Status: DC

## 2021-09-03 NOTE — Progress Notes (Signed)
Office Visit Note   Patient: [name]           Date of Birth: 07-17-1946           MRN: [id number] Visit Date: 09/03/2021              Requested by: [name], MD [place] [employer] [place] [address],  [place] [address] PCP: [name], MD  Chief Complaint  Patient presents with   Right Arm - Pain   Left Arm - Pain      HPI: The patient is a 76 year old woman who presents today complaining of pain radiating from her anterior shoulder down her biceps and she is now having some associated numbness in her fingers she states both shoulders hurt now and she has radiation of the pain bilateral arms.  She feels associated loss of range of motion unable to reach her arms up above her head pain with behind back reaching  She is status post cervical fusion with Dr. Sherwood Gambler  Assessment & Plan: Visit Diagnoses:  1. Bilateral arm pain   2. Numbness and tingling of right arm     Plan: We will trial her on a course of prednisone.  If we cannot get any relief with this I would like her to follow-up with neurosurgery  Follow-Up Instructions: No follow-ups on file.   Back Exam   Tenderness  The patient is experiencing tenderness in the cervical.   Right Hand Exam   Muscle Strength  The patient has normal right wrist strength.   Left Hand Exam   Muscle Strength  The patient has normal left wrist strength.   Right Shoulder Exam   Tenderness  The patient is experiencing no tenderness.  Tests  Impingement: positive Drop arm: negative     Patient is alert, oriented, no adenopathy, well-dressed, normal affect, normal respiratory effort. Negative spurling  Imaging: No results found. No images are attached to the encounter.  Labs: Lab Results  Component Value Date   HGBA1C 6.6 (H) 06/21/2021   HGBA1C 6.4 (H) 10/01/2017   HGBA1C 6.7 (H) 07/29/2017   ESRSEDRATE 9 08/29/2007   REPTSTATUS 10/31/2020 FINAL 10/29/2020   CULT MULTIPLE SPECIES PRESENT,  SUGGEST RECOLLECTION (A) 10/29/2020     Lab Results  Component Value Date   ALBUMIN 3.1 (L) 06/21/2021   ALBUMIN 3.8 10/29/2020   ALBUMIN 3.2 (L) 02/04/2016    Lab Results  Component Value Date   MG 1.6 (L) 06/21/2021   MG 2.0 01/24/2017   MG 2.1 09/21/2013   No results found for: VD25OH  No results found for: PREALBUMIN CBC EXTENDED Latest Ref Rng & Units 06/22/2021 06/21/2021 06/21/2021  WBC 4.0 - 10.5 K/uL 5.7 6.1 5.8  RBC 3.87 - 5.11 MIL/uL 4.27 4.37 4.57  HGB 12.0 - 15.0 g/dL 13.2 13.6 14.4  HCT 36.0 - 46.0 % 40.2 41.2 42.9  PLT 150 - 400 K/uL 241 252 271  NEUTROABS 1.7 - 7.7 K/uL - - -  LYMPHSABS 0.7 - 4.0 K/uL - - -     There is no height or weight on file to calculate BMI.  Orders:  Orders Placed This Encounter  Procedures   XR Cervical Spine 2 or 3 views   MR Cervical Spine w/o contrast   Ambulatory referral to Physical Medicine Rehab   Meds ordered this encounter  Medications   predniSONE (DELTASONE) 50 MG tablet    Sig: Take one tablet by mouth once daily for 5 days.  Dispense:  5 tablet    Refill:  0     Procedures: No procedures performed  Clinical Data: No additional findings.  ROS:  All other systems negative, except as noted in the HPI. Review of Systems  Constitutional:  Negative for chills and fever.  Musculoskeletal:  Positive for neck pain.  Neurological:  Positive for numbness. Negative for weakness.   Objective: Vital Signs: There were no vitals taken for this visit.  Specialty Comments:  No specialty comments available.  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  09/26/2013   Coronary vasospasm ([facility])    Diabetes mellitus without complication ([facility])    type II   GERD (gastroesophageal reflux disease)    Headache    Hiatal hernia    Hypercholesteremia    Hypertension    NSTEMI (non-ST elevated myocardial infarction) ([place]) not sure   NSVT (nonsustained ventricular tachycardia) 09/26/2013   Panic attack     Family History  Problem Relation Age of Onset   Heart failure Mother    Stroke Sister    Heart attack Other     Past Surgical History:  Procedure Laterality Date   ABDOMINAL HYSTERECTOMY     PARTIAL HYSTERECTOMY   breast     right, tumor benign   BREAST EXCISIONAL BIOPSY Right    46 (age 25) benign   CARDIAC CATHETERIZATION  2014   CARDIAC CATHETERIZATION  2012   CARDIAC CATHETERIZATION N/A 07/22/2016   Procedure: Left Heart Cath and Coronary Angiography;  Surgeon: Belva Crome, MD;  Location: [facility];  Service: Cardiovascular;  Laterality: N/A;   cardiac stent  2012   to RCA   CHOLECYSTECTOMY N/A 10/02/2017   Procedure: LAPAROSCOPIC CHOLECYSTECTOMY;  Surgeon: Ralene Ok, MD;  Location: [facility];  Service: General;  Laterality: N/A;   CORONARY ANGIOPLASTY     ESOPHAGEAL MANOMETRY N/A 10/29/2016   Procedure: ESOPHAGEAL MANOMETRY (EM);  Surgeon: Garlan Fair, MD;  Location: [facility];  Service: Endoscopy;  Laterality: N/A;   ESOPHAGOGASTRODUODENOSCOPY (EGD) WITH PROPOFOL N/A 10/28/2016   Procedure: ESOPHAGOGASTRODUODENOSCOPY (EGD) WITH PROPOFOL;  Surgeon: Garlan Fair, MD;  [facility];  Service: Endoscopy;   Laterality: N/A;   EXCISION OF SKIN TAG N/A 07/29/2017   Procedure: EXCISION OF PERIANAL  SKIN TAG;  Surgeon: Ralene Ok, MD;  Location: [facility];  Service: General;  Laterality: N/A;   KNEE SURGERY  left   left ear surgery     for bad cut   LEFT HEART CATHETERIZATION WITH CORONARY ANGIOGRAM N/A 02/22/2013   Procedure: LEFT HEART CATHETERIZATION WITH CORONARY ANGIOGRAM;  Surgeon: [name], MD;  [facility];  Service: Cardiovascular;  Laterality: N/A;   SHOULDER SURGERY Bilateral    rotator cuff   Social History   Occupational History   Not on file  Tobacco Use   Smoking status: Some Days    Packs/day: 0.50    Years: 40.00    Pack years: 20.00    Types: Cigarettes    Last attempt to quit: 01/10/2016    Years since quitting: 5.6   Smokeless tobacco: Never   Tobacco comments:    occasional   Vaping Use   Vaping Use: Never used  Substance and Sexual Activity   Alcohol use: Yes    Alcohol/week: 1.0 standard drink    Types: 1 Cans of beer per week    Comment: occasional beer   Drug use: No   Sexual activity: Never

## 2021-09-03 NOTE — Telephone Encounter (Signed)
Reaching out to patient to offer assistance regarding upcoming cardiac imaging study; pt verbalizes understanding of appt date/time, parking situation and where to check in, pre-test NPO status and medications ordered, and verified current allergies; name and call back number provided for further questions should they arise Marchia Bond RN Navigator Cardiac Imaging [facility] [phone] office [phone] cell  Arrival 900 100mg  metoprolol tartrate  Stent to the RCA so primary focus of exam to eval the left system

## 2021-09-03 NOTE — Progress Notes (Incomplete)
Office Visit Note   Patient: [name]           Date of Birth: 04-29-1946           MRN: [id number] Visit Date: 09/03/2021              Requested by: [name], MD [place] [employer] [place],  [place] [address] PCP: [name], MD  Chief Complaint  Patient presents with   Right Arm - Pain   Left Arm - Pain      HPI: ***  Assessment & Plan: Visit Diagnoses:  1. Bilateral arm pain   2. Numbness and tingling of right arm     Plan: ***  Follow-Up Instructions: No follow-ups on file.   Back Exam   Tenderness  The patient is experiencing tenderness in the cervical.  Muscle Strength  The patient has normal back strength.  Comments:  + spurling     Patient is alert, oriented, no adenopathy, well-dressed, normal affect, normal respiratory effort. ***  Imaging: No results found. No images are attached to the encounter.  Labs: Lab Results  Component Value Date   HGBA1C 6.6 (H) 06/21/2021   HGBA1C 6.4 (H) 10/01/2017   HGBA1C 6.7 (H) 07/29/2017   ESRSEDRATE 9 08/29/2007   REPTSTATUS 10/31/2020 FINAL 10/29/2020   CULT MULTIPLE SPECIES PRESENT, SUGGEST RECOLLECTION (A) 10/29/2020     Lab Results  Component Value Date   ALBUMIN 3.1 (L) 06/21/2021   ALBUMIN 3.8 10/29/2020   ALBUMIN 3.2 (L) 02/04/2016    Lab Results  Component Value Date   MG 1.6 (L) 06/21/2021   MG 2.0 01/24/2017   MG 2.1 09/21/2013   No results found for: VD25OH  No results found for: PREALBUMIN CBC EXTENDED Latest Ref Rng & Units 06/22/2021 06/21/2021 06/21/2021  WBC 4.0 - 10.5 K/uL 5.7 6.1 5.8  RBC 3.87 - 5.11 MIL/uL 4.27 4.37 4.57  HGB 12.0 - 15.0 g/dL 13.2 13.6 14.4  HCT 36.0 - 46.0 % 40.2 41.2 42.9  PLT 150 - 400 K/uL 241 252 271  NEUTROABS 1.7 - 7.7 K/uL - - -  LYMPHSABS 0.7 - 4.0 K/uL - - -     There is no height or weight on file to calculate BMI.  Orders:  Orders Placed This Encounter  Procedures   XR Cervical Spine 2 or 3 views    No orders of the defined types were placed in this encounter.    Procedures: No procedures performed  Clinical Data: No additional findings.  ROS:  All other systems negative, except as noted in the HPI. Review of Systems  Objective: Vital Signs: There were no vitals taken for this visit.  Specialty Comments:  No specialty comments available.  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  09/26/2013   Coronary vasospasm ([facility])    Diabetes mellitus without complication ([facility])    type II   GERD (gastroesophageal reflux disease)    Headache    Hiatal hernia    Hypercholesteremia    Hypertension    NSTEMI (non-ST elevated myocardial infarction) ([place]) not sure   NSVT (nonsustained ventricular tachycardia) 09/26/2013   Panic attack     Family History  Problem Relation Age of Onset   Heart failure Mother    Stroke Sister    Heart attack Other     Past Surgical History:  Procedure Laterality Date   ABDOMINAL HYSTERECTOMY     PARTIAL HYSTERECTOMY   breast     right, tumor benign   BREAST EXCISIONAL BIOPSY Right    35 (age 83) benign   CARDIAC CATHETERIZATION  2014   CARDIAC  CATHETERIZATION  2012   CARDIAC CATHETERIZATION N/A 07/22/2016   Procedure: Left Heart Cath and Coronary Angiography;  Surgeon: Belva Crome, MD;  Location: [facility];  Service: Cardiovascular;  Laterality: N/A;   cardiac stent  2012   to RCA   CHOLECYSTECTOMY N/A 10/02/2017   Procedure: LAPAROSCOPIC CHOLECYSTECTOMY;  Surgeon: Ralene Ok, MD;  Location: [facility];  Service: General;  Laterality: N/A;   CORONARY ANGIOPLASTY     ESOPHAGEAL MANOMETRY N/A 10/29/2016   Procedure: ESOPHAGEAL MANOMETRY (EM);  Surgeon: Garlan Fair, MD;  [facility];  Service: Endoscopy;  Laterality: N/A;   ESOPHAGOGASTRODUODENOSCOPY (EGD) WITH PROPOFOL N/A 10/28/2016   Procedure: ESOPHAGOGASTRODUODENOSCOPY (EGD) WITH PROPOFOL;  Surgeon: Garlan Fair, MD;  [facility];  Service: Endoscopy;  Laterality: N/A;   EXCISION OF SKIN TAG N/A 07/29/2017   Procedure: EXCISION OF PERIANAL  SKIN TAG;  Surgeon: Ralene Ok, MD;  Location: [facility];  Service: General;  Laterality: N/A;   KNEE SURGERY  left   left ear surgery     for bad cut   LEFT HEART CATHETERIZATION WITH CORONARY ANGIOGRAM N/A 02/22/2013   Procedure: LEFT HEART CATHETERIZATION WITH CORONARY ANGIOGRAM;  Surgeon: [name], MD;  Location: [facility];  Service: Cardiovascular;  Laterality: N/A;   SHOULDER SURGERY Bilateral    rotator cuff   Social History   Occupational History   Not on file  Tobacco Use   Smoking status: Some Days    Packs/day: 0.50    Years: 40.00    Pack years: 20.00    Types: Cigarettes    Last attempt to quit: 01/10/2016    Years since quitting: 5.6   Smokeless tobacco: Never   Tobacco comments:    occasional   Vaping Use   Vaping Use: Never used  Substance and Sexual Activity   Alcohol use: Yes    Alcohol/week: 1.0 standard drink    Types: 1 Cans of beer per week    Comment: occasional beer   Drug use: No   Sexual activity: Never

## 2021-09-04 ENCOUNTER — Telehealth: Payer: Self-pay | Admitting: Family

## 2021-09-04 NOTE — Telephone Encounter (Signed)
Pt called stating that the pharmacy states they haven't gotten the refill of prednisone. Please resend and call pt when resent [phone].

## 2021-09-04 NOTE — Telephone Encounter (Signed)
[name] confirmed she has the prednisone. She thought she was told percocet by mistake.

## 2021-09-04 NOTE — Telephone Encounter (Signed)
This rx was sent yesterday. Can you confirm the pt's pharm and that is went ot the right place please?

## 2021-09-05 ENCOUNTER — Ambulatory Visit (HOSPITAL_COMMUNITY)
Admission: RE | Admit: 2021-09-05 | Discharge: 2021-09-05 | Disposition: A | Discharge status: Home / Self Care | Payer: Medicare Other | Source: Ambulatory Visit | Attending: Interventional Cardiology | Admitting: Interventional Cardiology

## 2021-09-05 ENCOUNTER — Other Ambulatory Visit: Payer: Self-pay

## 2021-09-05 ENCOUNTER — Encounter (HOSPITAL_COMMUNITY): Payer: Self-pay

## 2021-09-05 DIAGNOSIS — R072 Precordial pain: Secondary | ICD-10-CM

## 2021-09-05 DIAGNOSIS — I700 Atherosclerosis of aorta: Secondary | ICD-10-CM | POA: Diagnosis not present

## 2021-09-05 MED ORDER — NITROGLYCERIN 0.4 MG SL SUBL
SUBLINGUAL_TABLET | SUBLINGUAL | Status: AC
  Filled 2021-09-05: qty 2

## 2021-09-05 MED ORDER — NITROGLYCERIN 0.4 MG SL SUBL
0.8000 mg | SUBLINGUAL_TABLET | Freq: Once | SUBLINGUAL | Status: AC
  Administered 2021-09-05: 0.8 mg via SUBLINGUAL

## 2021-09-05 MED ORDER — IOHEXOL 350 MG/ML SOLN
95.0000 mL | Freq: Once | INTRAVENOUS | Status: AC | PRN
  Administered 2021-09-05: 95 mL via INTRAVENOUS

## 2021-09-09 ENCOUNTER — Telehealth: Payer: Self-pay | Admitting: Interventional Cardiology

## 2021-09-09 NOTE — Telephone Encounter (Signed)
Pt advised and verbalized understanding of her Cardiac CT results.

## 2021-09-09 NOTE — Telephone Encounter (Signed)
Patient is returning call to discuss CT results. 

## 2021-09-16 ENCOUNTER — Ambulatory Visit
Admission: RE | Admit: 2021-09-16 | Discharge: 2021-09-16 | Disposition: A | Discharge status: Home / Self Care | Payer: Medicare Other | Source: Ambulatory Visit | Attending: Family | Admitting: Family

## 2021-09-16 DIAGNOSIS — R200 Anesthesia of skin: Secondary | ICD-10-CM

## 2021-09-16 DIAGNOSIS — M79602 Pain in left arm: Secondary | ICD-10-CM

## 2021-09-16 DIAGNOSIS — M4312 Spondylolisthesis, cervical region: Secondary | ICD-10-CM | POA: Diagnosis not present

## 2021-09-16 DIAGNOSIS — M4802 Spinal stenosis, cervical region: Secondary | ICD-10-CM | POA: Diagnosis not present

## 2021-09-16 DIAGNOSIS — M5412 Radiculopathy, cervical region: Secondary | ICD-10-CM | POA: Diagnosis not present

## 2021-09-16 DIAGNOSIS — M79601 Pain in right arm: Secondary | ICD-10-CM

## 2021-09-19 ENCOUNTER — Ambulatory Visit: Payer: Medicare Other | Admitting: Orthopedic Surgery

## 2021-09-22 NOTE — Progress Notes (Signed)
Cardiology Office Note:    Date:  09/25/2021   ID:  [name], DOB 1945/09/16, MRN [id number]  PCP:  [name], MD  Cardiologist:  [name], MD   Referring MD: [name], MD   Chief Complaint  Patient presents with   Coronary Artery Disease    History of Present Illness:    [name] is a 76 y.o. female with a hx of prior obstructive coronary disease with prior stenting, well documented recurrent coronary spasm, recurrent syncope with neurally mediated features, hypertension, hyperlipidemia, tobacco abuse, and gastroesophageal reflux.    [name] is doing okay.  She is back today to discuss findings from recent work-up.  No obstructive disease was identified.  She continues to have spontaneous episodes of chest discomfort that can last up to 60 minutes.  Nitro has a variable impact.  When the chest is hurting she gets diaphoretic.  No radiation of the discomfort.  These episodes of discomfort have occurred off and on over the past 25 years.  Frequency of episodes has decreased significantly since she stopped smoking.  Past Medical History:  Diagnosis Date   Anemia    Anginal pain ([place])    last cp was last year   Anxiety    Arthritis    Blood transfusion    Chest pain 02/04/2016   Coronary artery disease    Coronary artery spasm, wth continued episodes of chest pain.  09/26/2013   Coronary vasospasm (HCC)    Diabetes mellitus without complication (HCC)    type II   GERD (gastroesophageal reflux disease)    Headache    Hiatal hernia    Hypercholesteremia    Hypertension    NSTEMI (non-ST elevated myocardial infarction) ([place]) not sure   NSVT (nonsustained ventricular tachycardia) 09/26/2013   Panic attack     Past Surgical History:  Procedure Laterality Date   ABDOMINAL HYSTERECTOMY     PARTIAL HYSTERECTOMY   breast     right, tumor benign   BREAST EXCISIONAL BIOPSY Right    1961 (age 89) benign   CARDIAC CATHETERIZATION  2014   CARDIAC  CATHETERIZATION  2012   CARDIAC CATHETERIZATION N/A 07/22/2016   Procedure: Left Heart Cath and Coronary Angiography;  Surgeon: Belva Crome, MD;  Location: [place] [facility];  Service: Cardiovascular;  Laterality: N/A;   cardiac stent  2012   to RCA   CHOLECYSTECTOMY N/A 10/02/2017   Procedure: LAPAROSCOPIC CHOLECYSTECTOMY;  Surgeon: Ralene Ok, MD;  Location: [facility];  Service: General;  Laterality: N/A;   CORONARY ANGIOPLASTY     ESOPHAGEAL MANOMETRY N/A 10/29/2016   Procedure: ESOPHAGEAL MANOMETRY (EM);  Surgeon: Garlan Fair, MD;  [facility];  Service: Endoscopy;  Laterality: N/A;   ESOPHAGOGASTRODUODENOSCOPY (EGD) WITH PROPOFOL N/A 10/28/2016   Procedure: ESOPHAGOGASTRODUODENOSCOPY (EGD) WITH PROPOFOL;  Surgeon: Garlan Fair, MD;  [facility];  Service: Endoscopy;  Laterality: N/A;   EXCISION OF SKIN TAG N/A 07/29/2017   Procedure: EXCISION OF PERIANAL  SKIN TAG;  Surgeon: Ralene Ok, MD;  Location: [facility];  Service: General;  Laterality: N/A;   KNEE SURGERY  left   left ear surgery     for bad cut   LEFT HEART CATHETERIZATION WITH CORONARY ANGIOGRAM N/A 02/22/2013   Procedure: LEFT HEART CATHETERIZATION WITH CORONARY ANGIOGRAM;  Surgeon: [name], MD;  Location: [facility];  Service: Cardiovascular;  Laterality: N/A;   SHOULDER SURGERY Bilateral    rotator cuff  Current Medications: Current Meds  Medication Sig   ACCU-CHEK GUIDE test strip USE TO CHECK BLOOD SUGAR DAILY   Accu-Chek Softclix Lancets lancets daily.   acetaminophen (TYLENOL) 500 MG tablet Take 500 mg by mouth daily as needed for moderate pain or headache.   amLODipine (NORVASC) 2.5 MG tablet Take 1 tablet (2.5 mg total) by mouth daily.   aspirin EC 81 MG tablet Take 81 mg by mouth daily.   dapagliflozin propanediol (FARXIGA) 10 MG TABS tablet Take 1 tablet (10 mg total) by mouth daily before breakfast.   metFORMIN (GLUCOPHAGE-XR) 500 MG 24 hr tablet Take 500 mg  by mouth 2 (two) times daily.   nitroGLYCERIN (NITROSTAT) 0.4 MG SL tablet PLACE 1 TABLET UNDER TONGUE AS NEEDED FOR CHEST PAIN EVERY 5 MINUTES UP TO 3 DOSES THEN SEEK MEDICAL ATTENTION. Please make overdue appt. 1st attempt   oxyCODONE-acetaminophen (PERCOCET/ROXICET) 5-325 MG tablet Take 1 tablet by mouth every 8 (eight) hours as needed for severe pain.   pantoprazole (PROTONIX) 40 MG tablet Take 1 tablet (40 mg total) by mouth 2 (two) times daily.   ranitidine (ZANTAC) 150 MG tablet Take 150 mg by mouth 2 (two) times daily.    ranolazine (RANEXA) 500 MG 12 hr tablet TAKE 1 TABLET(500 MG) BY MOUTH TWICE DAILY (Patient taking differently: Take 500 mg by mouth 2 (two) times daily.)   rosuvastatin (CRESTOR) 5 MG tablet Take 1 tablet (5 mg total) by mouth daily at 6 PM. (Patient taking differently: Take 5 mg by mouth daily.)   senna-docusate (SENOKOT-S) 8.6-50 MG tablet Take 1 tablet by mouth at bedtime as needed for mild constipation or moderate constipation. (Patient taking differently: Take 1 tablet by mouth daily as needed for mild constipation or moderate constipation.)   temazepam (RESTORIL) 15 MG capsule Take 15 mg by mouth daily as needed for sleep.   traZODone (DESYREL) 50 MG tablet Take 50 mg by mouth as needed for sleep.   [DISCONTINUED] isosorbide mononitrate (IMDUR) 60 MG 24 hr tablet TAKE 1 AND 1/2 TABLETS BY MOUTH DAILY. NEED TO SCHEDULE YEARLY APPOINTMENT FOR FURTHER REFILLS     Allergies:   Patient has no known allergies.   Social History   Socioeconomic History   Marital status: Married    Spouse name: [name]   Number of children: 0   Years of education: Not on file   Highest education level: Not on file  Occupational History   Not on file  Tobacco Use   Smoking status: Some Days    Packs/day: 0.50    Years: 40.00    Pack years: 20.00    Types: Cigarettes    Last attempt to quit: 01/10/2016    Years since quitting: 5.7   Smokeless tobacco: Never   Tobacco comments:     occasional   Vaping Use   Vaping Use: Never used  Substance and Sexual Activity   Alcohol use: Yes    Alcohol/week: 1.0 standard drink    Types: 1 Cans of beer per week    Comment: occasional beer   Drug use: No   Sexual activity: Never  Other Topics Concern   Not on file  Social History Narrative   Lives with husband. Ambulates independently.   Social Determinants of Health   Financial Resource Strain: Not on file  Food Insecurity: Not on file  Transportation Needs: Not on file  Physical Activity: Not on file  Stress: Not on file  Social Connections: Not on file  Family History: The patient's family history includes Heart attack in an other family member; Heart failure in her mother; Stroke in her sister.  ROS:   Please see the history of present illness.    No complaints otherwise.  She has esophageal disease.  Some discussion about esophageal dilatation has occurred.  She is not having dysphagia.  She is having no medication side effects.  Isosorbide causes headache.  All other systems reviewed and are negative.  EKGs/Labs/Other Studies Reviewed:    The following studies were reviewed today:  CORONARY CTA 09/05/2021: IMPRESSION: 1. 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 Presence of coronary stent in mid RCA.   2. Coronary calcium score of 408. This was 66 percentile for age and sex matched control.   3. Normal coronary origin with right dominance.   4.  Aortic atherosclerosis.   5. Wall motion evaluated through 35-75% of RR interval. Focal hypokinesis in the mid inferior, inferolateral, and anterolateral wall. Recommend correlation with other imaging modality.  EKG:  EKG not performed.  Recent Labs: 06/21/2021: ALT 12; B Natriuretic Peptide 30.0; Magnesium 1.6; TSH 1.952 06/22/2021: Hemoglobin 13.2; Platelets 241 08/29/2021: BUN 13; Creatinine, Ser 0.83; Potassium 4.4; Sodium 146  Recent Lipid Panel    Component Value Date/Time   CHOL 155 06/22/2021  0336   TRIG 80 06/22/2021 0336   HDL 75 06/22/2021 0336   CHOLHDL 2.1 06/22/2021 0336   VLDL 16 06/22/2021 0336   LDLCALC 64 06/22/2021 0336    Physical Exam:    VS:  BP 112/62    Pulse 69    Ht 4\' 11"  (1.499 m)    Wt 113 lb (51.3 kg)    SpO2 99%    BMI 22.82 kg/m     Wt Readings from Last 3 Encounters:  09/25/21 113 lb (51.3 kg)  08/14/21 115 lb 3.2 oz (52.3 kg)  07/03/21 114 lb (51.7 kg)     GEN: Appears younger than stated age.. No acute distress HEENT: Normal NECK: No JVD. LYMPHATICS: No lymphadenopathy CARDIAC: 1/6 right upper sternal systolic murmur. RRR no gallop, or edema. VASCULAR:  Normal Pulses. No bruits. RESPIRATORY:  Clear to auscultation without rales, wheezing or rhonchi  ABDOMEN: Soft, non-tender, non-distended, No pulsatile mass, MUSCULOSKELETAL: No deformity  SKIN: Warm and dry NEUROLOGIC:  Alert and oriented x 3 PSYCHIATRIC:  Normal affect   ASSESSMENT:    1. Coronary artery disease of native artery of native heart with stable angina pectoris ([place])   2. Coronary artery vasospasm (HCC)   3. Primary hypertension   4. Dyslipidemia    PLAN:    In order of problems listed above:  Aggressive secondary prevention.  Has suspicion that there is microvascular dysfunction.  Long-acting nitrates do not seem to help.  Will wean and discontinue.  Continue ranolazine.  Consider switching amlodipine to a negative inotropic calcium channel blocker at next visit, potentially diltiazem 120 or 180 mg/day.  Continue [name], continue ranolazine and consider increasing the dose to 100 mg/day. Use sublingual nitroglycerin as needed with acute episodes of chest pain Blood pressure control is excellent on current regimen.  Will need follow-up after isosorbide has been weaned. Continue Crestor.   Overall education and awareness concerning secondary risk prevention was discussed in detail: LDL less than 70, hemoglobin A1c less than 7, blood pressure target less than 130/80  mmHg, >150 minutes of moderate aerobic activity per week, avoidance of smoking, weight control (via diet and exercise), and continued surveillance/management of/for obstructive sleep apnea.  Medication Adjustments/Labs and Tests Ordered: Current medicines are reviewed at length with the patient today.  Concerns regarding medicines are outlined above.  No orders of the defined types were placed in this encounter.  No orders of the defined types were placed in this encounter.   Patient Instructions  Medication Instructions:  1) DECREASE Imdur to 30mg  once daily for 1-2 weeks, then discontinue.  Call us if you start to notice chest discomfort after stopping the medication.  *If you need a refill on your cardiac medications before your next appointment, please call your pharmacy*   Lab Work: None If you have labs (blood work) drawn today and your tests are completely normal, you will receive your results only by: [place] (if you have MyChart) OR A paper copy in the mail If you have any lab test that is abnormal or we need to change your treatment, we will call you to review the results.   Testing/Procedures: None   Follow-Up: At [facility], you and your health needs are our priority.  As part of our continuing mission to provide you with exceptional heart care, we have created designated Provider Care Teams.  These Care Teams include your primary Cardiologist (physician) and Advanced Practice Providers (APPs -  Physician Assistants and Nurse Practitioners) who all work together to provide you with the care you need, when you need it.  We recommend signing up for the patient portal called "MyChart".  Sign up information is provided on this After Visit Summary.  MyChart is used to connect with patients for Virtual Visits (Telemedicine).  Patients are able to view lab/test results, encounter notes, upcoming appointments, etc.  Non-urgent messages can be sent to your provider  as well.   To learn more about what you can do with MyChart, go to [email or web address].    Your next appointment:   4-6 month(s)  The format for your next appointment:   In Person  Provider:   [name], MD    Other Instructions     Signed, [name], MD  09/25/2021 12:14 PM    [place]

## 2021-09-23 ENCOUNTER — Other Ambulatory Visit: Payer: Self-pay

## 2021-09-23 ENCOUNTER — Ambulatory Visit (INDEPENDENT_AMBULATORY_CARE_PROVIDER_SITE_OTHER): Payer: Medicare Other | Admitting: Orthopedic Surgery

## 2021-09-23 ENCOUNTER — Encounter: Payer: Self-pay | Admitting: Orthopedic Surgery

## 2021-09-23 DIAGNOSIS — M79601 Pain in right arm: Secondary | ICD-10-CM | POA: Diagnosis not present

## 2021-09-23 DIAGNOSIS — M5010 Cervical disc disorder with radiculopathy, unspecified cervical region: Secondary | ICD-10-CM | POA: Diagnosis not present

## 2021-09-23 DIAGNOSIS — M79602 Pain in left arm: Secondary | ICD-10-CM

## 2021-09-23 MED ORDER — OXYCODONE-ACETAMINOPHEN 5-325 MG PO TABS: 1.0000 | ORAL_TABLET | Freq: Three times a day (TID) | ORAL | 0 refills | Status: DC | PRN

## 2021-09-23 NOTE — Progress Notes (Signed)
Office Visit Note   Patient: [name]           Date of Birth: 10/09/1945           MRN: [id number] Visit Date: 09/23/2021              Requested by: [name], MD [facility] [employer] [address],  [place] [address] PCP: [name], MD  Chief Complaint  Patient presents with   Neck - Follow-up    MRI review      HPI: Patient is a 76 year old woman who presents in follow-up for cervical radiculopathy.  She is status post an MRI scan.  Patient is status post previous anterior cervical discectomy and fusion with Dr. Sherwood Gambler.  Assessment & Plan: Visit Diagnoses:  1. Bilateral arm pain   2. Cervical disc disorder with radiculopathy, unspecified cervical region     Plan: A referral was made to Dr. Ernestina Patches to evaluate for epidural steroid injections a prescription for Percocet.  Follow-Up Instructions: Return if symptoms worsen or fail to improve.   Ortho Exam  Patient is alert, oriented, no adenopathy, well-dressed, normal affect, normal respiratory effort. Examination patient has no focal motor weakness in either upper extremity.  The right upper extremity has most radicular pain in the triceps regions radiating to the ulnar border of the right hand.  She states she now is having left upper extremity pain worse in the biceps region.  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  Imaging: No results found. No images are attached to the encounter.  Labs: Lab Results  Component Value Date   HGBA1C 6.6 (H) 06/21/2021   HGBA1C 6.4 (H) 10/01/2017   HGBA1C 6.7 (H) 07/29/2017   ESRSEDRATE 9 08/29/2007   REPTSTATUS 10/31/2020 FINAL 10/29/2020   CULT MULTIPLE SPECIES PRESENT, SUGGEST RECOLLECTION (A) 10/29/2020     Lab Results  Component Value Date   ALBUMIN 3.1 (L) 06/21/2021   ALBUMIN 3.8 10/29/2020   ALBUMIN 3.2  (L) 02/04/2016    Lab Results  Component Value Date   MG 1.6 (L) 06/21/2021   MG 2.0 01/24/2017   MG 2.1 09/21/2013   No results found for: VD25OH  No results found for: PREALBUMIN CBC EXTENDED Latest Ref Rng & Units 06/22/2021 06/21/2021 06/21/2021  WBC 4.0 - 10.5 K/uL 5.7 6.1 5.8  RBC 3.87 - 5.11 MIL/uL 4.27 4.37 4.57  HGB 12.0 - 15.0 g/dL 13.2 13.6 14.4  HCT 36.0 - 46.0 % 40.2 41.2 42.9  PLT 150 - 400 K/uL 241 252 271  NEUTROABS 1.7 - 7.7 K/uL - - -  LYMPHSABS 0.7 - 4.0 K/uL - - -     There is no height or weight on file to calculate BMI.  Orders:  No orders of the defined types were placed in this encounter.  No orders of the defined types were placed in this encounter.    Procedures: No procedures performed  Clinical Data: No additional findings.  ROS:  All other systems negative, except as noted in the HPI. Review of Systems  Objective: Vital Signs: There were no vitals taken for this visit.  Specialty Comments:  No specialty comments available.  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[place]) 01/24/2017   Precordial chest pain 01/23/2017   Headache 01/23/2017   S/P  angioplasty with stent 01/23/2017   Coronary artery disease 11/24/2014   Syncope 10/22/2014   Hyperglycemia 04/20/2014   H/O hiatal hernia 04/19/2014   [place] NSTEMI    Coronary artery vasospasm ([place]) 09/26/2013   HTN (hypertension) 12/08/2011   GERD (gastroesophageal reflux disease) 12/08/2011   Depression 12/08/2011   Dyslipidemia 12/08/2011   Past Medical History:  Diagnosis Date   Anemia    Anginal pain ([place])    last cp was last year   Anxiety    Arthritis    Blood transfusion    Chest pain 02/04/2016   Coronary artery disease    Coronary artery spasm, wth continued episodes of chest pain.  09/26/2013   Coronary vasospasm (HCC)    Diabetes mellitus without complication (HCC)    type II   GERD  (gastroesophageal reflux disease)    Headache    Hiatal hernia    Hypercholesteremia    Hypertension    NSTEMI (non-ST elevated myocardial infarction) ([place]) not sure   NSVT (nonsustained ventricular tachycardia) 09/26/2013   Panic attack     Family History  Problem Relation Age of Onset   Heart failure Mother    Stroke Sister    Heart attack Other     Past Surgical History:  Procedure Laterality Date   ABDOMINAL HYSTERECTOMY     PARTIAL HYSTERECTOMY   breast     right, tumor benign   BREAST EXCISIONAL BIOPSY Right    26 (age 1) benign   CARDIAC CATHETERIZATION  2014   CARDIAC CATHETERIZATION  2012   CARDIAC CATHETERIZATION N/A 07/22/2016   Procedure: Left Heart Cath and Coronary Angiography;  Surgeon: Belva Crome, MD;  Location: [place] [facility];  Service: Cardiovascular;  Laterality: N/A;   cardiac stent  2012   to RCA   CHOLECYSTECTOMY N/A 10/02/2017   Procedure: LAPAROSCOPIC CHOLECYSTECTOMY;  Surgeon: Ralene Ok, MD;  Location: [facility];  Service: General;  Laterality: N/A;   CORONARY ANGIOPLASTY     ESOPHAGEAL MANOMETRY N/A 10/29/2016   Procedure: ESOPHAGEAL MANOMETRY (EM);  Surgeon: Garlan Fair, MD;  [facility];  Service: Endoscopy;  Laterality: N/A;   ESOPHAGOGASTRODUODENOSCOPY (EGD) WITH PROPOFOL N/A 10/28/2016   Procedure: ESOPHAGOGASTRODUODENOSCOPY (EGD) WITH PROPOFOL;  Surgeon: Garlan Fair, MD;  [facility];  Service: Endoscopy;  Laterality: N/A;   EXCISION OF SKIN TAG N/A 07/29/2017   Procedure: EXCISION OF PERIANAL  SKIN TAG;  Surgeon: Ralene Ok, MD;  Location: [place];  Service: General;  Laterality: N/A;   KNEE SURGERY  left   left ear surgery     for bad cut   LEFT HEART CATHETERIZATION WITH CORONARY ANGIOGRAM N/A 02/22/2013   Procedure: LEFT HEART CATHETERIZATION WITH CORONARY ANGIOGRAM;  Surgeon: [name], MD;  Location: [facility];  Service: Cardiovascular;  Laterality: N/A;   SHOULDER SURGERY  Bilateral    rotator cuff   Social History   Occupational History   Not on file  Tobacco Use   Smoking status: Some Days    Packs/day: 0.50    Years: 40.00    Pack years: 20.00    Types: Cigarettes    Last attempt to quit: 01/10/2016    Years since quitting: 5.7   Smokeless tobacco: Never   Tobacco comments:    occasional   Vaping Use   Vaping Use: Never used  Substance and Sexual Activity   Alcohol use: Yes    Alcohol/week: 1.0 standard drink    Types: 1  Cans of beer per week    Comment: occasional beer   Drug use: No   Sexual activity: Never

## 2021-09-25 ENCOUNTER — Telehealth: Payer: Self-pay | Admitting: Physical Medicine and Rehabilitation

## 2021-09-25 ENCOUNTER — Other Ambulatory Visit: Payer: Self-pay

## 2021-09-25 ENCOUNTER — Encounter: Payer: Self-pay | Admitting: Interventional Cardiology

## 2021-09-25 ENCOUNTER — Ambulatory Visit: Payer: Medicare Other | Admitting: Interventional Cardiology

## 2021-09-25 VITALS — BP 112/62 | HR 69 | Ht 59.0 in | Wt 113.0 lb

## 2021-09-25 DIAGNOSIS — E785 Hyperlipidemia, unspecified: Secondary | ICD-10-CM

## 2021-09-25 DIAGNOSIS — I201 Angina pectoris with documented spasm: Secondary | ICD-10-CM

## 2021-09-25 DIAGNOSIS — I10 Essential (primary) hypertension: Secondary | ICD-10-CM | POA: Diagnosis not present

## 2021-09-25 DIAGNOSIS — I25118 Atherosclerotic heart disease of native coronary artery with other forms of angina pectoris: Secondary | ICD-10-CM | POA: Diagnosis not present

## 2021-09-25 NOTE — Telephone Encounter (Signed)
Patient called. She would like to know if she can get the injection Monday? Would like a call back. [phone]

## 2021-09-25 NOTE — Patient Instructions (Signed)
Medication Instructions:  1) DECREASE Imdur to 30mg  once daily for 1-2 weeks, then discontinue.  Call us if you start to notice chest discomfort after stopping the medication.  *If you need a refill on your cardiac medications before your next appointment, please call your pharmacy*   Lab Work: None If you have labs (blood work) drawn today and your tests are completely normal, you will receive your results only by: [place] (if you have MyChart) OR A paper copy in the mail If you have any lab test that is abnormal or we need to change your treatment, we will call you to review the results.   Testing/Procedures: None   Follow-Up: At [facility], you and your health needs are our priority.  As part of our continuing mission to provide you with exceptional heart care, we have created designated Provider Care Teams.  These Care Teams include your primary Cardiologist (physician) and Advanced Practice Providers (APPs -  Physician Assistants and Nurse Practitioners) who all work together to provide you with the care you need, when you need it.  We recommend signing up for the patient portal called "MyChart".  Sign up information is provided on this After Visit Summary.  MyChart is used to connect with patients for Virtual Visits (Telemedicine).  Patients are able to view lab/test results, encounter notes, upcoming appointments, etc.  Non-urgent messages can be sent to your provider as well.   To learn more about what you can do with MyChart, go to [email or web address].    Your next appointment:   4-6 month(s)  The format for your next appointment:   In Person  Provider:   [name], MD    Other Instructions

## 2021-09-30 ENCOUNTER — Ambulatory Visit: Payer: Medicare Other | Admitting: Physical Medicine and Rehabilitation

## 2021-10-01 ENCOUNTER — Other Ambulatory Visit: Payer: Self-pay

## 2021-10-01 ENCOUNTER — Ambulatory Visit: Payer: Self-pay

## 2021-10-01 ENCOUNTER — Ambulatory Visit (INDEPENDENT_AMBULATORY_CARE_PROVIDER_SITE_OTHER): Payer: Medicare Other | Admitting: Physical Medicine and Rehabilitation

## 2021-10-01 ENCOUNTER — Encounter: Payer: Self-pay | Admitting: Physical Medicine and Rehabilitation

## 2021-10-01 VITALS — BP 126/84 | HR 85

## 2021-10-01 DIAGNOSIS — M5412 Radiculopathy, cervical region: Secondary | ICD-10-CM

## 2021-10-01 MED ORDER — METHYLPREDNISOLONE ACETATE 80 MG/ML IJ SUSP
80.0000 mg | Freq: Once | INTRAMUSCULAR | Status: AC
  Administered 2021-10-01: 80 mg

## 2021-10-01 NOTE — Progress Notes (Signed)
Pt state neck pain that travels to both shoulders and arms. Pt state any movement makes the pain worse. Pt state she take [place] meds and uses heat to help ease her pain.  Numeric Pain Rating Scale and Functional Assessment Average Pain 8   In the last MONTH (on 0-10 scale) has pain interfered with the following?  1. General activity like being  able to carry out your everyday physical activities such as walking, climbing stairs, carrying groceries, or moving a chair?  Rating(10)   +Driver, -BT, -Dye Allergies.

## 2021-10-01 NOTE — Patient Instructions (Signed)

## 2021-10-10 NOTE — Procedures (Signed)
Cervical Epidural Steroid Injection - Interlaminar Approach with Fluoroscopic Guidance  Patient: [name]      Date of Birth: 20-May-1946 MRN: [id number] PCP: [name], MD      Visit Date: 10/01/2021   Universal Protocol:    Date/Time: 03/02/236:11 AM  Consent Given By: the patient  Position: PRONE  Additional Comments: Vital signs were monitored before and after the procedure. Patient was prepped and draped in the usual sterile fashion. The correct patient, procedure, and site was verified.   Injection Procedure Details:   Procedure diagnoses: Cervical radiculopathy [M54.12]    Meds Administered:  Meds ordered this encounter  Medications   methylPREDNISolone acetate (DEPO-MEDROL) injection 80 mg     Laterality: Right  Location/Site: C7-T1  Needle: 3.5 in., 20 ga. Tuohy  Needle Placement: Paramedian epidural space  Findings:  -Comments: Excellent flow of contrast into the epidural space.  Procedure Details: Using a paramedian approach from the side mentioned above, the region overlying the inferior lamina was localized under fluoroscopic visualization and the soft tissues overlying this structure were infiltrated with 4 ml. of 1% Lidocaine without Epinephrine. A # 20 gauge, Tuohy needle was inserted into the epidural space using a paramedian approach.  The epidural space was localized using loss of resistance along with contralateral oblique bi-planar fluoroscopic views.  After negative aspirate for air, blood, and CSF, a 2 ml. volume of Isovue-250 was injected into the epidural space and the flow of contrast was observed. Radiographs were obtained for documentation purposes.   The injectate was administered into the level noted above.  Additional Comments:  The patient tolerated the procedure well Dressing: 2 x 2 sterile gauze and Band-Aid    Post-procedure details: Patient was observed during the procedure. Post-procedure instructions were  reviewed.  Patient left the clinic in stable condition.

## 2021-10-10 NOTE — Progress Notes (Signed)
[name] - 76 y.o. female MRN [id number]  Date of birth: [birth year]  Office Visit Note: Visit Date: 10/01/2021 PCP: [name], MD Referred by: [name], MD  Subjective: Chief Complaint  Patient presents with   Neck - Pain   Right Shoulder - Pain   Left Shoulder - Pain   Right Arm - Pain   Left Arm - Pain   HPI:  [name] is a 76 y.o. female who comes in today at the request of Dr. Meridee Score for planned Right C7-T1 Cervical Interlaminar epidural steroid injection with fluoroscopic guidance.  The patient has failed conservative care including home exercise, medications, time and activity modification.  This injection will be diagnostic and hopefully therapeutic.  Please see requesting physician notes for further details and justification.  ROS Otherwise per HPI.  Assessment & Plan: Visit Diagnoses:    ICD-10-CM   1. Cervical radiculopathy  M54.12 XR C-ARM NO REPORT    Epidural Steroid injection    methylPREDNISolone acetate (DEPO-MEDROL) injection 80 mg      Plan: No additional findings.   Meds & Orders:  Meds ordered this encounter  Medications   methylPREDNISolone acetate (DEPO-MEDROL) injection 80 mg    Orders Placed This Encounter  Procedures   XR C-ARM NO REPORT   Epidural Steroid injection    Follow-up: Return if symptoms worsen or fail to improve.   Procedures: No procedures performed  Cervical Epidural Steroid Injection - Interlaminar Approach with Fluoroscopic Guidance  Patient: [name]      Date of Birth: 04-28-1946 MRN: [id number] PCP: [name], MD      Visit Date: 10/01/2021   Universal Protocol:    Date/Time: 03/02/236:11 AM  Consent Given By: the patient  Position: PRONE  Additional Comments: Vital signs were monitored before and after the procedure. Patient was prepped and draped in the usual sterile fashion. The correct patient, procedure, and site was verified.   Injection Procedure Details:    Procedure diagnoses: Cervical radiculopathy [M54.12]    Meds Administered:  Meds ordered this encounter  Medications   methylPREDNISolone acetate (DEPO-MEDROL) injection 80 mg     Laterality: Right  Location/Site: C7-T1  Needle: 3.5 in., 20 ga. Tuohy  Needle Placement: Paramedian epidural space  Findings:  -Comments: Excellent flow of contrast into the epidural space.  Procedure Details: Using a paramedian approach from the side mentioned above, the region overlying the inferior lamina was localized under fluoroscopic visualization and the soft tissues overlying this structure were infiltrated with 4 ml. of 1% Lidocaine without Epinephrine. A # 20 gauge, Tuohy needle was inserted into the epidural space using a paramedian approach.  The epidural space was localized using loss of resistance along with contralateral oblique bi-planar fluoroscopic views.  After negative aspirate for air, blood, and CSF, a 2 ml. volume of Isovue-250 was injected into the epidural space and the flow of contrast was observed. Radiographs were obtained for documentation purposes.   The injectate was administered into the level noted above.  Additional Comments:  The patient tolerated the procedure well Dressing: 2 x 2 sterile gauze and Band-Aid    Post-procedure details: Patient was observed during the procedure. Post-procedure instructions were reviewed.  Patient left the clinic in stable condition.    Clinical History: No specialty comments available.     Objective:  VS:  HT:     WT:    BMI:      BP:126/84   HR:85bpm   TEMP: ( )  RESP:  Physical Exam Vitals and nursing note reviewed.  Constitutional:      General: She is not in acute distress.    Appearance: Normal appearance. She is not ill-appearing.  HENT:     Head: Normocephalic and atraumatic.     Right Ear: External ear normal.     Left Ear: External ear normal.  Eyes:     Extraocular Movements: Extraocular movements intact.   Cardiovascular:     Rate and Rhythm: Normal rate.     Pulses: Normal pulses.  Musculoskeletal:     Cervical back: Tenderness present. No rigidity.     Right lower leg: No edema.     Left lower leg: No edema.     Comments: Patient has good strength in the upper extremities including 5 out of 5 strength in wrist extension long finger flexion and APB.  There is no atrophy of the hands intrinsically.  There is a negative Hoffmann's test.   Lymphadenopathy:     Cervical: No cervical adenopathy.  Skin:    Findings: No erythema, lesion or rash.  Neurological:     General: No focal deficit present.     Mental Status: She is alert and oriented to person, place, and time.     Sensory: No sensory deficit.     Motor: No weakness or abnormal muscle tone.     Coordination: Coordination normal.  Psychiatric:        Mood and Affect: Mood normal.        Behavior: Behavior normal.     Imaging: No results found.

## 2021-10-14 ENCOUNTER — Telehealth: Payer: Self-pay | Admitting: Physical Medicine and Rehabilitation

## 2021-10-14 NOTE — Telephone Encounter (Signed)
Patient called advised the injection helped some but, she is still feeling pain in her right shoulder and down her arm. Patient asked what id her next plan of care? The number to contact patient is [phone]    ?

## 2021-11-28 ENCOUNTER — Encounter: Payer: Self-pay | Admitting: Physical Medicine and Rehabilitation

## 2021-11-28 ENCOUNTER — Ambulatory Visit (INDEPENDENT_AMBULATORY_CARE_PROVIDER_SITE_OTHER): Payer: Medicare Other | Admitting: Physical Medicine and Rehabilitation

## 2021-11-28 ENCOUNTER — Ambulatory Visit: Payer: Self-pay

## 2021-11-28 VITALS — BP 130/81 | HR 68

## 2021-11-28 DIAGNOSIS — R202 Paresthesia of skin: Secondary | ICD-10-CM

## 2021-11-28 DIAGNOSIS — G8929 Other chronic pain: Secondary | ICD-10-CM | POA: Diagnosis not present

## 2021-11-28 DIAGNOSIS — M25511 Pain in right shoulder: Secondary | ICD-10-CM

## 2021-11-28 DIAGNOSIS — M5412 Radiculopathy, cervical region: Secondary | ICD-10-CM | POA: Diagnosis not present

## 2021-11-28 MED ORDER — PREGABALIN 50 MG PO CAPS
ORAL_CAPSULE | ORAL | 1 refills | Status: DC
Start: 2021-11-28 — End: 2022-02-12

## 2021-11-28 MED ORDER — METHYLPREDNISOLONE ACETATE 80 MG/ML IJ SUSP
80.0000 mg | Freq: Once | INTRAMUSCULAR | Status: AC
  Administered 2021-11-28: 80 mg

## 2021-11-28 NOTE — Patient Instructions (Signed)

## 2021-11-28 NOTE — Progress Notes (Signed)
? ?[name] - 76 y.o. female MRN [id number]  Date of birth: [birth year] ? ?Office Visit Note: ?Visit Date: 11/28/2021 ?PCP: [name], MD ?Referred by: [name], MD ? ?Subjective: ?Chief Complaint  ?Patient presents with  ? Neck - Pain  ? Right Shoulder - Pain  ? Left Shoulder - Pain  ? ?HPI: [name] is a 76 y.o. female who comes in today for planned repeat cervical epidural injection.  Those notes can be reviewed when we did the injection prior to this.  She tells me today however she has had different symptoms where she does get numbness tingling paresthesia in the right hand and somewhat of a more radial side of the hand distribution but could be her whole arm.  She gets pain into both shoulders and shoulder blades.  Right shoulder hurts with movement and that has been the initial complaint with her for some time.  She has been followed by Dr. Meridee Score and Dondra Prader, FN-P.  She reports cervical injection really did not help her shoulder as much as it at her neck pain and shoulder blade pain.  MRI of the cervical spine reviewed once again today and reviewed below in the note.  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  No high-grade central stenosis.  Exam does show impingement of the right shoulder with external rotation pain.  She has not had prior electrodiagnostic study or prior carpal tunnel issues that are looked at.  She denies any left-sided hand paresthesias.  Her pain is 8 out of 10 and debilitating.  She takes Tylenol and small amount of Percocet through Dr. Sharol Given.  She also takes some medication for sleep and anxiety.  She has not tried any medication such as Lyrica.  She has had a chronic pain syndrome history.  She has a remote history of C5-6 ACDF by Dr. Sherwood Gambler. ? ? ? ?Review of Systems  ?Musculoskeletal:  Positive for joint pain and neck pain.  ?Neurological:  Positive for tingling.  ?All other systems reviewed and are negative. Otherwise per  HPI. ? ?Assessment & Plan: ?Visit Diagnoses:  ?  ICD-10-CM   ?1. Cervical radiculopathy  M54.12 XR C-ARM NO REPORT  ?  Epidural Steroid injection  ?  methylPREDNISolone acetate (DEPO-MEDROL) injection 80 mg  ?  ?2. Chronic right shoulder pain  M25.511   ? G89.29   ?  ?3. Paresthesia of skin  R20.2   ?  ?   ?Plan: Findings:  ?1.  Neck pain and bilateral shoulder blade pain and arm pain.  Pain is multifactorial but she is getting some pain conceivably from her cervical spine.  History of prior ACDF with adjacent level disease.  Epidural injection did seem to help temporarily with some of the symptoms so we are going to repeat this.  She did get more than 50% relief diagnostically of the neck and shoulder blade pain.  Depending on relief she can follow-up with her neurosurgeon at [facility]. ? ?2.  Right shoulder pain chronic.  She should follow-up with Dr. Meridee Score for continued management of her shoulder pain.  This seems to worsen with movement and does not seem to be radicular per se on the right.  Epidural injection did not seem to help this part.  She does have some pain on movement on exam. ? ?3.  Right hand paresthesia.  Could be a combination of radicular symptoms or carpal tunnel syndrome.  She has signs on  exam consistent with carpal tunnel syndrome.  We will schedule a nerve study for the right upper arm.  We will start a tapering dose of Lyrica today.  ? ?Meds & Orders:  ?Meds ordered this encounter  ?Medications  ? methylPREDNISolone acetate (DEPO-MEDROL) injection 80 mg  ? pregabalin (LYRICA) 50 MG capsule  ?  Sig: Take 1 cap by mouth at night for 7 nights then take 1 cap by mouth in the morning and 1 at night.  ?  Dispense:  60 capsule  ?  Refill:  1  ?  ?Orders Placed This Encounter  ?Procedures  ? XR C-ARM NO REPORT  ? Epidural Steroid injection  ?  ?Follow-up: Return if symptoms worsen or fail to improve.  ? ?Procedures: ?No procedures performed  ?Cervical Epidural  Steroid Injection - Interlaminar Approach with Fluoroscopic Guidance ? ?Patient: [name]      ?Date of Birth: April 29, 1946 ?MRN: [id number] ?PCP: [name], MD      ?Visit Date: 11/28/2021 ?  ?Universal Protocol:    ?Date/Time: 05/01/235:45 AM ? ?Consent Given By: the patient ? ?Position: PRONE ? ?Additional Comments: ?Vital signs were monitored before and after the procedure. ?Patient was prepped and draped in the usual sterile fashion. ?The correct patient, procedure, and site was verified. ? ? ?Injection Procedure Details:  ? ?Procedure diagnoses: Cervical radiculopathy [M54.12]   ? ?Meds Administered:  ?Meds ordered this encounter  ?Medications  ? methylPREDNISolone acetate (DEPO-MEDROL) injection 80 mg  ? pregabalin (LYRICA) 50 MG capsule  ?  Sig: Take 1 cap by mouth at night for 7 nights then take 1 cap by mouth in the morning and 1 at night.  ?  Dispense:  60 capsule  ?  Refill:  1  ?  ? ?Laterality: Right ? ?Location/Site: C7-T1 ? ?Needle: 3.5 in., 20 ga. Tuohy ? ?Needle Placement: Paramedian epidural space ? ?Findings: ? -Comments: Excellent flow of contrast into the epidural space. ? ?Procedure Details: ?Using a paramedian approach from the side mentioned above, the region overlying the inferior lamina was localized under fluoroscopic visualization and the soft tissues overlying this structure were infiltrated with 4 ml. of 1% Lidocaine without Epinephrine. A # 20 gauge, Tuohy needle was inserted into the epidural space using a paramedian approach. ? ?The epidural space was localized using loss of resistance along with contralateral oblique bi-planar fluoroscopic views.  After negative aspirate for air, blood, and CSF, a 2 ml. volume of Isovue-250 was injected into the epidural space and the flow of contrast was observed. Radiographs were obtained for documentation purposes.  ? ?The injectate was administered into the level noted above. ? ?Additional Comments:  ?The patient tolerated the procedure  well ?Dressing: 2 x 2 sterile gauze and Band-Aid ?  ? ?Post-procedure details: ?Patient was observed during the procedure. ?Post-procedure instructions were reviewed. ? ?Patient left the clinic in stable condition.   ? ?Clinical History: ?MRI CERVICAL SPINE WITHOUT CONTRAST ?  ?TECHNIQUE: ?Multiplanar, multisequence MR imaging of the cervical spine was ?performed. No intravenous contrast was administered. ?  ?COMPARISON:  Cervical spine MRI 05/06/2014 report, images not ?retrievable at the time of this dictation. ?  ?FINDINGS: ?Alignment: Trace anterolisthesis at C4-C5. ?  ?Vertebrae: No fracture, evidence of discitis, or aggressive osseous ?lesion. ?  ?Cord: Normal signal and morphology. ?  ?Posterior Fossa, vertebral arteries, paraspinal tissues: Negative. ?  ?Disc levels: C1-C2 degenerative changes. Patent craniocervical ?junction. ?  ?C2-C3: No significant spinal canal or neural foraminal stenosis. ?  ?C3-C4:  Mild disc bulging with mild facet arthropathy right worse ?than left. Mild spinal canal stenosis. Mild bilateral neural ?foraminal stenosis. ?  ?C4-C5: Trace anterolisthesis with minimal disc bulging, uncinate ?spurring and bilateral facet arthropathy, right worse than left. ?Mild spinal canal stenosis. Moderate right and mild left neural ?foraminal stenosis. ?  ?C5-C6: Prior ACDF with bony fusion. Patent spinal canal and neural ?foramina. ?  ?C6-C7: Asymmetric right disc bulging with left greater than right ?facet arthropathy. No significant spinal canal stenosis. Mild right ?neural foraminal narrowing. ?  ?C7-T1: No significant spinal canal or neural foraminal narrowing. ?  ?IMPRESSION: ?Multilevel degenerative changes of the cervical spine, summarized ?below: ?  ?C3-C4: Mild spinal canal stenosis. Mild bilateral neural foraminal ?stenosis. ?  ?C4-C5: Mild spinal canal stenosis. Moderate right and mild left ?neural foraminal stenosis. ?  ?C5-C6: Prior ACDF with solid bony fusion. Patent spinal canal  and ?neural foramina. ?  ?C6-C7: Mild right neural foraminal stenosis. ?  ?  ?Electronically Signed ?  By: Maurine Simmering M.D. ?  On: 09/16/2021 15:57  ? ?She reports that she has been smoking cigarettes. She has a

## 2021-11-28 NOTE — Progress Notes (Signed)
Pt state neck pain that travels to both shoulders and arms. Pt state any movement makes the pain worse. Pt state she take [place] meds and uses heat to help ease her pain. ? ?Numeric Pain Rating Scale and Functional Assessment ?Average Pain 8 ? ? ?In the last MONTH (on 0-10 scale) has pain interfered with the following? ? ?1. General activity like being  able to carry out your everyday physical activities such as walking, climbing stairs, carrying groceries, or moving a chair?  ?Rating(10) ? ? ?+Driver, -BT, -Dye Allergies. ? ?

## 2021-12-09 NOTE — Procedures (Signed)
Cervical Epidural Steroid Injection - Interlaminar Approach with Fluoroscopic Guidance ? ?Patient: [name]      ?Date of Birth: 1946-02-03 ?MRN: [id number] ?PCP: [name], MD      ?Visit Date: 11/28/2021 ?  ?Universal Protocol:    ?Date/Time: 05/01/235:45 AM ? ?Consent Given By: the patient ? ?Position: PRONE ? ?Additional Comments: ?Vital signs were monitored before and after the procedure. ?Patient was prepped and draped in the usual sterile fashion. ?The correct patient, procedure, and site was verified. ? ? ?Injection Procedure Details:  ? ?Procedure diagnoses: Cervical radiculopathy [M54.12]   ? ?Meds Administered:  ?Meds ordered this encounter  ?Medications  ? methylPREDNISolone acetate (DEPO-MEDROL) injection 80 mg  ? pregabalin (LYRICA) 50 MG capsule  ?  Sig: Take 1 cap by mouth at night for 7 nights then take 1 cap by mouth in the morning and 1 at night.  ?  Dispense:  60 capsule  ?  Refill:  1  ?  ? ?Laterality: Right ? ?Location/Site: C7-T1 ? ?Needle: 3.5 in., 20 ga. Tuohy ? ?Needle Placement: Paramedian epidural space ? ?Findings: ? -Comments: Excellent flow of contrast into the epidural space. ? ?Procedure Details: ?Using a paramedian approach from the side mentioned above, the region overlying the inferior lamina was localized under fluoroscopic visualization and the soft tissues overlying this structure were infiltrated with 4 ml. of 1% Lidocaine without Epinephrine. A # 20 gauge, Tuohy needle was inserted into the epidural space using a paramedian approach. ? ?The epidural space was localized using loss of resistance along with contralateral oblique bi-planar fluoroscopic views.  After negative aspirate for air, blood, and CSF, a 2 ml. volume of Isovue-250 was injected into the epidural space and the flow of contrast was observed. Radiographs were obtained for documentation purposes.  ? ?The injectate was administered into the level noted above. ? ?Additional Comments:  ?The patient  tolerated the procedure well ?Dressing: 2 x 2 sterile gauze and Band-Aid ?  ? ?Post-procedure details: ?Patient was observed during the procedure. ?Post-procedure instructions were reviewed. ? ?Patient left the clinic in stable condition.  ?

## 2021-12-27 ENCOUNTER — Ambulatory Visit (INDEPENDENT_AMBULATORY_CARE_PROVIDER_SITE_OTHER): Payer: Medicare Other | Admitting: Physical Medicine and Rehabilitation

## 2021-12-27 ENCOUNTER — Encounter: Payer: Self-pay | Admitting: Physical Medicine and Rehabilitation

## 2021-12-27 DIAGNOSIS — R202 Paresthesia of skin: Secondary | ICD-10-CM | POA: Diagnosis not present

## 2021-12-27 NOTE — Progress Notes (Signed)
Pt state right shoulder pain that travels down to her hand and fingers. Pt state when she wakes up and during the day she has numbness in her hand and fingers. Pt state she takes over the counter pain meds to help ease her pain. Pt state she right handed.  Numeric Pain Rating Scale and Functional Assessment Average Pain 7   In the last MONTH (on 0-10 scale) has pain interfered with the following?  1. General activity like being  able to carry out your everyday physical activities such as walking, climbing stairs, carrying groceries, or moving a chair?  Rating(10)    -BT, -Dye Allergies.

## 2021-12-30 NOTE — Progress Notes (Signed)
[name] - 76 y.o. female MRN [id number]  Date of birth: [birth year]  Office Visit Note: Visit Date: 12/27/2021 PCP: [name], MD Referred by: Magnus Sinning, MD  Subjective: Chief Complaint  Patient presents with   Right Shoulder - Numbness   Right Arm - Numbness   Right Hand - Numbness   HPI:  [name] is a 76 y.o. female who comes in today at the request of Barnet Pall, FNP for electrodiagnostic study of the Right upper extremities.  Patient is Right hand dominant.  Please see the prior notes from Dr. Meridee Score and Dondra Prader, FN-P as well as myself.  She has had ongoing neck and arm pain with paresthesia with prior cervical fusion and updated cervical MRI.  Symptoms in the hands seem to be more related to possibly a carpal tunnel syndrome versus a double crush phenomenon.  She is status post 2 cervical epidural injections with some help with the neck pain but not the hand paresthesia.  ROS Otherwise per HPI.  Assessment & Plan: Visit Diagnoses:    ICD-10-CM   1. 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   There is no significant electrodiagnostic evidence of any other focal nerve entrapment, brachial plexopathy or cervical radiculopathy.   Recommendations: 1.  Follow-up with referring physician. 2.  Continue current management of symptoms. 3.  Suggest surgical evaluation.  Meds & Orders: No orders of the defined types were placed in this encounter.   Orders Placed This Encounter  Procedures   NCV with EMG (electromyography)    Follow-up: Return in about 2 weeks (around 01/10/2022) for  Meridee Score, MD or Dondra Prader, FN-P.   Procedures: No procedures performed  Impression: The above electrodiagnostic study is  ABNORMAL and reveals evidence of a moderate right median nerve entrapment at the wrist (carpal tunnel syndrome) affecting sensory and motor components.   There is no significant electrodiagnostic evidence of any other focal nerve entrapment, brachial plexopathy or cervical radiculopathy.   Recommendations: 1.  Follow-up with referring physician. 2.  Continue current management of symptoms. 3.  Suggest surgical evaluation.  ___________________________ Laurence Spates FAAPMR Board Certified, American Board of Physical Medicine and Rehabilitation    Nerve Conduction Studies Anti Sensory Summary Table   Stim Site NR Peak (ms) Norm Peak (ms) P-T Amp (V) Norm P-T Amp Site1 Site2 Delta-P (ms) Dist (cm) Vel (m/s) Norm Vel (m/s)  Right Median Acr Palm Anti Sensory (2nd Digit)  31.2C  Wrist    *5.4 <3.6 11.0 >10 Wrist Palm  0.0    Palm *NR  <2.0          Right Radial Anti Sensory (Base 1st Digit)  31C  Wrist    2.3 <3.1 44.2  Wrist Base 1st Digit 2.3 0.0    Right Ulnar Anti Sensory (5th Digit)  31.4C  Wrist    3.3 <3.7 27.6 >15.0 Wrist 5th Digit 3.3 14.0 42 >38   Motor Summary Table   Stim Site NR Onset (ms) Norm Onset (ms) O-P Amp (mV) Norm O-P Amp Site1 Site2 Delta-0 (ms) Dist (cm) Vel (m/s) Norm Vel (m/s)  Right Median Motor (Abd Poll Brev)  31C  Wrist    *4.6 <4.2 7.2 >5 Elbow Wrist 4.2 19.5 *46 >50  Elbow  8.8  6.6         Right Ulnar Motor (Abd Dig Min)  31.2C  Wrist    2.8 <4.2 11.4 >3 B Elbow Wrist 3.0 18.0 60 >53  B Elbow    5.8  11.2  A Elbow B Elbow 1.5 9.0 60 >53  A Elbow    7.3  10.3          EMG   Side Muscle Nerve Root Ins Act Fibs Psw Amp Dur Poly Recrt Int Fraser Din Comment  Right 1stDorInt Ulnar C8-T1 Nml Nml Nml Nml Nml 0 Nml Nml   Right Abd Poll Brev Median C8-T1 Nml Nml Nml Nml Nml 0 Nml Nml   Right ExtDigCom   Nml Nml Nml Nml Nml 0 Nml Nml   Right Triceps Radial C6-7-8 Nml Nml Nml Nml Nml 0 Nml Nml   Right Deltoid Axillary C5-6 Nml Nml Nml Nml Nml 0 Nml Nml      Nerve Conduction Studies Anti Sensory Left/Right Comparison   Stim Site L Lat (ms) R Lat (ms) L-R Lat (ms) L Amp (V) R Amp (V) L-R Amp (%) Site1 Site2 L Vel (m/s) R Vel (m/s) L-R Vel (m/s)  Median Acr Palm Anti Sensory (2nd Digit)  31.2C  Wrist  *5.4   11.0  Wrist Palm     Palm             Radial Anti Sensory (Base 1st Digit)  31C  Wrist  2.3   44.2  Wrist Base 1st Digit     Ulnar Anti Sensory (5th Digit)  31.4C  Wrist  3.3   27.6  Wrist 5th Digit  42    Motor Left/Right Comparison   Stim Site L Lat (ms) R Lat (ms) L-R Lat (ms) L Amp (mV) R Amp (mV) L-R Amp (%) Site1 Site2 L Vel (m/s) R Vel (m/s) L-R Vel (m/s)  Median Motor (Abd Poll Brev)  31C  Wrist  *4.6   7.2  Elbow Wrist  *46   Elbow  8.8   6.6        Ulnar Motor (Abd Dig Min)  31.2C  Wrist  2.8   11.4  B Elbow Wrist  60   B Elbow  5.8   11.2  A Elbow B Elbow  60   A Elbow  7.3   10.3           Waveforms:             Clinical History: MRI CERVICAL SPINE WITHOUT CONTRAST   TECHNIQUE: Multiplanar, multisequence MR imaging of the cervical spine was performed. No intravenous contrast was administered.   COMPARISON:  Cervical spine MRI 05/06/2014 report, images not retrievable at the time of this dictation.   FINDINGS: Alignment: Trace anterolisthesis at C4-C5.   Vertebrae: No fracture, evidence of discitis, or aggressive osseous lesion.   Cord: Normal signal and morphology.   Posterior Fossa, vertebral arteries, paraspinal tissues: Negative.   Disc levels: C1-C2 degenerative changes. Patent craniocervical junction.   C2-C3: No significant spinal canal or neural foraminal stenosis.   C3-C4: Mild disc bulging with mild facet arthropathy right worse than left. Mild spinal canal stenosis. Mild bilateral neural foraminal stenosis.   C4-C5: Trace anterolisthesis with minimal disc bulging, uncinate spurring and bilateral facet arthropathy, right worse than left. Mild spinal canal stenosis.  Moderate right and mild left neural foraminal stenosis.   C5-C6: Prior ACDF with bony fusion. Patent spinal canal and neural foramina.   C6-C7:  Asymmetric right disc bulging with left greater than right facet arthropathy. No significant spinal canal stenosis. Mild right neural foraminal narrowing.   C7-T1: No significant spinal canal or neural foraminal narrowing.   IMPRESSION: Multilevel degenerative changes of the cervical spine, summarized below:   C3-C4: Mild spinal canal stenosis. Mild bilateral neural foraminal stenosis.   C4-C5: Mild spinal canal stenosis. Moderate right and mild left neural foraminal stenosis.   C5-C6: Prior ACDF with solid bony fusion. Patent spinal canal and neural foramina.   C6-C7: Mild right neural foraminal stenosis.     Electronically Signed   By: Maurine Simmering M.D.   On: 09/16/2021 15:57     Objective:  VS:  HT:    WT:   BMI:     BP:   HR: bpm  TEMP: ( )  RESP:  Physical Exam Musculoskeletal:        General: No swelling, tenderness or deformity.     Comments: Inspection reveals no atrophy of the bilateral APB or FDI or hand intrinsics. There is no swelling, color changes, allodynia or dystrophic changes. There is 5 out of 5 strength in the bilateral wrist extension, finger abduction and long finger flexion. There is intact sensation to light touch in all dermatomal and peripheral nerve distributions.  There is a positive Phalen's test on the right. There is a negative Hoffmann's test bilaterally.  Skin:    General: Skin is warm and dry.     Findings: No erythema or rash.  Neurological:     General: No focal deficit present.     Mental Status: She is alert and oriented to person, place, and time.     Motor: No weakness or abnormal muscle tone.     Coordination: Coordination normal.  Psychiatric:        Mood and Affect: Mood normal.        Behavior: Behavior normal.     Imaging: No results found.

## 2021-12-30 NOTE — Procedures (Signed)
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   There is no significant electrodiagnostic evidence of any other focal nerve entrapment, brachial plexopathy or cervical radiculopathy.   Recommendations: 1.  Follow-up with referring physician. 2.  Continue current management of symptoms. 3.  Suggest surgical evaluation.  ___________________________ Laurence Spates FAAPMR Board Certified, American Board of Physical Medicine and Rehabilitation    Nerve Conduction Studies Anti Sensory Summary Table   Stim Site NR Peak (ms) Norm Peak (ms) P-T Amp (V) Norm P-T Amp Site1 Site2 Delta-P (ms) Dist (cm) Vel (m/s) Norm Vel (m/s)  Right Median Acr Palm Anti Sensory (2nd Digit)  31.2C  Wrist    *5.4 <3.6 11.0 >10 Wrist Palm  0.0    Palm *NR  <2.0          Right Radial Anti Sensory (Base 1st Digit)  31C  Wrist    2.3 <3.1 44.2  Wrist Base 1st Digit 2.3 0.0    Right Ulnar Anti Sensory (5th Digit)  31.4C  Wrist    3.3 <3.7 27.6 >15.0 Wrist 5th Digit 3.3 14.0 42 >38   Motor Summary Table   Stim Site NR Onset (ms) Norm Onset (ms) O-P Amp (mV) Norm O-P Amp Site1 Site2 Delta-0 (ms) Dist (cm) Vel (m/s) Norm Vel (m/s)  Right Median Motor (Abd Poll Brev)  31C  Wrist    *4.6 <4.2 7.2 >5 Elbow Wrist 4.2 19.5 *46 >50  Elbow    8.8  6.6         Right Ulnar Motor (Abd Dig Min)  31.2C  Wrist    2.8 <4.2 11.4 >3 B Elbow Wrist 3.0 18.0 60 >53  B Elbow    5.8  11.2  A Elbow B Elbow 1.5 9.0 60 >53  A Elbow    7.3  10.3          EMG   Side Muscle Nerve Root Ins Act Fibs Psw Amp Dur Poly Recrt Int Fraser Din Comment  Right 1stDorInt Ulnar C8-T1 Nml Nml Nml Nml Nml 0 Nml Nml   Right Abd Poll Brev Median C8-T1 Nml Nml Nml Nml Nml 0 Nml Nml   Right ExtDigCom   Nml Nml Nml Nml Nml 0 Nml Nml   Right Triceps Radial C6-7-8 Nml Nml Nml Nml Nml 0 Nml Nml   Right  Deltoid Axillary C5-6 Nml Nml Nml Nml Nml 0 Nml Nml     Nerve Conduction Studies Anti Sensory Left/Right Comparison   Stim Site L Lat (ms) R Lat (ms) L-R Lat (ms) L Amp (V) R Amp (V) L-R Amp (%) Site1 Site2 L Vel (m/s) R Vel (m/s) L-R Vel (m/s)  Median Acr Palm Anti Sensory (2nd Digit)  31.2C  Wrist  *5.4   11.0  Wrist Palm     Palm             Radial Anti Sensory (Base 1st Digit)  31C  Wrist  2.3   44.2  Wrist Base 1st Digit     Ulnar Anti Sensory (5th Digit)  31.4C  Wrist  3.3   27.6  Wrist 5th Digit  42    Motor Left/Right Comparison   Stim Site L Lat (ms) R Lat (ms) L-R Lat (ms) L Amp (mV) R Amp (mV) L-R Amp (%) Site1 Site2 L Vel (m/s) R Vel (m/s) L-R Vel (m/s)  Median Motor (Abd  Poll Brev)  31C  Wrist  *4.6   7.2  Elbow Wrist  *46   Elbow  8.8   6.6        Ulnar Motor (Abd Dig Min)  31.2C  Wrist  2.8   11.4  B Elbow Wrist  60   B Elbow  5.8   11.2  A Elbow B Elbow  60   A Elbow  7.3   10.3           Waveforms:

## 2022-01-01 NOTE — Progress Notes (Signed)
I called and scheduled her to see Dr. Tempie Donning, for 01/03/22 @ 230pm

## 2022-01-03 ENCOUNTER — Encounter: Payer: Self-pay | Admitting: Orthopedic Surgery

## 2022-01-03 ENCOUNTER — Ambulatory Visit: Payer: Medicare Other | Admitting: Orthopedic Surgery

## 2022-01-03 DIAGNOSIS — G5601 Carpal tunnel syndrome, right upper limb: Secondary | ICD-10-CM | POA: Diagnosis not present

## 2022-01-03 NOTE — Progress Notes (Signed)
Office Visit Note   Patient: [name]           Date of Birth: 1946/07/29           MRN: [id number] Visit Date: 01/03/2022              Requested by: [name], MD [facility] [employer] [address] [place],  [place] [address] PCP: [name], MD   Assessment & Plan: Visit Diagnoses:  1. Carpal tunnel syndrome, right upper limb     Plan: Discussed with patient that her history, exam, and electrodiagnostic findings are most consistent with right carpal tunnel syndrome.  We reviewed the nature of carpal tunnel syndrome as well as diagnosis, prognosis, both conservative and surgical treatment options.  We discussed the risk of surgery including bleeding, infection, damage to neurovascular structures, incomplete symptom relief, need for additional surgery.  After our discussion, she would like to proceed with a right carpal tunnel release in the office.  A surgical date and time will be confirmed with the patient.  Follow-Up Instructions: No follow-ups on file.   Orders:  No orders of the defined types were placed in this encounter.  No orders of the defined types were placed in this encounter.     Procedures: No procedures performed   Clinical Data: No additional findings.   Subjective: Chief Complaint  Patient presents with   Right Wrist - Follow-up    This is a 76 year old right-hand-dominant female who presents with numbness and paresthesias involving the right upper extremity.  Is been going on for years now but is progressively worsened.  She describes numbness and paresthesias in the thumb, index, middle, and ring fingers.  This wakes her up from sleep every single night and requires her to shake her hands for symptom relief.  She does have daytime symptoms as well.  She notes that her fingers "feel funny" today.  She has not had any treatment so far.  She had a recent EMG/nerve conduction study on 12/27/2021 which suggested moderate right carpal tunnel  syndrome.   Review of Systems   Objective: Vital Signs: There were no vitals taken for this visit.  Physical Exam Constitutional:      Appearance: Normal appearance.  Cardiovascular:     Rate and Rhythm: Normal rate.     Pulses: Normal pulses.  Pulmonary:     Effort: Pulmonary effort is normal.  Skin:    General: Skin is warm and dry.     Capillary Refill: Capillary refill takes less than 2 seconds.  Neurological:     Mental Status: She is alert.    Right Hand Exam   Tenderness  The patient is experiencing no tenderness.   Range of Motion  The patient has normal right wrist ROM.   Other  Erythema: absent Sensation: normal Pulse: present  Comments:  Positive Tinel sign at wrist.  Positive Durkan sign at 5 seconds.  Equivocal Phalen sign.  5/5 thenar motor strength.      Specialty Comments:  MRI CERVICAL SPINE WITHOUT CONTRAST   TECHNIQUE: Multiplanar, multisequence MR imaging of the cervical spine was performed. No intravenous contrast was administered.   COMPARISON:  Cervical spine MRI 05/06/2014 report, images not retrievable at the time of this dictation.   FINDINGS: Alignment: Trace anterolisthesis at C4-C5.   Vertebrae: No fracture, evidence of discitis, or aggressive osseous lesion.   Cord: Normal signal and morphology.   Posterior Fossa, vertebral arteries, paraspinal tissues: Negative.   Disc levels: C1-C2  degenerative changes. Patent craniocervical junction.   C2-C3: No significant spinal canal or neural foraminal stenosis.   C3-C4: Mild disc bulging with mild facet arthropathy right worse than left. Mild spinal canal stenosis. Mild bilateral neural foraminal stenosis.   C4-C5: Trace anterolisthesis with minimal disc bulging, uncinate spurring and bilateral facet arthropathy, right worse than left. Mild spinal canal stenosis. Moderate right and mild left neural foraminal stenosis.   C5-C6: Prior ACDF with bony fusion. Patent spinal  canal and neural foramina.   C6-C7: Asymmetric right disc bulging with left greater than right facet arthropathy. No significant spinal canal stenosis. Mild right neural foraminal narrowing.   C7-T1: No significant spinal canal or neural foraminal narrowing.   IMPRESSION: Multilevel degenerative changes of the cervical spine, summarized below:   C3-C4: Mild spinal canal stenosis. Mild bilateral neural foraminal stenosis.   C4-C5: Mild spinal canal stenosis. Moderate right and mild left neural foraminal stenosis.   C5-C6: Prior ACDF with solid bony fusion. Patent spinal canal and neural foramina.   C6-C7: Mild right neural foraminal stenosis.     Electronically Signed   By: Maurine Simmering M.D.   On: 09/16/2021 15:57  Imaging: No results found.   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  09/26/2013   Coronary vasospasm ([facility])    Diabetes mellitus without complication ([facility])    type II   GERD (gastroesophageal reflux disease)    Headache    Hiatal hernia    Hypercholesteremia    Hypertension    NSTEMI (non-ST elevated myocardial infarction) ([facility]) not sure   NSVT  (nonsustained ventricular tachycardia) ([facility]) 09/26/2013   Panic attack     Family History  Problem Relation Age of Onset   Heart failure Mother    Stroke Sister    Heart attack Other     Past Surgical History:  Procedure Laterality Date   ABDOMINAL HYSTERECTOMY     PARTIAL HYSTERECTOMY   ACDF N/A    C5-6 ACDF Dr. Sherwood Gambler   breast     right, tumor benign   BREAST EXCISIONAL BIOPSY Right    1961 (age 36) benign   CARDIAC CATHETERIZATION  2014   CARDIAC CATHETERIZATION  2012   CARDIAC CATHETERIZATION N/A 07/22/2016   Procedure: Left Heart Cath and Coronary Angiography;  Surgeon: Belva Crome, MD;  Location: [facility];  Service: Cardiovascular;  Laterality: N/A;   cardiac stent  2012   to RCA   CHOLECYSTECTOMY N/A 10/02/2017   Procedure: LAPAROSCOPIC CHOLECYSTECTOMY;  Surgeon: Ralene Ok, MD;  Location: [facility];  Service: General;  Laterality: N/A;   CORONARY  ANGIOPLASTY     ESOPHAGEAL MANOMETRY N/A 10/29/2016   Procedure: ESOPHAGEAL MANOMETRY (EM);  Surgeon: Garlan Fair, MD;  Location: [facility];  Service: Endoscopy;  Laterality: N/A;   ESOPHAGOGASTRODUODENOSCOPY (EGD) WITH PROPOFOL N/A 10/28/2016   Procedure: ESOPHAGOGASTRODUODENOSCOPY (EGD) WITH PROPOFOL;  Surgeon: Garlan Fair, MD;  Location: [facility];  Service: Endoscopy;  Laterality: N/A;   EXCISION OF SKIN TAG N/A 07/29/2017   Procedure: EXCISION OF PERIANAL  SKIN TAG;  Surgeon: Ralene Ok, MD;  Location: [facility];  Service: General;  Laterality: N/A;   KNEE SURGERY  left   left ear surgery     for bad cut   LEFT HEART CATHETERIZATION WITH CORONARY ANGIOGRAM N/A 02/22/2013   Procedure: LEFT HEART CATHETERIZATION WITH CORONARY ANGIOGRAM;  Surgeon: [name], MD;  Location: [facility];  Service: Cardiovascular;  Laterality: N/A;   SHOULDER SURGERY Bilateral    rotator cuff   Social History   Occupational History   Not on file  Tobacco Use   Smoking status: Some Days     Packs/day: 0.50    Years: 40.00    Pack years: 20.00    Types: Cigarettes    Last attempt to quit: 01/10/2016    Years since quitting: 5.9   Smokeless tobacco: Never   Tobacco comments:    occasional   Vaping Use   Vaping Use: Never used  Substance and Sexual Activity   Alcohol use: Yes    Alcohol/week: 1.0 standard drink    Types: 1 Cans of beer per week    Comment: occasional beer   Drug use: No   Sexual activity: Never

## 2022-01-24 ENCOUNTER — Ambulatory Visit: Payer: Medicare Other | Admitting: Orthopedic Surgery

## 2022-01-24 ENCOUNTER — Ambulatory Visit (INDEPENDENT_AMBULATORY_CARE_PROVIDER_SITE_OTHER): Payer: Medicare Other | Admitting: Orthopedic Surgery

## 2022-01-24 DIAGNOSIS — G5601 Carpal tunnel syndrome, right upper limb: Secondary | ICD-10-CM | POA: Diagnosis not present

## 2022-01-24 MED ORDER — OXYCODONE HCL 5 MG PO TABS: 5.0000 mg | ORAL_TABLET | Freq: Four times a day (QID) | ORAL | 0 refills | Status: AC | PRN

## 2022-01-24 NOTE — Addendum Note (Signed)
Addended by: Sherilyn Cooter on: 01/24/2022 09:56 AM   Modules accepted: Orders

## 2022-01-24 NOTE — Progress Notes (Signed)
   Date of Surgery: 01/24/2022  INDICATIONS: Patient is a 75 y.o.-year-old female with right carpal tunnel syndrome that was confirmed on electrodiagnostic studies and has failed conservative management.  Risks, benefits, and alternatives to surgery were again discussed with the patient in the preoperative area. The patient wishes to proceed with surgery.  Informed consent was signed after our discussion.   PREOPERATIVE DIAGNOSIS:  Right carpal tunnel syndrome  POSTOPERATIVE DIAGNOSIS: Same.  PROCEDURE:  Right carpal tunnel release   SURGEON: Audria Nine, M.D.  ASSIST:   ANESTHESIA:  Local  IV FLUIDS AND URINE: See anesthesia.  ESTIMATED BLOOD LOSS: <5 mL.  IMPLANTS: * No surgical log found *   DRAINS: None  COMPLICATIONS: None  DESCRIPTION OF PROCEDURE: The patient was met in the preoperative holding room where the surgical site was marked and the consent form was verified.  A local block was performed using 15cc of 1% plain lidocaine. The patient was then taken to the procedure room and transferred to the operating table.  A tourniquet was applied to the right forearm.  The operative extremity was prepped and draped in the usual and sterile fashion.  A formal time-out was performed to confirm that this was the correct patient, surgery, side, and site.   Following a second timeout, the limb was gently exsanguinated and the tourniquet inflated to 250 mmHg.  A longitudinal incision was made in line with the radial border of the ring finger from distal to the wrist flexion crease to the intersection of Kaplan's cardinal line.  The skin and subcutaneous tissue was sharply divided.  The longitudinally running palmar fascia was incised.  The thenar musculature was bluntly swept off of the transverse carpal ligament.  There was a trans-ligamentous motor branch coming from the volar most portion of the nerve that was identified and protected. The ligament was divided from proximal to  distal until the fat surrounding the palmar arch was encountered. Retractors were then placed in the proximal aspect of the wound to visualize the distal antebrachial fascia.  The fascia was sharply divided under direct visualization.   The wound was then thoroughly irrigated with sterile saline.  The tourniquet was deflated.  Hemostasis was achieved with direct pressure on the incision.  The wound was then closed with 4-0 nylon sutures in a horizontal mattress fashion. The wound was then dressed with xeroform, 4x4 gauze, and an ace wrap.  All counts were correct x 2 at the end of the procedure.  The patient was taken to the recovery area in stable condition.     POSTOPERATIVE PLAN: She will be discharged to home with appropriate pain medication and discharge instructions.  I will see her in 10-14 days for her first postop visit.   Audria Nine, MD 9:23 AM

## 2022-01-24 NOTE — Progress Notes (Signed)
Pt presents today for right carpal tunnel release in the office.  We again reviewed the nature of carpal tunnel syndrome as well as both conservative and surgical treatment options.  We again reviewed the risks of surgery which include, but are not limited to, bleeding, infection, damage to the median nerve and its branches, incomplete symptom relief, delayed wound healing, and need for additional surgeries.  All questions were answered.  Informed consent was signed.

## 2022-01-28 DIAGNOSIS — E782 Mixed hyperlipidemia: Secondary | ICD-10-CM | POA: Diagnosis not present

## 2022-01-28 DIAGNOSIS — I10 Essential (primary) hypertension: Secondary | ICD-10-CM | POA: Diagnosis not present

## 2022-01-28 DIAGNOSIS — G4700 Insomnia, unspecified: Secondary | ICD-10-CM | POA: Diagnosis not present

## 2022-01-28 DIAGNOSIS — E1169 Type 2 diabetes mellitus with other specified complication: Secondary | ICD-10-CM | POA: Diagnosis not present

## 2022-01-28 DIAGNOSIS — I2510 Atherosclerotic heart disease of native coronary artery without angina pectoris: Secondary | ICD-10-CM | POA: Diagnosis not present

## 2022-01-28 DIAGNOSIS — K219 Gastro-esophageal reflux disease without esophagitis: Secondary | ICD-10-CM | POA: Diagnosis not present

## 2022-01-31 ENCOUNTER — Ambulatory Visit (INDEPENDENT_AMBULATORY_CARE_PROVIDER_SITE_OTHER): Payer: Medicare Other | Admitting: Orthopedic Surgery

## 2022-01-31 DIAGNOSIS — G5601 Carpal tunnel syndrome, right upper limb: Secondary | ICD-10-CM

## 2022-02-07 ENCOUNTER — Ambulatory Visit (INDEPENDENT_AMBULATORY_CARE_PROVIDER_SITE_OTHER): Payer: Medicare Other | Admitting: Orthopedic Surgery

## 2022-02-07 DIAGNOSIS — G5601 Carpal tunnel syndrome, right upper limb: Secondary | ICD-10-CM

## 2022-02-07 NOTE — Progress Notes (Signed)
Post-Op Visit Note   Patient: [name]           Date of Birth: 1946/01/25           MRN: [id number] Visit Date: 02/07/2022 PCP: [name], MD   Assessment & Plan:  Chief Complaint:  Chief Complaint  Patient presents with   Right Hand - Routine Post Op    Carpal Tunnel Release on 01/24/22   Visit Diagnoses:  1. Carpal tunnel syndrome, right upper limb     Plan: Patient is two weeks s/p right carpal tunnel release.  She is doing well postoperatively.  She has full thenar motor function.  The incision is clean, dry, and well approximated.  The nylon sutures were removed.  Her previous nocturnal symptoms in her hand have resolved though she reports continued nocturnal shoulder pain which is a chronic issue for her.  She would like to referred to someone for her shoulder.   Follow-Up Instructions: No follow-ups on file.   Orders:  No orders of the defined types were placed in this encounter.  No orders of the defined types were placed in this encounter.   Imaging: No results found.  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  09/26/2013   Coronary vasospasm (HCC)     Diabetes mellitus without complication (HCC)    type II   GERD (gastroesophageal reflux disease)    Headache    Hiatal hernia    Hypercholesteremia    Hypertension    NSTEMI (non-ST elevated myocardial infarction) ([place]) not sure   NSVT (nonsustained ventricular tachycardia) ([place]) 09/26/2013   Panic attack     Family History  Problem Relation Age of Onset   Heart failure Mother    Stroke Sister    Heart attack Other     Past Surgical History:  Procedure Laterality Date   ABDOMINAL HYSTERECTOMY     PARTIAL HYSTERECTOMY   ACDF N/A    C5-6 ACDF Dr. Sherwood Gambler   breast     right, tumor benign   BREAST EXCISIONAL BIOPSY Right    1961 (age 32) benign   CARDIAC CATHETERIZATION  2014   CARDIAC CATHETERIZATION  2012   CARDIAC CATHETERIZATION N/A 07/22/2016   Procedure: Left Heart Cath and Coronary Angiography;  Surgeon: Belva Crome, MD;  Location: [place] [facility];  Service: Cardiovascular;  Laterality: N/A;   cardiac stent  2012   to RCA   CHOLECYSTECTOMY N/A 10/02/2017   Procedure: LAPAROSCOPIC CHOLECYSTECTOMY;  Surgeon: Ralene Ok, MD;  Location: [place];  Service: General;  Laterality: N/A;   CORONARY ANGIOPLASTY  ESOPHAGEAL MANOMETRY N/A 10/29/2016   Procedure: ESOPHAGEAL MANOMETRY (EM);  Surgeon: Garlan Fair, MD;  [facility];  Service: Endoscopy;  Laterality: N/A;   ESOPHAGOGASTRODUODENOSCOPY (EGD) WITH PROPOFOL N/A 10/28/2016   Procedure: ESOPHAGOGASTRODUODENOSCOPY (EGD) WITH PROPOFOL;  Surgeon: Garlan Fair, MD;  [facility];  Service: Endoscopy;  Laterality: N/A;   EXCISION OF SKIN TAG N/A 07/29/2017   Procedure: EXCISION OF PERIANAL  SKIN TAG;  Surgeon: Ralene Ok, MD;  Location: [place];  Service: General;  Laterality: N/A;   KNEE SURGERY  left   left ear surgery     for bad cut   LEFT HEART CATHETERIZATION WITH CORONARY ANGIOGRAM N/A 02/22/2013   Procedure: LEFT HEART CATHETERIZATION WITH CORONARY ANGIOGRAM;  Surgeon:  [name], MD;  Location: [facility];  Service: Cardiovascular;  Laterality: N/A;   SHOULDER SURGERY Bilateral    rotator cuff   Social History   Occupational History   Not on file  Tobacco Use   Smoking status: Some Days    Packs/day: 0.50    Years: 40.00    Total pack years: 20.00    Types: Cigarettes    Last attempt to quit: 01/10/2016    Years since quitting: 6.0   Smokeless tobacco: Never   Tobacco comments:    occasional   Vaping Use   Vaping Use: Never used  Substance and Sexual Activity   Alcohol use: Yes    Alcohol/week: 1.0 standard drink of alcohol    Types: 1 Cans of beer per week    Comment: occasional beer   Drug use: No   Sexual activity: Never

## 2022-02-12 ENCOUNTER — Telehealth: Payer: Self-pay | Admitting: Orthopedic Surgery

## 2022-02-12 ENCOUNTER — Other Ambulatory Visit: Payer: Self-pay | Admitting: Physician Assistant

## 2022-02-12 MED ORDER — PREGABALIN 50 MG PO CAPS: ORAL_CAPSULE | ORAL | 1 refills | Status: DC

## 2022-02-12 NOTE — Telephone Encounter (Signed)
Pt would like to talk to [name]

## 2022-02-13 NOTE — Telephone Encounter (Signed)
This has already been addressed with [facility] see she sent in Lyrica for this patient for pain

## 2022-02-19 ENCOUNTER — Ambulatory Visit (HOSPITAL_BASED_OUTPATIENT_CLINIC_OR_DEPARTMENT_OTHER): Payer: Self-pay | Admitting: Orthopaedic Surgery

## 2022-02-19 ENCOUNTER — Ambulatory Visit (INDEPENDENT_AMBULATORY_CARE_PROVIDER_SITE_OTHER): Payer: Medicare Other | Admitting: Orthopaedic Surgery

## 2022-02-19 ENCOUNTER — Ambulatory Visit (INDEPENDENT_AMBULATORY_CARE_PROVIDER_SITE_OTHER): Payer: Medicare Other

## 2022-02-19 DIAGNOSIS — M25511 Pain in right shoulder: Secondary | ICD-10-CM

## 2022-02-19 DIAGNOSIS — M12811 Other specific arthropathies, not elsewhere classified, right shoulder: Secondary | ICD-10-CM

## 2022-02-19 DIAGNOSIS — G8929 Other chronic pain: Secondary | ICD-10-CM

## 2022-02-19 MED ORDER — ACETAMINOPHEN 500 MG PO TABS: 500.0000 mg | ORAL_TABLET | Freq: Three times a day (TID) | ORAL | 0 refills | Status: AC

## 2022-02-19 MED ORDER — IBUPROFEN 800 MG PO TABS: 800.0000 mg | ORAL_TABLET | Freq: Three times a day (TID) | ORAL | 0 refills | Status: AC

## 2022-02-19 MED ORDER — ASPIRIN 325 MG PO TBEC: 325.0000 mg | DELAYED_RELEASE_TABLET | Freq: Every day | ORAL | 0 refills | Status: DC

## 2022-02-19 MED ORDER — OXYCODONE HCL 5 MG PO TABS: 5.0000 mg | ORAL_TABLET | ORAL | 0 refills | Status: DC | PRN

## 2022-02-19 NOTE — Progress Notes (Signed)
Chief Complaint: Right shoulder pain     History of Present Illness:    [name] is a 76 y.o. female presents today with right shoulder pain as a referral from my partner Dr. Tempie Donning.  She states that she has had right shoulder pain on and off for the last several years.  She did have several injections by Dr. Sharol Given 1 year prior which gave her transient relief but is subsequently worn off.  The second injection did not give lasting relief.  She has previously underwent carpal tunnel release on the right side with Dr. Tempie Donning.  She is right-hand dominant.  She did undergo a course of physical therapy the previous year although this did not significantly help pain and overhead activity.  She is experiencing pain with any type of overhead activity.  Does have a very distant history of rotator cuff repair with Dr. Sharol Given many years prior.  She is not able to specifically remember what year it was although this was several years ago.  She states that the pain feels similar to this.  This is keeping her awake at night.  She has trialed lidocaine patches as well as over-the-counter creams with little relief.  She does smoke cigarettes.  I specifically talked with her and counseled her on this.  I will plan to make a referral to smoking cessation regarding this as well.    Surgical History:   Distant history of right shoulder rotator cuff repair with Dr. Sharol Given several years prior  PMH/PSH/Family History/Social History/Meds/Allergies:    Past Medical History:  Diagnosis Date   Anemia    Anginal pain ([place])    last cp was last year   Anxiety    Arthritis    Blood transfusion    Chest pain 02/04/2016   Coronary artery disease    Coronary artery spasm, wth continued episodes of chest pain.  09/26/2013   Coronary vasospasm (HCC)    Diabetes mellitus without complication (HCC)    type II   GERD (gastroesophageal reflux disease)    Headache    Hiatal hernia     Hypercholesteremia    Hypertension    NSTEMI (non-ST elevated myocardial infarction) ([place]) not sure   NSVT (nonsustained ventricular tachycardia) ([place]) 09/26/2013   Panic attack    Past Surgical History:  Procedure Laterality Date   ABDOMINAL HYSTERECTOMY     PARTIAL HYSTERECTOMY   ACDF N/A    C5-6 ACDF Dr. Sherwood Gambler   breast     right, tumor benign   BREAST EXCISIONAL BIOPSY Right    1961 (age 30) benign   CARDIAC CATHETERIZATION  2014   CARDIAC CATHETERIZATION  2012   CARDIAC CATHETERIZATION N/A 07/22/2016   Procedure: Left Heart Cath and Coronary Angiography;  Surgeon: Belva Crome, MD;  Location: [place] [facility];  Service: Cardiovascular;  Laterality: N/A;   cardiac stent  2012   to RCA   CHOLECYSTECTOMY N/A 10/02/2017   Procedure: LAPAROSCOPIC CHOLECYSTECTOMY;  Surgeon: Ralene Ok, MD;  Location: [place];  Service: General;  Laterality: N/A;   CORONARY ANGIOPLASTY     ESOPHAGEAL MANOMETRY N/A 10/29/2016   Procedure: ESOPHAGEAL MANOMETRY (EM);  Surgeon: Garlan Fair, MD;  [facility];  Service: Endoscopy;  Laterality: N/A;   ESOPHAGOGASTRODUODENOSCOPY (EGD) WITH PROPOFOL N/A 10/28/2016   Procedure:  ESOPHAGOGASTRODUODENOSCOPY (EGD) WITH PROPOFOL;  Surgeon: Garlan Fair, MD;  [facility];  Service: Endoscopy;  Laterality: N/A;   EXCISION OF SKIN TAG N/A 07/29/2017   Procedure: EXCISION OF PERIANAL  SKIN TAG;  Surgeon: Ralene Ok, MD;  Location: [place];  Service: General;  Laterality: N/A;   KNEE SURGERY  left   left ear surgery     for bad cut   LEFT HEART CATHETERIZATION WITH CORONARY ANGIOGRAM N/A 02/22/2013   Procedure: LEFT HEART CATHETERIZATION WITH CORONARY ANGIOGRAM;  Surgeon: [name], MD;  Location: [facility];  Service: Cardiovascular;  Laterality: N/A;   SHOULDER SURGERY Bilateral    rotator cuff   Social History   Socioeconomic History   Marital status: Married    Spouse name: [name]   Number of children: 0    Years of education: Not on file   Highest education level: Not on file  Occupational History   Not on file  Tobacco Use   Smoking status: Some Days    Packs/day: 0.50    Years: 40.00    Total pack years: 20.00    Types: Cigarettes    Last attempt to quit: 01/10/2016    Years since quitting: 6.1   Smokeless tobacco: Never   Tobacco comments:    occasional   Vaping Use   Vaping Use: Never used  Substance and Sexual Activity   Alcohol use: Yes    Alcohol/week: 1.0 standard drink of alcohol    Types: 1 Cans of beer per week    Comment: occasional beer   Drug use: No   Sexual activity: Never  Other Topics Concern   Not on file  Social History Narrative   Lives with husband. Ambulates independently.   Social Determinants of Health   Financial Resource Strain: Not on file  Food Insecurity: Not on file  Transportation Needs: Not on file  Physical Activity: Not on file  Stress: Not on file  Social Connections: Not on file   Family History  Problem Relation Age of Onset   Heart failure Mother    Stroke Sister    Heart attack Other    No Known Allergies Current Outpatient Medications  Medication Sig Dispense Refill   ACCU-CHEK GUIDE test strip USE TO CHECK BLOOD SUGAR DAILY     Accu-Chek Softclix Lancets lancets daily.     acetaminophen (TYLENOL) 500 MG tablet Take 500 mg by mouth daily as needed for moderate pain or headache.     amLODipine (NORVASC) 2.5 MG tablet Take 1 tablet (2.5 mg total) by mouth daily. 30 tablet 1   aspirin EC 81 MG tablet Take 81 mg by mouth daily.     buPROPion (ZYBAN) 150 MG 12 hr tablet Take 150 mg by mouth every morning.     dapagliflozin propanediol (FARXIGA) 10 MG TABS tablet Take 1 tablet (10 mg total) by mouth daily before breakfast. 30 tablet 11   metFORMIN (GLUCOPHAGE-XR) 500 MG 24 hr tablet Take 500 mg by mouth 2 (two) times daily.     metoprolol tartrate (LOPRESSOR) 100 MG tablet Take one tablet by mouth 2 hours prior to CT 1 tablet 0    nitroGLYCERIN (NITROSTAT) 0.4 MG SL tablet PLACE 1 TABLET UNDER TONGUE AS NEEDED FOR CHEST PAIN EVERY 5 MINUTES UP TO 3 DOSES THEN SEEK MEDICAL ATTENTION. Please make overdue appt. 1st attempt 25 tablet 0   pantoprazole (PROTONIX) 40 MG tablet Take 1 tablet (40 mg total) by mouth 2 (  two) times daily. 60 tablet 2   predniSONE (DELTASONE) 50 MG tablet Take one tablet by mouth once daily for 5 days. 5 tablet 0   pregabalin (LYRICA) 50 MG capsule Take 1 cap by mouth at night for 7 nights then take 1 cap by mouth in the morning and 1 at night. 30 capsule 1   ranitidine (ZANTAC) 150 MG tablet Take 150 mg by mouth 2 (two) times daily.      ranolazine (RANEXA) 500 MG 12 hr tablet TAKE 1 TABLET(500 MG) BY MOUTH TWICE DAILY (Patient taking differently: Take 500 mg by mouth 2 (two) times daily.) 60 tablet 11   rosuvastatin (CRESTOR) 5 MG tablet Take 1 tablet (5 mg total) by mouth daily at 6 PM. (Patient taking differently: Take 5 mg by mouth daily.) 30 tablet 1   senna-docusate (SENOKOT-S) 8.6-50 MG tablet Take 1 tablet by mouth at bedtime as needed for mild constipation or moderate constipation. (Patient taking differently: Take 1 tablet by mouth daily as needed for mild constipation or moderate constipation.) 20 tablet 0   temazepam (RESTORIL) 15 MG capsule Take 15 mg by mouth daily as needed for sleep.     traZODone (DESYREL) 50 MG tablet Take 50 mg by mouth as needed for sleep.     No current facility-administered medications for this visit.   No results found.  Review of Systems:   A ROS was performed including pertinent positives and negatives as documented in the HPI.  Physical Exam :   Constitutional: NAD and appears stated age Neurological: Alert and oriented Psych: Appropriate affect and cooperative There were no vitals taken for this visit.   Comprehensive Musculoskeletal Exam:    Musculoskeletal Exam    Inspection Right Left  Skin No atrophy or winging No atrophy or winging   Palpation    Tenderness Glenohumeral, lateral deltoid None  Range of Motion    Flexion (passive) 150 170  Flexion (active) 140 170  Abduction 140 170  ER at the side 30 70  Can reach behind back to Back pocket T12  Strength     4-5 supra and infraspinatus, negative belly press Full  Special Tests    Pseudoparalytic No No  Neurologic    Fires PIN, radial, median, ulnar, musculocutaneous, axillary, suprascapular, long thoracic, and spinal accessory innervated muscles. No abnormal sensibility  Vascular/Lymphatic    Radial Pulse 2+ 2+  Cervical Exam    Patient has symmetric cervical range of motion with negative Spurling's test.  Special Test:      Imaging:   Xray (right shoulder 3 views): There is evidence of glenohumeral degenerative findings as well as rotator cuff arthropathy with superior migration of the humeral head  MRI (right shoulder): Previous rotator cuff tear with significant atrophy of the supraspinatus muscle  I personally reviewed and interpreted the radiographs.   Assessment:   76 y.o. female right-hand-dominant female presents with right shoulder pain in the setting of rotator cuff arthropathy status post previous rotator cuff repair.  At today's visit I described that she has trialed many forms of conservative management including physical therapy injections and over-the-counter creams.  She continues to have significant limitations with overhead activity.  To that effect I do believe that this is a result of the superior migration of her humeral head.  We did discuss additional treatment options.  Specifically I do believe that she would be a candidate for reverse shoulder arthroplasty given the fact that she has failed many forms of nonoperative  management.  She is currently diabetic although she is not taking any insulin.  I described that this in addition to smoking both can lead to a higher risk of complication.  I specifically counseled her on smoking cessation  and we will plan to make a referral to our smoking cessation team.  That being said she continues to be quite disabled and is hoping to undergo any type of intervention that would get her a lasting and durable shoulder with overhead activity.  To that effect after further discussion she has ultimately elected for right shoulder reverse shoulder arthroplasty  Plan :    -Plan for right shoulder reverse shoulder arthroplasty    After a lengthy discussion of treatment options, including risks, benefits, alternatives, complications of surgical and nonsurgical conservative options, the patient elected surgical repair.   The patient  is aware of the material risks  and complications including, but not limited to injury to adjacent structures, neurovascular injury, infection, numbness, bleeding, implant failure, thermal burns, stiffness, persistent pain, failure to heal, disease transmission from allograft, need for further surgery, dislocation, anesthetic risks, blood clots, risks of death,and others. The probabilities of surgical success and failure discussed with patient given their particular co-morbidities.The time and nature of expected rehabilitation and recovery was discussed.The patient's questions were all answered preoperatively.  No barriers to understanding were noted. I explained the natural history of the disease process and Rx rationale.  I explained to the patient what I considered to be reasonable expectations given their personal situation.  The final treatment plan was arrived at through a shared patient decision making process model.      I personally saw and evaluated the patient, and participated in the management and treatment plan.  Vanetta Mulders, MD Attending Physician, Orthopedic Surgery  This document was dictated using Dragon voice recognition software. A reasonable attempt at proof reading has been made to minimize errors.

## 2022-02-24 ENCOUNTER — Ambulatory Visit (INDEPENDENT_AMBULATORY_CARE_PROVIDER_SITE_OTHER): Payer: Medicare Other | Admitting: Orthopedic Surgery

## 2022-02-24 DIAGNOSIS — G5601 Carpal tunnel syndrome, right upper limb: Secondary | ICD-10-CM

## 2022-02-24 NOTE — Progress Notes (Signed)
Post-Op Visit Note   Patient: [name]           Date of Birth: [birth year]           MRN: [id number] Visit Date: 02/24/2022 PCP: [name], MD   Assessment & Plan:  Chief Complaint: No chief complaint on file.  Visit Diagnoses:  1. Carpal tunnel syndrome, right upper limb     Plan: Patient is now 4 weeks s/p R CTR.  She is doing well.  Her numbness has resolved.  She has full thenar motor function.  She does have some stiffness in her fingers with subjective "tightness" in her index and small finger with extension.  She would like to see a hand therapist to work on finger ROM.  She can start working on scar massage to soften the palmar incision.  She is scheduled for a reverse TSA in the future which will hopefully provide significant relief of her chronic shoulder pain.  I can see her back as needed.   Follow-Up Instructions: No follow-ups on file.   Orders:  No orders of the defined types were placed in this encounter.  No orders of the defined types were placed in this encounter.   Imaging: No results found.  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   09/26/2013   Coronary vasospasm (HCC)    Diabetes mellitus without complication (HCC)    type II   GERD (gastroesophageal reflux disease)    Headache    Hiatal hernia    Hypercholesteremia    Hypertension    NSTEMI (non-ST elevated myocardial infarction) ([place]) not sure   NSVT (nonsustained ventricular tachycardia) ([place]) 09/26/2013   Panic attack     Family History  Problem Relation Age of Onset   Heart failure Mother    Stroke Sister    Heart attack Other     Past Surgical History:  Procedure Laterality Date   ABDOMINAL HYSTERECTOMY     PARTIAL HYSTERECTOMY   ACDF N/A    C5-6 ACDF Dr. Sherwood Gambler   breast     right, tumor benign   BREAST EXCISIONAL BIOPSY Right    1961 (age 71) benign   CARDIAC CATHETERIZATION  2014   CARDIAC CATHETERIZATION  2012   CARDIAC CATHETERIZATION N/A 07/22/2016   Procedure: Left Heart Cath and Coronary Angiography;  Surgeon: Belva Crome, MD;  Location: [place] [facility];  Service: Cardiovascular;  Laterality: N/A;   cardiac stent  2012   to RCA   CHOLECYSTECTOMY N/A 10/02/2017   Procedure: LAPAROSCOPIC CHOLECYSTECTOMY;  Surgeon: Ralene Ok, MD;  Location: [place];  Service: General;  Laterality: N/A;   CORONARY ANGIOPLASTY     ESOPHAGEAL MANOMETRY N/A 10/29/2016   Procedure: ESOPHAGEAL MANOMETRY (EM);  Surgeon: Garlan Fair, MD;  [facility];  Service: Endoscopy;  Laterality: N/A;   ESOPHAGOGASTRODUODENOSCOPY (EGD) WITH PROPOFOL N/A 10/28/2016   Procedure: ESOPHAGOGASTRODUODENOSCOPY (EGD) WITH PROPOFOL;  Surgeon: Garlan Fair, MD;  [facility];  Service: Endoscopy;  Laterality: N/A;   EXCISION OF SKIN TAG N/A 07/29/2017   Procedure: EXCISION OF PERIANAL  SKIN TAG;  Surgeon: Ralene Ok, MD;  Location: [place];  Service: General;  Laterality: N/A;   KNEE SURGERY  left   left ear surgery     for bad cut   LEFT HEART CATHETERIZATION WITH CORONARY ANGIOGRAM N/A 02/22/2013   Procedure: LEFT HEART CATHETERIZATION  WITH CORONARY ANGIOGRAM;  Surgeon: [name], MD;  Location: [facility];  Service: Cardiovascular;  Laterality: N/A;   SHOULDER SURGERY Bilateral    rotator cuff   Social History   Occupational History   Not on file  Tobacco Use   Smoking status: Some Days    Packs/day: 0.50    Years: 40.00    Total pack years: 20.00    Types: Cigarettes    Last attempt to quit: 01/10/2016    Years since quitting: 6.1   Smokeless tobacco: Never   Tobacco comments:    occasional   Vaping Use   Vaping Use: Never used  Substance and Sexual Activity   Alcohol use: Yes    Alcohol/week: 1.0 standard drink of alcohol    Types: 1 Cans of beer per week    Comment: occasional beer   Drug use: No   Sexual activity: Never

## 2022-02-26 DIAGNOSIS — I10 Essential (primary) hypertension: Secondary | ICD-10-CM | POA: Diagnosis not present

## 2022-02-26 DIAGNOSIS — Z01818 Encounter for other preprocedural examination: Secondary | ICD-10-CM | POA: Diagnosis not present

## 2022-02-26 DIAGNOSIS — E782 Mixed hyperlipidemia: Secondary | ICD-10-CM | POA: Diagnosis not present

## 2022-02-26 DIAGNOSIS — M8588 Other specified disorders of bone density and structure, other site: Secondary | ICD-10-CM | POA: Diagnosis not present

## 2022-02-26 DIAGNOSIS — I700 Atherosclerosis of aorta: Secondary | ICD-10-CM | POA: Diagnosis not present

## 2022-02-26 DIAGNOSIS — E1169 Type 2 diabetes mellitus with other specified complication: Secondary | ICD-10-CM | POA: Diagnosis not present

## 2022-02-26 DIAGNOSIS — I25111 Atherosclerotic heart disease of native coronary artery with angina pectoris with documented spasm: Secondary | ICD-10-CM | POA: Diagnosis not present

## 2022-03-03 DIAGNOSIS — Z0000 Encounter for general adult medical examination without abnormal findings: Secondary | ICD-10-CM | POA: Diagnosis not present

## 2022-03-10 ENCOUNTER — Other Ambulatory Visit: Payer: Self-pay | Admitting: Orthopedic Surgery

## 2022-03-10 ENCOUNTER — Ambulatory Visit (HOSPITAL_BASED_OUTPATIENT_CLINIC_OR_DEPARTMENT_OTHER)
Admission: RE | Admit: 2022-03-10 | Discharge: 2022-03-10 | Disposition: A | Discharge status: Home / Self Care | Payer: Medicare Other | Source: Ambulatory Visit | Attending: Orthopaedic Surgery | Admitting: Orthopaedic Surgery

## 2022-03-10 ENCOUNTER — Telehealth: Payer: Self-pay | Admitting: Orthopedic Surgery

## 2022-03-10 DIAGNOSIS — M12811 Other specific arthropathies, not elsewhere classified, right shoulder: Secondary | ICD-10-CM | POA: Diagnosis not present

## 2022-03-10 DIAGNOSIS — M19011 Primary osteoarthritis, right shoulder: Secondary | ICD-10-CM | POA: Diagnosis not present

## 2022-03-10 DIAGNOSIS — G5601 Carpal tunnel syndrome, right upper limb: Secondary | ICD-10-CM

## 2022-03-10 NOTE — Telephone Encounter (Signed)
Patient called asked for a call back concerning her right hand. Patient said she can not pick up anything. Patient said she can not drive and pain is shooting through her hand. Patient said she haven't heard anything concerning (PT) for her hand. The number to contact patient is [phone]

## 2022-03-14 ENCOUNTER — Ambulatory Visit: Payer: Medicare Other | Admitting: Interventional Cardiology

## 2022-03-14 ENCOUNTER — Encounter: Payer: Self-pay | Admitting: Interventional Cardiology

## 2022-03-14 VITALS — BP 118/64 | HR 78 | Ht 59.0 in | Wt 112.0 lb

## 2022-03-14 DIAGNOSIS — G5601 Carpal tunnel syndrome, right upper limb: Secondary | ICD-10-CM

## 2022-03-14 DIAGNOSIS — I201 Angina pectoris with documented spasm: Secondary | ICD-10-CM

## 2022-03-14 DIAGNOSIS — I10 Essential (primary) hypertension: Secondary | ICD-10-CM

## 2022-03-14 DIAGNOSIS — I25118 Atherosclerotic heart disease of native coronary artery with other forms of angina pectoris: Secondary | ICD-10-CM

## 2022-03-14 DIAGNOSIS — E785 Hyperlipidemia, unspecified: Secondary | ICD-10-CM | POA: Diagnosis not present

## 2022-03-14 NOTE — Patient Instructions (Signed)
Medication Instructions:  ?Your physician recommends that you continue on your current medications as directed. Please refer to the Current Medication list given to you today. ? ?*If you need a refill on your cardiac medications before your next appointment, please call your pharmacy* ? ?Lab Work: ?NONE ? ?Testing/Procedures: ?NONE ? ?Follow-Up: ?At [facility], you and your health needs are our priority.  As part of our continuing mission to provide you with exceptional heart care, we have created designated Provider Care Teams.  These Care Teams include your primary Cardiologist (physician) and Advanced Practice Providers (APPs -  Physician Assistants and Nurse Practitioners) who all work together to provide you with the care you need, when you need it. ? ?Your next appointment:   ?9-12 month(s) ? ?The format for your next appointment:   ?In Person ? ?Provider:   ?Henry W Smith III, MD { ? ? ? ?Important Information About Sugar ? ? ? ? ?  ?

## 2022-03-14 NOTE — Progress Notes (Signed)
Cardiology Office Note:    Date:  03/14/2022   ID:  [name], DOB 11-30-1945, MRN [id number]  PCP:  [name], MD  Cardiologist:  [name], MD   Referring MD: [name], MD   No chief complaint on file.   History of Present Illness:    [name] is a 76 y.o. female with a hx of prior obstructive coronary disease with prior stenting, well documented recurrent coronary spasm, recurrent syncope with neurally mediated features, hypertension, hyperlipidemia, tobacco abuse, and gastroesophageal reflux.    She is doing well.  We discussed that she has 2 types of pathophysiology with the coronaries.  She has had fixed obstructive disease that is required at stenting but also has coronary epicardial and likely microvascular spasm.  Nitroglycerin relieves episodes of discomfort.  Angina is almost always in the setting of rest pain rather than exertion related.  Despite being on amlodipine and long-acting nitrate she occasionally has to use sublingual nitroglycerin and gets relief.  Last episode was last week.  Unfortunately she continues to smoke cigarettes.  We have talked for greater than 20 years about the impact of smoking as on endothelial dysfunction.  She is unable to quit.  Past Medical History:  Diagnosis Date   Anemia    Anginal pain ([place])    last cp was last year   Anxiety    Arthritis    Blood transfusion    Chest pain 02/04/2016   Coronary artery disease    Coronary artery spasm, wth continued episodes of chest pain.  09/26/2013   Coronary vasospasm (HCC)    Diabetes mellitus without complication (HCC)    type II   GERD (gastroesophageal reflux disease)    Headache    Hiatal hernia    Hypercholesteremia    Hypertension    NSTEMI (non-ST elevated myocardial infarction) ([place]) not sure   NSVT (nonsustained ventricular tachycardia) ([place]) 09/26/2013   Panic attack     Past Surgical History:  Procedure Laterality Date   ABDOMINAL HYSTERECTOMY      PARTIAL HYSTERECTOMY   ACDF N/A    C5-6 ACDF Dr. Sherwood Gambler   breast     right, tumor benign   BREAST EXCISIONAL BIOPSY Right    1961 (age 62) benign   CARDIAC CATHETERIZATION  2014   CARDIAC CATHETERIZATION  2012   CARDIAC CATHETERIZATION N/A 07/22/2016   Procedure: Left Heart Cath and Coronary Angiography;  Surgeon: Belva Crome, MD;  Location: [facility];  Service: Cardiovascular;  Laterality: N/A;   cardiac stent  2012   to RCA   CHOLECYSTECTOMY N/A 10/02/2017   Procedure: LAPAROSCOPIC CHOLECYSTECTOMY;  Surgeon: Ralene Ok, MD;  Location: [facility];  Service: General;  Laterality: N/A;   CORONARY ANGIOPLASTY     ESOPHAGEAL MANOMETRY N/A 10/29/2016   Procedure: ESOPHAGEAL MANOMETRY (EM);  Surgeon: Garlan Fair, MD;  [facility];  Service: Endoscopy;  Laterality: N/A;   ESOPHAGOGASTRODUODENOSCOPY (EGD) WITH PROPOFOL N/A 10/28/2016   Procedure: ESOPHAGOGASTRODUODENOSCOPY (EGD) WITH PROPOFOL;  Surgeon: Garlan Fair, MD;  [facility];  Service: Endoscopy;  Laterality: N/A;   EXCISION OF SKIN TAG N/A 07/29/2017   Procedure: EXCISION OF PERIANAL  SKIN TAG;  Surgeon: Ralene Ok, MD;  Location: [facility];  Service: General;  Laterality: N/A;   KNEE SURGERY  left   left ear surgery     for bad cut   LEFT HEART CATHETERIZATION WITH CORONARY ANGIOGRAM N/A 02/22/2013   Procedure: LEFT HEART  CATHETERIZATION WITH CORONARY ANGIOGRAM;  Surgeon: [name], MD;  Location: [facility];  Service: Cardiovascular;  Laterality: N/A;   SHOULDER SURGERY Bilateral    rotator cuff    Current Medications: Current Meds  Medication Sig   ACCU-CHEK GUIDE test strip USE TO CHECK BLOOD SUGAR DAILY   Accu-Chek Softclix Lancets lancets daily.   acetaminophen (TYLENOL) 500 MG tablet Take 500 mg by mouth daily as needed for moderate pain or headache.   amLODipine (NORVASC) 2.5 MG tablet Take 1 tablet (2.5 mg total) by mouth daily.   aspirin EC 81 MG tablet  Take 81 mg by mouth daily.   buPROPion (ZYBAN) 150 MG 12 hr tablet Take 150 mg by mouth every morning.   dapagliflozin propanediol (FARXIGA) 10 MG TABS tablet Take 1 tablet (10 mg total) by mouth daily before breakfast.   isosorbide mononitrate (IMDUR) 60 MG 24 hr tablet Take 1 tablet by mouth daily at 6 (six) AM.   metFORMIN (GLUCOPHAGE-XR) 500 MG 24 hr tablet Take 500 mg by mouth 2 (two) times daily.   metoprolol tartrate (LOPRESSOR) 100 MG tablet Take one tablet by mouth 2 hours prior to CT   nitroGLYCERIN (NITROSTAT) 0.4 MG SL tablet PLACE 1 TABLET UNDER TONGUE AS NEEDED FOR CHEST PAIN EVERY 5 MINUTES UP TO 3 DOSES THEN SEEK MEDICAL ATTENTION. Please make overdue appt. 1st attempt   oxyCODONE (OXY IR/ROXICODONE) 5 MG immediate release tablet Take 1 tablet (5 mg total) by mouth every 4 (four) hours as needed for severe pain.   pantoprazole (PROTONIX) 40 MG tablet Take 1 tablet (40 mg total) by mouth 2 (two) times daily.   pregabalin (LYRICA) 50 MG capsule Take 1 cap by mouth at night for 7 nights then take 1 cap by mouth in the morning and 1 at night.   ranitidine (ZANTAC) 150 MG tablet Take 150 mg by mouth 2 (two) times daily.    ranolazine (RANEXA) 500 MG 12 hr tablet TAKE 1 TABLET(500 MG) BY MOUTH TWICE DAILY (Patient taking differently: Take 500 mg by mouth 2 (two) times daily.)   rosuvastatin (CRESTOR) 5 MG tablet Take 1 tablet (5 mg total) by mouth daily at 6 PM. (Patient taking differently: Take 5 mg by mouth daily.)   senna-docusate (SENOKOT-S) 8.6-50 MG tablet Take 1 tablet by mouth at bedtime as needed for mild constipation or moderate constipation. (Patient taking differently: Take 1 tablet by mouth daily as needed for mild constipation or moderate constipation.)   temazepam (RESTORIL) 15 MG capsule Take 15 mg by mouth daily as needed for sleep.   traZODone (DESYREL) 50 MG tablet Take 50 mg by mouth as needed for sleep.     Allergies:   Patient has no known allergies.   Social  History   Socioeconomic History   Marital status: Married    Spouse name: [name]   Number of children: 0   Years of education: Not on file   Highest education level: Not on file  Occupational History   Not on file  Tobacco Use   Smoking status: Some Days    Packs/day: 0.50    Years: 40.00    Total pack years: 20.00    Types: Cigarettes    Last attempt to quit: 01/10/2016    Years since quitting: 6.1   Smokeless tobacco: Never   Tobacco comments:    occasional   Vaping Use   Vaping Use: Never used  Substance and Sexual Activity   Alcohol use:  Yes    Alcohol/week: 1.0 standard drink of alcohol    Types: 1 Cans of beer per week    Comment: occasional beer   Drug use: No   Sexual activity: Never  Other Topics Concern   Not on file  Social History Narrative   Lives with husband. Ambulates independently.   Social Determinants of Health   Financial Resource Strain: Not on file  Food Insecurity: Not on file  Transportation Needs: Not on file  Physical Activity: Not on file  Stress: Not on file  Social Connections: Not on file     Family History: The patient's family history includes Heart attack in an other family member; Heart failure in her mother; Stroke in her sister.  ROS:   Please see the history of present illness.    No claudication.  Some chronic shortness of breath.  Having difficulty with her right hand strength after carpal tunnel surgery.  All other systems reviewed and are negative.  EKGs/Labs/Other Studies Reviewed:    The following studies were reviewed today: No new imaging  EKG:  EKG normal sinus rhythm, biatrial abnormality, anterolateral T wave inversion, PVC, nonspecific T wave flattening..  In November 2022, T wave abnormality is new.  Recent Labs: 06/21/2021: ALT 12; B Natriuretic Peptide 30.0; Magnesium 1.6; TSH 1.952 06/22/2021: Hemoglobin 13.2; Platelets 241 08/29/2021: BUN 13; Creatinine, Ser 0.83; Potassium 4.4; Sodium 146  Recent Lipid  Panel    Component Value Date/Time   CHOL 155 06/22/2021 0336   TRIG 80 06/22/2021 0336   HDL 75 06/22/2021 0336   CHOLHDL 2.1 06/22/2021 0336   VLDL 16 06/22/2021 0336   LDLCALC 64 06/22/2021 0336    Physical Exam:    VS:  BP 118/64   Pulse 78   Ht '4\' 11"'$  (1.499 m)   Wt 112 lb (50.8 kg)   SpO2 96%   BMI 22.62 kg/m     Wt Readings from Last 3 Encounters:  03/14/22 112 lb (50.8 kg)  09/25/21 113 lb (51.3 kg)  08/14/21 115 lb 3.2 oz (52.3 kg)     GEN: Slender. No acute distress HEENT: Normal NECK: No JVD. LYMPHATICS: No lymphadenopathy CARDIAC: No murmur. RRR no gallop, or edema. VASCULAR:  Normal Pulses. No bruits. RESPIRATORY:  Clear to auscultation without rales, wheezing or rhonchi  ABDOMEN: Soft, non-tender, non-distended, No pulsatile mass, MUSCULOSKELETAL: No deformity  SKIN: Warm and dry NEUROLOGIC:  Alert and oriented x 3 PSYCHIATRIC:  Normal affect   ASSESSMENT:    1. Coronary artery vasospasm ([place])   2. Coronary artery disease of native artery of native heart with stable angina pectoris (HCC)   3. Carpal tunnel syndrome, right upper limb   4. Dyslipidemia   5. Primary hypertension    PLAN:    In order of problems listed above:  Well-documented history of epicardial and likely microvascular coronary artery spasm responsive to sublingual nitroglycerin and maintained on amlodipine and long-acting nitrates.  No change in pattern. Fixed obstructive coronary disease with prior stenting, possibly at one-point she received stent for region of spasm.  Multiple heart catheterizations over the years have demonstrated patent coronaries Still having difficulty with her right hand after carpal tunnel release surgery.  Continue rosuvastatin. Continue therapy as listed for blood pressure.  Excellent control.   Overall education and awareness concerning primary/secondary risk prevention was discussed in detail: LDL less than 70, hemoglobin A1c less than 7, blood  pressure target less than 130/80 mmHg, >150 minutes of moderate aerobic activity  per week, avoidance of smoking, weight control (via diet and exercise), and continued surveillance/management of/for obstructive sleep apnea.     Medication Adjustments/Labs and Tests Ordered: Current medicines are reviewed at length with the patient today.  Concerns regarding medicines are outlined above.  No orders of the defined types were placed in this encounter.  No orders of the defined types were placed in this encounter.   Patient Instructions  Medication Instructions:  Your physician recommends that you continue on your current medications as directed. Please refer to the Current Medication list given to you today.  *If you need a refill on your cardiac medications before your next appointment, please call your pharmacy*  Lab Work: NONE  Testing/Procedures: NONE  Follow-Up: At [facility], you and your health needs are our priority.  As part of our continuing mission to provide you with exceptional heart care, we have created designated Provider Care Teams.  These Care Teams include your primary Cardiologist (physician) and Advanced Practice Providers (APPs -  Physician Assistants and Nurse Practitioners) who all work together to provide you with the care you need, when you need it.  Your next appointment:   9-12 month(s)  The format for your next appointment:   In Person  Provider:   [name], MD {   Important Information About Sugar         Signed, [name], MD  03/14/2022 3:16 PM    [place]

## 2022-03-17 ENCOUNTER — Ambulatory Visit: Payer: Medicare Other | Attending: Orthopedic Surgery | Admitting: Occupational Therapy

## 2022-03-17 DIAGNOSIS — M79631 Pain in right forearm: Secondary | ICD-10-CM | POA: Insufficient documentation

## 2022-03-17 DIAGNOSIS — M6281 Muscle weakness (generalized): Secondary | ICD-10-CM | POA: Diagnosis not present

## 2022-03-17 DIAGNOSIS — R278 Other lack of coordination: Secondary | ICD-10-CM | POA: Insufficient documentation

## 2022-03-17 DIAGNOSIS — M25531 Pain in right wrist: Secondary | ICD-10-CM | POA: Diagnosis not present

## 2022-03-17 DIAGNOSIS — R208 Other disturbances of skin sensation: Secondary | ICD-10-CM | POA: Insufficient documentation

## 2022-03-17 NOTE — Therapy (Signed)
OUTPATIENT OCCUPATIONAL THERAPY ORTHO EVALUATION  Patient Name: [name] MRN: [id number] DOB:05/26/1946, 76 y.o., female Today's Date: 03/18/2022  PCP: [name], MD REFERRING PROVIDER: Sherilyn Cooter, MD    OT End of Session - 03/17/22 1115     Visit Number 1    Number of Visits 17    Date for OT Re-Evaluation 05/16/22    Authorization Type [employer]    OT Start Time 1020    OT Stop Time 1107    OT Time Calculation (min) 47 min             Past Medical History:  Diagnosis Date   Anemia    Anginal pain ([place])    last cp was last year   Anxiety    Arthritis    Blood transfusion    Chest pain 02/04/2016   Coronary artery disease    Coronary artery spasm, wth continued episodes of chest pain.  09/26/2013   Coronary vasospasm (HCC)    Diabetes mellitus without complication (HCC)    type II   GERD (gastroesophageal reflux disease)    Headache    Hiatal hernia    Hypercholesteremia    Hypertension    NSTEMI (non-ST elevated myocardial infarction) ([place]) not sure   NSVT (nonsustained ventricular tachycardia) ([place]) 09/26/2013   Panic attack    Past Surgical History:  Procedure Laterality Date   ABDOMINAL HYSTERECTOMY     PARTIAL HYSTERECTOMY   ACDF N/A    C5-6 ACDF Dr. Sherwood Gambler   breast     right, tumor benign   BREAST EXCISIONAL BIOPSY Right    1961 (age 83) benign   CARDIAC CATHETERIZATION  2014   CARDIAC CATHETERIZATION  2012   CARDIAC CATHETERIZATION N/A 07/22/2016   Procedure: Left Heart Cath and Coronary Angiography;  Surgeon: Belva Crome, MD;  Location: [facility];  Service: Cardiovascular;  Laterality: N/A;   cardiac stent  2012   to RCA   CHOLECYSTECTOMY N/A 10/02/2017   Procedure: LAPAROSCOPIC CHOLECYSTECTOMY;  Surgeon: Ralene Ok, MD;  Location: [facility];  Service: General;  Laterality: N/A;   CORONARY ANGIOPLASTY     ESOPHAGEAL MANOMETRY N/A 10/29/2016   Procedure: ESOPHAGEAL MANOMETRY (EM);  Surgeon:  Garlan Fair, MD;  [facility];  Service: Endoscopy;  Laterality: N/A;   ESOPHAGOGASTRODUODENOSCOPY (EGD) WITH PROPOFOL N/A 10/28/2016   Procedure: ESOPHAGOGASTRODUODENOSCOPY (EGD) WITH PROPOFOL;  Surgeon: Garlan Fair, MD;  [facility];  Service: Endoscopy;  Laterality: N/A;   EXCISION OF SKIN TAG N/A 07/29/2017   Procedure: EXCISION OF PERIANAL  SKIN TAG;  Surgeon: Ralene Ok, MD;  Location: [facility];  Service: General;  Laterality: N/A;   KNEE SURGERY  left   left ear surgery     for bad cut   LEFT HEART CATHETERIZATION WITH CORONARY ANGIOGRAM N/A 02/22/2013   Procedure: LEFT HEART CATHETERIZATION WITH CORONARY ANGIOGRAM;  Surgeon: [name], MD;  Location: [facility];  Service: Cardiovascular;  Laterality: N/A;   SHOULDER SURGERY Bilateral    rotator cuff   Patient Active Problem List   Diagnosis Date Noted   Carpal tunnel syndrome, right upper limb 01/03/2022   Chest pain 06/21/2021   Variant angina ([place]) 01/24/2017   Precordial chest pain 01/23/2017   Headache 01/23/2017   S/P angioplasty with stent 01/23/2017   Coronary artery disease 11/24/2014   Syncope 10/22/2014   Hyperglycemia 04/20/2014   H/O hiatal hernia 04/19/2014   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  Coronary artery vasospasm ([place]) 09/26/2013   HTN (hypertension) 12/08/2011   GERD (gastroesophageal reflux disease) 12/08/2011   Depression 12/08/2011   Dyslipidemia 12/08/2011    ONSET DATE: 03/10/22 (referral date)  REFERRING DIAG: G56.01 (ICD-10-CM) - Carpal tunnel syndrome, right upper limb   THERAPY DIAG:  Pain in right wrist  Pain in right forearm  Muscle weakness (generalized)  Other disturbances of skin sensation  Other lack of coordination  Rationale for Evaluation and Treatment Rehabilitation  SUBJECTIVE:   SUBJECTIVE STATEMENT: When I go to pick something up, pain shoots through my hand and I nearly drop the item.  My hand and arm throb throughout the day and night.  Pt  reports pain all the time s/p surgery but would only have pain with certain movements prior to surgery.  Carpal Tunnel release surgery was on 01/24/22. Pt accompanied by: self  PERTINENT HISTORY: s/p CTR on 01/24/22.  H/o cervical radiculopathy, upcoming shoulder surgery end of August  PRECAUTIONS: Shoulder  WEIGHT BEARING RESTRICTIONS No  PAIN:  Are you having pain? Yes: NPRS scale: 8/10 Pain location: wrist and palm Pain description: throbbing, shooting pain Aggravating factors: certain movements Relieving factors: nothing  FALLS: Has patient fallen in last 6 months? No  LIVING ENVIRONMENT: Lives with: lives with their spouse Lives in: House/apartment Stairs: Yes: External: 1 steps; can reach both  PLOF: Independent and pain intermittently with certain tasks  PATIENT GOALS to get my dominant hand better  OBJECTIVE:   HAND DOMINANCE: Right  ADLs: Overall ADLs: unable to drive due to pain in hand Transfers/ambulation related to ADLs: Eating: painful when cutting items and sometime when bringing food to mouth.  Husband will open can, soda, and cut food for her Grooming: pain when brushing teeth UB Dressing: intermittent difficulty LB Dressing: tying shoes, pulling up pants are painful.  Husband will assist intermittently Toileting: pain when cleaning self after toileting Bathing: intemittent pain based on positioning   FUNCTIONAL OUTCOME MEASURES: Quick Dash: 70.45  UPPER EXTREMITY ROM     Active ROM Right eval Left eval  Shoulder flexion    Shoulder abduction    Shoulder adduction    Shoulder extension    Shoulder internal rotation    Shoulder external rotation    Elbow flexion    Elbow extension    Wrist flexion 89   Wrist extension 51   Wrist ulnar deviation 42   Wrist radial deviation 47   Wrist pronation    Wrist supination    (Blank rows = not tested)   HAND FUNCTION: Grip strength: Right: 8 lbs; Left: 29 lbs  COORDINATION: 9 Hole Peg test:  Right: 25.69 sec; Left: 28.59 sec Box and Blocks:  Right 42 blocks, Left 48 blocks  SENSATION: Hypersensitivity at scar and surrounding area  EDEMA: mild swelling in thenar eminence and moderate swelling in outer lower palm and up towards 5th digit  COGNITION: Overall cognitive status: Within functional limits for tasks assessed  OBSERVATIONS: guarding of RUE during mobility   TODAY'S TREATMENT:  Provided instruction and handouts on desensitization and tendon gliding.  Therapist providing demonstration and min cues for wrist positioning during tendon gliding.    PATIENT EDUCATION: Education details: Educated on role and purpose of OT as well as potential interventions and goals for therapy based on initial evaluation findings. Person educated: Patient Education method: Explanation and Handouts Education comprehension: verbalized understanding and needs further education   HOME EXERCISE PROGRAM: Access Code: [email or web address] URL: [email or web address] Date: 03/17/2022 Prepared by: [facility] -  Outpatient  Rehab - [facility]  Exercises - Wrist Tendon Gliding  - 2 x daily - 7 x weekly - 3 sets - 10 reps  GOALS: Goals reviewed with patient? No  SHORT TERM GOALS: Target date:  04/18/22     Pt will be independent in HEP with focus on pain management, strengthening, massage, and desensitization strategies. Baseline: Goal status: INITIAL  2.  Pt will verbalize understanding of adapted strategies and AE to maximize safety and Independence with ADLs/IADLs.   Baseline:  Goal status: INITIAL  3.  Pt will demonstrate and/or report improved sustained grip strength to engage in ADLs/IADLs without increased pain >2/10 on pain scale and without dropping items. Baseline:  Goal status: INITIAL  LONG TERM GOALS: Target date:  05/16/22     Pt will demonstrate improved wrist flexion/extension as evidenced by decreased report of pain during ADLs/IADLs. Baseline:  Goal status:  INITIAL  2.  Pt will demonstrate improved UE functional use for ADLs as evidenced by increasing box/blocks score by 4 blocks with RUE without report of increased pain > 2/10 on pain scale. Baseline: Right 42 blocks, Left 48 blocks Goal status: INITIAL  3.  Pt will demonstrate improved functional use of dominant RUE to engage in ADLs and IADLs as reported on QuickDASH with improved score of 50% or less Baseline: 70.45 Goal status: INITIAL  4.  Pt will demonstrate and/or report improved use of RUE during [facility] and handwriting tasks without increased pain of 2/10 on pain scale. Baseline:  Goal status: INITIAL   ASSESSMENT:  CLINICAL IMPRESSION: Patient is a 76 y.o. female who was seen today for occupational therapy evaluation for weakness and pain s/p carpal tunnel release.  Pt reports increased pain and throbbing in hand and up forearm, with difficulty with sleep. Pt will benefit from skilled occupational therapy services to address strength and coordination, ROM, pain management, scar mobilization, altered sensation, GM/FM control, safety awareness, introduction of compensatory strategies/AE prn, and implementation of an HEP to improve participation and safety during ADLs and IADLs and sleep.   PERFORMANCE DEFICITS in functional skills including ADLs, IADLs, coordination, dexterity, edema, ROM, strength, pain, flexibility, FMC, GMC, body mechanics, decreased knowledge of precautions, decreased knowledge of use of DME, wound, skin integrity, and UE functional use.  IMPAIRMENTS are limiting patient from ADLs, IADLs, and rest and sleep.   COMORBIDITIES may have co-morbidities  that affects occupational performance. Patient will benefit from skilled OT to address above impairments and improve overall function.  MODIFICATION OR ASSISTANCE TO COMPLETE EVALUATION: Min-Moderate modification of tasks or assist with assess necessary to complete an evaluation.  OT OCCUPATIONAL PROFILE AND HISTORY:  Detailed assessment: Review of records and additional review of physical, cognitive, psychosocial history related to current functional performance.  CLINICAL DECISION MAKING: LOW - limited treatment options, no task modification necessary  REHAB POTENTIAL: Good  EVALUATION COMPLEXITY: Low      PLAN: OT FREQUENCY: 2x/week  OT DURATION: 8 weeks  PLANNED INTERVENTIONS: self care/ADL training, therapeutic exercise, therapeutic activity, manual therapy, scar mobilization, passive range of motion, splinting, ultrasound, paraffin, compression bandaging, moist heat, cryotherapy, patient/family education, and DME and/or AE instructions  RECOMMENDED OTHER SERVICES: N/A  CONSULTED AND AGREED WITH PLAN OF CARE: Patient  PLAN FOR NEXT SESSION: review desensitization techniques, initiate and progress scar mobilization as tolerated.  Review tendon gliding and begin strengthening exercises (putty)   [name], OTR/L 03/18/2022, 8:26 AM

## 2022-03-19 ENCOUNTER — Ambulatory Visit: Payer: Medicare Other | Admitting: Occupational Therapy

## 2022-03-19 DIAGNOSIS — M79631 Pain in right forearm: Secondary | ICD-10-CM | POA: Diagnosis not present

## 2022-03-19 DIAGNOSIS — M6281 Muscle weakness (generalized): Secondary | ICD-10-CM

## 2022-03-19 DIAGNOSIS — R208 Other disturbances of skin sensation: Secondary | ICD-10-CM

## 2022-03-19 DIAGNOSIS — R278 Other lack of coordination: Secondary | ICD-10-CM | POA: Diagnosis not present

## 2022-03-19 DIAGNOSIS — M25531 Pain in right wrist: Secondary | ICD-10-CM | POA: Diagnosis not present

## 2022-03-19 NOTE — Therapy (Signed)
OUTPATIENT OCCUPATIONAL THERAPY TREATMENT NOTE  Patient Name: [name] MRN: [id number] DOB:10-Jul-1946, 76 y.o., female Today's Date: 03/19/2022  PCP: [name], MD REFERRING PROVIDER: Sherilyn Cooter, MD    OT End of Session - 03/19/22 1515     Visit Number 2    Number of Visits 17    Date for OT Re-Evaluation 05/16/22    Authorization Type [employer] Medicare    OT Start Time 1446    OT Stop Time 1525    OT Time Calculation (min) 39 min    Activity Tolerance Patient tolerated treatment well    Behavior During Therapy [facility] for tasks assessed/performed            Past Medical History:  Diagnosis Date   Anemia    Anginal pain ([place])    last cp was last year   Anxiety    Arthritis    Blood transfusion    Chest pain 02/04/2016   Coronary artery disease    Coronary artery spasm, wth continued episodes of chest pain.  09/26/2013   Coronary vasospasm (HCC)    Diabetes mellitus without complication (HCC)    type II   GERD (gastroesophageal reflux disease)    Headache    Hiatal hernia    Hypercholesteremia    Hypertension    NSTEMI (non-ST elevated myocardial infarction) ([place]) not sure   NSVT (nonsustained ventricular tachycardia) ([place]) 09/26/2013   Panic attack    Past Surgical History:  Procedure Laterality Date   ABDOMINAL HYSTERECTOMY     PARTIAL HYSTERECTOMY   ACDF N/A    C5-6 ACDF Dr. Sherwood Gambler   breast     right, tumor benign   BREAST EXCISIONAL BIOPSY Right    1961 (age 69) benign   CARDIAC CATHETERIZATION  2014   CARDIAC CATHETERIZATION  2012   CARDIAC CATHETERIZATION N/A 07/22/2016   Procedure: Left Heart Cath and Coronary Angiography;  Surgeon: Belva Crome, MD;  Location: [place] [facility];  Service: Cardiovascular;  Laterality: N/A;   cardiac stent  2012   to RCA   CHOLECYSTECTOMY N/A 10/02/2017   Procedure: LAPAROSCOPIC CHOLECYSTECTOMY;  Surgeon: Ralene Ok, MD;  Location: [place];  Service: General;  Laterality: N/A;    CORONARY ANGIOPLASTY     ESOPHAGEAL MANOMETRY N/A 10/29/2016   Procedure: ESOPHAGEAL MANOMETRY (EM);  Surgeon: Garlan Fair, MD;  [facility];  Service: Endoscopy;  Laterality: N/A;   ESOPHAGOGASTRODUODENOSCOPY (EGD) WITH PROPOFOL N/A 10/28/2016   Procedure: ESOPHAGOGASTRODUODENOSCOPY (EGD) WITH PROPOFOL;  Surgeon: Garlan Fair, MD;  [facility];  Service: Endoscopy;  Laterality: N/A;   EXCISION OF SKIN TAG N/A 07/29/2017   Procedure: EXCISION OF PERIANAL  SKIN TAG;  Surgeon: Ralene Ok, MD;  Location: [place];  Service: General;  Laterality: N/A;   KNEE SURGERY  left   left ear surgery     for bad cut   LEFT HEART CATHETERIZATION WITH CORONARY ANGIOGRAM N/A 02/22/2013   Procedure: LEFT HEART CATHETERIZATION WITH CORONARY ANGIOGRAM;  Surgeon: [name], MD;  Location: [facility];  Service: Cardiovascular;  Laterality: N/A;   SHOULDER SURGERY Bilateral    rotator cuff   Patient Active Problem List   Diagnosis Date Noted   Carpal tunnel syndrome, right upper limb 01/03/2022   Chest pain 06/21/2021   Variant angina ([place]) 01/24/2017   Precordial chest pain 01/23/2017   Headache 01/23/2017   S/P angioplasty with stent 01/23/2017   Coronary artery disease 11/24/2014   Syncope  10/22/2014   Hyperglycemia 04/20/2014   H/O hiatal hernia 04/19/2014   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    Coronary artery vasospasm ([place]) 09/26/2013   HTN (hypertension) 12/08/2011   GERD (gastroesophageal reflux disease) 12/08/2011   Depression 12/08/2011   Dyslipidemia 12/08/2011    ONSET DATE: 03/10/22 (referral date)  REFERRING DIAG: G56.01 (ICD-10-CM) - Carpal tunnel syndrome, right upper limb   THERAPY DIAG:  Pain in right wrist  Pain in right forearm  Muscle weakness (generalized)  Other disturbances of skin sensation  Rationale for Evaluation and Treatment Rehabilitation  SUBJECTIVE:   SUBJECTIVE STATEMENT: "How many times a day should I do this?" Pt accompanied by:  self  PERTINENT HISTORY: s/p CTR on 01/24/22; h/o cervical radiculopathy, upcoming reverse Rt TSA on 04/07/22  PRECAUTIONS: Shoulder  PAIN: Are you having pain? Yes: NPRS scale: 4/10 Pain location: wrist and palm Pain description: soreness Aggravating factors: certain movements Relieving factors: sometimes extra-strength Tylenol helps, sometimes it does not  HAND DOMINANCE: Right  PLOF: Independent and pain intermittently with certain tasks  PATIENT GOALS to get my dominant hand better   OBJECTIVE:   TODAY'S TREATMENT:  Tendon Gliding Tendon gliding (open hand, hook fist, full fist, MPJ flexion, straight/flat fist) x15 for R hand; OT providing modeling and verbal cues for understanding, gradation, and pacing   Tendon Excursion Exercises AROM of finger flex/ext w/ blocking of other digits; 2x10 each for digits 2-5  AROM of PIPJ flex/ext w/ blocking of MCPJ; 2x10 each for digits 2-5  AROM of R thumb flexion completed 2x10 w/out difficulty  Scar Massage Scar massage completed around palmar aspect of wrist and hand in order to facilitate mobility within tissue structures and decrease stiffness/adhesions. Pt tolerated intervention w/out discomfort w/ OT providing corresponding education for incorporation of massage at home; pt verbalized understanding     PATIENT EDUCATION: Ongoing condition-specific education related to therapeutic interventions completed this session Person educated: Patient Education method: Explanation and Handouts Education comprehension: verbalized understanding and needs further education   HOME EXERCISE PROGRAM: Access Code: [email or web address] - Wrist Tendon Gliding  - 2 x daily - 7 x weekly - 3 sets - 10 reps  GOALS: Goals reviewed with patient? No  SHORT TERM GOALS: Target date:  04/18/22     Pt will be independent in HEP with focus on pain management, strengthening, massage, and desensitization strategies. Baseline: to be administered Goal status:  INITIAL  Pt will verbalize understanding of adapted strategies and AE to maximize safety and Independence with ADLs/IADLs.   Baseline: decreased knowledge of compensatory strategies Goal status: INITIAL  Pt will demonstrate and/or report improved sustained grip strength to engage in ADLs/IADLs without increased pain >2/10 on pain scale and without dropping items. Baseline: Right: 8 lbs; Left: 29 lbs Goal status: INITIAL  LONG TERM GOALS: Target date:  05/16/22     Pt will demonstrate improved wrist flexion/extension as evidenced by decreased report of pain during ADLs/IADLs. Baseline: 51 deg Goal status: INITIAL  Pt will demonstrate improved UE functional use for ADLs as evidenced by increasing box/blocks score by 4 blocks with RUE without report of increased pain > 2/10 on pain scale. Baseline: Right 42 blocks, Left 48 blocks Goal status: INITIAL  Pt will demonstrate improved functional use of dominant RUE to engage in ADLs and IADLs as reported on QuickDASH with improved score of 50% or less Baseline: 70.45 Goal status: INITIAL  Pt will demonstrate and/or report improved use of RUE during [facility] and handwriting tasks without increased pain of  2/10 on pain scale. Baseline: 8/10 Goal status: INITIAL   ASSESSMENT:  CLINICAL IMPRESSION: Pt arrives for first treatment session following initial evaluation on 03/17/22. OT reviewed goals w/ pt agreeable to POC at this time. OT then addressed tendon gliding exercises and AROM targeting tendon excursion, providing relevant education and prompting for alignment/positioning prn. Scar massage also attempted this session to facilitate soft tissue mobility and decrease pain w/ pt able to tolerate w/out difficulty. Will continue to progress protocol as able, focusing on maximizing functional use of RUE.  PERFORMANCE DEFICITS in functional skills including ADLs, IADLs, coordination, dexterity, edema, ROM, strength, pain, flexibility, FMC, GMC, body  mechanics, decreased knowledge of precautions, decreased knowledge of use of DME, wound, skin integrity, and UE functional use.  IMPAIRMENTS are limiting patient from ADLs, IADLs, and rest and sleep.   COMORBIDITIES may have co-morbidities  that affects occupational performance. Patient will benefit from skilled OT to address above impairments and improve overall function.   PLAN: OT FREQUENCY: 2x/week  OT DURATION: 8 weeks  PLANNED INTERVENTIONS: self care/ADL training, therapeutic exercise, therapeutic activity, manual therapy, scar mobilization, passive range of motion, splinting, ultrasound, paraffin, compression bandaging, moist heat, cryotherapy, patient/family education, and DME and/or AE instructions  RECOMMENDED OTHER SERVICES: N/A  CONSULTED AND AGREED WITH PLAN OF CARE: Patient  PLAN FOR NEXT SESSION: Continue w/ scar massage and tendon gliding/excursion exercises; begin light hand strengthening as able per tolerance (putty)   Kathrine Cords, OTR/L 03/19/2022, 4:15 PM

## 2022-03-24 ENCOUNTER — Ambulatory Visit: Payer: Medicare Other | Admitting: Occupational Therapy

## 2022-03-24 ENCOUNTER — Ambulatory Visit: Payer: Self-pay

## 2022-03-24 DIAGNOSIS — M25531 Pain in right wrist: Secondary | ICD-10-CM | POA: Diagnosis not present

## 2022-03-24 DIAGNOSIS — R278 Other lack of coordination: Secondary | ICD-10-CM | POA: Diagnosis not present

## 2022-03-24 DIAGNOSIS — M6281 Muscle weakness (generalized): Secondary | ICD-10-CM

## 2022-03-24 DIAGNOSIS — R208 Other disturbances of skin sensation: Secondary | ICD-10-CM

## 2022-03-24 DIAGNOSIS — M79631 Pain in right forearm: Secondary | ICD-10-CM | POA: Diagnosis not present

## 2022-03-24 NOTE — Patient Instructions (Signed)
Visit Information Thank you for allowing the Care Management team to participate in your care. It was great speaking with you today!  Following are the goals we discussed today:   Goals Addressed             This Visit's Progress    Health Maintenance       Care Coordination Interventions: Reviewed plan for disease management. Reports adhering to plan and attending medical appointments as scheduled. Reports BP is controlled. DM controlled with A1C of 6.6%.  Reviewed medications. Reports taking as prescribed. Denies concerns r/t medication management or prescription cost. AWV up to date. Completed on 03/03/22. Pending right shoulder arthroplasty on 04/07/22. Will complete care coordination follow up after procedure.         Our next appointment is by telephone on April 11, 2022 at 3 pm. Please call the care guide team at [phone] if you need to cancel or reschedule your appointment.  Please do not hesitate to call if you require outreach prior to our scheduled call.  [name] verbalized understanding of information discussed during the telephonic outreach. Declined need for mailed instructions or resources.    Will follow up next month.  [place] [facility] [phone]

## 2022-03-24 NOTE — Patient Outreach (Signed)
  Care Coordination   Initial Visit Note   03/24/2022 Name: [name] MRN: [id number] DOB: 09-06-1945  [name] is a 77 y.o. year old female who sees Jillian Low, MD for primary care. I spoke with  [name] by phone today  What matters to the patients health and wellness today?  Health Maintenance    Goals Addressed             This Visit's Progress    Health Maintenance       Care Coordination Interventions: Reviewed plan for disease management. Reports adhering to plan and attending medical appointments as scheduled. Reports BP is controlled. DM controlled with A1C of 6.6%.  Reviewed medications. Reports taking as prescribed. Denies concerns r/t medication management or prescription cost. AWV up to date. Completed on 03/03/22. Pending right shoulder arthroplasty on 04/07/22. Will complete care coordination follow up after procedure.         SDOH assessments and interventions completed:  Yes  SDOH Interventions Today    Flowsheet Row Most Recent Value  SDOH Interventions   Food Insecurity Interventions Intervention Not Indicated  Transportation Interventions Intervention Not Indicated        Care Coordination Interventions Activated:  Yes  Care Coordination Interventions:  Yes, provided   Follow up plan: Follow up call scheduled for April 11, 2022.    Encounter Outcome:  Pt. Visit Completed   [place] [facility] [phone]

## 2022-03-24 NOTE — Therapy (Signed)
OUTPATIENT OCCUPATIONAL THERAPY TREATMENT NOTE  Patient Name: [name] MRN: [id number] DOB:1945-12-21, 76 y.o., female Today's Date: 03/24/2022  PCP: [name], MD REFERRING PROVIDER: Sherilyn Cooter, MD    OT End of Session - 03/24/22 1153     Visit Number 3    Number of Visits 17    Date for OT Re-Evaluation 05/16/22    Authorization Type [employer] Medicare    OT Start Time 1150    OT Stop Time 1230    OT Time Calculation (min) 40 min    Activity Tolerance Patient tolerated treatment well    Behavior During Therapy WFL for tasks assessed/performed             Past Medical History:  Diagnosis Date   Anemia    Anginal pain ([place])    last cp was last year   Anxiety    Arthritis    Blood transfusion    Chest pain 02/04/2016   Coronary artery disease    Coronary artery spasm, wth continued episodes of chest pain.  09/26/2013   Coronary vasospasm (HCC)    Diabetes mellitus without complication (HCC)    type II   GERD (gastroesophageal reflux disease)    Headache    Hiatal hernia    Hypercholesteremia    Hypertension    NSTEMI (non-ST elevated myocardial infarction) ([place]) not sure   NSVT (nonsustained ventricular tachycardia) ([place]) 09/26/2013   Panic attack    Past Surgical History:  Procedure Laterality Date   ABDOMINAL HYSTERECTOMY     PARTIAL HYSTERECTOMY   ACDF N/A    C5-6 ACDF Dr. Sherwood Gambler   breast     right, tumor benign   BREAST EXCISIONAL BIOPSY Right    1961 (age 53) benign   CARDIAC CATHETERIZATION  2014   CARDIAC CATHETERIZATION  2012   CARDIAC CATHETERIZATION N/A 07/22/2016   Procedure: Left Heart Cath and Coronary Angiography;  Surgeon: Belva Crome, MD;  Location: [place] [facility];  Service: Cardiovascular;  Laterality: N/A;   cardiac stent  2012   to RCA   CHOLECYSTECTOMY N/A 10/02/2017   Procedure: LAPAROSCOPIC CHOLECYSTECTOMY;  Surgeon: Ralene Ok, MD;  Location: [place];  Service: General;  Laterality: N/A;    CORONARY ANGIOPLASTY     ESOPHAGEAL MANOMETRY N/A 10/29/2016   Procedure: ESOPHAGEAL MANOMETRY (EM);  Surgeon: Garlan Fair, MD;  [facility];  Service: Endoscopy;  Laterality: N/A;   ESOPHAGOGASTRODUODENOSCOPY (EGD) WITH PROPOFOL N/A 10/28/2016   Procedure: ESOPHAGOGASTRODUODENOSCOPY (EGD) WITH PROPOFOL;  Surgeon: Garlan Fair, MD;  [facility];  Service: Endoscopy;  Laterality: N/A;   EXCISION OF SKIN TAG N/A 07/29/2017   Procedure: EXCISION OF PERIANAL  SKIN TAG;  Surgeon: Ralene Ok, MD;  Location: [place];  Service: General;  Laterality: N/A;   KNEE SURGERY  left   left ear surgery     for bad cut   LEFT HEART CATHETERIZATION WITH CORONARY ANGIOGRAM N/A 02/22/2013   Procedure: LEFT HEART CATHETERIZATION WITH CORONARY ANGIOGRAM;  Surgeon: [name], MD;  Location: [facility] CATH LAB;  Service: Cardiovascular;  Laterality: N/A;   SHOULDER SURGERY Bilateral    rotator cuff   Patient Active Problem List   Diagnosis Date Noted   Carpal tunnel syndrome, right upper limb 01/03/2022   Chest pain 06/21/2021   Variant angina ([place]) 01/24/2017   Precordial chest pain 01/23/2017   Headache 01/23/2017   S/P angioplasty with stent 01/23/2017   Coronary artery disease 11/24/2014  Syncope 10/22/2014   Hyperglycemia 04/20/2014   H/O hiatal hernia 04/19/2014   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    Coronary artery vasospasm ([place]) 09/26/2013   HTN (hypertension) 12/08/2011   GERD (gastroesophageal reflux disease) 12/08/2011   Depression 12/08/2011   Dyslipidemia 12/08/2011    ONSET DATE: 03/10/22 (referral date)  REFERRING DIAG: G56.01 (ICD-10-CM) - Carpal tunnel syndrome, right upper limb   THERAPY DIAG:  Pain in right wrist  Pain in right forearm  Muscle weakness (generalized)  Other disturbances of skin sensation  Other lack of coordination  Rationale for Evaluation and Treatment Rehabilitation  SUBJECTIVE:   SUBJECTIVE STATEMENT: Pt reports continued  hypersensitivity in incision site and along palmar aspect of hand and wrist. Pt accompanied by: self  PERTINENT HISTORY: s/p CTR on 01/24/22; h/o cervical radiculopathy, upcoming reverse Rt TSA on 04/07/22  PRECAUTIONS: Shoulder  PAIN: Are you having pain? Yes: NPRS scale: 6/10 Pain location: wrist and palm Pain description: aching Aggravating factors: certain movements Relieving factors: sometimes extra-strength Tylenol helps, sometimes it does not  HAND DOMINANCE: Right  PLOF: Independent and pain intermittently with certain tasks  PATIENT GOALS to get my dominant hand better   OBJECTIVE:   TODAY'S TREATMENT:   Tendon Gliding Tendon gliding (open hand, hook fist, full fist, MPJ flexion, straight/flat fist) x10 for R hand; OT providing modeling and verbal cues for understanding, gradation, and pacing. Pt demonstrating improved technique with repetition  Tendon Excursion Exercises Therapist reviewed previously administered exercises with pt demonstrating good technique and understanding of exercise.  AROM of finger flex/ext w/ blocking of other digits; 1x10 each for digits 2-5  AROM of PIPJ flex/ext w/ blocking of MCPJ; 1x10 each for digits 2-5  AROM of R thumb flexion completed 1x10 w/out difficulty  Wrist flexion/extension AROM of wrist flexion/extension initially on neutral, however with increased pain therefore transitioned to completing with palm down 1x10.  No pain with palm down or palm up position.    PROM of wrist flexion extension 1x10 without difficulty and reports of "strain" but no pain.  Scar Massage/ Desensitization Scar massage completed around palmar aspect of wrist and hand in order to facilitate mobility within tissue structures and decrease stiffness/adhesions. Pt tolerated intervention without discomfort with OT providing corresponding education for incorporation of massage at home; pt verbalized understanding   Reviewed desensitization recommendations and  strategies.  Encouraged increased frequency to 4-6x per day as pt still reporting hypersensitivity.  Discussed various textures and increased pressure.    PATIENT EDUCATION: Ongoing condition-specific education related to therapeutic interventions completed this session Person educated: Patient Education method: Explanation and Handouts Education comprehension: verbalized understanding and needs further education   HOME EXERCISE PROGRAM: Access Code: [email or web address] URL: [email or web address] Date: 03/24/2022 Prepared by: [facility]  Exercises - Wrist Tendon Gliding  - 3-4 x daily - 2 sets - 10 reps - Wrist Flexion Extension AROM - Palms Down  - 3-4 x daily - 2 sets - 10 reps - Seated Wrist Flexion Stretch  - 3-4 x daily - 2 sets - 10 reps  GOALS: Goals reviewed with patient? Yes  SHORT TERM GOALS: Target date:  04/18/22     Pt will be independent in HEP with focus on pain management, strengthening, massage, and desensitization strategies. Baseline: to be administered Goal status: IN PROGRESS  Pt will verbalize understanding of adapted strategies and AE to maximize safety and Independence with ADLs/IADLs.   Baseline: decreased knowledge of compensatory strategies Goal status: IN PROGRESS  Pt will demonstrate and/or report improved sustained grip strength to engage in ADLs/IADLs without increased pain >2/10 on pain scale and without dropping items. Baseline: Right: 8 lbs; Left: 29 lbs Goal status: IN PROGRESS  LONG TERM GOALS: Target date:  05/16/22     Pt will demonstrate improved wrist flexion/extension as evidenced by decreased report of pain during ADLs/IADLs. Baseline: 51 deg Goal status: IN PROGRESS  Pt will demonstrate improved UE functional use for ADLs as evidenced by increasing box/blocks score by 4 blocks with RUE without report of increased pain > 2/10 on pain scale. Baseline: Right 42 blocks, Left 48 blocks Goal  status: IN PROGRESS  Pt will demonstrate improved functional use of dominant RUE to engage in ADLs and IADLs as reported on QuickDASH with improved score of 50% or less Baseline: 70.45 Goal status: IN PROGRESS  Pt will demonstrate and/or report improved use of RUE during [facility] and handwriting tasks without increased pain of 2/10 on pain scale. Baseline: 8/10 Goal status: IN PROGRESS   ASSESSMENT:  CLINICAL IMPRESSION: Treatment session with focus on progression of wrist and digit mobility, scar mobilization, and desensitization.  OT reviewed tendon gliding exercises, AROM targeting tendon excursion, and instructed on wrist flexion/extension AROM and PROM. Pt demonstrating increased tolerance to wrist flexion in palm up/palm down position vs neutral position, even tolerating PROM. Educated on frequency of exercises, scar mobilization, and increased frequency of desensitization due to hypersensitivity. Will continue to progress protocol as able, focusing on maximizing functional use of RUE.  PERFORMANCE DEFICITS in functional skills including ADLs, IADLs, coordination, dexterity, edema, ROM, strength, pain, flexibility, FMC, GMC, body mechanics, decreased knowledge of precautions, decreased knowledge of use of DME, wound, skin integrity, and UE functional use.  IMPAIRMENTS are limiting patient from ADLs, IADLs, and rest and sleep.   COMORBIDITIES may have co-morbidities  that affects occupational performance. Patient will benefit from skilled OT to address above impairments and improve overall function.   PLAN: OT FREQUENCY: 2x/week  OT DURATION: 8 weeks  PLANNED INTERVENTIONS: self care/ADL training, therapeutic exercise, therapeutic activity, manual therapy, scar mobilization, passive range of motion, splinting, ultrasound, paraffin, compression bandaging, moist heat, cryotherapy, patient/family education, and DME and/or AE instructions  RECOMMENDED OTHER SERVICES: N/A  CONSULTED AND  AGREED WITH PLAN OF CARE: Patient  PLAN FOR NEXT SESSION: Continue w/ scar massage and tendon gliding/excursion exercises; begin light hand strengthening as able per tolerance (putty)   Ninoska Goswick, OTR/L 03/24/2022, 4:15 PM

## 2022-03-26 ENCOUNTER — Ambulatory Visit: Payer: Medicare Other | Admitting: Interventional Cardiology

## 2022-03-26 ENCOUNTER — Ambulatory Visit: Payer: Medicare Other | Admitting: Occupational Therapy

## 2022-03-26 DIAGNOSIS — M6281 Muscle weakness (generalized): Secondary | ICD-10-CM | POA: Diagnosis not present

## 2022-03-26 DIAGNOSIS — R208 Other disturbances of skin sensation: Secondary | ICD-10-CM | POA: Diagnosis not present

## 2022-03-26 DIAGNOSIS — M25531 Pain in right wrist: Secondary | ICD-10-CM

## 2022-03-26 DIAGNOSIS — R278 Other lack of coordination: Secondary | ICD-10-CM | POA: Diagnosis not present

## 2022-03-26 DIAGNOSIS — M79631 Pain in right forearm: Secondary | ICD-10-CM | POA: Diagnosis not present

## 2022-03-26 NOTE — Therapy (Signed)
OUTPATIENT OCCUPATIONAL THERAPY TREATMENT NOTE  Patient Name: [name] MRN: [id number] DOB:[birth year], 76 y.o., female Today's Date: 03/26/2022  PCP: [name], MD REFERRING PROVIDER: Sherilyn Cooter, MD    OT End of Session - 03/26/22 1307     Visit Number 4    Number of Visits 17    Date for OT Re-Evaluation 05/16/22    Authorization Type [employer]    OT Start Time 1105    OT Stop Time 1148    OT Time Calculation (min) 43 min    Activity Tolerance Patient tolerated treatment well    Behavior During Therapy WFL for tasks assessed/performed              Past Medical History:  Diagnosis Date   Anemia    Anginal pain ([place])    last cp was last year   Anxiety    Arthritis    Blood transfusion    Chest pain 02/04/2016   Coronary artery disease    Coronary artery spasm, wth continued episodes of chest pain.  09/26/2013   Coronary vasospasm (H[facility])    Diabetes mellitus without complication (HCC)    type II   GERD (gastroesophageal reflux disease)    Headache    Hiatal hernia    Hypercholesteremia    Hypertension    NSTEMI (non-ST elevated myocardial infarction) ([place]) not sure   NSVT (nonsustained ventricular tachycardia) ([place]) 09/26/2013   Panic attack    Past Surgical History:  Procedure Laterality Date   ABDOMINAL HYSTERECTOMY     PARTIAL HYSTERECTOMY   ACDF N/A    C5-6 ACDF Dr. Sherwood Gambler   breast     right, tumor benign   BREAST EXCISIONAL BIOPSY Right    1961 (age 17) benign   CARDIAC CATHETERIZATION  2014   CARDIAC CATHETERIZATION  2012   CARDIAC CATHETERIZATION N/A 07/22/2016   Procedure: Left Heart Cath and Coronary Angiography;  Surgeon: Belva Crome, MD;  Location: [facility];  Service: Cardiovascular;  Laterality: N/A;   cardiac stent  2012   to RCA   CHOLECYSTECTOMY N/A 10/02/2017   Procedure: LAPAROSCOPIC CHOLECYSTECTOMY;  Surgeon: Ralene Ok, MD;  Location: [facility];  Service: General;  Laterality: N/A;    CORONARY ANGIOPLASTY     ESOPHAGEAL MANOMETRY N/A 10/29/2016   Procedure: ESOPHAGEAL MANOMETRY (EM);  Surgeon: Garlan Fair, MD;  Location: [facility];  Service: Endoscopy;  Laterality: N/A;   ESOPHAGOGASTRODUODENOSCOPY (EGD) WITH PROPOFOL N/A 10/28/2016   Procedure: ESOPHAGOGASTRODUODENOSCOPY (EGD) WITH PROPOFOL;  Surgeon: Garlan Fair, MD;  Location: [facility];  Service: Endoscopy;  Laterality: N/A;   EXCISION OF SKIN TAG N/A 07/29/2017   Procedure: EXCISION OF PERIANAL  SKIN TAG;  Surgeon: Ralene Ok, MD;  Location: [facility];  Service: General;  Laterality: N/A;   KNEE SURGERY  left   left ear surgery     for bad cut   LEFT HEART CATHETERIZATION WITH CORONARY ANGIOGRAM N/A 02/22/2013   Procedure: LEFT HEART CATHETERIZATION WITH CORONARY ANGIOGRAM;  Surgeon: [name], MD;  Location: [facility];  Service: Cardiovascular;  Laterality: N/A;   SHOULDER SURGERY Bilateral    rotator cuff   Patient Active Problem List   Diagnosis Date Noted   Carpal tunnel syndrome, right upper limb 01/03/2022   Chest pain 06/21/2021   Variant angina ([place]) 01/24/2017   Precordial chest pain 01/23/2017   Headache 01/23/2017   S/P angioplasty with stent 01/23/2017   Coronary artery disease 11/24/2014  Syncope 10/22/2014   Hyperglycemia 04/20/2014   H/O hiatal hernia 04/19/2014   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    Coronary artery vasospasm ([place]) 09/26/2013   HTN (hypertension) 12/08/2011   GERD (gastroesophageal reflux disease) 12/08/2011   Depression 12/08/2011   Dyslipidemia 12/08/2011    ONSET DATE: 03/10/22 (referral date)  REFERRING DIAG: G56.01 (ICD-10-CM) - Carpal tunnel syndrome, right upper limb   THERAPY DIAG:  Pain in right wrist  Pain in right forearm  Muscle weakness (generalized)  Other disturbances of skin sensation  Other lack of coordination  Rationale for Evaluation and Treatment Rehabilitation  SUBJECTIVE:   SUBJECTIVE STATEMENT: Pt reports not feeling good  today, reporting pins and needles shooting through palmar aspect and up into forearm. Pt accompanied by: self  PERTINENT HISTORY: s/p CTR on 01/24/22; h/o cervical radiculopathy, upcoming reverse Rt TSA on 04/07/22  PRECAUTIONS: Shoulder  PAIN: Are you having pain? Yes: NPRS scale: 8/10 Pain location: wrist and palm Pain description: aching, shooting Aggravating factors: certain movements Relieving factors: sometimes extra-strength Tylenol helps, sometimes it does not  HAND DOMINANCE: Right  PLOF: Independent and pain intermittently with certain tasks  PATIENT GOALS to get my dominant hand better   OBJECTIVE:   TODAY'S TREATMENT:   Tendon Gliding Tendon gliding (open hand, hook fist, full fist, MPJ flexion, straight/flat fist) x10 for R hand; OT providing modeling and verbal cues for understanding, gradation, and pacing.  Pt continues to demonstrate intermittent guarding with movements due to reports of pain along incision site and base of thumb.  Tendon Excursion Exercises Therapist reviewed previously administered exercises with pt demonstrating good technique and understanding of exercise.  AROM of finger flex/ext w/ blocking of other digits; 1x10 each for digits 2-5  AROM of PIPJ flex/ext w/ blocking of MCPJ; 1x10 each for digits 2-5  AROM of R thumb flexion completed 1x10 w/out difficulty.  Pt reports pain in base of thumb and along incision site down into wrist and forearm with movements.  Reiterated importance of movement and soft tissue massage and scar massage to decrease pain.  Scar Mobilization/ Ice Massage Scar mobilization completed around palmar aspect of wrist and hand in order to facilitate mobility within tissue structures and decrease stiffness/adhesions. Pt tolerated intervention without discomfort with OT providing corresponding education for incorporation of massage at home; pt verbalized understanding.  Performed ice massage along incision site and  surrounding areas while educating on use of ice and elevation for reduction of edema and pain.  Pt demonstrating decreased tolerance to ice massage, reporting still using ice pack 2x/day if experiencing pain.     PATIENT EDUCATION: Ongoing condition-specific education related to therapeutic interventions completed this session Person educated: Patient Education method: Explanation and Handouts Education comprehension: verbalized understanding and needs further education   HOME EXERCISE PROGRAM: Access Code: [email or web address] URL: [email or web address] Date: 03/24/2022 Prepared by: [facility]  Exercises - Wrist Tendon Gliding  - 3-4 x daily - 2 sets - 10 reps - Wrist Flexion Extension AROM - Palms Down  - 3-4 x daily - 2 sets - 10 reps - Seated Wrist Flexion Stretch  - 3-4 x daily - 2 sets - 10 reps  GOALS: Goals reviewed with patient? Yes  SHORT TERM GOALS: Target date:  04/18/22     Pt will be independent in HEP with focus on pain management, strengthening, massage, and desensitization strategies. Baseline: to be administered Goal status: IN PROGRESS  Pt will verbalize understanding of adapted strategies and AE  to maximize safety and Independence with ADLs/IADLs.   Baseline: decreased knowledge of compensatory strategies Goal status: IN PROGRESS  Pt will demonstrate and/or report improved sustained grip strength to engage in ADLs/IADLs without increased pain >2/10 on pain scale and without dropping items. Baseline: Right: 8 lbs; Left: 29 lbs Goal status: IN PROGRESS  LONG TERM GOALS: Target date:  05/16/22     Pt will demonstrate improved wrist flexion/extension as evidenced by decreased report of pain during ADLs/IADLs. Baseline: 51 deg Goal status: IN PROGRESS  Pt will demonstrate improved UE functional use for ADLs as evidenced by increasing box/blocks score by 4 blocks with RUE without report of increased pain > 2/10 on pain  scale. Baseline: Right 42 blocks, Left 48 blocks Goal status: IN PROGRESS  Pt will demonstrate improved functional use of dominant RUE to engage in ADLs and IADLs as reported on QuickDASH with improved score of 50% or less Baseline: 70.45 Goal status: IN PROGRESS  Pt will demonstrate and/or report improved use of RUE during [facility] and handwriting tasks without increased pain of 2/10 on pain scale. Baseline: 8/10 Goal status: IN PROGRESS   ASSESSMENT:  CLINICAL IMPRESSION: Treatment session with focus on progression of wrist and digit mobility, scar mobilization, and desensitization.  OT reviewed tendon gliding exercises, AROM targeting tendon excursion.  Pt reports pain with wrist mobility and even tendon gliding exercises - therapist encouraged pt to focus on massage and scar mobilization and minimize wrist movements at this time. Discussed possibility of use of ultrasound and/or kinesiotaping for pain management. Will continue to progress protocol as able, focusing on maximizing functional use of RUE.  PERFORMANCE DEFICITS in functional skills including ADLs, IADLs, coordination, dexterity, edema, ROM, strength, pain, flexibility, FMC, GMC, body mechanics, decreased knowledge of precautions, decreased knowledge of use of DME, wound, skin integrity, and UE functional use.  IMPAIRMENTS are limiting patient from ADLs, IADLs, and rest and sleep.   COMORBIDITIES may have co-morbidities  that affects occupational performance. Patient will benefit from skilled OT to address above impairments and improve overall function.   PLAN: OT FREQUENCY: 2x/week  OT DURATION: 8 weeks  PLANNED INTERVENTIONS: self care/ADL training, therapeutic exercise, therapeutic activity, manual therapy, scar mobilization, passive range of motion, splinting, ultrasound, paraffin, compression bandaging, moist heat, cryotherapy, patient/family education, and DME and/or AE instructions  RECOMMENDED OTHER SERVICES:  N/A  CONSULTED AND AGREED WITH PLAN OF CARE: Patient  PLAN FOR NEXT SESSION: Continue w/ scar massage and tendon gliding/excursion exercises; begin light hand strengthening as able per tolerance (putty).  Attempt ultrasound for pain management.   Simonne Come, OTR/L 03/26/2022, 4:15 PM

## 2022-03-28 NOTE — Progress Notes (Signed)
Surgical Instructions    Your procedure is scheduled on Monday August 28th.  Report to [facility] Main Entrance "A" at 10:15 A.M., then check in with the Admitting office.  Call this number if you have problems the morning of surgery:  [phone]   If you have any questions prior to your surgery date call [phone]: Open Monday-Friday 8am-4pm    Remember:  Do not eat after midnight the night before your surgery  You may drink clear liquids until 9:15am the morning of your surgery.   Clear liquids allowed are: Water, Non-Citrus Juices (without pulp), Carbonated Beverages, Clear Tea, Black Coffee ONLY (NO MILK, CREAM OR POWDERED CREAMER of any kind), and Gatorade   Enhanced Recovery after Surgery for Orthopedics Enhanced Recovery after Surgery is a protocol used to improve the stress on your body and your recovery after surgery.  Patient Instructions  The day of surgery (if you have diabetes):  Drink ONE small 10 oz bottle of water by __9:15___ am the morning of surgery This bottle was given to you during your hospital  pre-op appointment visit.  Nothing else to drink after completing the  Small 10 oz bottle of water.         If you have questions, please contact your surgeon's office.     Take these medicines the morning of surgery with A SIP OF WATER: amLODipine (NORVASC) 2.5 MG tablet buPROPion (ZYBAN) 150 MG 12 hr tablet pantoprazole (PROTONIX) 40 MG tablet    IF NEEDED  acetaminophen (TYLENOL) 500 MG tablet nitroGLYCERIN (NITROSTAT) 0.4 MG SL tablet   Follow your surgeon's instructions on when to stop Aspirin.  If no instructions were given by your surgeon then you will need to call the office to get those instructions.    As of today, STOP taking any Aspirin (unless otherwise instructed by your surgeon) Aleve, Naproxen, Ibuprofen, Motrin, Advil, Goody's, BC's, all herbal medications, fish oil, and all vitamins.   WHAT DO I DO ABOUT MY DIABETES  MEDICATION?  HOLD your [name] for 72 hours (3 days) prior to surgery  Do not take oral diabetes medicines (Farxiga, Metformin) the morning of surgery.    HOW TO MANAGE YOUR DIABETES BEFORE AND AFTER SURGERY  Why is it important to control my blood sugar before and after surgery? Improving blood sugar levels before and after surgery helps healing and can limit problems. A way of improving blood sugar control is eating a healthy diet by:  Eating less sugar and carbohydrates  Increasing activity/exercise  Talking with your doctor about reaching your blood sugar goals High blood sugars (greater than 180 mg/dL) can raise your risk of infections and slow your recovery, so you will need to focus on controlling your diabetes during the weeks before surgery. Make sure that the doctor who takes care of your diabetes knows about your planned surgery including the date and location.  How do I manage my blood sugar before surgery? Check your blood sugar at least 4 times a day, starting 2 days before surgery, to make sure that the level is not too high or low.  Check your blood sugar the morning of your surgery when you wake up and every 2 hours until you get to the Short Stay unit.  If your blood sugar is less than 70 mg/dL, you will need to treat for low blood sugar: Do not take insulin. Treat a low blood sugar (less than 70 mg/dL) with  cup of clear juice (cranberry or apple), 4  glucose tablets, OR glucose gel. Recheck blood sugar in 15 minutes after treatment (to make sure it is greater than 70 mg/dL). If your blood sugar is not greater than 70 mg/dL on recheck, call [phone] for further instructions. Report your blood sugar to the short stay nurse when you get to Short Stay.  If you are admitted to the hospital after surgery: Your blood sugar will be checked by the staff and you will probably be given insulin after surgery (instead of oral diabetes medicines) to make sure you have good  blood sugar levels. The goal for blood sugar control after surgery is 80-180 mg/dL.  Oral Hygiene is also important to reduce your risk of infection.  Remember - BRUSH YOUR TEETH THE MORNING OF SURGERY WITH YOUR REGULAR TOOTHPASTE    [place]- Preparing for Total Shoulder Arthroplasty  Before surgery, you can play an important role. Because skin is not sterile, your skin needs to be as free of germs as possible. You can reduce the number of germs on your skin by using the following products.   Benzoyl Peroxide Gel  o Reduces the number of germs present on the skin  o Applied twice a day to shoulder area starting two days before surgery   Chlorhexidine Gluconate (CHG) Soap (instructions listed above on how to wash with CHG Soap)  o An antiseptic cleaner that kills germs and bonds with the skin to continue killing germs even after washing  o Used for showering the night before surgery and morning of surgery   ==================================================================  Please follow these instructions carefully:  BENZOYL PEROXIDE 5% GEL  Please do not use if you have an allergy to benzoyl peroxide. If your skin becomes reddened/irritated stop using the benzoyl peroxide.  Starting two days before surgery, apply as follows:  1. Apply benzoyl peroxide in the morning and at night. Apply after taking a shower. If you are not taking a shower clean entire shoulder front, back, and side along with the armpit with a clean wet washcloth.  2. Place a quarter-sized dollop on your SHOULDER and rub in thoroughly, making sure to cover the front, back, and side of your shoulder, along with the armpit.   2 Days prior to Surgery First Dose on _____________ Morning Second Dose on ______________ Night  Day Before Surgery First Dose on ______________ Morning Night before surgery wash (entire body except face and private areas) with CHG Soap THEN Second Dose on ____________ Night    Morning of Surgery  wash BODY AGAIN with CHG Soap   4. Do NOT apply benzoyl peroxide gel on the day of surgery   [place]- Preparing For Surgery  Before surgery, you can play an important role. Because skin is not sterile, your skin needs to be as free of germs as possible. You can reduce the number of germs on your skin by washing with CHG (chlorahexidine gluconate) Soap before surgery.  CHG is an antiseptic cleaner which kills germs and bonds with the skin to continue killing germs even after washing.     Please do not use if you have an allergy to CHG or antibacterial soaps. If your skin becomes reddened/irritated stop using the CHG.  Do not shave (including legs and underarms) for at least 48 hours prior to first CHG shower. It is OK to shave your face.  Please follow these instructions carefully.     Shower the NIGHT BEFORE SURGERY and the MORNING OF SURGERY with CHG Soap.   If  you chose to wash your hair, wash your hair first as usual with your normal shampoo. After you shampoo, rinse your hair and body thoroughly to remove the shampoo.  Then [employer] and genitals (private parts) with your normal soap and rinse thoroughly to remove soap.  After that Use CHG Soap as you would any other liquid soap. You can apply CHG directly to the skin and wash gently with a scrungie or a clean washcloth.   Apply the CHG Soap to your body ONLY FROM THE NECK DOWN.  Do not use on open wounds or open sores. Avoid contact with your eyes, ears, mouth and genitals (private parts). Wash Face and genitals (private parts)  with your normal soap.   Wash thoroughly, paying special attention to the area where your surgery will be performed.  Thoroughly rinse your body with warm water from the neck down.  DO NOT shower/wash with your normal soap after using and rinsing off the CHG Soap.  Pat yourself dry with a CLEAN TOWEL.  8. Apply the Benzoyl Peroxide only the night before surgery.  Do Not use it  the morning of surgery.  Wear CLEAN PAJAMAS to bed the night before surgery  Place CLEAN SHEETS on your bed the night before your surgery  DO NOT SLEEP WITH PETS.   Day of Surgery: Take a shower with CHG soap. Wear Clean/Comfortable clothing the morning of surgery Do not apply any deodorants/lotions.   Remember to brush your teeth WITH YOUR REGULAR TOOTHPASTE.   Please read over the following fact sheets that you were given.           Do not wear jewelry or makeup. Do not wear lotions, powders, perfumes or deodorant. Do not shave 48 hours prior to surgery.   Do not bring valuables to the hospital. Do not wear nail polish, gel polish, artificial nails, or any other type of covering on natural nails (fingers and toes) If you have artificial nails or gel coating that need to be removed by a nail salon, please have this removed prior to surgery. Artificial nails or gel coating may interfere with anesthesia's ability to adequately monitor your vital signs.  [facility] is not responsible for any belongings or valuables.    Do NOT Smoke (Tobacco/Vaping)  24 hours prior to your procedure  If you use a CPAP at night, you may bring your mask for your overnight stay.   Contacts, glasses, hearing aids, dentures or partials may not be worn into surgery, please bring cases for these belongings   For patients admitted to the hospital, discharge time will be determined by your treatment team.   Patients discharged the day of surgery will not be allowed to drive home, and someone needs to stay with them for 24 hours.   SURGICAL WAITING ROOM VISITATION Patients having surgery or a procedure may have no more than 2 support people in the waiting area - these visitors may rotate.   Children under the age of 30 must have an adult with them who is not the patient. If the patient needs to stay at the hospital during part of their recovery, the visitor guidelines for inpatient rooms apply. Pre-op  nurse will coordinate an appropriate time for 1 support person to accompany patient in pre-op.  This support person may not rotate.   Please refer to the [facility] website for the visitor guidelines for Inpatients (after your surgery is over and you are in a regular room).  Special instructions:    Oral Hygiene is also important to reduce your risk of infection.  Remember - BRUSH YOUR TEETH THE MORNING OF SURGERY WITH YOUR REGULAR TOOTHPASTE     If you received a COVID test during your pre-op visit, it is requested that you wear a mask when out in public, stay away from anyone that may not be feeling well, and notify your surgeon if you develop symptoms. If you have been in contact with anyone that has tested positive in the last 10 days, please notify your surgeon.    Please read over the following fact sheets that you were given.

## 2022-03-31 ENCOUNTER — Encounter: Payer: Medicare Other | Admitting: Occupational Therapy

## 2022-03-31 ENCOUNTER — Encounter (HOSPITAL_COMMUNITY): Payer: Self-pay

## 2022-03-31 ENCOUNTER — Encounter (HOSPITAL_COMMUNITY)
Admission: RE | Admit: 2022-03-31 | Discharge: 2022-03-31 | Disposition: A | Discharge status: Home / Self Care | Payer: Medicare Other | Source: Ambulatory Visit | Attending: Orthopaedic Surgery | Admitting: Orthopaedic Surgery

## 2022-03-31 ENCOUNTER — Other Ambulatory Visit: Payer: Self-pay

## 2022-03-31 VITALS — BP 131/72 | HR 82 | Temp 97.8°F | Resp 17 | Ht 59.0 in | Wt 110.9 lb

## 2022-03-31 DIAGNOSIS — Z01812 Encounter for preprocedural laboratory examination: Secondary | ICD-10-CM | POA: Insufficient documentation

## 2022-03-31 DIAGNOSIS — R0789 Other chest pain: Secondary | ICD-10-CM | POA: Insufficient documentation

## 2022-03-31 DIAGNOSIS — Z79899 Other long term (current) drug therapy: Secondary | ICD-10-CM | POA: Insufficient documentation

## 2022-03-31 DIAGNOSIS — E785 Hyperlipidemia, unspecified: Secondary | ICD-10-CM | POA: Diagnosis not present

## 2022-03-31 DIAGNOSIS — I2510 Atherosclerotic heart disease of native coronary artery without angina pectoris: Secondary | ICD-10-CM

## 2022-03-31 DIAGNOSIS — R55 Syncope and collapse: Secondary | ICD-10-CM | POA: Diagnosis not present

## 2022-03-31 DIAGNOSIS — E119 Type 2 diabetes mellitus without complications: Secondary | ICD-10-CM | POA: Diagnosis not present

## 2022-03-31 DIAGNOSIS — I10 Essential (primary) hypertension: Secondary | ICD-10-CM | POA: Diagnosis not present

## 2022-03-31 DIAGNOSIS — Z01818 Encounter for other preprocedural examination: Secondary | ICD-10-CM

## 2022-03-31 LAB — BASIC METABOLIC PANEL
Anion gap: 10 (ref 5–15)
BUN: 12 mg/dL (ref 8–23)
CO2: 25 mmol/L (ref 22–32)
Calcium: 9.6 mg/dL (ref 8.9–10.3)
Chloride: 104 mmol/L (ref 98–111)
Creatinine, Ser: 0.66 mg/dL (ref 0.44–1.00)
GFR, Estimated: >60 mL/min (ref >60)
Glucose, Bld: 114 mg/dL — ABNORMAL HIGH (ref 70–99)
Potassium: 3.7 mmol/L (ref 3.5–5.1)
Sodium: 139 mmol/L (ref 135–145)

## 2022-03-31 LAB — CBC
HCT: 46.2 % — ABNORMAL HIGH (ref 36.0–46.0)
Hemoglobin: 15.2 g/dL — ABNORMAL HIGH (ref 12.0–15.0)
MCH: 31.1 pg (ref 26.0–34.0)
MCHC: 32.9 g/dL (ref 30.0–36.0)
MCV: 94.7 fL (ref 80.0–100.0)
Platelets: 296 10*3/uL (ref 150–400)
RBC: 4.88 MIL/uL (ref 3.87–5.11)
RDW: 13.5 % (ref 11.5–15.5)
WBC: 6.2 10*3/uL (ref 4.0–10.5)
nRBC: 0 % (ref 0.0–0.2)

## 2022-03-31 LAB — GLUCOSE, CAPILLARY: Glucose-Capillary: 127 mg/dL — ABNORMAL HIGH (ref 70–99)

## 2022-03-31 LAB — SURGICAL PCR SCREEN
MRSA, PCR: NEGATIVE
Staphylococcus aureus: NEGATIVE

## 2022-03-31 NOTE — Progress Notes (Signed)
Surgical Instructions    Your procedure is scheduled on Monday August 28th.  Report to [facility] Main Entrance "A" at 10:15 A.M., then check in with the Admitting office.  Call this number if you have problems the morning of surgery:  [phone]   If you have any questions prior to your surgery date call [phone]: Open Monday-Friday 8am-4pm    Remember:  Do not eat after midnight the night before your surgery  You may drink clear liquids until 9:15am (Three hours prior to surgery) the morning of your surgery.   Clear liquids allowed are: Water, Non-Citrus Juices (without pulp), Carbonated Beverages, Clear Tea, Black Coffee ONLY (NO MILK, CREAM OR POWDERED CREAMER of any kind), and Gatorade   Patient Instructions  The night before surgery:  No food after midnight. ONLY clear liquids after midnight   The day of surgery (if you have diabetes): Drink ONE (1) 12 oz G2 given to you in your pre admission testing appointment by 9:15 AM (3 hours before surgery) the morning of surgery. Drink in one sitting. Do not sip.  This drink was given to you during your hospital  pre-op appointment visit.  Nothing else to drink after completing the  12 oz bottle of G2.         If you have questions, please contact your surgeon's office.     Take these medicines the morning of surgery with A SIP OF WATER: amLODipine (NORVASC) 2.5 MG tablet buPROPion (ZYBAN) 150 MG 12 hr tablet pantoprazole (PROTONIX) 40 MG tablet acetaminophen (TYLENOL) 500 MG tablet if needed  Follow your surgeon's instructions on when to stop Aspirin.  If no instructions were given by your surgeon then you will need to call the office to get those instructions.    As of today, STOP taking any Aleve, Naproxen, Ibuprofen, Motrin, Advil, Goody's, BC's, all herbal medications, fish oil, and all vitamins.   WHAT DO I DO ABOUT MY DIABETES MEDICATION?  HOLD your [name] for 72 hours (3 days) prior to surgery  Do not take  oral diabetes medicines (Farxiga, Metformin) the morning of surgery.    HOW TO MANAGE YOUR DIABETES BEFORE AND AFTER SURGERY  Why is it important to control my blood sugar before and after surgery? Improving blood sugar levels before and after surgery helps healing and can limit problems. A way of improving blood sugar control is eating a healthy diet by:  Eating less sugar and carbohydrates  Increasing activity/exercise  Talking with your doctor about reaching your blood sugar goals High blood sugars (greater than 180 mg/dL) can raise your risk of infections and slow your recovery, so you will need to focus on controlling your diabetes during the weeks before surgery. Make sure that the doctor who takes care of your diabetes knows about your planned surgery including the date and location.  How do I manage my blood sugar before surgery? Check your blood sugar at least 4 times a day, starting 2 days before surgery, to make sure that the level is not too high or low.  Check your blood sugar the morning of your surgery when you wake up and every 2 hours until you get to the Short Stay unit.  If your blood sugar is less than 70 mg/dL, you will need to treat for low blood sugar: Do not take insulin. Treat a low blood sugar (less than 70 mg/dL) with  cup of clear juice (cranberry or apple), 4 glucose tablets, OR glucose gel. Recheck blood  sugar in 15 minutes after treatment (to make sure it is greater than 70 mg/dL). If your blood sugar is not greater than 70 mg/dL on recheck, call [phone] for further instructions. Report your blood sugar to the short stay nurse when you get to Short Stay.  If you are admitted to the hospital after surgery: Your blood sugar will be checked by the staff and you will probably be given insulin after surgery (instead of oral diabetes medicines) to make sure you have good blood sugar levels. The goal for blood sugar control after surgery is 80-180  mg/dL.      [facility]- Preparing for Total Shoulder Arthroplasty  Before surgery, you can play an important role. Because skin is not sterile, your skin needs to be as free of germs as possible. You can reduce the number of germs on your skin by using the following products.   Benzoyl Peroxide Gel  o Reduces the number of germs present on the skin  o Applied twice a day to shoulder area starting two days before surgery   Chlorhexidine Gluconate (CHG) Soap (instructions listed above on how to wash with CHG Soap)  o An antiseptic cleaner that kills germs and bonds with the skin to continue killing germs even after washing  o Used for showering the night before surgery and morning of surgery   ==================================================================  Please follow these instructions carefully:  BENZOYL PEROXIDE 5% GEL  Please do not use if you have an allergy to benzoyl peroxide. If your skin becomes reddened/irritated stop using the benzoyl peroxide.  Starting two days before surgery, apply as follows:  1. Apply benzoyl peroxide in the morning and at night. Apply after taking a shower. If you are not taking a shower clean entire shoulder front, back, and side along with the armpit with a clean wet washcloth.  2. Place a quarter-sized dollop on your SHOULDER and rub in thoroughly, making sure to cover the front, back, and side of your shoulder, along with the armpit.   2 Days prior to Surgery First Dose on Saturday 04/05/22 Morning Second Dose on Saturday 04/05/22 Night  Day Before Surgery First Dose on Sunday 04/06/22 Morning Night before surgery wash (entire body except face and private areas) with CHG Soap THEN Second Dose on Sunday 04/06/22 Night   Morning of Surgery  wash BODY AGAIN with CHG Soap   4. Do NOT apply benzoyl peroxide gel on the day of surgery   - Preparing For Surgery  Before surgery, you can play an important role. Because  skin is not sterile, your skin needs to be as free of germs as possible. You can reduce the number of germs on your skin by washing with CHG (chlorahexidine gluconate) Soap before surgery.  CHG is an antiseptic cleaner which kills germs and bonds with the skin to continue killing germs even after washing.     Please do not use if you have an allergy to CHG or antibacterial soaps. If your skin becomes reddened/irritated stop using the CHG.  Do not shave (including legs and underarms) for at least 48 hours prior to first CHG shower. It is OK to shave your face.  Please follow these instructions carefully.     Shower the NIGHT BEFORE SURGERY and the MORNING OF SURGERY with CHG Soap.   If you chose to wash your hair, wash your hair first as usual with your normal shampoo. After you shampoo, rinse your hair and body thoroughly to remove  the shampoo.  Then [employer] and genitals (private parts) with your normal soap and rinse thoroughly to remove soap.  After that Use CHG Soap as you would any other liquid soap. You can apply CHG directly to the skin and wash gently with a scrungie or a clean washcloth.   Apply the CHG Soap to your body ONLY FROM THE NECK DOWN.  Do not use on open wounds or open sores. Avoid contact with your eyes, ears, mouth and genitals (private parts). Wash Face and genitals (private parts)  with your normal soap.   Wash thoroughly, paying special attention to the area where your surgery will be performed.  Thoroughly rinse your body with warm water from the neck down.  DO NOT shower/wash with your normal soap after using and rinsing off the CHG Soap.  Pat yourself dry with a CLEAN TOWEL.  8. Apply the Benzoyl Peroxide only the night before surgery.  Do Not use it the morning of surgery.  Wear CLEAN PAJAMAS to bed the night before surgery  Place CLEAN SHEETS on your bed the night before your surgery  DO NOT SLEEP WITH PETS.   Day of Surgery: Take a shower with CHG  soap. Wear Clean/Comfortable clothing the morning of surgery Do not wear jewelry or makeup. Do not wear lotions, powders, perfumes or deodorant. Do not shave 48 hours prior to surgery.   Do not bring valuables to the hospital. Do not wear nail polish, gel polish, artificial nails, or any other type of covering on natural nails (fingers and toes) If you have artificial nails or gel coating that need to be removed by a nail salon, please have this removed prior to surgery. Artificial nails or gel coating may interfere with anesthesia's ability to adequately monitor your vital signs. Remember to brush your teeth WITH YOUR REGULAR TOOTHPASTE.   Please read over the following fact sheets that you were given.            [facility] is not responsible for any belongings or valuables.    Do NOT Smoke (Tobacco/Vaping)  24 hours prior to your procedure  If you use a CPAP at night, you may bring your mask for your overnight stay.   Contacts, glasses, hearing aids, dentures or partials may not be worn into surgery, please bring cases for these belongings   For patients admitted to the hospital, discharge time will be determined by your treatment team.   Patients discharged the day of surgery will not be allowed to drive home, and someone needs to stay with them for 24 hours.   SURGICAL WAITING ROOM VISITATION Patients having surgery or a procedure may have no more than 2 support people in the waiting area - these visitors may rotate.   Children under the age of 71 must have an adult with them who is not the patient. If the patient needs to stay at the hospital during part of their recovery, the visitor guidelines for inpatient rooms apply. Pre-op nurse will coordinate an appropriate time for 1 support person to accompany patient in pre-op.  This support person may not rotate.   Please refer to the [facility] website for the visitor guidelines for Inpatients (after your surgery is over and you  are in a regular room).    Special instructions:    Oral Hygiene is also important to reduce your risk of infection.  Remember - BRUSH YOUR TEETH THE MORNING OF SURGERY WITH YOUR REGULAR TOOTHPASTE  If you received a COVID test during your pre-op visit, it is requested that you wear a mask when out in public, stay away from anyone that may not be feeling well, and notify your surgeon if you develop symptoms. If you have been in contact with anyone that has tested positive in the last 10 days, please notify your surgeon.    Please read over the following fact sheets that you were given.

## 2022-03-31 NOTE — Progress Notes (Signed)
PCP - [name], MD Cardiologist - Daneen Schick, MD   PPM/ICD - denies  Chest x-ray - 06/21/21 EKG - 03/14/22 Stress Test - 07/03/21 ECHO - 08/29/21 Cardiac Cath - 07/22/16  Sleep Study - denies Fasting Blood Sugar - 127 at PAT, pt reports she checks her blood sugar in the AM/PM daily. Blood sugar normally 106-110 per patient. Hgb A1c 6.3 on 02/26/22 per PCP record in care everywhere.    Aspirin Instructions:Follow your surgeon's instructions on when to stop Aspirin.  If no instructions were given by your surgeon then you will need to call the office to get those instructions.     ERAS Protcol - ERAS + G2 drink   COVID TEST- N/A   Anesthesia review: Pt reports history of chest pain for years, and still has chest pain "every few weeks" with occasionally "breaking out in a sweat"- pt takes nitro for this and pain goes away after 2-3 nitroglycerin. Last chest pain "about 2 weeks ago".  Pt reports Dr. Tamala Julian is aware of this, and she has seen him recently. Patient states she was not asked to get clearance from Dr. Tamala Julian, but she saw Dr. Tamala Julian on 03/14/22. Pt reports she was asked to get clearance from her PCP- records requested. Pt with no current cardiac symptoms at PAT appointment.   Patient denies shortness of breath, fever, cough and chest pain at PAT appointment   All instructions explained to the patient, with a verbal understanding of the material. Patient agrees to go over the instructions while at home for a better understanding.  The opportunity to ask questions was provided.

## 2022-04-01 ENCOUNTER — Telehealth: Payer: Self-pay

## 2022-04-01 NOTE — Progress Notes (Signed)
Anesthesia Chart Review:  Follows with cardiology for history of obstructive coronary disease with prior stenting, well documented recurrent coronary spasm, recurrent syncope with neurally mediated features, hypertension, hyperlipidemia.  Coronary CT 09/05/2021 showed mild nonobstructive CAD with stent in the mid RCA.  Echo 08/29/2021 showed mildly decreased LV function with EF 40 to 45%.  Last seen by Dr. 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  These episodes do respond to nitroglycerin.  Last episode requiring use of nitroglycerin was ~2 weeks ago.  She was otherwise stable when last seen, no changes to management, 9 to 51-monthfollow-up recommended.  Non-insulin-dependent DM2, well controlled, last A1c 6.3 on 02/26/2022.  Preop labs reviewed, unremarkable.  EKG 03/14/2022: Sinus rhythm with occasional PVCs.  Rate 78.  Biatrial enlargement.  Nonspecific T wave abnormality.  CHEST - 2 VIEW 06/21/2021: COMPARISON:  Chest x-ray 01/23/2017   FINDINGS: Heart size and mediastinal contours are within normal limits. No suspicious pulmonary opacities identified.   No pleural effusion or pneumothorax visualized.   No acute osseous abnormality appreciated.   IMPRESSION: No acute intrathoracic process identified.  Coronary CT 09/05/2021: IMPRESSION: 1. 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 Presence of coronary stent in mid RCA.   2. Coronary calcium score of 408. This was 876percentile for age and sex matched control.   3. Normal coronary origin with right dominance.   4.  Aortic atherosclerosis.   5. Wall motion evaluated through 35-75% of RR interval. Focal hypokinesis in the mid inferior, inferolateral, and anterolateral wall. Recommend correlation with other imaging modality.  TTE 08/29/2021:  1. Left ventricular ejection fraction, by estimation, is 40 to 45%. The  left  ventricle has mildly decreased function. The left ventricle  demonstrates global hypokinesis. There is mild asymmetric left ventricular  hypertrophy of the septal segment. Left  ventricular diastolic parameters are consistent with Grade I diastolic  dysfunction (impaired relaxation). The average left ventricular global  longitudinal strain is -13.5 %. The global longitudinal strain is  abnormal.   2. Right ventricular systolic function is normal. The right ventricular  size is normal.   3. The mitral valve is normal in structure. Mild mitral valve  regurgitation. No evidence of mitral stenosis.   4. The aortic valve is normal in structure. Aortic valve regurgitation is  not visualized. Aortic valve sclerosis is present, with no evidence of  aortic valve stenosis.   5. The inferior vena cava is normal in size with greater than 50%  respiratory variability, suggesting right atrial pressure of 3 mmHg.   Comparison(s): A prior study was performed on 05/15/2019. The EF is  slightly depressed was 45-50%, now 40-45%.  Nuclear stress 07/03/2021:   Findings are consistent with prior myocardial infarction. The study is intermediate risk.   LV perfusion is abnormal. Defect 1: There is a medium sized defect with severe reduction in uptake present in the apical to basal anterolateral and inferolateral location(s) that is fixed. There is abnormal wall motion in the defect area. Consistent with infarction.   Left ventricular function is abnormal. Nuclear stress EF: 40 %. The left ventricular ejection fraction is moderately decreased (30-44%). End diastolic cavity size is normal.   No ST deviation was noted during stress test.   Compared to prior report in 2017, there is no significant change.    JWynonia MustyMAllied Services Rehabilitation HospitalShort Stay [facility]/Anesthesiology Phone ([phone]/22/2023 1:54 PM

## 2022-04-01 NOTE — Anesthesia Preprocedure Evaluation (Addendum)
Anesthesia Evaluation  Patient identified by MRN, date of birth, ID band Patient awake    Reviewed: Allergy & Precautions, NPO status , Patient's Chart, lab work & pertinent test results  Airway Mallampati: II       Dental  (+) Teeth Intact, Dental Advisory Given   Pulmonary Current Smoker and Patient abstained from smoking.,    Pulmonary exam normal        Cardiovascular hypertension, Pt. on medications + CAD, + Past MI and + Cardiac Stents  Normal cardiovascular exam     Neuro/Psych PSYCHIATRIC DISORDERS Anxiety Depression    GI/Hepatic GERD  Medicated and Controlled,  Endo/Other  diabetes, Well Controlled, Type 2, Oral Hypoglycemic Agents  Renal/GU   negative genitourinary   Musculoskeletal   Abdominal Normal abdominal exam  (+)   Peds  Hematology negative hematology ROS (+)   Anesthesia Other Findings IMPRESSIONS    1. Left ventricular ejection fraction, by estimation, is 40 to 45%. The  left ventricle has mildly decreased function. The left ventricle  demonstrates global hypokinesis. There is mild asymmetric left ventricular  hypertrophy of the septal segment. Left  ventricular diastolic parameters are consistent with Grade I diastolic  dysfunction (impaired relaxation). The average left ventricular global  longitudinal strain is -13.5 %. The global longitudinal strain is  abnormal.  2. Right ventricular systolic function is normal. The right ventricular  size is normal.  3. The mitral valve is normal in structure. Mild mitral valve  regurgitation. No evidence of mitral stenosis.  4. The aortic valve is normal in structure. Aortic valve regurgitation is  not visualized. Aortic valve sclerosis is present, with no evidence of  aortic valve stenosis.  5. The inferior vena cava is normal in size with greater than 50%  respiratory variability, suggesting right atrial pressure of 3 mmHg.    Comparison(s): A prior study was performed on 05/15/2019. The EF is  slightly depressed was 45-50%, now 40-45%.  .  Findings are consistent with prior myocardial infarction. The study is intermediate risk. .  LV perfusion is abnormal. Defect 1: There is a medium sized defect with severe reduction in uptake present in the apical to basal anterolateral and inferolateral location(s) that is fixed. There is abnormal wall motion in the defect area. Consistent with infarction. .  Left ventricular function is abnormal. Nuclear stress EF: 40 %. The left ventricular ejection fraction is moderately decreased (30-44%). End diastolic cavity size is normal. .  No ST deviation was noted during stress test. .  Compared to prior report in 2017, there is no significant change.    Reproductive/Obstetrics                          Anesthesia Physical Anesthesia Plan  ASA: 3  Anesthesia Plan: General   Post-op Pain Management: Regional block*   Induction: Intravenous  PONV Risk Score and Plan: 2 and Ondansetron and Treatment may vary due to age or medical condition  Airway Management Planned: Oral ETT  Additional Equipment: None  Intra-op Plan:   Post-operative Plan: Extubation in OR  Informed Consent: I have reviewed the patients History and Physical, chart, labs and discussed the procedure including the risks, benefits and alternatives for the proposed anesthesia with the patient or authorized representative who has indicated his/her understanding and acceptance.     Dental advisory given  Plan Discussed with: CRNA  Anesthesia Plan Comments: (PAT note by Antionette Poles, PA-C:  Follows with cardiology for  history of obstructive coronary disease with prior stenting, well documented recurrent coronary spasm, recurrent syncope with neurally mediated features, hypertension, hyperlipidemia.  Coronary CT 09/05/2021 showed mild nonobstructive CAD with stent in the mid RCA.  Echo  08/29/2021 showed mildly decreased LV function with EF 40 to 45%.  Last seen by Dr. 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  These episodes do respond to nitroglycerin.  Last episode requiring use of nitroglycerin was ~2 weeks ago.  She was otherwise stable when last seen, no changes to management, 9 to 42-month follow-up recommended.  Cardiac clearance per telephone encounter 04/02/22, "Chart reviewed as part of pre-operative protocol coverage. Given past medical history and time since last visit, based on ACC/AHA guidelines,Jillian Weddingtonwould be at acceptable risk for the planned procedure without further cardiovascular testing. Her RCRI is a class III risk, 6.6% risk of major cardiac event."  Non-insulin-dependent DM2, well controlled, last A1c 6.3 on 02/26/2022.  Preop labs reviewed, unremarkable.  EKG 03/14/2022: Sinus rhythm with occasional PVCs.  Rate 78.  Biatrial enlargement.  Nonspecific T wave abnormality.  CHEST - 2 VIEW 06/21/2021: COMPARISON: Chest x-ray 01/23/2017  FINDINGS: Heart size and mediastinal contours are within normal limits. No suspicious pulmonary opacities identified.  No pleural effusion or pneumothorax visualized.  No acute osseous abnormality appreciated.  IMPRESSION: No acute intrathoracic process identified.  Coronary CT 09/05/2021: IMPRESSION: 1. 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 Presence of coronary stent in mid RCA.  2. Coronary calcium score of 408. This was 78 percentile for age and sex matched control.  3. Normal coronary origin with right dominance.  4. Aortic atherosclerosis.  5. Wall motion evaluated through 35-75% of RR interval. Focal hypokinesis in the mid inferior, inferolateral, and anterolateral wall. Recommend correlation with other imaging modality.  TTE 08/29/2021: 1. Left ventricular ejection  fraction, by estimation, is 40 to 45%. The  left ventricle has mildly decreased function. The left ventricle  demonstrates global hypokinesis. There is mild asymmetric left ventricular  hypertrophy of the septal segment. Left  ventricular diastolic parameters are consistent with Grade I diastolic  dysfunction (impaired relaxation). The average left ventricular global  longitudinal strain is -13.5 %. The global longitudinal strain is  abnormal.  2. Right ventricular systolic function is normal. The right ventricular  size is normal.  3. The mitral valve is normal in structure. Mild mitral valve  regurgitation. No evidence of mitral stenosis.  4. The aortic valve is normal in structure. Aortic valve regurgitation is  not visualized. Aortic valve sclerosis is present, with no evidence of  aortic valve stenosis.  5. The inferior vena cava is normal in size with greater than 50%  respiratory variability, suggesting right atrial pressure of 3 mmHg.   Comparison(s): A prior study was performed on 05/15/2019. The EF is  slightly depressed was 45-50%, now 40-45%.  Nuclear stress 07/03/2021: . Findings are consistent with prior myocardial infarction. The study is intermediate risk. . LV perfusion is abnormal. Defect 1: There is a medium sized defect with severe reduction in uptake present in the apical to basal anterolateral and inferolateral location(s) that is fixed. There is abnormal wall motion in the defect area. Consistent with infarction. . Left ventricular function is abnormal. Nuclear stress EF: 40 %. The left ventricular ejection fraction is moderately decreased (30-44%). End diastolic cavity size is normal. . No ST deviation was noted during stress test. .  Compared to prior report in 2017, there is no significant change.  )     Anesthesia Quick Evaluation

## 2022-04-01 NOTE — Telephone Encounter (Signed)
   Pre-operative Risk Assessment    Patient Name: [name]  DOB: March 21, 1946 MRN: [id number]      Request for Surgical Clearance    Procedure:  Right Reverse Shoulder Arthoplasty   Date of Surgery:  Clearance 04/07/22                                 Surgeon:  Dr. Vanetta Mulders Surgeon's Group or Practice Name:  [facility]  Phone number:  [phone] Fax number:  [phone]   Type of Clearance Requested:   - Medical    Type of Anesthesia:  Choice    SignedMendel Ryder   04/01/2022, 4:41 PM

## 2022-04-02 ENCOUNTER — Ambulatory Visit: Payer: Medicare Other | Admitting: Occupational Therapy

## 2022-04-02 ENCOUNTER — Encounter: Payer: Self-pay | Admitting: Occupational Therapy

## 2022-04-02 DIAGNOSIS — M6281 Muscle weakness (generalized): Secondary | ICD-10-CM

## 2022-04-02 DIAGNOSIS — R208 Other disturbances of skin sensation: Secondary | ICD-10-CM | POA: Diagnosis not present

## 2022-04-02 DIAGNOSIS — M25531 Pain in right wrist: Secondary | ICD-10-CM

## 2022-04-02 DIAGNOSIS — R278 Other lack of coordination: Secondary | ICD-10-CM | POA: Diagnosis not present

## 2022-04-02 DIAGNOSIS — M79631 Pain in right forearm: Secondary | ICD-10-CM

## 2022-04-02 NOTE — Patient Instructions (Signed)
CARPAL TUNNEL (Nerve Compression Syndrome): Wrist Stretch    Bent your right arm with fingers facing down. With left hand, gently pull fingers of right hand toward body. Hold position for a slow count to 5. Repeat 5 times. Complete about 3 times per day.  Copyright  VHI. All rights reserved.

## 2022-04-02 NOTE — Telephone Encounter (Addendum)
     Primary Cardiologist: [name], MD  Chart reviewed as part of pre-operative protocol coverage. Given past medical history and time since last visit, based on ACC/AHA guidelines, [name] would be at acceptable risk for the planned procedure without further cardiovascular testing.   Her RCRI is a class III risk, 6.6% risk of major cardiac event.  I will route this recommendation to the requesting party via Epic fax function and remove from pre-op pool.  Please call with questions.  [name]. Carollee Nussbaumer NP-C     04/02/2022, 12:37 PM [address] Office [phone] Fax [phone]

## 2022-04-02 NOTE — Therapy (Signed)
OUTPATIENT OCCUPATIONAL THERAPY TREATMENT NOTE  Patient Name: [name] MRN: [id number] DOB:Feb 14, 1946, 76 y.o., female Today's Date: 04/02/2022  PCP: [name], MD REFERRING PROVIDER: Sherilyn Cooter, MD    OT End of Session - 04/02/22 1106     Visit Number 5    Number of Visits 17    Date for OT Re-Evaluation 05/16/22    Authorization Type [employer]    OT Start Time 1103    OT Stop Time 1138    OT Time Calculation (min) 35 min    Activity Tolerance Patient tolerated treatment well    Behavior During Therapy [facility] for tasks assessed/performed            Past Medical History:  Diagnosis Date   Anemia    Anginal pain ([place])    last cp was last year   Anxiety    Arthritis    Blood transfusion    Chest pain 02/04/2016   Coronary artery disease    Coronary artery spasm, wth continued episodes of chest pain.  09/26/2013   Coronary vasospasm (HCC)    Diabetes mellitus without complication (HCC)    type II   GERD (gastroesophageal reflux disease)    Headache    Hiatal hernia    Hypercholesteremia    Hypertension    NSTEMI (non-ST elevated myocardial infarction) ([place]) not sure   NSVT (nonsustained ventricular tachycardia) ([place]) 09/26/2013   Panic attack    Past Surgical History:  Procedure Laterality Date   ABDOMINAL HYSTERECTOMY     PARTIAL HYSTERECTOMY   ACDF N/A    C5-6 ACDF Dr. Sherwood Gambler   breast     right, tumor benign   BREAST EXCISIONAL BIOPSY Right    1961 (age 59) benign   CARDIAC CATHETERIZATION  2014   CARDIAC CATHETERIZATION  2012   CARDIAC CATHETERIZATION N/A 07/22/2016   Procedure: Left Heart Cath and Coronary Angiography;  Surgeon: Belva Crome, MD;  Location: [place] [facility];  Service: Cardiovascular;  Laterality: N/A;   cardiac stent  2012   to RCA   CHOLECYSTECTOMY N/A 10/02/2017   Procedure: LAPAROSCOPIC CHOLECYSTECTOMY;  Surgeon: Ralene Ok, MD;  Location: [place];  Service: General;  Laterality: N/A;    CORONARY ANGIOPLASTY     ESOPHAGEAL MANOMETRY N/A 10/29/2016   Procedure: ESOPHAGEAL MANOMETRY (EM);  Surgeon: Garlan Fair, MD;  [facility];  Service: Endoscopy;  Laterality: N/A;   ESOPHAGOGASTRODUODENOSCOPY (EGD) WITH PROPOFOL N/A 10/28/2016   Procedure: ESOPHAGOGASTRODUODENOSCOPY (EGD) WITH PROPOFOL;  Surgeon: Garlan Fair, MD;  [facility];  Service: Endoscopy;  Laterality: N/A;   EXCISION OF SKIN TAG N/A 07/29/2017   Procedure: EXCISION OF PERIANAL  SKIN TAG;  Surgeon: Ralene Ok, MD;  Location: [place];  Service: General;  Laterality: N/A;   KNEE SURGERY  left   left ear surgery     for bad cut   LEFT HEART CATHETERIZATION WITH CORONARY ANGIOGRAM N/A 02/22/2013   Procedure: LEFT HEART CATHETERIZATION WITH CORONARY ANGIOGRAM;  Surgeon: [name], MD;  Location: [facility] C[facility];  Service: Cardiovascular;  Laterality: N/A;   SHOULDER SURGERY Bilateral    rotator cuff   TOE SURGERY Left    toe surgery   Patient Active Problem List   Diagnosis Date Noted   Carpal tunnel syndrome, right upper limb 01/03/2022   Chest pain 06/21/2021   Variant angina ([place]) 01/24/2017   Precordial chest pain 01/23/2017   Headache 01/23/2017   S/P angioplasty with stent  01/23/2017   Coronary artery disease 11/24/2014   Syncope 10/22/2014   Hyperglycemia 04/20/2014   H/O hiatal hernia 04/19/2014   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    Coronary artery vasospasm ([place]) 09/26/2013   HTN (hypertension) 12/08/2011   GERD (gastroesophageal reflux disease) 12/08/2011   Depression 12/08/2011   Dyslipidemia 12/08/2011    ONSET DATE: 03/10/22 (referral date)  REFERRING DIAG: G56.01 (ICD-10-CM) - Carpal tunnel syndrome, right upper limb   THERAPY DIAG:  Pain in right wrist  Pain in right forearm  Muscle weakness (generalized)  Other disturbances of skin sensation  Other lack of coordination  Rationale for Evaluation and Treatment Rehabilitation  SUBJECTIVE:   SUBJECTIVE  STATEMENT: Pt reports feeling like that base of her palm is a little swollen and she is still noticing sharp, "zinging" pain with movement Pt accompanied by: self  PERTINENT HISTORY: s/p CTR on 01/24/22; h/o cervical radiculopathy, upcoming reverse Rt TSA on 04/07/22  PRECAUTIONS: Shoulder  PAIN: Are you having pain? No  HAND DOMINANCE: Right  PLOF: Independent and pain intermittently with certain tasks  PATIENT GOALS to get my dominant hand better   OBJECTIVE:   TODAY'S TREATMENT:  Soft Tissue Mobilization Gentle soft tissue mobilization w/out lotion to R proximal palm and volar wrist/forearm to facilitate mobility within tissue structures and decrease tension, edema, and pain; pt positioned w/ wrist/hand resting on towel and tolerated intervention w/ initial sensitivity that decreased w/ incr massage  PROM OT performed PROM of R wrist extension; able to achieve approx arc of motion WFL. Pt tolerated exercise w/out discomfort. Also attempted composite wrist and hand extension w/ positive results; completed x10 slowly incorporating passive stretch held at end range for increased ROM and decreased stiffness  Tendon Gliding Tendon gliding (open hand, hook fist, full fist, MPJ flexion, straight/flat fist) x5 for R hand; OT providing modeling and verbal cues for positioning, particularly w/ MCPJ flexion and flat fit. Able to complete w/ report of mild "pulling"/tightness, but no incr pain  Median Nerve Glides Completed median nerve glides x5 slowly (hand extended w/ wrist in neutral, wrist extension, thumb abduction,, forearm sup, passive stretch to thumb); OT provided demonstration and verbal cues for understanding of positioning throughout prn  R wrist ext stretch w/ elbow flexed and arm adducted to side 5x slowly w/out difficulty or incr pain  Orthosis Management Education provided on benefit of padded gloves vs compression gloves when completing functional tasks to prevent irritation or  incr pressure from sustained grip to base of the palm; pt verbalized understanding w/ printed handout provided    PATIENT EDUCATION: See tx section above Person educated: Patient Education method: Explanation and Handouts Education comprehension: verbalized understanding and needs further education   HOME EXERCISE PROGRAM: Access Code: [email or web address] URL: [email or web address] Date: 03/24/2022 Prepared by: [facility]  Exercises - Wrist Tendon Gliding  - 3-4 x daily - 2 sets - 10 reps - Wrist Flexion Extension AROM - Palms Down  - 3-4 x daily - 2 sets - 10 reps - Seated Wrist Flexion Stretch  - 3-4 x daily - 2 sets - 10 reps  GOALS: Goals reviewed with patient? Yes  SHORT TERM GOALS: Target date:  04/18/22     Pt will be independent in HEP with focus on pain management, strengthening, massage, and desensitization strategies. Baseline: to be administered Goal status: MET - 04/02/22  Pt will verbalize understanding of adapted strategies and AE to maximize safety and Independence with ADLs/IADLs.   Baseline:  decreased knowledge of compensatory strategies Goal status: IN PROGRESS  Pt will demonstrate and/or report improved sustained grip strength to engage in ADLs/IADLs without increased pain >2/10 on pain scale and without dropping items. Baseline: Right: 8 lbs; Left: 29 lbs Goal status: IN PROGRESS  LONG TERM GOALS: Target date:  05/16/22     Pt will demonstrate improved wrist flexion/extension as evidenced by decreased report of pain during ADLs/IADLs. Baseline: 51 deg Goal status: IN PROGRESS  Pt will demonstrate improved UE functional use for ADLs as evidenced by increasing box/blocks score by 4 blocks with RUE without report of increased pain > 2/10 on pain scale. Baseline: Right 42 blocks, Left 48 blocks Goal status: IN PROGRESS  Pt will demonstrate improved functional use of dominant RUE to engage in ADLs and IADLs as  reported on QuickDASH with improved score of 50% or less Baseline: 70.45 Goal status: IN PROGRESS  Pt will demonstrate and/or report improved use of RUE during [facility] and handwriting tasks without increased pain of 2/10 on pain scale. Baseline: 8/10 Goal status: IN PROGRESS   ASSESSMENT:  CLINICAL IMPRESSION: Session today focused on review of HEP, manual therapy techniques, and introduction of median nerve glides to decrease symptoms for improved functional use of RUE. Pt arrived today w/ mild increase in edema on ulnar side of base of palm, so ultrasound modality was not incorporated this session w/ OT emphasizing importance of scar massage and correct positioning w/ tendon gliding and isolated tendon excursion exercises. Median nerve glides also introduced this session to encourage nerve mobilization for decreased symptoms. Pt will also benefit from further review of movement precautions and considerations, education on body mechanics and ergonomics, as well as introduction of potentially beneficial AE prn. Pt may be taking a short time away from OP OT as she is scheduled for TSA on Monday 8/28 and will benefit from further review of provided education due to this.  PERFORMANCE DEFICITS in functional skills including ADLs, IADLs, coordination, dexterity, edema, ROM, strength, pain, flexibility, FMC, GMC, body mechanics, decreased knowledge of precautions, decreased knowledge of use of DME, wound, skin integrity, and UE functional use.  IMPAIRMENTS are limiting patient from ADLs, IADLs, and rest and sleep.   COMORBIDITIES may have co-morbidities  that affects occupational performance. Patient will benefit from skilled OT to address above impairments and improve overall function.   PLAN: OT FREQUENCY: 2x/week  OT DURATION: 8 weeks  PLANNED INTERVENTIONS: self care/ADL training, therapeutic exercise, therapeutic activity, manual therapy, scar mobilization, passive range of motion, splinting,  ultrasound, paraffin, compression bandaging, moist heat, cryotherapy, patient/family education, and DME and/or AE instructions  RECOMMENDED OTHER SERVICES: N/A  CONSULTED AND AGREED WITH PLAN OF CARE: Patient  PLAN FOR NEXT SESSION: Review median nerve glides, progress toward goals; introduce/education on body mechanics, AE, review benefit of padded gloves, etc. (page 360)   Kathrine Cords, OTR/L 04/02/2022, 12:19 PM

## 2022-04-04 ENCOUNTER — Ambulatory Visit: Payer: Medicare Other | Admitting: Occupational Therapy

## 2022-04-04 ENCOUNTER — Telehealth: Payer: Self-pay | Admitting: Interventional Cardiology

## 2022-04-04 DIAGNOSIS — R208 Other disturbances of skin sensation: Secondary | ICD-10-CM | POA: Diagnosis not present

## 2022-04-04 DIAGNOSIS — R278 Other lack of coordination: Secondary | ICD-10-CM

## 2022-04-04 DIAGNOSIS — M79631 Pain in right forearm: Secondary | ICD-10-CM

## 2022-04-04 DIAGNOSIS — M6281 Muscle weakness (generalized): Secondary | ICD-10-CM

## 2022-04-04 DIAGNOSIS — M25531 Pain in right wrist: Secondary | ICD-10-CM | POA: Diagnosis not present

## 2022-04-04 NOTE — Telephone Encounter (Signed)
Pt c/o medication issue:  1. Name of Medication: dapagliflozin propanediol (FARXIGA) 10 MG TABS tablet  2. How are you currently taking this medication (dosage and times per day)?   3. Are you having a reaction (difficulty breathing--STAT)?   4. What is your medication issue? Pt states this medication has gotten too expensive for her. Please advise.

## 2022-04-04 NOTE — Telephone Encounter (Signed)
Returned call to patient.  Patient states she has not called for patient assistance, provided number for [place] [phone] to call and see if she qualifies for assistance to help with cost of Farxiga.  Instructed patient to call back if she is told she does not qualify for assistance.  Patient verbalized understanding and expressed appreciation for follow-up.

## 2022-04-04 NOTE — Therapy (Signed)
OUTPATIENT OCCUPATIONAL THERAPY TREATMENT NOTE  Patient Name: [name] MRN: [id number] DOB:Mar 27, 1946, 76 y.o., female Today's Date: 04/04/2022  PCP: [name], MD REFERRING PROVIDER: Sherilyn Cooter, MD    OT End of Session - 04/04/22 1133     Visit Number 6    Number of Visits 17    Date for OT Re-Evaluation 05/16/22    Authorization Type [employer]    OT Start Time 1103    OT Stop Time 1141    OT Time Calculation (min) 38 min    Activity Tolerance Patient tolerated treatment well    Behavior During Therapy W[facility] for tasks assessed/performed             Past Medical History:  Diagnosis Date   Anemia    Anginal pain ([place])    last cp was last year   Anxiety    Arthritis    Blood transfusion    Chest pain 02/04/2016   Coronary artery disease    Coronary artery spasm, wth continued episodes of chest pain.  09/26/2013   Coronary vasospasm (HCC)    Diabetes mellitus without complication (HCC)    type II   GERD (gastroesophageal reflux disease)    Headache    Hiatal hernia    Hypercholesteremia    Hypertension    NSTEMI (non-ST elevated myocardial infarction) ([place]) not sure   NSVT (nonsustained ventricular tachycardia) ([place]) 09/26/2013   Panic attack    Past Surgical History:  Procedure Laterality Date   ABDOMINAL HYSTERECTOMY     PARTIAL HYSTERECTOMY   ACDF N/A    C5-6 ACDF Dr. Sherwood Gambler   breast     right, tumor benign   BREAST EXCISIONAL BIOPSY Right    1961 (age 81) benign   CARDIAC CATHETERIZATION  2014   CARDIAC CATHETERIZATION  2012   CARDIAC CATHETERIZATION N/A 07/22/2016   Procedure: Left Heart Cath and Coronary Angiography;  Surgeon: Belva Crome, MD;  Location: [place] [facility];  Service: Cardiovascular;  Laterality: N/A;   cardiac stent  2012   to RCA   CHOLECYSTECTOMY N/A 10/02/2017   Procedure: LAPAROSCOPIC CHOLECYSTECTOMY;  Surgeon: Ralene Ok, MD;  Location: [place];  Service: General;  Laterality: N/A;    CORONARY ANGIOPLASTY     ESOPHAGEAL MANOMETRY N/A 10/29/2016   Procedure: ESOPHAGEAL MANOMETRY (EM);  Surgeon: Garlan Fair, MD;  [facility];  Service: Endoscopy;  Laterality: N/A;   ESOPHAGOGASTRODUODENOSCOPY (EGD) WITH PROPOFOL N/A 10/28/2016   Procedure: ESOPHAGOGASTRODUODENOSCOPY (EGD) WITH PROPOFOL;  Surgeon: Garlan Fair, MD;  [facility];  Service: Endoscopy;  Laterality: N/A;   EXCISION OF SKIN TAG N/A 07/29/2017   Procedure: EXCISION OF PERIANAL  SKIN TAG;  Surgeon: Ralene Ok, MD;  Location: [place];  Service: General;  Laterality: N/A;   KNEE SURGERY  left   left ear surgery     for bad cut   LEFT HEART CATHETERIZATION WITH CORONARY ANGIOGRAM N/A 02/22/2013   Procedure: LEFT HEART CATHETERIZATION WITH CORONARY ANGIOGRAM;  Surgeon: [name], MD;  Location[facility];  Service: Cardiovascular;  Laterality: N/A;   SHOULDER SURGERY Bilateral    rotator cuff   TOE SURGERY Left    toe surgery   Patient Active Problem List   Diagnosis Date Noted   Carpal tunnel syndrome, right upper limb 01/03/2022   Chest pain 06/21/2021   Variant angina ([place]) 01/24/2017   Precordial chest pain 01/23/2017   Headache 01/23/2017   S/P angioplasty with  stent 01/23/2017   Coronary artery disease 11/24/2014   Syncope 10/22/2014   Hyperglycemia 04/20/2014   H/O hiatal hernia 04/19/2014   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    Coronary artery vasospasm ([place]) 09/26/2013   HTN (hypertension) 12/08/2011   GERD (gastroesophageal reflux disease) 12/08/2011   Depression 12/08/2011   Dyslipidemia 12/08/2011    ONSET DATE: 03/10/22 (referral date)  REFERRING DIAG: G56.01 (ICD-10-CM) - Carpal tunnel syndrome, right upper limb   THERAPY DIAG:  Pain in right wrist  Pain in right forearm  Muscle weakness (generalized)  Other disturbances of skin sensation  Other lack of coordination  Rationale for Evaluation and Treatment Rehabilitation  SUBJECTIVE:   SUBJECTIVE  STATEMENT: Pt reports decreased pain with exercises, but still a "zinging" pain with wrist flexion. Pt accompanied by: self  PERTINENT HISTORY: s/p CTR on 01/24/22; h/o cervical radiculopathy, upcoming reverse Rt TSA on 04/07/22  PRECAUTIONS: Shoulder  PAIN: Are you having pain? No  HAND DOMINANCE: Right  PLOF: Independent and pain intermittently with certain tasks  PATIENT GOALS to get my dominant hand better   OBJECTIVE:   TODAY'S TREATMENT:  Soft tissue mobilization/scar mobilization Median nerve glides with resistance wrist flexion/extension x5 each Thumb abduction x10   Soft Tissue Mobilization Gentle soft tissue mobilization with lotion to R proximal palm and volar wrist/forearm to facilitate mobility within tissue structures and decrease tension, edema, and pain; pt positioned w/ wrist/hand resting on towel and tolerated intervention w/ initial sensitivity to cold and touch, however decreased w/ increased massage  PROM Pt able to return demonstration of PROM of R wrist extension and flexion; able to achieve approx arc of motion WFL. Pt tolerated exercise without discomfort with extension and mild "pulling" with wrist flexion. Pt completed x10 slowly incorporating passive stretch held at end range for increased ROM and decreased stiffness  Tendon Gliding Reviewed tendon gliding (open hand, hook fist, full fist, MPJ flexion, straight/flat fist) x5 for R hand; OT providing modeling and verbal cues for positioning, particularly w/ MCPJ flexion and flat fist as pt frequently forgetting these two positions.  Able to complete w/ report of mild "pulling"/tightness with MCP flexion but no increased pain  Median Nerve Glides Completed median nerve glides x5 slowly (hand extended w/ wrist in neutral, wrist extension, thumb abduction, and forearm supination with passive stretch to thumb); OT provided demonstration and verbal cues for understanding of positioning throughout prn     PATIENT  EDUCATION: See tx section above Person educated: Patient Education method: Explanation and Handouts Education comprehension: verbalized understanding and needs further education   HOME EXERCISE PROGRAM: Access Code: [email or web address] URL: [email or web address] Date: 03/24/2022 Prepared by: [facility]  Exercises - Wrist Tendon Gliding  - 3-4 x daily - 2 sets - 10 reps - Wrist Flexion Extension AROM - Palms Down  - 3-4 x daily - 2 sets - 10 reps - Seated Wrist Flexion Stretch  - 3-4 x daily - 2 sets - 10 reps  GOALS: Goals reviewed with patient? Yes  SHORT TERM GOALS: Target date:  04/18/22     Pt will be independent in HEP with focus on pain management, strengthening, massage, and desensitization strategies. Baseline: to be administered Goal status: MET - 04/02/22  Pt will verbalize understanding of adapted strategies and AE to maximize safety and Independence with ADLs/IADLs.   Baseline: decreased knowledge of compensatory strategies Goal status: IN PROGRESS  Pt will demonstrate and/or report improved sustained grip strength to engage in ADLs/IADLs without  increased pain >2/10 on pain scale and without dropping items. Baseline: Right: 8 lbs; Left: 29 lbs Goal status: IN PROGRESS  LONG TERM GOALS: Target date:  05/16/22     Pt will demonstrate improved wrist flexion/extension as evidenced by decreased report of pain during ADLs/IADLs. Baseline: 51 deg Goal status: IN PROGRESS  Pt will demonstrate improved UE functional use for ADLs as evidenced by increasing box/blocks score by 4 blocks with RUE without report of increased pain > 2/10 on pain scale. Baseline: Right 42 blocks, Left 48 blocks Goal status: IN PROGRESS  Pt will demonstrate improved functional use of dominant RUE to engage in ADLs and IADLs as reported on QuickDASH with improved score of 50% or less Baseline: 70.45 Goal status: IN PROGRESS  Pt will demonstrate and/or  report improved use of RUE during [facility] and handwriting tasks without increased pain of 2/10 on pain scale. Baseline: 8/10 Goal status: IN PROGRESS   ASSESSMENT:  CLINICAL IMPRESSION: Treatment session with focus on review of HEP, manual therapy techniques, and introduction of median nerve glides to decrease symptoms for improved functional use of RUE. Pt arrived today w/ mild increase in edema on ulnar side of base of palm, so ultrasound modality was not incorporated this session w/ OT emphasizing importance of scar massage and correct positioning w/ tendon gliding and isolated tendon excursion exercises. Median nerve glides reviewed this session to encourage nerve mobilization for decreased symptoms, pt with good recall and technique this session. Pt will also benefit from further review of movement precautions and considerations, education on body mechanics and ergonomics, as well as introduction of potentially beneficial AE prn. Reviewed recommendations to obtain padded vs edema glove for hand.  Pt to take ~ 2 week break from therapy for TSA surgery on 8/28 and to allow for initial focus on shoulder recovery.  PERFORMANCE DEFICITS in functional skills including ADLs, IADLs, coordination, dexterity, edema, ROM, strength, pain, flexibility, FMC, GMC, body mechanics, decreased knowledge of precautions, decreased knowledge of use of DME, wound, skin integrity, and UE functional use.  IMPAIRMENTS are limiting patient from ADLs, IADLs, and rest and sleep.   COMORBIDITIES may have co-morbidities  that affects occupational performance. Patient will benefit from skilled OT to address above impairments and improve overall function.   PLAN: OT FREQUENCY: 2x/week  OT DURATION: 8 weeks  PLANNED INTERVENTIONS: self care/ADL training, therapeutic exercise, therapeutic activity, manual therapy, scar mobilization, passive range of motion, splinting, ultrasound, paraffin, compression bandaging, moist heat,  cryotherapy, patient/family education, and DME and/or AE instructions  RECOMMENDED OTHER SERVICES: N/A  CONSULTED AND AGREED WITH PLAN OF CARE: Patient  PLAN FOR NEXT SESSION: Review median nerve glides, progress toward goals; introduce/education on body mechanics, AE, review benefit of padded gloves, etc. (page 360)   Jonelle Bann, [place], OTR/L 04/04/2022, 11:33 AM

## 2022-04-07 ENCOUNTER — Ambulatory Visit (HOSPITAL_BASED_OUTPATIENT_CLINIC_OR_DEPARTMENT_OTHER): Payer: Medicare Other | Admitting: Anesthesiology

## 2022-04-07 ENCOUNTER — Encounter (HOSPITAL_COMMUNITY): Payer: Self-pay | Admitting: Orthopaedic Surgery

## 2022-04-07 ENCOUNTER — Encounter (HOSPITAL_COMMUNITY)
Admission: RE | Disposition: A | Discharge status: Home / Self Care | Payer: Self-pay | Source: Home / Self Care | Attending: Orthopaedic Surgery

## 2022-04-07 ENCOUNTER — Other Ambulatory Visit (HOSPITAL_BASED_OUTPATIENT_CLINIC_OR_DEPARTMENT_OTHER): Payer: Self-pay

## 2022-04-07 ENCOUNTER — Other Ambulatory Visit: Payer: Self-pay | Admitting: Physician Assistant

## 2022-04-07 ENCOUNTER — Other Ambulatory Visit: Payer: Self-pay

## 2022-04-07 ENCOUNTER — Ambulatory Visit (HOSPITAL_COMMUNITY): Payer: Medicare Other

## 2022-04-07 ENCOUNTER — Ambulatory Visit (HOSPITAL_COMMUNITY)
Admission: RE | Admit: 2022-04-07 | Discharge: 2022-04-07 | Disposition: A | Discharge status: Home / Self Care | Payer: Medicare Other | Attending: Orthopaedic Surgery | Admitting: Orthopaedic Surgery

## 2022-04-07 ENCOUNTER — Ambulatory Visit (HOSPITAL_COMMUNITY): Payer: Medicare Other | Admitting: Physician Assistant

## 2022-04-07 DIAGNOSIS — F17210 Nicotine dependence, cigarettes, uncomplicated: Secondary | ICD-10-CM

## 2022-04-07 DIAGNOSIS — Z96611 Presence of right artificial shoulder joint: Secondary | ICD-10-CM | POA: Diagnosis not present

## 2022-04-07 DIAGNOSIS — K219 Gastro-esophageal reflux disease without esophagitis: Secondary | ICD-10-CM | POA: Insufficient documentation

## 2022-04-07 DIAGNOSIS — E119 Type 2 diabetes mellitus without complications: Secondary | ICD-10-CM | POA: Diagnosis not present

## 2022-04-07 DIAGNOSIS — M25511 Pain in right shoulder: Secondary | ICD-10-CM | POA: Insufficient documentation

## 2022-04-07 DIAGNOSIS — Z7984 Long term (current) use of oral hypoglycemic drugs: Secondary | ICD-10-CM | POA: Diagnosis not present

## 2022-04-07 DIAGNOSIS — Z471 Aftercare following joint replacement surgery: Secondary | ICD-10-CM | POA: Diagnosis not present

## 2022-04-07 DIAGNOSIS — F418 Other specified anxiety disorders: Secondary | ICD-10-CM | POA: Insufficient documentation

## 2022-04-07 DIAGNOSIS — I252 Old myocardial infarction: Secondary | ICD-10-CM | POA: Diagnosis not present

## 2022-04-07 DIAGNOSIS — I10 Essential (primary) hypertension: Secondary | ICD-10-CM | POA: Diagnosis not present

## 2022-04-07 DIAGNOSIS — I2510 Atherosclerotic heart disease of native coronary artery without angina pectoris: Secondary | ICD-10-CM

## 2022-04-07 DIAGNOSIS — G8918 Other acute postprocedural pain: Secondary | ICD-10-CM | POA: Diagnosis not present

## 2022-04-07 DIAGNOSIS — M12811 Other specific arthropathies, not elsewhere classified, right shoulder: Secondary | ICD-10-CM

## 2022-04-07 HISTORY — PX: REVERSE SHOULDER ARTHROPLASTY: SHX5054

## 2022-04-07 LAB — GLUCOSE, CAPILLARY
Glucose-Capillary: 108 mg/dL — ABNORMAL HIGH (ref 70–99)
Glucose-Capillary: 104 mg/dL — ABNORMAL HIGH (ref 70–99)
Glucose-Capillary: 132 mg/dL — ABNORMAL HIGH (ref 70–99)

## 2022-04-07 SURGERY — ARTHROPLASTY, SHOULDER, TOTAL, REVERSE
Anesthesia: General | Site: Shoulder | Laterality: Right

## 2022-04-07 MED ORDER — DEXAMETHASONE SODIUM PHOSPHATE 10 MG/ML IJ SOLN
INTRAMUSCULAR | Status: DC | PRN
  Administered 2022-04-07: 5 mg via INTRAVENOUS

## 2022-04-07 MED ORDER — PHENYLEPHRINE 80 MCG/ML (10ML) SYRINGE FOR IV PUSH (FOR BLOOD PRESSURE SUPPORT)
PREFILLED_SYRINGE | INTRAVENOUS | Status: AC
  Filled 2022-04-07: qty 10

## 2022-04-07 MED ORDER — ASPIRIN 325 MG PO TBEC: 325.0000 mg | DELAYED_RELEASE_TABLET | Freq: Every day | ORAL | 0 refills | Status: DC

## 2022-04-07 MED ORDER — INSULIN ASPART 100 UNIT/ML IJ SOLN: 0.0000 [IU] | INTRAMUSCULAR | Status: DC | PRN

## 2022-04-07 MED ORDER — FENTANYL CITRATE (PF) 100 MCG/2ML IJ SOLN: 25.0000 ug | INTRAMUSCULAR | Status: DC | PRN

## 2022-04-07 MED ORDER — ACETAMINOPHEN 500 MG PO TABS
500.0000 mg | ORAL_TABLET | Freq: Three times a day (TID) | ORAL | 0 refills | Status: DC
Start: 2022-04-07 — End: 2022-04-07
  Filled 2022-04-07: qty 30, 10d supply, fill #0

## 2022-04-07 MED ORDER — TRANEXAMIC ACID-NACL 1000-0.7 MG/100ML-% IV SOLN
1000.0000 mg | INTRAVENOUS | Status: AC
  Administered 2022-04-07: 1000 mg via INTRAVENOUS

## 2022-04-07 MED ORDER — ROCURONIUM BROMIDE 10 MG/ML (PF) SYRINGE
PREFILLED_SYRINGE | INTRAVENOUS | Status: DC | PRN
  Administered 2022-04-07: 10 mg via INTRAVENOUS
  Administered 2022-04-07: 60 mg via INTRAVENOUS

## 2022-04-07 MED ORDER — FENTANYL CITRATE (PF) 100 MCG/2ML IJ SOLN
INTRAMUSCULAR | Status: AC
  Administered 2022-04-07: 50 ug via INTRAVENOUS
  Filled 2022-04-07: qty 2

## 2022-04-07 MED ORDER — FENTANYL CITRATE (PF) 250 MCG/5ML IJ SOLN
INTRAMUSCULAR | Status: AC
  Filled 2022-04-07: qty 5

## 2022-04-07 MED ORDER — CEFAZOLIN SODIUM-DEXTROSE 2-4 GM/100ML-% IV SOLN
INTRAVENOUS | Status: AC
  Filled 2022-04-07: qty 100

## 2022-04-07 MED ORDER — ACETAMINOPHEN 500 MG PO TABS: 1000.0000 mg | ORAL_TABLET | Freq: Once | ORAL | Status: AC

## 2022-04-07 MED ORDER — ACETAMINOPHEN 500 MG PO TABS: 500.0000 mg | ORAL_TABLET | Freq: Three times a day (TID) | ORAL | 0 refills | Status: AC

## 2022-04-07 MED ORDER — LACTATED RINGERS IV SOLN: INTRAVENOUS | Status: DC

## 2022-04-07 MED ORDER — LIDOCAINE 2% (20 MG/ML) 5 ML SYRINGE
INTRAMUSCULAR | Status: DC | PRN
  Administered 2022-04-07: 100 mg via INTRAVENOUS

## 2022-04-07 MED ORDER — ACETAMINOPHEN 500 MG PO TABS
ORAL_TABLET | ORAL | Status: AC
  Administered 2022-04-07: 1000 mg via ORAL
  Filled 2022-04-07: qty 2

## 2022-04-07 MED ORDER — GABAPENTIN 300 MG PO CAPS
ORAL_CAPSULE | ORAL | Status: AC
  Administered 2022-04-07: 300 mg via ORAL
  Filled 2022-04-07: qty 1

## 2022-04-07 MED ORDER — VANCOMYCIN HCL 1000 MG IV SOLR
INTRAVENOUS | Status: DC | PRN
  Administered 2022-04-07: 1000 mg

## 2022-04-07 MED ORDER — ONDANSETRON HCL 4 MG/2ML IJ SOLN
INTRAMUSCULAR | Status: DC | PRN
  Administered 2022-04-07: 4 mg via INTRAVENOUS

## 2022-04-07 MED ORDER — 0.9 % SODIUM CHLORIDE (POUR BTL) OPTIME
TOPICAL | Status: DC | PRN
  Administered 2022-04-07: 1000 mL

## 2022-04-07 MED ORDER — ROCURONIUM BROMIDE 10 MG/ML (PF) SYRINGE
PREFILLED_SYRINGE | INTRAVENOUS | Status: AC
Start: 2022-04-07 — End: ?
  Filled 2022-04-07: qty 10

## 2022-04-07 MED ORDER — PHENYLEPHRINE 80 MCG/ML (10ML) SYRINGE FOR IV PUSH (FOR BLOOD PRESSURE SUPPORT)
PREFILLED_SYRINGE | INTRAVENOUS | Status: DC | PRN
  Administered 2022-04-07 (×3): 160 ug via INTRAVENOUS
  Administered 2022-04-07: 80 ug via INTRAVENOUS

## 2022-04-07 MED ORDER — ONDANSETRON HCL 4 MG/2ML IJ SOLN: 4.0000 mg | Freq: Once | INTRAMUSCULAR | Status: DC | PRN

## 2022-04-07 MED ORDER — SODIUM CHLORIDE 0.9 % IR SOLN
Status: DC | PRN
  Administered 2022-04-07: 1000 mL

## 2022-04-07 MED ORDER — OXYCODONE HCL 5 MG PO TABS: 5.0000 mg | ORAL_TABLET | ORAL | 0 refills | Status: DC | PRN

## 2022-04-07 MED ORDER — OXYCODONE HCL 5 MG PO TABS
5.0000 mg | ORAL_TABLET | ORAL | 0 refills | Status: DC | PRN
  Filled 2022-04-07: qty 20, 4d supply, fill #0

## 2022-04-07 MED ORDER — LIDOCAINE 2% (20 MG/ML) 5 ML SYRINGE
INTRAMUSCULAR | Status: AC
  Filled 2022-04-07: qty 5

## 2022-04-07 MED ORDER — MIDAZOLAM HCL 2 MG/2ML IJ SOLN: 1.0000 mg | Freq: Once | INTRAMUSCULAR | Status: AC

## 2022-04-07 MED ORDER — TRANEXAMIC ACID-NACL 1000-0.7 MG/100ML-% IV SOLN
INTRAVENOUS | Status: AC
  Filled 2022-04-07: qty 100

## 2022-04-07 MED ORDER — VANCOMYCIN HCL 1000 MG IV SOLR
INTRAVENOUS | Status: AC
  Filled 2022-04-07: qty 20

## 2022-04-07 MED ORDER — EPHEDRINE 5 MG/ML INJ
INTRAVENOUS | Status: AC
  Filled 2022-04-07: qty 5

## 2022-04-07 MED ORDER — DEXAMETHASONE SODIUM PHOSPHATE 10 MG/ML IJ SOLN
INTRAMUSCULAR | Status: AC
  Filled 2022-04-07: qty 1

## 2022-04-07 MED ORDER — GABAPENTIN 300 MG PO CAPS: 300.0000 mg | ORAL_CAPSULE | Freq: Once | ORAL | Status: AC

## 2022-04-07 MED ORDER — MIDAZOLAM HCL 2 MG/2ML IJ SOLN
INTRAMUSCULAR | Status: AC
  Administered 2022-04-07: 1 mg via INTRAVENOUS
  Filled 2022-04-07: qty 2

## 2022-04-07 MED ORDER — PROPOFOL 10 MG/ML IV BOLUS
INTRAVENOUS | Status: AC
  Filled 2022-04-07: qty 20

## 2022-04-07 MED ORDER — CEFAZOLIN SODIUM-DEXTROSE 2-4 GM/100ML-% IV SOLN
2.0000 g | INTRAVENOUS | Status: AC
  Administered 2022-04-07: 2 g via INTRAVENOUS

## 2022-04-07 MED ORDER — ONDANSETRON HCL 4 MG/2ML IJ SOLN
INTRAMUSCULAR | Status: AC
  Filled 2022-04-07: qty 2

## 2022-04-07 MED ORDER — PROPOFOL 10 MG/ML IV BOLUS
INTRAVENOUS | Status: DC | PRN
  Administered 2022-04-07: 150 mg via INTRAVENOUS

## 2022-04-07 MED ORDER — PHENYLEPHRINE HCL-NACL 20-0.9 MG/250ML-% IV SOLN
INTRAVENOUS | Status: DC | PRN
  Administered 2022-04-07: 50 ug/min via INTRAVENOUS

## 2022-04-07 MED ORDER — ASPIRIN 325 MG PO TBEC
325.0000 mg | DELAYED_RELEASE_TABLET | Freq: Every day | ORAL | 0 refills | Status: DC
  Filled 2022-04-07: qty 30, 30d supply, fill #0

## 2022-04-07 MED ORDER — CHLORHEXIDINE GLUCONATE 0.12 % MT SOLN
OROMUCOSAL | Status: AC
  Administered 2022-04-07: 15 mL via OROMUCOSAL
  Filled 2022-04-07: qty 15

## 2022-04-07 MED ORDER — CHLORHEXIDINE GLUCONATE 0.12 % MT SOLN: 15.0000 mL | Freq: Once | OROMUCOSAL | Status: AC

## 2022-04-07 MED ORDER — FENTANYL CITRATE (PF) 100 MCG/2ML IJ SOLN: 50.0000 ug | Freq: Once | INTRAMUSCULAR | Status: AC

## 2022-04-07 MED ORDER — BUPIVACAINE HCL (PF) 0.25 % IJ SOLN
INTRAMUSCULAR | Status: DC | PRN
  Administered 2022-04-07 (×4): 5 mL via PERINEURAL

## 2022-04-07 MED ORDER — ORAL CARE MOUTH RINSE: 15.0000 mL | Freq: Once | OROMUCOSAL | Status: AC

## 2022-04-07 MED ORDER — CLONIDINE HCL (ANALGESIA) 100 MCG/ML EP SOLN
EPIDURAL | Status: DC | PRN
  Administered 2022-04-07: 100 ug

## 2022-04-07 MED ORDER — SUGAMMADEX SODIUM 200 MG/2ML IV SOLN
INTRAVENOUS | Status: DC | PRN
  Administered 2022-04-07: 100 mg via INTRAVENOUS

## 2022-04-07 SURGICAL SUPPLY — 78 items
AID PSTN UNV HD RSTRNT DISP (MISCELLANEOUS) ×1
APL PRP STRL LF DISP 70% ISPRP (MISCELLANEOUS) ×1
AUG BASEPLATE 15DEG 25 WEDGE (Joint) ×1 IMPLANT
AUGMENT BASEPLATE 15DEG 25 WDG (Joint) IMPLANT
BAG COUNTER SPONGE SURGICOUNT (BAG) ×1 IMPLANT
BAG SPNG CNTER NS LX DISP (BAG) ×1
BETADINE 5% OPHTHALMIC (OPHTHALMIC) ×1 IMPLANT
BIT DRILL 3.2 PERIPHERAL SCREW (BIT) IMPLANT
BLADE SAW SAG 29X58X.64 (BLADE) ×1 IMPLANT
BLADE SURG 15 STRL LF DISP TIS (BLADE) ×1 IMPLANT
BLADE SURG 15 STRL SS (BLADE) ×1
BSPLAT GLND 15D 25 FULL WDG (Joint) ×1 IMPLANT
CHLORAPREP W/TINT 26 (MISCELLANEOUS) ×1 IMPLANT
CLSR STERI-STRIP ANTIMIC 1/2X4 (GAUZE/BANDAGES/DRESSINGS) IMPLANT
COOLER ICEMAN CLASSIC (MISCELLANEOUS) ×1 IMPLANT
COVER SURGICAL LIGHT HANDLE (MISCELLANEOUS) ×1 IMPLANT
DRAPE IMP U-DRAPE 54X76 (DRAPES) ×1 IMPLANT
DRAPE INCISE IOBAN 66X45 STRL (DRAPES) ×1 IMPLANT
DRAPE ORTHO SPLIT 77X108 STRL (DRAPES) ×1
DRAPE STERI 35X30 U-POUCH (DRAPES) ×1 IMPLANT
DRAPE SURG ORHT 6 SPLT 77X108 (DRAPES) ×1 IMPLANT
DRAPE U-SHAPE 47X51 STRL (DRAPES) ×2 IMPLANT
DRSG AQUACEL AG ADV 3.5X10 (GAUZE/BANDAGES/DRESSINGS) ×1 IMPLANT
ELECT BLADE 4.0 EZ CLEAN MEGAD (MISCELLANEOUS) ×1
ELECT REM PT RETURN 9FT ADLT (ELECTROSURGICAL) ×1
ELECTRODE BLDE 4.0 EZ CLN MEGD (MISCELLANEOUS) ×1 IMPLANT
ELECTRODE REM PT RTRN 9FT ADLT (ELECTROSURGICAL) ×1 IMPLANT
GLENOSPHERE REV SHOULDER 36 (Joint) IMPLANT
GLOVE BIOGEL PI IND STRL 6.5 (GLOVE) ×1 IMPLANT
GLOVE BIOGEL PI IND STRL 8 (GLOVE) ×2 IMPLANT
GLOVE BIOGEL PI INDICATOR 6.5 (GLOVE) ×1
GLOVE BIOGEL PI INDICATOR 8 (GLOVE) ×2
GLOVE ECLIPSE 6.0 STRL STRAW (GLOVE) ×1 IMPLANT
GLOVE ECLIPSE 8.0 STRL XLNG CF (GLOVE) ×1 IMPLANT
GOWN STRL REUS W/ TWL LRG LVL3 (GOWN DISPOSABLE) ×2 IMPLANT
GOWN STRL REUS W/TWL LRG LVL3 (GOWN DISPOSABLE) ×2
GUIDE PIN 3X75 SHOULDER (PIN) ×2
GUIDEWIRE GLENOID 2.5X220 (WIRE) IMPLANT
HANDPIECE INTERPULSE COAX TIP (DISPOSABLE) ×1
INSERT REVERSED HUMERAL SIZE 1 (Orthopedic Implant) IMPLANT
KIT BASIN OR (CUSTOM PROCEDURE TRAY) ×1 IMPLANT
KIT STABILIZATION SHOULDER (MISCELLANEOUS) IMPLANT
KIT TURNOVER KIT B (KITS) ×1 IMPLANT
MANIFOLD NEPTUNE II (INSTRUMENTS) ×1 IMPLANT
NDL HYPO 25GX1X1/2 BEV (NEEDLE) ×1 IMPLANT
NEEDLE HYPO 25GX1X1/2 BEV (NEEDLE) ×1 IMPLANT
NS IRRIG 1000ML POUR BTL (IV SOLUTION) ×1 IMPLANT
OPHTHALMIC BETADINE 5% (OPHTHALMIC) ×1
PACK SHOULDER (CUSTOM PROCEDURE TRAY) ×1 IMPLANT
PACK UNIVERSAL I (CUSTOM PROCEDURE TRAY) ×1 IMPLANT
PAD ARMBOARD 7.5X6 YLW CONV (MISCELLANEOUS) ×2 IMPLANT
PAD COLD SHLDR WRAP-ON (PAD) ×1 IMPLANT
PIN GUIDE 3X75 SHOULDER (PIN) IMPLANT
RESTRAINT HEAD UNIVERSAL NS (MISCELLANEOUS) ×1 IMPLANT
SCREW 5.5X14 (Screw) IMPLANT
SCREW 5.5X22 (Screw) IMPLANT
SCREW 5.5X26 (Screw) IMPLANT
SCREW BONE INTRNL SM 7 (Screw) IMPLANT
SET HNDPC FAN SPRY TIP SCT (DISPOSABLE) ×1 IMPLANT
SLEEVE ARM SUSPENSION SYSTEM (MISCELLANEOUS) IMPLANT
SLING ARM IMMOBILIZER LRG (SOFTGOODS) ×1 IMPLANT
SPONGE T-LAP 18X18 ~~LOC~~+RFID (SPONGE) ×1 IMPLANT
STAPLER VISISTAT 35W (STAPLE) ×1 IMPLANT
STEM HUMERAL PLUS LONG 1+ (Orthopedic Implant) IMPLANT
SUCTION FRAZIER HANDLE 10FR (MISCELLANEOUS) ×1
SUCTION TUBE FRAZIER 10FR DISP (MISCELLANEOUS) ×1 IMPLANT
SUT MNCRL AB 3-0 PS2 18 (SUTURE) IMPLANT
SUT VIC AB 0 CT1 27 (SUTURE) ×1
SUT VIC AB 0 CT1 27XBRD ANBCTR (SUTURE) ×1 IMPLANT
SUT VIC AB 2-0 CT1 27 (SUTURE) ×2
SUT VIC AB 2-0 CT1 TAPERPNT 27 (SUTURE) IMPLANT
SYR CONTROL 10ML LL (SYRINGE) ×1 IMPLANT
TOWEL GREEN STERILE (TOWEL DISPOSABLE) ×1 IMPLANT
TOWER CARTRIDGE SMART MIX (DISPOSABLE) ×1 IMPLANT
TRAY FOLEY MTR SLVR 16FR STAT (SET/KITS/TRAYS/PACK) ×1 IMPLANT
TUBE CONNECTING 12X1/4 (SUCTIONS) IMPLANT
WATER STERILE IRR 1000ML POUR (IV SOLUTION) ×1 IMPLANT
YANKAUER SUCT BULB TIP NO VENT (SUCTIONS) IMPLANT

## 2022-04-07 NOTE — Interval H&P Note (Signed)
History and Physical Interval Note:  04/07/2022 12:26 PM  [name]  has presented today for surgery, with the diagnosis of RIGHT IRREPARABLE ROTATOR CUFF ARTHROPLASTY.  The various methods of treatment have been discussed with the patient and family. After consideration of risks, benefits and other options for treatment, the patient has consented to  Procedure(s): RIGHT REVERSE SHOULDER ARTHROPLASTY (Right) as a surgical intervention.  The patient's history has been reviewed, patient examined, no change in status, stable for surgery.  I have reviewed the patient's chart and labs.  Questions were answered to the patient's satisfaction.     Vanetta Mulders

## 2022-04-07 NOTE — Op Note (Signed)
Date of Surgery: 04/07/2022  INDICATIONS: Ms. Coatney is a 76 y.o.-year-old female with right shoulder rotator cuff arthropathy.  The risk and benefits of the procedure were discussed in detail and documented in the pre-operative evaluation.   PREOPERATIVE DIAGNOSIS: 1.  Right shoulder rotator cuff arthropathy  POSTOPERATIVE DIAGNOSIS: Same.  PROCEDURE: 1.  Right shoulder reverse shoulder replacement 2.  Right shoulder biceps tenodesis  SURGEON: Yevonne Pax MD  ASSISTANT: Raynelle Fanning, ATC  ANESTHESIA:  general plus interscalene nerve block  IV FLUIDS AND URINE: See anesthesia record.  ANTIBIOTICS: Ancef  ESTIMATED BLOOD LOSS: 10 mL.  IMPLANTS:  Implant Name Type Inv. Item Serial No. Manufacturer Lot No. LRB No. Used Action  SCREW BONE INTRNL SM 7 - [id number] Screw SCREW BONE INTRNL SM 7 [id number] [employer]  Right 1 Implanted  AUG BASEPLATE 15DEG 25 WEDGE - [id number] Joint AUG BASEPLATE 15DEG 25 WEDGE [id number] [employer]  Right 1 Implanted  SCREW 5.5X26 - [id number] Screw SCREW 5.5X26  [employer]  Right 2 Implanted  SCREW 5.5X22 - [id number] Screw SCREW 5.5X22  [employer]  Right 1 Implanted  SCREW 5.5X14 - [id number] Screw SCREW 5.5X14  [employer]  Right 1 Implanted  GLENOSPHERE REV SHOULDER 36 - [id number] Joint GLENOSPHERE REV SHOULDER 36 [id number] [employer]  Right 1 Implanted  INSERT REVERSED HUMERAL SIZE 1 - [id number] Orthopedic Implant INSERT REVERSED HUMERAL SIZE 1 [id number] [employer]  Right 1 Implanted  STEM HUMERAL PLUS LONG 1+ - [id number] Orthopedic Implant STEM HUMERAL PLUS LONG 1+ [id number] [employer]  Right 1 Implanted    DRAINS: None  CULTURES: None  COMPLICATIONS: none  DESCRIPTION OF PROCEDURE:  Patient was identified in the preoperative holding area.  Anesthesia performed an interscalene nerve block after universal timeout was performed with nursing.  Ancef was given 1 hour prior to skin incision.    The surgical site was scrubbed with  a chlorhexidine scrub brush and alcohol.  The patient was then prepped with chlorhexidine skin prep.  The patient was subsequently taken back to the operating room.  Anesthesia was induced.  He was transferred to the beachchair position.  All bony prominences were padded.  Final timeout was again performed.     The bony landmarks of the shoulder were marked with a marking pen. A delto-pectoral incision was made, extending up approximately 5 inches. The wound with then irrigated with dilute betadine. Cephalic vein was identified, and an protected. This was retracted medially. Subdeltoid and subpectoral lesions were released. Neurovascular structures were carefully protected. The Gelpi retractor was used to retract the deltoid and pectoralis major. A 1 cm release was performed on the upper pectoralis.   The deltoid was retracted laterally with a Brown humeral retractor.  The conjoined tendon was identified. The cleido-pectoral fascia was excised.  The axillary nerve was palpated and carefully protected throughout the procedure. The biceps tendon was found and tenodesed to the upper pec with # 2 FiberWire.  Proximally the biceps tendon was removed up to the joint.  The bicipital groove was used for a landmark to establish rotator cuff interval. The subscap was tagged with a #2 FiberWire.  At this point the subscap was peeled off from the lesser tuberosity with care to avoid dissection distally in order to protect the axillary nerve.  Once the joint was exposed the proximal humerus was delivered with external rotation and extension of the arm. The humerus was prepped initially by performing a humeral neck cut. This was done  with the guide using 20 degrees of retroversion as a reference.  The head portion was removed.  A medullary sounding reamer was then used.  We subsequently placed our guidewire through the center of the humeral head using the reference guide.  This was a size 3.  Metaphyseal reamer was then  used.  Finally the size 3 broach was malleted into place with excellent purchase.  A tonsil clamp was used to attempt to pull this out with very good purchase   Attention was then turned to the glenoid.  Posteriorly a large Darach retractor was used.  A 360 Degree release of the subscapularis and glenoid were done. The capsule was released from the humerus.    Glenoid retractors were placed posteriorly, superiorly behind the biceps tendon and anteriorly on the glenoid neck. A 360-degree release of the capsule was performed with cautery.  The triceps was released off the inferior tubercle of the glenoid. The axillary nerve was carefully protected with the surgeon's index finger, retracting it and using cautery.   A guidepin was placed through the glenoid guide. The guidepin was drilled until it exited the cortex. The guidepin was over drilled. Next, the glenoid was prepared with the reamer  down to cortical bone.  The central peg hole was totally within the scapular neck tested with the probe.  The baseplate was then placed screwed securely with good purchase in position and then secured with 4 screws. In each case, they were drilled and measured and the appropriate length screw placed with excellent rigid fixation of the baseplate.    A +3 liner was then used with the appropriate broach.  This was brought to just the level of the reduction but not completely reduced.  A +3 final poly was selected and impacted.    Appropriate tension was noted on the conjoined tendon and deltoid muscle.  Extension was stable, external and internal rotation as well.  The subscap was pulled over but as this was not able to reach comfortably decision was made not to repair in order to prevent limited in external rotation.  The wound was then irrigated. Vancomycin powder was placed in the wound again for infection prevention.   The wound was then closed in layers with 0 Vicryl interrupted in the deep subcu followed by 2-0  Vicryl in the superficial subcu and staples for skin.  An Aquacel dressing was applied as well as an Naval architect.  A shoulder immobilizer was applied.        POSTOPERATIVE PLAN: She will be seen in physical therapy postop. I will see her in 2 weeks for a wound check. I will plan to place her on aspirin for DVT prophylaxis.  Yevonne Pax, MD 2:35 PM

## 2022-04-07 NOTE — Brief Op Note (Signed)
   Brief Op Note  Date of Surgery: 04/07/2022  Preoperative Diagnosis: RIGHT IRREPARABLE ROTATOR CUFF ARTHROPLASTY  Postoperative Diagnosis: same  Procedure: Procedure(s): RIGHT SHOULDER REVERSE SHOULDER ARTHROPLASTY  Implants: Implant Name Type Inv. Item Serial No. Manufacturer Lot No. LRB No. Used Action  SCREW BONE INTRNL SM 7 - [id number] Screw SCREW BONE INTRNL SM 7 [id number] [employer]  Right 1 Implanted  AUG BASEPLATE 15DEG 25 WEDGE - [id number] Joint AUG BASEPLATE 15DEG 25 WEDGE [id number] [employer]  Right 1 Implanted  SCREW 5.5X26 - [id number] Screw SCREW 5.5X26  [employer]  Right 2 Implanted  SCREW 5.5X22 - [id number] Screw SCREW 5.5X22  [employer]  Right 1 Implanted  SCREW 5.5X14 - [id number] Screw SCREW 5.5X14  [employer]  Right 1 Implanted  GLENOSPHERE REV SHOULDER 36 - [id number] Joint GLENOSPHERE REV SHOULDER 36 [id number] [employer]  Right 1 Implanted  INSERT REVERSED HUMERAL SIZE 1 - [id number] Orthopedic Implant INSERT REVERSED HUMERAL SIZE 1 [id number] [employer]  Right 1 Implanted  STEM HUMERAL PLUS LONG 1+ - [id number] Orthopedic Implant STEM HUMERAL PLUS LONG 1+ [id number] [employer]  Right 1 Implanted    Surgeons: Surgeon(s): Vanetta Mulders, MD  Anesthesia: General    Estimated Blood Loss: See anesthesia record  Complications: None  Condition to PACU: Stable  Yevonne Pax, MD 04/07/2022 2:35 PM

## 2022-04-07 NOTE — Anesthesia Procedure Notes (Signed)
Anesthesia Regional Block: Interscalene brachial plexus block   Pre-Anesthetic Checklist: , timeout performed,  Correct Patient, Correct Site, Correct Laterality,  Correct Procedure, Correct Position, site marked,  Risks and benefits discussed,  Surgical consent,  Pre-op evaluation,  At surgeon's request and post-op pain management  Laterality: Right and Upper  Prep: chloraprep       Needles:  Injection technique: Single-shot  Needle Type: Echogenic Stimulator Needle     Needle Length: 9cm  Needle Gauge: 20   Needle insertion depth: 0.5 cm   Additional Needles:   Procedures:,,,, ultrasound used (permanent image in chart),,    Narrative:  Start time: 04/07/2022 12:14 PM End time: 04/07/2022 12:23 PM Injection made incrementally with aspirations every 5 mL.  Performed by: Personally  Anesthesiologist: Lyn Hollingshead, MD

## 2022-04-07 NOTE — Anesthesia Postprocedure Evaluation (Signed)
Anesthesia Post Note  Patient: [name]  Procedure(s) Performed: RIGHT SHOULDER REVERSE SHOULDER ARTHROPLASTY (Right: Shoulder)     Patient location during evaluation: PACU Anesthesia Type: General Level of consciousness: awake Pain management: pain level controlled Vital Signs Assessment: post-procedure vital signs reviewed and stable Respiratory status: spontaneous breathing Cardiovascular status: stable Postop Assessment: no apparent nausea or vomiting Anesthetic complications: no   No notable events documented.  Last Vitals:  Vitals:   04/07/22 1510 04/07/22 1525  BP: 117/68 (!) 105/56  Pulse: (!) 57 65  Resp: 19 20  Temp:    SpO2: 96% 94%    Last Pain:  Vitals:   04/07/22 1525  TempSrc:   PainSc: 0-No pain                 Huston Foley

## 2022-04-07 NOTE — Transfer of Care (Signed)
Immediate Anesthesia Transfer of Care Note  Patient: [name]  Procedure(s) Performed: RIGHT SHOULDER REVERSE SHOULDER ARTHROPLASTY (Right: Shoulder)  Patient Location: PACU  Anesthesia Type:General  Level of Consciousness: oriented, drowsy and patient cooperative  Airway & Oxygen Therapy: Patient Spontanous Breathing and Patient connected to nasal cannula oxygen  Post-op Assessment: Report given to RN and Post -op Vital signs reviewed and stable  Post vital signs: Reviewed  Last Vitals:  Vitals Value Taken Time  BP 124/63 04/07/22 1452  Temp    Pulse 63 04/07/22 1455  Resp 19 04/07/22 1455  SpO2 96 % 04/07/22 1455  Vitals shown include unvalidated device data.  Last Pain:  Vitals:   04/07/22 1008  TempSrc:   PainSc: 8          Complications: No notable events documented.

## 2022-04-07 NOTE — Discharge Instructions (Signed)
     Discharge Instructions    Attending Surgeon: Vanetta Mulders, MD Office Phone Number: [phone]   Diagnosis and Procedures:    Surgeries Performed: Right shoulder reverse arthroplasty  Discharge Plan:    Diet: Resume usual diet. Begin with light or bland foods.  Drink plenty of fluids.  Activity:  Keep sling and dressing in place until your follow up visit in Physical Therapy You are advised to go home directly from the hospital or surgical center. Restrict your activities.  GENERAL INSTRUCTIONS: 1.  Keep your surgical site elevated above your heart for at least 5-7 days or longer to prevent swelling. This will improve your comfort and your overall recovery following surgery.     2. Please call Dr. Eddie Dibbles office at [phone] with questions Monday-Friday during business hours. If no one answers, please leave a message and someone should get back to the patient within 24 hours. For emergencies please call 911 or proceed to the emergency room.   3. Patient to notify surgical team if experiences any of the following: Bowel/Bladder dysfunction, uncontrolled pain, nerve/muscle weakness, incision with increased drainage or redness, nausea/vomiting and Fever greater than 101.0 F.  Be alert for signs of infection including redness, streaking, odor, fever or chills. Be alert for excessive pain or bleeding and notify your surgeon immediately.  WOUND INSTRUCTIONS:   Leave your dressing/cast/splint in place until your post operative visit.  Keep it clean and dry.  Always keep the incision clean and dry until the staples/sutures are removed. If there is no drainage from the incision you should keep it open to air. If there is drainage from the incision you must keep it covered at all times until the drainage stops  Do not soak in a bath tub, hot tub, pool, lake or other body of water until 21 days after your surgery and your incision is completely dry and healed.  If you have  removable sutures (or staples) they must be removed 10-14 days (unless otherwise instructed) from the day of your surgery.     1)  Elevate the extremity as much as possible.  2)  Keep the dressing clean and dry.  3)  Please call us if the dressing becomes wet or dirty.  4)  If you are experiencing worsening pain or worsening swelling, please call.     MEDICATIONS: Resume all previous home medications at the previous prescribed dose and frequency unless otherwise noted Start taking the  pain medications on an as-needed basis as prescribed  Please taper down pain medication over the next week following surgery.  Ideally you should not require a refill of any narcotic pain medication.  Take pain medication with food to minimize nausea. In addition to the prescribed pain medication, you may take over-the-counter pain relievers such as Tylenol.  Do NOT take additional tylenol if your pain medication already has tylenol in it.  Aspirin '325mg'$  daily for four weeks.      FOLLOWUP INSTRUCTIONS: 1. Follow up at the Physical Therapy Clinic 3-4 days following surgery. This appointment should be scheduled unless other arrangements have been made.The Physical Therapy scheduling number is [phone] if an appointment has not already been arranged.  2. Contact Dr. Eddie Dibbles office during office hours at [phone] or the practice after hours line at [phone] for non-emergencies. For medical emergencies call 911.   Discharge Location: Home

## 2022-04-07 NOTE — Anesthesia Procedure Notes (Signed)
Procedure Name: Intubation Date/Time: 04/07/2022 12:46 PM  Performed by: Jenne Campus, CRNAPre-anesthesia Checklist: Patient identified, Emergency Drugs available, Suction available and Patient being monitored Patient Re-evaluated:Patient Re-evaluated prior to induction Oxygen Delivery Method: Circle System Utilized Preoxygenation: Pre-oxygenation with 100% oxygen Induction Type: IV induction Ventilation: Mask ventilation without difficulty Laryngoscope Size: Miller and 2 Grade View: Grade I Tube type: Oral Tube size: 7.0 mm Number of attempts: 1 Airway Equipment and Method: Stylet and Oral airway Placement Confirmation: ETT inserted through vocal cords under direct vision, positive ETCO2 and breath sounds checked- equal and bilateral Secured at: 21 cm Tube secured with: Tape Dental Injury: Teeth and Oropharynx as per pre-operative assessment

## 2022-04-07 NOTE — H&P (Signed)
Chief Complaint: Right shoulder pain        History of Present Illness:      [name] is a 76 y.o. female presents today with right shoulder pain as a referral from my partner Dr. Tempie Donning.  She states that she has had right shoulder pain on and off for the last several years.  She did have several injections by Dr. Sharol Given 1 year prior which gave her transient relief but is subsequently worn off.  The second injection did not give lasting relief.  She has previously underwent carpal tunnel release on the right side with Dr. Tempie Donning.  She is right-hand dominant.  She did undergo a course of physical therapy the previous year although this did not significantly help pain and overhead activity.  She is experiencing pain with any type of overhead activity.  Does have a very distant history of rotator cuff repair with Dr. Sharol Given many years prior.  She is not able to specifically remember what year it was although this was several years ago.  She states that the pain feels similar to this.  This is keeping her awake at night.  She has trialed lidocaine patches as well as over-the-counter creams with little relief.  She does smoke cigarettes.  I specifically talked with her and counseled her on this.  I will plan to make a referral to smoking cessation regarding this as well.       Surgical History:   Distant history of right shoulder rotator cuff repair with Dr. Sharol Given several years prior   PMH/PSH/Family History/Social History/Meds/Allergies:         Past Medical History:  Diagnosis Date   Anemia     Anginal pain ([place])      last cp was last year   Anxiety     Arthritis     Blood transfusion     Chest pain 02/04/2016   Coronary artery disease     Coronary artery spasm, wth continued episodes of chest pain.  09/26/2013   Coronary vasospasm (HCC)     Diabetes mellitus without complication (HCC)      type II   GERD (gastroesophageal reflux disease)     Headache     Hiatal  hernia     Hypercholesteremia     Hypertension     NSTEMI (non-ST elevated myocardial infarction) ([place]) not sure   NSVT (nonsustained ventricular tachycardia) ([place]) 09/26/2013   Panic attack           Past Surgical History:  Procedure Laterality Date   ABDOMINAL HYSTERECTOMY        PARTIAL HYSTERECTOMY   ACDF N/A      C5-6 ACDF Dr. Sherwood Gambler   breast        right, tumor benign   BREAST EXCISIONAL BIOPSY Right      1961 (age 45) benign   CARDIAC CATHETERIZATION   2014   CARDIAC CATHETERIZATION   2012   CARDIAC CATHETERIZATION N/A 07/22/2016    Procedure: Left Heart Cath and Coronary Angiography;  Surgeon: Belva Crome, MD;  Location: [place] [facility];  Service: Cardiovascular;  Laterality: N/A;   cardiac stent   2012    to RCA   CHOLECYSTECTOMY N/A 10/02/2017    Procedure: LAPAROSCOPIC CHOLECYSTECTOMY;  Surgeon: Ralene Ok, MD;  Location: [facility];  Service: General;  Laterality: N/A;   CORONARY ANGIOPLASTY       ESOPHAGEAL MANOMETRY N/A 10/29/2016    Procedure: ESOPHAGEAL MANOMETRY (  EM);  Surgeon: Garlan Fair, MD;  Location: [facility];  Service: Endoscopy;  Laterality: N/A;   ESOPHAGOGASTRODUODENOSCOPY (EGD) WITH PROPOFOL N/A 10/28/2016    Procedure: ESOPHAGOGASTRODUODENOSCOPY (EGD) WITH PROPOFOL;  Surgeon: Garlan Fair, MD;  Location: [facility];  Service: Endoscopy;  Laterality: N/A;   EXCISION OF SKIN TAG N/A 07/29/2017    Procedure: EXCISION OF PERIANAL  SKIN TAG;  Surgeon: Ralene Ok, MD;  Location: [facility];  Service: General;  Laterality: N/A;   KNEE SURGERY   left   left ear surgery        for bad cut   LEFT HEART CATHETERIZATION WITH CORONARY ANGIOGRAM N/A 02/22/2013    Procedure: LEFT HEART CATHETERIZATION WITH CORONARY ANGIOGRAM;  Surgeon: [name], MD;  Location: [facility];  Service: Cardiovascular;  Laterality: N/A;   SHOULDER SURGERY Bilateral      rotator cuff    Social History         Socioeconomic History   Marital status:  Married      Spouse name: [name]   Number of children: 0   Years of education: Not on file   Highest education level: Not on file  Occupational History   Not on file  Tobacco Use   Smoking status: Some Days      Packs/day: 0.50      Years: 40.00      Total pack years: 20.00      Types: Cigarettes      Last attempt to quit: 01/10/2016      Years since quitting: 6.1   Smokeless tobacco: Never   Tobacco comments:      occasional   Vaping Use   Vaping Use: Never used  Substance and Sexual Activity   Alcohol use: Yes      Alcohol/week: 1.0 standard drink of alcohol      Types: 1 Cans of beer per week      Comment: occasional beer   Drug use: No   Sexual activity: Never  Other Topics Concern   Not on file  Social History Narrative    Lives with husband. Ambulates independently.    Social Determinants of Health    Financial Resource Strain: Not on file  Food Insecurity: Not on file  Transportation Needs: Not on file  Physical Activity: Not on file  Stress: Not on file  Social Connections: Not on file         Family History  Problem Relation Age of Onset   Heart failure Mother     Stroke Sister     Heart attack Other      No Known Allergies       Current Outpatient Medications  Medication Sig Dispense Refill   ACCU-CHEK GUIDE test strip USE TO CHECK BLOOD SUGAR DAILY       Accu-Chek Softclix Lancets lancets daily.       acetaminophen (TYLENOL) 500 MG tablet Take 500 mg by mouth daily as needed for moderate pain or headache.       amLODipine (NORVASC) 2.5 MG tablet Take 1 tablet (2.5 mg total) by mouth daily. 30 tablet 1   aspirin EC 81 MG tablet Take 81 mg by mouth daily.       buPROPion (ZYBAN) 150 MG 12 hr tablet Take 150 mg by mouth every morning.       dapagliflozin propanediol (FARXIGA) 10 MG TABS tablet Take 1 tablet (10 mg total) by mouth daily before breakfast. 30 tablet 11  metFORMIN (GLUCOPHAGE-XR) 500 MG 24 hr tablet Take 500 mg by mouth 2 (two) times  daily.       metoprolol tartrate (LOPRESSOR) 100 MG tablet Take one tablet by mouth 2 hours prior to CT 1 tablet 0   nitroGLYCERIN (NITROSTAT) 0.4 MG SL tablet PLACE 1 TABLET UNDER TONGUE AS NEEDED FOR CHEST PAIN EVERY 5 MINUTES UP TO 3 DOSES THEN SEEK MEDICAL ATTENTION. Please make overdue appt. 1st attempt 25 tablet 0   pantoprazole (PROTONIX) 40 MG tablet Take 1 tablet (40 mg total) by mouth 2 (two) times daily. 60 tablet 2   predniSONE (DELTASONE) 50 MG tablet Take one tablet by mouth once daily for 5 days. 5 tablet 0   pregabalin (LYRICA) 50 MG capsule Take 1 cap by mouth at night for 7 nights then take 1 cap by mouth in the morning and 1 at night. 30 capsule 1   ranitidine (ZANTAC) 150 MG tablet Take 150 mg by mouth 2 (two) times daily.        ranolazine (RANEXA) 500 MG 12 hr tablet TAKE 1 TABLET(500 MG) BY MOUTH TWICE DAILY (Patient taking differently: Take 500 mg by mouth 2 (two) times daily.) 60 tablet 11   rosuvastatin (CRESTOR) 5 MG tablet Take 1 tablet (5 mg total) by mouth daily at 6 PM. (Patient taking differently: Take 5 mg by mouth daily.) 30 tablet 1   senna-docusate (SENOKOT-S) 8.6-50 MG tablet Take 1 tablet by mouth at bedtime as needed for mild constipation or moderate constipation. (Patient taking differently: Take 1 tablet by mouth daily as needed for mild constipation or moderate constipation.) 20 tablet 0   temazepam (RESTORIL) 15 MG capsule Take 15 mg by mouth daily as needed for sleep.       traZODone (DESYREL) 50 MG tablet Take 50 mg by mouth as needed for sleep.        No current facility-administered medications for this visit.    Imaging Results (Last 48 hours)  No results found.     Review of Systems:   A ROS was performed including pertinent positives and negatives as documented in the HPI.   Physical Exam :   Constitutional: NAD and appears stated age Neurological: Alert and oriented Psych: Appropriate affect and cooperative There were no vitals taken for  this visit.    Comprehensive Musculoskeletal Exam:     Musculoskeletal Exam      Inspection Right Left  Skin No atrophy or winging No atrophy or winging  Palpation      Tenderness Glenohumeral, lateral deltoid None  Range of Motion      Flexion (passive) 150 170  Flexion (active) 140 170  Abduction 140 170  ER at the side 30 70  Can reach behind back to Back pocket T12  Strength        4-5 supra and infraspinatus, negative belly press Full  Special Tests      Pseudoparalytic No No  Neurologic      Fires PIN, radial, median, ulnar, musculocutaneous, axillary, suprascapular, long thoracic, and spinal accessory innervated muscles. No abnormal sensibility  Vascular/Lymphatic      Radial Pulse 2+ 2+  Cervical Exam      Patient has symmetric cervical range of motion with negative Spurling's test.  Special Test:         Imaging:   Xray (right shoulder 3 views): There is evidence of glenohumeral degenerative findings as well as rotator cuff arthropathy with superior migration of the humeral head  MRI (right shoulder): Previous rotator cuff tear with significant atrophy of the supraspinatus muscle   I personally reviewed and interpreted the radiographs.     Assessment:   76 y.o. female right-hand-dominant female presents with right shoulder pain in the setting of rotator cuff arthropathy status post previous rotator cuff repair.  At today's visit I described that she has trialed many forms of conservative management including physical therapy injections and over-the-counter creams.  She continues to have significant limitations with overhead activity.  To that effect I do believe that this is a result of the superior migration of her humeral head.  We did discuss additional treatment options.  Specifically I do believe that she would be a candidate for reverse shoulder arthroplasty given the fact that she has failed many forms of nonoperative management.  She is currently diabetic  although she is not taking any insulin.  I described that this in addition to smoking both can lead to a higher risk of complication.  I specifically counseled her on smoking cessation and we will plan to make a referral to our smoking cessation team.  That being said she continues to be quite disabled and is hoping to undergo any type of intervention that would get her a lasting and durable shoulder with overhead activity.  To that effect after further discussion she has ultimately elected for right shoulder reverse shoulder arthroplasty   Plan :     -Plan for right shoulder reverse shoulder arthroplasty       After a lengthy discussion of treatment options, including risks, benefits, alternatives, complications of surgical and nonsurgical conservative options, the patient elected surgical repair.    The patient  is aware of the material risks  and complications including, but not limited to injury to adjacent structures, neurovascular injury, infection, numbness, bleeding, implant failure, thermal burns, stiffness, persistent pain, failure to heal, disease transmission from allograft, need for further surgery, dislocation, anesthetic risks, blood clots, risks of death,and others. The probabilities of surgical success and failure discussed with patient given their particular co-morbidities.The time and nature of expected rehabilitation and recovery was discussed.The patient's questions were all answered preoperatively.  No barriers to understanding were noted. I explained the natural history of the disease process and Rx rationale.  I explained to the patient what I considered to be reasonable expectations given their personal situation.  The final treatment plan was arrived at through a shared patient decision making process model.           I personally saw and evaluated the patient, and participated in the management and treatment plan.   Vanetta Mulders, MD Attending Physician, Orthopedic  Surgery   This document was dictated using Dragon voice recognition software. A reasonable attempt at proof reading has been made to minimize errors.

## 2022-04-08 ENCOUNTER — Other Ambulatory Visit (HOSPITAL_BASED_OUTPATIENT_CLINIC_OR_DEPARTMENT_OTHER): Payer: Self-pay | Admitting: Orthopaedic Surgery

## 2022-04-08 MED ORDER — ONDANSETRON 8 MG PO TBDP: 8.0000 mg | ORAL_TABLET | Freq: Three times a day (TID) | ORAL | 0 refills | Status: DC | PRN

## 2022-04-08 MED ORDER — HYDROCODONE-ACETAMINOPHEN 5-325 MG PO TABS: 1.0000 | ORAL_TABLET | Freq: Four times a day (QID) | ORAL | 0 refills | Status: DC | PRN

## 2022-04-09 ENCOUNTER — Telehealth (HOSPITAL_BASED_OUTPATIENT_CLINIC_OR_DEPARTMENT_OTHER): Payer: Self-pay | Admitting: Orthopaedic Surgery

## 2022-04-09 ENCOUNTER — Ambulatory Visit (HOSPITAL_BASED_OUTPATIENT_CLINIC_OR_DEPARTMENT_OTHER): Payer: Self-pay | Admitting: Orthopaedic Surgery

## 2022-04-09 ENCOUNTER — Encounter (HOSPITAL_COMMUNITY): Payer: Self-pay | Admitting: Orthopaedic Surgery

## 2022-04-09 ENCOUNTER — Other Ambulatory Visit (HOSPITAL_BASED_OUTPATIENT_CLINIC_OR_DEPARTMENT_OTHER): Payer: Self-pay | Admitting: Orthopaedic Surgery

## 2022-04-09 MED ORDER — METHOCARBAMOL 500 MG PO TABS: 500.0000 mg | ORAL_TABLET | Freq: Four times a day (QID) | ORAL | 3 refills | Status: DC

## 2022-04-09 NOTE — Telephone Encounter (Signed)
Spoke to patient and informed her medication was sent to [facility]

## 2022-04-10 ENCOUNTER — Encounter (HOSPITAL_BASED_OUTPATIENT_CLINIC_OR_DEPARTMENT_OTHER): Payer: Self-pay

## 2022-04-10 ENCOUNTER — Ambulatory Visit (HOSPITAL_BASED_OUTPATIENT_CLINIC_OR_DEPARTMENT_OTHER): Payer: Medicare Other | Attending: Orthopaedic Surgery | Admitting: Physical Therapy

## 2022-04-10 ENCOUNTER — Telehealth (HOSPITAL_BASED_OUTPATIENT_CLINIC_OR_DEPARTMENT_OTHER): Payer: Self-pay | Admitting: Physical Therapy

## 2022-04-10 NOTE — Telephone Encounter (Signed)
LVM regarding no show for evaluation today and requested call back.  Huda Petrey C. Jentri Aye PT, DPT 04/10/22 4:40 PM

## 2022-04-11 ENCOUNTER — Encounter (HOSPITAL_BASED_OUTPATIENT_CLINIC_OR_DEPARTMENT_OTHER): Payer: Self-pay | Admitting: Physical Therapy

## 2022-04-11 ENCOUNTER — Other Ambulatory Visit: Payer: Self-pay

## 2022-04-11 ENCOUNTER — Ambulatory Visit (HOSPITAL_BASED_OUTPATIENT_CLINIC_OR_DEPARTMENT_OTHER): Payer: Medicare Other | Attending: Orthopaedic Surgery | Admitting: Physical Therapy

## 2022-04-11 DIAGNOSIS — M25511 Pain in right shoulder: Secondary | ICD-10-CM | POA: Diagnosis not present

## 2022-04-11 DIAGNOSIS — M6281 Muscle weakness (generalized): Secondary | ICD-10-CM | POA: Insufficient documentation

## 2022-04-11 DIAGNOSIS — M25531 Pain in right wrist: Secondary | ICD-10-CM | POA: Diagnosis not present

## 2022-04-11 DIAGNOSIS — M25611 Stiffness of right shoulder, not elsewhere classified: Secondary | ICD-10-CM | POA: Diagnosis not present

## 2022-04-11 DIAGNOSIS — M12811 Other specific arthropathies, not elsewhere classified, right shoulder: Secondary | ICD-10-CM | POA: Insufficient documentation

## 2022-04-11 DIAGNOSIS — R293 Abnormal posture: Secondary | ICD-10-CM | POA: Diagnosis not present

## 2022-04-11 NOTE — Therapy (Signed)
OUTPATIENT PHYSICAL THERAPY SHOULDER EVALUATION   Patient Name: [name] MRN: [id number] DOB:30-Apr-1946, 76 y.o., female Today's Date: 04/11/2022   PT End of Session - 04/11/22 1253     Visit Number 1    Number of Visits 22    Date for PT Re-Evaluation 06/20/22    Authorization Type [employer] MCR    Progress Note Due on Visit 10    PT Start Time 1250    PT Stop Time 1334    PT Time Calculation (min) 44 min    Activity Tolerance Patient tolerated treatment well    Behavior During Therapy WFL for tasks assessed/performed             Past Medical History:  Diagnosis Date   Anemia    Anginal pain ([place])    last cp was last year   Anxiety    Arthritis    Blood transfusion    Chest pain 02/04/2016   Coronary artery disease    Coronary artery spasm, wth continued episodes of chest pain.  09/26/2013   Coronary vasospasm (HCC)    Diabetes mellitus without complication (HCC)    type II   GERD (gastroesophageal reflux disease)    Headache    Hiatal hernia    Hypercholesteremia    Hypertension    NSTEMI (non-ST elevated myocardial infarction) ([place]) not sure   NSVT (nonsustained ventricular tachycardia) ([place]) 09/26/2013   Panic attack    Past Surgical History:  Procedure Laterality Date   ABDOMINAL HYSTERECTOMY     PARTIAL HYSTERECTOMY   ACDF N/A    C5-6 ACDF Dr. Sherwood Gambler   breast     right, tumor benign   BREAST EXCISIONAL BIOPSY Right    1961 (age 69) benign   CARDIAC CATHETERIZATION  2014   CARDIAC CATHETERIZATION  2012   CARDIAC CATHETERIZATION N/A 07/22/2016   Procedure: Left Heart Cath and Coronary Angiography;  Surgeon: Belva Crome, MD;  Location: [place] [facility];  Service: Cardiovascular;  Laterality: N/A;   cardiac stent  2012   to RCA   CHOLECYSTECTOMY N/A 10/02/2017   Procedure: LAPAROSCOPIC CHOLECYSTECTOMY;  Surgeon: Ralene Ok, MD;  Location: [facility];  Service: General;  Laterality: N/A;   CORONARY ANGIOPLASTY     ESOPHAGEAL MANOMETRY N/A  10/29/2016   Procedure: ESOPHAGEAL MANOMETRY (EM);  Surgeon: Garlan Fair, MD;  Location: [facility];  Service: Endoscopy;  Laterality: N/A;   ESOPHAGOGASTRODUODENOSCOPY (EGD) WITH PROPOFOL N/A 10/28/2016   Procedure: ESOPHAGOGASTRODUODENOSCOPY (EGD) WITH PROPOFOL;  Surgeon: Garlan Fair, MD;  Location: [facility];  Service: Endoscopy;  Laterality: N/A;   EXCISION OF SKIN TAG N/A 07/29/2017   Procedure: EXCISION OF PERIANAL  SKIN TAG;  Surgeon: Ralene Ok, MD;  Location: [place];  Service: General;  Laterality: N/A;   KNEE SURGERY  left   left ear surgery     for bad cut   LEFT HEART CATHETERIZATION WITH CORONARY ANGIOGRAM N/A 02/22/2013   Procedure: LEFT HEART CATHETERIZATION WITH CORONARY ANGIOGRAM;  Surgeon: [name], MD;  Location: [facility];  Service: Cardiovascular;  Laterality: N/A;   REVERSE SHOULDER ARTHROPLASTY Right 04/07/2022   Procedure: RIGHT SHOULDER REVERSE SHOULDER ARTHROPLASTY;  Surgeon: Vanetta Mulders, MD;  Location: [place];  Service: Orthopedics;  Laterality: Right;   SHOULDER SURGERY Bilateral    rotator cuff   TOE SURGERY Left    toe surgery   Patient Active Problem List   Diagnosis Date Noted   Rotator cuff arthropathy of right  shoulder    Carpal tunnel syndrome, right upper limb 01/03/2022   Chest pain 06/21/2021   Variant angina ([place]) 01/24/2017   Precordial chest pain 01/23/2017   Headache 01/23/2017   S/P angioplasty with stent 01/23/2017   Coronary artery disease 11/24/2014   Syncope 10/22/2014   Hyperglycemia 04/20/2014   H/O hiatal hernia 04/19/2014   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   THERAPY DIAG:  Acute pain of right shoulder  Abnormal  posture  Stiffness of right shoulder, not elsewhere classified  Muscle weakness (generalized)  Rationale for Evaluation and Treatment Rehabilitation  ONSET DATE: DOS 04/07/22  SUBJECTIVE:                                                                                                                                                                                      SUBJECTIVE STATEMENT: Denies N/T, it just itches.   PERTINENT HISTORY: H/o bil RCR  PAIN:  Are you having pain? Yes: NPRS scale: 8/10 Pain location: Rt shoulder Pain description: sore, achey Aggravating factors: constant since block wore off Relieving factors: pain meds  PRECAUTIONS: Shoulder, reverse TSA without subscap repair  WEIGHT BEARING RESTRICTIONS Yes NWB UE  FALLS:  Has patient fallen in last 6 months? No  LIVING ENVIRONMENT: Lives with: lives with their spouse No stairs  OCCUPATION: Not working  PLOF: Independent  PATIENT GOALS the use of my arm and hand back, gardening  OBJECTIVE:    PATIENT SURVEYS:  FOTO 4  COGNITION:  Overall cognitive status: Within functional limits for tasks assessed     SENSATION: WFL  POSTURE: Rounded shoulder as expected in sling, elevation of Rt shoulder due to guarding, slouched in seated  UPPER EXTREMITY ROM:   Passive ROM Right eval   Shoulder flexion 35   Shoulder extension    Shoulder abduction    Shoulder adduction    Shoulder internal rotation    Shoulder external rotation    Elbow flexion    Elbow extension    (Blank rows = not tested)  UPPER EXTREMITY MMT:  MMT    Shoulder flexion    Shoulder extension    Shoulder abduction    Shoulder adduction    Shoulder internal rotation    Shoulder external rotation    Middle trapezius    Lower trapezius    Elbow flexion    Elbow extension  Wrist flexion    Wrist extension    Wrist ulnar deviation    Wrist radial deviation    Wrist pronation    Wrist supination    Grip strength  (lbs)    (Blank rows = not tested)   PALPATION:  Spasm in Rt upper trap ane levator, very guarded in motion   TODAY'S TREATMENT:  MANUAL: PROM as tolerated, PT adjusted sling Scapular retraction Retraction+upper trap stretch Retraction+ cervical rotation Pendulums  Putty grip    PATIENT EDUCATION: Education details: Anatomy of condition, POC, HEP, exercise form/rationale, sling wear Person educated: Patient and Spouse Education method: Explanation, Demonstration, Tactile cues, Verbal cues, and Handouts Education comprehension: verbalized understanding, returned demonstration, verbal cues required, tactile cues required, and needs further education   HOME EXERCISE PROGRAM: [id number]   Out of sling multiple times daily for exercises.   ASSESSMENT:  CLINICAL IMPRESSION: Patient is a 76 y.o. F who was seen today for physical therapy evaluation and treatment for rehabilitation following reverse TSA on 04/07/22.    OBJECTIVE IMPAIRMENTS decreased activity tolerance, decreased knowledge of condition, decreased ROM, decreased strength, increased muscle spasms, impaired flexibility, impaired UE functional use, improper body mechanics, postural dysfunction, and pain.   ACTIVITY LIMITATIONS carrying, lifting, sleeping, bed mobility, bathing, dressing, reach over head, and hygiene/grooming  PARTICIPATION LIMITATIONS: meal prep, cleaning, laundry, driving, shopping, and yard work  PERSONAL FACTORS Past/current experiences and see PMH  are also affecting patient's functional outcome.   REHAB POTENTIAL: Good  CLINICAL DECISION MAKING: Stable/uncomplicated  EVALUATION COMPLEXITY: Low   GOALS: Goals reviewed with patient? Yes  SHORT TERM GOALS: Target date: 05/09/2022   AROM flexion to 90 without incr pain and good scap control Baseline:see chart Goal status: INITIAL  2.  ER in scapular plane to 30 deg Baseline: see chart Goal status: INITIAL  3.  Pain levels  <=3/10 Baseline: see subj reports Goal status: INITIAL  4.  D/c sling Baseline: as tolerated at 3 wk Goal status: INITIAL   LONG TERM GOALS: Target date: 05/23/2022    1.  Pt will meet FOTO goal Baseline: see above Goal status: INITIAL Target date: 06/20/22  2.  AROM flexion to 130 without incr pain and good scap control Baseline:  Goal status: INITIAL Target date: 05/23/2022   3.  Use of arm for ADLs pain <=3/10 Baseline:  Goal status: INITIAL Target date: 05/23/2022   4.  Initiate functional IR behind back without incr pain Baseline: can begin at 6 weeks per post op protocol Goal status: INITIAL Target date: 05/23/2022     PLAN: PT FREQUENCY: 1-2x/week  PT DURATION: 10 weeks  PLANNED INTERVENTIONS: Therapeutic exercises, Therapeutic activity, Neuromuscular re-education, Balance training, Gait training, Patient/Family education, Self Care, Joint mobilization, Aquatic Therapy, Dry Needling, Electrical stimulation, Spinal mobilization, Cryotherapy, Moist heat, scar mobilization, Taping, Vasopneumatic device, Manual therapy, and Re-evaluation  PLAN FOR NEXT SESSION: day 1 to week 3 (avoid behind back & across chest, avoid WB)   Alexie Lanni C. Lev Cervone PT, DPT 04/11/22 1:45 PM

## 2022-04-16 ENCOUNTER — Encounter (HOSPITAL_BASED_OUTPATIENT_CLINIC_OR_DEPARTMENT_OTHER): Payer: Self-pay | Admitting: Physical Therapy

## 2022-04-16 ENCOUNTER — Ambulatory Visit (HOSPITAL_BASED_OUTPATIENT_CLINIC_OR_DEPARTMENT_OTHER): Payer: Medicare Other | Admitting: Physical Therapy

## 2022-04-16 DIAGNOSIS — R293 Abnormal posture: Secondary | ICD-10-CM | POA: Diagnosis not present

## 2022-04-16 DIAGNOSIS — M25531 Pain in right wrist: Secondary | ICD-10-CM | POA: Diagnosis not present

## 2022-04-16 DIAGNOSIS — M12811 Other specific arthropathies, not elsewhere classified, right shoulder: Secondary | ICD-10-CM | POA: Diagnosis not present

## 2022-04-16 DIAGNOSIS — M25611 Stiffness of right shoulder, not elsewhere classified: Secondary | ICD-10-CM | POA: Diagnosis not present

## 2022-04-16 DIAGNOSIS — M6281 Muscle weakness (generalized): Secondary | ICD-10-CM | POA: Diagnosis not present

## 2022-04-16 DIAGNOSIS — M25511 Pain in right shoulder: Secondary | ICD-10-CM | POA: Diagnosis not present

## 2022-04-16 NOTE — Therapy (Signed)
OUTPATIENT PHYSICAL THERAPY SHOULDER TREATMENT   Patient Name: [name] MRN: [id number] DOB:1946-06-30, 76 y.o., female Today's Date: 04/16/2022   PT End of Session - 04/16/22 1106     Visit Number 2    Number of Visits 22    Date for PT Re-Evaluation 06/20/22    Authorization Type UHC MCR    Progress Note Due on Visit 10    PT Start Time 1106    PT Stop Time 1147    PT Time Calculation (min) 41 min    Activity Tolerance Patient tolerated treatment well    Behavior During Therapy WFL for tasks assessed/performed              Past Medical History:  Diagnosis Date   Anemia    Anginal pain ([place])    last cp was last year   Anxiety    Arthritis    Blood transfusion    Chest pain 02/04/2016   Coronary artery disease    Coronary artery spasm, wth continued episodes of chest pain.  09/26/2013   Coronary vasospasm ([facility])    Diabetes mellitus without complication (HCC)    type II   GERD (gastroesophageal reflux disease)    Headache    Hiatal hernia    Hypercholesteremia    Hypertension    NSTEMI (non-ST elevated myocardial infarction) ([place]) not sure   NSVT (nonsustained ventricular tachycardia) ([place]) 09/26/2013   Panic attack    Past Surgical History:  Procedure Laterality Date   ABDOMINAL HYSTERECTOMY     PARTIAL HYSTERECTOMY   ACDF N/A    C5-6 ACDF Dr. Sherwood Gambler   breast     right, tumor benign   BREAST EXCISIONAL BIOPSY Right    1961 (age 12) benign   CARDIAC CATHETERIZATION  2014   CARDIAC CATHETERIZATION  2012   CARDIAC CATHETERIZATION N/A 07/22/2016   Procedure: Left Heart Cath and Coronary Angiography;  Surgeon: Belva Crome, MD;  Location: [facility];  Service: Cardiovascular;  Laterality: N/A;   cardiac stent  2012   to RCA   CHOLECYSTECTOMY N/A 10/02/2017   Procedure: LAPAROSCOPIC CHOLECYSTECTOMY;  Surgeon: Ralene Ok, MD;  Location: [facility];  Service: General;  Laterality: N/A;   CORONARY ANGIOPLASTY     ESOPHAGEAL MANOMETRY N/A  10/29/2016   Procedure: ESOPHAGEAL MANOMETRY (EM);  Surgeon: Garlan Fair, MD;  Location: [facility];  Service: Endoscopy;  Laterality: N/A;   ESOPHAGOGASTRODUODENOSCOPY (EGD) WITH PROPOFOL N/A 10/28/2016   Procedure: ESOPHAGOGASTRODUODENOSCOPY (EGD) WITH PROPOFOL;  Surgeon: Garlan Fair, MD;  Location: [facility];  Service: Endoscopy;  Laterality: N/A;   EXCISION OF SKIN TAG N/A 07/29/2017   Procedure: EXCISION OF PERIANAL  SKIN TAG;  Surgeon: Ralene Ok, MD;  Location: [facility];  Service: General;  Laterality: N/A;   KNEE SURGERY  left   left ear surgery     for bad cut   LEFT HEART CATHETERIZATION WITH CORONARY ANGIOGRAM N/A 02/22/2013   Procedure: LEFT HEART CATHETERIZATION WITH CORONARY ANGIOGRAM;  Surgeon: [name], MD;  Location: [facility];  Service: Cardiovascular;  Laterality: N/A;   REVERSE SHOULDER ARTHROPLASTY Right 04/07/2022   Procedure: RIGHT SHOULDER REVERSE SHOULDER ARTHROPLASTY;  Surgeon: Vanetta Mulders, MD;  Location: [facility];  Service: Orthopedics;  Laterality: Right;   SHOULDER SURGERY Bilateral    rotator cuff   TOE SURGERY Left    toe surgery   Patient Active Problem List   Diagnosis Date Noted   Rotator cuff arthropathy of  right shoulder    Carpal tunnel syndrome, right upper limb 01/03/2022   Chest pain 06/21/2021   Variant angina ([place]) 01/24/2017   Precordial chest pain 01/23/2017   Headache 01/23/2017   S/P angioplasty with stent 01/23/2017   Coronary artery disease 11/24/2014   Syncope 10/22/2014   Hyperglycemia 04/20/2014   H/O hiatal hernia 04/19/2014   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    Coronary artery vasospasm ([place]) 09/26/2013   HTN (hypertension) 12/08/2011   GERD (gastroesophageal reflux disease) 12/08/2011   Depression 12/08/2011   Dyslipidemia 12/08/2011     REFERRING PROVIDER: Sammuel Hines, MD  REFERRING DIAG: s/p Rt reverse TSA + biceps tenodesis, without subscap repair    THERAPY DIAG:  Acute pain of right shoulder  Abnormal  posture  Stiffness of right shoulder, not elsewhere classified  Rationale for Evaluation and Treatment Rehabilitation  ONSET DATE: DOS 04/07/22  SUBJECTIVE:                                                                                                                                                                                      SUBJECTIVE STATEMENT: I took the sling off yesterday for a while because the under part of my arm swells. The only exercise I am having a hard time with is lifting it.   PERTINENT HISTORY: H/o bil RCR  PAIN:  Are you having pain? Yes: NPRS scale: 8/10 Pain location: Rt shoulder Pain description: sore, achey Aggravating factors: constant since block wore off Relieving factors: pain meds  PRECAUTIONS: Shoulder, reverse TSA without subscap repair  WEIGHT BEARING RESTRICTIONS Yes NWB UE  FALLS:  Has patient fallen in last 6 months? No  LIVING ENVIRONMENT: Lives with: lives with their spouse No stairs  OCCUPATION: Not working  PLOF: Independent  PATIENT GOALS the use of my arm and hand back, gardening  OBJECTIVE:    PATIENT SURVEYS:  FOTO 4  COGNITION:  Overall cognitive status: Within functional limits for tasks assessed     SENSATION: WFL  POSTURE: Rounded shoulder as expected in sling, elevation of Rt shoulder due to guarding, slouched in seated  UPPER EXTREMITY ROM:   Passive ROM Right eval   Shoulder flexion 35   Shoulder extension    Shoulder abduction    Shoulder adduction    Shoulder internal rotation    Shoulder external rotation    Elbow flexion    Elbow extension    (Blank rows = not tested)  UPPER EXTREMITY MMT:  MMT    Shoulder flexion    Shoulder extension    Shoulder abduction    Shoulder adduction    Shoulder internal rotation    Shoulder external rotation  Middle trapezius    Lower trapezius    Elbow flexion    Elbow extension    Wrist flexion    Wrist extension    Wrist ulnar deviation     Wrist radial deviation    Wrist pronation    Wrist supination    Grip strength (lbs)    (Blank rows = not tested)   PALPATION:  Spasm in Rt upper trap ane levator, very guarded in motion   TODAY'S TREATMENT:   9/6  MANUAL: passive flexion ROM, STM upper trap; STM Rt pec minor/major, passive elbow motion with AP pressure on GHJ Supine scap retraction Self PROM/AAROM Towel slide at table Pendulums Deep breathing AROM biceps curls    PATIENT EDUCATION: Education details: Anatomy of condition, POC, HEP, exercise form/rationale, sling wear Person educated: Patient and Spouse Education method: Explanation, Demonstration, Tactile cues, Verbal cues, and Handouts Education comprehension: verbalized understanding, returned demonstration, verbal cues required, tactile cues required, and needs further education   HOME EXERCISE PROGRAM: [id number]   Out of sling multiple times daily for exercises.   ASSESSMENT:  CLINICAL IMPRESSION: Pt is  1 wk and 2 days s/p rev TSA without subscap repair. Able to easily obtain 90 deg flexion passively end range limited by guarding through upper trap & pectoralis. Unable to perform AAROM with wand so she was encouraged to use her opp UE for assistance.    OBJECTIVE IMPAIRMENTS decreased activity tolerance, decreased knowledge of condition, decreased ROM, decreased strength, increased muscle spasms, impaired flexibility, impaired UE functional use, improper body mechanics, postural dysfunction, and pain.   ACTIVITY LIMITATIONS carrying, lifting, sleeping, bed mobility, bathing, dressing, reach over head, and hygiene/grooming  PARTICIPATION LIMITATIONS: meal prep, cleaning, laundry, driving, shopping, and yard work  PERSONAL FACTORS Past/current experiences and see PMH  are also affecting patient's functional outcome.   REHAB POTENTIAL: Good  CLINICAL DECISION MAKING: Stable/uncomplicated  EVALUATION COMPLEXITY: Low   GOALS: Goals reviewed  with patient? Yes  SHORT TERM GOALS: Target date: 05/09/2022   AROM flexion to 90 without incr pain and good scap control Baseline:see chart Goal status: INITIAL  2.  ER in scapular plane to 30 deg Baseline: see chart Goal status: INITIAL  3.  Pain levels <=3/10 Baseline: see subj reports Goal status: INITIAL  4.  D/c sling Baseline: as tolerated at 3 wk Goal status: INITIAL   LONG TERM GOALS: Target date: 05/23/2022    1.  Pt will meet FOTO goal Baseline: see above Goal status: INITIAL Target date: 06/20/22  2.  AROM flexion to 130 without incr pain and good scap control Baseline:  Goal status: INITIAL Target date: 05/23/2022   3.  Use of arm for ADLs pain <=3/10 Baseline:  Goal status: INITIAL Target date: 05/23/2022   4.  Initiate functional IR behind back without incr pain Baseline: can begin at 6 weeks per post op protocol Goal status: INITIAL Target date: 05/23/2022     PLAN: PT FREQUENCY: 1-2x/week  PT DURATION: 10 weeks  PLANNED INTERVENTIONS: Therapeutic exercises, Therapeutic activity, Neuromuscular re-education, Balance training, Gait training, Patient/Family education, Self Care, Joint mobilization, Aquatic Therapy, Dry Needling, Electrical stimulation, Spinal mobilization, Cryotherapy, Moist heat, scar mobilization, Taping, Vasopneumatic device, Manual therapy, and Re-evaluation  PLAN FOR NEXT SESSION: day 1 to week 3 (avoid behind back & across chest, avoid WB)   Sojourner Behringer C. Everet Flagg PT, DPT 04/16/22 2:22 PM

## 2022-04-21 ENCOUNTER — Encounter (HOSPITAL_BASED_OUTPATIENT_CLINIC_OR_DEPARTMENT_OTHER): Payer: Self-pay | Admitting: Physical Therapy

## 2022-04-21 ENCOUNTER — Ambulatory Visit: Payer: Medicare Other | Attending: Orthopedic Surgery | Admitting: Occupational Therapy

## 2022-04-21 ENCOUNTER — Ambulatory Visit (INDEPENDENT_AMBULATORY_CARE_PROVIDER_SITE_OTHER): Payer: Medicare Other

## 2022-04-21 ENCOUNTER — Ambulatory Visit (HOSPITAL_BASED_OUTPATIENT_CLINIC_OR_DEPARTMENT_OTHER): Payer: Medicare Other | Admitting: Physical Therapy

## 2022-04-21 ENCOUNTER — Ambulatory Visit (INDEPENDENT_AMBULATORY_CARE_PROVIDER_SITE_OTHER): Payer: Medicare Other | Admitting: Orthopaedic Surgery

## 2022-04-21 ENCOUNTER — Other Ambulatory Visit (HOSPITAL_BASED_OUTPATIENT_CLINIC_OR_DEPARTMENT_OTHER): Payer: Self-pay

## 2022-04-21 DIAGNOSIS — M6281 Muscle weakness (generalized): Secondary | ICD-10-CM

## 2022-04-21 DIAGNOSIS — R208 Other disturbances of skin sensation: Secondary | ICD-10-CM | POA: Diagnosis not present

## 2022-04-21 DIAGNOSIS — Z96611 Presence of right artificial shoulder joint: Secondary | ICD-10-CM | POA: Diagnosis not present

## 2022-04-21 DIAGNOSIS — M25511 Pain in right shoulder: Secondary | ICD-10-CM

## 2022-04-21 DIAGNOSIS — Z9889 Other specified postprocedural states: Secondary | ICD-10-CM

## 2022-04-21 DIAGNOSIS — M79631 Pain in right forearm: Secondary | ICD-10-CM | POA: Diagnosis not present

## 2022-04-21 DIAGNOSIS — R293 Abnormal posture: Secondary | ICD-10-CM

## 2022-04-21 DIAGNOSIS — R278 Other lack of coordination: Secondary | ICD-10-CM

## 2022-04-21 DIAGNOSIS — Z471 Aftercare following joint replacement surgery: Secondary | ICD-10-CM | POA: Diagnosis not present

## 2022-04-21 DIAGNOSIS — M12811 Other specific arthropathies, not elsewhere classified, right shoulder: Secondary | ICD-10-CM | POA: Diagnosis not present

## 2022-04-21 DIAGNOSIS — M25531 Pain in right wrist: Secondary | ICD-10-CM | POA: Diagnosis not present

## 2022-04-21 DIAGNOSIS — M25611 Stiffness of right shoulder, not elsewhere classified: Secondary | ICD-10-CM | POA: Diagnosis not present

## 2022-04-21 MED ORDER — HYDROCODONE-ACETAMINOPHEN 5-325 MG PO TABS
1.0000 | ORAL_TABLET | Freq: Four times a day (QID) | ORAL | 0 refills | Status: DC | PRN
  Filled 2022-04-21: qty 30, 8d supply, fill #0

## 2022-04-21 NOTE — Therapy (Signed)
OUTPATIENT PHYSICAL THERAPY SHOULDER TREATMENT   Patient Name: [name] MRN: [id number] DOB:[birth year], 76 y.o., female Today's Date: 04/21/2022   PT End of Session - 04/21/22 1019     Visit Number 3    Number of Visits 22    Date for PT Re-Evaluation 06/20/22    Authorization Type UHC MCR    Progress Note Due on Visit 10    PT Start Time 1015    PT Stop Time 1057    PT Time Calculation (min) 42 min    Activity Tolerance Patient tolerated treatment well    Behavior During Therapy WFL for tasks assessed/performed               Past Medical History:  Diagnosis Date   Anemia    Anginal pain ([place])    last cp was last year   Anxiety    Arthritis    Blood transfusion    Chest pain 02/04/2016   Coronary artery disease    Coronary artery spasm, wth continued episodes of chest pain.  09/26/2013   Coronary vasospasm (HCC)    Diabetes mellitus without complication (HCC)    type II   GERD (gastroesophageal reflux disease)    Headache    Hiatal hernia    Hypercholesteremia    Hypertension    NSTEMI (non-ST elevated myocardial infarction) ([place]) not sure   NSVT (nonsustained ventricular tachycardia) ([place]) 09/26/2013   Panic attack    Past Surgical History:  Procedure Laterality Date   ABDOMINAL HYSTERECTOMY     PARTIAL HYSTERECTOMY   ACDF N/A    C5-6 ACDF Dr. Sherwood Gambler   breast     right, tumor benign   BREAST EXCISIONAL BIOPSY Right    1961 (age 76) benign   CARDIAC CATHETERIZATION  2014   CARDIAC CATHETERIZATION  2012   CARDIAC CATHETERIZATION N/A 07/22/2016   Procedure: Left Heart Cath and Coronary Angiography;  Surgeon: Belva Crome, MD;  Location: [facility];  Service: Cardiovascular;  Laterality: N/A;   cardiac stent  2012   to RCA   CHOLECYSTECTOMY N/A 10/02/2017   Procedure: LAPAROSCOPIC CHOLECYSTECTOMY;  Surgeon: Ralene Ok, MD;  Location: [facility];  Service: General;  Laterality: N/A;   CORONARY ANGIOPLASTY     ESOPHAGEAL MANOMETRY N/A  10/29/2016   Procedure: ESOPHAGEAL MANOMETRY (EM);  Surgeon: Garlan Fair, MD;  [facility]: [facility];  Service: Endoscopy;  Laterality: N/A;   ESOPHAGOGASTRODUODENOSCOPY (EGD) WITH PROPOFOL N/A 10/28/2016   Procedure: ESOPHAGOGASTRODUODENOSCOPY (EGD) WITH PROPOFOL;  Surgeon: Garlan Fair, MD;  Location: [facility];  Service: Endoscopy;  Laterality: N/A;   EXCISION OF SKIN TAG N/A 07/29/2017   Procedure: EXCISION OF PERIANAL  SKIN TAG;  Surgeon: Ralene Ok, MD;  Location: [place] [facility];  Service: General;  Laterality: N/A;   KNEE SURGERY  left   left ear surgery     for bad cut   LEFT HEART CATHETERIZATION WITH CORONARY ANGIOGRAM N/A 02/22/2013   Procedure: LEFT HEART CATHETERIZATION WITH CORONARY ANGIOGRAM;  Surgeon: [name], MD;  Location: [facility];  Service: Cardiovascular;  Laterality: N/A;   REVERSE SHOULDER ARTHROPLASTY Right 04/07/2022   Procedure: RIGHT SHOULDER REVERSE SHOULDER ARTHROPLASTY;  Surgeon: Vanetta Mulders, MD;  Location: [facility];  Service: Orthopedics;  Laterality: Right;   SHOULDER SURGERY Bilateral    rotator cuff   TOE SURGERY Left    toe surgery   Patient Active Problem List   Diagnosis Date Noted   Rotator cuff arthropathy  of right shoulder    Carpal tunnel syndrome, right upper limb 01/03/2022   Chest pain 06/21/2021   Variant angina ([place]) 01/24/2017   Precordial chest pain 01/23/2017   Headache 01/23/2017   S/P angioplasty with stent 01/23/2017   Coronary artery disease 11/24/2014   Syncope 10/22/2014   Hyperglycemia 04/20/2014   H/O hiatal hernia 04/19/2014   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    Coronary artery vasospasm ([place]) 09/26/2013   HTN (hypertension) 12/08/2011   GERD (gastroesophageal reflux disease) 12/08/2011   Depression 12/08/2011   Dyslipidemia 12/08/2011     REFERRING PROVIDER: Sammuel Hines, MD  REFERRING DIAG: s/p Rt reverse TSA + biceps tenodesis, without subscap repair    THERAPY DIAG:  Acute pain of right shoulder  Abnormal  posture  Rationale for Evaluation and Treatment Rehabilitation  ONSET DATE: DOS 04/07/22  SUBJECTIVE:                                                                                                                                                                                      SUBJECTIVE STATEMENT: It is hurting. He gave me some pain medicine. Denies N/T.   PERTINENT HISTORY: H/o bil RCR  PAIN:  Are you having pain? Yes: NPRS scale: 8/10 Pain location: Rt shoulder Pain description: sore, achey Aggravating factors: constant since block wore off Relieving factors: pain meds  PRECAUTIONS: Shoulder, reverse TSA without subscap repair  WEIGHT BEARING RESTRICTIONS Yes NWB UE  FALLS:  Has patient fallen in last 6 months? No  LIVING ENVIRONMENT: Lives with: lives with their spouse No stairs  OCCUPATION: Not working  PLOF: Independent  PATIENT GOALS the use of my arm and hand back, gardening  OBJECTIVE:    PATIENT SURVEYS:  FOTO 4  COGNITION:  Overall cognitive status: Within functional limits for tasks assessed     SENSATION: WFL  POSTURE: Rounded shoulder as expected in sling, elevation of Rt shoulder due to guarding, slouched in seated  UPPER EXTREMITY ROM:   Passive ROM Right eval   Shoulder flexion 35   Shoulder extension    Shoulder abduction    Shoulder adduction    Shoulder internal rotation    Shoulder external rotation    Elbow flexion    Elbow extension    (Blank rows = not tested)  UPPER EXTREMITY MMT:  MMT    Shoulder flexion    Shoulder extension    Shoulder abduction    Shoulder adduction    Shoulder internal rotation    Shoulder external rotation    Middle trapezius    Lower trapezius    Elbow flexion    Elbow extension    Wrist flexion  Wrist extension    Wrist ulnar deviation    Wrist radial deviation    Wrist pronation    Wrist supination    Grip strength (lbs)    (Blank rows = not tested)   PALPATION:  Spasm  in Rt upper trap ane levator, very guarded in motion   TODAY'S TREATMENT:   9/11 MANUAL: STM Rt upper trap, passive ROM as allowed per protocol Self AAROM flexion in supine- cues for shoulder depression & scap retraciton in between reps PT resisted GHJ fwd- back Scapular retraction with tactile cuing Shoulder isometrics at wall: ext, ER, abd, IR    PATIENT EDUCATION: Education details: Anatomy of condition, POC, HEP, exercise form/rationale, sling wear Person educated: Patient and Spouse Education method: Explanation, Demonstration, Tactile cues, Verbal cues, and Handouts Education comprehension: verbalized understanding, returned demonstration, verbal cues required, tactile cues required, and needs further education   HOME EXERCISE PROGRAM: [id number]   Out of sling multiple times daily for exercises.   ASSESSMENT:  CLINICAL IMPRESSION: Pt is  2 wk and 0 days s/p rev TSA without subscap repair.  Placed a bandaid over proximal end of incision due to some oozing after just having stitches removed. Able to perform self AAROM to just below 90 deg. Excellent tolerance to addition of isometrics as long as tactule cues were provided to decrease upper trap use.     OBJECTIVE IMPAIRMENTS decreased activity tolerance, decreased knowledge of condition, decreased ROM, decreased strength, increased muscle spasms, impaired flexibility, impaired UE functional use, improper body mechanics, postural dysfunction, and pain.   ACTIVITY LIMITATIONS carrying, lifting, sleeping, bed mobility, bathing, dressing, reach over head, and hygiene/grooming  PARTICIPATION LIMITATIONS: meal prep, cleaning, laundry, driving, shopping, and yard work  PERSONAL FACTORS Past/current experiences and see PMH  are also affecting patient's functional outcome.   REHAB POTENTIAL: Good  CLINICAL DECISION MAKING: Stable/uncomplicated  EVALUATION COMPLEXITY: Low   GOALS: Goals reviewed with patient? Yes  SHORT  TERM GOALS: Target date: 05/09/2022   AROM flexion to 90 without incr pain and good scap control Baseline:see chart Goal status: INITIAL  2.  ER in scapular plane to 30 deg Baseline: see chart Goal status: INITIAL  3.  Pain levels <=3/10 Baseline: see subj reports Goal status: INITIAL  4.  D/c sling Baseline: as tolerated at 3 wk Goal status: INITIAL   LONG TERM GOALS: Target date: 05/23/2022    1.  Pt will meet FOTO goal Baseline: see above Goal status: INITIAL Target date: 06/20/22  2.  AROM flexion to 130 without incr pain and good scap control Baseline:  Goal status: INITIAL Target date: 05/23/2022   3.  Use of arm for ADLs pain <=3/10 Baseline:  Goal status: INITIAL Target date: 05/23/2022   4.  Initiate functional IR behind back without incr pain Baseline: can begin at 6 weeks per post op protocol Goal status: INITIAL Target date: 05/23/2022     PLAN: PT FREQUENCY: 1-2x/week  PT DURATION: 10 weeks  PLANNED INTERVENTIONS: Therapeutic exercises, Therapeutic activity, Neuromuscular re-education, Balance training, Gait training, Patient/Family education, Self Care, Joint mobilization, Aquatic Therapy, Dry Needling, Electrical stimulation, Spinal mobilization, Cryotherapy, Moist heat, scar mobilization, Taping, Vasopneumatic device, Manual therapy, and Re-evaluation  PLAN FOR NEXT SESSION: day 1 to week 3 (avoid behind back & across chest, avoid WB)   Zaydah Nawabi C. Jerrod Damiano PT, DPT 04/21/22 10:58 AM

## 2022-04-21 NOTE — Therapy (Signed)
OUTPATIENT OCCUPATIONAL THERAPY TREATMENT NOTE & Discharge  Patient Name: [name] MRN: [id number] DOB:1946-01-25, 76 y.o., female Today's Date: 04/21/2022  PCP: [name], MD REFERRING PROVIDER: Sherilyn Cooter, MD   OCCUPATIONAL THERAPY DISCHARGE SUMMARY  Visits from Start of Care: 7  Current functional level related to goals / functional outcomes: Pt was cleared by Dr. Sammuel Hines to resume OT services in conjunction with PT to address R TSA, however pt reports she is experiencing too much pain at this time and expresses desire to focus on shoulder recover at this time.  Pt is demonstrating increased swelling in hand s/p surgery and due to increased immobilization.  Unable to assess goals due to pain in shoulder and positioning in sling.   Remaining deficits: Swelling in wrist and hand, decreased functional use at this time due to recent surgery and limited mobility.   Education / Equipment: Educated on massage, desensitization, and HEP for ROM.   Patient agrees to discharge. Patient goals were not met. Patient is being discharged due to the patient's request..      OT End of Session - 04/21/22 1155     Visit Number 7    Number of Visits 17    Date for OT Re-Evaluation 05/16/22    Authorization Type [employer]    OT Start Time 1152    OT Stop Time 1208    OT Time Calculation (min) 16 min    Activity Tolerance Patient tolerated treatment well    Behavior During Therapy WFL for tasks assessed/performed              Past Medical History:  Diagnosis Date   Anemia    Anginal pain ([place])    last cp was last year   Anxiety    Arthritis    Blood transfusion    Chest pain 02/04/2016   Coronary artery disease    Coronary artery spasm, wth continued episodes of chest pain.  09/26/2013   Coronary vasospasm (H[facility])    Diabetes mellitus without complication (HCC)    type II   GERD (gastroesophageal reflux disease)    Headache    Hiatal hernia     Hypercholesteremia    Hypertension    NSTEMI (non-ST elevated myocardial infarction) ([place]) not sure   NSVT (nonsustained ventricular tachycardia) ([place]) 09/26/2013   Panic attack    Past Surgical History:  Procedure Laterality Date   ABDOMINAL HYSTERECTOMY     PARTIAL HYSTERECTOMY   ACDF N/A    C5-6 ACDF Dr. Sherwood Gambler   breast     right, tumor benign   BREAST EXCISIONAL BIOPSY Right    1961 (age 71) benign   CARDIAC CATHETERIZATION  2014   CARDIAC CATHETERIZATION  2012   CARDIAC CATHETERIZATION N/A 07/22/2016   Procedure: Left Heart Cath and Coronary Angiography;  Surgeon: Belva Crome, MD;  Location: [facility];  Service: Cardiovascular;  Laterality: N/A;   cardiac stent  2012   to RCA   CHOLECYSTECTOMY N/A 10/02/2017   Procedure: LAPAROSCOPIC CHOLECYSTECTOMY;  Surgeon: Ralene Ok, MD;  Location: [facility];  Service: General;  Laterality: N/A;   CORONARY ANGIOPLASTY     ESOPHAGEAL MANOMETRY N/A 10/29/2016   Procedure: ESOPHAGEAL MANOMETRY (EM);  Surgeon: Garlan Fair, MD;  Location: [facility];  Service: Endoscopy;  Laterality: N/A;   ESOPHAGOGASTRODUODENOSCOPY (EGD) WITH PROPOFOL N/A 10/28/2016   Procedure: ESOPHAGOGASTRODUODENOSCOPY (EGD) WITH PROPOFOL;  Surgeon: Garlan Fair, MD;  Location: [facility];  Service: Endoscopy;  Laterality: N/A;  EXCISION OF SKIN TAG N/A 07/29/2017   Procedure: EXCISION OF PERIANAL  SKIN TAG;  Surgeon: Ralene Ok, MD;  Location: [facility];  Service: General;  Laterality: N/A;   KNEE SURGERY  left   left ear surgery     for bad cut   LEFT HEART CATHETERIZATION WITH CORONARY ANGIOGRAM N/A 02/22/2013   Procedure: LEFT HEART CATHETERIZATION WITH CORONARY ANGIOGRAM;  Surgeon: [name], MD;  [facility];  Service: Cardiovascular;  Laterality: N/A;   REVERSE SHOULDER ARTHROPLASTY Right 04/07/2022   Procedure: RIGHT SHOULDER REVERSE SHOULDER ARTHROPLASTY;  Surgeon: Vanetta Mulders, MD;  Location: [facility];   Service: Orthopedics;  Laterality: Right;   SHOULDER SURGERY Bilateral    rotator cuff   TOE SURGERY Left    toe surgery   Patient Active Problem List   Diagnosis Date Noted   Rotator cuff arthropathy of right shoulder    Carpal tunnel syndrome, right upper limb 01/03/2022   Chest pain 06/21/2021   Variant angina ([place]) 01/24/2017   Precordial chest pain 01/23/2017   Headache 01/23/2017   S/P angioplasty with stent 01/23/2017   Coronary artery disease 11/24/2014   Syncope 10/22/2014   Hyperglycemia 04/20/2014   H/O hiatal hernia 04/19/2014   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    Coronary artery vasospasm ([place]) 09/26/2013   HTN (hypertension) 12/08/2011   GERD (gastroesophageal reflux disease) 12/08/2011   Depression 12/08/2011   Dyslipidemia 12/08/2011    ONSET DATE: 03/10/22 (referral date)  REFERRING DIAG: G56.01 (ICD-10-CM) - Carpal tunnel syndrome, right upper limb   THERAPY DIAG:  Pain in right wrist  Pain in right forearm  Other disturbances of skin sensation  Other lack of coordination  Muscle weakness (generalized)  Rationale for Evaluation and Treatment Rehabilitation  SUBJECTIVE:   SUBJECTIVE STATEMENT: Pt reports sore and worn out from MD appointment with removal of dressing, and PT appointment for shoulder.  Pt expressing desire to hold off on therapy to allow her to focus on shoulder recovery and then resume OT if needed afterwards.   Pt accompanied by: self  PERTINENT HISTORY: s/p CTR on 01/24/22; h/o cervical radiculopathy, s/p reverse Rt TSA on 04/07/22  PRECAUTIONS: Shoulder  PAIN: Are you having pain? Yes: NPRS scale: 7/10 Pain location: R shoulder Pain description: aching, tightness Aggravating factors: positioning in splint, MD took off dressing today Relieving factors: repositioning, rest, ice  HAND DOMINANCE: Right  PLOF: Independent and pain intermittently with certain tasks  PATIENT GOALS to get my dominant hand better   OBJECTIVE:   TODAY'S  TREATMENT: Edema management: educated on positioning, ice, massage for edema management.  Educated on use of theraputty as tolerated for increased ROM as movement acts as a pump to decrease edema.    PATIENT EDUCATION: See tx section above Person educated: Patient Education method: Explanation Education comprehension: verbalized understanding and needs further education   HOME EXERCISE PROGRAM: Access Code: [email or web address] URL: [email or web address] Date: 03/24/2022 Prepared by: [facility]  Exercises - Wrist Tendon Gliding  - 3-4 x daily - 2 sets - 10 reps - Wrist Flexion Extension AROM - Palms Down  - 3-4 x daily - 2 sets - 10 reps - Seated Wrist Flexion Stretch  - 3-4 x daily - 2 sets - 10 reps  GOALS: Goals reviewed with patient? Yes  SHORT TERM GOALS: Target date:  04/18/22     Pt will be independent in HEP with focus on pain management, strengthening, massage, and desensitization strategies.  Baseline: to be administered Goal status: MET - 04/02/22  Pt will verbalize understanding of adapted strategies and AE to maximize safety and Independence with ADLs/IADLs.   Baseline: decreased knowledge of compensatory strategies Goal status: IN PROGRESS  Pt will demonstrate and/or report improved sustained grip strength to engage in ADLs/IADLs without increased pain >2/10 on pain scale and without dropping items. Baseline: Right: 8 lbs; Left: 29 lbs Goal status: IN PROGRESS  LONG TERM GOALS: Target date:  05/16/22     Pt will demonstrate improved wrist flexion/extension as evidenced by decreased report of pain during ADLs/IADLs. Baseline: 51 deg Goal status: IN PROGRESS  Pt will demonstrate improved UE functional use for ADLs as evidenced by increasing box/blocks score by 4 blocks with RUE without report of increased pain > 2/10 on pain scale. Baseline: Right 42 blocks, Left 48 blocks Goal status: IN PROGRESS  Pt will demonstrate  improved functional use of dominant RUE to engage in ADLs and IADLs as reported on QuickDASH with improved score of 50% or less Baseline: 70.45 Goal status: IN PROGRESS  Pt will demonstrate and/or report improved use of RUE during [facility] and handwriting tasks without increased pain of 2/10 on pain scale. Baseline: 8/10 Goal status: IN PROGRESS   ASSESSMENT:  CLINICAL IMPRESSION: Pt has been cleared by Dr. Sammuel Hines to resume OT for carpal tunnel release in tandem with PT for R TSA.  Engaged in lengthy discussion about current pain and limitations s/p R TSA. Pt reports increased swelling in hand due to decreased mobility s/p surgery.  Educated on edema management with positioning, ice, massage and use of putty for increased hand mobility.  Pt reports that it may be too much to engage in both PT and OT at this time and wants to focus her efforts on her shoulder.  Pt reports understanding of return to therapy recommendations and need for referral to return if needed.  Pt to be d/c from OT at this time and will seek referral if need arises s/p completion of PT for shoulder.  PERFORMANCE DEFICITS in functional skills including ADLs, IADLs, coordination, dexterity, edema, ROM, strength, pain, flexibility, FMC, GMC, body mechanics, decreased knowledge of precautions, decreased knowledge of use of DME, wound, skin integrity, and UE functional use.  IMPAIRMENTS are limiting patient from ADLs, IADLs, and rest and sleep.   COMORBIDITIES may have co-morbidities  that affects occupational performance. Patient will benefit from skilled OT to address above impairments and improve overall function.   PLAN: OT FREQUENCY: 2x/week  OT DURATION: 8 weeks  PLANNED INTERVENTIONS: self care/ADL training, therapeutic exercise, therapeutic activity, manual therapy, scar mobilization, passive range of motion, splinting, ultrasound, paraffin, compression bandaging, moist heat, cryotherapy, patient/family education, and DME  and/or AE instructions  RECOMMENDED OTHER SERVICES: N/A  CONSULTED AND AGREED WITH PLAN OF CARE: Patient  PLAN FOR NEXT SESSION: D/C at this time.   Simonne Come, OTR/L 04/21/2022, 12:26 PM

## 2022-04-21 NOTE — Progress Notes (Signed)
Post Operative Evaluation    Procedure/Date of Surgery: Right shoulder reverse shoulder arthroplasty 04/07/22  Interval History:   Presents today 2 weeks status post the above procedure.  Overall she is doing well.  She is having some pain with physical therapy.  She is continuing to work through this.  She states that she is not getting any relief from oxycodone.  She has been compliant with anticoagulation.   PMH/PSH/Family History/Social History/Meds/Allergies:    Past Medical History:  Diagnosis Date   Anemia    Anginal pain ([place])    last cp was last year   Anxiety    Arthritis    Blood transfusion    Chest pain 02/04/2016   Coronary artery disease    Coronary artery spasm, wth continued episodes of chest pain.  09/26/2013   Coronary vasospasm (H[facility])    Diabetes mellitus without complication (HCC)    type II   GERD (gastroesophageal reflux disease)    Headache    Hiatal hernia    Hypercholesteremia    Hypertension    NSTEMI (non-ST elevated myocardial infarction) ([place]) not sure   NSVT (nonsustained ventricular tachycardia) ([place]) 09/26/2013   Panic attack    Past Surgical History:  Procedure Laterality Date   ABDOMINAL HYSTERECTOMY     PARTIAL HYSTERECTOMY   ACDF N/A    C5-6 ACDF Dr. Sherwood Gambler   breast     right, tumor benign   BREAST EXCISIONAL BIOPSY Right    1961 (age 70) benign   CARDIAC CATHETERIZATION  2014   CARDIAC CATHETERIZATION  2012   CARDIAC CATHETERIZATION N/A 07/22/2016   Procedure: Left Heart Cath and Coronary Angiography;  Surgeon: Belva Crome, MD;  Location: [facility];  Service: Cardiovascular;  Laterality: N/A;   cardiac stent  2012   to RCA   CHOLECYSTECTOMY N/A 10/02/2017   Procedure: LAPAROSCOPIC CHOLECYSTECTOMY;  Surgeon: Ralene Ok, MD;  Location: [facility];  Service: General;  Laterality: N/A;   CORONARY ANGIOPLASTY     ESOPHAGEAL MANOMETRY N/A 10/29/2016   Procedure: ESOPHAGEAL MANOMETRY  (EM);  Surgeon: Garlan Fair, MD;  [facility];  Service: Endoscopy;  Laterality: N/A;   ESOPHAGOGASTRODUODENOSCOPY (EGD) WITH PROPOFOL N/A 10/28/2016   Procedure: ESOPHAGOGASTRODUODENOSCOPY (EGD) WITH PROPOFOL;  Surgeon: Garlan Fair, MD;  [facility];  Service: Endoscopy;  Laterality: N/A;   EXCISION OF SKIN TAG N/A 07/29/2017   Procedure: EXCISION OF PERIANAL  SKIN TAG;  Surgeon: Ralene Ok, MD;  Location: [facility];  Service: General;  Laterality: N/A;   KNEE SURGERY  left   left ear surgery     for bad cut   LEFT HEART CATHETERIZATION WITH CORONARY ANGIOGRAM N/A 02/22/2013   Procedure: LEFT HEART CATHETERIZATION WITH CORONARY ANGIOGRAM;  Surgeon: [name], MD;  Location: [facility] CATH LAB;  Service: Cardiovascular;  Laterality: N/A;   REVERSE SHOULDER ARTHROPLASTY Right 04/07/2022   Procedure: RIGHT SHOULDER REVERSE SHOULDER ARTHROPLASTY;  Surgeon: Vanetta Mulders, MD;  Location: [facility];  Service: Orthopedics;  Laterality: Right;   SHOULDER SURGERY Bilateral    rotator cuff   TOE SURGERY Left    toe surgery   Social History   Socioeconomic History   Marital status: Married    Spouse name: [name]   Number of children: 0   Years of  education: Not on file   Highest education level: Not on file  Occupational History   Not on file  Tobacco Use   Smoking status: Every Day    Packs/day: 0.50    Years: 40.00    Total pack years: 20.00    Types: Cigarettes    Last attempt to quit: 01/10/2016    Years since quitting: 6.2   Smokeless tobacco: Never   Tobacco comments:    Pt reports she smokes a pack or less per week of cigarettes  Vaping Use   Vaping Use: Never used  Substance and Sexual Activity   Alcohol use: Yes    Alcohol/week: 1.0 standard drink of alcohol    Types: 1 Cans of beer per week    Comment: occasional beer   Drug use: No   Sexual activity: Never  Other Topics Concern   Not on file  Social History Narrative   Lives with husband.  Ambulates independently.   Social Determinants of Health   Financial Resource Strain: Not on file  Food Insecurity: No Food Insecurity (03/24/2022)   Hunger Vital Sign    Worried About Running Out of Food in the Last Year: Never true    Ran Out of Food in the Last Year: Never true  Transportation Needs: No Transportation Needs (03/24/2022)   PRAPARE - Hydrologist (Medical): No    Lack of Transportation (Non-Medical): No  Physical Activity: Not on file  Stress: Not on file  Social Connections: Not on file   Family History  Problem Relation Age of Onset   Heart failure Mother    Stroke Sister    Heart attack Other    No Known Allergies Current Outpatient Medications  Medication Sig Dispense Refill   HYDROcodone-acetaminophen (NORCO/VICODIN) 5-325 MG tablet Take 1 tablet by mouth every 6 (six) hours as needed for moderate pain. 30 tablet 0   ACCU-CHEK GUIDE test strip USE TO CHECK BLOOD SUGAR DAILY     Accu-Chek Softclix Lancets lancets daily.     amLODipine (NORVASC) 2.5 MG tablet Take 1 tablet (2.5 mg total) by mouth daily. 30 tablet 1   aspirin EC 325 MG tablet Take 1 tablet (325 mg total) by mouth daily. 30 tablet 0   buPROPion (ZYBAN) 150 MG 12 hr tablet Take 150 mg by mouth every morning.     dapagliflozin propanediol (FARXIGA) 10 MG TABS tablet Take 1 tablet (10 mg total) by mouth daily before breakfast. 30 tablet 11   HYDROcodone-acetaminophen (NORCO/VICODIN) 5-325 MG tablet Take 1 tablet by mouth every 6 (six) hours as needed for moderate pain. 30 tablet 0   metFORMIN (GLUCOPHAGE-XR) 500 MG 24 hr tablet Take 500 mg by mouth 2 (two) times daily.     methocarbamol (ROBAXIN) 500 MG tablet Take 1 tablet (500 mg total) by mouth 4 (four) times daily. 30 tablet 3   nitroGLYCERIN (NITROSTAT) 0.4 MG SL tablet PLACE 1 TABLET UNDER TONGUE AS NEEDED FOR CHEST PAIN EVERY 5 MINUTES UP TO 3 DOSES THEN SEEK MEDICAL ATTENTION. Please make overdue appt. 1st attempt  25 tablet 0   ondansetron (ZOFRAN-ODT) 8 MG disintegrating tablet Take 1 tablet (8 mg total) by mouth every 8 (eight) hours as needed for nausea or vomiting. 10 tablet 0   oxyCODONE (ROXICODONE) 5 MG immediate release tablet Take 1 tablet (5 mg total) by mouth every 4 (four) hours as needed for severe pain or breakthrough pain. 30 tablet 0   pantoprazole (PROTONIX)  40 MG tablet Take 1 tablet (40 mg total) by mouth 2 (two) times daily. 60 tablet 2   pregabalin (LYRICA) 50 MG capsule Take 1 cap by mouth at night for 7 nights then take 1 cap by mouth in the morning and 1 at night. (Patient not taking: Reported on 03/17/2022) 30 capsule 1   ranolazine (RANEXA) 500 MG 12 hr tablet TAKE 1 TABLET(500 MG) BY MOUTH TWICE DAILY (Patient not taking: Reported on 03/17/2022) 60 tablet 11   rosuvastatin (CRESTOR) 5 MG tablet Take 1 tablet (5 mg total) by mouth daily at 6 PM. 30 tablet 1   senna-docusate (SENOKOT-S) 8.6-50 MG tablet Take 1 tablet by mouth at bedtime as needed for mild constipation or moderate constipation. (Patient not taking: Reported on 03/17/2022) 20 tablet 0   traZODone (DESYREL) 50 MG tablet Take 100 mg by mouth at bedtime as needed for sleep.     No current facility-administered medications for this visit.   No results found.  Review of Systems:   A ROS was performed including pertinent positives and negatives as documented in the HPI.   Musculoskeletal Exam:    There were no vitals taken for this visit.  Right incision is well-appearing without erythema or drainage.  In the supine position she has passively able to elevate to 90 degrees and then coached she is able to actively elevate to 90 degrees.  External rotation at the side is to 20 degrees actively.  Able to flex and extend at the right elbow.  Sensation is intact distally with 2+ radial pulse  Imaging:    3 views right shoulder: Status post right shoulder reverse shoulder arthroplasty without evidence of hardware  complication  I personally reviewed and interpreted the radiographs.   Assessment:   76 year old female 2 weeks status post right shoulder reverse shoulder arthroplasty overall doing well.  At this time she was advised and coached on active range of motion exercises as well as any relevant limitations.  I will plan to see her back in 2 weeks for additional range of motion check.  Plan :    -Return to clinic in 2 weeks for reassessment      I personally saw and evaluated the patient, and participated in the management and treatment plan.  Vanetta Mulders, MD Attending Physician, Orthopedic Surgery  This document was dictated using Dragon voice recognition software. A reasonable attempt at proof reading has been made to minimize errors.

## 2022-05-02 ENCOUNTER — Ambulatory Visit (HOSPITAL_BASED_OUTPATIENT_CLINIC_OR_DEPARTMENT_OTHER): Payer: Medicare Other | Admitting: Physical Therapy

## 2022-05-02 ENCOUNTER — Encounter (HOSPITAL_BASED_OUTPATIENT_CLINIC_OR_DEPARTMENT_OTHER): Payer: Self-pay | Admitting: Physical Therapy

## 2022-05-02 DIAGNOSIS — R293 Abnormal posture: Secondary | ICD-10-CM | POA: Diagnosis not present

## 2022-05-02 DIAGNOSIS — M25611 Stiffness of right shoulder, not elsewhere classified: Secondary | ICD-10-CM | POA: Diagnosis not present

## 2022-05-02 DIAGNOSIS — M25531 Pain in right wrist: Secondary | ICD-10-CM | POA: Diagnosis not present

## 2022-05-02 DIAGNOSIS — M6281 Muscle weakness (generalized): Secondary | ICD-10-CM | POA: Diagnosis not present

## 2022-05-02 DIAGNOSIS — M25511 Pain in right shoulder: Secondary | ICD-10-CM

## 2022-05-02 DIAGNOSIS — M12811 Other specific arthropathies, not elsewhere classified, right shoulder: Secondary | ICD-10-CM | POA: Diagnosis not present

## 2022-05-02 NOTE — Therapy (Signed)
OUTPATIENT PHYSICAL THERAPY SHOULDER TREATMENT   Patient Name: [name] MRN: [id number] DOB:Jun 16, 1946, 76 y.o., female Today's Date: 05/02/2022   PT End of Session - 05/02/22 0933     Visit Number 4    Number of Visits 22    Date for PT Re-Evaluation 06/20/22    Authorization Type [employer] MCR    Progress Note Due on Visit 10    PT Start Time 0930    PT Stop Time 1014    PT Time Calculation (min) 44 min    Activity Tolerance Patient tolerated treatment well    Behavior During Therapy WFL for tasks assessed/performed                Past Medical History:  Diagnosis Date   Anemia    Anginal pain ([place])    last cp was last year   Anxiety    Arthritis    Blood transfusion    Chest pain 02/04/2016   Coronary artery disease    Coronary artery spasm, wth continued episodes of chest pain.  09/26/2013   Coronary vasospasm (H[facility])    Diabetes mellitus without complication (HCC)    type II   GERD (gastroesophageal reflux disease)    Headache    Hiatal hernia    Hypercholesteremia    Hypertension    NSTEMI (non-ST elevated myocardial infarction) ([place]) not sure   NSVT (nonsustained ventricular tachycardia) ([place]) 09/26/2013   Panic attack    Past Surgical History:  Procedure Laterality Date   ABDOMINAL HYSTERECTOMY     PARTIAL HYSTERECTOMY   ACDF N/A    C5-6 ACDF Dr. Sherwood Gambler   breast     right, tumor benign   BREAST EXCISIONAL BIOPSY Right    1961 (age 37) benign   CARDIAC CATHETERIZATION  2014   CARDIAC CATHETERIZATION  2012   CARDIAC CATHETERIZATION N/A 07/22/2016   Procedure: Left Heart Cath and Coronary Angiography;  Surgeon: Belva Crome, MD;  Location: [place] [facility];  Service: Cardiovascular;  Laterality: N/A;   cardiac stent  2012   to RCA   CHOLECYSTECTOMY N/A 10/02/2017   Procedure: LAPAROSCOPIC CHOLECYSTECTOMY;  Surgeon: Ralene Ok, MD;  Location: [place];  Service: General;  Laterality: N/A;   CORONARY ANGIOPLASTY     ESOPHAGEAL MANOMETRY  N/A 10/29/2016   Procedure: ESOPHAGEAL MANOMETRY (EM);  Surgeon: Garlan Fair, MD;  [facility];  Service: Endoscopy;  Laterality: N/A;   ESOPHAGOGASTRODUODENOSCOPY (EGD) WITH PROPOFOL N/A 10/28/2016   Procedure: ESOPHAGOGASTRODUODENOSCOPY (EGD) WITH PROPOFOL;  Surgeon: Garlan Fair, MD;  [facility]: [facility];  Service: Endoscopy;  Laterality: N/A;   EXCISION OF SKIN TAG N/A 07/29/2017   Procedure: EXCISION OF PERIANAL  SKIN TAG;  Surgeon: Ralene Ok, MD;  Location: [place];  Service: General;  Laterality: N/A;   KNEE SURGERY  left   left ear surgery     for bad cut   LEFT HEART CATHETERIZATION WITH CORONARY ANGIOGRAM N/A 02/22/2013   Procedure: LEFT HEART CATHETERIZATION WITH CORONARY ANGIOGRAM;  Surgeon: [name], MD;  Location[facility];  Service: Cardiovascular;  Laterality: N/A;   REVERSE SHOULDER ARTHROPLASTY Right 04/07/2022   Procedure: RIGHT SHOULDER REVERSE SHOULDER ARTHROPLASTY;  Surgeon: Vanetta Mulders, MD;  Location: [place];  Service: Orthopedics;  Laterality: Right;   SHOULDER SURGERY Bilateral    rotator cuff   TOE SURGERY Left    toe surgery   Patient Active Problem List   Diagnosis Date Noted   Rotator cuff  arthropathy of right shoulder    Carpal tunnel syndrome, right upper limb 01/03/2022   Chest pain 06/21/2021   Variant angina ([place]) 01/24/2017   Precordial chest pain 01/23/2017   Headache 01/23/2017   S/P angioplasty with stent 01/23/2017   Coronary artery disease 11/24/2014   Syncope 10/22/2014   Hyperglycemia 04/20/2014   H/O hiatal hernia 04/19/2014   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) 09/26/2013   HTN (hypertension) 12/08/2011   GERD (gastroesophageal reflux disease) 12/08/2011   Depression 12/08/2011   Dyslipidemia 12/08/2011     REFERRING PROVIDER: Sammuel Hines, MD  REFERRING DIAG: s/p Rt reverse TSA + biceps tenodesis, without subscap repair    THERAPY DIAG:  Acute pain of right shoulder  Stiffness  of right shoulder, not elsewhere classified  Rationale for Evaluation and Treatment Rehabilitation  ONSET DATE: DOS 04/07/22  SUBJECTIVE:                                                                                                                                                                                      SUBJECTIVE STATEMENT: The pain is not bad, the cold makes me stiff.   PERTINENT HISTORY: H/o bil RCR  PAIN:  Are you having pain? Yes: NPRS scale: 3/10 Pain location: Rt shoulder Pain description: sore, achey Aggravating factors: constant since block wore off Relieving factors: pain meds  PRECAUTIONS: Shoulder, reverse TSA without subscap repair  WEIGHT BEARING RESTRICTIONS Yes NWB UE  FALLS:  Has patient fallen in last 6 months? No  LIVING ENVIRONMENT: Lives with: lives with their spouse No stairs  OCCUPATION: Not working  PLOF: Independent  PATIENT GOALS the use of my arm and hand back, gardening  OBJECTIVE:    PATIENT SURVEYS:  FOTO 4  COGNITION:  Overall cognitive status: Within functional limits for tasks assessed     SENSATION: WFL  POSTURE: Rounded shoulder as expected in sling, elevation of Rt shoulder due to guarding, slouched in seated  UPPER EXTREMITY ROM:   Passive ROM Right eval   Shoulder flexion 35   Shoulder extension    Shoulder abduction    Shoulder adduction    Shoulder internal rotation    Shoulder external rotation    Elbow flexion    Elbow extension    (Blank rows = not tested)  UPPER EXTREMITY MMT:  MMT    Shoulder flexion    Shoulder extension    Shoulder abduction    Shoulder adduction    Shoulder internal rotation    Shoulder external rotation    Middle trapezius    Lower trapezius    Elbow flexion    Elbow extension  Wrist flexion    Wrist extension    Wrist ulnar deviation    Wrist radial deviation    Wrist pronation    Wrist supination    Grip strength (lbs)    (Blank rows = not  tested)   PALPATION:  Spasm in Rt upper trap ane levator, very guarded in motion   TODAY'S TREATMENT:   9/22 MANUAL: PROM flexion Supine wand flexion to 90 Isometric shoulder flexion, abd, ext Standing biceps curl- fwd reach- row-extend Standing shoulder abd with elbows at 90 Seated flexion on roller- bil UE, scaption Rt UE only Seated scap retraction, +cervical rotation     9/11 MANUAL: STM Rt upper trap, passive ROM as allowed per protocol Self AAROM flexion in supine- cues for shoulder depression & scap retraciton in between reps PT resisted GHJ fwd- back Scapular retraction with tactile cuing Shoulder isometrics at wall: ext, ER, abd, IR    PATIENT EDUCATION: Education details: Anatomy of condition, POC, HEP, exercise form/rationale, sling wear Person educated: Patient and Spouse Education method: Explanation, Demonstration, Tactile cues, Verbal cues, and Handouts Education comprehension: verbalized understanding, returned demonstration, verbal cues required, tactile cues required, and needs further education   HOME EXERCISE PROGRAM: [id number]   Out of sling multiple times daily for exercises.   ASSESSMENT:  CLINICAL IMPRESSION: Pt is 25 days s/p rev TSA without subscap repair.   Standing abd against gravity to 52 deg, passive flexion on roller to 100 deg today. Pt reported pulling without pinching.     OBJECTIVE IMPAIRMENTS decreased activity tolerance, decreased knowledge of condition, decreased ROM, decreased strength, increased muscle spasms, impaired flexibility, impaired UE functional use, improper body mechanics, postural dysfunction, and pain.   ACTIVITY LIMITATIONS carrying, lifting, sleeping, bed mobility, bathing, dressing, reach over head, and hygiene/grooming  PARTICIPATION LIMITATIONS: meal prep, cleaning, laundry, driving, shopping, and yard work  PERSONAL FACTORS Past/current experiences and see PMH  are also affecting patient's functional  outcome.   REHAB POTENTIAL: Good  CLINICAL DECISION MAKING: Stable/uncomplicated  EVALUATION COMPLEXITY: Low   GOALS: Goals reviewed with patient? Yes  SHORT TERM GOALS: Target date: 05/09/2022   AROM flexion to 90 without incr pain and good scap control Baseline:see chart Goal status: INITIAL  2.  ER in scapular plane to 30 deg Baseline: see chart Goal status: INITIAL  3.  Pain levels <=3/10 Baseline: see subj reports Goal status: INITIAL  4.  D/c sling Baseline: as tolerated at 3 wk Goal status: INITIAL   LONG TERM GOALS: Target date: 05/23/2022    1.  Pt will meet FOTO goal Baseline: see above Goal status: INITIAL Target date: 06/20/22  2.  AROM flexion to 130 without incr pain and good scap control Baseline:  Goal status: INITIAL Target date: 05/23/2022   3.  Use of arm for ADLs pain <=3/10 Baseline:  Goal status: INITIAL Target date: 05/23/2022   4.  Initiate functional IR behind back without incr pain Baseline: can begin at 6 weeks per post op protocol Goal status: INITIAL Target date: 05/23/2022     PLAN: PT FREQUENCY: 1-2x/week  PT DURATION: 10 weeks  PLANNED INTERVENTIONS: Therapeutic exercises, Therapeutic activity, Neuromuscular re-education, Balance training, Gait training, Patient/Family education, Self Care, Joint mobilization, Aquatic Therapy, Dry Needling, Electrical stimulation, Spinal mobilization, Cryotherapy, Moist heat, scar mobilization, Taping, Vasopneumatic device, Manual therapy, and Re-evaluation  PLAN FOR NEXT SESSION: continue per protocol   Leaann Nevils C. Hailyn Zarr PT, DPT 05/02/22 10:14 AM

## 2022-05-05 ENCOUNTER — Ambulatory Visit (INDEPENDENT_AMBULATORY_CARE_PROVIDER_SITE_OTHER): Payer: Medicare Other | Admitting: Orthopaedic Surgery

## 2022-05-05 DIAGNOSIS — Z9889 Other specified postprocedural states: Secondary | ICD-10-CM

## 2022-05-05 NOTE — Progress Notes (Signed)
Post Operative Evaluation    Procedure/Date of Surgery: Right shoulder reverse shoulder arthroplasty 04/07/22  Interval History:   [name] presents today for right shoulder follow-up.  She is here today for further assessment.  She is progressing significantly with physical therapy and her pain has become much more manageable at today's visit.  Overall she is doing quite well.  She is finished her anticoagulation.   PMH/PSH/Family History/Social History/Meds/Allergies:    Past Medical History:  Diagnosis Date   Anemia    Anginal pain ([employer])    last cp was last year   Anxiety    Arthritis    Blood transfusion    Chest pain 02/04/2016   Coronary artery disease    Coronary artery spasm, wth continued episodes of chest pain.  09/26/2013   Coronary vasospasm (HCC)    Diabetes mellitus without complication (HCC)    type II   GERD (gastroesophageal reflux disease)    Headache    Hiatal hernia    Hypercholesteremia    Hypertension    NSTEMI (non-ST elevated myocardial infarction) ([place]) not sure   NSVT (nonsustained ventricular tachycardia) ([place]) 09/26/2013   Panic attack    Past Surgical History:  Procedure Laterality Date   ABDOMINAL HYSTERECTOMY     PARTIAL HYSTERECTOMY   ACDF N/A    C5-6 ACDF Dr. Sherwood Gambler   breast     right, tumor benign   BREAST EXCISIONAL BIOPSY Right    1961 (age 71) benign   CARDIAC CATHETERIZATION  2014   CARDIAC CATHETERIZATION  2012   CARDIAC CATHETERIZATION N/A 07/22/2016   Procedure: Left Heart Cath and Coronary Angiography;  Surgeon: Belva Crome, MD;  Location: [place] [facility];  Service: Cardiovascular;  Laterality: N/A;   cardiac stent  2012   to RCA   CHOLECYSTECTOMY N/A 10/02/2017   Procedure: LAPAROSCOPIC CHOLECYSTECTOMY;  Surgeon: Ralene Ok, MD;  Location: [place];  Service: General;  Laterality: N/A;   CORONARY ANGIOPLASTY     ESOPHAGEAL MANOMETRY N/A 10/29/2016   Procedure: ESOPHAGEAL MANOMETRY  (EM);  Surgeon: Garlan Fair, MD;  [facility];  Service: Endoscopy;  Laterality: N/A;   ESOPHAGOGASTRODUODENOSCOPY (EGD) WITH PROPOFOL N/A 10/28/2016   Procedure: ESOPHAGOGASTRODUODENOSCOPY (EGD) WITH PROPOFOL;  Surgeon: Garlan Fair, MD;  [facility]: [facility];  Service: Endoscopy;  Laterality: N/A;   EXCISION OF SKIN TAG N/A 07/29/2017   Procedure: EXCISION OF PERIANAL  SKIN TAG;  Surgeon: Ralene Ok, MD;  Location: [place];  Service: General;  Laterality: N/A;   KNEE SURGERY  left   left ear surgery     for bad cut   LEFT HEART CATHETERIZATION WITH CORONARY ANGIOGRAM N/A 02/22/2013   Procedure: LEFT HEART CATHETERIZATION WITH CORONARY ANGIOGRAM;  Surgeon: [name], MD;  Location[facility];  Service: Cardiovascular;  Laterality: N/A;   REVERSE SHOULDER ARTHROPLASTY Right 04/07/2022   Procedure: RIGHT SHOULDER REVERSE SHOULDER ARTHROPLASTY;  Surgeon: Vanetta Mulders, MD;  Location: [place];  Service: Orthopedics;  Laterality: Right;   SHOULDER SURGERY Bilateral    rotator cuff   TOE SURGERY Left    toe surgery   Social History   Socioeconomic History   Marital status: Married    Spouse name: [name]   Number of children: 0   Years of education: Not on file  Highest education level: Not on file  Occupational History   Not on file  Tobacco Use   Smoking status: Every Day    Packs/day: 0.50    Years: 40.00    Total pack years: 20.00    Types: Cigarettes    Last attempt to quit: 01/10/2016    Years since quitting: 6.3   Smokeless tobacco: Never   Tobacco comments:    Pt reports she smokes a pack or less per week of cigarettes  Vaping Use   Vaping Use: Never used  Substance and Sexual Activity   Alcohol use: Yes    Alcohol/week: 1.0 standard drink of alcohol    Types: 1 Cans of beer per week    Comment: occasional beer   Drug use: No   Sexual activity: Never  Other Topics Concern   Not on file  Social History Narrative   Lives with husband.  Ambulates independently.   Social Determinants of Health   Financial Resource Strain: Not on file  Food Insecurity: No Food Insecurity (03/24/2022)   Hunger Vital Sign    Worried About Running Out of Food in the Last Year: Never true    Ran Out of Food in the Last Year: Never true  Transportation Needs: No Transportation Needs (03/24/2022)   PRAPARE - Hydrologist (Medical): No    Lack of Transportation (Non-Medical): No  Physical Activity: Not on file  Stress: Not on file  Social Connections: Not on file   Family History  Problem Relation Age of Onset   Heart failure Mother    Stroke Sister    Heart attack Other    No Known Allergies Current Outpatient Medications  Medication Sig Dispense Refill   ACCU-CHEK GUIDE test strip USE TO CHECK BLOOD SUGAR DAILY     Accu-Chek Softclix Lancets lancets daily.     amLODipine (NORVASC) 2.5 MG tablet Take 1 tablet (2.5 mg total) by mouth daily. 30 tablet 1   aspirin EC 325 MG tablet Take 1 tablet (325 mg total) by mouth daily. 30 tablet 0   buPROPion (ZYBAN) 150 MG 12 hr tablet Take 150 mg by mouth every morning.     dapagliflozin propanediol (FARXIGA) 10 MG TABS tablet Take 1 tablet (10 mg total) by mouth daily before breakfast. 30 tablet 11   HYDROcodone-acetaminophen (NORCO/VICODIN) 5-325 MG tablet Take 1 tablet by mouth every 6 (six) hours as needed for moderate pain. 30 tablet 0   HYDROcodone-acetaminophen (NORCO/VICODIN) 5-325 MG tablet Take 1 tablet by mouth every 6 (six) hours as needed for moderate pain. 30 tablet 0   metFORMIN (GLUCOPHAGE-XR) 500 MG 24 hr tablet Take 500 mg by mouth 2 (two) times daily.     methocarbamol (ROBAXIN) 500 MG tablet Take 1 tablet (500 mg total) by mouth 4 (four) times daily. 30 tablet 3   nitroGLYCERIN (NITROSTAT) 0.4 MG SL tablet PLACE 1 TABLET UNDER TONGUE AS NEEDED FOR CHEST PAIN EVERY 5 MINUTES UP TO 3 DOSES THEN SEEK MEDICAL ATTENTION. Please make overdue appt. 1st attempt  25 tablet 0   ondansetron (ZOFRAN-ODT) 8 MG disintegrating tablet Take 1 tablet (8 mg total) by mouth every 8 (eight) hours as needed for nausea or vomiting. 10 tablet 0   oxyCODONE (ROXICODONE) 5 MG immediate release tablet Take 1 tablet (5 mg total) by mouth every 4 (four) hours as needed for severe pain or breakthrough pain. 30 tablet 0   pantoprazole (PROTONIX) 40 MG tablet Take 1 tablet (  40 mg total) by mouth 2 (two) times daily. 60 tablet 2   pregabalin (LYRICA) 50 MG capsule Take 1 cap by mouth at night for 7 nights then take 1 cap by mouth in the morning and 1 at night. (Patient not taking: Reported on 03/17/2022) 30 capsule 1   ranolazine (RANEXA) 500 MG 12 hr tablet TAKE 1 TABLET(500 MG) BY MOUTH TWICE DAILY (Patient not taking: Reported on 03/17/2022) 60 tablet 11   rosuvastatin (CRESTOR) 5 MG tablet Take 1 tablet (5 mg total) by mouth daily at 6 PM. 30 tablet 1   senna-docusate (SENOKOT-S) 8.6-50 MG tablet Take 1 tablet by mouth at bedtime as needed for mild constipation or moderate constipation. (Patient not taking: Reported on 03/17/2022) 20 tablet 0   traZODone (DESYREL) 50 MG tablet Take 100 mg by mouth at bedtime as needed for sleep.     No current facility-administered medications for this visit.   No results found.  Review of Systems:   A ROS was performed including pertinent positives and negatives as documented in the HPI.   Musculoskeletal Exam:    There were no vitals taken for this visit.  Right incision is well-healed.  In the supine position she has passively able to elevate to 120 degrees and then coached she is able to actively elevate to 90 degrees.  External rotation at the side is to 30 degrees actively.  Able to flex and extend at the right elbow.  Sensation is intact distally with 2+ radial pulse  Imaging:    3 views right shoulder: Status post right shoulder reverse shoulder arthroplasty without evidence of hardware complication  I personally reviewed and  interpreted the radiographs.   Assessment:   76 year old female 4 weeks status post right shoulder reverse shoulder arthroplasty doing better at today's visit.  At this time I would like to reassess her in 4 weeks.  She will continue to work according to my reverse shoulder arthroplasty rehab protocol.  Plan :    -Return to clinic in 4 weeks      I personally saw and evaluated the patient, and participated in the management and treatment plan.  Vanetta Mulders, MD Attending Physician, Orthopedic Surgery  This document was dictated using Dragon voice recognition software. A reasonable attempt at proof reading has been made to minimize errors.

## 2022-05-07 ENCOUNTER — Ambulatory Visit (HOSPITAL_BASED_OUTPATIENT_CLINIC_OR_DEPARTMENT_OTHER): Payer: Medicare Other | Admitting: Physical Therapy

## 2022-05-07 ENCOUNTER — Encounter (HOSPITAL_BASED_OUTPATIENT_CLINIC_OR_DEPARTMENT_OTHER): Payer: Self-pay | Admitting: Physical Therapy

## 2022-05-07 DIAGNOSIS — M12811 Other specific arthropathies, not elsewhere classified, right shoulder: Secondary | ICD-10-CM | POA: Diagnosis not present

## 2022-05-07 DIAGNOSIS — M25611 Stiffness of right shoulder, not elsewhere classified: Secondary | ICD-10-CM | POA: Diagnosis not present

## 2022-05-07 DIAGNOSIS — M25531 Pain in right wrist: Secondary | ICD-10-CM | POA: Diagnosis not present

## 2022-05-07 DIAGNOSIS — M25511 Pain in right shoulder: Secondary | ICD-10-CM | POA: Diagnosis not present

## 2022-05-07 DIAGNOSIS — M6281 Muscle weakness (generalized): Secondary | ICD-10-CM | POA: Diagnosis not present

## 2022-05-07 DIAGNOSIS — R293 Abnormal posture: Secondary | ICD-10-CM | POA: Diagnosis not present

## 2022-05-07 NOTE — Therapy (Signed)
OUTPATIENT PHYSICAL THERAPY SHOULDER TREATMENT   Patient Name: [name] MRN: [id number] DOB:February 16, 1946, 76 y.o., female Today's Date: 05/07/2022   PT End of Session - 05/07/22 1239     Visit Number 5    Number of Visits 22    Date for PT Re-Evaluation 06/20/22    Authorization Type U[employer] MCR    Progress Note Due on Visit 10    PT Start Time 1237    PT Stop Time 1319    PT Time Calculation (min) 42 min    Activity Tolerance Patient tolerated treatment well    Behavior During Therapy WFL for tasks assessed/performed                Past Medical History:  Diagnosis Date   Anemia    Anginal pain ([place])    last cp was last year   Anxiety    Arthritis    Blood transfusion    Chest pain 02/04/2016   Coronary artery disease    Coronary artery spasm, wth continued episodes of chest pain.  09/26/2013   Coronary vasospasm ([facility])    Diabetes mellitus without complication (HCC)    type II   GERD (gastroesophageal reflux disease)    Headache    Hiatal hernia    Hypercholesteremia    Hypertension    NSTEMI (non-ST elevated myocardial infarction) ([place]) not sure   NSVT (nonsustained ventricular tachycardia) ([place]) 09/26/2013   Panic attack    Past Surgical History:  Procedure Laterality Date   ABDOMINAL HYSTERECTOMY     PARTIAL HYSTERECTOMY   ACDF N/A    C5-6 ACDF Dr. Sherwood Gambler   breast     right, tumor benign   BREAST EXCISIONAL BIOPSY Right    1961 (age 34) benign   CARDIAC CATHETERIZATION  2014   CARDIAC CATHETERIZATION  2012   CARDIAC CATHETERIZATION N/A 07/22/2016   Procedure: Left Heart Cath and Coronary Angiography;  Surgeon: Belva Crome, MD;  Location: [facility];  Service: Cardiovascular;  Laterality: N/A;   cardiac stent  2012   to RCA   CHOLECYSTECTOMY N/A 10/02/2017   Procedure: LAPAROSCOPIC CHOLECYSTECTOMY;  Surgeon: Ralene Ok, MD;  Location: [facility];  Service: General;  Laterality: N/A;   CORONARY ANGIOPLASTY     ESOPHAGEAL MANOMETRY  N/A 10/29/2016   Procedure: ESOPHAGEAL MANOMETRY (EM);  Surgeon: Garlan Fair, MD;  [facility]: [facility];  Service: Endoscopy;  Laterality: N/A;   ESOPHAGOGASTRODUODENOSCOPY (EGD) WITH PROPOFOL N/A 10/28/2016   Procedure: ESOPHAGOGASTRODUODENOSCOPY (EGD) WITH PROPOFOL;  Surgeon: Garlan Fair, MD;  Location: [facility];  Service: Endoscopy;  Laterality: N/A;   EXCISION OF SKIN TAG N/A 07/29/2017   Procedure: EXCISION OF PERIANAL  SKIN TAG;  Surgeon: Ralene Ok, MD;  Location: [facility];  Service: General;  Laterality: N/A;   KNEE SURGERY  left   left ear surgery     for bad cut   LEFT HEART CATHETERIZATION WITH CORONARY ANGIOGRAM N/A 02/22/2013   Procedure: LEFT HEART CATHETERIZATION WITH CORONARY ANGIOGRAM;  Surgeon: [name], MD;  Location: [facility];  Service: Cardiovascular;  Laterality: N/A;   REVERSE SHOULDER ARTHROPLASTY Right 04/07/2022   Procedure: RIGHT SHOULDER REVERSE SHOULDER ARTHROPLASTY;  Surgeon: Vanetta Mulders, MD;  Location: [facility];  Service: Orthopedics;  Laterality: Right;   SHOULDER SURGERY Bilateral    rotator cuff   TOE SURGERY Left    toe surgery   Patient Active Problem List   Diagnosis Date Noted   Rotator cuff  arthropathy of right shoulder    Carpal tunnel syndrome, right upper limb 01/03/2022   Chest pain 06/21/2021   Variant angina ([place]) 01/24/2017   Precordial chest pain 01/23/2017   Headache 01/23/2017   S/P angioplasty with stent 01/23/2017   Coronary artery disease 11/24/2014   Syncope 10/22/2014   Hyperglycemia 04/20/2014   H/O hiatal hernia 04/19/2014   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    Coronary artery vasospasm ([place]) 09/26/2013   HTN (hypertension) 12/08/2011   GERD (gastroesophageal reflux disease) 12/08/2011   Depression 12/08/2011   Dyslipidemia 12/08/2011     REFERRING PROVIDER: Sammuel Hines, MD  REFERRING DIAG: s/p Rt reverse TSA + biceps tenodesis, without subscap repair    THERAPY DIAG:  Acute pain of right shoulder  Stiffness  of right shoulder, not elsewhere classified  Pain in right wrist  Rationale for Evaluation and Treatment Rehabilitation  ONSET DATE: DOS 04/07/22  SUBJECTIVE:                                                                                                                                                                                      SUBJECTIVE STATEMENT: So stiff with this cold. He took me out of the sling. Wrist and hand are stiff.   PERTINENT HISTORY: H/o bil RCR  PAIN:  Are you having pain? Yes: NPRS scale: 2/10 Pain location: Rt shoulder Pain description: sore, achey Aggravating factors: constant since block wore off Relieving factors: pain meds  PRECAUTIONS: Shoulder, reverse TSA without subscap repair  WEIGHT BEARING RESTRICTIONS Yes NWB UE  FALLS:  Has patient fallen in last 6 months? No  LIVING ENVIRONMENT: Lives with: lives with their spouse No stairs  OCCUPATION: Not working  PLOF: Independent  PATIENT GOALS the use of my arm and hand back, gardening  OBJECTIVE:    PATIENT SURVEYS:  FOTO 4  COGNITION:  Overall cognitive status: Within functional limits for tasks assessed     SENSATION: WFL  POSTURE: Rounded shoulder as expected in sling, elevation of Rt shoulder due to guarding, slouched in seated  UPPER EXTREMITY ROM:   Passive ROM Right eval RIGHT 9/27  Shoulder flexion 35 120  Shoulder extension    Shoulder abduction  85  Shoulder adduction    Shoulder internal rotation    Shoulder external rotation    Elbow flexion    Elbow extension    (Blank rows = not tested)  UPPER EXTREMITY MMT:  MMT    Shoulder flexion    Shoulder extension    Shoulder abduction    Shoulder adduction    Shoulder internal rotation    Shoulder external rotation    Middle trapezius    Lower  trapezius    Elbow flexion    Elbow extension    Wrist flexion    Wrist extension    Wrist ulnar deviation    Wrist radial deviation    Wrist pronation     Wrist supination    Grip strength (lbs)    (Blank rows = not tested)   PALPATION:  Spasm in Rt upper trap ane levator, very guarded in motion   TODAY'S TREATMENT:   9/27 MANUAL: PASSIVE ROM flexion, abd; sidelying STM to upper trap, scapular distraction & rhomboids Supine- shoulder placed at 90 flexion & asked her to hold Sidelying: ER, abd (AAROM) Prone scap retraction, +extension Seated upper trap stretch Seated fwd punch  9/22 MANUAL: PROM flexion Supine wand flexion to 90 Isometric shoulder flexion, abd, ext Standing biceps curl- fwd reach- row-extend Standing shoulder abd with elbows at 90 Seated flexion on roller- bil UE, scaption Rt UE only Seated scap retraction, +cervical rotation     9/11 MANUAL: STM Rt upper trap, passive ROM as allowed per protocol Self AAROM flexion in supine- cues for shoulder depression & scap retraciton in between reps PT resisted GHJ fwd- back Scapular retraction with tactile cuing Shoulder isometrics at wall: ext, ER, abd, IR    PATIENT EDUCATION: Education details: Anatomy of condition, POC, HEP, exercise form/rationale, sling wear Person educated: Patient and Spouse Education method: Explanation, Demonstration, Tactile cues, Verbal cues, and Handouts Education comprehension: verbalized understanding, returned demonstration, verbal cues required, tactile cues required, and needs further education   HOME EXERCISE PROGRAM: [email or web address]    ASSESSMENT:  CLINICAL IMPRESSION: Pt is 30 days s/p rev TSA without subscap repair.   Continues to progress well, tactile cues required for scapular mobility in UE motions.     OBJECTIVE IMPAIRMENTS decreased activity tolerance, decreased knowledge of condition, decreased ROM, decreased strength, increased muscle spasms, impaired flexibility, impaired UE functional use, improper body mechanics, postural dysfunction, and pain.   ACTIVITY LIMITATIONS carrying, lifting, sleeping, bed mobility,  bathing, dressing, reach over head, and hygiene/grooming  PARTICIPATION LIMITATIONS: meal prep, cleaning, laundry, driving, shopping, and yard work  PERSONAL FACTORS Past/current experiences and see PMH  are also affecting patient's functional outcome.   REHAB POTENTIAL: Good  CLINICAL DECISION MAKING: Stable/uncomplicated  EVALUATION COMPLEXITY: Low   GOALS: Goals reviewed with patient? Yes  SHORT TERM GOALS: Target date: 05/09/2022   AROM flexion to 90 without incr pain and good scap control Baseline:see chart Goal status: INITIAL  2.  ER in scapular plane to 30 deg Baseline: see chart Goal status: INITIAL  3.  Pain levels <=3/10 Baseline: see subj reports Goal status: INITIAL  4.  D/c sling Baseline: as tolerated at 3 wk Goal status: INITIAL   LONG TERM GOALS: Target date: 05/23/2022    1.  Pt will meet FOTO goal Baseline: see above Goal status: INITIAL Target date: 06/20/22  2.  AROM flexion to 130 without incr pain and good scap control Baseline:  Goal status: INITIAL Target date: 05/23/2022   3.  Use of arm for ADLs pain <=3/10 Baseline:  Goal status: INITIAL Target date: 05/23/2022   4.  Initiate functional IR behind back without incr pain Baseline: can begin at 6 weeks per post op protocol Goal status: INITIAL Target date: 05/23/2022     PLAN: PT FREQUENCY: 1-2x/week  PT DURATION: 10 weeks  PLANNED INTERVENTIONS: Therapeutic exercises, Therapeutic activity, Neuromuscular re-education, Balance training, Gait training, Patient/Family education, Self Care, Joint mobilization, Aquatic Therapy, Dry Needling, Electrical stimulation, Spinal mobilization,  Cryotherapy, Moist heat, scar mobilization, Taping, Vasopneumatic device, Manual therapy, and Re-evaluation  PLAN FOR NEXT SESSION: continue per protocol   Jashua Knaak C. Marquerite Forsman PT, DPT 05/07/22 2:20 PM

## 2022-05-09 ENCOUNTER — Encounter (HOSPITAL_BASED_OUTPATIENT_CLINIC_OR_DEPARTMENT_OTHER): Payer: Self-pay | Admitting: Physical Therapy

## 2022-05-09 ENCOUNTER — Ambulatory Visit (HOSPITAL_BASED_OUTPATIENT_CLINIC_OR_DEPARTMENT_OTHER): Payer: Medicare Other | Admitting: Physical Therapy

## 2022-05-09 DIAGNOSIS — M25511 Pain in right shoulder: Secondary | ICD-10-CM | POA: Diagnosis not present

## 2022-05-09 DIAGNOSIS — M25611 Stiffness of right shoulder, not elsewhere classified: Secondary | ICD-10-CM

## 2022-05-09 DIAGNOSIS — R293 Abnormal posture: Secondary | ICD-10-CM | POA: Diagnosis not present

## 2022-05-09 DIAGNOSIS — M12811 Other specific arthropathies, not elsewhere classified, right shoulder: Secondary | ICD-10-CM | POA: Diagnosis not present

## 2022-05-09 DIAGNOSIS — M6281 Muscle weakness (generalized): Secondary | ICD-10-CM | POA: Diagnosis not present

## 2022-05-09 DIAGNOSIS — M25531 Pain in right wrist: Secondary | ICD-10-CM | POA: Diagnosis not present

## 2022-05-09 NOTE — Therapy (Signed)
OUTPATIENT PHYSICAL THERAPY SHOULDER TREATMENT   Patient Name: [name] MRN: [id number] DOB:February 12, 1946, 76 y.o., female Today's Date: 05/09/2022   PT End of Session - 05/09/22 1055     Visit Number 6    Number of Visits 22    Date for PT Re-Evaluation 06/20/22    Authorization Type UHC MCR    Progress Note Due on Visit 10    PT Start Time 1024    PT Stop Time 1105    PT Time Calculation (min) 41 min    Activity Tolerance Patient tolerated treatment well    Behavior During Therapy WFL for tasks assessed/performed                 Past Medical History:  Diagnosis Date   Anemia    Anginal pain ([place])    last cp was last year   Anxiety    Arthritis    Blood transfusion    Chest pain 02/04/2016   Coronary artery disease    Coronary artery spasm, wth continued episodes of chest pain.  09/26/2013   Coronary vasospasm (H[facility])    Diabetes mellitus without complication (HCC)    type II   GERD (gastroesophageal reflux disease)    Headache    Hiatal hernia    Hypercholesteremia    Hypertension    NSTEMI (non-ST elevated myocardial infarction) ([place]) not sure   NSVT (nonsustained ventricular tachycardia) ([place]) 09/26/2013   Panic attack    Past Surgical History:  Procedure Laterality Date   ABDOMINAL HYSTERECTOMY     PARTIAL HYSTERECTOMY   ACDF N/A    C5-6 ACDF Dr. Sherwood Gambler   breast     right, tumor benign   BREAST EXCISIONAL BIOPSY Right    1961 (age 58) benign   CARDIAC CATHETERIZATION  2014   CARDIAC CATHETERIZATION  2012   CARDIAC CATHETERIZATION N/A 07/22/2016   Procedure: Left Heart Cath and Coronary Angiography;  Surgeon: Belva Crome, MD;  Location: [facility];  Service: Cardiovascular;  Laterality: N/A;   cardiac stent  2012   to RCA   CHOLECYSTECTOMY N/A 10/02/2017   Procedure: LAPAROSCOPIC CHOLECYSTECTOMY;  Surgeon: Ralene Ok, MD;  Location: [place];  Service: General;  Laterality: N/A;   CORONARY ANGIOPLASTY     ESOPHAGEAL MANOMETRY  N/A 10/29/2016   Procedure: ESOPHAGEAL MANOMETRY (EM);  Surgeon: Garlan Fair, MD;  [facility];  Service: Endoscopy;  Laterality: N/A;   ESOPHAGOGASTRODUODENOSCOPY (EGD) WITH PROPOFOL N/A 10/28/2016   Procedure: ESOPHAGOGASTRODUODENOSCOPY (EGD) WITH PROPOFOL;  Surgeon: Garlan Fair, MD;  [facility];  Service: Endoscopy;  Laterality: N/A;   EXCISION OF SKIN TAG N/A 07/29/2017   Procedure: EXCISION OF PERIANAL  SKIN TAG;  Surgeon: Ralene Ok, MD;  Location: [place];  Service: General;  Laterality: N/A;   KNEE SURGERY  left   left ear surgery     for bad cut   LEFT HEART CATHETERIZATION WITH CORONARY ANGIOGRAM N/A 02/22/2013   Procedure: LEFT HEART CATHETERIZATION WITH CORONARY ANGIOGRAM;  Surgeon: [name], MD;  Location: [facility];  Service: Cardiovascular;  Laterality: N/A;   REVERSE SHOULDER ARTHROPLASTY Right 04/07/2022   Procedure: RIGHT SHOULDER REVERSE SHOULDER ARTHROPLASTY;  Surgeon: Vanetta Mulders, MD;  Location: [place];  Service: Orthopedics;  Laterality: Right;   SHOULDER SURGERY Bilateral    rotator cuff   TOE SURGERY Left    toe surgery   Patient Active Problem List   Diagnosis Date Noted   Rotator  cuff arthropathy of right shoulder    Carpal tunnel syndrome, right upper limb 01/03/2022   Chest pain 06/21/2021   Variant angina ([place]) 01/24/2017   Precordial chest pain 01/23/2017   Headache 01/23/2017   S/P angioplasty with stent 01/23/2017   Coronary artery disease 11/24/2014   Syncope 10/22/2014   Hyperglycemia 04/20/2014   H/O hiatal hernia 04/19/2014   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    Coronary artery vasospasm ([place]) 09/26/2013   HTN (hypertension) 12/08/2011   GERD (gastroesophageal reflux disease) 12/08/2011   Depression 12/08/2011   Dyslipidemia 12/08/2011     REFERRING PROVIDER: Sammuel Hines, MD  REFERRING DIAG: s/p Rt reverse TSA + biceps tenodesis, without subscap repair    THERAPY DIAG:  Acute pain of right shoulder  Stiffness  of right shoulder, not elsewhere classified  Muscle weakness (generalized)  Rationale for Evaluation and Treatment Rehabilitation  ONSET DATE: DOS 04/07/22  SUBJECTIVE:                                                                                                                                                                                      SUBJECTIVE STATEMENT: Pt is 4 weeks and 4 days s/p So stiff with this cold.  Pt reports she has stiffness in AM.  She also has swelling and stiffness in R hand.  Pt has been using putty for gripping.  Pt reports having soreness in R shoulder and R scapula and anterior shoulder pain after prior Rx.  Pt has been sleeping with sling.  Her shoulder has felt ok being out of sling.  Pt reports improved raising R UE though has tension and is limited.  Pt has expected limitations including with reaching and not performing household chores.  Pt reports compliance with HEP.  PERTINENT HISTORY: H/o bil RCR  PAIN:  Are you having pain? Yes: NPRS scale: 1/10 Pain location: Rt shoulder Pain description: nagging, achy Aggravating factors: constant since block wore off Relieving factors: pain meds  PRECAUTIONS: Shoulder, reverse TSA without subscap repair  WEIGHT BEARING RESTRICTIONS Yes NWB UE  FALLS:  Has patient fallen in last 6 months? No  LIVING ENVIRONMENT: Lives with: lives with their spouse No stairs  OCCUPATION: Not working  PLOF: Independent  PATIENT GOALS the use of my arm and hand back, gardening  OBJECTIVE:    TODAY'S TREATMENT:  Therapeutic Exercise: -Reviewed current function, pain level, response to prior Rx, and HEP compliance. -Pt received R shoulder PROM in flexion, scaption, and ER per protocol ranges w/n pt and tissue tolerance. -Pt performed:  Supine wand flexion 2x10 w/n protocol range   Supine wand ER 2x10 w/n protocol range  Supine fwd punch 2x10 AAROM  with PT assist 2x10  Supine- shoulder placed at 90 flexion &  asked her to hold 2x20 sec PT assisted with lowering arm after 20 sec  Submaximal isometrics in flex, abd, ext with 5 sec hold x 5 reps each    PATIENT EDUCATION: Education details: Anatomy of condition, POC, HEP, exercise form/rationale, sling wear Person educated: Patient and Spouse Education method: Explanation, Demonstration, Tactile cues, Verbal cues, and Handouts Education comprehension: verbalized understanding, returned demonstration, verbal cues required, tactile cues required, and needs further education   HOME EXERCISE PROGRAM: [email or web address]    ASSESSMENT:  CLINICAL IMPRESSION: Pt is progressing well with shoulder ROM per protocol.  PROM was performed w/n protocol ranges and she tolerated PROM well.  She was able to perform supine wand flexion with verbal, visual, and tactile cuing for correct form and had some pain with the lowering phase.  Pt required PT assist to perform supine forward punch.  She required cuing and instruction in correct form with exercises.  Pt responded well to Rx having no increased pain after Rx.  Pt should benefit from cont skilled PT services per protocol to address ongoing goals and to assist in restoring desired level of function.     OBJECTIVE IMPAIRMENTS decreased activity tolerance, decreased knowledge of condition, decreased ROM, decreased strength, increased muscle spasms, impaired flexibility, impaired UE functional use, improper body mechanics, postural dysfunction, and pain.   ACTIVITY LIMITATIONS carrying, lifting, sleeping, bed mobility, bathing, dressing, reach over head, and hygiene/grooming  PARTICIPATION LIMITATIONS: meal prep, cleaning, laundry, driving, shopping, and yard work  PERSONAL FACTORS Past/current experiences and see PMH  are also affecting patient's functional outcome.   REHAB POTENTIAL: Good  CLINICAL DECISION MAKING: Stable/uncomplicated  EVALUATION COMPLEXITY: Low   GOALS: Goals reviewed with patient? Yes  SHORT  TERM GOALS: Target date: 05/09/2022   AROM flexion to 90 without incr pain and good scap control Baseline:see chart Goal status: INITIAL  2.  ER in scapular plane to 30 deg Baseline: see chart Goal status: INITIAL  3.  Pain levels <=3/10 Baseline: see subj reports Goal status: INITIAL  4.  D/c sling Baseline: as tolerated at 3 wk Goal status: INITIAL   LONG TERM GOALS: Target date: 05/23/2022    1.  Pt will meet FOTO goal Baseline: see above Goal status: INITIAL Target date: 06/20/22  2.  AROM flexion to 130 without incr pain and good scap control Baseline:  Goal status: INITIAL Target date: 05/23/2022   3.  Use of arm for ADLs pain <=3/10 Baseline:  Goal status: INITIAL Target date: 05/23/2022   4.  Initiate functional IR behind back without incr pain Baseline: can begin at 6 weeks per post op protocol Goal status: INITIAL Target date: 05/23/2022     PLAN: PT FREQUENCY: 1-2x/week  PT DURATION: 10 weeks  PLANNED INTERVENTIONS: Therapeutic exercises, Therapeutic activity, Neuromuscular re-education, Balance training, Gait training, Patient/Family education, Self Care, Joint mobilization, Aquatic Therapy, Dry Needling, Electrical stimulation, Spinal mobilization, Cryotherapy, Moist heat, scar mobilization, Taping, Vasopneumatic device, Manual therapy, and Re-evaluation  PLAN FOR NEXT SESSION: continue per Dr. Eddie Dibbles reverse TSA protocol   Selinda Michaels III PT, DPT 05/09/22 11:17 AM

## 2022-05-12 DIAGNOSIS — L298 Other pruritus: Secondary | ICD-10-CM | POA: Diagnosis not present

## 2022-05-14 ENCOUNTER — Encounter (HOSPITAL_BASED_OUTPATIENT_CLINIC_OR_DEPARTMENT_OTHER): Payer: Self-pay | Admitting: Physical Therapy

## 2022-05-14 ENCOUNTER — Ambulatory Visit (HOSPITAL_BASED_OUTPATIENT_CLINIC_OR_DEPARTMENT_OTHER): Payer: Medicare Other | Attending: Orthopaedic Surgery | Admitting: Physical Therapy

## 2022-05-14 DIAGNOSIS — M6281 Muscle weakness (generalized): Secondary | ICD-10-CM

## 2022-05-14 DIAGNOSIS — M25511 Pain in right shoulder: Secondary | ICD-10-CM | POA: Diagnosis not present

## 2022-05-14 DIAGNOSIS — M25611 Stiffness of right shoulder, not elsewhere classified: Secondary | ICD-10-CM | POA: Diagnosis not present

## 2022-05-14 NOTE — Therapy (Signed)
OUTPATIENT PHYSICAL THERAPY SHOULDER TREATMENT   Patient Name: [name] MRN: [id number] DOB:June 30, 1946, 76 y.o., female Today's Date: 05/15/2022   PT End of Session - 05/14/22 1027     Visit Number 7    Number of Visits 22    Date for PT Re-Evaluation 06/20/22    Authorization Type U[employer] MCR    PT Start Time 1023    PT Stop Time 1104    PT Time Calculation (min) 41 min    Activity Tolerance Patient tolerated treatment well    Behavior During Therapy WFL for tasks assessed/performed                  Past Medical History:  Diagnosis Date   Anemia    Anginal pain ([place])    last cp was last year   Anxiety    Arthritis    Blood transfusion    Chest pain 02/04/2016   Coronary artery disease    Coronary artery spasm, wth continued episodes of chest pain.  09/26/2013   Coronary vasospasm ([facility])    Diabetes mellitus without complication (HCC)    type II   GERD (gastroesophageal reflux disease)    Headache    Hiatal hernia    Hypercholesteremia    Hypertension    NSTEMI (non-ST elevated myocardial infarction) ([place]) not sure   NSVT (nonsustained ventricular tachycardia) ([place]) 09/26/2013   Panic attack    Past Surgical History:  Procedure Laterality Date   ABDOMINAL HYSTERECTOMY     PARTIAL HYSTERECTOMY   ACDF N/A    C5-6 ACDF Dr. Sherwood Gambler   breast     right, tumor benign   BREAST EXCISIONAL BIOPSY Right    1961 (age 47) benign   CARDIAC CATHETERIZATION  2014   CARDIAC CATHETERIZATION  2012   CARDIAC CATHETERIZATION N/A 07/22/2016   Procedure: Left Heart Cath and Coronary Angiography;  Surgeon: Belva Crome, MD;  Location: [facility];  Service: Cardiovascular;  Laterality: N/A;   cardiac stent  2012   to RCA   CHOLECYSTECTOMY N/A 10/02/2017   Procedure: LAPAROSCOPIC CHOLECYSTECTOMY;  Surgeon: Ralene Ok, MD;  Location: [facility];  Service: General;  Laterality: N/A;   CORONARY ANGIOPLASTY     ESOPHAGEAL MANOMETRY N/A 10/29/2016   Procedure:  ESOPHAGEAL MANOMETRY (EM);  Surgeon: Garlan Fair, MD;  Location: [facility];  Service: Endoscopy;  Laterality: N/A;   ESOPHAGOGASTRODUODENOSCOPY (EGD) WITH PROPOFOL N/A 10/28/2016   Procedure: ESOPHAGOGASTRODUODENOSCOPY (EGD) WITH PROPOFOL;  Surgeon: Garlan Fair, MD;  [facility]: [facility];  Service: Endoscopy;  Laterality: N/A;   EXCISION OF SKIN TAG N/A 07/29/2017   Procedure: EXCISION OF PERIANAL  SKIN TAG;  Surgeon: Ralene Ok, MD;  Location: [facility];  Service: General;  Laterality: N/A;   KNEE SURGERY  left   left ear surgery     for bad cut   LEFT HEART CATHETERIZATION WITH CORONARY ANGIOGRAM N/A 02/22/2013   Procedure: LEFT HEART CATHETERIZATION WITH CORONARY ANGIOGRAM;  Surgeon: [name], MD;  Location: [facility];  Service: Cardiovascular;  Laterality: N/A;   REVERSE SHOULDER ARTHROPLASTY Right 04/07/2022   Procedure: RIGHT SHOULDER REVERSE SHOULDER ARTHROPLASTY;  Surgeon: Vanetta Mulders, MD;  Location: [facility];  Service: Orthopedics;  Laterality: Right;   SHOULDER SURGERY Bilateral    rotator cuff   TOE SURGERY Left    toe surgery   Patient Active Problem List   Diagnosis Date Noted   Rotator cuff arthropathy of right shoulder  Carpal tunnel syndrome, right upper limb 01/03/2022   Chest pain 06/21/2021   Variant angina ([place]) 01/24/2017   Precordial chest pain 01/23/2017   Headache 01/23/2017   S/P angioplasty with stent 01/23/2017   Coronary artery disease 11/24/2014   Syncope 10/22/2014   Hyperglycemia 04/20/2014   H/O hiatal hernia 04/19/2014   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    Coronary artery vasospasm ([place]) 09/26/2013   HTN (hypertension) 12/08/2011   GERD (gastroesophageal reflux disease) 12/08/2011   Depression 12/08/2011   Dyslipidemia 12/08/2011     REFERRING PROVIDER: Sammuel Hines, MD  REFERRING DIAG: s/p Rt reverse TSA + biceps tenodesis, without subscap repair    THERAPY DIAG:  Acute pain of right shoulder  Stiffness of right shoulder, not  elsewhere classified  Muscle weakness (generalized)  Rationale for Evaluation and Treatment Rehabilitation  ONSET DATE: DOS 04/07/22  SUBJECTIVE:                                                                                                                                                                                      SUBJECTIVE STATEMENT: Pt is 5 weeks and 2 days s/p R reverse TSA and biceps tenodesis.  Pt reports she has stiffness in AM.  Pt denies any adverse effects after prior Rx.  Pt c/o's of tightness in anterior shoulder.  Pt denies pain currently, just has stiffness.  Pt reports her wrist and hand hurt.  Pt had carpal tunnel release in July.    PERTINENT HISTORY: H/o bil RCR  PAIN:  Are you having pain? Yes: NPRS scale: 0/10 Pain location: Rt shoulder Pain description: stiffness Aggravating factors: constant since block wore off Relieving factors: pain meds  PRECAUTIONS: Shoulder, reverse TSA without subscap repair  WEIGHT BEARING RESTRICTIONS Yes NWB UE  FALLS:  Has patient fallen in last 6 months? No  LIVING ENVIRONMENT: Lives with: lives with their spouse No stairs  OCCUPATION: Not working  PLOF: Independent  PATIENT GOALS the use of my arm and hand back, gardening  OBJECTIVE:    TODAY'S TREATMENT:  Therapeutic Exercise: -Reviewed pain level, response to prior Rx, and HEP compliance. -Pt received R shoulder PROM in flexion, scaption, and ER per protocol ranges w/n pt and tissue tolerance. -Pt performed:  Supine wand flexion x10-12 and x5 reps w/n protocol range with R LE elevated on towels  Supine wand ER 2x10 w/n protocol range  Supine fwd punch 2x10 AAROM with PT assist 2x10  Supine wrist clasped flexion AAROM x10 reps  Supine- shoulder placed at 90 flexion & asked her to hold 2x20 sec PT assisted with lowering arm after 20 sec  Submaximal isometrics in flex, abd, ext with 5 sec  hold x 5 reps each  R shoulder ROM:  PROM/AAROM:   Flex:   127/121 deg ER:  29/28-30 deg    PATIENT EDUCATION: Education details: Anatomy of condition, POC, HEP, exercise form/rationale, sling wear Person educated: Patient and Spouse Education method: Explanation, Demonstration, Tactile cues, Verbal cues, and Handouts Education comprehension: verbalized understanding, returned demonstration, verbal cues required, tactile cues required, and needs further education   HOME EXERCISE PROGRAM: [email or web address]    ASSESSMENT:  CLINICAL IMPRESSION: Pt demonstrates good shoulder PROM and AAROM at this time in protocol.  PROM was performed w/n protocol ranges and she tolerated PROM well.  Pt does have tightness in PROM flexion which improves with increased reps.  Pt has pain and difficulty with the range against gravity in supine.  She also has some pain with lowering.  PT elevated R UE with towels in order for her to initiate supine wand flexion with improved comfort and ease.  She performs wrist clasped flexion AAROM with much improved ease and comfort compared to supine wand flexion.  Pt does require cuing and instruction for correct form and positioning with AAROM.  Pt required PT assist to perform supine forward punch.  Pt responded well to Rx having no pain after Rx.  Pt should benefit from cont skilled PT services per protocol to address ongoing goals and to assist in restoring desired level of function.    OBJECTIVE IMPAIRMENTS decreased activity tolerance, decreased knowledge of condition, decreased ROM, decreased strength, increased muscle spasms, impaired flexibility, impaired UE functional use, improper body mechanics, postural dysfunction, and pain.   ACTIVITY LIMITATIONS carrying, lifting, sleeping, bed mobility, bathing, dressing, reach over head, and hygiene/grooming  PARTICIPATION LIMITATIONS: meal prep, cleaning, laundry, driving, shopping, and yard work  PERSONAL FACTORS Past/current experiences and see PMH  are also affecting patient's  functional outcome.   REHAB POTENTIAL: Good  CLINICAL DECISION MAKING: Stable/uncomplicated  EVALUATION COMPLEXITY: Low   GOALS: Goals reviewed with patient? Yes  SHORT TERM GOALS: Target date: 05/09/2022   AROM flexion to 90 without incr pain and good scap control Baseline:see chart Goal status: INITIAL  2.  ER in scapular plane to 30 deg Baseline: see chart Goal status: INITIAL  3.  Pain levels <=3/10 Baseline: see subj reports Goal status: INITIAL  4.  D/c sling Baseline: as tolerated at 3 wk Goal status: INITIAL   LONG TERM GOALS: Target date: 05/23/2022    1.  Pt will meet FOTO goal Baseline: see above Goal status: INITIAL Target date: 06/20/22  2.  AROM flexion to 130 without incr pain and good scap control Baseline:  Goal status: INITIAL Target date: 05/23/2022   3.  Use of arm for ADLs pain <=3/10 Baseline:  Goal status: INITIAL Target date: 05/23/2022   4.  Initiate functional IR behind back without incr pain Baseline: can begin at 6 weeks per post op protocol Goal status: INITIAL Target date: 05/23/2022     PLAN: PT FREQUENCY: 1-2x/week  PT DURATION: 10 weeks  PLANNED INTERVENTIONS: Therapeutic exercises, Therapeutic activity, Neuromuscular re-education, Balance training, Gait training, Patient/Family education, Self Care, Joint mobilization, Aquatic Therapy, Dry Needling, Electrical stimulation, Spinal mobilization, Cryotherapy, Moist heat, scar mobilization, Taping, Vasopneumatic device, Manual therapy, and Re-evaluation  PLAN FOR NEXT SESSION: continue per Dr. Eddie Dibbles reverse TSA protocol   Selinda Michaels III PT, DPT 05/15/22 9:13 AM

## 2022-05-16 ENCOUNTER — Encounter (HOSPITAL_BASED_OUTPATIENT_CLINIC_OR_DEPARTMENT_OTHER): Payer: Medicare Other | Admitting: Physical Therapy

## 2022-05-21 ENCOUNTER — Ambulatory Visit (HOSPITAL_BASED_OUTPATIENT_CLINIC_OR_DEPARTMENT_OTHER): Payer: Medicare Other | Admitting: Physical Therapy

## 2022-05-23 ENCOUNTER — Ambulatory Visit (HOSPITAL_BASED_OUTPATIENT_CLINIC_OR_DEPARTMENT_OTHER): Payer: Medicare Other | Admitting: Physical Therapy

## 2022-05-23 ENCOUNTER — Encounter (HOSPITAL_BASED_OUTPATIENT_CLINIC_OR_DEPARTMENT_OTHER): Payer: Self-pay | Admitting: Physical Therapy

## 2022-05-23 DIAGNOSIS — M25611 Stiffness of right shoulder, not elsewhere classified: Secondary | ICD-10-CM | POA: Diagnosis not present

## 2022-05-23 DIAGNOSIS — M25511 Pain in right shoulder: Secondary | ICD-10-CM

## 2022-05-23 DIAGNOSIS — M6281 Muscle weakness (generalized): Secondary | ICD-10-CM | POA: Diagnosis not present

## 2022-05-23 NOTE — Therapy (Signed)
OUTPATIENT PHYSICAL THERAPY SHOULDER TREATMENT   Patient Name: [name] MRN: [id number] DOB:[birth year], 76 y.o., female Today's Date: 05/23/2022   PT End of Session - 05/23/22 1028     Visit Number 8    Number of Visits 22    Date for PT Re-Evaluation 06/20/22    Authorization Type UHC MCR    PT Start Time 1021    PT Stop Time 1102    PT Time Calculation (min) 41 min    Activity Tolerance Patient tolerated treatment well    Behavior During Therapy WFL for tasks assessed/performed                  Past Medical History:  Diagnosis Date   Anemia    Anginal pain ([place])    last cp was last year   Anxiety    Arthritis    Blood transfusion    Chest pain 02/04/2016   Coronary artery disease    Coronary artery spasm, wth continued episodes of chest pain.  09/26/2013   Coronary vasospasm (H[facility])    Diabetes mellitus without complication (HCC)    type II   GERD (gastroesophageal reflux disease)    Headache    Hiatal hernia    Hypercholesteremia    Hypertension    NSTEMI (non-ST elevated myocardial infarction) ([place]) not sure   NSVT (nonsustained ventricular tachycardia) ([place]) 09/26/2013   Panic attack    Past Surgical History:  Procedure Laterality Date   ABDOMINAL HYSTERECTOMY     PARTIAL HYSTERECTOMY   ACDF N/A    C5-6 ACDF Dr. Sherwood Gambler   breast     right, tumor benign   BREAST EXCISIONAL BIOPSY Right    1961 (age 89) benign   CARDIAC CATHETERIZATION  2014   CARDIAC CATHETERIZATION  2012   CARDIAC CATHETERIZATION N/A 07/22/2016   Procedure: Left Heart Cath and Coronary Angiography;  Surgeon: Belva Crome, MD;  Location: [facility];  Service: Cardiovascular;  Laterality: N/A;   cardiac stent  2012   to RCA   CHOLECYSTECTOMY N/A 10/02/2017   Procedure: LAPAROSCOPIC CHOLECYSTECTOMY;  Surgeon: Ralene Ok, MD;  Location: [facility];  Service: General;  Laterality: N/A;   CORONARY ANGIOPLASTY     ESOPHAGEAL MANOMETRY N/A 10/29/2016   Procedure:  ESOPHAGEAL MANOMETRY (EM);  Surgeon: Garlan Fair, MD;  [facility];  Service: Endoscopy;  Laterality: N/A;   ESOPHAGOGASTRODUODENOSCOPY (EGD) WITH PROPOFOL N/A 10/28/2016   Procedure: ESOPHAGOGASTRODUODENOSCOPY (EGD) WITH PROPOFOL;  Surgeon: Garlan Fair, MD;  [facility];  Service: Endoscopy;  Laterality: N/A;   EXCISION OF SKIN TAG N/A 07/29/2017   Procedure: EXCISION OF PERIANAL  SKIN TAG;  Surgeon: Ralene Ok, MD;  Location: [facility];  Service: General;  Laterality: N/A;   KNEE SURGERY  left   left ear surgery     for bad cut   LEFT HEART CATHETERIZATION WITH CORONARY ANGIOGRAM N/A 02/22/2013   Procedure: LEFT HEART CATHETERIZATION WITH CORONARY ANGIOGRAM;  Surgeon: [name], MD;  Location: [facility];  Service: Cardiovascular;  Laterality: N/A;   REVERSE SHOULDER ARTHROPLASTY Right 04/07/2022   Procedure: RIGHT SHOULDER REVERSE SHOULDER ARTHROPLASTY;  Surgeon: Vanetta Mulders, MD;  Location: [facility];  Service: Orthopedics;  Laterality: Right;   SHOULDER SURGERY Bilateral    rotator cuff   TOE SURGERY Left    toe surgery   Patient Active Problem List   Diagnosis Date Noted   Rotator cuff arthropathy of right shoulder  Carpal tunnel syndrome, right upper limb 01/03/2022   Chest pain 06/21/2021   Variant angina ([place]) 01/24/2017   Precordial chest pain 01/23/2017   Headache 01/23/2017   S/P angioplasty with stent 01/23/2017   Coronary artery disease 11/24/2014   Syncope 10/22/2014   Hyperglycemia 04/20/2014   H/O hiatal hernia 04/19/2014   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    Coronary artery vasospasm ([place]) 09/26/2013   HTN (hypertension) 12/08/2011   GERD (gastroesophageal reflux disease) 12/08/2011   Depression 12/08/2011   Dyslipidemia 12/08/2011     REFERRING PROVIDER: Sammuel Hines, MD  REFERRING DIAG: s/p Rt reverse TSA + biceps tenodesis, without subscap repair    THERAPY DIAG:  Acute pain of right shoulder  Stiffness of right shoulder, not  elsewhere classified  Muscle weakness (generalized)  Rationale for Evaluation and Treatment Rehabilitation  ONSET DATE: DOS 04/07/22  SUBJECTIVE:                                                                                                                                                                                      SUBJECTIVE STATEMENT: Pt is 6 weeks and 4 days s/p R reverse TSA and biceps tenodesis.  Pt reports her pain starts in her R shoulder and goes all the way down to her wrist.  Pt states her wrist is still bothering her.  Pt states she felt fine after prior Rx that day though had difficulty moving R UE the following day.  "I think I am improving".  Pt is limited with elevation though states she is improving.  She has difficulty with lowering R UE.  Pt reports she is able to brush her teeth with her R UE.  Pt able to pick up a 1/2 glass of juice.  Pt unable to turn faucets on well.  She has difficulty turning on light switch.  Pt has difficulty with writing.    Pt reports she has stiffness in AM.  Pt denies any adverse effects after prior Rx.  Pt c/o's of tightness in anterior shoulder.  Pt denies pain currently, just has stiffness.  Pt reports her wrist and hand hurt.  Pt had carpal tunnel release in July.    PERTINENT HISTORY: H/o bil RCR R carpal tunnel release in July 2023  PAIN:  Are you having pain? Yes: NPRS scale: 5/10 Pain location: Rt anterior shoulder down R UE to wrist Pain description: stinging Aggravating factors:   Relieving factors: pain meds  PRECAUTIONS: Shoulder, reverse TSA without subscap repair  WEIGHT BEARING RESTRICTIONS Yes NWB UE  FALLS:  Has patient fallen in last 6 months? No  LIVING ENVIRONMENT: Lives with: lives with their spouse No stairs  OCCUPATION:  Not working  PLOF: Independent  PATIENT GOALS the use of my arm and hand back, gardening  OBJECTIVE:    TODAY'S TREATMENT:  Therapeutic Exercise: -Reviewed pain level,  response to prior Rx, and HEP compliance. -Pt received R shoulder PROM in flexion, scaption, and ER per protocol ranges w/n pt and tissue tolerance. -Pt performed:  Pulleys in flexion 2x10  Supine wand flexion x10 reps w/n protocol range with R LE elevated on towels  Supine wand ER 2x10 w/n protocol range  Supine serratus punch 2x10 PT assist at elbow for positioning 2x10  Supine wrist clasped flexion AAROM x10 reps  Supine shoulder flexion AAROM with PT assist x 10 reps  Prone extension to neutral x 10 reps  Submaximal isometrics in flex with 5 sec hold  PT updated HEP and gave pt a HEP handout.  Educated pt in correct form and appropriate frequency.     PATIENT EDUCATION: Education details: Anatomy of condition, POC, HEP, exercise form/rationale, sling wear Person educated: Patient and Spouse Education method: Explanation, Demonstration, Tactile cues, Verbal cues, and Handouts Education comprehension: verbalized understanding, returned demonstration, verbal cues required, tactile cues required, and needs further education   HOME EXERCISE PROGRAM: [email or web address]  Updated HEP: - Isometric Shoulder Flexion at [place]  - 2 x daily - 7 x weekly - 1 sets - 5-10 reps - 5 seconds hold   ASSESSMENT:  CLINICAL IMPRESSION: Pt had a carpal tunnel release in July and continues to c/o of pain and stiffness in wrist.  Pt continues to have pain and difficulty with lowering R UE when performing supine flexion.  Pt has pain with the lowering portion of supine wand flexion though tolerates it better with wrist clasped.  Pt also performed supine shoulder flexion AAROM with PT providing support.  She performed the elevation portion well without much assistance though required more assistance for the lowering portion.  Pt demonstrates improved form requiring less assistance with supine serratus punch.  She primarily required assistance to keep elbow and hand in correct position.  Pt had no pain with PROM and  tolerates PROM well.  Pt does have tightness in flexion PROM which improves with increased reps.  Pt responded well to Rx having no c/o's after Rx.  Pt should benefit from cont skilled PT services per protocol to address ongoing goals and to assist in restoring desired level of function.    OBJECTIVE IMPAIRMENTS decreased activity tolerance, decreased knowledge of condition, decreased ROM, decreased strength, increased muscle spasms, impaired flexibility, impaired UE functional use, improper body mechanics, postural dysfunction, and pain.   ACTIVITY LIMITATIONS carrying, lifting, sleeping, bed mobility, bathing, dressing, reach over head, and hygiene/grooming  PARTICIPATION LIMITATIONS: meal prep, cleaning, laundry, driving, shopping, and yard work  PERSONAL FACTORS Past/current experiences and see PMH  are also affecting patient's functional outcome.   REHAB POTENTIAL: Good  CLINICAL DECISION MAKING: Stable/uncomplicated  EVALUATION COMPLEXITY: Low   GOALS: Goals reviewed with patient? Yes  SHORT TERM GOALS: Target date: 05/09/2022   AROM flexion to 90 without incr pain and good scap control Baseline:see chart Goal status: INITIAL  2.  ER in scapular plane to 30 deg Baseline: see chart Goal status: INITIAL  3.  Pain levels <=3/10 Baseline: see subj reports Goal status: INITIAL  4.  D/c sling Baseline: as tolerated at 3 wk Goal status: INITIAL   LONG TERM GOALS: Target date: 05/23/2022    1.  Pt will meet FOTO goal Baseline: see above Goal status: INITIAL Target date:  06/20/22  2.  AROM flexion to 130 without incr pain and good scap control Baseline:  Goal status: INITIAL Target date: 05/23/2022   3.  Use of arm for ADLs pain <=3/10 Baseline:  Goal status: INITIAL Target date: 05/23/2022   4.  Initiate functional IR behind back without incr pain Baseline: can begin at 6 weeks per post op protocol Goal status: INITIAL Target date: 05/23/2022     PLAN: PT  FREQUENCY: 1-2x/week  PT DURATION: 10 weeks  PLANNED INTERVENTIONS: Therapeutic exercises, Therapeutic activity, Neuromuscular re-education, Balance training, Gait training, Patient/Family education, Self Care, Joint mobilization, Aquatic Therapy, Dry Needling, Electrical stimulation, Spinal mobilization, Cryotherapy, Moist heat, scar mobilization, Taping, Vasopneumatic device, Manual therapy, and Re-evaluation  PLAN FOR NEXT SESSION: continue per Dr. Eddie Dibbles reverse TSA protocol   Selinda Michaels III PT, DPT 05/23/22 11:28 AM

## 2022-05-27 NOTE — Therapy (Signed)
OUTPATIENT PHYSICAL THERAPY SHOULDER TREATMENT   Patient Name: [name] MRN: [id number] DOB:1946/01/09, 76 y.o., female Today's Date: 05/28/2022   PT End of Session - 05/28/22 1021     Visit Number 9    Number of Visits 22    Date for PT Re-Evaluation 06/20/22    Authorization Type UHC MCR    PT Start Time 1017    PT Stop Time 1100    PT Time Calculation (min) 43 min    Activity Tolerance Patient tolerated treatment well    Behavior During Therapy WFL for tasks assessed/performed                   Past Medical History:  Diagnosis Date   Anemia    Anginal pain ([place])    last cp was last year   Anxiety    Arthritis    Blood transfusion    Chest pain 02/04/2016   Coronary artery disease    Coronary artery spasm, wth continued episodes of chest pain.  09/26/2013   Coronary vasospasm ([facility])    Diabetes mellitus without complication (HCC)    type II   GERD (gastroesophageal reflux disease)    Headache    Hiatal hernia    Hypercholesteremia    Hypertension    NSTEMI (non-ST elevated myocardial infarction) ([place]) not sure   NSVT (nonsustained ventricular tachycardia) ([place]) 09/26/2013   Panic attack    Past Surgical History:  Procedure Laterality Date   ABDOMINAL HYSTERECTOMY     PARTIAL HYSTERECTOMY   ACDF N/A    C5-6 ACDF Dr. Sherwood Gambler   breast     right, tumor benign   BREAST EXCISIONAL BIOPSY Right    1961 (age 50) benign   CARDIAC CATHETERIZATION  2014   CARDIAC CATHETERIZATION  2012   CARDIAC CATHETERIZATION N/A 07/22/2016   Procedure: Left Heart Cath and Coronary Angiography;  Surgeon: Belva Crome, MD;  Location: [facility];  Service: Cardiovascular;  Laterality: N/A;   cardiac stent  2012   to RCA   CHOLECYSTECTOMY N/A 10/02/2017   Procedure: LAPAROSCOPIC CHOLECYSTECTOMY;  Surgeon: Ralene Ok, MD;  Location: [facility];  Service: General;  Laterality: N/A;   CORONARY ANGIOPLASTY     ESOPHAGEAL MANOMETRY N/A 10/29/2016   Procedure:  ESOPHAGEAL MANOMETRY (EM);  Surgeon: Garlan Fair, MD;  Location: [facility];  Service: Endoscopy;  Laterality: N/A;   ESOPHAGOGASTRODUODENOSCOPY (EGD) WITH PROPOFOL N/A 10/28/2016   Procedure: ESOPHAGOGASTRODUODENOSCOPY (EGD) WITH PROPOFOL;  Surgeon: Garlan Fair, MD;  Location: [facility];  Service: Endoscopy;  Laterality: N/A;   EXCISION OF SKIN TAG N/A 07/29/2017   Procedure: EXCISION OF PERIANAL  SKIN TAG;  Surgeon: Ralene Ok, MD;  Location: [facility];  Service: General;  Laterality: N/A;   KNEE SURGERY  left   left ear surgery     for bad cut   LEFT HEART CATHETERIZATION WITH CORONARY ANGIOGRAM N/A 02/22/2013   Procedure: LEFT HEART CATHETERIZATION WITH CORONARY ANGIOGRAM;  Surgeon: [name], MD;  Location: [facility];  Service: Cardiovascular;  Laterality: N/A;   REVERSE SHOULDER ARTHROPLASTY Right 04/07/2022   Procedure: RIGHT SHOULDER REVERSE SHOULDER ARTHROPLASTY;  Surgeon: Vanetta Mulders, MD;  Location: [place];  Service: Orthopedics;  Laterality: Right;   SHOULDER SURGERY Bilateral    rotator cuff   TOE SURGERY Left    toe surgery   Patient Active Problem List   Diagnosis Date Noted   Rotator cuff arthropathy of right shoulder  Carpal tunnel syndrome, right upper limb 01/03/2022   Chest pain 06/21/2021   Variant angina ([place]) 01/24/2017   Precordial chest pain 01/23/2017   Headache 01/23/2017   S/P angioplasty with stent 01/23/2017   Coronary artery disease 11/24/2014   Syncope 10/22/2014   Hyperglycemia 04/20/2014   H/O hiatal hernia 04/19/2014   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    Coronary artery vasospasm ([place]) 09/26/2013   HTN (hypertension) 12/08/2011   GERD (gastroesophageal reflux disease) 12/08/2011   Depression 12/08/2011   Dyslipidemia 12/08/2011     REFERRING PROVIDER: Vanetta Mulders MD  REFERRING DIAG: s/p Rt reverse TSA + biceps tenodesis, without subscap repair    THERAPY DIAG:  Acute pain of right shoulder  Stiffness of right shoulder, not  elsewhere classified  Muscle weakness (generalized)  Rationale for Evaluation and Treatment Rehabilitation  ONSET DATE: DOS 04/07/22  SUBJECTIVE:                                                                                                                                                                                      SUBJECTIVE STATEMENT: Pt is 7 weeks and 2 days s/p R reverse TSA and biceps tenodesis.  Pt states her shoulder is feeling better and her pain is less.  Her wrist/hand feels the same.  She continues to have stiffness in shoulder and wrist.  Pt able to hold her coffee and has improved with eating.  Pt denies any adverse effects after prior Rx.  Pt is limited with elevation though states she is improving.  She has difficulty with lowering R UE.  Pt reports she is able to brush her teeth with her R UE.  She has difficulty turning on light switch.  Pt has difficulty with writing.    PERTINENT HISTORY: H/o bil RCR R carpal tunnel release in July 2023  PAIN:  Are you having pain? Yes: NPRS scale: 5/10 Pain location: Rt anterior shoulder  Pain description: stinging Aggravating factors:   Relieving factors: pain meds  PRECAUTIONS: Shoulder, reverse TSA without subscap repair  WEIGHT BEARING RESTRICTIONS Yes NWB UE  FALLS:  Has patient fallen in last 6 months? No  LIVING ENVIRONMENT: Lives with: lives with their spouse No stairs  OCCUPATION: Not working  PLOF: Independent  PATIENT GOALS the use of my arm and hand back, gardening  OBJECTIVE:    TODAY'S TREATMENT:  Therapeutic Exercise: -Reviewed pain level, response to prior Rx, and HEP compliance. -Pt received R shoulder PROM in flexion, scaption, and ER per protocol ranges w/n pt and tissue tolerance. -Pt performed:  Pulleys in flexion and scaption approx 20 each  Seated table slides in flexion x 10  Supine wrist  clasped flexion AAROM x10 reps   Supine wand flexion 2x10 reps w/n protocol range with R UE  elevated on towels (limited lowering range)  Supine wand ER 2x10 w/n protocol range  Supine shoulder flexion AAROM with PT assist 2 x 10 reps  Supine serratus punch 2x10 PT assist   S/L shoulder abduction < 90 deg with PT assist x 10 reps   Prone extension to neutral 2 x 10 reps    ER AAROM:  32 deg  PT updated HEP and gave pt a HEP handout.  Educated pt in correct form and appropriate frequency.     PATIENT EDUCATION: Education details: relevant anatomy, protocol expectations and ROM limitations, POC, HEP, and exercise form/rationale. Person educated: Patient and Spouse Education method: Explanation, Demonstration, Tactile cues, Verbal cues, and Handouts Education comprehension: verbalized understanding, returned demonstration, verbal cues required, tactile cues required, and needs further education   HOME EXERCISE PROGRAM: [id number]  Updated HEP: - Prone Shoulder Extension - Single Arm  - 1 x daily - 6-7 x weekly - 2 sets - 10 reps   ASSESSMENT:  CLINICAL IMPRESSION: Pt reports she is improving with reaching and pain.  Pt is improving with supine wand flexion and had less pain though continues to have pain with lowering UE in certain ranges.  PT limited lowering range and also used verbal and tactile cues with supine wand flexion.  She demonstrates improved ease and less pain with supine wand flexion when limited lowering range.  Pt demonstrates much improved ease and performance of supine flexion AAROM with PT assistance.  Pt required PT assistance with supine serratus punches.  Pt had no pain with PROM and tolerates PROM well.  Pt responded well to Rx having no increased pain after Rx.  Pt should benefit from cont skilled PT services per protocol to address ongoing goals and to assist in restoring desired level of function.      OBJECTIVE IMPAIRMENTS decreased activity tolerance, decreased knowledge of condition, decreased ROM, decreased strength, increased muscle spasms, impaired  flexibility, impaired UE functional use, improper body mechanics, postural dysfunction, and pain.   ACTIVITY LIMITATIONS carrying, lifting, sleeping, bed mobility, bathing, dressing, reach over head, and hygiene/grooming  PARTICIPATION LIMITATIONS: meal prep, cleaning, laundry, driving, shopping, and yard work  PERSONAL FACTORS Past/current experiences and see PMH  are also affecting patient's functional outcome.   REHAB POTENTIAL: Good  CLINICAL DECISION MAKING: Stable/uncomplicated  EVALUATION COMPLEXITY: Low   GOALS: Goals reviewed with patient? Yes  SHORT TERM GOALS: Target date: 05/09/2022   AROM flexion to 90 without incr pain and good scap control Baseline:see chart Goal status: INITIAL  2.  ER in scapular plane to 30 deg Baseline: see chart Goal status: INITIAL  3.  Pain levels <=3/10 Baseline: see subj reports Goal status: INITIAL  4.  D/c sling Baseline: as tolerated at 3 wk Goal status: INITIAL   LONG TERM GOALS: Target date: 05/23/2022    1.  Pt will meet FOTO goal Baseline: see above Goal status: INITIAL Target date: 06/20/22  2.  AROM flexion to 130 without incr pain and good scap control Baseline:  Goal status: INITIAL Target date: 05/23/2022   3.  Use of arm for ADLs pain <=3/10 Baseline:  Goal status: INITIAL Target date: 05/23/2022   4.  Initiate functional IR behind back without incr pain Baseline: can begin at 6 weeks per post op protocol Goal status: INITIAL Target date: 05/23/2022     PLAN: PT FREQUENCY: 1-2x/week  PT  DURATION: 10 weeks  PLANNED INTERVENTIONS: Therapeutic exercises, Therapeutic activity, Neuromuscular re-education, Balance training, Gait training, Patient/Family education, Self Care, Joint mobilization, Aquatic Therapy, Dry Needling, Electrical stimulation, Spinal mobilization, Cryotherapy, Moist heat, scar mobilization, Taping, Vasopneumatic device, Manual therapy, and Re-evaluation  PLAN FOR NEXT SESSION:  continue per Dr. Eddie Dibbles reverse TSA protocol   Selinda Michaels III PT, DPT 05/28/22 9:24 PM

## 2022-05-28 ENCOUNTER — Encounter (HOSPITAL_BASED_OUTPATIENT_CLINIC_OR_DEPARTMENT_OTHER): Payer: Self-pay | Admitting: Physical Therapy

## 2022-05-28 ENCOUNTER — Ambulatory Visit (HOSPITAL_BASED_OUTPATIENT_CLINIC_OR_DEPARTMENT_OTHER): Payer: Medicare Other | Admitting: Physical Therapy

## 2022-05-28 DIAGNOSIS — M6281 Muscle weakness (generalized): Secondary | ICD-10-CM

## 2022-05-28 DIAGNOSIS — M25511 Pain in right shoulder: Secondary | ICD-10-CM

## 2022-05-28 DIAGNOSIS — M25611 Stiffness of right shoulder, not elsewhere classified: Secondary | ICD-10-CM

## 2022-05-30 ENCOUNTER — Ambulatory Visit (HOSPITAL_BASED_OUTPATIENT_CLINIC_OR_DEPARTMENT_OTHER): Payer: Medicare Other | Admitting: Physical Therapy

## 2022-05-30 ENCOUNTER — Encounter (HOSPITAL_BASED_OUTPATIENT_CLINIC_OR_DEPARTMENT_OTHER): Payer: Self-pay | Admitting: Physical Therapy

## 2022-05-30 DIAGNOSIS — M25611 Stiffness of right shoulder, not elsewhere classified: Secondary | ICD-10-CM | POA: Diagnosis not present

## 2022-05-30 DIAGNOSIS — M25511 Pain in right shoulder: Secondary | ICD-10-CM | POA: Diagnosis not present

## 2022-05-30 DIAGNOSIS — M6281 Muscle weakness (generalized): Secondary | ICD-10-CM | POA: Diagnosis not present

## 2022-05-30 NOTE — Therapy (Signed)
OUTPATIENT PHYSICAL THERAPY SHOULDER TREATMENT   Patient Name: [name] MRN: [id number] DOB:[birth year], 76 y.o., female Today's Date: 05/30/2022   PT End of Session - 05/30/22 1111     Visit Number 10    Number of Visits 28    Date for PT Re-Evaluation 08/01/22    Authorization Type UHC MCR    Progress Note Due on Visit 20    PT Start Time 1022    PT Stop Time 1104    PT Time Calculation (min) 42 min    Activity Tolerance Patient tolerated treatment well    Behavior During Therapy WFL for tasks assessed/performed                    Past Medical History:  Diagnosis Date   Anemia    Anginal pain ([place])    last cp was last year   Anxiety    Arthritis    Blood transfusion    Chest pain 02/04/2016   Coronary artery disease    Coronary artery spasm, wth continued episodes of chest pain.  09/26/2013   Coronary vasospasm (HCC)    Diabetes mellitus without complication (HCC)    type II   GERD (gastroesophageal reflux disease)    Headache    Hiatal hernia    Hypercholesteremia    Hypertension    NSTEMI (non-ST elevated myocardial infarction) ([place]) not sure   NSVT (nonsustained ventricular tachycardia) ([place]) 09/26/2013   Panic attack    Past Surgical History:  Procedure Laterality Date   ABDOMINAL HYSTERECTOMY     PARTIAL HYSTERECTOMY   ACDF N/A    C5-6 ACDF Dr. Sherwood Gambler   breast     right, tumor benign   BREAST EXCISIONAL BIOPSY Right    1961 (age 26) benign   CARDIAC CATHETERIZATION  2014   CARDIAC CATHETERIZATION  2012   CARDIAC CATHETERIZATION N/A 07/22/2016   Procedure: Left Heart Cath and Coronary Angiography;  Surgeon: Belva Crome, MD;  Location: [facility];  Service: Cardiovascular;  Laterality: N/A;   cardiac stent  2012   to RCA   CHOLECYSTECTOMY N/A 10/02/2017   Procedure: LAPAROSCOPIC CHOLECYSTECTOMY;  Surgeon: Ralene Ok, MD;  Location: [facility];  Service: General;  Laterality: N/A;   CORONARY ANGIOPLASTY     ESOPHAGEAL  MANOMETRY N/A 10/29/2016   Procedure: ESOPHAGEAL MANOMETRY (EM);  Surgeon: Garlan Fair, MD;  [facility];  Service: Endoscopy;  Laterality: N/A;   ESOPHAGOGASTRODUODENOSCOPY (EGD) WITH PROPOFOL N/A 10/28/2016   Procedure: ESOPHAGOGASTRODUODENOSCOPY (EGD) WITH PROPOFOL;  Surgeon: Garlan Fair, MD;  [facility];  Service: Endoscopy;  Laterality: N/A;   EXCISION OF SKIN TAG N/A 07/29/2017   Procedure: EXCISION OF PERIANAL  SKIN TAG;  Surgeon: Ralene Ok, MD;  Location: [facility];  Service: General;  Laterality: N/A;   KNEE SURGERY  left   left ear surgery     for bad cut   LEFT HEART CATHETERIZATION WITH CORONARY ANGIOGRAM N/A 02/22/2013   Procedure: LEFT HEART CATHETERIZATION WITH CORONARY ANGIOGRAM;  Surgeon: [name], MD;  Location: [facility] CATH [facility];  Service: Cardiovascular;  Laterality: N/A;   REVERSE SHOULDER ARTHROPLASTY Right 04/07/2022   Procedure: RIGHT SHOULDER REVERSE SHOULDER ARTHROPLASTY;  Surgeon: Vanetta Mulders, MD;  Location: [facility];  Service: Orthopedics;  Laterality: Right;   SHOULDER SURGERY Bilateral    rotator cuff   TOE SURGERY Left    toe surgery   Patient Active Problem List   Diagnosis Date Noted  Rotator cuff arthropathy of right shoulder    Carpal tunnel syndrome, right upper limb 01/03/2022   Chest pain 06/21/2021   Variant angina ([place]) 01/24/2017   Precordial chest pain 01/23/2017   Headache 01/23/2017   S/P angioplasty with stent 01/23/2017   Coronary artery disease 11/24/2014   Syncope 10/22/2014   Hyperglycemia 04/20/2014   H/O hiatal hernia 04/19/2014   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    Coronary artery vasospasm ([place]) 09/26/2013   HTN (hypertension) 12/08/2011   GERD (gastroesophageal reflux disease) 12/08/2011   Depression 12/08/2011   Dyslipidemia 12/08/2011     REFERRING PROVIDER: Vanetta Mulders MD  REFERRING DIAG: s/p Rt reverse TSA + biceps tenodesis, without subscap repair    THERAPY DIAG:  Acute pain of right  shoulder  Stiffness of right shoulder, not elsewhere classified  Muscle weakness (generalized)  Rationale for Evaluation and Treatment Rehabilitation  ONSET DATE: DOS 04/07/22  SUBJECTIVE:                                                                                                                                                                                      SUBJECTIVE STATEMENT: Pt is 7 weeks and 4 days s/p R reverse TSA and biceps tenodesis.  Pt states her shoulder is feeling better and her pain is less.  She continues to have stiffness in shoulder and wrist.  She has difficulty with lowering R UE.  HEP COMPLIANCE:  Pt reports compliance with HEP. RESPONSE TO PRIOR RX:  Pt reports stiffness after prior Rx and denies adverse effects. FUNCTIONAL IMPROVEMENTS:  Pt reports she is able to lift R UE higher and able to hold it up longer.  Donning shirt, buttoning shirts, lift 1/2 full cup of coffee, washing dishes, eating, brushing teeth. FUNCTIONAL LIMITATIONS:  doffing shirt, cooking, reaching activities.  Turning on light switch. Unable to perform overhead activities.  Limited per protocol.   PERTINENT HISTORY: H/o bil RCR R carpal tunnel release in July 2023  PAIN:  Are you having pain? Yes: NPRS scale: Current:  4/10, Worst:  6/10, Best:  2/10 Pain location: Rt anterior shoulder  Pain description: stinging Aggravating factors:   Relieving factors: pain meds  PRECAUTIONS: Shoulder, reverse TSA without subscap repair  WEIGHT BEARING RESTRICTIONS Yes NWB UE  FALLS:  Has patient fallen in last 6 months? No  LIVING ENVIRONMENT: Lives with: lives with their spouse No stairs  OCCUPATION: Not working  PLOF: Independent  PATIENT GOALS the use of my arm and hand back, gardening  OBJECTIVE:    TODAY'S TREATMENT:  Therapeutic Exercise: -Reviewed pain level, response to prior Rx, and HEP compliance. -Pt received R shoulder PROM in flexion, scaption,  abduction and ER  per protocol ranges w/n pt and tissue tolerance. -Pt performed:  Pulleys in flexion and scaption approx 20 each  Seated table slides in flexion 2 x 10-15  Supine shoulder flexion AAROM with PT assist approx 4-5 reps  Supine flexion AROM x 5-6 reps and x 10 reps  S/L shoulder abduction to 90 deg with minimal PT assist 2 x 10 reps   Prone extension to neutral 2 x 10 reps    R shoulder ROM: PROM:  flex:  140 deg, abd:  108 deg, ER:  41 deg AROM:  Supine: flexion:  126 deg, ER:  34 deg   PT updated HEP and gave pt a HEP handout.  Educated pt in correct form and appropriate frequency.  PATIENT SURVEYS:  FOTO:  Initia/Current:  4 / 38. Goal of 47 at visit #20     PATIENT EDUCATION: Education details: relevant anatomy, protocol expectations and ROM limitations, POC, HEP, and exercise form/rationale. Person educated: Patient and Spouse Education method: Explanation, Demonstration, Tactile cues, Verbal cues, and Handouts Education comprehension: verbalized understanding, returned demonstration, verbal cues required, tactile cues required, and needs further education   HOME EXERCISE PROGRAM: [id number]  Updated HEP:  - Supine Shoulder Flexion AROM  - 1-2 x daily - 7 x weekly - 1-2 sets - 5-10 reps   ASSESSMENT:  CLINICAL IMPRESSION:     Pt is improving with functional usage of R UE as evidenced by subjective reports.  She is able to don shirt, eat, brush teeth, and wash dishes.  She is improving with reaching with R UE though has difficulty with lowering R UE.  Pt has expected limitations with reaching activities and reports difficulty with lowering R UE after she lifts her arm.  Pt is improving with ROM t/o R shoulder and demonstrates appropriate PROM at this time in protocol.  Pt demonstrates much improved performance and ease with supine flexion AROM and lowering arm.  She required decreased assistance with flexion AROM concentrically and eccentrically and was able to perform  independently.  Pt is compliant with HEP.  Pt met STG #2,4 and is progressing toward other goals.  PT updated LTG's--see below.  Pt demonstrated improved self perceived disability with FOTO improving from initially 4 to currently 38.  Pt should benefit from cont skilled PT services per protocol to address ongoing goals and to assist in restoring desired level of function.   OBJECTIVE IMPAIRMENTS decreased activity tolerance, decreased knowledge of condition, decreased ROM, decreased strength, increased muscle spasms, impaired flexibility, impaired UE functional use, improper body mechanics, postural dysfunction, and pain.   ACTIVITY LIMITATIONS carrying, lifting, sleeping, bed mobility, bathing, dressing, reach over head, and hygiene/grooming  PARTICIPATION LIMITATIONS: meal prep, cleaning, laundry, driving, shopping, and yard work  PERSONAL FACTORS Past/current experiences and see PMH  are also affecting patient's functional outcome.   REHAB POTENTIAL: Good  CLINICAL DECISION MAKING: Stable/uncomplicated  EVALUATION COMPLEXITY: Low   GOALS:  SHORT TERM GOALS: Target date: 05/09/2022   AROM flexion to 90 without incr pain and good scap control Baseline:see chart Goal status: met in supine, not assessed in standing  2.  ER in scapular plane to 30 deg Baseline: see chart Goal status: GOAL MET  3.  Pain levels <=3/10 Baseline: see subj reports Goal status:  ONGOING  4.  D/c sling Baseline: as tolerated at 3 wk Goal status: GOAL MET   LONG TERM GOALS: Target date: 05/23/2022    1.  Pt will meet FOTO goal  Baseline: see above Goal status: ONGOING Target date: 06/20/22  2.  AROM flexion to 130 without incr pain and good scap control Baseline:  Goal status: ONGOING Target date: 05/23/2022   3.  Use of arm for ADLs pain <=3/10 Baseline:  Goal status: ONGOING Target date: 05/23/2022   4.  Initiate functional IR behind back without incr pain Baseline: can begin at 6 weeks  per post op protocol Goal status: NOT MET Target date: 05/23/2022   5.  Pt will demo R shoulder AROM to be [facility] t/o for performance of ADLs and IADLs.  Goal status: INITIAL Target date: 08/01/22  6.  Pt will be able to perform her ADLs and IADLs including reaching activities without significant pain and difficulty.  Goal status: INITIAL Target date: 08/01/22  7.  Pt will be able to reach into an overhead cabinet without significant difficulty.  Goal status: INITIAL Target date: 08/01/22    PLAN: PT FREQUENCY: 2x/wk   PT DURATION: 9 weeks  PLANNED INTERVENTIONS: Therapeutic exercises, Therapeutic activity, Neuromuscular re-education, Balance training, Gait training, Patient/Family education, Self Care, Joint mobilization, Aquatic Therapy, Dry Needling, Electrical stimulation, Spinal mobilization, Cryotherapy, Moist heat, scar mobilization, Taping, Vasopneumatic device, Manual therapy, and Re-evaluation  PLAN FOR NEXT SESSION: continue per Dr. Eddie Dibbles reverse TSA protocol.   Selinda Michaels III PT, DPT 05/30/22 3:43 PM

## 2022-06-03 NOTE — Therapy (Signed)
OUTPATIENT PHYSICAL THERAPY SHOULDER TREATMENT   Patient Name: [name] MRN: [id number] DOB:[birth year], 76 y.o., female Today's Date: 06/05/2022   PT End of Session - 06/04/22 1019     Visit Number 11    Number of Visits 28    Date for PT Re-Evaluation 08/01/22    Authorization Type [employer] MCR    PT Start Time 1016    PT Stop Time 1100    PT Time Calculation (min) 44 min    Activity Tolerance Patient tolerated treatment well    Behavior During Therapy WFL for tasks assessed/performed                     Past Medical History:  Diagnosis Date   Anemia    Anginal pain ([place])    last cp was last year   Anxiety    Arthritis    Blood transfusion    Chest pain 02/04/2016   Coronary artery disease    Coronary artery spasm, wth continued episodes of chest pain.  09/26/2013   Coronary vasospasm (H[facility])    Diabetes mellitus without complication (H[facility])    type II   GERD (gastroesophageal reflux disease)    Headache    Hiatal hernia    Hypercholesteremia    Hypertension    NSTEMI (non-ST elevated myocardial infarction) ([place]) not sure   NSVT (nonsustained ventricular tachycardia) ([place]) 09/26/2013   Panic attack    Past Surgical History:  Procedure Laterality Date   ABDOMINAL HYSTERECTOMY     PARTIAL HYSTERECTOMY   ACDF N/A    C5-6 ACDF Dr. Sherwood Gambler   breast     right, tumor benign   BREAST EXCISIONAL BIOPSY Right    1961 (age 74) benign   CARDIAC CATHETERIZATION  2014   CARDIAC CATHETERIZATION  2012   CARDIAC CATHETERIZATION N/A 07/22/2016   Procedure: Left Heart Cath and Coronary Angiography;  Surgeon: Belva Crome, MD;  Location: [facility];  Service: Cardiovascular;  Laterality: N/A;   cardiac stent  2012   to RCA   CHOLECYSTECTOMY N/A 10/02/2017   Procedure: LAPAROSCOPIC CHOLECYSTECTOMY;  Surgeon: Ralene Ok, MD;  Location: [facility];  Service: General;  Laterality: N/A;   CORONARY ANGIOPLASTY     ESOPHAGEAL MANOMETRY N/A 10/29/2016    Procedure: ESOPHAGEAL MANOMETRY (EM);  Surgeon: Garlan Fair, MD;  Location: [facility];  Service: Endoscopy;  Laterality: N/A;   ESOPHAGOGASTRODUODENOSCOPY (EGD) WITH PROPOFOL N/A 10/28/2016   Procedure: ESOPHAGOGASTRODUODENOSCOPY (EGD) WITH PROPOFOL;  Surgeon: Garlan Fair, MD;  [facility]: [facility];  Service: Endoscopy;  Laterality: N/A;   EXCISION OF SKIN TAG N/A 07/29/2017   Procedure: EXCISION OF PERIANAL  SKIN TAG;  Surgeon: Ralene Ok, MD;  Location: [place];  Service: General;  Laterality: N/A;   KNEE SURGERY  left   left ear surgery     for bad cut   LEFT HEART CATHETERIZATION WITH CORONARY ANGIOGRAM N/A 02/22/2013   Procedure: LEFT HEART CATHETERIZATION WITH CORONARY ANGIOGRAM;  Surgeon: [name], MD;  Location: [facility];  Service: Cardiovascular;  Laterality: N/A;   REVERSE SHOULDER ARTHROPLASTY Right 04/07/2022   Procedure: RIGHT SHOULDER REVERSE SHOULDER ARTHROPLASTY;  Surgeon: Vanetta Mulders, MD;  Location: [place];  Service: Orthopedics;  Laterality: Right;   SHOULDER SURGERY Bilateral    rotator cuff   TOE SURGERY Left    toe surgery   Patient Active Problem List   Diagnosis Date Noted   Rotator cuff arthropathy of right shoulder  Carpal tunnel syndrome, right upper limb 01/03/2022   Chest pain 06/21/2021   Variant angina ([place]) 01/24/2017   Precordial chest pain 01/23/2017   Headache 01/23/2017   S/P angioplasty with stent 01/23/2017   Coronary artery disease 11/24/2014   Syncope 10/22/2014   Hyperglycemia 04/20/2014   H/O hiatal hernia 04/19/2014   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    Coronary artery vasospasm ([place]) 09/26/2013   HTN (hypertension) 12/08/2011   GERD (gastroesophageal reflux disease) 12/08/2011   Depression 12/08/2011   Dyslipidemia 12/08/2011     REFERRING PROVIDER: Vanetta Mulders MD  REFERRING DIAG: s/p Rt reverse TSA + biceps tenodesis, without subscap repair    THERAPY DIAG:  Acute pain of right shoulder  Stiffness of right  shoulder, not elsewhere classified  Muscle weakness (generalized)  Rationale for Evaluation and Treatment Rehabilitation  ONSET DATE: DOS 04/07/22  SUBJECTIVE:                                                                                                                                                                                      SUBJECTIVE STATEMENT: Pt is 8 weeks and 2 days s/p R reverse TSA and biceps tenodesis.  Pt states she felt good after prior Rx.  Pt reports her shoulder is hurting and stiff today.  "Maybe it's due to the cold weather."  She has difficulty with lowering R UE and still feels a pull.  HEP COMPLIANCE:  Pt reports compliance with HEP. RESPONSE TO PRIOR RX:  Pt denies any adverse effects after prior Rx. FUNCTIONAL IMPROVEMENTS:  Pt reports she is able to lift R UE higher and able to hold it up longer.  Donning shirt, buttoning shirts, lift 1/2 full cup of coffee, washing dishes, eating, brushing teeth. FUNCTIONAL LIMITATIONS:  doffing shirt, cooking, reaching activities.  Turning on light switch. Unable to perform overhead activities.  Limited per protocol.   PERTINENT HISTORY: H/o bil RCR R carpal tunnel release in July 2023  PAIN:  Are you having pain? Yes: NPRS scale: Current: 2-3/10, Worst:  6/10, Best:  2/10 Pain location: Rt anterior shoulder  Pain description: stinging Aggravating factors:   Relieving factors: pain meds  PRECAUTIONS: Shoulder, reverse TSA without subscap repair  WEIGHT BEARING RESTRICTIONS Yes NWB UE  FALLS:  Has patient fallen in last 6 months? No  LIVING ENVIRONMENT: Lives with: lives with their spouse No stairs  OCCUPATION: Not working  PLOF: Independent  PATIENT GOALS the use of my arm and hand back, gardening  OBJECTIVE:    TODAY'S TREATMENT:  Therapeutic Exercise: -Reviewed pain level, response to prior Rx, and HEP compliance. -Pt received R shoulder PROM in flexion, scaption, and ER per  protocol ranges  w/n pt and tissue tolerance. -Pt performed:  Pulleys in flexion and scaption x 20 each  Supine shoulder flexion AAROM with PT assist approx 4-5 reps  Supine flexion AROM 2 x 10 reps  Supine serratus punch 3x10  Seated wand ER 3x10  Supine wand ER 2x10  S/L shoulder abduction to 90 deg with minimal PT assist 2 x 10 reps   Prone extension to neutral 2 x 10 reps  Scap retraction with YTB 2x10  Attempted standing wall walks in flexion and ladder walks in flexion though pt unable to perform    R shoulder ROM: PROM:  ER:  42 deg ER AAROM:  25 deg   PT updated HEP and gave pt a HEP handout.  Educated pt in correct form and appropriate frequency.     PATIENT EDUCATION: Education details: relevant anatomy, protocol expectations and ROM limitations, POC, HEP, and exercise form/rationale. Person educated: Patient and Spouse Education method: Explanation, Demonstration, Tactile cues, Verbal cues, and Handouts Education comprehension: verbalized understanding, returned demonstration, verbal cues required, tactile cues required, and needs further education   HOME EXERCISE PROGRAM: [id number]  Updated HEP:  - Seated Shoulder External Rotation AAROM with [place]  - 2 x daily - 7 x weekly - 2 sets - 10 reps - Supine Single Arm Shoulder Protraction  - 1 x daily - 7 x weekly - 2 sets - 10 rep   ASSESSMENT:  CLINICAL IMPRESSION:     Pt is progressing with function and ROM.  Pt performed exercises per protocol well with cuing and instruction in correct form.  Pt has much improved ease and performance of supine wand flexion though does feel some tightness/pulling in shoulder.  Pt able to perform supine serratus punches without assistance, just required some cuing for positioning.  Attempted standing wall walks in flexion and ladder walks in flexion though pt unable to perform.  PT updated HEP and pt demonstrates good understanding.  Pt was tighter in ER with AAROM today though 1 deg better with PROM.   Pt tolerated PROM well.  She continues to be limited with reaching and is improving with lowering UE.  She responded well to Rx having no increased pain after Rx.  Pt should benefit from cont skilled PT services per protocol to address ongoing goals and to assist in restoring desired level of function.     OBJECTIVE IMPAIRMENTS decreased activity tolerance, decreased knowledge of condition, decreased ROM, decreased strength, increased muscle spasms, impaired flexibility, impaired UE functional use, improper body mechanics, postural dysfunction, and pain.   ACTIVITY LIMITATIONS carrying, lifting, sleeping, bed mobility, bathing, dressing, reach over head, and hygiene/grooming  PARTICIPATION LIMITATIONS: meal prep, cleaning, laundry, driving, shopping, and yard work  PERSONAL FACTORS Past/current experiences and see PMH  are also affecting patient's functional outcome.   REHAB POTENTIAL: Good  CLINICAL DECISION MAKING: Stable/uncomplicated  EVALUATION COMPLEXITY: Low   GOALS:  SHORT TERM GOALS: Target date: 05/09/2022   AROM flexion to 90 without incr pain and good scap control Baseline:see chart Goal status: met in supine, not assessed in standing  2.  ER in scapular plane to 30 deg Baseline: see chart Goal status: GOAL MET  3.  Pain levels <=3/10 Baseline: see subj reports Goal status:  ONGOING  4.  D/c sling Baseline: as tolerated at 3 wk Goal status: GOAL MET   LONG TERM GOALS: Target date: 05/23/2022    1.  Pt will meet FOTO goal Baseline: see above Goal status: ONGOING  Target date: 06/20/22  2.  AROM flexion to 130 without incr pain and good scap control Baseline:  Goal status: ONGOING Target date: 05/23/2022   3.  Use of arm for ADLs pain <=3/10 Baseline:  Goal status: ONGOING Target date: 05/23/2022   4.  Initiate functional IR behind back without incr pain Baseline: can begin at 6 weeks per post op protocol Goal status: NOT MET Target date: 05/23/2022    5.  Pt will demo R shoulder AROM to be [facility] t/o for performance of ADLs and IADLs.  Goal status: INITIAL Target date: 08/01/22  6.  Pt will be able to perform her ADLs and IADLs including reaching activities without significant pain and difficulty.  Goal status: INITIAL Target date: 08/01/22  7.  Pt will be able to reach into an overhead cabinet without significant difficulty.  Goal status: INITIAL Target date: 08/01/22    PLAN: PT FREQUENCY: 2x/wk   PT DURATION: 9 weeks  PLANNED INTERVENTIONS: Therapeutic exercises, Therapeutic activity, Neuromuscular re-education, Balance training, Gait training, Patient/Family education, Self Care, Joint mobilization, Aquatic Therapy, Dry Needling, Electrical stimulation, Spinal mobilization, Cryotherapy, Moist heat, scar mobilization, Taping, Vasopneumatic device, Manual therapy, and Re-evaluation  PLAN FOR NEXT SESSION: continue per Dr. Eddie Dibbles reverse TSA protocol.   Selinda Michaels III PT, DPT 06/05/22 7:42 AM

## 2022-06-04 ENCOUNTER — Encounter (HOSPITAL_BASED_OUTPATIENT_CLINIC_OR_DEPARTMENT_OTHER): Payer: Medicare Other | Admitting: Orthopaedic Surgery

## 2022-06-04 ENCOUNTER — Ambulatory Visit (HOSPITAL_BASED_OUTPATIENT_CLINIC_OR_DEPARTMENT_OTHER): Payer: Medicare Other | Admitting: Physical Therapy

## 2022-06-04 ENCOUNTER — Encounter (HOSPITAL_BASED_OUTPATIENT_CLINIC_OR_DEPARTMENT_OTHER): Payer: Self-pay | Admitting: Physical Therapy

## 2022-06-04 DIAGNOSIS — M25511 Pain in right shoulder: Secondary | ICD-10-CM

## 2022-06-04 DIAGNOSIS — M6281 Muscle weakness (generalized): Secondary | ICD-10-CM | POA: Diagnosis not present

## 2022-06-04 DIAGNOSIS — M25611 Stiffness of right shoulder, not elsewhere classified: Secondary | ICD-10-CM

## 2022-06-06 ENCOUNTER — Other Ambulatory Visit (HOSPITAL_BASED_OUTPATIENT_CLINIC_OR_DEPARTMENT_OTHER): Payer: Self-pay

## 2022-06-06 ENCOUNTER — Ambulatory Visit (INDEPENDENT_AMBULATORY_CARE_PROVIDER_SITE_OTHER): Payer: Medicare Other | Admitting: Orthopaedic Surgery

## 2022-06-06 ENCOUNTER — Ambulatory Visit (HOSPITAL_BASED_OUTPATIENT_CLINIC_OR_DEPARTMENT_OTHER): Payer: Medicare Other | Admitting: Physical Therapy

## 2022-06-06 ENCOUNTER — Encounter (HOSPITAL_BASED_OUTPATIENT_CLINIC_OR_DEPARTMENT_OTHER): Payer: Self-pay | Admitting: Physical Therapy

## 2022-06-06 DIAGNOSIS — M6281 Muscle weakness (generalized): Secondary | ICD-10-CM | POA: Diagnosis not present

## 2022-06-06 DIAGNOSIS — M25511 Pain in right shoulder: Secondary | ICD-10-CM | POA: Diagnosis not present

## 2022-06-06 DIAGNOSIS — M25611 Stiffness of right shoulder, not elsewhere classified: Secondary | ICD-10-CM | POA: Diagnosis not present

## 2022-06-06 DIAGNOSIS — Z9889 Other specified postprocedural states: Secondary | ICD-10-CM

## 2022-06-06 MED ORDER — METHOCARBAMOL 500 MG PO TABS
500.0000 mg | ORAL_TABLET | Freq: Four times a day (QID) | ORAL | 3 refills | Status: DC
  Filled 2022-06-06: qty 30, 8d supply, fill #0

## 2022-06-06 NOTE — Progress Notes (Signed)
Post Operative Evaluation    Procedure/Date of Surgery: Right shoulder reverse shoulder arthroplasty 04/07/22  Interval History:   Sonda presents today for right shoulder follow-up.  She is now 8 weeks status post right shoulder reverse shoulder arthroplasty overall doing extremely well.  Continues to make gains with active and passive range of motion.  She is continuing physical therapy here at the building.  Her pain is continuing to improve. PMH/PSH/Family History/Social History/Meds/Allergies:    Past Medical History:  Diagnosis Date   Anemia    Anginal pain ([place])    last cp was last year   Anxiety    Arthritis    Blood transfusion    Chest pain 02/04/2016   Coronary artery disease    Coronary artery spasm, wth continued episodes of chest pain.  09/26/2013   Coronary vasospasm (H[facility])    Diabetes mellitus without complication (HCC)    type II   GERD (gastroesophageal reflux disease)    Headache    Hiatal hernia    Hypercholesteremia    Hypertension    NSTEMI (non-ST elevated myocardial infarction) ([place]) not sure   NSVT (nonsustained ventricular tachycardia) ([place]) 09/26/2013   Panic attack    Past Surgical History:  Procedure Laterality Date   ABDOMINAL HYSTERECTOMY     PARTIAL HYSTERECTOMY   ACDF N/A    C5-6 ACDF Dr. Sherwood Gambler   breast     right, tumor benign   BREAST EXCISIONAL BIOPSY Right    1961 (age 11) benign   CARDIAC CATHETERIZATION  2014   CARDIAC CATHETERIZATION  2012   CARDIAC CATHETERIZATION N/A 07/22/2016   Procedure: Left Heart Cath and Coronary Angiography;  Surgeon: Belva Crome, MD;  Location: [place];  Service: Cardiovascular;  Laterality: N/A;   cardiac stent  2012   to RCA   CHOLECYSTECTOMY N/A 10/02/2017   Procedure: LAPAROSCOPIC CHOLECYSTECTOMY;  Surgeon: Ralene Ok, MD;  Location: [place];  Service: General;  Laterality: N/A;   CORONARY ANGIOPLASTY     ESOPHAGEAL MANOMETRY N/A 10/29/2016    Procedure: ESOPHAGEAL MANOMETRY (EM);  Surgeon: Garlan Fair, MD;  Location: [facility];  Service: Endoscopy;  Laterality: N/A;   ESOPHAGOGASTRODUODENOSCOPY (EGD) WITH PROPOFOL N/A 10/28/2016   Procedure: ESOPHAGOGASTRODUODENOSCOPY (EGD) WITH PROPOFOL;  Surgeon: Garlan Fair, MD;  Location: [facility];  Service: Endoscopy;  Laterality: N/A;   EXCISION OF SKIN TAG N/A 07/29/2017   Procedure: EXCISION OF PERIANAL  SKIN TAG;  Surgeon: Ralene Ok, MD;  Location: [place];  Service: General;  Laterality: N/A;   KNEE SURGERY  left   left ear surgery     for bad cut   LEFT HEART CATHETERIZATION WITH CORONARY ANGIOGRAM N/A 02/22/2013   Procedure: LEFT HEART CATHETERIZATION WITH CORONARY ANGIOGRAM;  Surgeon: [name], MD;  Location: [facility];  Service: Cardiovascular;  Laterality: N/A;   REVERSE SHOULDER ARTHROPLASTY Right 04/07/2022   Procedure: RIGHT SHOULDER REVERSE SHOULDER ARTHROPLASTY;  Surgeon: Vanetta Mulders, MD;  Location: [place];  Service: Orthopedics;  Laterality: Right;   SHOULDER SURGERY Bilateral    rotator cuff   TOE SURGERY Left    toe surgery   Social History   Socioeconomic History   Marital status: Married    Spouse name: [name]   Number of children: 0   Years of  education: Not on file   Highest education level: Not on file  Occupational History   Not on file  Tobacco Use   Smoking status: Every Day    Packs/day: 0.50    Years: 40.00    Total pack years: 20.00    Types: Cigarettes    Last attempt to quit: 01/10/2016    Years since quitting: 6.4   Smokeless tobacco: Never   Tobacco comments:    Pt reports she smokes a pack or less per week of cigarettes  Vaping Use   Vaping Use: Never used  Substance and Sexual Activity   Alcohol use: Yes    Alcohol/week: 1.0 standard drink of alcohol    Types: 1 Cans of beer per week    Comment: occasional beer   Drug use: No   Sexual activity: Never  Other Topics Concern   Not on file  Social History  Narrative   Lives with husband. Ambulates independently.   Social Determinants of Health   Financial Resource Strain: Not on file  Food Insecurity: No Food Insecurity (03/24/2022)   Hunger Vital Sign    Worried About Running Out of Food in the Last Year: Never true    Ran Out of Food in the Last Year: Never true  Transportation Needs: No Transportation Needs (03/24/2022)   PRAPARE - Hydrologist (Medical): No    Lack of Transportation (Non-Medical): No  Physical Activity: Not on file  Stress: Not on file  Social Connections: Not on file   Family History  Problem Relation Age of Onset   Heart failure Mother    Stroke Sister    Heart attack Other    No Known Allergies Current Outpatient Medications  Medication Sig Dispense Refill   methocarbamol (ROBAXIN) 500 MG tablet Take 1 tablet (500 mg total) by mouth 4 (four) times daily. 30 tablet 3   ACCU-CHEK GUIDE test strip USE TO CHECK BLOOD SUGAR DAILY     Accu-Chek Softclix Lancets lancets daily.     amLODipine (NORVASC) 2.5 MG tablet Take 1 tablet (2.5 mg total) by mouth daily. 30 tablet 1   aspirin EC 325 MG tablet Take 1 tablet (325 mg total) by mouth daily. 30 tablet 0   buPROPion (ZYBAN) 150 MG 12 hr tablet Take 150 mg by mouth every morning.     dapagliflozin propanediol (FARXIGA) 10 MG TABS tablet Take 1 tablet (10 mg total) by mouth daily before breakfast. 30 tablet 11   HYDROcodone-acetaminophen (NORCO/VICODIN) 5-325 MG tablet Take 1 tablet by mouth every 6 (six) hours as needed for moderate pain. 30 tablet 0   HYDROcodone-acetaminophen (NORCO/VICODIN) 5-325 MG tablet Take 1 tablet by mouth every 6 (six) hours as needed for moderate pain. 30 tablet 0   metFORMIN (GLUCOPHAGE-XR) 500 MG 24 hr tablet Take 500 mg by mouth 2 (two) times daily.     methocarbamol (ROBAXIN) 500 MG tablet Take 1 tablet (500 mg total) by mouth 4 (four) times daily. 30 tablet 3   nitroGLYCERIN (NITROSTAT) 0.4 MG SL tablet  PLACE 1 TABLET UNDER TONGUE AS NEEDED FOR CHEST PAIN EVERY 5 MINUTES UP TO 3 DOSES THEN SEEK MEDICAL ATTENTION. Please make overdue appt. 1st attempt 25 tablet 0   ondansetron (ZOFRAN-ODT) 8 MG disintegrating tablet Take 1 tablet (8 mg total) by mouth every 8 (eight) hours as needed for nausea or vomiting. 10 tablet 0   oxyCODONE (ROXICODONE) 5 MG immediate release tablet Take 1 tablet (5 mg  total) by mouth every 4 (four) hours as needed for severe pain or breakthrough pain. 30 tablet 0   pantoprazole (PROTONIX) 40 MG tablet Take 1 tablet (40 mg total) by mouth 2 (two) times daily. 60 tablet 2   pregabalin (LYRICA) 50 MG capsule Take 1 cap by mouth at night for 7 nights then take 1 cap by mouth in the morning and 1 at night. (Patient not taking: Reported on 03/17/2022) 30 capsule 1   ranolazine (RANEXA) 500 MG 12 hr tablet TAKE 1 TABLET(500 MG) BY MOUTH TWICE DAILY (Patient not taking: Reported on 03/17/2022) 60 tablet 11   rosuvastatin (CRESTOR) 5 MG tablet Take 1 tablet (5 mg total) by mouth daily at 6 PM. 30 tablet 1   senna-docusate (SENOKOT-S) 8.6-50 MG tablet Take 1 tablet by mouth at bedtime as needed for mild constipation or moderate constipation. (Patient not taking: Reported on 03/17/2022) 20 tablet 0   traZODone (DESYREL) 50 MG tablet Take 100 mg by mouth at bedtime as needed for sleep.     No current facility-administered medications for this visit.   No results found.  Review of Systems:   A ROS was performed including pertinent positives and negatives as documented in the HPI.   Musculoskeletal Exam:    There were no vitals taken for this visit.  Right incision is well-healed.  In the supine position she has passively able to elevate to 120 degrees and then coached she is able to actively elevate to 110 degrees.  External rotation at the side is to 30 degrees actively.  Able to flex and extend at the right elbow.  Internal rotation is to back pocket.  Sensation is intact distally with  2+ radial pulse  Imaging:    3 views right shoulder: Status post right shoulder reverse shoulder arthroplasty without evidence of hardware complication  I personally reviewed and interpreted the radiographs.   Assessment:   76 year old female 8 weeks overall doing very.  All activity restrictions limitations were discussed.  She will continue to work on active range of motion as well as strengthening.  I will plan to see her back in 8 weeks for reassessment Plan :    -Return to clinic in 8 weeks      I personally saw and evaluated the patient, and participated in the management and treatment plan.  Vanetta Mulders, MD Attending Physician, Orthopedic Surgery  This document was dictated using Dragon voice recognition software. A reasonable attempt at proof reading has been made to minimize errors.

## 2022-06-06 NOTE — Therapy (Signed)
OUTPATIENT PHYSICAL THERAPY SHOULDER TREATMENT   Patient Name: [name] MRN: [id number] DOB:1946-04-11, 76 y.o., female Today's Date: 06/06/2022   PT End of Session - 06/06/22 1049     Visit Number 12    Number of Visits 28    Date for PT Re-Evaluation 08/01/22    Authorization Type UHC MCR    Progress Note Due on Visit 20    PT Start Time 1015    PT Stop Time 1057    PT Time Calculation (min) 42 min    Activity Tolerance Patient tolerated treatment well    Behavior During Therapy WFL for tasks assessed/performed                      Past Medical History:  Diagnosis Date   Anemia    Anginal pain ([place])    last cp was last year   Anxiety    Arthritis    Blood transfusion    Chest pain 02/04/2016   Coronary artery disease    Coronary artery spasm, wth continued episodes of chest pain.  09/26/2013   Coronary vasospasm (HCC)    Diabetes mellitus without complication (HCC)    type II   GERD (gastroesophageal reflux disease)    Headache    Hiatal hernia    Hypercholesteremia    Hypertension    NSTEMI (non-ST elevated myocardial infarction) ([place]) not sure   NSVT (nonsustained ventricular tachycardia) ([place]) 09/26/2013   Panic attack    Past Surgical History:  Procedure Laterality Date   ABDOMINAL HYSTERECTOMY     PARTIAL HYSTERECTOMY   ACDF N/A    C5-6 ACDF Dr. Sherwood Gambler   breast     right, tumor benign   BREAST EXCISIONAL BIOPSY Right    1961 (age 10) benign   CARDIAC CATHETERIZATION  2014   CARDIAC CATHETERIZATION  2012   CARDIAC CATHETERIZATION N/A 07/22/2016   Procedure: Left Heart Cath and Coronary Angiography;  Surgeon: Belva Crome, MD;  Location: [place];  Service: Cardiovascular;  Laterality: N/A;   cardiac stent  2012   to RCA   CHOLECYSTECTOMY N/A 10/02/2017   Procedure: LAPAROSCOPIC CHOLECYSTECTOMY;  Surgeon: Ralene Ok, MD;  Location: [place] [facility];  Service: General;  Laterality: N/A;   CORONARY ANGIOPLASTY      ESOPHAGEAL MANOMETRY N/A 10/29/2016   Procedure: ESOPHAGEAL MANOMETRY (EM);  Surgeon: Garlan Fair, MD;  [facility];  Service: Endoscopy;  Laterality: N/A;   ESOPHAGOGASTRODUODENOSCOPY (EGD) WITH PROPOFOL N/A 10/28/2016   Procedure: ESOPHAGOGASTRODUODENOSCOPY (EGD) WITH PROPOFOL;  Surgeon: Garlan Fair, MD;  [facility];  Service: Endoscopy;  Laterality: N/A;   EXCISION OF SKIN TAG N/A 07/29/2017   Procedure: EXCISION OF PERIANAL  SKIN TAG;  Surgeon: Ralene Ok, MD;  Location: [place];  Service: General;  Laterality: N/A;   KNEE SURGERY  left   left ear surgery     for bad cut   LEFT HEART CATHETERIZATION WITH CORONARY ANGIOGRAM N/A 02/22/2013   Procedure: LEFT HEART CATHETERIZATION WITH CORONARY ANGIOGRAM;  Surgeon: [name], MD;  Location: [facility];  Service: Cardiovascular;  Laterality: N/A;   REVERSE SHOULDER ARTHROPLASTY Right 04/07/2022   Procedure: RIGHT SHOULDER REVERSE SHOULDER ARTHROPLASTY;  Surgeon: Vanetta Mulders, MD;  Location: [place];  Service: Orthopedics;  Laterality: Right;   SHOULDER SURGERY Bilateral    rotator cuff   TOE SURGERY Left    toe surgery   Patient Active Problem List   Diagnosis  Date Noted   Rotator cuff arthropathy of right shoulder    Carpal tunnel syndrome, right upper limb 01/03/2022   Chest pain 06/21/2021   Variant angina ([place]) 01/24/2017   Precordial chest pain 01/23/2017   Headache 01/23/2017   S/P angioplasty with stent 01/23/2017   Coronary artery disease 11/24/2014   Syncope 10/22/2014   Hyperglycemia 04/20/2014   H/O hiatal hernia 04/19/2014   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    Coronary artery vasospasm ([place]) 09/26/2013   HTN (hypertension) 12/08/2011   GERD (gastroesophageal reflux disease) 12/08/2011   Depression 12/08/2011   Dyslipidemia 12/08/2011     REFERRING PROVIDER: Vanetta Mulders MD  REFERRING DIAG: s/p Rt reverse TSA + biceps tenodesis, without subscap repair    THERAPY DIAG:  Acute pain of  right shoulder  Stiffness of right shoulder, not elsewhere classified  Muscle weakness (generalized)  Rationale for Evaluation and Treatment Rehabilitation  ONSET DATE: DOS 04/07/22  SUBJECTIVE:                                                                                                                                                                                      SUBJECTIVE STATEMENT: Pt is 8 weeks and 4 days s/p R reverse TSA and biceps tenodesis.  Pt states she felt good last Rx and was able to perform the exercises at Rx and that evening.  She had increased pain the following day and was not able to perform the exercises.  She reports having that same pain lowering her arm.  She c/o's of pain in R hand which kept her up last night.  Pt has soreness in index and middle finger and wrist.  She saw MD today and informed him of wrist/hand pain and shoulder stiffness.  Pt states MD pushed her shoulder and she had pain.  She thinks MD wants her to push her ROM.        HEP COMPLIANCE:  Pt reports compliance with HEP. RESPONSE TO PRIOR RX:  Pt denies any adverse effects after prior Rx. FUNCTIONAL IMPROVEMENTS:  Pt reports she is able to lift R UE higher and able to hold it up longer.  Donning shirt, buttoning shirts, lift 1/2 full cup of coffee, washing dishes, eating, brushing teeth. FUNCTIONAL LIMITATIONS:  doffing shirt, cooking, reaching activities.  Turning on light switch. Unable to perform overhead activities.  Limited per protocol.   PERTINENT HISTORY: H/o bil RCR R carpal tunnel release in July 2023  PAIN:  Are you having pain? Yes: NPRS scale: Current: 4/10, Worst:  6/10, Best:  2/10 Pain location: Rt anterior shoulder  Pain description: stinging Aggravating factors:   Relieving factors: pain meds  PRECAUTIONS:  Shoulder, reverse TSA without subscap repair  WEIGHT BEARING RESTRICTIONS Yes NWB UE  FALLS:  Has patient fallen in last 6 months? No  LIVING  ENVIRONMENT: Lives with: lives with their spouse No stairs  OCCUPATION: Not working  PLOF: Independent  PATIENT GOALS the use of my arm and hand back, gardening  OBJECTIVE:    TODAY'S TREATMENT:  Therapeutic Exercise: -Reviewed pain level, response to prior Rx, and HEP compliance. -Pt received R shoulder PROM in flexion, scaption, and ER per protocol ranges w/n pt and tissue tolerance. -Pt performed:  Pulleys in flexion and scaption x 20 each  Supine flexion AROM with PT assistance 2 x 10 reps  Supine serratus punch 2x10, 1x5  Seated wand ER 2x10  Supine wand ER 2x10  S/L shoulder abduction to 90 deg with minimal PT assist 2 x 10 reps   Scap retraction with YTB 2x10  Supine ER AROM 2x10  Attempted standing wall walks in flexion and ladder walks in flexion though pt unable to perform    R shoulder ROM: PROM:  ER:  42 deg ER AAROM:  25 deg   PT updated HEP and gave pt a HEP handout.  Educated pt in correct form and appropriate frequency.     PATIENT EDUCATION: Education details: relevant anatomy, protocol expectations and ROM limitations, POC, HEP, and exercise form/rationale. Person educated: Patient and Spouse Education method: Explanation, Demonstration, Tactile cues, Verbal cues, and Handouts Education comprehension: verbalized understanding, returned demonstration, verbal cues required, tactile cues required, and needs further education   HOME EXERCISE PROGRAM: [email or web address]  Updated HEP:  - Seated Shoulder External Rotation AAROM with [place]  - 2 x daily - 7 x weekly - 2 sets - 10 reps - Supine Single Arm Shoulder Protraction  - 1 x daily - 7 x weekly - 2 sets - 10 rep   ASSESSMENT:  CLINICAL IMPRESSION:     Pt presents to Rx reporting increased pain and difficulty with shoulder mobility compared to prior Rx.  Pt had more difficulty with ROM exercises today including having pain with lowering R UE which improved with increased repetitions.  She had some pain  with lowering arm on 1st set of supine shoulder flexion with PT assistance and S/L abd with PT assistance though had improved pain and ease with 2nd set.  Pt required much cuing for correct form with supine serratus punch and also fatigued by 3rd set.  She tolerated PROM well and has tightness at end ranges.  Pt responded well to Rx stating she was feeling much better and reported improved shoulder mobility after Rx.  She also reports improved pain from 4/10 before Rx to 3/10 after rx.  Pt should benefit from cont skilled PT services per protocol to address ongoing goals and to assist in restoring desired level of function.      OBJECTIVE IMPAIRMENTS decreased activity tolerance, decreased knowledge of condition, decreased ROM, decreased strength, increased muscle spasms, impaired flexibility, impaired UE functional use, improper body mechanics, postural dysfunction, and pain.   ACTIVITY LIMITATIONS carrying, lifting, sleeping, bed mobility, bathing, dressing, reach over head, and hygiene/grooming  PARTICIPATION LIMITATIONS: meal prep, cleaning, laundry, driving, shopping, and yard work  PERSONAL FACTORS Past/current experiences and see PMH  are also affecting patient's functional outcome.   REHAB POTENTIAL: Good  CLINICAL DECISION MAKING: Stable/uncomplicated  EVALUATION COMPLEXITY: Low   GOALS:  SHORT TERM GOALS: Target date: 05/09/2022   AROM flexion to 90 without incr pain and good scap control Baseline:see chart  Goal status: met in supine, not assessed in standing  2.  ER in scapular plane to 30 deg Baseline: see chart Goal status: GOAL MET  3.  Pain levels <=3/10 Baseline: see subj reports Goal status:  ONGOING  4.  D/c sling Baseline: as tolerated at 3 wk Goal status: GOAL MET   LONG TERM GOALS: Target date: 05/23/2022    1.  Pt will meet FOTO goal Baseline: see above Goal status: ONGOING Target date: 06/20/22  2.  AROM flexion to 130 without incr pain and good  scap control Baseline:  Goal status: ONGOING Target date: 05/23/2022   3.  Use of arm for ADLs pain <=3/10 Baseline:  Goal status: ONGOING Target date: 05/23/2022   4.  Initiate functional IR behind back without incr pain Baseline: can begin at 6 weeks per post op protocol Goal status: NOT MET Target date: 05/23/2022   5.  Pt will demo R shoulder AROM to be [facility] t/o for performance of ADLs and IADLs.  Goal status: INITIAL Target date: 08/01/22  6.  Pt will be able to perform her ADLs and IADLs including reaching activities without significant pain and difficulty.  Goal status: INITIAL Target date: 08/01/22  7.  Pt will be able to reach into an overhead cabinet without significant difficulty.  Goal status: INITIAL Target date: 08/01/22    PLAN: PT FREQUENCY: 2x/wk   PT DURATION: 9 weeks  PLANNED INTERVENTIONS: Therapeutic exercises, Therapeutic activity, Neuromuscular re-education, Balance training, Gait training, Patient/Family education, Self Care, Joint mobilization, Aquatic Therapy, Dry Needling, Electrical stimulation, Spinal mobilization, Cryotherapy, Moist heat, scar mobilization, Taping, Vasopneumatic device, Manual therapy, and Re-evaluation  PLAN FOR NEXT SESSION: continue per Dr. Eddie Dibbles reverse TSA protocol.   Selinda Michaels III PT, DPT 06/06/22 11:22 AM

## 2022-06-10 DIAGNOSIS — E1169 Type 2 diabetes mellitus with other specified complication: Secondary | ICD-10-CM | POA: Diagnosis not present

## 2022-06-10 DIAGNOSIS — Z23 Encounter for immunization: Secondary | ICD-10-CM | POA: Diagnosis not present

## 2022-06-10 DIAGNOSIS — I739 Peripheral vascular disease, unspecified: Secondary | ICD-10-CM | POA: Diagnosis not present

## 2022-06-10 DIAGNOSIS — I2510 Atherosclerotic heart disease of native coronary artery without angina pectoris: Secondary | ICD-10-CM | POA: Diagnosis not present

## 2022-06-10 DIAGNOSIS — D509 Iron deficiency anemia, unspecified: Secondary | ICD-10-CM | POA: Diagnosis not present

## 2022-06-10 DIAGNOSIS — M519 Unspecified thoracic, thoracolumbar and lumbosacral intervertebral disc disorder: Secondary | ICD-10-CM | POA: Diagnosis not present

## 2022-06-10 DIAGNOSIS — K219 Gastro-esophageal reflux disease without esophagitis: Secondary | ICD-10-CM | POA: Diagnosis not present

## 2022-06-10 DIAGNOSIS — I700 Atherosclerosis of aorta: Secondary | ICD-10-CM | POA: Diagnosis not present

## 2022-06-10 DIAGNOSIS — G4700 Insomnia, unspecified: Secondary | ICD-10-CM | POA: Diagnosis not present

## 2022-06-10 DIAGNOSIS — I10 Essential (primary) hypertension: Secondary | ICD-10-CM | POA: Diagnosis not present

## 2022-06-11 ENCOUNTER — Telehealth: Payer: Self-pay | Admitting: Interventional Cardiology

## 2022-06-11 ENCOUNTER — Ambulatory Visit (HOSPITAL_BASED_OUTPATIENT_CLINIC_OR_DEPARTMENT_OTHER): Payer: Medicare Other | Attending: Orthopaedic Surgery | Admitting: Physical Therapy

## 2022-06-11 DIAGNOSIS — M25511 Pain in right shoulder: Secondary | ICD-10-CM | POA: Insufficient documentation

## 2022-06-11 DIAGNOSIS — M6281 Muscle weakness (generalized): Secondary | ICD-10-CM | POA: Insufficient documentation

## 2022-06-11 DIAGNOSIS — M25611 Stiffness of right shoulder, not elsewhere classified: Secondary | ICD-10-CM | POA: Diagnosis not present

## 2022-06-11 NOTE — Therapy (Signed)
OUTPATIENT PHYSICAL THERAPY SHOULDER TREATMENT   Patient Name: [name] MRN: [id number] DOB:12/20/1945, 76 y.o., female Today's Date: 06/12/2022   PT End of Session - 06/11/22 1108     Visit Number 13    Number of Visits 28    Date for PT Re-Evaluation 08/01/22    Authorization Type UHC MCR    PT Start Time 1020    PT Stop Time 1102    PT Time Calculation (min) 42 min    Activity Tolerance Patient tolerated treatment well    Behavior During Therapy WFL for tasks assessed/performed               Past Medical History:  Diagnosis Date   Anemia    Anginal pain ([place])    last cp was last year   Anxiety    Arthritis    Blood transfusion    Chest pain 02/04/2016   Coronary artery disease    Coronary artery spasm, wth continued episodes of chest pain.  09/26/2013   Coronary vasospasm (HCC)    Diabetes mellitus without complication (HCC)    type II   GERD (gastroesophageal reflux disease)    Headache    Hiatal hernia    Hypercholesteremia    Hypertension    NSTEMI (non-ST elevated myocardial infarction) ([place]) not sure   NSVT (nonsustained ventricular tachycardia) ([place]) 09/26/2013   Panic attack    Past Surgical History:  Procedure Laterality Date   ABDOMINAL HYSTERECTOMY     PARTIAL HYSTERECTOMY   ACDF N/A    C5-6 ACDF Dr. Sherwood Gambler   breast     right, tumor benign   BREAST EXCISIONAL BIOPSY Right    1961 (age 71) benign   CARDIAC CATHETERIZATION  2014   CARDIAC CATHETERIZATION  2012   CARDIAC CATHETERIZATION N/A 07/22/2016   Procedure: Left Heart Cath and Coronary Angiography;  Surgeon: Belva Crome, MD;  Location: [place] [facility];  Service: Cardiovascular;  Laterality: N/A;   cardiac stent  2012   to RCA   CHOLECYSTECTOMY N/A 10/02/2017   Procedure: LAPAROSCOPIC CHOLECYSTECTOMY;  Surgeon: Ralene Ok, MD;  Location: [place];  Service: General;  Laterality: N/A;   CORONARY ANGIOPLASTY     ESOPHAGEAL MANOMETRY N/A 10/29/2016   Procedure:  ESOPHAGEAL MANOMETRY (EM);  Surgeon: Garlan Fair, MD;  [facility];  Service: Endoscopy;  Laterality: N/A;   ESOPHAGOGASTRODUODENOSCOPY (EGD) WITH PROPOFOL N/A 10/28/2016   Procedure: ESOPHAGOGASTRODUODENOSCOPY (EGD) WITH PROPOFOL;  Surgeon: Garlan Fair, MD;  [facility];  Service: Endoscopy;  Laterality: N/A;   EXCISION OF SKIN TAG N/A 07/29/2017   Procedure: EXCISION OF PERIANAL  SKIN TAG;  Surgeon: Ralene Ok, MD;  Location: [place];  Service: General;  Laterality: N/A;   KNEE SURGERY  left   left ear surgery     for bad cut   LEFT HEART CATHETERIZATION WITH CORONARY ANGIOGRAM N/A 02/22/2013   Procedure: LEFT HEART CATHETERIZATION WITH CORONARY ANGIOGRAM;  Surgeon: [name], MD;  Location: [facility];  Service: Cardiovascular;  Laterality: N/A;   REVERSE SHOULDER ARTHROPLASTY Right 04/07/2022   Procedure: RIGHT SHOULDER REVERSE SHOULDER ARTHROPLASTY;  Surgeon: Vanetta Mulders, MD;  Location: [place];  Service: Orthopedics;  Laterality: Right;   SHOULDER SURGERY Bilateral    rotator cuff   TOE SURGERY Left    toe surgery   Patient Active Problem List   Diagnosis Date Noted   Rotator cuff arthropathy of right shoulder    Carpal tunnel syndrome,  right upper limb 01/03/2022   Chest pain 06/21/2021   Variant angina ([place]) 01/24/2017   Precordial chest pain 01/23/2017   Headache 01/23/2017   S/P angioplasty with stent 01/23/2017   Coronary artery disease 11/24/2014   Syncope 10/22/2014   Hyperglycemia 04/20/2014   H/O hiatal hernia 04/19/2014   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    Coronary artery vasospasm ([place]) 09/26/2013   HTN (hypertension) 12/08/2011   GERD (gastroesophageal reflux disease) 12/08/2011   Depression 12/08/2011   Dyslipidemia 12/08/2011     REFERRING PROVIDER: Vanetta Mulders MD  REFERRING DIAG: s/p Rt reverse TSA + biceps tenodesis, without subscap repair    THERAPY DIAG:  Acute pain of right shoulder  Stiffness of right shoulder, not  elsewhere classified  Muscle weakness (generalized)  Rationale for Evaluation and Treatment Rehabilitation  ONSET DATE: DOS 04/07/22  SUBJECTIVE:                                                                                                                                                                                      SUBJECTIVE STATEMENT: Pt is 9 weeks and 2 days s/p R reverse TSA and biceps tenodesis.  She reports having that same pain lowering her arm though is better. Pt states her pain is a 4/10 today due to the cold weather.  Pt states her shoulder is feeling better and she is able to move it better.  Pt c/o's of wrist pain, shooting pain in fingers, and finger stiffness which may be from carpal tunnel.    HEP COMPLIANCE:  Pt reports compliance with HEP. RESPONSE TO PRIOR RX:  Pt denies any adverse effects after prior Rx. FUNCTIONAL IMPROVEMENTS:  Pt reports she is able to lift R UE higher and able to hold it up longer.  Donning shirt, buttoning shirts, lift 1/2 full cup of coffee, washing dishes, eating, brushing teeth. FUNCTIONAL LIMITATIONS:  doffing shirt, cooking, reaching activities.  Turning on light switch. Unable to perform overhead activities.  Limited per protocol.   PERTINENT HISTORY: H/o bil RCR R carpal tunnel release in July 2023  PAIN:  Are you having pain? Yes: NPRS scale: Current: 4/10, Worst:  6/10, Best:  2/10 Pain location: Rt anterior shoulder  Pain description: stinging Aggravating factors:   Relieving factors: pain meds  PRECAUTIONS: Shoulder, reverse TSA without subscap repair  WEIGHT BEARING RESTRICTIONS Yes NWB UE  FALLS:  Has patient fallen in last 6 months? No  LIVING ENVIRONMENT: Lives with: lives with their spouse No stairs  OCCUPATION: Not working  PLOF: Independent  PATIENT GOALS the use of my arm and hand back, gardening  OBJECTIVE:    TODAY'S TREATMENT:  Therapeutic Exercise: -Reviewed  pain level, response to prior  Rx, and HEP compliance. -Pt received R shoulder PROM in flexion, scaption, and ER per protocol ranges w/n pt and tissue tolerance. -Pt performed:  Pulleys in flexion and scaption x 20 each  Supine flexion AROM 2 x 10 reps  Supine serratus punch x20  Supine wand ER 2x10  S/L shoulder abduction to 90 deg with minimal PT assist 2 x 10 reps   Scap retraction with YTB 2x10  Supine ER AROM 2x10  S/L ER 2x10  Attempted standing wall walks in flexion and standing wand flexion in front of mirror though pt unable to perform  Supine flexion with head elevated with PT assistance x 10 reps    R shoulder ROM: AROM:  ER:  44 deg    PATIENT EDUCATION: Education details: relevant anatomy, protocol expectations and ROM limitations, POC, HEP, and exercise form/rationale. Person educated: Patient and Spouse Education method: Explanation, Demonstration, Tactile cues, Verbal cues, and Handouts Education comprehension: verbalized understanding, returned demonstration, verbal cues required, tactile cues required, and needs further education   HOME EXERCISE PROGRAM: [email or web address]     ASSESSMENT:  CLINICAL IMPRESSION: Pt is improving with R shoulder ROM including AROM.  Pt demonstrates improved ER AROM today.  She demonstrates much improved ease with ROM exercises and less pain compared to prior Rx.  She is able to perform supine shoulder flexion AROM independently and had improved ease and form with supine serratus punch.  Pt able to perform S/L ER well.  She is unable to perform standing flexion AAROM.  PT attempted standing AAROM with wall walks and cane though pt unable to perform.  She also required assistance with performing supine flexion AROM with head elevated.  Pt responded well to Rx having no c/o's after Rx.  Pt should benefit from cont skilled PT services per protocol to address ongoing goals and to assist in restoring desired level of function.      OBJECTIVE IMPAIRMENTS decreased activity  tolerance, decreased knowledge of condition, decreased ROM, decreased strength, increased muscle spasms, impaired flexibility, impaired UE functional use, improper body mechanics, postural dysfunction, and pain.   ACTIVITY LIMITATIONS carrying, lifting, sleeping, bed mobility, bathing, dressing, reach over head, and hygiene/grooming  PARTICIPATION LIMITATIONS: meal prep, cleaning, laundry, driving, shopping, and yard work  PERSONAL FACTORS Past/current experiences and see PMH  are also affecting patient's functional outcome.   REHAB POTENTIAL: Good  CLINICAL DECISION MAKING: Stable/uncomplicated  EVALUATION COMPLEXITY: Low   GOALS:  SHORT TERM GOALS: Target date: 05/09/2022   AROM flexion to 90 without incr pain and good scap control Baseline:see chart Goal status: met in supine, not assessed in standing  2.  ER in scapular plane to 30 deg Baseline: see chart Goal status: GOAL MET  3.  Pain levels <=3/10 Baseline: see subj reports Goal status:  ONGOING  4.  D/c sling Baseline: as tolerated at 3 wk Goal status: GOAL MET   LONG TERM GOALS: Target date: 05/23/2022    1.  Pt will meet FOTO goal Baseline: see above Goal status: ONGOING Target date: 06/20/22  2.  AROM flexion to 130 without incr pain and good scap control Baseline:  Goal status: ONGOING Target date: 05/23/2022   3.  Use of arm for ADLs pain <=3/10 Baseline:  Goal status: ONGOING Target date: 05/23/2022   4.  Initiate functional IR behind back without incr pain Baseline: can begin at 6 weeks per post op protocol Goal status: NOT MET Target date: 05/23/2022  5.  Pt will demo R shoulder AROM to be [facility] t/o for performance of ADLs and IADLs.  Goal status: INITIAL Target date: 08/01/22  6.  Pt will be able to perform her ADLs and IADLs including reaching activities without significant pain and difficulty.  Goal status: INITIAL Target date: 08/01/22  7.  Pt will be able to reach into an overhead  cabinet without significant difficulty.  Goal status: INITIAL Target date: 08/01/22    PLAN: PT FREQUENCY: 2x/wk   PT DURATION: 9 weeks  PLANNED INTERVENTIONS: Therapeutic exercises, Therapeutic activity, Neuromuscular re-education, Balance training, Gait training, Patient/Family education, Self Care, Joint mobilization, Aquatic Therapy, Dry Needling, Electrical stimulation, Spinal mobilization, Cryotherapy, Moist heat, scar mobilization, Taping, Vasopneumatic device, Manual therapy, and Re-evaluation  PLAN FOR NEXT SESSION: continue per Dr. Eddie Dibbles reverse TSA protocol.   Selinda Michaels III PT, DPT 06/12/22 9:50 AM

## 2022-06-11 NOTE — Telephone Encounter (Signed)
New Message:    [name] says she needs a new prescription for Bee faxed to the manufacturer please. Please fax to [phone].

## 2022-06-12 ENCOUNTER — Encounter (HOSPITAL_BASED_OUTPATIENT_CLINIC_OR_DEPARTMENT_OTHER): Payer: Self-pay | Admitting: Physical Therapy

## 2022-06-13 ENCOUNTER — Encounter (HOSPITAL_BASED_OUTPATIENT_CLINIC_OR_DEPARTMENT_OTHER): Payer: Self-pay | Admitting: Physical Therapy

## 2022-06-13 ENCOUNTER — Ambulatory Visit (HOSPITAL_BASED_OUTPATIENT_CLINIC_OR_DEPARTMENT_OTHER): Payer: Medicare Other | Admitting: Physical Therapy

## 2022-06-13 DIAGNOSIS — M6281 Muscle weakness (generalized): Secondary | ICD-10-CM

## 2022-06-13 DIAGNOSIS — M25511 Pain in right shoulder: Secondary | ICD-10-CM

## 2022-06-13 DIAGNOSIS — M25611 Stiffness of right shoulder, not elsewhere classified: Secondary | ICD-10-CM

## 2022-06-13 NOTE — Therapy (Signed)
OUTPATIENT PHYSICAL THERAPY SHOULDER TREATMENT   Patient Name: [name] MRN: [id number] DOB:January 26, 1946, 76 y.o., female Today's Date: 06/13/2022   PT End of Session - 06/13/22 1103     Visit Number 14    Number of Visits 28    Date for PT Re-Evaluation 08/01/22    Authorization Type UHC MCR    Progress Note Due on Visit 20    PT Start Time 1015    PT Stop Time 1059    PT Time Calculation (min) 44 min    Activity Tolerance Patient tolerated treatment well    Behavior During Therapy WFL for tasks assessed/performed                Past Medical History:  Diagnosis Date   Anemia    Anginal pain ([place])    last cp was last year   Anxiety    Arthritis    Blood transfusion    Chest pain 02/04/2016   Coronary artery disease    Coronary artery spasm, wth continued episodes of chest pain.  09/26/2013   Coronary vasospasm (H[facility])    Diabetes mellitus without complication (HCC)    type II   GERD (gastroesophageal reflux disease)    Headache    Hiatal hernia    Hypercholesteremia    Hypertension    NSTEMI (non-ST elevated myocardial infarction) ([place]) not sure   NSVT (nonsustained ventricular tachycardia) ([place]) 09/26/2013   Panic attack    Past Surgical History:  Procedure Laterality Date   ABDOMINAL HYSTERECTOMY     PARTIAL HYSTERECTOMY   ACDF N/A    C5-6 ACDF Dr. Sherwood Gambler   breast     right, tumor benign   BREAST EXCISIONAL BIOPSY Right    1961 (age 22) benign   CARDIAC CATHETERIZATION  2014   CARDIAC CATHETERIZATION  2012   CARDIAC CATHETERIZATION N/A 07/22/2016   Procedure: Left Heart Cath and Coronary Angiography;  Surgeon: Belva Crome, MD;  Location: [facility];  Service: Cardiovascular;  Laterality: N/A;   cardiac stent  2012   to RCA   CHOLECYSTECTOMY N/A 10/02/2017   Procedure: LAPAROSCOPIC CHOLECYSTECTOMY;  Surgeon: Ralene Ok, MD;  Location: [facility];  Service: General;  Laterality: N/A;   CORONARY ANGIOPLASTY     ESOPHAGEAL MANOMETRY  N/A 10/29/2016   Procedure: ESOPHAGEAL MANOMETRY (EM);  Surgeon: Garlan Fair, MD;  Location: [facility];  Service: Endoscopy;  Laterality: N/A;   ESOPHAGOGASTRODUODENOSCOPY (EGD) WITH PROPOFOL N/A 10/28/2016   Procedure: ESOPHAGOGASTRODUODENOSCOPY (EGD) WITH PROPOFOL;  Surgeon: Garlan Fair, MD;  Location: [facility];  Service: Endoscopy;  Laterality: N/A;   EXCISION OF SKIN TAG N/A 07/29/2017   Procedure: EXCISION OF PERIANAL  SKIN TAG;  Surgeon: Ralene Ok, MD;  Location: [facility];  Service: General;  Laterality: N/A;   KNEE SURGERY  left   left ear surgery     for bad cut   LEFT HEART CATHETERIZATION WITH CORONARY ANGIOGRAM N/A 02/22/2013   Procedure: LEFT HEART CATHETERIZATION WITH CORONARY ANGIOGRAM;  Surgeon: [name], MD;  Location: [facility];  Service: Cardiovascular;  Laterality: N/A;   REVERSE SHOULDER ARTHROPLASTY Right 04/07/2022   Procedure: RIGHT SHOULDER REVERSE SHOULDER ARTHROPLASTY;  Surgeon: Vanetta Mulders, MD;  Location: [facility];  Service: Orthopedics;  Laterality: Right;   SHOULDER SURGERY Bilateral    rotator cuff   TOE SURGERY Left    toe surgery   Patient Active Problem List   Diagnosis Date Noted   Rotator cuff  arthropathy of right shoulder    Carpal tunnel syndrome, right upper limb 01/03/2022   Chest pain 06/21/2021   Variant angina ([place]) 01/24/2017   Precordial chest pain 01/23/2017   Headache 01/23/2017   S/P angioplasty with stent 01/23/2017   Coronary artery disease 11/24/2014   Syncope 10/22/2014   Hyperglycemia 04/20/2014   H/O hiatal hernia 04/19/2014   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    Coronary artery vasospasm ([place]) 09/26/2013   HTN (hypertension) 12/08/2011   GERD (gastroesophageal reflux disease) 12/08/2011   Depression 12/08/2011   Dyslipidemia 12/08/2011     REFERRING PROVIDER: Vanetta Mulders MD  REFERRING DIAG: s/p Rt reverse TSA + biceps tenodesis, without subscap repair    THERAPY DIAG:  Acute pain of right  shoulder  Stiffness of right shoulder, not elsewhere classified  Muscle weakness (generalized)  Rationale for Evaluation and Treatment Rehabilitation  ONSET DATE: DOS 04/07/22  SUBJECTIVE:                                                                                                                                                                                      SUBJECTIVE STATEMENT: Pt is 9 weeks and 4 days s/p R reverse TSA and biceps tenodesis.  Pt states she can tell PT really stretched her out last time, but it did help.  She used tylenol and ice after prior Rx.  Pt states it feels stiff and sore this AM.  Pt reports it felt more loose after prior Rx though the stiffness returned.  Pt reports her shoulder hurts a when lowering her arm though not bad.  Pt states she thinks the weather has a lot to do with the pain and soreness.  Pt reports compliance with HEP. Pt denies any recent functional improvements.     PERTINENT HISTORY: H/o bil RCR R carpal tunnel release in July 2023  PAIN:  Are you having pain? Yes: NPRS scale: Current: 4/10, Worst:  6/10, Best:  2/10 Pain location: Rt anterior shoulder  Pain description: stinging Aggravating factors:   Relieving factors: pain meds  PRECAUTIONS: Shoulder, reverse TSA without subscap repair  WEIGHT BEARING RESTRICTIONS Yes NWB UE  FALLS:  Has patient fallen in last 6 months? No  LIVING ENVIRONMENT: Lives with: lives with their spouse No stairs  OCCUPATION: Not working  PLOF: Independent  PATIENT GOALS the use of my arm and hand back, gardening  OBJECTIVE:    TODAY'S TREATMENT:  Therapeutic Exercise: -Reviewed current function, pain level, response to prior Rx, and HEP compliance. -Pt received R shoulder PROM in flexion, scaption, and ER per protocol ranges w/n pt and tissue tolerance. -Pt performed:  Pulleys in flexion and scaption x  20 each  Supine flexion AROM x 10 reps  Supine flexion with head elevated 3 x  10 reps  Supine serratus punch 3x10  S/L shoulder abduction 2 x 10 reps   Scap retraction with YTB 3x10  S/L ER 3x10  Seated wand ER 3x10    PT reviewed HEP and updated HEP.  Pt received a HEP handout and was educated in correct form and appropriate frequency.  Pt instructed she should not have increased pain with HEP.    PATIENT EDUCATION: Education details: relevant anatomy, protocol expectations, POC, HEP, and exercise form/rationale. Person educated: Patient and Spouse Education method: Explanation, Demonstration, Tactile cues, Verbal cues, and Handouts Education comprehension: verbalized understanding, returned demonstration, verbal cues required, tactile cues required, and needs further education   HOME EXERCISE PROGRAM: [id number]   Updated HEP: - Sidelying Shoulder External Rotation  - 1 x daily - 5 x weekly - 2 sets - 10 reps Added supine flexion AROM with head elevated   ASSESSMENT:  CLINICAL IMPRESSION: Pt is improving with ROM t/o R shoulder including AROM.  She demonstrates improved ease with AROM exercises.  She is able to perform supine shoulder flexion AROM independently and had improved ease and form with supine serratus punch.  Pt tolerated PROM well and is improving with PROM.  She has tightness at end ranges.  Pt required cuing for correct form with exercises including keeping elbow straight with supine serratus punch and positioning with S/L ER.  Pt responded well to Rx reporting improved pain from 4/10 before Rx to "almost 0/10" after Rx.  Pt should benefit from cont skilled PT services per protocol to address ongoing goals and to assist in restoring desired level of function.     OBJECTIVE IMPAIRMENTS decreased activity tolerance, decreased knowledge of condition, decreased ROM, decreased strength, increased muscle spasms, impaired flexibility, impaired UE functional use, improper body mechanics, postural dysfunction, and pain.   ACTIVITY LIMITATIONS carrying,  lifting, sleeping, bed mobility, bathing, dressing, reach over head, and hygiene/grooming  PARTICIPATION LIMITATIONS: meal prep, cleaning, laundry, driving, shopping, and yard work  PERSONAL FACTORS Past/current experiences and see PMH  are also affecting patient's functional outcome.   REHAB POTENTIAL: Good  CLINICAL DECISION MAKING: Stable/uncomplicated  EVALUATION COMPLEXITY: Low   GOALS:  SHORT TERM GOALS: Target date: 05/09/2022   AROM flexion to 90 without incr pain and good scap control Baseline:see chart Goal status: met in supine, not assessed in standing  2.  ER in scapular plane to 30 deg Baseline: see chart Goal status: GOAL MET  3.  Pain levels <=3/10 Baseline: see subj reports Goal status:  ONGOING  4.  D/c sling Baseline: as tolerated at 3 wk Goal status: GOAL MET   LONG TERM GOALS: Target date: 05/23/2022    1.  Pt will meet FOTO goal Baseline: see above Goal status: ONGOING Target date: 06/20/22  2.  AROM flexion to 130 without incr pain and good scap control Baseline:  Goal status: ONGOING Target date: 05/23/2022   3.  Use of arm for ADLs pain <=3/10 Baseline:  Goal status: ONGOING Target date: 05/23/2022   4.  Initiate functional IR behind back without incr pain Baseline: can begin at 6 weeks per post op protocol Goal status: NOT MET Target date: 05/23/2022   5.  Pt will demo R shoulder AROM to be [place] t/o for performance of ADLs and IADLs.  Goal status: INITIAL Target date: 08/01/22  6.  Pt will be able to perform her ADLs  and IADLs including reaching activities without significant pain and difficulty.  Goal status: INITIAL Target date: 08/01/22  7.  Pt will be able to reach into an overhead cabinet without significant difficulty.  Goal status: INITIAL Target date: 08/01/22    PLAN: PT FREQUENCY: 2x/wk   PT DURATION: 9 weeks  PLANNED INTERVENTIONS: Therapeutic exercises, Therapeutic activity, Neuromuscular re-education,  Balance training, Gait training, Patient/Family education, Self Care, Joint mobilization, Aquatic Therapy, Dry Needling, Electrical stimulation, Spinal mobilization, Cryotherapy, Moist heat, scar mobilization, Taping, Vasopneumatic device, Manual therapy, and Re-evaluation  PLAN FOR NEXT SESSION: continue per Dr. Eddie Dibbles reverse TSA protocol.   Selinda Michaels III PT, DPT 06/13/22 3:56 PM

## 2022-06-16 ENCOUNTER — Other Ambulatory Visit: Payer: Self-pay | Admitting: Internal Medicine

## 2022-06-16 DIAGNOSIS — Z1231 Encounter for screening mammogram for malignant neoplasm of breast: Secondary | ICD-10-CM

## 2022-06-18 ENCOUNTER — Ambulatory Visit (HOSPITAL_BASED_OUTPATIENT_CLINIC_OR_DEPARTMENT_OTHER): Payer: Medicare Other | Admitting: Physical Therapy

## 2022-06-18 ENCOUNTER — Encounter (HOSPITAL_BASED_OUTPATIENT_CLINIC_OR_DEPARTMENT_OTHER): Payer: Self-pay | Admitting: Physical Therapy

## 2022-06-18 DIAGNOSIS — M6281 Muscle weakness (generalized): Secondary | ICD-10-CM | POA: Diagnosis not present

## 2022-06-18 DIAGNOSIS — M25611 Stiffness of right shoulder, not elsewhere classified: Secondary | ICD-10-CM

## 2022-06-18 DIAGNOSIS — M25511 Pain in right shoulder: Secondary | ICD-10-CM

## 2022-06-18 MED ORDER — DAPAGLIFLOZIN PROPANEDIOL 10 MG PO TABS: 10.0000 mg | ORAL_TABLET | Freq: Every day | ORAL | 11 refills | Status: DC

## 2022-06-18 NOTE — Telephone Encounter (Signed)
Received return call from [facility], informed her Rx for [name] has been faxed to manufacturer.  [name] expressed appreciation for follow-up.

## 2022-06-18 NOTE — Telephone Encounter (Signed)
Left message to callback.  Prescription for [facility] [place] faxed to manufacturer as requested.

## 2022-06-18 NOTE — Therapy (Signed)
OUTPATIENT PHYSICAL THERAPY SHOULDER TREATMENT   Patient Name: [name] MRN: [id number] DOB:05/22/1946, 76 y.o., female Today's Date: 06/19/2022    PT End of Session - 06/18/22        Visit Number 15    Number of Visits 28     Date for PT Re-Evaluation 08/01/22     Authorization Type UHC MCR     Progress Note Due on Visit 20     PT Start Time 1025     PT Stop Time 1105     PT Time Calculation (min) 44 min     Activity Tolerance Patient tolerated treatment well     Behavior During Therapy WFL for tasks assessed/performed      Past Medical History:  Diagnosis Date   Anemia    Anginal pain ([place])    last cp was last year   Anxiety    Arthritis    Blood transfusion    Chest pain 02/04/2016   Coronary artery disease    Coronary artery spasm, wth continued episodes of chest pain.  09/26/2013   Coronary vasospasm (HCC)    Diabetes mellitus without complication (HCC)    type II   GERD (gastroesophageal reflux disease)    Headache    Hiatal hernia    Hypercholesteremia    Hypertension    NSTEMI (non-ST elevated myocardial infarction) ([place]) not sure   NSVT (nonsustained ventricular tachycardia) ([place]) 09/26/2013   Panic attack    Past Surgical History:  Procedure Laterality Date   ABDOMINAL HYSTERECTOMY     PARTIAL HYSTERECTOMY   ACDF N/A    C5-6 ACDF Dr. Sherwood Gambler   breast     right, tumor benign   BREAST EXCISIONAL BIOPSY Right    1961 (age 10) benign   CARDIAC CATHETERIZATION  2014   CARDIAC CATHETERIZATION  2012   CARDIAC CATHETERIZATION N/A 07/22/2016   Procedure: Left Heart Cath and Coronary Angiography;  Surgeon: Belva Crome, MD;  Location: [place] [facility];  Service: Cardiovascular;  Laterality: N/A;   cardiac stent  2012   to RCA   CHOLECYSTECTOMY N/A 10/02/2017   Procedure: LAPAROSCOPIC CHOLECYSTECTOMY;  Surgeon: Ralene Ok, MD;  Location: [facility];  Service: General;  Laterality: N/A;   CORONARY ANGIOPLASTY     ESOPHAGEAL MANOMETRY N/A  10/29/2016   Procedure: ESOPHAGEAL MANOMETRY (EM);  Surgeon: Garlan Fair, MD;  [facility];  Service: Endoscopy;  Laterality: N/A;   ESOPHAGOGASTRODUODENOSCOPY (EGD) WITH PROPOFOL N/A 10/28/2016   Procedure: ESOPHAGOGASTRODUODENOSCOPY (EGD) WITH PROPOFOL;  Surgeon: Garlan Fair, MD;  [facility]: [facility];  Service: Endoscopy;  Laterality: N/A;   EXCISION OF SKIN TAG N/A 07/29/2017   Procedure: EXCISION OF PERIANAL  SKIN TAG;  Surgeon: Ralene Ok, MD;  Location: [place];  Service: General;  Laterality: N/A;   KNEE SURGERY  left   left ear surgery     for bad cut   LEFT HEART CATHETERIZATION WITH CORONARY ANGIOGRAM N/A 02/22/2013   Procedure: LEFT HEART CATHETERIZATION WITH CORONARY ANGIOGRAM;  Surgeon: [name], MD;  Location: [facility] C[facility];  Service: Cardiovascular;  Laterality: N/A;   REVERSE SHOULDER ARTHROPLASTY Right 04/07/2022   Procedure: RIGHT SHOULDER REVERSE SHOULDER ARTHROPLASTY;  Surgeon: Vanetta Mulders, MD;  Location: [facility];  Service: Orthopedics;  Laterality: Right;   SHOULDER SURGERY Bilateral    rotator cuff   TOE SURGERY Left    toe surgery   Patient Active Problem List   Diagnosis Date Noted   Rotator  cuff arthropathy of right shoulder    Carpal tunnel syndrome, right upper limb 01/03/2022   Chest pain 06/21/2021   Variant angina ([place]) 01/24/2017   Precordial chest pain 01/23/2017   Headache 01/23/2017   S/P angioplasty with stent 01/23/2017   Coronary artery disease 11/24/2014   Syncope 10/22/2014   Hyperglycemia 04/20/2014   H/O hiatal hernia 04/19/2014   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    Coronary artery vasospasm ([place]) 09/26/2013   HTN (hypertension) 12/08/2011   GERD (gastroesophageal reflux disease) 12/08/2011   Depression 12/08/2011   Dyslipidemia 12/08/2011     REFERRING PROVIDER: Vanetta Mulders MD  REFERRING DIAG: s/p Rt reverse TSA + biceps tenodesis, without subscap repair    THERAPY DIAG:  Acute pain of right  shoulder  Stiffness of right shoulder, not elsewhere classified  Muscle weakness (generalized)  Rationale for Evaluation and Treatment Rehabilitation  ONSET DATE: DOS 04/07/22  SUBJECTIVE:                                                                                                                                                                                      SUBJECTIVE STATEMENT: Pt is 10 weeks and 2 days s/p R reverse TSA and biceps tenodesis.  Pt states her shoulder is feeling better and she notices a lot of improvement.  Pt states it hurts mainly at night and she has difficulty sleeping.  Pt reports her shoulder will feel better but then tighten up.    PERTINENT HISTORY: H/o bil RCR R carpal tunnel release in July 2023  PAIN:  Are you having pain? Yes: NPRS scale: Current: 1/10, Worst:  6/10, Best:  2/10 Pain location: Rt anterior shoulder  Pain description: stinging Aggravating factors:   Relieving factors: pain meds  PRECAUTIONS: Shoulder, reverse TSA without subscap repair  WEIGHT BEARING RESTRICTIONS Yes NWB UE  FALLS:  Has patient fallen in last 6 months? No  LIVING ENVIRONMENT: Lives with: lives with their spouse No stairs  OCCUPATION: Not working  PLOF: Independent  PATIENT GOALS the use of my arm and hand back, gardening  OBJECTIVE:    TODAY'S TREATMENT:  Therapeutic Exercise: -Reviewed current function, pain level, response to prior Rx, and HEP compliance. -Pt received R shoulder PROM in flexion, scaption, and ER per protocol ranges w/n pt and tissue tolerance. -Pt performed:  Pulleys in flexion and scaption x 20-25 each  Supine flexion AROM x 10 reps  Supine flexion with head elevated 2 x 10 reps  S/L shoulder abduction 2 x 10 reps   Scap retraction with YTB 3x10  S/L ER 3x10  Standing wand flexion in front of mirror with cuing to decrease shoulder hike 2  x 10 reps  Attempted shoulder flexion AAROM wall walks though pt unable to  perform     PATIENT EDUCATION: Education details: relevant anatomy, protocol expectations, POC, HEP, and exercise form/rationale. Person educated: Patient and Spouse Education method: Explanation, Demonstration, Tactile cues, Verbal cues, and Handouts Education comprehension: verbalized understanding, returned demonstration, verbal cues required, tactile cues required, and needs further education   HOME EXERCISE PROGRAM: [email or web address]     ASSESSMENT:  CLINICAL IMPRESSION: Pt's shoulder is feeling better and she presents to Rx with reduced pain.  Pt tolerated PROM well and is improving with PROM.  She has tightness at end ranges.  Pt able to perform supine flexion AROM with head elevated well with improved ease.  Pt continues to have difficulty and limitations with standing AAROM elevation.  Pt unable to perform standing wall walks in flexion AAROM.  She was able to perform standing wand flexion though was limited.  Pt required cuing for correct form and performed in front of mirror for visual cuing.  Pt responded well to Rx having no c/o's after Rx.  Pt should benefit from cont skilled PT services per protocol to address ongoing goals and to assist in restoring desired level of function.     OBJECTIVE IMPAIRMENTS decreased activity tolerance, decreased knowledge of condition, decreased ROM, decreased strength, increased muscle spasms, impaired flexibility, impaired UE functional use, improper body mechanics, postural dysfunction, and pain.   ACTIVITY LIMITATIONS carrying, lifting, sleeping, bed mobility, bathing, dressing, reach over head, and hygiene/grooming  PARTICIPATION LIMITATIONS: meal prep, cleaning, laundry, driving, shopping, and yard work  PERSONAL FACTORS Past/current experiences and see PMH  are also affecting patient's functional outcome.   REHAB POTENTIAL: Good  CLINICAL DECISION MAKING: Stable/uncomplicated  EVALUATION COMPLEXITY: Low   GOALS:  SHORT TERM GOALS:  Target date: 05/09/2022   AROM flexion to 90 without incr pain and good scap control Baseline:see chart Goal status: met in supine, not assessed in standing  2.  ER in scapular plane to 30 deg Baseline: see chart Goal status: GOAL MET  3.  Pain levels <=3/10 Baseline: see subj reports Goal status:  ONGOING  4.  D/c sling Baseline: as tolerated at 3 wk Goal status: GOAL MET   LONG TERM GOALS: Target date: 05/23/2022    1.  Pt will meet FOTO goal Baseline: see above Goal status: ONGOING Target date: 06/20/22  2.  AROM flexion to 130 without incr pain and good scap control Baseline:  Goal status: ONGOING Target date: 05/23/2022   3.  Use of arm for ADLs pain <=3/10 Baseline:  Goal status: ONGOING Target date: 05/23/2022   4.  Initiate functional IR behind back without incr pain Baseline: can begin at 6 weeks per post op protocol Goal status: NOT MET Target date: 05/23/2022   5.  Pt will demo R shoulder AROM to be [facility] t/o for performance of ADLs and IADLs.  Goal status: INITIAL Target date: 08/01/22  6.  Pt will be able to perform her ADLs and IADLs including reaching activities without significant pain and difficulty.  Goal status: INITIAL Target date: 08/01/22  7.  Pt will be able to reach into an overhead cabinet without significant difficulty.  Goal status: INITIAL Target date: 08/01/22    PLAN: PT FREQUENCY: 2x/wk   PT DURATION: 9 weeks  PLANNED INTERVENTIONS: Therapeutic exercises, Therapeutic activity, Neuromuscular re-education, Balance training, Gait training, Patient/Family education, Self Care, Joint mobilization, Aquatic Therapy, Dry Needling, Electrical stimulation, Spinal mobilization, Cryotherapy, Moist heat, scar mobilization,  Taping, Vasopneumatic device, Manual therapy, and Re-evaluation  PLAN FOR NEXT SESSION: continue per Dr. Eddie Dibbles reverse TSA protocol.   Selinda Michaels III PT, DPT 06/19/22 8:56 PM

## 2022-06-20 ENCOUNTER — Encounter (HOSPITAL_BASED_OUTPATIENT_CLINIC_OR_DEPARTMENT_OTHER): Payer: Self-pay | Admitting: Physical Therapy

## 2022-06-20 ENCOUNTER — Ambulatory Visit (HOSPITAL_BASED_OUTPATIENT_CLINIC_OR_DEPARTMENT_OTHER): Payer: Medicare Other | Admitting: Physical Therapy

## 2022-06-20 DIAGNOSIS — M25611 Stiffness of right shoulder, not elsewhere classified: Secondary | ICD-10-CM | POA: Diagnosis not present

## 2022-06-20 DIAGNOSIS — M6281 Muscle weakness (generalized): Secondary | ICD-10-CM | POA: Diagnosis not present

## 2022-06-20 DIAGNOSIS — M25511 Pain in right shoulder: Secondary | ICD-10-CM

## 2022-06-20 NOTE — Therapy (Signed)
OUTPATIENT PHYSICAL THERAPY SHOULDER TREATMENT   Patient Name: [name] MRN: [id number] DOB:July 25, 1946, 76 y.o., female Today's Date: 06/20/2022   PT End of Session - 06/20/22 1022     Visit Number 16    Number of Visits 28    Date for PT Re-Evaluation 08/01/22    Authorization Type UHC MCR    PT Start Time 1021    PT Stop Time 1101    PT Time Calculation (min) 40 min    Activity Tolerance Patient tolerated treatment well    Behavior During Therapy WFL for tasks assessed/performed                 Past Medical History:  Diagnosis Date   Anemia    Anginal pain ([place])    last cp was last year   Anxiety    Arthritis    Blood transfusion    Chest pain 02/04/2016   Coronary artery disease    Coronary artery spasm, wth continued episodes of chest pain.  09/26/2013   Coronary vasospasm (H[facility])    Diabetes mellitus without complication (HCC)    type II   GERD (gastroesophageal reflux disease)    Headache    Hiatal hernia    Hypercholesteremia    Hypertension    NSTEMI (non-ST elevated myocardial infarction) ([place]) not sure   NSVT (nonsustained ventricular tachycardia) ([place]) 09/26/2013   Panic attack    Past Surgical History:  Procedure Laterality Date   ABDOMINAL HYSTERECTOMY     PARTIAL HYSTERECTOMY   ACDF N/A    C5-6 ACDF Dr. Sherwood Gambler   breast     right, tumor benign   BREAST EXCISIONAL BIOPSY Right    1961 (age 54) benign   CARDIAC CATHETERIZATION  2014   CARDIAC CATHETERIZATION  2012   CARDIAC CATHETERIZATION N/A 07/22/2016   Procedure: Left Heart Cath and Coronary Angiography;  Surgeon: Belva Crome, MD;  Location: [place] [facility];  Service: Cardiovascular;  Laterality: N/A;   cardiac stent  2012   to RCA   CHOLECYSTECTOMY N/A 10/02/2017   Procedure: LAPAROSCOPIC CHOLECYSTECTOMY;  Surgeon: Ralene Ok, MD;  Location: [place];  Service: General;  Laterality: N/A;   CORONARY ANGIOPLASTY     ESOPHAGEAL MANOMETRY N/A 10/29/2016   Procedure:  ESOPHAGEAL MANOMETRY (EM);  Surgeon: Garlan Fair, MD;  [facility]: [facility];  Service: Endoscopy;  Laterality: N/A;   ESOPHAGOGASTRODUODENOSCOPY (EGD) WITH PROPOFOL N/A 10/28/2016   Procedure: ESOPHAGOGASTRODUODENOSCOPY (EGD) WITH PROPOFOL;  Surgeon: Garlan Fair, MD;  Location: [facility];  Service: Endoscopy;  Laterality: N/A;   EXCISION OF SKIN TAG N/A 07/29/2017   Procedure: EXCISION OF PERIANAL  SKIN TAG;  Surgeon: Ralene Ok, MD;  Location: [place];  Service: General;  Laterality: N/A;   KNEE SURGERY  left   left ear surgery     for bad cut   LEFT HEART CATHETERIZATION WITH CORONARY ANGIOGRAM N/A 02/22/2013   Procedure: LEFT HEART CATHETERIZATION WITH CORONARY ANGIOGRAM;  Surgeon: [name], MD;  Location: [facility];  Service: Cardiovascular;  Laterality: N/A;   REVERSE SHOULDER ARTHROPLASTY Right 04/07/2022   Procedure: RIGHT SHOULDER REVERSE SHOULDER ARTHROPLASTY;  Surgeon: Vanetta Mulders, MD;  Location: [place];  Service: Orthopedics;  Laterality: Right;   SHOULDER SURGERY Bilateral    rotator cuff   TOE SURGERY Left    toe surgery   Patient Active Problem List   Diagnosis Date Noted   Rotator cuff arthropathy of right shoulder    Carpal  tunnel syndrome, right upper limb 01/03/2022   Chest pain 06/21/2021   Variant angina ([place]) 01/24/2017   Precordial chest pain 01/23/2017   Headache 01/23/2017   S/P angioplasty with stent 01/23/2017   Coronary artery disease 11/24/2014   Syncope 10/22/2014   Hyperglycemia 04/20/2014   H/O hiatal hernia 04/19/2014   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    Coronary artery vasospasm ([place]) 09/26/2013   HTN (hypertension) 12/08/2011   GERD (gastroesophageal reflux disease) 12/08/2011   Depression 12/08/2011   Dyslipidemia 12/08/2011     REFERRING PROVIDER: Vanetta Mulders MD  REFERRING DIAG: s/p Rt reverse TSA + biceps tenodesis, without subscap repair    THERAPY DIAG:  Acute pain of right shoulder  Stiffness of right shoulder, not  elsewhere classified  Muscle weakness (generalized)  Rationale for Evaluation and Treatment Rehabilitation  ONSET DATE: DOS 04/07/22  SUBJECTIVE:                                                                                                                                                                                      SUBJECTIVE STATEMENT: Pt is 10 weeks and 4 days s/p R reverse TSA and biceps tenodesis.  Pt reports stiffness in shoulder.  Pt states her shoulder is feeling better and she notices a lot of improvement.  She has improved UE elevation.  Pt states it hurts mainly at night and she has difficulty sleeping.  Pt reports compliance with HEP.  She states she was able to perform supine flexion AROM with head elevated at home.  Pt has stiffness in fingers and hand 1st thing in AM.  She is still gripping putty.    PERTINENT HISTORY: H/o bil RCR R carpal tunnel release in July 2023  PAIN:  Are you having pain? Yes: NPRS scale: Current: 2/10, Worst:  6/10, Best:  2/10 Pain location: Rt anterior shoulder  Pain description: stinging Aggravating factors:   Relieving factors: pain meds  PRECAUTIONS: Shoulder, reverse TSA without subscap repair  WEIGHT BEARING RESTRICTIONS Yes NWB UE  FALLS:  Has patient fallen in last 6 months? No  LIVING ENVIRONMENT: Lives with: lives with their spouse No stairs  OCCUPATION: Not working  PLOF: Independent  PATIENT GOALS the use of my arm and hand back, gardening  OBJECTIVE:    TODAY'S TREATMENT:  Therapeutic Exercise: -Reviewed current function, pain level, response to prior Rx, and HEP compliance. -Pt received R shoulder PROM in flexion, scaption, abd and ER per protocol ranges w/n pt and tissue tolerance. -Pt performed:  Pulleys in flexion and scaption 2 x 20 each  Supine flexion with head elevated 3 x 10 reps  S/L shoulder abduction 2 x 10 reps  Scap retraction with YTB 3x10  S/L ER 3x10  Standing wand flexion in front  of mirror with cuing to decrease shoulder hike 2 x 10 reps  Attempted shoulder wand scaption though pt unable to perform well  Prone extension to neutral 3x10     PATIENT EDUCATION: Education details: relevant anatomy, protocol expectations, POC, HEP, and exercise form/rationale. Person educated: Patient and Spouse Education method: Explanation, Demonstration, Tactile cues, Verbal cues, and Handouts Education comprehension: verbalized understanding, returned demonstration, verbal cues required, tactile cues required, and needs further education   HOME EXERCISE PROGRAM: [email or web address]     ASSESSMENT:  CLINICAL IMPRESSION: Pt states her shoulder is feeling better and she has improved reaching.  She continues to report having stiffness in shoulder and hand.  Pt had tightness and limitations with R shoulder PROM which improved with increased reps of PROM.  Pt is improving with ease and ROM with supine flexion AROM with head elevated.  Pt continues to have difficulty and limitations with standing AAROM elevation.  Pt performed standing wand flexion in front of mirror for visual cuing.  PT provided verbal and tactile cuing to depress scapula to decrease compensatory shoulder hike.  Attempted standing wand scaption though pt was unable to perform well and had pain.  Pt is improving with ROM overall.  Pt responded well to Rx having no increased pain after Rx.  Pt should benefit from cont skilled PT services per protocol to address ongoing goals and to assist in restoring desired level of function.     OBJECTIVE IMPAIRMENTS decreased activity tolerance, decreased knowledge of condition, decreased ROM, decreased strength, increased muscle spasms, impaired flexibility, impaired UE functional use, improper body mechanics, postural dysfunction, and pain.   ACTIVITY LIMITATIONS carrying, lifting, sleeping, bed mobility, bathing, dressing, reach over head, and hygiene/grooming  PARTICIPATION LIMITATIONS: meal  prep, cleaning, laundry, driving, shopping, and yard work  PERSONAL FACTORS Past/current experiences and see PMH  are also affecting patient's functional outcome.   REHAB POTENTIAL: Good  CLINICAL DECISION MAKING: Stable/uncomplicated  EVALUATION COMPLEXITY: Low   GOALS:  SHORT TERM GOALS: Target date: 05/09/2022   AROM flexion to 90 without incr pain and good scap control Baseline:see chart Goal status: met in supine, not assessed in standing  2.  ER in scapular plane to 30 deg Baseline: see chart Goal status: GOAL MET  3.  Pain levels <=3/10 Baseline: see subj reports Goal status:  ONGOING  4.  D/c sling Baseline: as tolerated at 3 wk Goal status: GOAL MET   LONG TERM GOALS: Target date: 05/23/2022    1.  Pt will meet FOTO goal Baseline: see above Goal status: ONGOING Target date: 06/20/22  2.  AROM flexion to 130 without incr pain and good scap control Baseline:  Goal status: ONGOING Target date: 05/23/2022   3.  Use of arm for ADLs pain <=3/10 Baseline:  Goal status: ONGOING Target date: 05/23/2022   4.  Initiate functional IR behind back without incr pain Baseline: can begin at 6 weeks per post op protocol Goal status: NOT MET Target date: 05/23/2022   5.  Pt will demo R shoulder AROM to be [facility] t/o for performance of ADLs and IADLs.  Goal status: INITIAL Target date: 08/01/22  6.  Pt will be able to perform her ADLs and IADLs including reaching activities without significant pain and difficulty.  Goal status: INITIAL Target date: 08/01/22  7.  Pt will be able to reach into an overhead cabinet without significant difficulty.  Goal status: INITIAL Target date: 08/01/22    PLAN: PT FREQUENCY: 2x/wk   PT DURATION: 9 weeks  PLANNED INTERVENTIONS: Therapeutic exercises, Therapeutic activity, Neuromuscular re-education, Balance training, Gait training, Patient/Family education, Self Care, Joint mobilization, Aquatic Therapy, Dry Needling,  Electrical stimulation, Spinal mobilization, Cryotherapy, Moist heat, scar mobilization, Taping, Vasopneumatic device, Manual therapy, and Re-evaluation  PLAN FOR NEXT SESSION: continue per Dr. Eddie Dibbles reverse TSA protocol.   Selinda Michaels III PT, DPT 06/20/22 3:49 PM

## 2022-06-22 NOTE — Therapy (Signed)
OUTPATIENT PHYSICAL THERAPY SHOULDER TREATMENT   Patient Name: [name] MRN: [id number] DOB:1946-05-18, 76 y.o., female Today's Date: 06/23/2022   PT End of Session - 06/23/22 1519     Visit Number 17    Number of Visits 28    Date for PT Re-Evaluation 08/01/22    Authorization Type UHC MCR    Progress Note Due on Visit 20    PT Start Time 1515    PT Stop Time 1600    PT Time Calculation (min) 45 min    Activity Tolerance Patient tolerated treatment well    Behavior During Therapy WFL for tasks assessed/performed                  Past Medical History:  Diagnosis Date   Anemia    Anginal pain ([place])    last cp was last year   Anxiety    Arthritis    Blood transfusion    Chest pain 02/04/2016   Coronary artery disease    Coronary artery spasm, wth continued episodes of chest pain.  09/26/2013   Coronary vasospasm (HCC)    Diabetes mellitus without complication (HCC)    type II   GERD (gastroesophageal reflux disease)    Headache    Hiatal hernia    Hypercholesteremia    Hypertension    NSTEMI (non-ST elevated myocardial infarction) ([place]) not sure   NSVT (nonsustained ventricular tachycardia) ([place]) 09/26/2013   Panic attack    Past Surgical History:  Procedure Laterality Date   ABDOMINAL HYSTERECTOMY     PARTIAL HYSTERECTOMY   ACDF N/A    C5-6 ACDF Dr. Sherwood Gambler   breast     right, tumor benign   BREAST EXCISIONAL BIOPSY Right    1961 (age 67) benign   CARDIAC CATHETERIZATION  2014   CARDIAC CATHETERIZATION  2012   CARDIAC CATHETERIZATION N/A 07/22/2016   Procedure: Left Heart Cath and Coronary Angiography;  Surgeon: Belva Crome, MD;  Location: [place] [facility];  Service: Cardiovascular;  Laterality: N/A;   cardiac stent  2012   to RCA   CHOLECYSTECTOMY N/A 10/02/2017   Procedure: LAPAROSCOPIC CHOLECYSTECTOMY;  Surgeon: Ralene Ok, MD;  Location: [place];  Service: General;  Laterality: N/A;   CORONARY ANGIOPLASTY     ESOPHAGEAL  MANOMETRY N/A 10/29/2016   Procedure: ESOPHAGEAL MANOMETRY (EM);  Surgeon: Garlan Fair, MD;  [facility];  Service: Endoscopy;  Laterality: N/A;   ESOPHAGOGASTRODUODENOSCOPY (EGD) WITH PROPOFOL N/A 10/28/2016   Procedure: ESOPHAGOGASTRODUODENOSCOPY (EGD) WITH PROPOFOL;  Surgeon: Garlan Fair, MD;  [facility];  Service: Endoscopy;  Laterality: N/A;   EXCISION OF SKIN TAG N/A 07/29/2017   Procedure: EXCISION OF PERIANAL  SKIN TAG;  Surgeon: Ralene Ok, MD;  Location: [place];  Service: General;  Laterality: N/A;   KNEE SURGERY  left   left ear surgery     for bad cut   LEFT HEART CATHETERIZATION WITH CORONARY ANGIOGRAM N/A 02/22/2013   Procedure: LEFT HEART CATHETERIZATION WITH CORONARY ANGIOGRAM;  Surgeon: [name], MD;  Location: [facility];  Service: Cardiovascular;  Laterality: N/A;   REVERSE SHOULDER ARTHROPLASTY Right 04/07/2022   Procedure: RIGHT SHOULDER REVERSE SHOULDER ARTHROPLASTY;  Surgeon: Vanetta Mulders, MD;  Location: [place];  Service: Orthopedics;  Laterality: Right;   SHOULDER SURGERY Bilateral    rotator cuff   TOE SURGERY Left    toe surgery   Patient Active Problem List   Diagnosis Date Noted  Rotator cuff arthropathy of right shoulder    Carpal tunnel syndrome, right upper limb 01/03/2022   Chest pain 06/21/2021   Variant angina ([place]) 01/24/2017   Precordial chest pain 01/23/2017   Headache 01/23/2017   S/P angioplasty with stent 01/23/2017   Coronary artery disease 11/24/2014   Syncope 10/22/2014   Hyperglycemia 04/20/2014   H/O hiatal hernia 04/19/2014   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    Coronary artery vasospasm ([place]) 09/26/2013   HTN (hypertension) 12/08/2011   GERD (gastroesophageal reflux disease) 12/08/2011   Depression 12/08/2011   Dyslipidemia 12/08/2011     REFERRING PROVIDER: Vanetta Mulders MD  REFERRING DIAG: s/p Rt reverse TSA + biceps tenodesis, without subscap repair    THERAPY DIAG:  Acute pain of right  shoulder  Stiffness of right shoulder, not elsewhere classified  Muscle weakness (generalized)  Rationale for Evaluation and Treatment Rehabilitation  ONSET DATE: DOS 04/07/22  SUBJECTIVE:                                                                                                                                                                                      SUBJECTIVE STATEMENT: Pt is 11 weeks s/p R reverse TSA and biceps tenodesis.  Pt reports stiffness in shoulder.  Pt states her shoulder is feeling better and she notices a lot of improvement.  She has improved UE elevation.  Pt states it hurts mainly at night and she has difficulty sleeping.  Pt reports compliance with HEP.  She states she was able to perform supine flexion AROM with head elevated at home.  Pt has stiffness in fingers and hand 1st thing in AM.  She is still gripping putty.   Pt states she is able to use her R arm more, but still has certain things she can't do.  Pt reports improved dressing though is still limited.  Pt states she is able to sweep a little bit.  Pt is able to perform laundry.  Pt unable to lift R UE to apply deodorant.  Pt reports compliance with HEP.  Pt states she felt ok after prior Rx, just a little sore.     PERTINENT HISTORY: H/o bil RCR R carpal tunnel release in July 2023  PAIN:  Are you having pain? Yes: NPRS scale: Current: 1/10, Worst:  6/10, Best:  2/10 Pain location: Rt anterior shoulder  Pain description: stinging Aggravating factors:   Relieving factors: pain meds  PRECAUTIONS: Shoulder, reverse TSA without subscap repair  WEIGHT BEARING RESTRICTIONS Yes NWB UE  FALLS:  Has patient fallen in last 6 months? No  LIVING ENVIRONMENT: Lives with: lives with their spouse No stairs  OCCUPATION: Not working  PLOF: Independent  PATIENT GOALS the use of my arm and hand back, gardening  OBJECTIVE:    TODAY'S TREATMENT:  Therapeutic Exercise: -Reviewed current  function, pain level, response to prior Rx, and HEP compliance. -Pt received R shoulder PROM in flexion, scaption, abd and ER per protocol ranges w/n pt and tissue tolerance. -Assessed Shoulder ROM:    R shoulder PROM/AROM:  Flexion:  143/135 in supine  Abd:  102  ER:  50/43     Standing wand flexion:  87 deg with compensatory shoulder hike.   Pt performed:  Pulleys in flexion,scaption, and abduction x 20-25 each  Supine flexion with head elevated  x 10 reps  Sitting on incline bench shoulder flexion 2x10 reps with PT assist (setting:  5)  S/L shoulder abduction x 10 reps and with head elevated 2x10 reps   Scap retraction with YTB 3x10  S/L ER 2x10  Standing wand flexion in front of mirror with cuing to decrease shoulder hike 2 x 10 reps  Standing functional IR AROM 10-12 reps       PATIENT EDUCATION: Education details: relevant anatomy, protocol expectations, POC, HEP, and exercise form/rationale. Person educated: Patient and Spouse Education method: Explanation, Demonstration, Tactile cues, Verbal cues, and Handouts Education comprehension: verbalized understanding, returned demonstration, verbal cues required, tactile cues required, and needs further education   HOME EXERCISE PROGRAM: [email or web address]     ASSESSMENT:  CLINICAL IMPRESSION: Pt reports improved usage of R UE though is still limited with performing self care activities and ADLs/IADLs.  Pt demonstrates improved R shoulder ROM as evidenced by goniometric measurements.  She continues to be limited with UE elevation against gravity.  She is able to perform supine flexion AROM and S/L abduction well with head elevated.  PT had pt perform shoulder flexion seated on incline bench and pt had increased difficulty with increased shoulder compensatory hike.  PT assisted Pt to improve ease and to reduce compensatory shoulder hike.  She continues to have shoulder hike with standing flexion AAROM though did demo improved quality and ROM  today with wand with cuing in front of mirror.  Pt responded well to Rx having no c/o's and no increased pain after Rx.  Pt should benefit from cont skilled PT services per protocol to address ongoing goals and to assist in restoring desired level of function.     OBJECTIVE IMPAIRMENTS decreased activity tolerance, decreased knowledge of condition, decreased ROM, decreased strength, increased muscle spasms, impaired flexibility, impaired UE functional use, improper body mechanics, postural dysfunction, and pain.   ACTIVITY LIMITATIONS carrying, lifting, sleeping, bed mobility, bathing, dressing, reach over head, and hygiene/grooming  PARTICIPATION LIMITATIONS: meal prep, cleaning, laundry, driving, shopping, and yard work  PERSONAL FACTORS Past/current experiences and see PMH  are also affecting patient's functional outcome.   REHAB POTENTIAL: Good  CLINICAL DECISION MAKING: Stable/uncomplicated  EVALUATION COMPLEXITY: Low   GOALS:  SHORT TERM GOALS: Target date: 05/09/2022   AROM flexion to 90 without incr pain and good scap control Baseline:see chart Goal status: met in supine, not assessed in standing  2.  ER in scapular plane to 30 deg Baseline: see chart Goal status: GOAL MET  3.  Pain levels <=3/10 Baseline: see subj reports Goal status:  ONGOING  4.  D/c sling Baseline: as tolerated at 3 wk Goal status: GOAL MET   LONG TERM GOALS: Target date: 05/23/2022    1.  Pt will meet FOTO goal Baseline: see above Goal status: ONGOING Target date: 06/20/22  2.  AROM flexion to 130 without incr pain and good scap control Baseline:  Goal status: ONGOING Target date: 05/23/2022   3.  Use of arm for ADLs pain <=3/10 Baseline:  Goal status: ONGOING Target date: 05/23/2022   4.  Initiate functional IR behind back without incr pain Baseline: can begin at 6 weeks per post op protocol Goal status: NOT MET Target date: 05/23/2022   5.  Pt will demo R shoulder AROM to be  [facility] t/o for performance of ADLs and IADLs.  Goal status: INITIAL Target date: 08/01/22  6.  Pt will be able to perform her ADLs and IADLs including reaching activities without significant pain and difficulty.  Goal status: INITIAL Target date: 08/01/22  7.  Pt will be able to reach into an overhead cabinet without significant difficulty.  Goal status: INITIAL Target date: 08/01/22    PLAN: PT FREQUENCY: 2x/wk   PT DURATION: 9 weeks  PLANNED INTERVENTIONS: Therapeutic exercises, Therapeutic activity, Neuromuscular re-education, Balance training, Gait training, Patient/Family education, Self Care, Joint mobilization, Aquatic Therapy, Dry Needling, Electrical stimulation, Spinal mobilization, Cryotherapy, Moist heat, scar mobilization, Taping, Vasopneumatic device, Manual therapy, and Re-evaluation  PLAN FOR NEXT SESSION: continue per Dr. Eddie Dibbles reverse TSA protocol.   Selinda Michaels III PT, DPT 06/23/22 4:37 PM

## 2022-06-23 ENCOUNTER — Encounter (HOSPITAL_BASED_OUTPATIENT_CLINIC_OR_DEPARTMENT_OTHER): Payer: Self-pay | Admitting: Physical Therapy

## 2022-06-23 ENCOUNTER — Ambulatory Visit (HOSPITAL_BASED_OUTPATIENT_CLINIC_OR_DEPARTMENT_OTHER): Payer: Medicare Other | Admitting: Physical Therapy

## 2022-06-23 DIAGNOSIS — M6281 Muscle weakness (generalized): Secondary | ICD-10-CM | POA: Diagnosis not present

## 2022-06-23 DIAGNOSIS — M25611 Stiffness of right shoulder, not elsewhere classified: Secondary | ICD-10-CM | POA: Diagnosis not present

## 2022-06-23 DIAGNOSIS — M25511 Pain in right shoulder: Secondary | ICD-10-CM | POA: Diagnosis not present

## 2022-06-24 DIAGNOSIS — I2510 Atherosclerotic heart disease of native coronary artery without angina pectoris: Secondary | ICD-10-CM | POA: Diagnosis not present

## 2022-06-24 DIAGNOSIS — G4700 Insomnia, unspecified: Secondary | ICD-10-CM | POA: Diagnosis not present

## 2022-06-24 DIAGNOSIS — K219 Gastro-esophageal reflux disease without esophagitis: Secondary | ICD-10-CM | POA: Diagnosis not present

## 2022-06-24 DIAGNOSIS — E782 Mixed hyperlipidemia: Secondary | ICD-10-CM | POA: Diagnosis not present

## 2022-06-24 DIAGNOSIS — I10 Essential (primary) hypertension: Secondary | ICD-10-CM | POA: Diagnosis not present

## 2022-06-24 DIAGNOSIS — E1169 Type 2 diabetes mellitus with other specified complication: Secondary | ICD-10-CM | POA: Diagnosis not present

## 2022-06-25 ENCOUNTER — Encounter (HOSPITAL_BASED_OUTPATIENT_CLINIC_OR_DEPARTMENT_OTHER): Payer: Self-pay | Admitting: Physical Therapy

## 2022-06-25 ENCOUNTER — Ambulatory Visit (HOSPITAL_BASED_OUTPATIENT_CLINIC_OR_DEPARTMENT_OTHER): Payer: Medicare Other | Admitting: Physical Therapy

## 2022-06-25 DIAGNOSIS — M25611 Stiffness of right shoulder, not elsewhere classified: Secondary | ICD-10-CM

## 2022-06-25 DIAGNOSIS — M6281 Muscle weakness (generalized): Secondary | ICD-10-CM

## 2022-06-25 DIAGNOSIS — M25511 Pain in right shoulder: Secondary | ICD-10-CM

## 2022-06-25 NOTE — Therapy (Signed)
OUTPATIENT PHYSICAL THERAPY SHOULDER TREATMENT   Patient Name: [name] MRN: [id number] DOB:04/09/1946, 76 y.o., female Today's Date: 06/26/2022   PT End of Session - 06/25/22 1519     Visit Number 18    Number of Visits 28    Date for PT Re-Evaluation 08/01/22    Authorization Type UHC MCR    Progress Note Due on Visit 20    PT Start Time 4037    PT Stop Time 1557    PT Time Calculation (min) 40 min    Activity Tolerance Patient tolerated treatment well    Behavior During Therapy WFL for tasks assessed/performed                  Past Medical History:  Diagnosis Date   Anemia    Anginal pain ([place])    last cp was last year   Anxiety    Arthritis    Blood transfusion    Chest pain 02/04/2016   Coronary artery disease    Coronary artery spasm, wth continued episodes of chest pain.  09/26/2013   Coronary vasospasm ([facility])    Diabetes mellitus without complication ([facility])    type II   GERD (gastroesophageal reflux disease)    Headache    Hiatal hernia    Hypercholesteremia    Hypertension    NSTEMI (non-ST elevated myocardial infarction) ([place]) not sure   NSVT (nonsustained ventricular tachycardia) ([place]) 09/26/2013   Panic attack    Past Surgical History:  Procedure Laterality Date   ABDOMINAL HYSTERECTOMY     PARTIAL HYSTERECTOMY   ACDF N/A    C5-6 ACDF Dr. Sherwood Gambler   breast     right, tumor benign   BREAST EXCISIONAL BIOPSY Right    1961 (age 59) benign   CARDIAC CATHETERIZATION  2014   CARDIAC CATHETERIZATION  2012   CARDIAC CATHETERIZATION N/A 07/22/2016   Procedure: Left Heart Cath and Coronary Angiography;  Surgeon: Belva Crome, MD;  Location: [place] [facility];  Service: Cardiovascular;  Laterality: N/A;   cardiac stent  2012   to RCA   CHOLECYSTECTOMY N/A 10/02/2017   Procedure: LAPAROSCOPIC CHOLECYSTECTOMY;  Surgeon: Ralene Ok, MD;  Location: [place];  Service: General;  Laterality: N/A;   CORONARY ANGIOPLASTY     ESOPHAGEAL  MANOMETRY N/A 10/29/2016   Procedure: ESOPHAGEAL MANOMETRY (EM);  Surgeon: Garlan Fair, MD;  Location: [facility];  Service: Endoscopy;  Laterality: N/A;   ESOPHAGOGASTRODUODENOSCOPY (EGD) WITH PROPOFOL N/A 10/28/2016   Procedure: ESOPHAGOGASTRODUODENOSCOPY (EGD) WITH PROPOFOL;  Surgeon: Garlan Fair, MD;  Location: [facility];  Service: Endoscopy;  Laterality: N/A;   EXCISION OF SKIN TAG N/A 07/29/2017   Procedure: EXCISION OF PERIANAL  SKIN TAG;  Surgeon: Ralene Ok, MD;  Location: [place];  Service: General;  Laterality: N/A;   KNEE SURGERY  left   left ear surgery     for bad cut   LEFT HEART CATHETERIZATION WITH CORONARY ANGIOGRAM N/A 02/22/2013   Procedure: LEFT HEART CATHETERIZATION WITH CORONARY ANGIOGRAM;  Surgeon: [name], MD;  Location: [facility];  Service: Cardiovascular;  Laterality: N/A;   REVERSE SHOULDER ARTHROPLASTY Right 04/07/2022   Procedure: RIGHT SHOULDER REVERSE SHOULDER ARTHROPLASTY;  Surgeon: Vanetta Mulders, MD;  Location: [place];  Service: Orthopedics;  Laterality: Right;   SHOULDER SURGERY Bilateral    rotator cuff   TOE SURGERY Left    toe surgery   Patient Active Problem List   Diagnosis Date Noted  Rotator cuff arthropathy of right shoulder    Carpal tunnel syndrome, right upper limb 01/03/2022   Chest pain 06/21/2021   Variant angina ([place]) 01/24/2017   Precordial chest pain 01/23/2017   Headache 01/23/2017   S/P angioplasty with stent 01/23/2017   Coronary artery disease 11/24/2014   Syncope 10/22/2014   Hyperglycemia 04/20/2014   H/O hiatal hernia 04/19/2014   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    Coronary artery vasospasm ([place]) 09/26/2013   HTN (hypertension) 12/08/2011   GERD (gastroesophageal reflux disease) 12/08/2011   Depression 12/08/2011   Dyslipidemia 12/08/2011     REFERRING PROVIDER: Vanetta Mulders MD  REFERRING DIAG: s/p Rt reverse TSA + biceps tenodesis, without subscap repair    THERAPY DIAG:  Acute pain of right  shoulder  Stiffness of right shoulder, not elsewhere classified  Muscle weakness (generalized)  Rationale for Evaluation and Treatment Rehabilitation  ONSET DATE: DOS 04/07/22  SUBJECTIVE:                                                                                                                                                                                      SUBJECTIVE STATEMENT: Pt is 11 weeks and 2 days s/p R reverse TSA and biceps tenodesis.  Pt states she had increased pain the evening after prior Rx on Monday and is still hurting today (Wednesday).  Pt state her shoulder is stiff and she has increased difficulty with lifting R UE.  Pt used ice and mm relaxers which didn't improve pain.  Pt is compliant with HEP.       PERTINENT HISTORY: H/o bil RCR R carpal tunnel release in July 2023  PAIN:  Are you having pain? Yes: NPRS scale: Current: 5/10, Worst:  6/10, Best:  2/10 Pain location: Rt anterior shoulder  Pain description: stinging Aggravating factors:   Relieving factors: pain meds  PRECAUTIONS: Shoulder, reverse TSA without subscap repair  WEIGHT BEARING RESTRICTIONS Yes NWB UE  FALLS:  Has patient fallen in last 6 months? No  LIVING ENVIRONMENT: Lives with: lives with their spouse No stairs  OCCUPATION: Not working  PLOF: Independent  PATIENT GOALS the use of my arm and hand back, gardening  OBJECTIVE:    TODAY'S TREATMENT:  Therapeutic Exercise: -Reviewed pt presentation, pain level, response to prior Rx, and HEP compliance. -Pt received R shoulder PROM in flexion, scaption, abd and ER per protocol ranges w/n pt and tissue tolerance.  Pt performed:  Pulleys in flexion,scaption, and abduction x 20-25 each  Supine flexion with head elevated 2 x 10 reps  S/L shoulder abduction with head elevated 2x10 reps   S/L ER 3x10  Standing wand flexion in front of mirror  with cuing to decrease shoulder hike 2 x 10 reps  Supine shoulder ABC x 1 rep with  multiple rest breaks       PATIENT EDUCATION: Education details: relevant anatomy, protocol expectations, POC, HEP, and exercise form/rationale. Person educated: Patient and Spouse Education method: Explanation, Demonstration, Tactile cues, Verbal cues, and Handouts Education comprehension: verbalized understanding, returned demonstration, verbal cues required, tactile cues required, and needs further education   HOME EXERCISE PROGRAM: [email or web address]     ASSESSMENT:  CLINICAL IMPRESSION: Pt presents to Rx reporting increased pain since prior Rx on Monday.  PT decreased exercises today.  Pt tolerated PROM well.  Though pt had increased pain today, she was able to perform supine flexion AROM and S/L abd with head elevated well without c/o's.  Pt responded well to Rx reporting improved pain from 5/10 before Rx to 2/10 after Rx.  Pt had improved stiffness after Rx.  Pt should benefit from cont skilled PT services per protocol to address ongoing goals and to assist in restoring desired level of function.       OBJECTIVE IMPAIRMENTS decreased activity tolerance, decreased knowledge of condition, decreased ROM, decreased strength, increased muscle spasms, impaired flexibility, impaired UE functional use, improper body mechanics, postural dysfunction, and pain.   ACTIVITY LIMITATIONS carrying, lifting, sleeping, bed mobility, bathing, dressing, reach over head, and hygiene/grooming  PARTICIPATION LIMITATIONS: meal prep, cleaning, laundry, driving, shopping, and yard work  PERSONAL FACTORS Past/current experiences and see PMH  are also affecting patient's functional outcome.   REHAB POTENTIAL: Good  CLINICAL DECISION MAKING: Stable/uncomplicated  EVALUATION COMPLEXITY: Low   GOALS:  SHORT TERM GOALS: Target date: 05/09/2022   AROM flexion to 90 without incr pain and good scap control Baseline:see chart Goal status: met in supine, not assessed in standing  2.  ER in scapular plane to 30  deg Baseline: see chart Goal status: GOAL MET  3.  Pain levels <=3/10 Baseline: see subj reports Goal status:  ONGOING  4.  D/c sling Baseline: as tolerated at 3 wk Goal status: GOAL MET   LONG TERM GOALS: Target date: 05/23/2022    1.  Pt will meet FOTO goal Baseline: see above Goal status: ONGOING Target date: 06/20/22  2.  AROM flexion to 130 without incr pain and good scap control Baseline:  Goal status: ONGOING Target date: 05/23/2022   3.  Use of arm for ADLs pain <=3/10 Baseline:  Goal status: ONGOING Target date: 05/23/2022   4.  Initiate functional IR behind back without incr pain Baseline: can begin at 6 weeks per post op protocol Goal status: NOT MET Target date: 05/23/2022   5.  Pt will demo R shoulder AROM to be [facility] t/o for performance of ADLs and IADLs.  Goal status: INITIAL Target date: 08/01/22  6.  Pt will be able to perform her ADLs and IADLs including reaching activities without significant pain and difficulty.  Goal status: INITIAL Target date: 08/01/22  7.  Pt will be able to reach into an overhead cabinet without significant difficulty.  Goal status: INITIAL Target date: 08/01/22    PLAN: PT FREQUENCY: 2x/wk   PT DURATION: 9 weeks  PLANNED INTERVENTIONS: Therapeutic exercises, Therapeutic activity, Neuromuscular re-education, Balance training, Gait training, Patient/Family education, Self Care, Joint mobilization, Aquatic Therapy, Dry Needling, Electrical stimulation, Spinal mobilization, Cryotherapy, Moist heat, scar mobilization, Taping, Vasopneumatic device, Manual therapy, and Re-evaluation  PLAN FOR NEXT SESSION: continue per Dr. Eddie Dibbles reverse TSA protocol.   Selinda Michaels III PT, DPT 06/26/22  10:37 PM

## 2022-07-01 ENCOUNTER — Encounter (HOSPITAL_BASED_OUTPATIENT_CLINIC_OR_DEPARTMENT_OTHER): Payer: Self-pay | Admitting: Physical Therapy

## 2022-07-01 ENCOUNTER — Ambulatory Visit (HOSPITAL_BASED_OUTPATIENT_CLINIC_OR_DEPARTMENT_OTHER): Payer: Medicare Other | Admitting: Physical Therapy

## 2022-07-01 DIAGNOSIS — M6281 Muscle weakness (generalized): Secondary | ICD-10-CM | POA: Diagnosis not present

## 2022-07-01 DIAGNOSIS — M25511 Pain in right shoulder: Secondary | ICD-10-CM

## 2022-07-01 DIAGNOSIS — M25611 Stiffness of right shoulder, not elsewhere classified: Secondary | ICD-10-CM

## 2022-07-01 NOTE — Therapy (Signed)
OUTPATIENT PHYSICAL THERAPY SHOULDER TREATMENT   Patient Name: [name] MRN: [id number] DOB:1946/03/04, 76 y.o., female Today's Date: 07/02/2022   PT End of Session - 07/01/22 1209     Visit Number 19    Number of Visits 28    Date for PT Re-Evaluation 08/01/22    Authorization Type UHC MCR    PT Start Time 1148    PT Stop Time 1230    PT Time Calculation (min) 42 min    Activity Tolerance Patient tolerated treatment well    Behavior During Therapy WFL for tasks assessed/performed                   Past Medical History:  Diagnosis Date   Anemia    Anginal pain ([place])    last cp was last year   Anxiety    Arthritis    Blood transfusion    Chest pain 02/04/2016   Coronary artery disease    Coronary artery spasm, wth continued episodes of chest pain.  09/26/2013   Coronary vasospasm (HCC)    Diabetes mellitus without complication (H[facility])    type II   GERD (gastroesophageal reflux disease)    Headache    Hiatal hernia    Hypercholesteremia    Hypertension    NSTEMI (non-ST elevated myocardial infarction) ([place]) not sure   NSVT (nonsustained ventricular tachycardia) ([place]) 09/26/2013   Panic attack    Past Surgical History:  Procedure Laterality Date   ABDOMINAL HYSTERECTOMY     PARTIAL HYSTERECTOMY   ACDF N/A    C5-6 ACDF Dr. Sherwood Gambler   breast     right, tumor benign   BREAST EXCISIONAL BIOPSY Right    1961 (age 28) benign   CARDIAC CATHETERIZATION  2014   CARDIAC CATHETERIZATION  2012   CARDIAC CATHETERIZATION N/A 07/22/2016   Procedure: Left Heart Cath and Coronary Angiography;  Surgeon: Belva Crome, MD;  Location: [place] [facility];  Service: Cardiovascular;  Laterality: N/A;   cardiac stent  2012   to RCA   CHOLECYSTECTOMY N/A 10/02/2017   Procedure: LAPAROSCOPIC CHOLECYSTECTOMY;  Surgeon: Ralene Ok, MD;  Location: [place];  Service: General;  Laterality: N/A;   CORONARY ANGIOPLASTY     ESOPHAGEAL MANOMETRY N/A 10/29/2016   Procedure:  ESOPHAGEAL MANOMETRY (EM);  Surgeon: Garlan Fair, MD;  [facility];  Service: Endoscopy;  Laterality: N/A;   ESOPHAGOGASTRODUODENOSCOPY (EGD) WITH PROPOFOL N/A 10/28/2016   Procedure: ESOPHAGOGASTRODUODENOSCOPY (EGD) WITH PROPOFOL;  Surgeon: Garlan Fair, MD;  Location: [facility];  Service: Endoscopy;  Laterality: N/A;   EXCISION OF SKIN TAG N/A 07/29/2017   Procedure: EXCISION OF PERIANAL  SKIN TAG;  Surgeon: Ralene Ok, MD;  Location: [place];  Service: General;  Laterality: N/A;   KNEE SURGERY  left   left ear surgery     for bad cut   LEFT HEART CATHETERIZATION WITH CORONARY ANGIOGRAM N/A 02/22/2013   Procedure: LEFT HEART CATHETERIZATION WITH CORONARY ANGIOGRAM;  Surgeon: [name], MD;  Location: [facility] CATH LAB;  Service: Cardiovascular;  Laterality: N/A;   REVERSE SHOULDER ARTHROPLASTY Right 04/07/2022   Procedure: RIGHT SHOULDER REVERSE SHOULDER ARTHROPLASTY;  Surgeon: Vanetta Mulders, MD;  Location: [place];  Service: Orthopedics;  Laterality: Right;   SHOULDER SURGERY Bilateral    rotator cuff   TOE SURGERY Left    toe surgery   Patient Active Problem List   Diagnosis Date Noted   Rotator cuff arthropathy of right shoulder  Carpal tunnel syndrome, right upper limb 01/03/2022   Chest pain 06/21/2021   Variant angina ([place]) 01/24/2017   Precordial chest pain 01/23/2017   Headache 01/23/2017   S/P angioplasty with stent 01/23/2017   Coronary artery disease 11/24/2014   Syncope 10/22/2014   Hyperglycemia 04/20/2014   H/O hiatal hernia 04/19/2014   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    Coronary artery vasospasm ([place]) 09/26/2013   HTN (hypertension) 12/08/2011   GERD (gastroesophageal reflux disease) 12/08/2011   Depression 12/08/2011   Dyslipidemia 12/08/2011     REFERRING PROVIDER: Vanetta Mulders MD  REFERRING DIAG: s/p Rt reverse TSA + biceps tenodesis, without subscap repair    THERAPY DIAG:  Acute pain of right shoulder  Stiffness of right shoulder, not  elsewhere classified  Muscle weakness (generalized)  Rationale for Evaluation and Treatment Rehabilitation  ONSET DATE: DOS 04/07/22  SUBJECTIVE:                                                                                                                                                                                      SUBJECTIVE STATEMENT: Pt is 12 weeks and 1 day s/p R reverse TSA and biceps tenodesis.  Pt states she is feeling better today.  Pt reports her R shoulder felt much better after prior Rx.  Pt continues to have stiffness and states sometimes it's better and sometimes note.  Pt is compliant with HEP.  Pt reports difficulty with doffing clothes.  She has minimal difficulty with bathing.  Pt reports her reaching is about the same.    PERTINENT HISTORY: H/o bil RCR R carpal tunnel release in July 2023  PAIN:  Are you having pain? Yes: NPRS scale: Current: 5/10, Worst:  6/10, Best:  2/10 Pain location: Rt anterior shoulder  Pain description: stinging Aggravating factors:   Relieving factors: pain meds  PRECAUTIONS: Shoulder, reverse TSA without subscap repair  WEIGHT BEARING RESTRICTIONS Yes NWB UE  FALLS:  Has patient fallen in last 6 months? No  LIVING ENVIRONMENT: Lives with: lives with their spouse No stairs  OCCUPATION: Not working  PLOF: Independent  PATIENT GOALS the use of my arm and hand back, gardening  OBJECTIVE:    TODAY'S TREATMENT:  Therapeutic Exercise: -Reviewed pt presentation, pain level, response to prior Rx, and HEP compliance. -Pt received R shoulder PROM in flexion, scaption, abd, ER, and IR per protocol ranges w/n pt and tissue tolerance.  Pt performed:  Pulleys in flexion,scaption, and abduction x 20-25 each  Supine flexion with head elevated 2 x 10 reps  S/L shoulder abduction with head elevated 2x10 reps   S/L ER 3x10  Standing wand flexion in front of mirror with  cuing to decrease shoulder hike 2 x 10 reps  Supine  shoulder ABC x 1 rep   Standing functional IR AROM 2x10  Standing wand scaption x 10 in front of mirror  Standing scap retraction with YTB 2x10 reps  PT updated HEP and gave pt a HEP handout.  Educated pt in correct form and appropriate frequency.         PATIENT EDUCATION: Education details: relevant anatomy, protocol expectations, POC, HEP, and exercise form/rationale. Person educated: Patient and Spouse Education method: Explanation, Demonstration, Tactile cues, Verbal cues, and Handouts Education comprehension: verbalized understanding, returned demonstration, verbal cues required, tactile cues required, and needs further education   HOME EXERCISE PROGRAM: [email or web address]   Updated HEP: - Scapular Retraction with Resistance  - 1 x daily - 3-4 x weekly - 2 sets - 10 reps - Supine Shoulder Alphabet  - 1-2 x daily - 7 x weekly - 1 reps    ASSESSMENT:  CLINICAL IMPRESSION: Pt presents to Rx stating her shoulder has felt better since last Rx.  She continues to have pain and difficulty with reaching and performing self care activities/ADLs.   increased pain since prior Rx on Monday.  Pt demonstrates improved R shoulder PROM and has improved tightness t/o R shoulder.  Pt did require cuing to decrease shoulder hike with standing wand flexion though demonstrates much improved shoulder compensatory hike.  Pt demonstrated improved ROM with standing wand flexion.  Pt had difficulty with limited range with standing wand scaption.  Pt demonstrates improved tolerance with supine shoulder ABC and was able to complete without resting.  Pt should benefit from cont skilled PT services per protocol to address ongoing goals and to assist in restoring desired level of function.     OBJECTIVE IMPAIRMENTS decreased activity tolerance, decreased knowledge of condition, decreased ROM, decreased strength, increased muscle spasms, impaired flexibility, impaired UE functional use, improper body mechanics, postural  dysfunction, and pain.   ACTIVITY LIMITATIONS carrying, lifting, sleeping, bed mobility, bathing, dressing, reach over head, and hygiene/grooming  PARTICIPATION LIMITATIONS: meal prep, cleaning, laundry, driving, shopping, and yard work  PERSONAL FACTORS Past/current experiences and see PMH  are also affecting patient's functional outcome.   REHAB POTENTIAL: Good  CLINICAL DECISION MAKING: Stable/uncomplicated  EVALUATION COMPLEXITY: Low   GOALS:  SHORT TERM GOALS: Target date: 05/09/2022   AROM flexion to 90 without incr pain and good scap control Baseline:see chart Goal status: met in supine, not assessed in standing  2.  ER in scapular plane to 30 deg Baseline: see chart Goal status: GOAL MET  3.  Pain levels <=3/10 Baseline: see subj reports Goal status:  ONGOING  4.  D/c sling Baseline: as tolerated at 3 wk Goal status: GOAL MET   LONG TERM GOALS: Target date: 05/23/2022    1.  Pt will meet FOTO goal Baseline: see above Goal status: ONGOING Target date: 06/20/22  2.  AROM flexion to 130 without incr pain and good scap control Baseline:  Goal status: ONGOING Target date: 05/23/2022   3.  Use of arm for ADLs pain <=3/10 Baseline:  Goal status: ONGOING Target date: 05/23/2022   4.  Initiate functional IR behind back without incr pain Baseline: can begin at 6 weeks per post op protocol Goal status: NOT MET Target date: 05/23/2022   5.  Pt will demo R shoulder AROM to be [facility] t/o for performance of ADLs and IADLs.  Goal status: INITIAL Target date: 08/01/22  6.  Pt will be able to  perform her ADLs and IADLs including reaching activities without significant pain and difficulty.  Goal status: INITIAL Target date: 08/01/22  7.  Pt will be able to reach into an overhead cabinet without significant difficulty.  Goal status: INITIAL Target date: 08/01/22    PLAN: PT FREQUENCY: 2x/wk   PT DURATION: 9 weeks  PLANNED INTERVENTIONS: Therapeutic  exercises, Therapeutic activity, Neuromuscular re-education, Balance training, Gait training, Patient/Family education, Self Care, Joint mobilization, Aquatic Therapy, Dry Needling, Electrical stimulation, Spinal mobilization, Cryotherapy, Moist heat, scar mobilization, Taping, Vasopneumatic device, Manual therapy, and Re-evaluation  PLAN FOR NEXT SESSION: continue per Dr. Eddie Dibbles reverse TSA protocol.   Selinda Michaels III PT, DPT 07/02/22 9:07 AM

## 2022-07-09 ENCOUNTER — Ambulatory Visit (HOSPITAL_BASED_OUTPATIENT_CLINIC_OR_DEPARTMENT_OTHER): Payer: Medicare Other | Admitting: Physical Therapy

## 2022-07-09 ENCOUNTER — Encounter (HOSPITAL_BASED_OUTPATIENT_CLINIC_OR_DEPARTMENT_OTHER): Payer: Self-pay | Admitting: Physical Therapy

## 2022-07-09 DIAGNOSIS — M6281 Muscle weakness (generalized): Secondary | ICD-10-CM | POA: Diagnosis not present

## 2022-07-09 DIAGNOSIS — M25511 Pain in right shoulder: Secondary | ICD-10-CM

## 2022-07-09 DIAGNOSIS — M25611 Stiffness of right shoulder, not elsewhere classified: Secondary | ICD-10-CM | POA: Diagnosis not present

## 2022-07-09 NOTE — Therapy (Signed)
OUTPATIENT PHYSICAL THERAPY SHOULDER TREATMENT / PROGRESS NOTE  Progress Note Reporting Period 06/04/2022 to 07/09/2022  See note below for Objective Data and Assessment of Progress/Goals.       Patient Name: [name] MRN: [id number] DOB:1946-02-25, 76 y.o., female Today's Date: 07/10/2022   PT End of Session - 07/09/22 0938     Visit Number 20    Number of Visits 32    Date for PT Re-Evaluation 08/20/22    Authorization Type U[employer] MCR    Progress Note Due on Visit 30    PT Start Time 0935    PT Stop Time 1015    PT Time Calculation (min) 40 min    Activity Tolerance Patient tolerated treatment well    Behavior During Therapy [facility] for tasks assessed/performed                    Past Medical History:  Diagnosis Date   Anemia    Anginal pain ([place])    last cp was last year   Anxiety    Arthritis    Blood transfusion    Chest pain 02/04/2016   Coronary artery disease    Coronary artery spasm, wth continued episodes of chest pain.  09/26/2013   Coronary vasospasm (HCC)    Diabetes mellitus without complication (HCC)    type II   GERD (gastroesophageal reflux disease)    Headache    Hiatal hernia    Hypercholesteremia    Hypertension    NSTEMI (non-ST elevated myocardial infarction) ([place]) not sure   NSVT (nonsustained ventricular tachycardia) ([place]) 09/26/2013   Panic attack    Past Surgical History:  Procedure Laterality Date   ABDOMINAL HYSTERECTOMY     PARTIAL HYSTERECTOMY   ACDF N/A    C5-6 ACDF Dr. Sherwood Gambler   breast     right, tumor benign   BREAST EXCISIONAL BIOPSY Right    1961 (age 70) benign   CARDIAC CATHETERIZATION  2014   CARDIAC CATHETERIZATION  2012   CARDIAC CATHETERIZATION N/A 07/22/2016   Procedure: Left Heart Cath and Coronary Angiography;  Surgeon: Belva Crome, MD;  Location: [place] [facility];  Service: Cardiovascular;  Laterality: N/A;   cardiac stent  2012   to RCA   CHOLECYSTECTOMY N/A 10/02/2017   Procedure:  LAPAROSCOPIC CHOLECYSTECTOMY;  Surgeon: Ralene Ok, MD;  Location: [place];  Service: General;  Laterality: N/A;   CORONARY ANGIOPLASTY     ESOPHAGEAL MANOMETRY N/A 10/29/2016   Procedure: ESOPHAGEAL MANOMETRY (EM);  Surgeon: Garlan Fair, MD;  Location: [facility];  Service: Endoscopy;  Laterality: N/A;   ESOPHAGOGASTRODUODENOSCOPY (EGD) WITH PROPOFOL N/A 10/28/2016   Procedure: ESOPHAGOGASTRODUODENOSCOPY (EGD) WITH PROPOFOL;  Surgeon: Garlan Fair, MD;  Location: [facility];  Service: Endoscopy;  Laterality: N/A;   EXCISION OF SKIN TAG N/A 07/29/2017   Procedure: EXCISION OF PERIANAL  SKIN TAG;  Surgeon: Ralene Ok, MD;  Location: [facility] [place] [facility] [place];  Service: General;  Laterality: N/A;   KNEE SURGERY  left   left ear surgery     for bad cut   LEFT HEART CATHETERIZATION WITH CORONARY ANGIOGRAM N/A 02/22/2013   Procedure: LEFT HEART CATHETERIZATION WITH CORONARY ANGIOGRAM;  Surgeon: [name], MD;  Location: [facility] CATH LAB;  Service: Cardiovascular;  Laterality: N/A;   REVERSE SHOULDER ARTHROPLASTY Right 04/07/2022   Procedure: RIGHT SHOULDER REVERSE SHOULDER ARTHROPLASTY;  Surgeon: Vanetta Mulders, MD;  Location: [place];  Service: Orthopedics;  Laterality: Right;   SHOULDER SURGERY Bilateral  rotator cuff   TOE SURGERY Left    toe surgery   Patient Active Problem List   Diagnosis Date Noted   Rotator cuff arthropathy of right shoulder    Carpal tunnel syndrome, right upper limb 01/03/2022   Chest pain 06/21/2021   Variant angina ([place]) 01/24/2017   Precordial chest pain 01/23/2017   Headache 01/23/2017   S/P angioplasty with stent 01/23/2017   Coronary artery disease 11/24/2014   Syncope 10/22/2014   Hyperglycemia 04/20/2014   H/O hiatal hernia 04/19/2014   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    Coronary artery vasospasm ([place]) 09/26/2013   HTN (hypertension) 12/08/2011   GERD (gastroesophageal reflux disease) 12/08/2011   Depression 12/08/2011   Dyslipidemia 12/08/2011     REFERRING  PROVIDER: Vanetta Mulders MD  REFERRING DIAG: s/p Rt reverse TSA + biceps tenodesis, without subscap repair    THERAPY DIAG:  Acute pain of right shoulder  Stiffness of right shoulder, not elsewhere classified  Muscle weakness (generalized)  Rationale for Evaluation and Treatment Rehabilitation  ONSET DATE: DOS 04/07/22  SUBJECTIVE:                                                                                                                                                                                      SUBJECTIVE STATEMENT: Pt is 13 weeks and 2 days s/p R reverse TSA and biceps tenodesis.  Pt is compliant with HEP.    RESPONSE TO PRIOR RX:  Pt states she felt good after prior Rx.   PAIN LEVELS:  "It's cold outside so it hurts more".   5/10 current, 5/10 worst,2 /10 best in R shoulder.  Pt has 2/10 pain with ADLs.  FUNCTIONAL IMPROVEMENTS:  reaching, washing face, brushing teeth, cooking, sweeping, dressing   FUNCTIONAL LIMITATIONS:  dressing, bathing, UE elevation, overhead activities, holding objects, lifting objects   PERTINENT HISTORY: H/o bil RCR R carpal tunnel release in July 2023   PRECAUTIONS: Shoulder, reverse TSA without subscap repair  WEIGHT BEARING RESTRICTIONS Yes NWB UE  FALLS:  Has patient fallen in last 6 months? No  LIVING ENVIRONMENT: Lives with: lives with their spouse No stairs  OCCUPATION: Not working  PLOF: Independent  PATIENT GOALS the use of my arm and hand back, gardening  OBJECTIVE:    TODAY'S TREATMENT:     PATIENT SURVEYS:  FOTO:  Prior/Current:  38/41. Goal of 47 at visit #20  R shoulder PROM/AROM:            Flexion:  143/135 in supine            Abd:  116            ER:  54/48   IR: 45 deg R shoulder Standing AAROM:  Flex:  134 deg, scaption:  78 deg R shoulder standing AROM:  flexion: 66 deg   Therapeutic Exercise: -Reviewed reported functional improvements/deficits, pain levels, response to prior Rx, and HEP  compliance. -Assessed ROM -Pt received R shoulder PROM in flexion, abd, ER, and IR per protocol ranges w/n pt and tissue tolerance. -See below for pt education  Pt performed:  Standing wand flexion in front of mirror with cuing to decrease shoulder hike x 10 reps  Supine shoulder ABC x 1 rep   Standing functional IR AROM 2x10  Standing wand scaption x 10 in front of mirror  Standing scap retraction with YTB and RTB x10 reps each       PATIENT EDUCATION: Education details: objective findings, goal progress, POC, HEP, and exercise form/rationale. Person educated: Patient and Spouse Education method: Explanation, Demonstration, Tactile cues, Verbal cues, and Handouts Education comprehension: verbalized understanding, returned demonstration, verbal cues required, tactile cues required, and needs further education   HOME EXERCISE PROGRAM: [id number]     ASSESSMENT:  CLINICAL IMPRESSION: Pt has improved pain with ADLs and is slowly making progress with performance of ADLs.  Pt is limited with reaching though is improving.  Pt has less pain overall.  Pt is making progress with ROM t/o shoulder including PROM and AROM.  Pt demonstrates improved standing wand flexion AAROM and has much difficulty with standing wand scaption.  She is very limited with standing flexion AROM.  Pt is progressing with exercises per protocol.  Pt has met STG's #2,4 and LTG # 3,4.  Pt continues to be limited with performance of ADLs and IADLs.  She should benefit from cont skilled PT services per protocol to address ongoing goals, improve ROM and strength, and assist in restoring desired level of function.       OBJECTIVE IMPAIRMENTS decreased activity tolerance, decreased knowledge of condition, decreased ROM, decreased strength, increased muscle spasms, impaired flexibility, impaired UE functional use, improper body mechanics, postural dysfunction, and pain.   ACTIVITY LIMITATIONS carrying, lifting, sleeping, bed  mobility, bathing, dressing, reach over head, and hygiene/grooming  PARTICIPATION LIMITATIONS: meal prep, cleaning, laundry, driving, shopping, and yard work  PERSONAL FACTORS Past/current experiences and see PMH  are also affecting patient's functional outcome.   REHAB POTENTIAL: Good  CLINICAL DECISION MAKING: Stable/uncomplicated  EVALUATION COMPLEXITY: Low   GOALS:  SHORT TERM GOALS: Target date: 05/09/2022   AROM flexion to 90 without incr pain and good scap control Baseline:see chart Goal status: met in supine, not met in standing  2.  ER in scapular plane to 30 deg Baseline: see chart Goal status: GOAL MET  3.  Pain levels <=3/10 Baseline: see subj reports Goal status:  ONGOING  4.  D/c sling Baseline: as tolerated at 3 wk Goal status: GOAL MET   LONG TERM GOALS: Target date: 08/20/2022     1.  Pt will meet FOTO goal Baseline: see above Goal status: ONGOING Target date: 06/20/22  2.  AROM flexion to 130 without incr pain and good scap control Baseline:  Goal status: ONGOING Target date: 05/23/2022   3.  Use of arm for ADLs pain <=3/10 Baseline:  Goal status: GOAL MET Target date: 05/23/2022   4.  Initiate functional IR behind back without incr pain Baseline: can begin at 6 weeks per post op protocol Goal status: GOAL MET Target date: 05/23/2022   5.  Pt will demo R shoulder AROM to be [facility]  t/o for performance of ADLs and IADLs.  Goal status: PROGRESSING Target date: 08/01/22  6.  Pt will be able to perform her ADLs and IADLs including reaching activities without significant pain and difficulty.  Goal status: PROGRESSING Target date: 08/01/22  7.  Pt will be able to reach into an overhead cabinet without significant difficulty.  Goal status: ONGOING Target date: 08/01/22    PLAN: PT FREQUENCY: 2x/wk   PT DURATION: 6 weeks  PLANNED INTERVENTIONS: Therapeutic exercises, Therapeutic activity, Neuromuscular re-education, Balance training,  Gait training, Patient/Family education, Self Care, Joint mobilization, Aquatic Therapy, Dry Needling, Electrical stimulation, Spinal mobilization, Cryotherapy, Moist heat, scar mobilization, Taping, Vasopneumatic device, Manual therapy, and Re-evaluation  PLAN FOR NEXT SESSION: continue per Dr. Eddie Dibbles reverse TSA protocol.   Selinda Michaels III PT, DPT 07/10/22 1:21 PM

## 2022-07-10 DIAGNOSIS — E118 Type 2 diabetes mellitus with unspecified complications: Secondary | ICD-10-CM | POA: Diagnosis not present

## 2022-07-11 ENCOUNTER — Encounter (HOSPITAL_BASED_OUTPATIENT_CLINIC_OR_DEPARTMENT_OTHER): Payer: Self-pay | Admitting: Physical Therapy

## 2022-07-11 ENCOUNTER — Ambulatory Visit (HOSPITAL_BASED_OUTPATIENT_CLINIC_OR_DEPARTMENT_OTHER): Payer: Medicare Other | Attending: Orthopaedic Surgery | Admitting: Physical Therapy

## 2022-07-11 DIAGNOSIS — M25511 Pain in right shoulder: Secondary | ICD-10-CM | POA: Diagnosis not present

## 2022-07-11 DIAGNOSIS — M25611 Stiffness of right shoulder, not elsewhere classified: Secondary | ICD-10-CM | POA: Insufficient documentation

## 2022-07-11 DIAGNOSIS — M6281 Muscle weakness (generalized): Secondary | ICD-10-CM | POA: Insufficient documentation

## 2022-07-11 NOTE — Therapy (Signed)
OUTPATIENT PHYSICAL THERAPY SHOULDER TREATMENT      Patient Name: [name] MRN: [id number] DOB:11/16/1945, 76 y.o., female Today's Date: 07/11/2022   PT End of Session - 07/11/22 1019     Visit Number 21    Number of Visits 32    Date for PT Re-Evaluation 08/20/22    Authorization Type UHC MCR    PT Start Time 7412    PT Stop Time 1057    PT Time Calculation (min) 42 min    Activity Tolerance Patient tolerated treatment well    Behavior During Therapy WFL for tasks assessed/performed                    Past Medical History:  Diagnosis Date   Anemia    Anginal pain ([place])    last cp was last year   Anxiety    Arthritis    Blood transfusion    Chest pain 02/04/2016   Coronary artery disease    Coronary artery spasm, wth continued episodes of chest pain.  09/26/2013   Coronary vasospasm (HCC)    Diabetes mellitus without complication (HCC)    type II   GERD (gastroesophageal reflux disease)    Headache    Hiatal hernia    Hypercholesteremia    Hypertension    NSTEMI (non-ST elevated myocardial infarction) ([place]) not sure   NSVT (nonsustained ventricular tachycardia) ([place]) 09/26/2013   Panic attack    Past Surgical History:  Procedure Laterality Date   ABDOMINAL HYSTERECTOMY     PARTIAL HYSTERECTOMY   ACDF N/A    C5-6 ACDF Dr. Sherwood Gambler   breast     right, tumor benign   BREAST EXCISIONAL BIOPSY Right    1961 (age 40) benign   CARDIAC CATHETERIZATION  2014   CARDIAC CATHETERIZATION  2012   CARDIAC CATHETERIZATION N/A 07/22/2016   Procedure: Left Heart Cath and Coronary Angiography;  Surgeon: Belva Crome, MD;  Location: [place] [facility];  Service: Cardiovascular;  Laterality: N/A;   cardiac stent  2012   to RCA   CHOLECYSTECTOMY N/A 10/02/2017   Procedure: LAPAROSCOPIC CHOLECYSTECTOMY;  Surgeon: Ralene Ok, MD;  Location: [place];  Service: General;  Laterality: N/A;   CORONARY ANGIOPLASTY     ESOPHAGEAL MANOMETRY N/A 10/29/2016    Procedure: ESOPHAGEAL MANOMETRY (EM);  Surgeon: Garlan Fair, MD;  [facility];  Service: Endoscopy;  Laterality: N/A;   ESOPHAGOGASTRODUODENOSCOPY (EGD) WITH PROPOFOL N/A 10/28/2016   Procedure: ESOPHAGOGASTRODUODENOSCOPY (EGD) WITH PROPOFOL;  Surgeon: Garlan Fair, MD;  [facility]: [facility];  Service: Endoscopy;  Laterality: N/A;   EXCISION OF SKIN TAG N/A 07/29/2017   Procedure: EXCISION OF PERIANAL  SKIN TAG;  Surgeon: Ralene Ok, MD;  Location: [place];  Service: General;  Laterality: N/A;   KNEE SURGERY  left   left ear surgery     for bad cut   LEFT HEART CATHETERIZATION WITH CORONARY ANGIOGRAM N/A 02/22/2013   Procedure: LEFT HEART CATHETERIZATION WITH CORONARY ANGIOGRAM;  Surgeon: [name], MD;  Location: [facility] CATH LAB;  Service: Cardiovascular;  Laterality: N/A;   REVERSE SHOULDER ARTHROPLASTY Right 04/07/2022   Procedure: RIGHT SHOULDER REVERSE SHOULDER ARTHROPLASTY;  Surgeon: Vanetta Mulders, MD;  Location: [place];  Service: Orthopedics;  Laterality: Right;   SHOULDER SURGERY Bilateral    rotator cuff   TOE SURGERY Left    toe surgery   Patient Active Problem List   Diagnosis Date Noted   Rotator cuff arthropathy of  right shoulder    Carpal tunnel syndrome, right upper limb 01/03/2022   Chest pain 06/21/2021   Variant angina ([place]) 01/24/2017   Precordial chest pain 01/23/2017   Headache 01/23/2017   S/P angioplasty with stent 01/23/2017   Coronary artery disease 11/24/2014   Syncope 10/22/2014   Hyperglycemia 04/20/2014   H/O hiatal hernia 04/19/2014   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    Coronary artery vasospasm ([place]) 09/26/2013   HTN (hypertension) 12/08/2011   GERD (gastroesophageal reflux disease) 12/08/2011   Depression 12/08/2011   Dyslipidemia 12/08/2011     REFERRING PROVIDER: Vanetta Mulders MD  REFERRING DIAG: s/p Rt reverse TSA + biceps tenodesis, without subscap repair    THERAPY DIAG:  Acute pain of right shoulder  Stiffness of right  shoulder, not elsewhere classified  Muscle weakness (generalized)  Rationale for Evaluation and Treatment Rehabilitation  ONSET DATE: DOS 04/07/22  SUBJECTIVE:                                                                                                                                                                                      SUBJECTIVE STATEMENT: Pt is 13 weeks and 4 days s/p R reverse TSA and biceps tenodesis.  Pt is compliant with HEP.  Pt states she still has soreness with the lowering phase of flexion including in standing and supine with head elevated.   RESPONSE TO PRIOR RX:  Pt states she felt ok after prior Rx.   PAIN LEVELS:  2/10 pain currently more soreness than pain ; 5/10 worst,2 /10 best in R shoulder.  Pt has 2/10 pain with ADLs.  FUNCTIONAL IMPROVEMENTS:  reaching, washing face, brushing teeth, cooking, sweeping, dressing   FUNCTIONAL LIMITATIONS:  dressing, bathing, UE elevation, overhead activities, holding objects, lifting objects   PERTINENT HISTORY: H/o bil RCR R carpal tunnel release in July 2023   PRECAUTIONS: Shoulder, reverse TSA without subscap repair  WEIGHT BEARING RESTRICTIONS Yes NWB UE  FALLS:  Has patient fallen in last 6 months? No  LIVING ENVIRONMENT: Lives with: lives with their spouse No stairs  OCCUPATION: Not working  PLOF: Independent  PATIENT GOALS the use of my arm and hand back, gardening  OBJECTIVE:    TODAY'S TREATMENT:    R shoulder standing AROM:  flexion: 86 deg   Therapeutic Exercise: -Reviewed reported functional improvements/deficits, pain levels, response to prior Rx, and HEP compliance. -Assessed ROM -Pt received R shoulder PROM in flexion, abd, scaption, ER, and IR per protocol ranges w/n pt and tissue tolerance. -See below for pt education  Pt performed:  Supine flexion AROM with head elevated x 10 reps  Standing wand flexion in front of mirror with  cuing to decrease shoulder hike x 10  reps  Standing flexion AROM 2x5 reps  Attempted standing wall walks in flexion though pt unable to perform  Standing wand scaption x 10 in front of mirror  Supine shoulder ABC x 1 rep   Standing functional IR AROM 2x10  Standing scap retraction with RTB 2x10 reps each  Standing shoulder extension with retraction with YTB 2x10  Supine serratus punches with YTB 2x10  S/L ER 1# 2x10       PATIENT EDUCATION: Education details: objective findings, POC, HEP, and exercise form/rationale. Person educated: Patient and Spouse Education method: Explanation, Demonstration, Tactile cues, Verbal cues, and Handouts Education comprehension: verbalized understanding, returned demonstration, verbal cues required, tactile cues required, and needs further education   HOME EXERCISE PROGRAM: [id number]     ASSESSMENT:  CLINICAL IMPRESSION: Pt is slowly making progress with performance of ADLs.  Pt is limited with reaching though is improving.  Pt has less pain overall.  Pt tolerated PROM well and is improving with PROM.  Pt demonstrated a 20 deg increase in R shoulder flexion AROM in standing today.  PT had pt attempt standing wall walks in flexion though pt was unable to.  Pt is improving with standing wand flexion AAROM and has difficulty with standing wand scaption.  Pt required cuing and instruction to perform standing wand scaption.  PT progressed intensity of exercises per protocol with adding light resistance to select exercises.  She tolerated light resistance well without c/o's.  Pt responded well to Rx having no increased pain after Rx.  She should benefit from cont skilled PT services per protocol to address ongoing goals, improve ROM and strength, and assist in restoring desired level of function.       OBJECTIVE IMPAIRMENTS decreased activity tolerance, decreased knowledge of condition, decreased ROM, decreased strength, increased muscle spasms, impaired flexibility, impaired UE functional use,  improper body mechanics, postural dysfunction, and pain.   ACTIVITY LIMITATIONS carrying, lifting, sleeping, bed mobility, bathing, dressing, reach over head, and hygiene/grooming  PARTICIPATION LIMITATIONS: meal prep, cleaning, laundry, driving, shopping, and yard work  PERSONAL FACTORS Past/current experiences and see PMH  are also affecting patient's functional outcome.   REHAB POTENTIAL: Good  CLINICAL DECISION MAKING: Stable/uncomplicated  EVALUATION COMPLEXITY: Low   GOALS:  SHORT TERM GOALS: Target date: 05/09/2022   AROM flexion to 90 without incr pain and good scap control Baseline:see chart Goal status: met in supine, not met in standing  2.  ER in scapular plane to 30 deg Baseline: see chart Goal status: GOAL MET  3.  Pain levels <=3/10 Baseline: see subj reports Goal status:  ONGOING  4.  D/c sling Baseline: as tolerated at 3 wk Goal status: GOAL MET   LONG TERM GOALS: Target date: 08/20/2022     1.  Pt will meet FOTO goal Baseline: see above Goal status: ONGOING Target date: 06/20/22  2.  AROM flexion to 130 without incr pain and good scap control Baseline:  Goal status: ONGOING Target date: 05/23/2022   3.  Use of arm for ADLs pain <=3/10 Baseline:  Goal status: GOAL MET Target date: 05/23/2022   4.  Initiate functional IR behind back without incr pain Baseline: can begin at 6 weeks per post op protocol Goal status: GOAL MET Target date: 05/23/2022   5.  Pt will demo R shoulder AROM to be [facility] t/o for performance of ADLs and IADLs.  Goal status: PROGRESSING Target date: 08/01/22  6.  Pt will  be able to perform her ADLs and IADLs including reaching activities without significant pain and difficulty.  Goal status: PROGRESSING Target date: 08/01/22  7.  Pt will be able to reach into an overhead cabinet without significant difficulty.  Goal status: ONGOING Target date: 08/01/22    PLAN: PT FREQUENCY: 2x/wk   PT DURATION: 6  weeks  PLANNED INTERVENTIONS: Therapeutic exercises, Therapeutic activity, Neuromuscular re-education, Balance training, Gait training, Patient/Family education, Self Care, Joint mobilization, Aquatic Therapy, Dry Needling, Electrical stimulation, Spinal mobilization, Cryotherapy, Moist heat, scar mobilization, Taping, Vasopneumatic device, Manual therapy, and Re-evaluation  PLAN FOR NEXT SESSION: continue per Dr. Eddie Dibbles reverse TSA protocol.   Selinda Michaels III PT, DPT 07/11/22 3:59 PM

## 2022-07-16 ENCOUNTER — Ambulatory Visit (HOSPITAL_BASED_OUTPATIENT_CLINIC_OR_DEPARTMENT_OTHER): Payer: Medicare Other | Admitting: Physical Therapy

## 2022-07-16 ENCOUNTER — Encounter (HOSPITAL_BASED_OUTPATIENT_CLINIC_OR_DEPARTMENT_OTHER): Payer: Self-pay | Admitting: Physical Therapy

## 2022-07-16 DIAGNOSIS — M6281 Muscle weakness (generalized): Secondary | ICD-10-CM

## 2022-07-16 DIAGNOSIS — M25511 Pain in right shoulder: Secondary | ICD-10-CM

## 2022-07-16 DIAGNOSIS — M25611 Stiffness of right shoulder, not elsewhere classified: Secondary | ICD-10-CM | POA: Diagnosis not present

## 2022-07-16 NOTE — Therapy (Signed)
OUTPATIENT PHYSICAL THERAPY SHOULDER TREATMENT      Patient Name: [name] MRN: [id number] DOB:06/10/1946, 76 y.o., female Today's Date: 07/17/2022   PT End of Session - 07/16/22 1027     Visit Number 22    Number of Visits 32    Date for PT Re-Evaluation 08/20/22    Authorization Type UHC MCR    Progress Note Due on Visit 30    PT Start Time 1021    PT Stop Time 1100    PT Time Calculation (min) 39 min    Activity Tolerance Patient tolerated treatment well    Behavior During Therapy WFL for tasks assessed/performed                    Past Medical History:  Diagnosis Date   Anemia    Anginal pain ([place])    last cp was last year   Anxiety    Arthritis    Blood transfusion    Chest pain 02/04/2016   Coronary artery disease    Coronary artery spasm, wth continued episodes of chest pain.  09/26/2013   Coronary vasospasm (HCC)    Diabetes mellitus without complication (HCC)    type II   GERD (gastroesophageal reflux disease)    Headache    Hiatal hernia    Hypercholesteremia    Hypertension    NSTEMI (non-ST elevated myocardial infarction) ([place]) not sure   NSVT (nonsustained ventricular tachycardia) ([place]) 09/26/2013   Panic attack    Past Surgical History:  Procedure Laterality Date   ABDOMINAL HYSTERECTOMY     PARTIAL HYSTERECTOMY   ACDF N/A    C5-6 ACDF Dr. Sherwood Gambler   breast     right, tumor benign   BREAST EXCISIONAL BIOPSY Right    1961 (age 27) benign   CARDIAC CATHETERIZATION  2014   CARDIAC CATHETERIZATION  2012   CARDIAC CATHETERIZATION N/A 07/22/2016   Procedure: Left Heart Cath and Coronary Angiography;  Surgeon: Belva Crome, MD;  Location: [place] [facility];  Service: Cardiovascular;  Laterality: N/A;   cardiac stent  2012   to RCA   CHOLECYSTECTOMY N/A 10/02/2017   Procedure: LAPAROSCOPIC CHOLECYSTECTOMY;  Surgeon: Ralene Ok, MD;  Location: [place];  Service: General;  Laterality: N/A;   CORONARY ANGIOPLASTY      ESOPHAGEAL MANOMETRY N/A 10/29/2016   Procedure: ESOPHAGEAL MANOMETRY (EM);  Surgeon: Garlan Fair, MD;  Location: [facility];  Service: Endoscopy;  Laterality: N/A;   ESOPHAGOGASTRODUODENOSCOPY (EGD) WITH PROPOFOL N/A 10/28/2016   Procedure: ESOPHAGOGASTRODUODENOSCOPY (EGD) WITH PROPOFOL;  Surgeon: Garlan Fair, MD;  Location: [facility];  Service: Endoscopy;  Laterality: N/A;   EXCISION OF SKIN TAG N/A 07/29/2017   Procedure: EXCISION OF PERIANAL  SKIN TAG;  Surgeon: Ralene Ok, MD;  Location: [place];  Service: General;  Laterality: N/A;   KNEE SURGERY  left   left ear surgery     for bad cut   LEFT HEART CATHETERIZATION WITH CORONARY ANGIOGRAM N/A 02/22/2013   Procedure: LEFT HEART CATHETERIZATION WITH CORONARY ANGIOGRAM;  Surgeon: [name], MD;  Location: [facility];  Service: Cardiovascular;  Laterality: N/A;   REVERSE SHOULDER ARTHROPLASTY Right 04/07/2022   Procedure: RIGHT SHOULDER REVERSE SHOULDER ARTHROPLASTY;  Surgeon: Vanetta Mulders, MD;  Location: [place];  Service: Orthopedics;  Laterality: Right;   SHOULDER SURGERY Bilateral    rotator cuff   TOE SURGERY Left    toe surgery   Patient Active Problem List  Diagnosis Date Noted   Rotator cuff arthropathy of right shoulder    Carpal tunnel syndrome, right upper limb 01/03/2022   Chest pain 06/21/2021   Variant angina ([place]) 01/24/2017   Precordial chest pain 01/23/2017   Headache 01/23/2017   S/P angioplasty with stent 01/23/2017   Coronary artery disease 11/24/2014   Syncope 10/22/2014   Hyperglycemia 04/20/2014   H/O hiatal hernia 04/19/2014   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    Coronary artery vasospasm ([place]) 09/26/2013   HTN (hypertension) 12/08/2011   GERD (gastroesophageal reflux disease) 12/08/2011   Depression 12/08/2011   Dyslipidemia 12/08/2011     REFERRING PROVIDER: Vanetta Mulders MD  REFERRING DIAG: s/p Rt reverse TSA + biceps tenodesis, without subscap repair    THERAPY DIAG:  Acute pain of  right shoulder  Stiffness of right shoulder, not elsewhere classified  Muscle weakness (generalized)  Rationale for Evaluation and Treatment Rehabilitation  ONSET DATE: DOS 04/07/22  SUBJECTIVE:                                                                                                                                                                                      SUBJECTIVE STATEMENT: Pt is 14 weeks and 2 days s/p R reverse TSA and biceps tenodesis.  Pt is compliant with HEP.  Pt reports having stiffness in R shoulder. RESPONSE TO PRIOR RX:  Pt denies any adverse effects after prior Rx.   PAIN LEVELS:  1/10 pain currently.  "It's an ache" ; 5/10 worst,2 /10 best in R shoulder.  Pt has 2/10 pain with ADLs.  FUNCTIONAL IMPROVEMENTS:  reaching, washing face, brushing teeth, cooking, sweeping, dressing.  Pt reports improved reaching behind head to doff shirt. FUNCTIONAL LIMITATIONS:  dressing, bathing, UE elevation, overhead activities, holding objects, lifting objects   PERTINENT HISTORY: H/o bil RCR R carpal tunnel release in July 2023   PRECAUTIONS: Shoulder, reverse TSA without subscap repair  WEIGHT BEARING RESTRICTIONS Yes NWB UE  FALLS:  Has patient fallen in last 6 months? No  LIVING ENVIRONMENT: Lives with: lives with their spouse No stairs  OCCUPATION: Not working  PLOF: Independent  PATIENT GOALS the use of my arm and hand back, gardening  OBJECTIVE:    TODAY'S TREATMENT:   Therapeutic Exercise: -Reviewed current function, pain levels, response to prior Rx, and HEP compliance. -Pt received R shoulder PROM in flexion, abd, scaption, ER, and IR per protocol ranges w/n pt and tissue tolerance. -See below for pt education  Pt performed:  Pulleys x 20 in flexion and scaption  Standing wand flexion in front of mirror with cuing to decrease shoulder hike x 10 reps  Standing flexion AROM 2x5 reps  Standing wand scaption 2 x 10-12 in front of  mirror  Supine shoulder ABC x 1 rep   Standing functional IR AROM x10  Standing wand IR x 10 reps  Standing scap retraction with RTB 2x10 reps each  Standing shoulder extension with retraction with YTB 2x10  Supine serratus punches with YTB 2x10  S/L ER 1# 2x10       PATIENT EDUCATION: Education details: objective findings, POC, HEP, and exercise form/rationale. Person educated: Patient and Spouse Education method: Explanation, Demonstration, Tactile cues, Verbal cues, and Handouts Education comprehension: verbalized understanding, returned demonstration, verbal cues required, tactile cues required, and needs further education   HOME EXERCISE PROGRAM: [email or web address]     ASSESSMENT:  CLINICAL IMPRESSION: Pt is improving with pain.  She reports improved performance of reaching behind head to doff shirt.  Pt is limited with reaching though is improving.  Pt tolerated PROM well and is improving with PROM.   Pt has much difficulty with form with standing wand scaption and requires much instruction and verbal and tactile cues.  Pt is progressing with protocol with added light resistance and tolerating it well.  Pt responded well to Rx having no increased pain after Rx.  She should benefit from cont skilled PT services per protocol to address ongoing goals, improve ROM and strength, and assist in restoring desired level of function.     OBJECTIVE IMPAIRMENTS decreased activity tolerance, decreased knowledge of condition, decreased ROM, decreased strength, increased muscle spasms, impaired flexibility, impaired UE functional use, improper body mechanics, postural dysfunction, and pain.   ACTIVITY LIMITATIONS carrying, lifting, sleeping, bed mobility, bathing, dressing, reach over head, and hygiene/grooming  PARTICIPATION LIMITATIONS: meal prep, cleaning, laundry, driving, shopping, and yard work  PERSONAL FACTORS Past/current experiences and see PMH  are also affecting patient's functional  outcome.   REHAB POTENTIAL: Good  CLINICAL DECISION MAKING: Stable/uncomplicated  EVALUATION COMPLEXITY: Low   GOALS:  SHORT TERM GOALS: Target date: 05/09/2022   AROM flexion to 90 without incr pain and good scap control Baseline:see chart Goal status: met in supine, not met in standing  2.  ER in scapular plane to 30 deg Baseline: see chart Goal status: GOAL MET  3.  Pain levels <=3/10 Baseline: see subj reports Goal status:  ONGOING  4.  D/c sling Baseline: as tolerated at 3 wk Goal status: GOAL MET   LONG TERM GOALS: Target date: 08/20/2022     1.  Pt will meet FOTO goal Baseline: see above Goal status: ONGOING Target date: 06/20/22  2.  AROM flexion to 130 without incr pain and good scap control Baseline:  Goal status: ONGOING Target date: 05/23/2022   3.  Use of arm for ADLs pain <=3/10 Baseline:  Goal status: GOAL MET Target date: 05/23/2022   4.  Initiate functional IR behind back without incr pain Baseline: can begin at 6 weeks per post op protocol Goal status: GOAL MET Target date: 05/23/2022   5.  Pt will demo R shoulder AROM to be [facility] t/o for performance of ADLs and IADLs.  Goal status: PROGRESSING Target date: 08/01/22  6.  Pt will be able to perform her ADLs and IADLs including reaching activities without significant pain and difficulty.  Goal status: PROGRESSING Target date: 08/01/22  7.  Pt will be able to reach into an overhead cabinet without significant difficulty.  Goal status: ONGOING Target date: 08/01/22    PLAN: PT FREQUENCY: 2x/wk   PT DURATION: 6 weeks  PLANNED INTERVENTIONS: Therapeutic exercises,  Therapeutic activity, Neuromuscular re-education, Balance training, Gait training, Patient/Family education, Self Care, Joint mobilization, Aquatic Therapy, Dry Needling, Electrical stimulation, Spinal mobilization, Cryotherapy, Moist heat, scar mobilization, Taping, Vasopneumatic device, Manual therapy, and  Re-evaluation  PLAN FOR NEXT SESSION: continue per Dr. Eddie Dibbles reverse TSA protocol.   Selinda Michaels III PT, DPT 07/17/22 9:28 AM

## 2022-07-18 ENCOUNTER — Encounter (HOSPITAL_BASED_OUTPATIENT_CLINIC_OR_DEPARTMENT_OTHER): Payer: Self-pay | Admitting: Physical Therapy

## 2022-07-18 ENCOUNTER — Ambulatory Visit (HOSPITAL_BASED_OUTPATIENT_CLINIC_OR_DEPARTMENT_OTHER): Payer: Medicare Other | Admitting: Physical Therapy

## 2022-07-18 DIAGNOSIS — M25511 Pain in right shoulder: Secondary | ICD-10-CM | POA: Diagnosis not present

## 2022-07-18 DIAGNOSIS — M25611 Stiffness of right shoulder, not elsewhere classified: Secondary | ICD-10-CM

## 2022-07-18 DIAGNOSIS — M6281 Muscle weakness (generalized): Secondary | ICD-10-CM

## 2022-07-18 NOTE — Therapy (Signed)
OUTPATIENT PHYSICAL THERAPY SHOULDER TREATMENT      Patient Name: [name] MRN: [id number] DOB:[birth year], 76 y.o., female Today's Date: 07/18/2022   PT End of Session - 07/18/22 1024     Visit Number 23    Number of Visits 32    Date for PT Re-Evaluation 08/20/22    Authorization Type UHC MCR    PT Start Time 1020    PT Stop Time 1100    PT Time Calculation (min) 40 min    Activity Tolerance Patient tolerated treatment well    Behavior During Therapy WFL for tasks assessed/performed                    Past Medical History:  Diagnosis Date   Anemia    Anginal pain ([place])    last cp was last year   Anxiety    Arthritis    Blood transfusion    Chest pain 02/04/2016   Coronary artery disease    Coronary artery spasm, wth continued episodes of chest pain.  09/26/2013   Coronary vasospasm (HCC)    Diabetes mellitus without complication (HCC)    type II   GERD (gastroesophageal reflux disease)    Headache    Hiatal hernia    Hypercholesteremia    Hypertension    NSTEMI (non-ST elevated myocardial infarction) ([place]) not sure   NSVT (nonsustained ventricular tachycardia) ([place]) 09/26/2013   Panic attack    Past Surgical History:  Procedure Laterality Date   ABDOMINAL HYSTERECTOMY     PARTIAL HYSTERECTOMY   ACDF N/A    C5-6 ACDF Dr. Sherwood Gambler   breast     right, tumor benign   BREAST EXCISIONAL BIOPSY Right    1961 (age 70) benign   CARDIAC CATHETERIZATION  2014   CARDIAC CATHETERIZATION  2012   CARDIAC CATHETERIZATION N/A 07/22/2016   Procedure: Left Heart Cath and Coronary Angiography;  Surgeon: Belva Crome, MD;  Location: [facility];  Service: Cardiovascular;  Laterality: N/A;   cardiac stent  2012   to RCA   CHOLECYSTECTOMY N/A 10/02/2017   Procedure: LAPAROSCOPIC CHOLECYSTECTOMY;  Surgeon: Ralene Ok, MD;  Location: [place];  Service: General;  Laterality: N/A;   CORONARY ANGIOPLASTY     ESOPHAGEAL MANOMETRY N/A 10/29/2016    Procedure: ESOPHAGEAL MANOMETRY (EM);  Surgeon: Garlan Fair, MD;  [facility]: [facility];  Service: Endoscopy;  Laterality: N/A;   ESOPHAGOGASTRODUODENOSCOPY (EGD) WITH PROPOFOL N/A 10/28/2016   Procedure: ESOPHAGOGASTRODUODENOSCOPY (EGD) WITH PROPOFOL;  Surgeon: Garlan Fair, MD;  [facility];  Service: Endoscopy;  Laterality: N/A;   EXCISION OF SKIN TAG N/A 07/29/2017   Procedure: EXCISION OF PERIANAL  SKIN TAG;  Surgeon: Ralene Ok, MD;  Location: [place];  Service: General;  Laterality: N/A;   KNEE SURGERY  left   left ear surgery     for bad cut   LEFT HEART CATHETERIZATION WITH CORONARY ANGIOGRAM N/A 02/22/2013   Procedure: LEFT HEART CATHETERIZATION WITH CORONARY ANGIOGRAM;  Surgeon: [name], MD;  Location[facility];  Service: Cardiovascular;  Laterality: N/A;   REVERSE SHOULDER ARTHROPLASTY Right 04/07/2022   Procedure: RIGHT SHOULDER REVERSE SHOULDER ARTHROPLASTY;  Surgeon: Vanetta Mulders, MD;  Location: [place];  Service: Orthopedics;  Laterality: Right;   SHOULDER SURGERY Bilateral    rotator cuff   TOE SURGERY Left    toe surgery   Patient Active Problem List   Diagnosis Date Noted   Rotator cuff arthropathy of  right shoulder    Carpal tunnel syndrome, right upper limb 01/03/2022   Chest pain 06/21/2021   Variant angina ([place]) 01/24/2017   Precordial chest pain 01/23/2017   Headache 01/23/2017   S/P angioplasty with stent 01/23/2017   Coronary artery disease 11/24/2014   Syncope 10/22/2014   Hyperglycemia 04/20/2014   H/O hiatal hernia 04/19/2014   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    Coronary artery vasospasm ([place]) 09/26/2013   HTN (hypertension) 12/08/2011   GERD (gastroesophageal reflux disease) 12/08/2011   Depression 12/08/2011   Dyslipidemia 12/08/2011     REFERRING PROVIDER: Vanetta Mulders MD  REFERRING DIAG: s/p Rt reverse TSA + biceps tenodesis, without subscap repair    THERAPY DIAG:  Acute pain of right shoulder  Stiffness of right  shoulder, not elsewhere classified  Muscle weakness (generalized)  Rationale for Evaluation and Treatment Rehabilitation  ONSET DATE: DOS 04/07/22  SUBJECTIVE:                                                                                                                                                                                      SUBJECTIVE STATEMENT: Pt is 14 weeks and 4 days s/p R reverse TSA and biceps tenodesis.  Pt states she is very sore and stiff today.  Pt is compliant with HEP.  She reports having increased difficulty and less ROM with exercises yesterday though she did them.  Pt has increased pain and stiffness in wrist.    RESPONSE TO PRIOR RX:  Pt states she didn't have increased pain after prior Rx though did have increased pain and soreness the following day.     PAIN LEVELS:  4/10 pain currently.  "It's an ache" ; 5/10 worst,2 /10 best in R shoulder.  Pt has 2/10 pain with ADLs.  FUNCTIONAL IMPROVEMENTS:  reaching, washing face, brushing teeth, cooking, sweeping, dressing.  Pt reports improved reaching behind head to doff shirt. FUNCTIONAL LIMITATIONS:  dressing, bathing, UE elevation, overhead activities, holding objects, lifting objects   PERTINENT HISTORY: H/o bil RCR R carpal tunnel release in July 2023   PRECAUTIONS: Shoulder, reverse TSA without subscap repair  WEIGHT BEARING RESTRICTIONS Yes NWB UE  FALLS:  Has patient fallen in last 6 months? No  LIVING ENVIRONMENT: Lives with: lives with their spouse No stairs  OCCUPATION: Not working  PLOF: Independent  PATIENT GOALS the use of my arm and hand back, gardening  OBJECTIVE:    TODAY'S TREATMENT:   Therapeutic Exercise: -Reviewed current function, pain levels, response to prior Rx, and HEP compliance. -Pt received R shoulder PROM in flexion, abd, scaption, ER, and IR per protocol ranges w/n pt and tissue tolerance. -See below for pt  education  Pt performed:  Pulleys x 20 each in  flexion, scaption, and abduction  Standing wand flexion in front of mirror with cuing to decrease shoulder hike x 10 reps  Standing flexion AROM 2x5 reps  Standing wand scaption 2 x 10-12 in front of mirror  Shoulder flexion with some assistance at end range of eccentric portion seated on bench inclined at 5 2x10 reps  Supine shoulder ABC x 1 rep   Standing functional IR AROM 2x10  Standing wand IR x 10 reps  Standing scap retraction with RTB 2x10 reps each  Standing shoulder extension with retraction with YTB 2x10  Supine serratus punches with YTB 2x10  S/L ER 1# 2x10       PATIENT EDUCATION: Education details: objective findings, POC, HEP, and exercise form/rationale. Person educated: Patient and Spouse Education method: Explanation, Demonstration, Tactile cues, Verbal cues, and Handouts Education comprehension: verbalized understanding, returned demonstration, verbal cues required, tactile cues required, and needs further education   HOME EXERCISE PROGRAM: [id number]     ASSESSMENT:  CLINICAL IMPRESSION: Pt presents to Rx reporting increased stiffness and soreness today.  She has increased pain today though overall has been improving in pain.  Pt is limited with reaching and UE elevation though is improving.  Pt demonstrates improved standing flexion AAROM.  PT had pt perform shoulder flexion AROM seated on incline bench to improve UE elevation AROM.   Pt has much difficulty with form with standing wand scaption and requires much instruction and verbal and tactile cues.  Pt is progressing with exercises per protocol without c/o's.  She responded well to Rx stating her shoulder felt a little better.  Her pain improved form 4/10 before Rx to 3/10 pain after Rx.  She should benefit from cont skilled PT services per protocol to address ongoing goals, improve ROM and strength, and assist in restoring desired level of function.    OBJECTIVE IMPAIRMENTS decreased activity tolerance,  decreased knowledge of condition, decreased ROM, decreased strength, increased muscle spasms, impaired flexibility, impaired UE functional use, improper body mechanics, postural dysfunction, and pain.   ACTIVITY LIMITATIONS carrying, lifting, sleeping, bed mobility, bathing, dressing, reach over head, and hygiene/grooming  PARTICIPATION LIMITATIONS: meal prep, cleaning, laundry, driving, shopping, and yard work  PERSONAL FACTORS Past/current experiences and see PMH  are also affecting patient's functional outcome.   REHAB POTENTIAL: Good  CLINICAL DECISION MAKING: Stable/uncomplicated  EVALUATION COMPLEXITY: Low   GOALS:  SHORT TERM GOALS: Target date: 05/09/2022   AROM flexion to 90 without incr pain and good scap control Baseline:see chart Goal status: met in supine, not met in standing  2.  ER in scapular plane to 30 deg Baseline: see chart Goal status: GOAL MET  3.  Pain levels <=3/10 Baseline: see subj reports Goal status:  ONGOING  4.  D/c sling Baseline: as tolerated at 3 wk Goal status: GOAL MET   LONG TERM GOALS: Target date: 08/20/2022     1.  Pt will meet FOTO goal Baseline: see above Goal status: ONGOING Target date: 06/20/22  2.  AROM flexion to 130 without incr pain and good scap control Baseline:  Goal status: ONGOING Target date: 05/23/2022   3.  Use of arm for ADLs pain <=3/10 Baseline:  Goal status: GOAL MET Target date: 05/23/2022   4.  Initiate functional IR behind back without incr pain Baseline: can begin at 6 weeks per post op protocol Goal status: GOAL MET Target date: 05/23/2022   5.  Pt will  demo R shoulder AROM to be [facility] t/o for performance of ADLs and IADLs.  Goal status: PROGRESSING Target date: 08/01/22  6.  Pt will be able to perform her ADLs and IADLs including reaching activities without significant pain and difficulty.  Goal status: PROGRESSING Target date: 08/01/22  7.  Pt will be able to reach into an overhead  cabinet without significant difficulty.  Goal status: ONGOING Target date: 08/01/22    PLAN: PT FREQUENCY: 2x/wk   PT DURATION: 6 weeks  PLANNED INTERVENTIONS: Therapeutic exercises, Therapeutic activity, Neuromuscular re-education, Balance training, Gait training, Patient/Family education, Self Care, Joint mobilization, Aquatic Therapy, Dry Needling, Electrical stimulation, Spinal mobilization, Cryotherapy, Moist heat, scar mobilization, Taping, Vasopneumatic device, Manual therapy, and Re-evaluation  PLAN FOR NEXT SESSION: continue per Dr. Eddie Dibbles reverse TSA protocol.   Selinda Michaels III PT, DPT 07/18/22 6:04 PM

## 2022-07-23 ENCOUNTER — Ambulatory Visit (HOSPITAL_BASED_OUTPATIENT_CLINIC_OR_DEPARTMENT_OTHER): Payer: Medicare Other | Admitting: Physical Therapy

## 2022-07-23 ENCOUNTER — Encounter (HOSPITAL_BASED_OUTPATIENT_CLINIC_OR_DEPARTMENT_OTHER): Payer: Self-pay | Admitting: Physical Therapy

## 2022-07-23 DIAGNOSIS — M25611 Stiffness of right shoulder, not elsewhere classified: Secondary | ICD-10-CM | POA: Diagnosis not present

## 2022-07-23 DIAGNOSIS — M6281 Muscle weakness (generalized): Secondary | ICD-10-CM | POA: Diagnosis not present

## 2022-07-23 DIAGNOSIS — M25511 Pain in right shoulder: Secondary | ICD-10-CM | POA: Diagnosis not present

## 2022-07-23 NOTE — Therapy (Signed)
OUTPATIENT PHYSICAL THERAPY SHOULDER TREATMENT      Patient Name: [name] MRN: [id number] DOB:[birth year], 76 y.o., female Today's Date: 07/24/2022   PT End of Session - 07/23/22 1026     Visit Number 24    Number of Visits 32    Date for PT Re-Evaluation 08/20/22    Authorization Type U[employer] MCR    PT Start Time 1022    PT Stop Time 1102    PT Time Calculation (min) 40 min    Activity Tolerance Patient tolerated treatment well    Behavior During Therapy WFL for tasks assessed/performed                    Past Medical History:  Diagnosis Date   Anemia    Anginal pain ([place])    last cp was last year   Anxiety    Arthritis    Blood transfusion    Chest pain 02/04/2016   Coronary artery disease    Coronary artery spasm, wth continued episodes of chest pain.  09/26/2013   Coronary vasospasm (H[facility])    Diabetes mellitus without complication (HCC)    type II   GERD (gastroesophageal reflux disease)    Headache    Hiatal hernia    Hypercholesteremia    Hypertension    NSTEMI (non-ST elevated myocardial infarction) ([place]) not sure   NSVT (nonsustained ventricular tachycardia) ([place]) 09/26/2013   Panic attack    Past Surgical History:  Procedure Laterality Date   ABDOMINAL HYSTERECTOMY     PARTIAL HYSTERECTOMY   ACDF N/A    C5-6 ACDF Dr. Sherwood Gambler   breast     right, tumor benign   BREAST EXCISIONAL BIOPSY Right    1961 (age 30) benign   CARDIAC CATHETERIZATION  2014   CARDIAC CATHETERIZATION  2012   CARDIAC CATHETERIZATION N/A 07/22/2016   Procedure: Left Heart Cath and Coronary Angiography;  Surgeon: Belva Crome, MD;  Location: [place] [facility];  Service: Cardiovascular;  Laterality: N/A;   cardiac stent  2012   to RCA   CHOLECYSTECTOMY N/A 10/02/2017   Procedure: LAPAROSCOPIC CHOLECYSTECTOMY;  Surgeon: Ralene Ok, MD;  Location: [place];  Service: General;  Laterality: N/A;   CORONARY ANGIOPLASTY     ESOPHAGEAL MANOMETRY N/A 10/29/2016    Procedure: ESOPHAGEAL MANOMETRY (EM);  Surgeon: Garlan Fair, MD;  [facility];  Service: Endoscopy;  Laterality: N/A;   ESOPHAGOGASTRODUODENOSCOPY (EGD) WITH PROPOFOL N/A 10/28/2016   Procedure: ESOPHAGOGASTRODUODENOSCOPY (EGD) WITH PROPOFOL;  Surgeon: Garlan Fair, MD;  Location: [facility];  Service: Endoscopy;  Laterality: N/A;   EXCISION OF SKIN TAG N/A 07/29/2017   Procedure: EXCISION OF PERIANAL  SKIN TAG;  Surgeon: Ralene Ok, MD;  Location: [place];  Service: General;  Laterality: N/A;   KNEE SURGERY  left   left ear surgery     for bad cut   LEFT HEART CATHETERIZATION WITH CORONARY ANGIOGRAM N/A 02/22/2013   Procedure: LEFT HEART CATHETERIZATION WITH CORONARY ANGIOGRAM;  Surgeon: [name], MD;  Location: [facility];  Service: Cardiovascular;  Laterality: N/A;   REVERSE SHOULDER ARTHROPLASTY Right 04/07/2022   Procedure: RIGHT SHOULDER REVERSE SHOULDER ARTHROPLASTY;  Surgeon: Vanetta Mulders, MD;  Location: [place];  Service: Orthopedics;  Laterality: Right;   SHOULDER SURGERY Bilateral    rotator cuff   TOE SURGERY Left    toe surgery   Patient Active Problem List   Diagnosis Date Noted   Rotator cuff arthropathy of  right shoulder    Carpal tunnel syndrome, right upper limb 01/03/2022   Chest pain 06/21/2021   Variant angina ([place]) 01/24/2017   Precordial chest pain 01/23/2017   Headache 01/23/2017   S/P angioplasty with stent 01/23/2017   Coronary artery disease 11/24/2014   Syncope 10/22/2014   Hyperglycemia 04/20/2014   H/O hiatal hernia 04/19/2014   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    Coronary artery vasospasm ([place]) 09/26/2013   HTN (hypertension) 12/08/2011   GERD (gastroesophageal reflux disease) 12/08/2011   Depression 12/08/2011   Dyslipidemia 12/08/2011     REFERRING PROVIDER: Vanetta Mulders MD  REFERRING DIAG: s/p Rt reverse TSA + biceps tenodesis, without subscap repair    THERAPY DIAG:  Acute pain of right shoulder  Stiffness of right  shoulder, not elsewhere classified  Muscle weakness (generalized)  Rationale for Evaluation and Treatment Rehabilitation  ONSET DATE: DOS 04/07/22  SUBJECTIVE:                                                                                                                                                                                      SUBJECTIVE STATEMENT: Pt is 15 weeks and 2 days s/p R reverse TSA and biceps tenodesis.  Pt states she felt better after prior Rx and had reduced stiffness.  Pt reports she hurt her back yesterday lifting wood though did not lift wood with R UE.   Pt is compliant with HEP.       PAIN LEVELS:  Pt denies pain today, just soreness.  Pt has 2/10 pain with ADLs.  FUNCTIONAL IMPROVEMENTS:  Improved pain with ADLs.  reaching, washing face, brushing teeth, cooking, sweeping, dressing.  Pt reports improved reaching behind head to doff shirt. FUNCTIONAL LIMITATIONS:  dressing, bathing, UE elevation, overhead activities, holding objects, lifting objects   PERTINENT HISTORY: H/o bil RCR R carpal tunnel release in July 2023   PRECAUTIONS: Shoulder, reverse TSA without subscap repair  WEIGHT BEARING RESTRICTIONS Yes    FALLS:  Has patient fallen in last 6 months? No  LIVING ENVIRONMENT: Lives with: lives with their spouse No stairs  OCCUPATION: Not working  PLOF: Independent  PATIENT GOALS the use of my arm and hand back, gardening  OBJECTIVE:    TODAY'S TREATMENT:   Therapeutic Exercise: -Reviewed current function, pain levels, response to prior Rx, and HEP compliance. -Pt received R shoulder PROM in flexion, abd, scaption, ER, and IR per protocol ranges w/n pt and tissue tolerance. -See below for pt education  Pt performed:  Pulleys x 20 each in flexion and scaption  Standing wand flexion in front of mirror with cuing to decrease shoulder hike x 10 reps  Standing flexion  AROM 2x10 reps  Standing wand scaption 2 x 10 in front of  mirror  Standing scaption AROM in front of mirror 2x5 to 90 deg  Shoulder flexion seated on bench inclined at 5 2x10 reps  Supine shoulder ABC x 1 rep   Standing scap retraction with RTB 2x10 reps each  Standing shoulder extension with retraction with YTB 2x10  Supine serratus punches with 1# 2x10  Prone extension with 1# 2x10  S/L ER 1# 2x10      PATIENT EDUCATION: Education details: objective findings, POC, HEP, and exercise form/rationale. Person educated: Patient and Spouse Education method: Explanation, Demonstration, Tactile cues, Verbal cues, and Handouts Education comprehension: verbalized understanding, returned demonstration, verbal cues required, tactile cues required, and needs further education   HOME EXERCISE PROGRAM: [id number]     ASSESSMENT:  CLINICAL IMPRESSION: Pt presents to Rx feeling better today.  She responded well to prior Rx and reports improved pain with ADLs.  Pt is improving with UE elevation as evidenced by performance of exercises.  She demonstrates improved ROM and ease with seated flexion AROM seated on incline and did not require any assistance from PT.  Pt was also able to perform standing scaption to 90 deg well.  PT is adding resistance to select exercises per protocol and pt performed exercises well.  She responded well to Rx reporting no pain after Rx.  She should benefit from cont skilled PT services per protocol to address ongoing goals, improve ROM and strength, and assist in restoring desired level of function.     OBJECTIVE IMPAIRMENTS decreased activity tolerance, decreased knowledge of condition, decreased ROM, decreased strength, increased muscle spasms, impaired flexibility, impaired UE functional use, improper body mechanics, postural dysfunction, and pain.   ACTIVITY LIMITATIONS carrying, lifting, sleeping, bed mobility, bathing, dressing, reach over head, and hygiene/grooming  PARTICIPATION LIMITATIONS: meal prep, cleaning, laundry,  driving, shopping, and yard work  PERSONAL FACTORS Past/current experiences and see PMH  are also affecting patient's functional outcome.   REHAB POTENTIAL: Good  CLINICAL DECISION MAKING: Stable/uncomplicated  EVALUATION COMPLEXITY: Low   GOALS:  SHORT TERM GOALS: Target date: 05/09/2022   AROM flexion to 90 without incr pain and good scap control Baseline:see chart Goal status: met in supine, not met in standing  2.  ER in scapular plane to 30 deg Baseline: see chart Goal status: GOAL MET  3.  Pain levels <=3/10 Baseline: see subj reports Goal status:  ONGOING  4.  D/c sling Baseline: as tolerated at 3 wk Goal status: GOAL MET   LONG TERM GOALS: Target date: 08/20/2022     1.  Pt will meet FOTO goal Baseline: see above Goal status: ONGOING Target date: 06/20/22  2.  AROM flexion to 130 without incr pain and good scap control Baseline:  Goal status: ONGOING Target date: 05/23/2022   3.  Use of arm for ADLs pain <=3/10 Baseline:  Goal status: GOAL MET Target date: 05/23/2022   4.  Initiate functional IR behind back without incr pain Baseline: can begin at 6 weeks per post op protocol Goal status: GOAL MET Target date: 05/23/2022   5.  Pt will demo R shoulder AROM to be [facility] t/o for performance of ADLs and IADLs.  Goal status: PROGRESSING Target date: 08/01/22  6.  Pt will be able to perform her ADLs and IADLs including reaching activities without significant pain and difficulty.  Goal status: PROGRESSING Target date: 08/01/22  7.  Pt will be able to reach into an  overhead cabinet without significant difficulty.  Goal status: ONGOING Target date: 08/01/22    PLAN: PT FREQUENCY: 2x/wk   PT DURATION: 6 weeks  PLANNED INTERVENTIONS: Therapeutic exercises, Therapeutic activity, Neuromuscular re-education, Balance training, Gait training, Patient/Family education, Self Care, Joint mobilization, Aquatic Therapy, Dry Needling, Electrical stimulation,  Spinal mobilization, Cryotherapy, Moist heat, scar mobilization, Taping, Vasopneumatic device, Manual therapy, and Re-evaluation  PLAN FOR NEXT SESSION: continue per Dr. Eddie Dibbles reverse TSA protocol. Attempt T band ER next visit.    Selinda Michaels III PT, DPT 07/24/22 3:15 PM

## 2022-07-25 ENCOUNTER — Encounter (HOSPITAL_BASED_OUTPATIENT_CLINIC_OR_DEPARTMENT_OTHER): Payer: Self-pay | Admitting: Physical Therapy

## 2022-07-25 ENCOUNTER — Ambulatory Visit (HOSPITAL_BASED_OUTPATIENT_CLINIC_OR_DEPARTMENT_OTHER): Payer: Medicare Other | Admitting: Physical Therapy

## 2022-07-25 DIAGNOSIS — M25511 Pain in right shoulder: Secondary | ICD-10-CM | POA: Diagnosis not present

## 2022-07-25 DIAGNOSIS — M25611 Stiffness of right shoulder, not elsewhere classified: Secondary | ICD-10-CM

## 2022-07-25 DIAGNOSIS — M6281 Muscle weakness (generalized): Secondary | ICD-10-CM

## 2022-07-25 NOTE — Therapy (Signed)
OUTPATIENT PHYSICAL THERAPY SHOULDER TREATMENT      Patient Name: [name] MRN: [id number] DOB:07/30/1946, 76 y.o., female Today's Date: 07/26/2022   PT End of Session - 07/25/22 1021     Visit Number 25    Number of Visits 32    Date for PT Re-Evaluation 08/20/22    Authorization Type UHC MCR    PT Start Time 1018    PT Stop Time 1101    PT Time Calculation (min) 43 min    Activity Tolerance Patient tolerated treatment well    Behavior During Therapy WFL for tasks assessed/performed                    Past Medical History:  Diagnosis Date   Anemia    Anginal pain ([place])    last cp was last year   Anxiety    Arthritis    Blood transfusion    Chest pain 02/04/2016   Coronary artery disease    Coronary artery spasm, wth continued episodes of chest pain.  09/26/2013   Coronary vasospasm (HCC)    Diabetes mellitus without complication (HCC)    type II   GERD (gastroesophageal reflux disease)    Headache    Hiatal hernia    Hypercholesteremia    Hypertension    NSTEMI (non-ST elevated myocardial infarction) ([place]) not sure   NSVT (nonsustained ventricular tachycardia) ([place]) 09/26/2013   Panic attack    Past Surgical History:  Procedure Laterality Date   ABDOMINAL HYSTERECTOMY     PARTIAL HYSTERECTOMY   ACDF N/A    C5-6 ACDF Dr. Sherwood Gambler   breast     right, tumor benign   BREAST EXCISIONAL BIOPSY Right    1961 (age 89) benign   CARDIAC CATHETERIZATION  2014   CARDIAC CATHETERIZATION  2012   CARDIAC CATHETERIZATION N/A 07/22/2016   Procedure: Left Heart Cath and Coronary Angiography;  Surgeon: Belva Crome, MD;  Location: [place] [facility];  Service: Cardiovascular;  Laterality: N/A;   cardiac stent  2012   to RCA   CHOLECYSTECTOMY N/A 10/02/2017   Procedure: LAPAROSCOPIC CHOLECYSTECTOMY;  Surgeon: Ralene Ok, MD;  Location: [place];  Service: General;  Laterality: N/A;   CORONARY ANGIOPLASTY     ESOPHAGEAL MANOMETRY N/A 10/29/2016    Procedure: ESOPHAGEAL MANOMETRY (EM);  Surgeon: Garlan Fair, MD;  [facility];  Service: Endoscopy;  Laterality: N/A;   ESOPHAGOGASTRODUODENOSCOPY (EGD) WITH PROPOFOL N/A 10/28/2016   Procedure: ESOPHAGOGASTRODUODENOSCOPY (EGD) WITH PROPOFOL;  Surgeon: Garlan Fair, MD;  [facility];  Service: Endoscopy;  Laterality: N/A;   EXCISION OF SKIN TAG N/A 07/29/2017   Procedure: EXCISION OF PERIANAL  SKIN TAG;  Surgeon: Ralene Ok, MD;  Location: [place];  Service: General;  Laterality: N/A;   KNEE SURGERY  left   left ear surgery     for bad cut   LEFT HEART CATHETERIZATION WITH CORONARY ANGIOGRAM N/A 02/22/2013   Procedure: LEFT HEART CATHETERIZATION WITH CORONARY ANGIOGRAM;  Surgeon: [name], MD;  Location: [facility];  Service: Cardiovascular;  Laterality: N/A;   REVERSE SHOULDER ARTHROPLASTY Right 04/07/2022   Procedure: RIGHT SHOULDER REVERSE SHOULDER ARTHROPLASTY;  Surgeon: Vanetta Mulders, MD;  Location: [place];  Service: Orthopedics;  Laterality: Right;   SHOULDER SURGERY Bilateral    rotator cuff   TOE SURGERY Left    toe surgery   Patient Active Problem List   Diagnosis Date Noted   Rotator cuff arthropathy of  right shoulder    Carpal tunnel syndrome, right upper limb 01/03/2022   Chest pain 06/21/2021   Variant angina ([place]) 01/24/2017   Precordial chest pain 01/23/2017   Headache 01/23/2017   S/P angioplasty with stent 01/23/2017   Coronary artery disease 11/24/2014   Syncope 10/22/2014   Hyperglycemia 04/20/2014   H/O hiatal hernia 04/19/2014   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    Coronary artery vasospasm ([place]) 09/26/2013   HTN (hypertension) 12/08/2011   GERD (gastroesophageal reflux disease) 12/08/2011   Depression 12/08/2011   Dyslipidemia 12/08/2011     REFERRING PROVIDER: Vanetta Mulders MD  REFERRING DIAG: s/p Rt reverse TSA + biceps tenodesis, without subscap repair    THERAPY DIAG:  Acute pain of right shoulder  Stiffness of right  shoulder, not elsewhere classified  Muscle weakness (generalized)  Rationale for Evaluation and Treatment Rehabilitation  ONSET DATE: DOS 04/07/22  SUBJECTIVE:                                                                                                                                                                                      SUBJECTIVE STATEMENT: Pt is 15 weeks and 4 days s/p R reverse TSA and biceps tenodesis.  Pt denies any adverse effects after prior Rx, just a little soreness.  Pt states her back feels better today.  Pt is compliant with HEP.       PAIN LEVELS:  Pt denies pain today, just soreness.  Pt has 2/10 pain with ADLs.  FUNCTIONAL IMPROVEMENTS:  Improved pain with ADLs.  reaching, washing face, brushing teeth, cooking, sweeping, dressing.  Pt reports improved reaching behind head to doff shirt. FUNCTIONAL LIMITATIONS:  dressing, bathing, UE elevation, overhead activities, holding objects, lifting objects   PERTINENT HISTORY: H/o bil RCR R carpal tunnel release in July 2023   PRECAUTIONS: Shoulder, reverse TSA without subscap repair  WEIGHT BEARING RESTRICTIONS Yes    FALLS:  Has patient fallen in last 6 months? No  LIVING ENVIRONMENT: Lives with: lives with their spouse No stairs  OCCUPATION: Not working  PLOF: Independent  PATIENT GOALS the use of my arm and hand back, gardening  OBJECTIVE:    TODAY'S TREATMENT:   Therapeutic Exercise: -Reviewed current function, pain levels, response to prior Rx, and HEP compliance. -Pt received R shoulder PROM in flexion, abd, scaption, ER, and IR per protocol ranges w/n pt and tissue tolerance. -See below for pt education  Pt performed:  Pulleys x 20 each in flexion, scaption, and abd  Standing wand flexion in front of mirror with cuing to decrease shoulder hike x 15 reps  Standing flexion AROM x10 reps  Standing scaption AROM in front of  mirror 2x10 to 90 deg  Shelf reach x 10 reps  Shoulder  flexion seated on bench inclined at 5 2x10 reps  independently  Supine shoulder ABC x 1 rep   Standing scap retraction with RTB 2x10 reps   Standing shoulder extension with retraction with YTB 2x10  Supine serratus punches with 1# 2x10  Prone extension with 1# 2x10  Standing ER with YTB 2x10      PATIENT EDUCATION: Education details:  POC, HEP, and exercise form/rationale. Person educated: Patient and Spouse Education method: Explanation, Demonstration, Tactile cues, Verbal cues, and Handouts Education comprehension: verbalized understanding, returned demonstration, verbal cues required, tactile cues required, and needs further education   HOME EXERCISE PROGRAM: [email or web address]     ASSESSMENT:  CLINICAL IMPRESSION: Pt continues to have limitations with ADLs/IADLs though is making progress.  Pt reports reduced pain with ADLs.  PT added shelf reach though pt was limited.  Pt is improving with UE elevation as evidenced by performance of exercises.  Pt demonstrates improved shoulder flexion AROM seated on incline bench including improved control.  Pt was also able to perform standing scaption to 90 deg well.  Pt performed exercises per protocol well.  She responded well to Rx reporting no pain after Rx.  She should benefit from cont skilled PT services per protocol to address ongoing goals, improve ROM and strength, and assist in restoring desired level of function.      OBJECTIVE IMPAIRMENTS decreased activity tolerance, decreased knowledge of condition, decreased ROM, decreased strength, increased muscle spasms, impaired flexibility, impaired UE functional use, improper body mechanics, postural dysfunction, and pain.   ACTIVITY LIMITATIONS carrying, lifting, sleeping, bed mobility, bathing, dressing, reach over head, and hygiene/grooming  PARTICIPATION LIMITATIONS: meal prep, cleaning, laundry, driving, shopping, and yard work  PERSONAL FACTORS Past/current experiences and see PMH  are also  affecting patient's functional outcome.   REHAB POTENTIAL: Good  CLINICAL DECISION MAKING: Stable/uncomplicated  EVALUATION COMPLEXITY: Low   GOALS:  SHORT TERM GOALS: Target date: 05/09/2022   AROM flexion to 90 without incr pain and good scap control Baseline:see chart Goal status: met in supine, not met in standing  2.  ER in scapular plane to 30 deg Baseline: see chart Goal status: GOAL MET  3.  Pain levels <=3/10 Baseline: see subj reports Goal status:  ONGOING  4.  D/c sling Baseline: as tolerated at 3 wk Goal status: GOAL MET   LONG TERM GOALS: Target date: 08/20/2022     1.  Pt will meet FOTO goal Baseline: see above Goal status: ONGOING Target date: 06/20/22  2.  AROM flexion to 130 without incr pain and good scap control Baseline:  Goal status: ONGOING Target date: 05/23/2022   3.  Use of arm for ADLs pain <=3/10 Baseline:  Goal status: GOAL MET Target date: 05/23/2022   4.  Initiate functional IR behind back without incr pain Baseline: can begin at 6 weeks per post op protocol Goal status: GOAL MET Target date: 05/23/2022   5.  Pt will demo R shoulder AROM to be [facility] t/o for performance of ADLs and IADLs.  Goal status: PROGRESSING Target date: 08/01/22  6.  Pt will be able to perform her ADLs and IADLs including reaching activities without significant pain and difficulty.  Goal status: PROGRESSING Target date: 08/01/22  7.  Pt will be able to reach into an overhead cabinet without significant difficulty.  Goal status: ONGOING Target date: 08/01/22    PLAN: PT FREQUENCY: 2x/wk  PT DURATION: 6 weeks  PLANNED INTERVENTIONS: Therapeutic exercises, Therapeutic activity, Neuromuscular re-education, Balance training, Gait training, Patient/Family education, Self Care, Joint mobilization, Aquatic Therapy, Dry Needling, Electrical stimulation, Spinal mobilization, Cryotherapy, Moist heat, scar mobilization, Taping, Vasopneumatic device, Manual  therapy, and Re-evaluation  PLAN FOR NEXT SESSION: continue per Dr. Eddie Dibbles reverse TSA protocol.    Selinda Michaels III PT, DPT 07/26/22 6:48 AM

## 2022-07-30 ENCOUNTER — Ambulatory Visit (HOSPITAL_BASED_OUTPATIENT_CLINIC_OR_DEPARTMENT_OTHER): Payer: Medicare Other | Admitting: Physical Therapy

## 2022-07-30 ENCOUNTER — Encounter (HOSPITAL_BASED_OUTPATIENT_CLINIC_OR_DEPARTMENT_OTHER): Payer: Self-pay | Admitting: Physical Therapy

## 2022-07-30 DIAGNOSIS — M25611 Stiffness of right shoulder, not elsewhere classified: Secondary | ICD-10-CM | POA: Diagnosis not present

## 2022-07-30 DIAGNOSIS — M6281 Muscle weakness (generalized): Secondary | ICD-10-CM

## 2022-07-30 DIAGNOSIS — M25511 Pain in right shoulder: Secondary | ICD-10-CM | POA: Diagnosis not present

## 2022-07-30 NOTE — Therapy (Signed)
OUTPATIENT PHYSICAL THERAPY SHOULDER TREATMENT      Patient Name: [name] MRN: [id number] DOB:Nov 03, 1945, 76 y.o., female Today's Date: 07/31/2022   PT End of Session - 07/30/22 1107     Visit Number 26    Number of Visits 32    Date for PT Re-Evaluation 08/20/22    Authorization Type UHC MCR    PT Start Time 1024    PT Stop Time 1106    PT Time Calculation (min) 42 min    Activity Tolerance Patient tolerated treatment well    Behavior During Therapy WFL for tasks assessed/performed                     Past Medical History:  Diagnosis Date   Anemia    Anginal pain ([place])    last cp was last year   Anxiety    Arthritis    Blood transfusion    Chest pain 02/04/2016   Coronary artery disease    Coronary artery spasm, wth continued episodes of chest pain.  09/26/2013   Coronary vasospasm ([facility])    Diabetes mellitus without complication (H[facility])    type II   GERD (gastroesophageal reflux disease)    Headache    Hiatal hernia    Hypercholesteremia    Hypertension    NSTEMI (non-ST elevated myocardial infarction) ([place]) not sure   NSVT (nonsustained ventricular tachycardia) ([place]) 09/26/2013   Panic attack    Past Surgical History:  Procedure Laterality Date   ABDOMINAL HYSTERECTOMY     PARTIAL HYSTERECTOMY   ACDF N/A    C5-6 ACDF Dr. Sherwood Gambler   breast     right, tumor benign   BREAST EXCISIONAL BIOPSY Right    1961 (age 89) benign   CARDIAC CATHETERIZATION  2014   CARDIAC CATHETERIZATION  2012   CARDIAC CATHETERIZATION N/A 07/22/2016   Procedure: Left Heart Cath and Coronary Angiography;  Surgeon: Belva Crome, MD;  Location: [facility];  Service: Cardiovascular;  Laterality: N/A;   cardiac stent  2012   to RCA   CHOLECYSTECTOMY N/A 10/02/2017   Procedure: LAPAROSCOPIC CHOLECYSTECTOMY;  Surgeon: Ralene Ok, MD;  Location: [facility];  Service: General;  Laterality: N/A;   CORONARY ANGIOPLASTY     ESOPHAGEAL MANOMETRY N/A 10/29/2016    Procedure: ESOPHAGEAL MANOMETRY (EM);  Surgeon: Garlan Fair, MD;  [facility]: [facility];  Service: Endoscopy;  Laterality: N/A;   ESOPHAGOGASTRODUODENOSCOPY (EGD) WITH PROPOFOL N/A 10/28/2016   Procedure: ESOPHAGOGASTRODUODENOSCOPY (EGD) WITH PROPOFOL;  Surgeon: Garlan Fair, MD;  [facility]: [facility];  Service: Endoscopy;  Laterality: N/A;   EXCISION OF SKIN TAG N/A 07/29/2017   Procedure: EXCISION OF PERIANAL  SKIN TAG;  Surgeon: Ralene Ok, MD;  Location: [facility];  Service: General;  Laterality: N/A;   KNEE SURGERY  left   left ear surgery     for bad cut   LEFT HEART CATHETERIZATION WITH CORONARY ANGIOGRAM N/A 02/22/2013   Procedure: LEFT HEART CATHETERIZATION WITH CORONARY ANGIOGRAM;  Surgeon: [name], MD;  Location: [facility];  Service: Cardiovascular;  Laterality: N/A;   REVERSE SHOULDER ARTHROPLASTY Right 04/07/2022   Procedure: RIGHT SHOULDER REVERSE SHOULDER ARTHROPLASTY;  Surgeon: Vanetta Mulders, MD;  Location: [place];  Service: Orthopedics;  Laterality: Right;   SHOULDER SURGERY Bilateral    rotator cuff   TOE SURGERY Left    toe surgery   Patient Active Problem List   Diagnosis Date Noted   Rotator cuff arthropathy  of right shoulder    Carpal tunnel syndrome, right upper limb 01/03/2022   Chest pain 06/21/2021   Variant angina ([place]) 01/24/2017   Precordial chest pain 01/23/2017   Headache 01/23/2017   S/P angioplasty with stent 01/23/2017   Coronary artery disease 11/24/2014   Syncope 10/22/2014   Hyperglycemia 04/20/2014   H/O hiatal hernia 04/19/2014   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    Coronary artery vasospasm ([place]) 09/26/2013   HTN (hypertension) 12/08/2011   GERD (gastroesophageal reflux disease) 12/08/2011   Depression 12/08/2011   Dyslipidemia 12/08/2011     REFERRING PROVIDER: Vanetta Mulders MD  REFERRING DIAG: s/p Rt reverse TSA + biceps tenodesis, without subscap repair    THERAPY DIAG:  Acute pain of right shoulder  Stiffness of right  shoulder, not elsewhere classified  Muscle weakness (generalized)  Rationale for Evaluation and Treatment Rehabilitation  ONSET DATE: DOS 04/07/22  SUBJECTIVE:                                                                                                                                                                                      SUBJECTIVE STATEMENT: Pt is 16 weeks and 2 days s/p R reverse TSA and biceps tenodesis.  Pt states she felt good after prior Rx.  Pt states she is stiff and sore today.  She is not sure if it is due to the cold weather.  Pt is compliant with HEP.       PAIN LEVELS:  3-4/10 in R shoulder and wrist.  Pt has 2/10 pain with ADLs.  FUNCTIONAL IMPROVEMENTS:  Improved pain with ADLs.  reaching, washing face, brushing teeth, cooking, sweeping, dressing.  Pt reports improved reaching behind head to doff shirt. FUNCTIONAL LIMITATIONS:  dressing, bathing, UE elevation, overhead activities, holding objects, lifting objects   PERTINENT HISTORY: H/o bil RCR R carpal tunnel release in July 2023   PRECAUTIONS: Shoulder, reverse TSA without subscap repair  WEIGHT BEARING RESTRICTIONS Yes    FALLS:  Has patient fallen in last 6 months? No  LIVING ENVIRONMENT: Lives with: lives with their spouse No stairs  OCCUPATION: Not working  PLOF: Independent  PATIENT GOALS the use of my arm and hand back, gardening  OBJECTIVE:    TODAY'S TREATMENT:   Therapeutic Exercise: -Reviewed current function, pain levels, response to prior Rx, and HEP compliance.  -Pt received R shoulder PROM in flexion, abd, scaption, ER, and IR per protocol ranges w/n pt and tissue tolerance. -R shoulder AROM/PROM:  Flex:  128 / 147  Abd:  NT / 98  Scaption:  102 / NT  ER:  50 / 55 deg  Pt performed:  Pulleys x 20 each  in flexion, scaption, and abd  Standing flexion AROM x10 reps  Standing scaption AROM in front of mirror 2x10   Shelf reach x 10 and x 6 reps  Shoulder flexion  seated on bench inclined at 5 2x10 reps  independently  Supine shoulder ABC x 1 rep   Standing rows with scap retraction with RTB 2x10 reps   Standing shoulder extension with retraction with YTB 2x10  Supine serratus punches with 1# 2x10  Standing ER with YTB 2x10      PATIENT EDUCATION: Education details:  POC, HEP, and exercise form/rationale. Person educated: Patient and Spouse Education method: Explanation, Demonstration, Tactile cues, Verbal cues, and Handouts Education comprehension: verbalized understanding, returned demonstration, verbal cues required, tactile cues required, and needs further education   HOME EXERCISE PROGRAM: [email or web address]     ASSESSMENT:  CLINICAL IMPRESSION: Pt is progressing with UE elevation AROM demonstrating improved flexion and scaption AROM in standing.  Pt demonstrates improved shoulder flexion AROM seated on incline bench including improved control.  Pt is progressing with exercises per protocol.  She performed exercises well without c/o's.  Pt responded well to Rx reporting improved pain to 1/10 after Rx.  She should benefit from cont skilled PT services per protocol to address ongoing goals, improve ROM and strength, and assist in restoring desired level of function.     OBJECTIVE IMPAIRMENTS decreased activity tolerance, decreased knowledge of condition, decreased ROM, decreased strength, increased muscle spasms, impaired flexibility, impaired UE functional use, improper body mechanics, postural dysfunction, and pain.   ACTIVITY LIMITATIONS carrying, lifting, sleeping, bed mobility, bathing, dressing, reach over head, and hygiene/grooming  PARTICIPATION LIMITATIONS: meal prep, cleaning, laundry, driving, shopping, and yard work  PERSONAL FACTORS Past/current experiences and see PMH  are also affecting patient's functional outcome.   REHAB POTENTIAL: Good  CLINICAL DECISION MAKING: Stable/uncomplicated  EVALUATION COMPLEXITY:  Low   GOALS:  SHORT TERM GOALS: Target date: 05/09/2022   AROM flexion to 90 without incr pain and good scap control Baseline:see chart Goal status: met in supine, not met in standing  2.  ER in scapular plane to 30 deg Baseline: see chart Goal status: GOAL MET  3.  Pain levels <=3/10 Baseline: see subj reports Goal status:  ONGOING  4.  D/c sling Baseline: as tolerated at 3 wk Goal status: GOAL MET   LONG TERM GOALS: Target date: 08/20/2022     1.  Pt will meet FOTO goal Baseline: see above Goal status: ONGOING Target date: 06/20/22  2.  AROM flexion to 130 without incr pain and good scap control Baseline:  Goal status:  PARTIALLY MET Target date: 05/23/2022   3.  Use of arm for ADLs pain <=3/10 Baseline:  Goal status: GOAL MET Target date: 05/23/2022   4.  Initiate functional IR behind back without incr pain Baseline: can begin at 6 weeks per post op protocol Goal status: GOAL MET Target date: 05/23/2022   5.  Pt will demo R shoulder AROM to be [facility] t/o for performance of ADLs and IADLs.  Goal status: PROGRESSING Target date: 08/01/22  6.  Pt will be able to perform her ADLs and IADLs including reaching activities without significant pain and difficulty.  Goal status: PROGRESSING Target date: 08/01/22  7.  Pt will be able to reach into an overhead cabinet without significant difficulty.  Goal status: ONGOING Target date: 08/01/22    PLAN: PT FREQUENCY: 2x/wk   PT DURATION: 6 weeks  PLANNED INTERVENTIONS: Therapeutic exercises, Therapeutic activity, Neuromuscular  re-education, Balance training, Gait training, Patient/Family education, Self Care, Joint mobilization, Aquatic Therapy, Dry Needling, Electrical stimulation, Spinal mobilization, Cryotherapy, Moist heat, scar mobilization, Taping, Vasopneumatic device, Manual therapy, and Re-evaluation  PLAN FOR NEXT SESSION: continue per Dr. Eddie Dibbles reverse TSA protocol.    Selinda Michaels III PT,  DPT 07/31/22 2:55 PM

## 2022-08-01 ENCOUNTER — Encounter (HOSPITAL_BASED_OUTPATIENT_CLINIC_OR_DEPARTMENT_OTHER): Payer: Self-pay | Admitting: Physical Therapy

## 2022-08-01 ENCOUNTER — Ambulatory Visit (HOSPITAL_BASED_OUTPATIENT_CLINIC_OR_DEPARTMENT_OTHER): Payer: Medicare Other | Admitting: Physical Therapy

## 2022-08-01 DIAGNOSIS — M25511 Pain in right shoulder: Secondary | ICD-10-CM | POA: Diagnosis not present

## 2022-08-01 DIAGNOSIS — M25611 Stiffness of right shoulder, not elsewhere classified: Secondary | ICD-10-CM

## 2022-08-01 DIAGNOSIS — M6281 Muscle weakness (generalized): Secondary | ICD-10-CM

## 2022-08-01 NOTE — Therapy (Signed)
OUTPATIENT PHYSICAL THERAPY SHOULDER TREATMENT      Patient Name: [name] MRN: [id number] DOB:Aug 23, 1945, 76 y.o., female Today's Date: 08/01/2022   PT End of Session - 08/01/22 1111     Visit Number 27    Number of Visits 32    Date for PT Re-Evaluation 08/20/22    Authorization Type UHC MCR    Progress Note Due on Visit 30    PT Start Time 1019    PT Stop Time 1102    PT Time Calculation (min) 43 min    Activity Tolerance Patient tolerated treatment well    Behavior During Therapy WFL for tasks assessed/performed                  Past Medical History:  Diagnosis Date   Anemia    Anginal pain ([place])    last cp was last year   Anxiety    Arthritis    Blood transfusion    Chest pain 02/04/2016   Coronary artery disease    Coronary artery spasm, wth continued episodes of chest pain.  09/26/2013   Coronary vasospasm ([facility])    Diabetes mellitus without complication (HCC)    type II   GERD (gastroesophageal reflux disease)    Headache    Hiatal hernia    Hypercholesteremia    Hypertension    NSTEMI (non-ST elevated myocardial infarction) ([place]) not sure   NSVT (nonsustained ventricular tachycardia) ([place]) 09/26/2013   Panic attack    Past Surgical History:  Procedure Laterality Date   ABDOMINAL HYSTERECTOMY     PARTIAL HYSTERECTOMY   ACDF N/A    C5-6 ACDF Dr. Sherwood Gambler   breast     right, tumor benign   BREAST EXCISIONAL BIOPSY Right    1961 (age 69) benign   CARDIAC CATHETERIZATION  2014   CARDIAC CATHETERIZATION  2012   CARDIAC CATHETERIZATION N/A 07/22/2016   Procedure: Left Heart Cath and Coronary Angiography;  Surgeon: Belva Crome, MD;  Location: [facility];  Service: Cardiovascular;  Laterality: N/A;   cardiac stent  2012   to RCA   CHOLECYSTECTOMY N/A 10/02/2017   Procedure: LAPAROSCOPIC CHOLECYSTECTOMY;  Surgeon: Ralene Ok, MD;  Location: [facility];  Service: General;  Laterality: N/A;   CORONARY ANGIOPLASTY     ESOPHAGEAL  MANOMETRY N/A 10/29/2016   Procedure: ESOPHAGEAL MANOMETRY (EM);  Surgeon: Garlan Fair, MD;  [facility];  Service: Endoscopy;  Laterality: N/A;   ESOPHAGOGASTRODUODENOSCOPY (EGD) WITH PROPOFOL N/A 10/28/2016   Procedure: ESOPHAGOGASTRODUODENOSCOPY (EGD) WITH PROPOFOL;  Surgeon: Garlan Fair, MD;  [facility];  Service: Endoscopy;  Laterality: N/A;   EXCISION OF SKIN TAG N/A 07/29/2017   Procedure: EXCISION OF PERIANAL  SKIN TAG;  Surgeon: Ralene Ok, MD;  Location: [facility];  Service: General;  Laterality: N/A;   KNEE SURGERY  left   left ear surgery     for bad cut   LEFT HEART CATHETERIZATION WITH CORONARY ANGIOGRAM N/A 02/22/2013   Procedure: LEFT HEART CATHETERIZATION WITH CORONARY ANGIOGRAM;  Surgeon: [name], MD;  Location: [facility];  Service: Cardiovascular;  Laterality: N/A;   REVERSE SHOULDER ARTHROPLASTY Right 04/07/2022   Procedure: RIGHT SHOULDER REVERSE SHOULDER ARTHROPLASTY;  Surgeon: Vanetta Mulders, MD;  Location: [place];  Service: Orthopedics;  Laterality: Right;   SHOULDER SURGERY Bilateral    rotator cuff   TOE SURGERY Left    toe surgery   Patient Active Problem List   Diagnosis Date  Noted   Rotator cuff arthropathy of right shoulder    Carpal tunnel syndrome, right upper limb 01/03/2022   Chest pain 06/21/2021   Variant angina ([place]) 01/24/2017   Precordial chest pain 01/23/2017   Headache 01/23/2017   S/P angioplasty with stent 01/23/2017   Coronary artery disease 11/24/2014   Syncope 10/22/2014   Hyperglycemia 04/20/2014   H/O hiatal hernia 04/19/2014   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    Coronary artery vasospasm ([place]) 09/26/2013   HTN (hypertension) 12/08/2011   GERD (gastroesophageal reflux disease) 12/08/2011   Depression 12/08/2011   Dyslipidemia 12/08/2011     REFERRING PROVIDER: Vanetta Mulders MD  REFERRING DIAG: s/p Rt reverse TSA + biceps tenodesis, without subscap repair    THERAPY DIAG:  Acute pain of right  shoulder  Stiffness of right shoulder, not elsewhere classified  Muscle weakness (generalized)  Rationale for Evaluation and Treatment Rehabilitation  ONSET DATE: DOS 04/07/22  SUBJECTIVE:                                                                                                                                                                                      SUBJECTIVE STATEMENT: Pt is 16 weeks and 4 days s/p R reverse TSA and biceps tenodesis.  Pt states she felt good after prior Rx.  Pt reports improved stiffness today but is still sore and hurting more today.  Pt has difficulty with dressing though states donning clothes have improved.  Pt continues to be limited with reaching and UE elevation though reports improvement.        Pt had carpal tunnel release surgery in July.  She has occasional sharp pain in palm of R hand.  Pt c/o's of pain in R wrist today.  She continues to perform wrist AROM and putty gripping at home.   PAIN LEVELS:  4/10 in R shoulder and wrist.  Pt has 2/10 pain with ADLs though can be 3/10 at times  PERTINENT HISTORY: H/o bil RCR R carpal tunnel release in July 2023   PRECAUTIONS: Shoulder, reverse TSA without subscap repair  WEIGHT BEARING RESTRICTIONS Yes    FALLS:  Has patient fallen in last 6 months? No  LIVING ENVIRONMENT: Lives with: lives with their spouse No stairs  OCCUPATION: Not working  PLOF: Independent  PATIENT GOALS the use of my arm and hand back, gardening  OBJECTIVE:    TODAY'S TREATMENT:   Therapeutic Exercise: -Reviewed current function, pain levels, response to prior Rx, and HEP compliance.  -Pt received R shoulder PROM in flexion, abd, scaption, ER, and IR per protocol ranges w/n pt and tissue tolerance.  Pt performed:  Pulleys x 20 each in flexion,  scaption, and abd  Standing scaption AROM in front of mirror 2x10   Shelf reach x 10 and x 6 reps  Shoulder flexion seated on bench inclined at 5/4 x10/2x10  reps independently  Supine shoulder ABC x 1 rep   Standing rows with scap retraction with GTB 2x10 reps   Standing shoulder extension with retraction with RTB 2x10  Supine serratus punches with 1# 2x10  Standing ER with YTB 2x10  Reviewed and updated HEP.  Pt received a HEP handout and was educated in correct form and appropriate frequency.       PATIENT EDUCATION: Education details:  POC, HEP, and exercise form/rationale. Person educated: Patient and Spouse Education method: Explanation, Demonstration, Tactile cues, Verbal cues, and Handouts Education comprehension: verbalized understanding, returned demonstration, verbal cues required, tactile cues required, and needs further education   HOME EXERCISE PROGRAM: [id number]   Updated HEP: - Standing Shoulder Row with Anchored Resistance  - 1 x daily - 3-4 x weekly - 2-3 sets - 10 reps - Shoulder extension with resistance - Neutral  - 1 x daily - 3-4 x weekly - 2-3 sets - 10 reps - Shoulder External Rotation with Anchored Resistance  - 1 x daily - 3 x weekly - 2-3 sets - 10 reps    ASSESSMENT:  CLINICAL IMPRESSION: Pt is slowly progressing with functional usage of R UE as evidenced by subjective reports.  Pt is progressing with R shoulder AROM and PROM.  PT increased the height of the inclined bench with flexion AROM and Pt performed well.  Pt was unable to perform scaption seated on incline bench though is able to perform standing scaption.  PT increased resistance with standing scapular theraband exercises and pt performed well.  Pt demonstrates improved form with standing ER.  PT reviewed HEP and updated HEP.  Pt demonstrates good understanding of HEP.  Pt responded well to Rx having no c/o's after Rx.  She should benefit from cont skilled PT services per protocol to address ongoing goals, improve ROM and strength, and assist in restoring desired level of function.     OBJECTIVE IMPAIRMENTS decreased activity tolerance, decreased  knowledge of condition, decreased ROM, decreased strength, increased muscle spasms, impaired flexibility, impaired UE functional use, improper body mechanics, postural dysfunction, and pain.   ACTIVITY LIMITATIONS carrying, lifting, sleeping, bed mobility, bathing, dressing, reach over head, and hygiene/grooming  PARTICIPATION LIMITATIONS: meal prep, cleaning, laundry, driving, shopping, and yard work  PERSONAL FACTORS Past/current experiences and see PMH  are also affecting patient's functional outcome.   REHAB POTENTIAL: Good  CLINICAL DECISION MAKING: Stable/uncomplicated  EVALUATION COMPLEXITY: Low   GOALS:  SHORT TERM GOALS: Target date: 05/09/2022   AROM flexion to 90 without incr pain and good scap control Baseline:see chart Goal status: met in supine, not met in standing  2.  ER in scapular plane to 30 deg Baseline: see chart Goal status: GOAL MET  3.  Pain levels <=3/10 Baseline: see subj reports Goal status:  ONGOING  4.  D/c sling Baseline: as tolerated at 3 wk Goal status: GOAL MET   LONG TERM GOALS: Target date: 08/20/2022     1.  Pt will meet FOTO goal Baseline: see above Goal status: ONGOING Target date: 06/20/22  2.  AROM flexion to 130 without incr pain and good scap control Baseline:  Goal status:  PARTIALLY MET Target date: 05/23/2022   3.  Use of arm for ADLs pain <=3/10 Baseline:  Goal status: GOAL MET Target date:  05/23/2022   4.  Initiate functional IR behind back without incr pain Baseline: can begin at 6 weeks per post op protocol Goal status: GOAL MET Target date: 05/23/2022   5.  Pt will demo R shoulder AROM to be [facility] t/o for performance of ADLs and IADLs.  Goal status: PROGRESSING Target date: 08/01/22  6.  Pt will be able to perform her ADLs and IADLs including reaching activities without significant pain and difficulty.  Goal status: PROGRESSING Target date: 08/01/22  7.  Pt will be able to reach into an overhead cabinet  without significant difficulty.  Goal status: ONGOING Target date: 08/01/22    PLAN: PT FREQUENCY: 2x/wk   PT DURATION: 6 weeks  PLANNED INTERVENTIONS: Therapeutic exercises, Therapeutic activity, Neuromuscular re-education, Balance training, Gait training, Patient/Family education, Self Care, Joint mobilization, Aquatic Therapy, Dry Needling, Electrical stimulation, Spinal mobilization, Cryotherapy, Moist heat, scar mobilization, Taping, Vasopneumatic device, Manual therapy, and Re-evaluation  PLAN FOR NEXT SESSION: continue per Dr. Eddie Dibbles reverse TSA protocol.   Selinda Michaels III PT, DPT 08/01/22 11:45 AM

## 2022-08-06 ENCOUNTER — Encounter (HOSPITAL_BASED_OUTPATIENT_CLINIC_OR_DEPARTMENT_OTHER): Payer: Self-pay | Admitting: Physical Therapy

## 2022-08-06 ENCOUNTER — Ambulatory Visit (HOSPITAL_BASED_OUTPATIENT_CLINIC_OR_DEPARTMENT_OTHER): Payer: Medicare Other | Admitting: Physical Therapy

## 2022-08-06 ENCOUNTER — Ambulatory Visit (INDEPENDENT_AMBULATORY_CARE_PROVIDER_SITE_OTHER): Payer: Medicare Other

## 2022-08-06 ENCOUNTER — Ambulatory Visit (INDEPENDENT_AMBULATORY_CARE_PROVIDER_SITE_OTHER): Payer: Medicare Other | Admitting: Orthopaedic Surgery

## 2022-08-06 DIAGNOSIS — M6281 Muscle weakness (generalized): Secondary | ICD-10-CM

## 2022-08-06 DIAGNOSIS — Z9889 Other specified postprocedural states: Secondary | ICD-10-CM

## 2022-08-06 DIAGNOSIS — M25511 Pain in right shoulder: Secondary | ICD-10-CM | POA: Diagnosis not present

## 2022-08-06 DIAGNOSIS — M25611 Stiffness of right shoulder, not elsewhere classified: Secondary | ICD-10-CM

## 2022-08-06 DIAGNOSIS — Z96611 Presence of right artificial shoulder joint: Secondary | ICD-10-CM | POA: Diagnosis not present

## 2022-08-06 DIAGNOSIS — Z471 Aftercare following joint replacement surgery: Secondary | ICD-10-CM | POA: Diagnosis not present

## 2022-08-06 NOTE — Progress Notes (Signed)
Post Operative Evaluation    Procedure/Date of Surgery: Right shoulder reverse shoulder arthroplasty 04/07/22  Interval History:    Presents today 4 months status post right shoulder reverse shoulder arthroplasty.  Overall she is continuing to improve.  She is working on Hotel manager.  She is experiencing some soreness in the biceps and some stiffness in the fingers at night.  PMH/PSH/Family History/Social History/Meds/Allergies:    Past Medical History:  Diagnosis Date   Anemia    Anginal pain ([place])    last cp was last year   Anxiety    Arthritis    Blood transfusion    Chest pain 02/04/2016   Coronary artery disease    Coronary artery spasm, wth continued episodes of chest pain.  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 Sherwood Gambler   breast     right, tumor benign   BREAST EXCISIONAL BIOPSY Right    1961 (age 53) benign   CARDIAC CATHETERIZATION  2014   CARDIAC CATHETERIZATION  2012   CARDIAC CATHETERIZATION N/A 07/22/2016   Procedure: Left Heart Cath and Coronary Angiography;  Surgeon: Belva Crome, MD;  Location: [place] [facility];  Service: Cardiovascular;  Laterality: N/A;   cardiac stent  2012   to RCA   CHOLECYSTECTOMY N/A 10/02/2017   Procedure: LAPAROSCOPIC CHOLECYSTECTOMY;  Surgeon: Ralene Ok, MD;  Location: [place];  Service: General;  Laterality: N/A;   CORONARY ANGIOPLASTY     ESOPHAGEAL MANOMETRY N/A 10/29/2016   Procedure: ESOPHAGEAL MANOMETRY (EM);  Surgeon: Garlan Fair,  MD;  [facility]: [facility];  Service: Endoscopy;  Laterality: N/A;   ESOPHAGOGASTRODUODENOSCOPY (EGD) WITH PROPOFOL N/A 10/28/2016   Procedure: ESOPHAGOGASTRODUODENOSCOPY (EGD) WITH PROPOFOL;  Surgeon: Garlan Fair, MD;  Location: [facility];  Service: Endoscopy;  Laterality: N/A;   EXCISION OF SKIN TAG N/A 07/29/2017   Procedure: EXCISION OF PERIANAL  SKIN TAG;  Surgeon: Ralene Ok, MD;  Location: [place];  Service: General;  Laterality: N/A;   KNEE SURGERY  left   left ear surgery     for bad cut   LEFT HEART CATHETERIZATION WITH CORONARY ANGIOGRAM N/A 02/22/2013   Procedure: LEFT HEART CATHETERIZATION WITH CORONARY ANGIOGRAM;  Surgeon: [name], MD;  Location: [facility];  Service: Cardiovascular;  Laterality: N/A;   REVERSE SHOULDER ARTHROPLASTY Right 04/07/2022   Procedure: RIGHT SHOULDER REVERSE SHOULDER ARTHROPLASTY;  Surgeon: Vanetta Mulders, MD;  Location: [place];  Service: Orthopedics;  Laterality: Right;   SHOULDER SURGERY Bilateral    rotator cuff   TOE SURGERY Left    toe surgery   Social History   Socioeconomic History   Marital status: Married    Spouse name: [name]   Number of children: 0   Years of education: Not on file   Highest education level: Not  on file  Occupational History   Not on file  Tobacco Use   Smoking status: Every Day    Packs/day: 0.50    Years: 40.00    Total pack years: 20.00    Types: Cigarettes    Last attempt to quit: 01/10/2016    Years since quitting: 6.5   Smokeless tobacco: Never   Tobacco comments:    Pt reports she smokes a pack or less per week of cigarettes  Vaping Use   Vaping Use: Never used  Substance and Sexual Activity   Alcohol use: Yes    Alcohol/week: 1.0 standard drink of alcohol    Types: 1 Cans of beer per week    Comment: occasional beer   Drug use: No   Sexual activity: Never  Other Topics Concern   Not on file  Social History Narrative   Lives with husband. Ambulates independently.    Social Determinants of Health   Financial Resource Strain: Not on file  Food Insecurity: No Food Insecurity (03/24/2022)   Hunger Vital Sign    Worried About Running Out of Food in the Last Year: Never true    Ran Out of Food in the Last Year: Never true  Transportation Needs: No Transportation Needs (03/24/2022)   PRAPARE - Hydrologist (Medical): No    Lack of Transportation (Non-Medical): No  Physical Activity: Not on file  Stress: Not on file  Social Connections: Not on file   Family History  Problem Relation Age of Onset   Heart failure Mother    Stroke Sister    Heart attack Other    No Known Allergies Current Outpatient Medications  Medication Sig Dispense Refill   ACCU-CHEK GUIDE test strip USE TO CHECK BLOOD SUGAR DAILY     Accu-Chek Softclix Lancets lancets daily.     amLODipine (NORVASC) 2.5 MG tablet Take 1 tablet (2.5 mg total) by mouth daily. 30 tablet 1   aspirin EC 325 MG tablet Take 1 tablet (325 mg total) by mouth daily. 30 tablet 0   buPROPion (ZYBAN) 150 MG 12 hr tablet Take 150 mg by mouth every morning.     dapagliflozin propanediol (FARXIGA) 10 MG TABS tablet Take 1 tablet (10 mg total) by mouth daily before breakfast. 30 tablet 11   HYDROcodone-acetaminophen (NORCO/VICODIN) 5-325 MG tablet Take 1 tablet by mouth every 6 (six) hours as needed for moderate pain. 30 tablet 0   HYDROcodone-acetaminophen (NORCO/VICODIN) 5-325 MG tablet Take 1 tablet by mouth every 6 (six) hours as needed for moderate pain. 30 tablet 0   metFORMIN (GLUCOPHAGE-XR) 500 MG 24 hr tablet Take 500 mg by mouth 2 (two) times daily.     methocarbamol (ROBAXIN) 500 MG tablet Take 1 tablet (500 mg total) by mouth 4 (four) times daily. 30 tablet 3   methocarbamol (ROBAXIN) 500 MG tablet Take 1 tablet (500 mg total) by mouth 4 (four) times daily. 30 tablet 3   nitroGLYCERIN (NITROSTAT) 0.4 MG SL tablet PLACE 1 TABLET UNDER TONGUE AS NEEDED FOR CHEST PAIN EVERY 5  MINUTES UP TO 3 DOSES THEN SEEK MEDICAL ATTENTION. Please make overdue appt. 1st attempt 25 tablet 0   ondansetron (ZOFRAN-ODT) 8 MG disintegrating tablet Take 1 tablet (8 mg total) by mouth every 8 (eight) hours as needed for nausea or vomiting. 10 tablet 0   oxyCODONE (ROXICODONE) 5 MG immediate release tablet Take 1 tablet (5 mg total) by mouth every 4 (four) hours as needed for  severe pain or breakthrough pain. 30 tablet 0   pantoprazole (PROTONIX) 40 MG tablet Take 1 tablet (40 mg total) by mouth 2 (two) times daily. 60 tablet 2   pregabalin (LYRICA) 50 MG capsule Take 1 cap by mouth at night for 7 nights then take 1 cap by mouth in the morning and 1 at night. (Patient not taking: Reported on 03/17/2022) 30 capsule 1   ranolazine (RANEXA) 500 MG 12 hr tablet TAKE 1 TABLET(500 MG) BY MOUTH TWICE DAILY (Patient not taking: Reported on 03/17/2022) 60 tablet 11   rosuvastatin (CRESTOR) 5 MG tablet Take 1 tablet (5 mg total) by mouth daily at 6 PM. 30 tablet 1   senna-docusate (SENOKOT-S) 8.6-50 MG tablet Take 1 tablet by mouth at bedtime as needed for mild constipation or moderate constipation. (Patient not taking: Reported on 03/17/2022) 20 tablet 0   traZODone (DESYREL) 50 MG tablet Take 100 mg by mouth at bedtime as needed for sleep.     No current facility-administered medications for this visit.   No results found.  Review of Systems:   A ROS was performed including pertinent positives and negatives as documented in the HPI.   Musculoskeletal Exam:    There were no vitals taken for this visit.  Right incision is well-healed.  In the sitting position she is able to forward elevate to 130 degrees and external rotation at the side is to 45 degrees.   Able to flex and extend at the right elbow.  Internal rotation is to back pocket.  Sensation is intact distally with 2+ radial pulse  Imaging:    3 views right shoulder: Status post right shoulder reverse shoulder arthroplasty without evidence of  hardware complication  I personally reviewed and interpreted the radiographs.   Assessment:   76 year old female 63-monthstatus post right reverse shoulder arthroplasty overall doing very well.  At this time she is continue to progress in strengthening and range of motion.  I am satisfied with her progress.  I will plan to see her back in 3 months Plan :    -Return to clinic in 12 weeks      I personally saw and evaluated the patient, and participated in the management and treatment plan.  SVanetta Mulders MD Attending Physician, Orthopedic Surgery  This document was dictated using Dragon voice recognition software. A reasonable attempt at proof reading has been made to minimize errors.

## 2022-08-06 NOTE — Therapy (Signed)
OUTPATIENT PHYSICAL THERAPY SHOULDER TREATMENT      Patient Name: [name] MRN: [id number] DOB:24-May-1946, 76 y.o., female Today's Date: 08/06/2022   PT End of Session - 08/06/22 1056     Visit Number 28    Number of Visits 32    Date for PT Re-Evaluation 08/20/22    Authorization Type [employer] MCR    Progress Note Due on Visit 30    PT Start Time 1021    PT Stop Time 1104    PT Time Calculation (min) 43 min    Activity Tolerance Patient tolerated treatment well    Behavior During Therapy WFL for tasks assessed/performed                   Past Medical History:  Diagnosis Date   Anemia    Anginal pain ([place])    last cp was last year   Anxiety    Arthritis    Blood transfusion    Chest pain 02/04/2016   Coronary artery disease    Coronary artery spasm, wth continued episodes of chest pain.  09/26/2013   Coronary vasospasm (HCC)    Diabetes mellitus without complication (HCC)    type II   GERD (gastroesophageal reflux disease)    Headache    Hiatal hernia    Hypercholesteremia    Hypertension    NSTEMI (non-ST elevated myocardial infarction) ([place]) not sure   NSVT (nonsustained ventricular tachycardia) ([place]) 09/26/2013   Panic attack    Past Surgical History:  Procedure Laterality Date   ABDOMINAL HYSTERECTOMY     PARTIAL HYSTERECTOMY   ACDF N/A    C5-6 ACDF Dr. Sherwood Gambler   breast     right, tumor benign   BREAST EXCISIONAL BIOPSY Right    1961 (age 74) benign   CARDIAC CATHETERIZATION  2014   CARDIAC CATHETERIZATION  2012   CARDIAC CATHETERIZATION N/A 07/22/2016   Procedure: Left Heart Cath and Coronary Angiography;  Surgeon: Belva Crome, MD;  Location: [place] [facility];  Service: Cardiovascular;  Laterality: N/A;   cardiac stent  2012   to RCA   CHOLECYSTECTOMY N/A 10/02/2017   Procedure: LAPAROSCOPIC CHOLECYSTECTOMY;  Surgeon: Ralene Ok, MD;  Location: [place];  Service: General;  Laterality: N/A;   CORONARY ANGIOPLASTY     ESOPHAGEAL  MANOMETRY N/A 10/29/2016   Procedure: ESOPHAGEAL MANOMETRY (EM);  Surgeon: Garlan Fair, MD;  [facility];  Service: Endoscopy;  Laterality: N/A;   ESOPHAGOGASTRODUODENOSCOPY (EGD) WITH PROPOFOL N/A 10/28/2016   Procedure: ESOPHAGOGASTRODUODENOSCOPY (EGD) WITH PROPOFOL;  Surgeon: Garlan Fair, MD;  [facility];  Service: Endoscopy;  Laterality: N/A;   EXCISION OF SKIN TAG N/A 07/29/2017   Procedure: EXCISION OF PERIANAL  SKIN TAG;  Surgeon: Ralene Ok, MD;  Location: [place];  Service: General;  Laterality: N/A;   KNEE SURGERY  left   left ear surgery     for bad cut   LEFT HEART CATHETERIZATION WITH CORONARY ANGIOGRAM N/A 02/22/2013   Procedure: LEFT HEART CATHETERIZATION WITH CORONARY ANGIOGRAM;  Surgeon: [name], MD;  [facility];  Service: Cardiovascular;  Laterality: N/A;   REVERSE SHOULDER ARTHROPLASTY Right 04/07/2022   Procedure: RIGHT SHOULDER REVERSE SHOULDER ARTHROPLASTY;  Surgeon: Vanetta Mulders, MD;  Location: [place];  Service: Orthopedics;  Laterality: Right;   SHOULDER SURGERY Bilateral    rotator cuff   TOE SURGERY Left    toe surgery   Patient Active Problem List   Diagnosis  Date Noted   Rotator cuff arthropathy of right shoulder    Carpal tunnel syndrome, right upper limb 01/03/2022   Chest pain 06/21/2021   Variant angina ([place]) 01/24/2017   Precordial chest pain 01/23/2017   Headache 01/23/2017   S/P angioplasty with stent 01/23/2017   Coronary artery disease 11/24/2014   Syncope 10/22/2014   Hyperglycemia 04/20/2014   H/O hiatal hernia 04/19/2014   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    Coronary artery vasospasm ([place]) 09/26/2013   HTN (hypertension) 12/08/2011   GERD (gastroesophageal reflux disease) 12/08/2011   Depression 12/08/2011   Dyslipidemia 12/08/2011     REFERRING PROVIDER: Vanetta Mulders MD  REFERRING DIAG: s/p Rt reverse TSA + biceps tenodesis, without subscap repair    THERAPY DIAG:  Acute pain of right  shoulder  Stiffness of right shoulder, not elsewhere classified  Muscle weakness (generalized)  Rationale for Evaluation and Treatment Rehabilitation  ONSET DATE: DOS 04/07/22  SUBJECTIVE:                                                                                                                                                                                      SUBJECTIVE STATEMENT: Pt is 17 weeks and 2 days s/p R reverse TSA and biceps tenodesis.  Pt saw MD this AM who was pleased with her progress.  Pt informed MD about the soreness/stiffness in her shoulder and her pain/stiffness in wrist and she states he told her that was normal.  Pt states her shoulder is not as stiff today, but is sore.  Pt denies any adverse effects after prior Rx.  Pt continues to be limited with reaching and UE elevation though reports improvement.        Pt had carpal tunnel release surgery in July.  She continues to have wrist pain and has occasional sharp pain in palm of R hand.  She continues to perform wrist AROM and putty gripping at home.   PAIN LEVELS:  2/10 in R shoulder and R wrist.  Pt has 2/10 pain with ADLs though can be 3/10 at times  PERTINENT HISTORY: H/o bil RCR R carpal tunnel release in July 2023   PRECAUTIONS: Shoulder, reverse TSA without subscap repair  WEIGHT BEARING RESTRICTIONS Yes    FALLS:  Has patient fallen in last 6 months? No  LIVING ENVIRONMENT: Lives with: lives with their spouse No stairs  OCCUPATION: Not working  PLOF: Independent  PATIENT GOALS the use of my arm and hand back, gardening  OBJECTIVE:    TODAY'S TREATMENT:   Therapeutic Exercise: -Reviewed pain levels, response to prior Rx, and HEP compliance.  -Pt received R shoulder PROM in flexion, abd, scaption, ER,  and IR per protocol ranges w/n pt and tissue tolerance.  Pt performed:  Pulleys x 20 each in flexion, scaption, and abd  Standing scaption AROM in front of mirror 3x10   Shelf reach  2 x 10 reps  Shoulder flexion seated on bench inclined at 4/3 x15/2x10 reps independently  Supine shoulder ABC x 1 rep with 1#  Standing rows with scap retraction with GTB 2x10 reps   Standing shoulder extension with retraction with RTB 2x10  Supine serratus punches with 1# x10 and 2# x10  Standing ER with YTB 2x10  S/L ER with 1# 2x10  Prone row with 1# 2x10  Supine rhythmic stab's at 90 deg 2x30 sec, 60 deg x 30 sec   PATIENT EDUCATION: Education details:  POC, HEP, and exercise form/rationale.  PT instructed pt to ice shoulder if she has soreness or pain later.  Person educated: Patient and Spouse Education method: Explanation, Demonstration, Tactile cues, Verbal cues, and Handouts Education comprehension: verbalized understanding, returned demonstration, verbal cues required, tactile cues required, and needs further education   HOME EXERCISE PROGRAM: [id number]     ASSESSMENT:  CLINICAL IMPRESSION: Pt is progressing with R shoulder AROM and R shoulder strength.  PT increased the height of the inclined bench with flexion AROM and Pt performed well independently.  PT increased intensity of exercises by adding resistance to select exercises and pt tolerated progression well.  Pt able to perform 2 complete sets of shoulder reach today.  She responded well to Rx having no c/o's after Rx.  She should benefit from cont skilled PT services per protocol to address ongoing goals, improve ROM and strength, and assist in restoring desired level of function.     OBJECTIVE IMPAIRMENTS decreased activity tolerance, decreased knowledge of condition, decreased ROM, decreased strength, increased muscle spasms, impaired flexibility, impaired UE functional use, improper body mechanics, postural dysfunction, and pain.   ACTIVITY LIMITATIONS carrying, lifting, sleeping, bed mobility, bathing, dressing, reach over head, and hygiene/grooming  PARTICIPATION LIMITATIONS: meal prep, cleaning, laundry,  driving, shopping, and yard work  PERSONAL FACTORS Past/current experiences and see PMH  are also affecting patient's functional outcome.   REHAB POTENTIAL: Good  CLINICAL DECISION MAKING: Stable/uncomplicated  EVALUATION COMPLEXITY: Low   GOALS:  SHORT TERM GOALS: Target date: 05/09/2022   AROM flexion to 90 without incr pain and good scap control Baseline:see chart Goal status: met in supine, not met in standing  2.  ER in scapular plane to 30 deg Baseline: see chart Goal status: GOAL MET  3.  Pain levels <=3/10 Baseline: see subj reports Goal status:  ONGOING  4.  D/c sling Baseline: as tolerated at 3 wk Goal status: GOAL MET   LONG TERM GOALS: Target date: 08/20/2022     1.  Pt will meet FOTO goal Baseline: see above Goal status: ONGOING Target date: 06/20/22  2.  AROM flexion to 130 without incr pain and good scap control Baseline:  Goal status:  PARTIALLY MET Target date: 05/23/2022   3.  Use of arm for ADLs pain <=3/10 Baseline:  Goal status: GOAL MET Target date: 05/23/2022   4.  Initiate functional IR behind back without incr pain Baseline: can begin at 6 weeks per post op protocol Goal status: GOAL MET Target date: 05/23/2022   5.  Pt will demo R shoulder AROM to be [facility] t/o for performance of ADLs and IADLs.  Goal status: PROGRESSING Target date: 08/01/22  6.  Pt will be able to perform her  ADLs and IADLs including reaching activities without significant pain and difficulty.  Goal status: PROGRESSING Target date: 08/01/22  7.  Pt will be able to reach into an overhead cabinet without significant difficulty.  Goal status: ONGOING Target date: 08/01/22    PLAN: PT FREQUENCY: 2x/wk   PT DURATION: 6 weeks  PLANNED INTERVENTIONS: Therapeutic exercises, Therapeutic activity, Neuromuscular re-education, Balance training, Gait training, Patient/Family education, Self Care, Joint mobilization, Aquatic Therapy, Dry Needling, Electrical  stimulation, Spinal mobilization, Cryotherapy, Moist heat, scar mobilization, Taping, Vasopneumatic device, Manual therapy, and Re-evaluation  PLAN FOR NEXT SESSION: continue per Dr. Eddie Dibbles reverse TSA protocol.   Selinda Michaels III PT, DPT 08/06/22 10:54 PM

## 2022-08-07 NOTE — Therapy (Signed)
OUTPATIENT PHYSICAL THERAPY SHOULDER TREATMENT      Patient Name: [name] MRN: [id number] DOB:Dec 02, 1945, 77 y.o., female Today's Date: 08/08/2022   PT End of Session - 08/08/22 1027     Visit Number 29    Number of Visits 32    Date for PT Re-Evaluation 08/20/22    Authorization Type U[employer] MCR    PT Start Time 1020    PT Stop Time 1105    PT Time Calculation (min) 45 min    Activity Tolerance Patient tolerated treatment well    Behavior During Therapy WFL for tasks assessed/performed                    Past Medical History:  Diagnosis Date   Anemia    Anginal pain ([place])    last cp was last year   Anxiety    Arthritis    Blood transfusion    Chest pain 02/04/2016   Coronary artery disease    Coronary artery spasm, wth continued episodes of chest pain.  09/26/2013   Coronary vasospasm (HCC)    Diabetes mellitus without complication (HCC)    type II   GERD (gastroesophageal reflux disease)    Headache    Hiatal hernia    Hypercholesteremia    Hypertension    NSTEMI (non-ST elevated myocardial infarction) ([place]) not sure   NSVT (nonsustained ventricular tachycardia) ([place]) 09/26/2013   Panic attack    Past Surgical History:  Procedure Laterality Date   ABDOMINAL HYSTERECTOMY     PARTIAL HYSTERECTOMY   ACDF N/A    C5-6 ACDF Dr. Sherwood Gambler   breast     right, tumor benign   BREAST EXCISIONAL BIOPSY Right    1961 (age 38) benign   CARDIAC CATHETERIZATION  2014   CARDIAC CATHETERIZATION  2012   CARDIAC CATHETERIZATION N/A 07/22/2016   Procedure: Left Heart Cath and Coronary Angiography;  Surgeon: Belva Crome, MD;  Location: [place] [facility];  Service: Cardiovascular;  Laterality: N/A;   cardiac stent  2012   to RCA   CHOLECYSTECTOMY N/A 10/02/2017   Procedure: LAPAROSCOPIC CHOLECYSTECTOMY;  Surgeon: Ralene Ok, MD;  Location: [place];  Service: General;  Laterality: N/A;   CORONARY ANGIOPLASTY     ESOPHAGEAL MANOMETRY N/A 10/29/2016    Procedure: ESOPHAGEAL MANOMETRY (EM);  Surgeon: Garlan Fair, MD;  [facility];  Service: Endoscopy;  Laterality: N/A;   ESOPHAGOGASTRODUODENOSCOPY (EGD) WITH PROPOFOL N/A 10/28/2016   Procedure: ESOPHAGOGASTRODUODENOSCOPY (EGD) WITH PROPOFOL;  Surgeon: Garlan Fair, MD;  [facility];  Service: Endoscopy;  Laterality: N/A;   EXCISION OF SKIN TAG N/A 07/29/2017   Procedure: EXCISION OF PERIANAL  SKIN TAG;  Surgeon: Ralene Ok, MD;  Location: [place];  Service: General;  Laterality: N/A;   KNEE SURGERY  left   left ear surgery     for bad cut   LEFT HEART CATHETERIZATION WITH CORONARY ANGIOGRAM N/A 02/22/2013   Procedure: LEFT HEART CATHETERIZATION WITH CORONARY ANGIOGRAM;  Surgeon: [name], MD;  Location: [facility];  Service: Cardiovascular;  Laterality: N/A;   REVERSE SHOULDER ARTHROPLASTY Right 04/07/2022   Procedure: RIGHT SHOULDER REVERSE SHOULDER ARTHROPLASTY;  Surgeon: Vanetta Mulders, MD;  Location: [place];  Service: Orthopedics;  Laterality: Right;   SHOULDER SURGERY Bilateral    rotator cuff   TOE SURGERY Left    toe surgery   Patient Active Problem List   Diagnosis Date Noted   Rotator cuff arthropathy of  right shoulder    Carpal tunnel syndrome, right upper limb 01/03/2022   Chest pain 06/21/2021   Variant angina ([place]) 01/24/2017   Precordial chest pain 01/23/2017   Headache 01/23/2017   S/P angioplasty with stent 01/23/2017   Coronary artery disease 11/24/2014   Syncope 10/22/2014   Hyperglycemia 04/20/2014   H/O hiatal hernia 04/19/2014   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    Coronary artery vasospasm ([place]) 09/26/2013   HTN (hypertension) 12/08/2011   GERD (gastroesophageal reflux disease) 12/08/2011   Depression 12/08/2011   Dyslipidemia 12/08/2011     REFERRING PROVIDER: Vanetta Mulders MD  REFERRING DIAG: s/p Rt reverse TSA + biceps tenodesis, without subscap repair    THERAPY DIAG:  Acute pain of right shoulder  Stiffness of right  shoulder, not elsewhere classified  Muscle weakness (generalized)  Rationale for Evaluation and Treatment Rehabilitation  ONSET DATE: DOS 04/07/22  SUBJECTIVE:                                                                                                                                                                                      SUBJECTIVE STATEMENT: Pt is 17 weeks and 4 days s/p R reverse TSA and biceps tenodesis.  Pt reports having soreness in R shoulder the following day after prior Rx.  She was unable to perform ER with T band at home the following day.  Pt has minimal difficulty with bathing and dressing    Pt had carpal tunnel release surgery in July.  She continues to have wrist pain and has occasional sharp pain in palm of R hand.    PAIN LEVELS:  2/10 in R shoulder and R wrist.  Pt has 2-3/10 pain with ADLs     PERTINENT HISTORY: H/o bil RCR R carpal tunnel release in July 2023   PRECAUTIONS: Shoulder, reverse TSA without subscap repair  WEIGHT BEARING RESTRICTIONS Yes    FALLS:  Has patient fallen in last 6 months? No  LIVING ENVIRONMENT: Lives with: lives with their spouse No stairs  OCCUPATION: Not working  PLOF: Independent  PATIENT GOALS the use of my arm and hand back, gardening  OBJECTIVE:    TODAY'S TREATMENT:   Therapeutic Exercise: -Reviewed pain levels, response to prior Rx, and HEP compliance.  -Pt received R shoulder PROM in flexion, abd, scaption, ER, and IR per protocol ranges w/n pt and tissue tolerance.  Pt performed:  Standing scaption-Pt tried but unable to perform well    Shelf reach- Pt tried but unable to perform well   Shoulder flexion seated on bench inclined at 3, 3x10 reps independently  standing jobe's flexion 2x10 reps, unable to perform with 1#  Supine shoulder ABC x 1 rep with 1#  Standing rows with scap retraction with GTB 2x10 reps   Standing shoulder extension with retraction with RTB 2x10  Standing ER with  YTB 2x10  Prone row with 1# 2x10  Supine rhythmic stab's at 90 deg 2x30 sec, 60 deg x 30 sec  Standing functional IR AROM 2x10  Standing wand IR 2x10   PATIENT EDUCATION: Education details:  POC, HEP, and exercise form/rationale.  PT instructed pt to ice shoulder if she has soreness or pain later.  Person educated: Patient and Spouse Education method: Explanation, Demonstration, Tactile cues, Verbal cues, and Handouts Education comprehension: verbalized understanding, returned demonstration, verbal cues required, tactile cues required, and needs further education   HOME EXERCISE PROGRAM: [email or web address]     ASSESSMENT:  CLINICAL IMPRESSION: Pt is improving with R shoulder AROM and strength though had increased difficulty with scaption today.  Pt unable to perform standing scaption and shelf reach well today.  PT had pt perform standing jobe's flexion today.  Pt tried a 1# weight though pt unable to perform.  PT removed weight and pt performed actively without resistance.  Pt reports inability to perform standing ER with band at home though had incorrect hand positioning.  Pt required cuing for correct form including hand positioning with standing ER with T band.  Pt was able to perform though did have pain with increased range and PT had pt perform exercise w/n pain free range.  Pt was limited with ER PROM today.  She responded well to Rx stating she felt better after Rx than before Rx.   She should benefit from cont skilled PT services per protocol to address ongoing goals, improve ROM and strength, and assist in restoring desired level of function.    OBJECTIVE IMPAIRMENTS decreased activity tolerance, decreased knowledge of condition, decreased ROM, decreased strength, increased muscle spasms, impaired flexibility, impaired UE functional use, improper body mechanics, postural dysfunction, and pain.   ACTIVITY LIMITATIONS carrying, lifting, sleeping, bed mobility, bathing, dressing, reach over  head, and hygiene/grooming  PARTICIPATION LIMITATIONS: meal prep, cleaning, laundry, driving, shopping, and yard work  PERSONAL FACTORS Past/current experiences and see PMH  are also affecting patient's functional outcome.   REHAB POTENTIAL: Good  CLINICAL DECISION MAKING: Stable/uncomplicated  EVALUATION COMPLEXITY: Low   GOALS:  SHORT TERM GOALS: Target date: 05/09/2022   AROM flexion to 90 without incr pain and good scap control Baseline:see chart Goal status: met in supine, not met in standing  2.  ER in scapular plane to 30 deg Baseline: see chart Goal status: GOAL MET  3.  Pain levels <=3/10 Baseline: see subj reports Goal status:  ONGOING  4.  D/c sling Baseline: as tolerated at 3 wk Goal status: GOAL MET   LONG TERM GOALS: Target date: 08/20/2022     1.  Pt will meet FOTO goal Baseline: see above Goal status: ONGOING Target date: 06/20/22  2.  AROM flexion to 130 without incr pain and good scap control Baseline:  Goal status:  PARTIALLY MET Target date: 05/23/2022   3.  Use of arm for ADLs pain <=3/10 Baseline:  Goal status: GOAL MET Target date: 05/23/2022   4.  Initiate functional IR behind back without incr pain Baseline: can begin at 6 weeks per post op protocol Goal status: GOAL MET Target date: 05/23/2022   5.  Pt will demo R shoulder AROM to be [facility] t/o for performance of ADLs and IADLs.  Goal status: PROGRESSING Target date:  08/01/22  6.  Pt will be able to perform her ADLs and IADLs including reaching activities without significant pain and difficulty.  Goal status: PROGRESSING Target date: 08/01/22  7.  Pt will be able to reach into an overhead cabinet without significant difficulty.  Goal status: ONGOING Target date: 08/01/22    PLAN: PT FREQUENCY: 2x/wk   PT DURATION: 6 weeks  PLANNED INTERVENTIONS: Therapeutic exercises, Therapeutic activity, Neuromuscular re-education, Balance training, Gait training, Patient/Family  education, Self Care, Joint mobilization, Aquatic Therapy, Dry Needling, Electrical stimulation, Spinal mobilization, Cryotherapy, Moist heat, scar mobilization, Taping, Vasopneumatic device, Manual therapy, and Re-evaluation  PLAN FOR NEXT SESSION: continue per Dr. Eddie Dibbles reverse TSA protocol.   Selinda Michaels III PT, DPT 08/08/22 5:52 PM

## 2022-08-08 ENCOUNTER — Encounter (HOSPITAL_BASED_OUTPATIENT_CLINIC_OR_DEPARTMENT_OTHER): Payer: Self-pay | Admitting: Physical Therapy

## 2022-08-08 ENCOUNTER — Ambulatory Visit (HOSPITAL_BASED_OUTPATIENT_CLINIC_OR_DEPARTMENT_OTHER): Payer: Medicare Other | Admitting: Physical Therapy

## 2022-08-08 DIAGNOSIS — M25511 Pain in right shoulder: Secondary | ICD-10-CM

## 2022-08-08 DIAGNOSIS — M6281 Muscle weakness (generalized): Secondary | ICD-10-CM | POA: Diagnosis not present

## 2022-08-08 DIAGNOSIS — M25611 Stiffness of right shoulder, not elsewhere classified: Secondary | ICD-10-CM

## 2022-08-10 DIAGNOSIS — E118 Type 2 diabetes mellitus with unspecified complications: Secondary | ICD-10-CM | POA: Diagnosis not present

## 2022-08-14 ENCOUNTER — Ambulatory Visit (HOSPITAL_BASED_OUTPATIENT_CLINIC_OR_DEPARTMENT_OTHER): Payer: Medicare Other | Attending: Orthopaedic Surgery | Admitting: Physical Therapy

## 2022-08-14 ENCOUNTER — Encounter (HOSPITAL_BASED_OUTPATIENT_CLINIC_OR_DEPARTMENT_OTHER): Payer: Self-pay | Admitting: Physical Therapy

## 2022-08-14 DIAGNOSIS — M25511 Pain in right shoulder: Secondary | ICD-10-CM | POA: Diagnosis not present

## 2022-08-14 DIAGNOSIS — M6281 Muscle weakness (generalized): Secondary | ICD-10-CM

## 2022-08-14 DIAGNOSIS — M25611 Stiffness of right shoulder, not elsewhere classified: Secondary | ICD-10-CM | POA: Diagnosis not present

## 2022-08-14 NOTE — Therapy (Signed)
OUTPATIENT PHYSICAL THERAPY SHOULDER TREATMENT / PROGRESS NOTE   Progress Note Reporting Period 07/11/2022 to 08/14/2022   See note below for Objective Data and Assessment of Progress/Goals.        Patient Name: [name] MRN: [id number] DOB:02-21-1946, 77 y.o., female Today's Date: 08/15/2022   PT End of Session - 08/14/22 1700     Visit Number 30    Number of Visits 40    Date for PT Re-Evaluation 09/18/22    Authorization Type UHC MCR    Progress Note Due on Visit 61    PT Start Time 1608    PT Stop Time 1653    PT Time Calculation (min) 45 min    Activity Tolerance Patient tolerated treatment well    Behavior During Therapy [facility] for tasks assessed/performed                     Past Medical History:  Diagnosis Date   Anemia    Anginal pain ([place])    last cp was last year   Anxiety    Arthritis    Blood transfusion    Chest pain 02/04/2016   Coronary artery disease    Coronary artery spasm, wth continued episodes of chest pain.  09/26/2013   Coronary vasospasm (HCC)    Diabetes mellitus without complication (HCC)    type II   GERD (gastroesophageal reflux disease)    Headache    Hiatal hernia    Hypercholesteremia    Hypertension    NSTEMI (non-ST elevated myocardial infarction) ([place]) not sure   NSVT (nonsustained ventricular tachycardia) ([place]) 09/26/2013   Panic attack    Past Surgical History:  Procedure Laterality Date   ABDOMINAL HYSTERECTOMY     PARTIAL HYSTERECTOMY   ACDF N/A    C5-6 ACDF Dr. Sherwood Gambler   breast     right, tumor benign   BREAST EXCISIONAL BIOPSY Right    1961 (age 46) benign   CARDIAC CATHETERIZATION  2014   CARDIAC CATHETERIZATION  2012   CARDIAC CATHETERIZATION N/A 07/22/2016   Procedure: Left Heart Cath and Coronary Angiography;  Surgeon: Belva Crome, MD;  Location: [place] [facility];  Service: Cardiovascular;  Laterality: N/A;   cardiac stent  2012   to RCA   CHOLECYSTECTOMY N/A 10/02/2017   Procedure:  LAPAROSCOPIC CHOLECYSTECTOMY;  Surgeon: Ralene Ok, MD;  Location: [place];  Service: General;  Laterality: N/A;   CORONARY ANGIOPLASTY     ESOPHAGEAL MANOMETRY N/A 10/29/2016   Procedure: ESOPHAGEAL MANOMETRY (EM);  Surgeon: Garlan Fair, MD;  [facility];  Service: Endoscopy;  Laterality: N/A;   ESOPHAGOGASTRODUODENOSCOPY (EGD) WITH PROPOFOL N/A 10/28/2016   Procedure: ESOPHAGOGASTRODUODENOSCOPY (EGD) WITH PROPOFOL;  Surgeon: Garlan Fair, MD;  [facility];  Service: Endoscopy;  Laterality: N/A;   EXCISION OF SKIN TAG N/A 07/29/2017   Procedure: EXCISION OF PERIANAL  SKIN TAG;  Surgeon: Ralene Ok, MD;  Location: [place];  Service: General;  Laterality: N/A;   KNEE SURGERY  left   left ear surgery     for bad cut   LEFT HEART CATHETERIZATION WITH CORONARY ANGIOGRAM N/A 02/22/2013   Procedure: LEFT HEART CATHETERIZATION WITH CORONARY ANGIOGRAM;  Surgeon: [name], MD;  Location: [facility];  Service: Cardiovascular;  Laterality: N/A;   REVERSE SHOULDER ARTHROPLASTY Right 04/07/2022   Procedure: RIGHT SHOULDER REVERSE SHOULDER ARTHROPLASTY;  Surgeon: Vanetta Mulders, MD;  Location: [place];  Service: Orthopedics;  Laterality: Right;  SHOULDER SURGERY Bilateral    rotator cuff   TOE SURGERY Left    toe surgery   Patient Active Problem List   Diagnosis Date Noted   Rotator cuff arthropathy of right shoulder    Carpal tunnel syndrome, right upper limb 01/03/2022   Chest pain 06/21/2021   Variant angina ([place]) 01/24/2017   Precordial chest pain 01/23/2017   Headache 01/23/2017   S/P angioplasty with stent 01/23/2017   Coronary artery disease 11/24/2014   Syncope 10/22/2014   Hyperglycemia 04/20/2014   H/O hiatal hernia 04/19/2014   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    Coronary artery vasospasm ([place]) 09/26/2013   HTN (hypertension) 12/08/2011   GERD (gastroesophageal reflux disease) 12/08/2011   Depression 12/08/2011   Dyslipidemia 12/08/2011     REFERRING  PROVIDER: Vanetta Mulders MD  REFERRING DIAG: s/p Rt reverse TSA + biceps tenodesis, without subscap repair    THERAPY DIAG:  Acute pain of right shoulder  Stiffness of right shoulder, not elsewhere classified  Muscle weakness (generalized)  Rationale for Evaluation and Treatment Rehabilitation  ONSET DATE: DOS 04/07/22  SUBJECTIVE:                                                                                                                                                                                      SUBJECTIVE STATEMENT: Pt is 18 weeks and 3 days s/p R reverse TSA and biceps tenodesis.  Pt states she has stiffness and soreness in R shoulder though not as bad as it has been.  Pt had carpal tunnel release surgery in July.  She continues to have wrist pain and has occasional sharp pain in palm of R hand.    PAIN LEVELS:  0/10 pain in R shoulder.  Worst pain:  3/10.  2/10 pain in R wrist FUNCTIONAL IMPROVEMENTS:  reaching across body to apply deodorant, holding her coffee cup, donning shoes and jacket, UE elevation.  Pt reports slight difficulty with self care activities.   FUNCTIONAL LIMITATIONS:  reaching behind back and behind head, opening cans, donning jacket, overhead activities.  Pt states she has a lot of difficulty with reaching activities.      PERTINENT HISTORY: H/o bil RCR R carpal tunnel release in July 2023   PRECAUTIONS: Shoulder, reverse TSA without subscap repair  WEIGHT BEARING RESTRICTIONS Yes    FALLS:  Has patient fallen in last 6 months? No  LIVING ENVIRONMENT: Lives with: lives with their spouse No stairs  OCCUPATION: Not working  PLOF: Independent  PATIENT GOALS the use of my arm and hand back, gardening  OBJECTIVE:    TODAY'S TREATMENT:   Therapeutic Exercise: -Reviewed pain levels, response to prior Rx,  and HEP compliance.  -R shoulder AROM/PROM:   Flex:  121/150 deg Scaption:  108 Abd:  PROM:  109  ER:  52/56 IR:   56/64  -R shoulder strength: Flex: tolerated slight resistance  ER:  tolerated minimal resistance    PATIENT SURVEYS:  FOTO:  Prior/Current:  41/53. Goal of 47 at visit #20  -Pt received R shoulder PROM in flexion, abd, scaption, ER, and IR per protocol ranges w/n pt and tissue tolerance.  Pt performed:  Pulleys x 20 each in flex, abd, and scaption   Shoulder flexion seated on bench inclined at 3, 2x10 reps independently  standing jobe's flexion 2x10 reps  Standing ER with YTB 2x10  Supine rhythmic stab's at 90/60/120 deg 2x30 sec each     PATIENT EDUCATION: Education details:  POC, HEP, and exercise form/rationale.  PT instructed pt to ice shoulder if she has soreness or pain later.  Person educated: Patient and Spouse Education method: Explanation, Demonstration, Tactile cues, Verbal cues, and Handouts Education comprehension: verbalized understanding, returned demonstration, verbal cues required, tactile cues required, and needs further education   HOME EXERCISE PROGRAM: [email or web address]     ASSESSMENT:  CLINICAL IMPRESSION: Pt is progressing with sx's, strength, ROM, and function.  She continues to have difficulty with ADLs though is making progress and reports slight difficulty with self care activities.  Pt continues to have difficulty and limitations with reaching activities including overhead and behind back.  Pt is improving with R shoulder ROM t/o including UE elevation.  She has weakness in R shoulder though is progressing with exercises per protocol.  Pt demonstrates improved self perceived disability with FOTO score improving from 41 prior to 10 currently.  Pt has met STG's #1,2,4 and LTG #3,4 and is progressing toward other goals.  Pt should continue to benefit from cont skilled PT services to address ongoing goals and to assist with restoring desired level of function.     OBJECTIVE IMPAIRMENTS decreased activity tolerance, decreased knowledge of condition, decreased ROM,  decreased strength, increased muscle spasms, impaired flexibility, impaired UE functional use, improper body mechanics, postural dysfunction, and pain.   ACTIVITY LIMITATIONS carrying, lifting, sleeping, bed mobility, bathing, dressing, reach over head, and hygiene/grooming  PARTICIPATION LIMITATIONS: meal prep, cleaning, laundry, driving, shopping, and yard work  PERSONAL FACTORS Past/current experiences and see PMH  are also affecting patient's functional outcome.   REHAB POTENTIAL: Good  CLINICAL DECISION MAKING: Stable/uncomplicated  EVALUATION COMPLEXITY: Low   GOALS:  SHORT TERM GOALS: Target date: 05/09/2022   AROM flexion to 90 without incr pain and good scap control Baseline:see chart Goal status: GOAL MET  2.  ER in scapular plane to 30 deg Baseline: see chart Goal status: GOAL MET  3.  Pain levels <=3/10 Baseline: see subj reports Goal status:  ONGOING  4.  D/c sling Baseline: as tolerated at 3 wk Goal status: GOAL MET   LONG TERM GOALS: Target date: 08/20/2022     1.  Pt will meet FOTO goal Baseline: see above Goal status: ONGOING Target date: 06/20/22  2.  AROM flexion to 130 without incr pain and good scap control Baseline:  Goal status:  PARTIALLY MET Target date: 09/18/2022  3.  Use of arm for ADLs pain <=3/10 Baseline:  Goal status: GOAL MET Target date: 05/23/2022   4.  Initiate functional IR behind back without incr pain Baseline: can begin at 6 weeks per post op protocol Goal status: GOAL MET Target date: 05/23/2022   5.  Pt will demo R shoulder AROM to be [facility] t/o for performance of ADLs and IADLs.  Goal status: PROGRESSING Target date: 09/18/22  6.  Pt will be able to perform her ADLs and IADLs including reaching activities without significant pain and difficulty.  Goal status: PROGRESSING Target date: 09/18/22    7.  Pt will be able to reach into an overhead cabinet without significant difficulty.  Goal status: ONGOING Target date:  08/01/22    PLAN: PT FREQUENCY: 2x/wk   PT DURATION: 5 weeks  PLANNED INTERVENTIONS: Therapeutic exercises, Therapeutic activity, Neuromuscular re-education, Balance training, Gait training, Patient/Family education, Self Care, Joint mobilization, Aquatic Therapy, Dry Needling, Electrical stimulation, Spinal mobilization, Cryotherapy, Moist heat, scar mobilization, Taping, Vasopneumatic device, Manual therapy, and Re-evaluation  PLAN FOR NEXT SESSION: continue per Dr. Eddie Dibbles reverse TSA protocol.   Selinda Michaels III PT, DPT 08/15/22 3:19 PM

## 2022-08-15 ENCOUNTER — Ambulatory Visit
Admission: RE | Admit: 2022-08-15 | Discharge: 2022-08-15 | Disposition: A | Discharge status: Home / Self Care | Payer: Medicare Other | Source: Ambulatory Visit | Attending: Internal Medicine | Admitting: Internal Medicine

## 2022-08-15 DIAGNOSIS — Z1231 Encounter for screening mammogram for malignant neoplasm of breast: Secondary | ICD-10-CM

## 2022-08-19 ENCOUNTER — Ambulatory Visit (HOSPITAL_BASED_OUTPATIENT_CLINIC_OR_DEPARTMENT_OTHER): Payer: Medicare Other | Admitting: Physical Therapy

## 2022-08-19 ENCOUNTER — Encounter (HOSPITAL_BASED_OUTPATIENT_CLINIC_OR_DEPARTMENT_OTHER): Payer: Self-pay | Admitting: Physical Therapy

## 2022-08-19 DIAGNOSIS — M25511 Pain in right shoulder: Secondary | ICD-10-CM

## 2022-08-19 DIAGNOSIS — M6281 Muscle weakness (generalized): Secondary | ICD-10-CM | POA: Diagnosis not present

## 2022-08-19 DIAGNOSIS — M25611 Stiffness of right shoulder, not elsewhere classified: Secondary | ICD-10-CM

## 2022-08-19 NOTE — Therapy (Signed)
OUTPATIENT PHYSICAL THERAPY SHOULDER TREATMENT       Patient Name: [name] MRN: [id number] DOB:1946/04/13, 77 y.o., female Today's Date: 08/19/2022   PT End of Session - 08/19/22 0810     Visit Number 31    Number of Visits 40    Date for PT Re-Evaluation 09/18/22    Authorization Type U[employer] MCR    PT Start Time 0802    PT Stop Time 0846    PT Time Calculation (min) 44 min    Activity Tolerance Patient tolerated treatment well    Behavior During Therapy [facility] for tasks assessed/performed                      Past Medical History:  Diagnosis Date   Anemia    Anginal pain ([place])    last cp was last year   Anxiety    Arthritis    Blood transfusion    Chest pain 02/04/2016   Coronary artery disease    Coronary artery spasm, wth continued episodes of chest pain.  09/26/2013   Coronary vasospasm (HCC)    Diabetes mellitus without complication (HCC)    type II   GERD (gastroesophageal reflux disease)    Headache    Hiatal hernia    Hypercholesteremia    Hypertension    NSTEMI (non-ST elevated myocardial infarction) ([place]) not sure   NSVT (nonsustained ventricular tachycardia) ([place]) 09/26/2013   Panic attack    Past Surgical History:  Procedure Laterality Date   ABDOMINAL HYSTERECTOMY     PARTIAL HYSTERECTOMY   ACDF N/A    C5-6 ACDF Dr. Sherwood Gambler   breast     right, tumor benign   BREAST EXCISIONAL BIOPSY Right    1961 (age 16) benign   CARDIAC CATHETERIZATION  2014   CARDIAC CATHETERIZATION  2012   CARDIAC CATHETERIZATION N/A 07/22/2016   Procedure: Left Heart Cath and Coronary Angiography;  Surgeon: Belva Crome, MD;  Location: [place] CV [facility];  Service: Cardiovascular;  Laterality: N/A;   cardiac stent  2012   to RCA   CHOLECYSTECTOMY N/A 10/02/2017   Procedure: LAPAROSCOPIC CHOLECYSTECTOMY;  Surgeon: Ralene Ok, MD;  Location: [place];  Service: General;  Laterality: N/A;   CORONARY ANGIOPLASTY     ESOPHAGEAL MANOMETRY N/A 10/29/2016    Procedure: ESOPHAGEAL MANOMETRY (EM);  Surgeon: Garlan Fair, MD;  Location: [facility];  Service: Endoscopy;  Laterality: N/A;   ESOPHAGOGASTRODUODENOSCOPY (EGD) WITH PROPOFOL N/A 10/28/2016   Procedure: ESOPHAGOGASTRODUODENOSCOPY (EGD) WITH PROPOFOL;  Surgeon: Garlan Fair, MD;  Location: [facility];  Service: Endoscopy;  Laterality: N/A;   EXCISION OF SKIN TAG N/A 07/29/2017   Procedure: EXCISION OF PERIANAL  SKIN TAG;  Surgeon: Ralene Ok, MD;  Location: [place];  Service: General;  Laterality: N/A;   KNEE SURGERY  left   left ear surgery     for bad cut   LEFT HEART CATHETERIZATION WITH CORONARY ANGIOGRAM N/A 02/22/2013   Procedure: LEFT HEART CATHETERIZATION WITH CORONARY ANGIOGRAM;  Surgeon: [name], MD;  Location: [facility] C[facility] LAB;  Service: Cardiovascular;  Laterality: N/A;   REVERSE SHOULDER ARTHROPLASTY Right 04/07/2022   Procedure: RIGHT SHOULDER REVERSE SHOULDER ARTHROPLASTY;  Surgeon: Vanetta Mulders, MD;  Location: [place];  Service: Orthopedics;  Laterality: Right;   SHOULDER SURGERY Bilateral    rotator cuff   TOE SURGERY Left    toe surgery   Patient Active Problem List   Diagnosis Date Noted   Rotator  cuff arthropathy of right shoulder    Carpal tunnel syndrome, right upper limb 01/03/2022   Chest pain 06/21/2021   Variant angina ([place]) 01/24/2017   Precordial chest pain 01/23/2017   Headache 01/23/2017   S/P angioplasty with stent 01/23/2017   Coronary artery disease 11/24/2014   Syncope 10/22/2014   Hyperglycemia 04/20/2014   H/O hiatal hernia 04/19/2014   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    Coronary artery vasospasm ([place]) 09/26/2013   HTN (hypertension) 12/08/2011   GERD (gastroesophageal reflux disease) 12/08/2011   Depression 12/08/2011   Dyslipidemia 12/08/2011     REFERRING PROVIDER: Vanetta Mulders MD  REFERRING DIAG: s/p Rt reverse TSA + biceps tenodesis, without subscap repair    THERAPY DIAG:  Acute pain of right shoulder  Stiffness of right  shoulder, not elsewhere classified  Muscle weakness (generalized)  Rationale for Evaluation and Treatment Rehabilitation  ONSET DATE: DOS 04/07/22  SUBJECTIVE:                                                                                                                                                                                      SUBJECTIVE STATEMENT: Pt is 19 weeks and 1 day s/p R reverse TSA and biceps tenodesis.  Pt states she felt good after prior Rx.  Pt reports having pain, stiffness, and difficulty with moving R UE the past 2 days.  Pt reports increased pain and difficulty with reaching behind back and across body to apply deodorant.  Pt states she has had wrist pain also.  Pt is compliant with HEP.  Pt has been performing function IR AROM with assistance from R UE at end range.  PAIN LEVELS:  3/10 pain in R shoulder and wrist.  Worst pain:  3/10. FUNCTIONAL IMPROVEMENTS:  reaching across body to apply deodorant, holding her coffee cup, donning shoes and jacket, UE elevation.  Pt reports slight difficulty with self care activities.   FUNCTIONAL LIMITATIONS:  reaching behind back and behind head, opening cans, donning jacket, overhead activities.  Pt states she has a lot of difficulty with reaching activities.      PERTINENT HISTORY: H/o bil RCR R carpal tunnel release in July 2023   PRECAUTIONS: Shoulder, reverse TSA without subscap repair  WEIGHT BEARING RESTRICTIONS Yes    FALLS:  Has patient fallen in last 6 months? No  LIVING ENVIRONMENT: Lives with: lives with their spouse No stairs  OCCUPATION: Not working  PLOF: Independent  PATIENT GOALS the use of my arm and hand back, gardening  OBJECTIVE:    TODAY'S TREATMENT:   Therapeutic Exercise: -Reviewed pain levels, response to prior Rx, and HEP compliance.   -Pt received R shoulder PROM  in flexion, abd, scaption, ER, and IR per protocol ranges w/n pt and tissue tolerance.  Pt performed:  Pulleys  x 20 each in flex, abd, and scaption   S/L ER 2x10  Supine rhythmic stab's at 90/60/120 deg 2x30 sec each  Supine serratus punch 2x10 1#  Standing functional IR AROM 2x10            Standing wand IR 2x10  standing jobe's flexion 2x10 reps  Standing ER with YTB 2x10       PATIENT EDUCATION: Education details:  POC, HEP, and exercise form/rationale.  PT instructed pt to ice shoulder if she has soreness or pain later.  Person educated: Patient and Spouse Education method: Explanation, Demonstration, Tactile cues, Verbal cues, and Handouts Education comprehension: verbalized understanding, returned demonstration, verbal cues required, tactile cues required, and needs further education   HOME EXERCISE PROGRAM: [id number]   Instructed pt to perform Standing active Shoulder functional IR AROM at home 2x/day 2x10.  Instructed pt to not perform in a painful range.      ASSESSMENT:  CLINICAL IMPRESSION: Pt continues to have difficulty with ADLs and reaching activities though is making progress.  Pt performed functional IR ROM and PT instructed pt in correct form.  Pt demonstrates improved form with increased repetitions  PT updated HEP and pt demonstrates good understanding.  Pt performed exercises per protocol well with cuing and instruction in correct form.  Pt had improved tightness in PROM today.  She responded well to Rx having no c/o's after Rx.  Pt should continue to benefit from cont skilled PT services to address ongoing goals and to assist with restoring desired level of function.     OBJECTIVE IMPAIRMENTS decreased activity tolerance, decreased knowledge of condition, decreased ROM, decreased strength, increased muscle spasms, impaired flexibility, impaired UE functional use, improper body mechanics, postural dysfunction, and pain.   ACTIVITY LIMITATIONS carrying, lifting, sleeping, bed mobility, bathing, dressing, reach over head, and hygiene/grooming  PARTICIPATION LIMITATIONS:  meal prep, cleaning, laundry, driving, shopping, and yard work  PERSONAL FACTORS Past/current experiences and see PMH  are also affecting patient's functional outcome.   REHAB POTENTIAL: Good  CLINICAL DECISION MAKING: Stable/uncomplicated  EVALUATION COMPLEXITY: Low   GOALS:  SHORT TERM GOALS: Target date: 05/09/2022   AROM flexion to 90 without incr pain and good scap control Baseline:see chart Goal status: GOAL MET  2.  ER in scapular plane to 30 deg Baseline: see chart Goal status: GOAL MET  3.  Pain levels <=3/10 Baseline: see subj reports Goal status:  ONGOING  4.  D/c sling Baseline: as tolerated at 3 wk Goal status: GOAL MET   LONG TERM GOALS: Target date: 08/20/2022     1.  Pt will meet FOTO goal Baseline: see above Goal status: ONGOING Target date: 06/20/22  2.  AROM flexion to 130 without incr pain and good scap control Baseline:  Goal status:  PARTIALLY MET Target date: 09/18/2022  3.  Use of arm for ADLs pain <=3/10 Baseline:  Goal status: GOAL MET Target date: 05/23/2022   4.  Initiate functional IR behind back without incr pain Baseline: can begin at 6 weeks per post op protocol Goal status: GOAL MET Target date: 05/23/2022   5.  Pt will demo R shoulder AROM to be [facility] t/o for performance of ADLs and IADLs.  Goal status: PROGRESSING Target date: 09/18/22  6.  Pt will be able to perform her ADLs and IADLs including reaching activities without significant pain and  difficulty.  Goal status: PROGRESSING Target date: 09/18/22    7.  Pt will be able to reach into an overhead cabinet without significant difficulty.  Goal status: ONGOING Target date: 08/01/22    PLAN: PT FREQUENCY: 2x/wk   PT DURATION: 5 weeks  PLANNED INTERVENTIONS: Therapeutic exercises, Therapeutic activity, Neuromuscular re-education, Balance training, Gait training, Patient/Family education, Self Care, Joint mobilization, Aquatic Therapy, Dry Needling, Electrical  stimulation, Spinal mobilization, Cryotherapy, Moist heat, scar mobilization, Taping, Vasopneumatic device, Manual therapy, and Re-evaluation  PLAN FOR NEXT SESSION: continue per Dr. Eddie Dibbles reverse TSA protocol.   Selinda Michaels III PT, DPT 08/19/22 5:45 PM

## 2022-08-21 ENCOUNTER — Encounter (HOSPITAL_BASED_OUTPATIENT_CLINIC_OR_DEPARTMENT_OTHER): Payer: Self-pay | Admitting: Physical Therapy

## 2022-08-21 ENCOUNTER — Ambulatory Visit (HOSPITAL_BASED_OUTPATIENT_CLINIC_OR_DEPARTMENT_OTHER): Payer: Medicare Other | Admitting: Physical Therapy

## 2022-08-21 DIAGNOSIS — M25511 Pain in right shoulder: Secondary | ICD-10-CM

## 2022-08-21 DIAGNOSIS — M6281 Muscle weakness (generalized): Secondary | ICD-10-CM

## 2022-08-21 DIAGNOSIS — M25611 Stiffness of right shoulder, not elsewhere classified: Secondary | ICD-10-CM

## 2022-08-21 NOTE — Therapy (Signed)
OUTPATIENT PHYSICAL THERAPY SHOULDER TREATMENT       Patient Name: [name] MRN: [id number] DOB:Oct 12, 1945, 77 y.o., female Today's Date: 08/21/2022   PT End of Session - 08/21/22 1354     Visit Number 32    Number of Visits 40    Date for PT Re-Evaluation 09/18/22    Authorization Type UHC MCR    PT Start Time 1351    PT Stop Time 1433    PT Time Calculation (min) 42 min    Activity Tolerance Patient tolerated treatment well    Behavior During Therapy WFL for tasks assessed/performed                      Past Medical History:  Diagnosis Date   Anemia    Anginal pain ([place])    last cp was last year   Anxiety    Arthritis    Blood transfusion    Chest pain 02/04/2016   Coronary artery disease    Coronary artery spasm, wth continued episodes of chest pain.  09/26/2013   Coronary vasospasm (HCC)    Diabetes mellitus without complication (HCC)    type II   GERD (gastroesophageal reflux disease)    Headache    Hiatal hernia    Hypercholesteremia    Hypertension    NSTEMI (non-ST elevated myocardial infarction) ([place]) not sure   NSVT (nonsustained ventricular tachycardia) ([place]) 09/26/2013   Panic attack    Past Surgical History:  Procedure Laterality Date   ABDOMINAL HYSTERECTOMY     PARTIAL HYSTERECTOMY   ACDF N/A    C5-6 ACDF Dr. Sherwood Gambler   breast     right, tumor benign   BREAST EXCISIONAL BIOPSY Right    1961 (age 76) benign   CARDIAC CATHETERIZATION  2014   CARDIAC CATHETERIZATION  2012   CARDIAC CATHETERIZATION N/A 07/22/2016   Procedure: Left Heart Cath and Coronary Angiography;  Surgeon: Belva Crome, MD;  Location: [place] CV LAB;  Service: Cardiovascular;  Laterality: N/A;   cardiac stent  2012   to RCA   CHOLECYSTECTOMY N/A 10/02/2017   Procedure: LAPAROSCOPIC CHOLECYSTECTOMY;  Surgeon: Ralene Ok, MD;  Location: [place];  Service: General;  Laterality: N/A;   CORONARY ANGIOPLASTY     ESOPHAGEAL MANOMETRY N/A 10/29/2016    Procedure: ESOPHAGEAL MANOMETRY (EM);  Surgeon: Garlan Fair, MD;  [facility];  Service: Endoscopy;  Laterality: N/A;   ESOPHAGOGASTRODUODENOSCOPY (EGD) WITH PROPOFOL N/A 10/28/2016   Procedure: ESOPHAGOGASTRODUODENOSCOPY (EGD) WITH PROPOFOL;  Surgeon: Garlan Fair, MD;  [facility];  Service: Endoscopy;  Laterality: N/A;   EXCISION OF SKIN TAG N/A 07/29/2017   Procedure: EXCISION OF PERIANAL  SKIN TAG;  Surgeon: Ralene Ok, MD;  Location: [place];  Service: General;  Laterality: N/A;   KNEE SURGERY  left   left ear surgery     for bad cut   LEFT HEART CATHETERIZATION WITH CORONARY ANGIOGRAM N/A 02/22/2013   Procedure: LEFT HEART CATHETERIZATION WITH CORONARY ANGIOGRAM;  Surgeon: [name], MD;  Location: [facility] CATH LAB;  Service: Cardiovascular;  Laterality: N/A;   REVERSE SHOULDER ARTHROPLASTY Right 04/07/2022   Procedure: RIGHT SHOULDER REVERSE SHOULDER ARTHROPLASTY;  Surgeon: Vanetta Mulders, MD;  Location: [place];  Service: Orthopedics;  Laterality: Right;   SHOULDER SURGERY Bilateral    rotator cuff   TOE SURGERY Left    toe surgery   Patient Active Problem List   Diagnosis Date Noted   Rotator  cuff arthropathy of right shoulder    Carpal tunnel syndrome, right upper limb 01/03/2022   Chest pain 06/21/2021   Variant angina ([place]) 01/24/2017   Precordial chest pain 01/23/2017   Headache 01/23/2017   S/P angioplasty with stent 01/23/2017   Coronary artery disease 11/24/2014   Syncope 10/22/2014   Hyperglycemia 04/20/2014   H/O hiatal hernia 04/19/2014   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  Rationale for Evaluation and  Treatment Rehabilitation  ONSET DATE: DOS 04/07/22  SUBJECTIVE:                                                                                                                                                                                      SUBJECTIVE STATEMENT: Pt is 19 weeks and 3 days s/p R reverse TSA and biceps tenodesis.  Pt states she felt good after prior Rx.  Pt is compliant with HEP.    PAIN LEVELS:  1/10 pain in R shoulder and 2/10 in wrist.  Worst pain:  3/10. FUNCTIONAL IMPROVEMENTS:  reaching across body to apply deodorant, holding her coffee cup, donning shoes and jacket, UE elevation.  Pt reports slight difficulty with self care activities.   FUNCTIONAL LIMITATIONS:  reaching behind back and behind head, opening cans, donning jacket, overhead activities.  Pt states she has a lot of difficulty with reaching activities.      PERTINENT HISTORY: H/o bil RCR R carpal tunnel release in July 2023   PRECAUTIONS: Shoulder, reverse TSA without subscap repair  WEIGHT BEARING RESTRICTIONS Yes    FALLS:  Has patient fallen in last 6 months? No  LIVING ENVIRONMENT: Lives with: lives with their spouse No stairs  OCCUPATION: Not working  PLOF: Independent  PATIENT GOALS the use of my arm and hand back, gardening  OBJECTIVE:    TODAY'S TREATMENT:   Therapeutic Exercise: -Reviewed pain levels, response to prior Rx, and HEP compliance.   -Pt received R shoulder PROM in flexion, abd, ER, and IR per protocol ranges w/n pt and tissue tolerance.  Pt performed:  Pulleys x 20 each in flex, abd, and scaption  standing jobe's flexion 2x10 reps  Standing scaption 2x10  Shelf reach 2x10   S/L ER 2x10  Supine rhythmic stab's at 90/60/120 deg 2x30 sec each  Supine serratus punch 1x10 1#, 2x10 2#  Supine ABC with  1# x1  Attempted Prone shoulder extension with 1#, but unable  Prone row with 1# x10  Standing functional IR AROM 2x10            Standing wand IR  1x10  Standing ER with YTB 2x10      PATIENT EDUCATION: Education details:  POC, HEP, and exercise form/rationale.   Person educated: Patient and Spouse Education method: Explanation, Demonstration, Tactile cues, Verbal cues, and Handouts Education comprehension: verbalized understanding, returned demonstration, verbal cues required, tactile cues required, and needs further education   HOME EXERCISE PROGRAM: [email or web address]    ASSESSMENT:  CLINICAL IMPRESSION: Pt presents to Rx with her shoulder feeling better today.  Pt is progressing with reaching and UE elevation.  She demonstrates improved form with jobe's flexion and was able to perform standing scaption well.  She did have some difficulty with shelf reach as she performed increased reps on the 2nd set, but had improved form overall.  She does require some cuing and instruction in correct form with exercises.  Pt attempted prone extension with 1#, but unable to perform.  Pt has improved tightness and with PROM.  She responded well to Rx having no c/o's and no increased pain after Rx.  Pt should continue to benefit from cont skilled PT services to address ongoing goals and to assist with restoring desired level of function.      OBJECTIVE IMPAIRMENTS decreased activity tolerance, decreased knowledge of condition, decreased ROM, decreased strength, increased muscle spasms, impaired flexibility, impaired UE functional use, improper body mechanics, postural dysfunction, and pain.   ACTIVITY LIMITATIONS carrying, lifting, sleeping, bed mobility, bathing, dressing, reach over head, and hygiene/grooming  PARTICIPATION LIMITATIONS: meal prep, cleaning, laundry, driving, shopping, and yard work  PERSONAL FACTORS Past/current experiences and see PMH  are also affecting patient's functional outcome.   REHAB POTENTIAL: Good  CLINICAL DECISION MAKING: Stable/uncomplicated  EVALUATION COMPLEXITY: Low   GOALS:  SHORT TERM GOALS: Target date:  05/09/2022   AROM flexion to 90 without incr pain and good scap control Baseline:see chart Goal status: GOAL MET  2.  ER in scapular plane to 30 deg Baseline: see chart Goal status: GOAL MET  3.  Pain levels <=3/10 Baseline: see subj reports Goal status:  ONGOING  4.  D/c sling Baseline: as tolerated at 3 wk Goal status: GOAL MET   LONG TERM GOALS: Target date: 08/20/2022     1.  Pt will meet FOTO goal Baseline: see above Goal status: ONGOING Target date: 06/20/22  2.  AROM flexion to 130 without incr pain and good scap control Baseline:  Goal status:  PARTIALLY MET Target date: 09/18/2022  3.  Use of arm for ADLs pain <=3/10 Baseline:  Goal status: GOAL MET Target date: 05/23/2022   4.  Initiate functional IR behind back without incr pain Baseline: can begin at 6 weeks per post op protocol Goal status: GOAL MET Target date: 05/23/2022   5.  Pt will demo R shoulder AROM to be [facility] t/o for performance of ADLs and IADLs.  Goal status: PROGRESSING Target date: 09/18/22  6.  Pt will be able to perform her ADLs and IADLs including reaching activities without significant pain and difficulty.  Goal status: PROGRESSING Target date: 09/18/22    7.  Pt will be able to reach into an overhead cabinet without significant difficulty.  Goal status: ONGOING Target date: 08/01/22    PLAN: PT FREQUENCY: 2x/wk   PT DURATION: 5 weeks  PLANNED INTERVENTIONS: Therapeutic exercises,  Therapeutic activity, Neuromuscular re-education, Balance training, Gait training, Patient/Family education, Self Care, Joint mobilization, Aquatic Therapy, Dry Needling, Electrical stimulation, Spinal mobilization, Cryotherapy, Moist heat, scar mobilization, Taping, Vasopneumatic device, Manual therapy, and Re-evaluation  PLAN FOR NEXT SESSION: continue per Dr. Eddie Dibbles reverse TSA protocol.   Selinda Michaels III PT, DPT 08/21/22 5:34 PM

## 2022-08-24 NOTE — Therapy (Signed)
OUTPATIENT PHYSICAL THERAPY SHOULDER TREATMENT       Patient Name: [name] MRN: [id number] DOB:05/04/1946, 77 y.o., female Today's Date: 08/26/2022   PT End of Session - 08/25/22 1353     Visit Number 33    Number of Visits 40    Date for PT Re-Evaluation 09/18/22    Authorization Type U[employer] MCR    Progress Note Due on Visit 27    PT Start Time 1305    PT Stop Time 1345    PT Time Calculation (min) 40 min    Activity Tolerance Patient tolerated treatment well    Behavior During Therapy WFL for tasks assessed/performed                       Past Medical History:  Diagnosis Date   Anemia    Anginal pain ([place])    last cp was last year   Anxiety    Arthritis    Blood transfusion    Chest pain 02/04/2016   Coronary artery disease    Coronary artery spasm, wth continued episodes of chest pain.  09/26/2013   Coronary vasospasm (H[facility])    Diabetes mellitus without complication (HCC)    type II   GERD (gastroesophageal reflux disease)    Headache    Hiatal hernia    Hypercholesteremia    Hypertension    NSTEMI (non-ST elevated myocardial infarction) ([place]) not sure   NSVT (nonsustained ventricular tachycardia) ([place]) 09/26/2013   Panic attack    Past Surgical History:  Procedure Laterality Date   ABDOMINAL HYSTERECTOMY     PARTIAL HYSTERECTOMY   ACDF N/A    C5-6 ACDF Dr. Sherwood Gambler   breast     right, tumor benign   BREAST EXCISIONAL BIOPSY Right    1961 (age 6) benign   CARDIAC CATHETERIZATION  2014   CARDIAC CATHETERIZATION  2012   CARDIAC CATHETERIZATION N/A 07/22/2016   Procedure: Left Heart Cath and Coronary Angiography;  Surgeon: Belva Crome, MD;  Location: [facility];  Service: Cardiovascular;  Laterality: N/A;   cardiac stent  2012   to RCA   CHOLECYSTECTOMY N/A 10/02/2017   Procedure: LAPAROSCOPIC CHOLECYSTECTOMY;  Surgeon: Ralene Ok, MD;  Location: [place];  Service: General;  Laterality: N/A;   CORONARY ANGIOPLASTY      ESOPHAGEAL MANOMETRY N/A 10/29/2016   Procedure: ESOPHAGEAL MANOMETRY (EM);  Surgeon: Garlan Fair, MD;  [facility]: [facility];  Service: Endoscopy;  Laterality: N/A;   ESOPHAGOGASTRODUODENOSCOPY (EGD) WITH PROPOFOL N/A 10/28/2016   Procedure: ESOPHAGOGASTRODUODENOSCOPY (EGD) WITH PROPOFOL;  Surgeon: Garlan Fair, MD;  [facility];  Service: Endoscopy;  Laterality: N/A;   EXCISION OF SKIN TAG N/A 07/29/2017   Procedure: EXCISION OF PERIANAL  SKIN TAG;  Surgeon: Ralene Ok, MD;  Location: [place];  Service: General;  Laterality: N/A;   KNEE SURGERY  left   left ear surgery     for bad cut   LEFT HEART CATHETERIZATION WITH CORONARY ANGIOGRAM N/A 02/22/2013   Procedure: LEFT HEART CATHETERIZATION WITH CORONARY ANGIOGRAM;  Surgeon: [name], MD;  Location: [facility];  Service: Cardiovascular;  Laterality: N/A;   REVERSE SHOULDER ARTHROPLASTY Right 04/07/2022   Procedure: RIGHT SHOULDER REVERSE SHOULDER ARTHROPLASTY;  Surgeon: Vanetta Mulders, MD;  Location: [place];  Service: Orthopedics;  Laterality: Right;   SHOULDER SURGERY Bilateral    rotator cuff   TOE SURGERY Left    toe surgery   Patient Active  Problem List   Diagnosis Date Noted   Rotator cuff arthropathy of right shoulder    Carpal tunnel syndrome, right upper limb 01/03/2022   Chest pain 06/21/2021   Variant angina ([place]) 01/24/2017   Precordial chest pain 01/23/2017   Headache 01/23/2017   S/P angioplasty with stent 01/23/2017   Coronary artery disease 11/24/2014   Syncope 10/22/2014   Hyperglycemia 04/20/2014   H/O hiatal hernia 04/19/2014   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    Coronary artery vasospasm ([place]) 09/26/2013   HTN (hypertension) 12/08/2011   GERD (gastroesophageal reflux disease) 12/08/2011   Depression 12/08/2011   Dyslipidemia 12/08/2011     REFERRING PROVIDER: Vanetta Mulders MD  REFERRING DIAG: s/p Rt reverse TSA + biceps tenodesis, without subscap repair    THERAPY DIAG:  Acute pain of  right shoulder  Stiffness of right shoulder, not elsewhere classified  Muscle weakness (generalized)  Rationale for Evaluation and Treatment Rehabilitation  ONSET DATE: DOS 04/07/22  SUBJECTIVE:                                                                                                                                                                                      SUBJECTIVE STATEMENT: Pt is 20 weeks s/p R reverse TSA and biceps tenodesis.  Pt denies any adverse effects after prior Rx.  Pt reports having stiffness over the weekend.  Pt has difficulty with washing the L side of her body and the lower part of her leg with R UE.  She has difficulty closing her car door.  Pt reports her R shoulder is improving.  She is able to reach into cabinet better and also has improved ability with using her microwave.  Pt is trying to not let her shoulder hike when she reaches.  Pt is compliant with HEP.  Pt denies pain currently.  Pt reports she has pain with certain ways she moves her arm especially with ER.       PERTINENT HISTORY: H/o bil RCR R carpal tunnel release in July 2023   PRECAUTIONS: Shoulder, reverse TSA without subscap repair  WEIGHT BEARING RESTRICTIONS Yes    FALLS:  Has patient fallen in last 6 months? No  LIVING ENVIRONMENT: Lives with: lives with their spouse No stairs  OCCUPATION: Not working  PLOF: Independent  PATIENT GOALS the use of my arm and hand back, gardening  OBJECTIVE:    TODAY'S TREATMENT:   Therapeutic Exercise: -Reviewed pain levels, response to prior Rx, and HEP compliance.   -Pt received R shoulder PROM in flexion, abd, ER, and IR per protocol ranges w/n pt and tissue tolerance.  Pt performed:  Pulleys x 20 each in flex, abd,  and scaption  standing jobe's flexion with 1# 3x10 reps  Standing scaption 2x10  Shelf reach 2x10   Supine rhythmic stab's at 90/60/120 deg 2x30 sec each  Supine serratus punch 1x10 1#, 2x10 2#  Supine ABC  with 1# x1  Prone row with 1# x10 and 2# 2x10  Standing functional IR AROM 2x10            Standing wand IR 2x10  Standing ER with YTB 2x10      PATIENT EDUCATION: Education details:  POC, HEP, and exercise form/rationale.   Person educated: Patient and Spouse Education method: Explanation, Demonstration, Tactile cues, Verbal cues, and Handouts Education comprehension: verbalized understanding, returned demonstration, verbal cues required, tactile cues required, and needs further education   HOME EXERCISE PROGRAM: [id number]    ASSESSMENT:  CLINICAL IMPRESSION: Pt is progressing with function, reaching and UE elevation.  Pt continues to have limitations with reaching and daily activities though is improving. She was able to perform jobe's flexion with 1# well and is improving with form and control with standing scaption and shelf reach.  Pt performed exercises well and is improving with form requiring decreased cuing.  She is limited with reaching behind back though is improving.  Pt has improved tightness with PROM.  She responded well to Rx having no c/o's and no increased pain after Rx.  Pt should continue to benefit from cont skilled PT services to address ongoing goals and to assist with restoring desired level of function.      OBJECTIVE IMPAIRMENTS decreased activity tolerance, decreased knowledge of condition, decreased ROM, decreased strength, increased muscle spasms, impaired flexibility, impaired UE functional use, improper body mechanics, postural dysfunction, and pain.   ACTIVITY LIMITATIONS carrying, lifting, sleeping, bed mobility, bathing, dressing, reach over head, and hygiene/grooming  PARTICIPATION LIMITATIONS: meal prep, cleaning, laundry, driving, shopping, and yard work  PERSONAL FACTORS Past/current experiences and see PMH  are also affecting patient's functional outcome.   REHAB POTENTIAL: Good  CLINICAL DECISION MAKING: Stable/uncomplicated  EVALUATION  COMPLEXITY: Low   GOALS:  SHORT TERM GOALS: Target date: 05/09/2022   AROM flexion to 90 without incr pain and good scap control Baseline:see chart Goal status: GOAL MET  2.  ER in scapular plane to 30 deg Baseline: see chart Goal status: GOAL MET  3.  Pain levels <=3/10 Baseline: see subj reports Goal status:  ONGOING  4.  D/c sling Baseline: as tolerated at 3 wk Goal status: GOAL MET   LONG TERM GOALS: Target date: 08/20/2022     1.  Pt will meet FOTO goal Baseline: see above Goal status: ONGOING Target date: 06/20/22  2.  AROM flexion to 130 without incr pain and good scap control Baseline:  Goal status:  PARTIALLY MET Target date: 09/18/2022  3.  Use of arm for ADLs pain <=3/10 Baseline:  Goal status: GOAL MET Target date: 05/23/2022   4.  Initiate functional IR behind back without incr pain Baseline: can begin at 6 weeks per post op protocol Goal status: GOAL MET Target date: 05/23/2022   5.  Pt will demo R shoulder AROM to be [facility] t/o for performance of ADLs and IADLs.  Goal status: PROGRESSING Target date: 09/18/22  6.  Pt will be able to perform her ADLs and IADLs including reaching activities without significant pain and difficulty.  Goal status: PROGRESSING Target date: 09/18/22    7.  Pt will be able to reach into an overhead cabinet without significant difficulty.  Goal status:  ONGOING Target date: 08/01/22    PLAN: PT FREQUENCY: 2x/wk   PT DURATION: 5 weeks  PLANNED INTERVENTIONS: Therapeutic exercises, Therapeutic activity, Neuromuscular re-education, Balance training, Gait training, Patient/Family education, Self Care, Joint mobilization, Aquatic Therapy, Dry Needling, Electrical stimulation, Spinal mobilization, Cryotherapy, Moist heat, scar mobilization, Taping, Vasopneumatic device, Manual therapy, and Re-evaluation  PLAN FOR NEXT SESSION: continue per Dr. Eddie Dibbles reverse TSA protocol.   Selinda Michaels III PT, DPT 08/26/22 1:53  PM

## 2022-08-25 ENCOUNTER — Ambulatory Visit (HOSPITAL_BASED_OUTPATIENT_CLINIC_OR_DEPARTMENT_OTHER): Payer: Medicare Other | Admitting: Physical Therapy

## 2022-08-25 DIAGNOSIS — M25611 Stiffness of right shoulder, not elsewhere classified: Secondary | ICD-10-CM | POA: Diagnosis not present

## 2022-08-25 DIAGNOSIS — M25511 Pain in right shoulder: Secondary | ICD-10-CM | POA: Diagnosis not present

## 2022-08-25 DIAGNOSIS — M6281 Muscle weakness (generalized): Secondary | ICD-10-CM | POA: Diagnosis not present

## 2022-08-26 ENCOUNTER — Encounter (HOSPITAL_BASED_OUTPATIENT_CLINIC_OR_DEPARTMENT_OTHER): Payer: Self-pay | Admitting: Physical Therapy

## 2022-08-31 NOTE — Therapy (Signed)
OUTPATIENT PHYSICAL THERAPY SHOULDER TREATMENT       Patient Name: [name] MRN: [id number] DOB:1946/01/23, 77 y.o., female Today's Date: 09/02/2022   PT End of Session - 09/01/22 1322     Visit Number 34    Number of Visits 40    Date for PT Re-Evaluation 09/18/22    Authorization Type UHC MCR    PT Start Time 1300    PT Stop Time 1345    PT Time Calculation (min) 45 min    Activity Tolerance Patient tolerated treatment well    Behavior During Therapy WFL for tasks assessed/performed                        Past Medical History:  Diagnosis Date   Anemia    Anginal pain ([place])    last cp was last year   Anxiety    Arthritis    Blood transfusion    Chest pain 02/04/2016   Coronary artery disease    Coronary artery spasm, wth continued episodes of chest pain.  09/26/2013   Coronary vasospasm (HCC)    Diabetes mellitus without complication (HCC)    type II   GERD (gastroesophageal reflux disease)    Headache    Hiatal hernia    Hypercholesteremia    Hypertension    NSTEMI (non-ST elevated myocardial infarction) ([place]) not sure   NSVT (nonsustained ventricular tachycardia) ([place]) 09/26/2013   Panic attack    Past Surgical History:  Procedure Laterality Date   ABDOMINAL HYSTERECTOMY     PARTIAL HYSTERECTOMY   ACDF N/A    C5-6 ACDF Dr. Sherwood Gambler   breast     right, tumor benign   BREAST EXCISIONAL BIOPSY Right    1961 (age 88) benign   CARDIAC CATHETERIZATION  2014   CARDIAC CATHETERIZATION  2012   CARDIAC CATHETERIZATION N/A 07/22/2016   Procedure: Left Heart Cath and Coronary Angiography;  Surgeon: Belva Crome, MD;  Location: [place] [facility];  Service: Cardiovascular;  Laterality: N/A;   cardiac stent  2012   to RCA   CHOLECYSTECTOMY N/A 10/02/2017   Procedure: LAPAROSCOPIC CHOLECYSTECTOMY;  Surgeon: Ralene Ok, MD;  Location: [place];  Service: General;  Laterality: N/A;   CORONARY ANGIOPLASTY     ESOPHAGEAL MANOMETRY N/A  10/29/2016   Procedure: ESOPHAGEAL MANOMETRY (EM);  Surgeon: Garlan Fair, MD;  [facility];  Service: Endoscopy;  Laterality: N/A;   ESOPHAGOGASTRODUODENOSCOPY (EGD) WITH PROPOFOL N/A 10/28/2016   Procedure: ESOPHAGOGASTRODUODENOSCOPY (EGD) WITH PROPOFOL;  Surgeon: Garlan Fair, MD;  [facility];  Service: Endoscopy;  Laterality: N/A;   EXCISION OF SKIN TAG N/A 07/29/2017   Procedure: EXCISION OF PERIANAL  SKIN TAG;  Surgeon: Ralene Ok, MD;  Location: [place];  Service: General;  Laterality: N/A;   KNEE SURGERY  left   left ear surgery     for bad cut   LEFT HEART CATHETERIZATION WITH CORONARY ANGIOGRAM N/A 02/22/2013   Procedure: LEFT HEART CATHETERIZATION WITH CORONARY ANGIOGRAM;  Surgeon: [name], MD;  Location: [facility];  Service: Cardiovascular;  Laterality: N/A;   REVERSE SHOULDER ARTHROPLASTY Right 04/07/2022   Procedure: RIGHT SHOULDER REVERSE SHOULDER ARTHROPLASTY;  Surgeon: Vanetta Mulders, MD;  Location: [place];  Service: Orthopedics;  Laterality: Right;   SHOULDER SURGERY Bilateral    rotator cuff   TOE SURGERY Left    toe surgery   Patient Active Problem List   Diagnosis Date Noted  Rotator cuff arthropathy of right shoulder    Carpal tunnel syndrome, right upper limb 01/03/2022   Chest pain 06/21/2021   Variant angina ([place]) 01/24/2017   Precordial chest pain 01/23/2017   Headache 01/23/2017   S/P angioplasty with stent 01/23/2017   Coronary artery disease 11/24/2014   Syncope 10/22/2014   Hyperglycemia 04/20/2014   H/O hiatal hernia 04/19/2014   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    Coronary artery vasospasm ([place]) 09/26/2013   HTN (hypertension) 12/08/2011   GERD (gastroesophageal reflux disease) 12/08/2011   Depression 12/08/2011   Dyslipidemia 12/08/2011     REFERRING PROVIDER: Vanetta Mulders MD  REFERRING DIAG: s/p Rt reverse TSA + biceps tenodesis, without subscap repair    THERAPY DIAG:  Acute pain of right  shoulder  Stiffness of right shoulder, not elsewhere classified  Muscle weakness (generalized)  Rationale for Evaluation and Treatment Rehabilitation  ONSET DATE: DOS 04/07/22  SUBJECTIVE:                                                                                                                                                                                      SUBJECTIVE STATEMENT: Pt is 21 weeks s/p R reverse TSA and biceps tenodesis.  "It's getting better".  Pt reports limitations with reaching including reaching overhead.  She has pain in R wrist and hand.  Pt reports difficulty with holding objects in R UE and also twisting a bottle cap.  Pt has difficulty with washing the L side of her body and the lower part of her leg with R UE.  She has pain and difficulty closing her car door.  She is able to reach into cabinet a little better.  Pt is compliant with HEP.  Pt denies pain currently.  Pt denies any adverse effects after prior Rx.  Pt reports she has pain with certain ways she moves her arm especially with ER.  Pt denies pain currently.     PERTINENT HISTORY: H/o bil RCR R carpal tunnel release in July 2023   PRECAUTIONS: Shoulder, reverse TSA without subscap repair  WEIGHT BEARING RESTRICTIONS Yes    FALLS:  Has patient fallen in last 6 months? No  LIVING ENVIRONMENT: Lives with: lives with their spouse No stairs  OCCUPATION: Not working  PLOF: Independent  PATIENT GOALS the use of my arm and hand back, gardening  OBJECTIVE:    TODAY'S TREATMENT:   Therapeutic Exercise: -Reviewed pain levels, response to prior Rx, and HEP compliance.   -Pt received R shoulder PROM in flexion, abd, ER, and IR per protocol ranges w/n pt and tissue tolerance.  Pt performed:  Pulleys x 20 each in flex, abd, and scaption  standing jobe's flexion with 1# 2x10, 1x6 reps  Standing scaption x10 AROM, x10 with 1#  Shelf reach 2x10   Supine rhythmic stab's at 90/60/120 deg 2x30  sec each  Supine serratus punch 3x10 2#  Supine ABC with 1# x1  Prone row with 2# 3x10  Standing wand IR 2x10  Standing ER with YTB 2x10      PATIENT EDUCATION: Education details:  POC, HEP, and exercise form/rationale.   Person educated: Patient and Spouse Education method: Explanation, Demonstration, Tactile cues, Verbal cues, and Handouts Education comprehension: verbalized understanding, returned demonstration, verbal cues required, tactile cues required, and needs further education   HOME EXERCISE PROGRAM: [id number]    ASSESSMENT:  CLINICAL IMPRESSION: Pt continues to have difficulty with performing ADLs, reaching, and IADLs though is improving.  Pt performed exercises well and is improving with form requiring decreased cuing.  Pt is improving with R UE strength as evidenced by performance of exercises with adding resistance.  Pt has improved tightness with PROM.  She responded well to Rx having no pain, just some stiffness after Rx.  Pt should continue to benefit from cont skilled PT services to address ongoing goals and to assist with restoring desired level of function.      OBJECTIVE IMPAIRMENTS decreased activity tolerance, decreased knowledge of condition, decreased ROM, decreased strength, increased muscle spasms, impaired flexibility, impaired UE functional use, improper body mechanics, postural dysfunction, and pain.   ACTIVITY LIMITATIONS carrying, lifting, sleeping, bed mobility, bathing, dressing, reach over head, and hygiene/grooming  PARTICIPATION LIMITATIONS: meal prep, cleaning, laundry, driving, shopping, and yard work  PERSONAL FACTORS Past/current experiences and see PMH  are also affecting patient's functional outcome.   REHAB POTENTIAL: Good  CLINICAL DECISION MAKING: Stable/uncomplicated  EVALUATION COMPLEXITY: Low   GOALS:  SHORT TERM GOALS: Target date: 05/09/2022   AROM flexion to 90 without incr pain and good scap control Baseline:see  chart Goal status: GOAL MET  2.  ER in scapular plane to 30 deg Baseline: see chart Goal status: GOAL MET  3.  Pain levels <=3/10 Baseline: see subj reports Goal status:  ONGOING  4.  D/c sling Baseline: as tolerated at 3 wk Goal status: GOAL MET   LONG TERM GOALS: Target date: 08/20/2022     1.  Pt will meet FOTO goal Baseline: see above Goal status: ONGOING Target date: 06/20/22  2.  AROM flexion to 130 without incr pain and good scap control Baseline:  Goal status:  PARTIALLY MET Target date: 09/18/2022  3.  Use of arm for ADLs pain <=3/10 Baseline:  Goal status: GOAL MET Target date: 05/23/2022   4.  Initiate functional IR behind back without incr pain Baseline: can begin at 6 weeks per post op protocol Goal status: GOAL MET Target date: 05/23/2022   5.  Pt will demo R shoulder AROM to be [facility] t/o for performance of ADLs and IADLs.  Goal status: PROGRESSING Target date: 09/18/22  6.  Pt will be able to perform her ADLs and IADLs including reaching activities without significant pain and difficulty.  Goal status: PROGRESSING Target date: 09/18/22    7.  Pt will be able to reach into an overhead cabinet without significant difficulty.  Goal status: ONGOING Target date: 08/01/22    PLAN: PT FREQUENCY: 2x/wk   PT DURATION: 5 weeks  PLANNED INTERVENTIONS: Therapeutic exercises, Therapeutic activity, Neuromuscular re-education, Balance training, Gait training, Patient/Family education, Self Care, Joint mobilization, Aquatic Therapy, Dry Needling, Electrical stimulation, Spinal mobilization, Cryotherapy, Moist  heat, scar mobilization, Taping, Vasopneumatic device, Manual therapy, and Re-evaluation  PLAN FOR NEXT SESSION: continue per Dr. Eddie Dibbles reverse TSA protocol.   Selinda Michaels III PT, DPT 09/02/22 5:05 PM

## 2022-09-01 ENCOUNTER — Ambulatory Visit (HOSPITAL_BASED_OUTPATIENT_CLINIC_OR_DEPARTMENT_OTHER): Payer: Medicare Other | Admitting: Physical Therapy

## 2022-09-01 ENCOUNTER — Encounter (HOSPITAL_BASED_OUTPATIENT_CLINIC_OR_DEPARTMENT_OTHER): Payer: Self-pay | Admitting: Physical Therapy

## 2022-09-01 DIAGNOSIS — M6281 Muscle weakness (generalized): Secondary | ICD-10-CM

## 2022-09-01 DIAGNOSIS — M25511 Pain in right shoulder: Secondary | ICD-10-CM | POA: Diagnosis not present

## 2022-09-01 DIAGNOSIS — M25611 Stiffness of right shoulder, not elsewhere classified: Secondary | ICD-10-CM | POA: Diagnosis not present

## 2022-09-03 ENCOUNTER — Ambulatory Visit (HOSPITAL_BASED_OUTPATIENT_CLINIC_OR_DEPARTMENT_OTHER): Payer: Medicare Other | Admitting: Physical Therapy

## 2022-09-03 ENCOUNTER — Encounter (HOSPITAL_BASED_OUTPATIENT_CLINIC_OR_DEPARTMENT_OTHER): Payer: Self-pay | Admitting: Physical Therapy

## 2022-09-03 DIAGNOSIS — M25511 Pain in right shoulder: Secondary | ICD-10-CM

## 2022-09-03 DIAGNOSIS — M25611 Stiffness of right shoulder, not elsewhere classified: Secondary | ICD-10-CM

## 2022-09-03 DIAGNOSIS — M6281 Muscle weakness (generalized): Secondary | ICD-10-CM

## 2022-09-03 NOTE — Therapy (Signed)
OUTPATIENT PHYSICAL THERAPY SHOULDER TREATMENT       Patient Name: [name] MRN: [id number] DOB:03-Jul-1946, 77 y.o., female Today's Date: 09/04/2022   PT End of Session - 09/03/22 0858     Visit Number 35    Number of Visits 40    Date for PT Re-Evaluation 09/18/22    Authorization Type UHC MCR    PT Start Time 0847    PT Stop Time 0930    PT Time Calculation (min) 43 min    Activity Tolerance Patient tolerated treatment well    Behavior During Therapy [facility] for tasks assessed/performed                         Past Medical History:  Diagnosis Date   Anemia    Anginal pain ([place])    last cp was last year   Anxiety    Arthritis    Blood transfusion    Chest pain 02/04/2016   Coronary artery disease    Coronary artery spasm, wth continued episodes of chest pain.  09/26/2013   Coronary vasospasm (H[facility])    Diabetes mellitus without complication (HCC)    type II   GERD (gastroesophageal reflux disease)    Headache    Hiatal hernia    Hypercholesteremia    Hypertension    NSTEMI (non-ST elevated myocardial infarction) ([place]) not sure   NSVT (nonsustained ventricular tachycardia) ([place]) 09/26/2013   Panic attack    Past Surgical History:  Procedure Laterality Date   ABDOMINAL HYSTERECTOMY     PARTIAL HYSTERECTOMY   ACDF N/A    C5-6 ACDF Dr. Sherwood Gambler   breast     right, tumor benign   BREAST EXCISIONAL BIOPSY Right    1961 (age 3) benign   CARDIAC CATHETERIZATION  2014   CARDIAC CATHETERIZATION  2012   CARDIAC CATHETERIZATION N/A 07/22/2016   Procedure: Left Heart Cath and Coronary Angiography;  Surgeon: Belva Crome, MD;  Location: [place] [facility];  Service: Cardiovascular;  Laterality: N/A;   cardiac stent  2012   to RCA   CHOLECYSTECTOMY N/A 10/02/2017   Procedure: LAPAROSCOPIC CHOLECYSTECTOMY;  Surgeon: Ralene Ok, MD;  Location: [place];  Service: General;  Laterality: N/A;   CORONARY ANGIOPLASTY     ESOPHAGEAL MANOMETRY N/A  10/29/2016   Procedure: ESOPHAGEAL MANOMETRY (EM);  Surgeon: Garlan Fair, MD;  [facility];  Service: Endoscopy;  Laterality: N/A;   ESOPHAGOGASTRODUODENOSCOPY (EGD) WITH PROPOFOL N/A 10/28/2016   Procedure: ESOPHAGOGASTRODUODENOSCOPY (EGD) WITH PROPOFOL;  Surgeon: Garlan Fair, MD;  [facility];  Service: Endoscopy;  Laterality: N/A;   EXCISION OF SKIN TAG N/A 07/29/2017   Procedure: EXCISION OF PERIANAL  SKIN TAG;  Surgeon: Ralene Ok, MD;  Location: [place];  Service: General;  Laterality: N/A;   KNEE SURGERY  left   left ear surgery     for bad cut   LEFT HEART CATHETERIZATION WITH CORONARY ANGIOGRAM N/A 02/22/2013   Procedure: LEFT HEART CATHETERIZATION WITH CORONARY ANGIOGRAM;  Surgeon: [name], MD;  Location: [facility];  Service: Cardiovascular;  Laterality: N/A;   REVERSE SHOULDER ARTHROPLASTY Right 04/07/2022   Procedure: RIGHT SHOULDER REVERSE SHOULDER ARTHROPLASTY;  Surgeon: Vanetta Mulders, MD;  Location: [place];  Service: Orthopedics;  Laterality: Right;   SHOULDER SURGERY Bilateral    rotator cuff   TOE SURGERY Left    toe surgery   Patient Active Problem List   Diagnosis Date Noted  Rotator cuff arthropathy of right shoulder    Carpal tunnel syndrome, right upper limb 01/03/2022   Chest pain 06/21/2021   Variant angina ([place]) 01/24/2017   Precordial chest pain 01/23/2017   Headache 01/23/2017   S/P angioplasty with stent 01/23/2017   Coronary artery disease 11/24/2014   Syncope 10/22/2014   Hyperglycemia 04/20/2014   H/O hiatal hernia 04/19/2014   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    Coronary artery vasospasm ([place]) 09/26/2013   HTN (hypertension) 12/08/2011   GERD (gastroesophageal reflux disease) 12/08/2011   Depression 12/08/2011   Dyslipidemia 12/08/2011     REFERRING PROVIDER: Vanetta Mulders MD  REFERRING DIAG: s/p Rt reverse TSA + biceps tenodesis, without subscap repair    THERAPY DIAG:  Acute pain of right  shoulder  Stiffness of right shoulder, not elsewhere classified  Muscle weakness (generalized)  Rationale for Evaluation and Treatment Rehabilitation  ONSET DATE: DOS 04/07/22  SUBJECTIVE:                                                                                                                                                                                      SUBJECTIVE STATEMENT: Pt is 21 weeks and 2 days s/p R reverse TSA and biceps tenodesis.  Pt states she felt ok after prior Rx and had some pain later in the evening.  Pt used ice which made shoulder feel better.  Pt reports limitations with reaching including reaching overhead.  Pt has difficulty with washing the L side of her body and the lower part of her leg with R UE.  She has pain and difficulty closing her car door.  She is able to reach into cabinet a little better.  Pt is compliant with HEP.  Pt reports it's a little easier cutting her food.  Pt states she's not having pain this AM, mainly soreness and a nagging ache.  Pt reports having stiffness.     PERTINENT HISTORY: H/o bil RCR R carpal tunnel release in July 2023   PRECAUTIONS: Shoulder, reverse TSA without subscap repair  WEIGHT BEARING RESTRICTIONS Yes    FALLS:  Has patient fallen in last 6 months? No  LIVING ENVIRONMENT: Lives with: lives with their spouse No stairs  OCCUPATION: Not working  PLOF: Independent  PATIENT GOALS the use of my arm and hand back, gardening  OBJECTIVE:    TODAY'S TREATMENT:   Therapeutic Exercise: -Reviewed pain levels, response to prior Rx, and HEP compliance.   -Pt received R shoulder PROM in flexion, abd, ER, and IR per protocol ranges w/n pt and tissue tolerance.  Pt performed:  Pulleys x 20 each in flex, abd, and scaption  standing jobe's flexion with  1# 3x10 reps  Standing scaption 2x10 AROM, x5 with 1#  Shelf reach 2x10   Standing wall walks in flexion 2x5 reps  Supine rhythmic stab's at 90/60/120 deg  2x30 sec each  Supine serratus punch 3x10 2#  Supine ABC with 1# x1  Prone row with 2# 3x10  Standing ER with YTB 3x10    PATIENT EDUCATION: Education details:  POC, HEP, and exercise form/rationale.   Person educated: Patient and Spouse Education method: Explanation, Demonstration, Tactile cues, Verbal cues, and Handouts Education comprehension: verbalized understanding, returned demonstration, verbal cues required, tactile cues required, and needs further education   HOME EXERCISE PROGRAM: [id number]    ASSESSMENT:  CLINICAL IMPRESSION: Pt continues to have difficulty with performing ADLs, reaching, and IADLs though is improving.  Pt performed exercises well and is improving with form requiring decreased cuing.  Pt is improving with R UE strength as evidenced by performance of resistance exercises.  Pt has improved performance of shelf reach.  Pt is compliant with HEP.   Pt has improved tightness with PROM though continues to have some limitations with Abd and ER.  Pt has some pain with ER PROM.  She responded well to Rx having no pain, just some soreness after Rx.  Pt should continue to benefit from cont skilled PT services to address ongoing goals and to assist with restoring desired level of function.      OBJECTIVE IMPAIRMENTS decreased activity tolerance, decreased knowledge of condition, decreased ROM, decreased strength, increased muscle spasms, impaired flexibility, impaired UE functional use, improper body mechanics, postural dysfunction, and pain.   ACTIVITY LIMITATIONS carrying, lifting, sleeping, bed mobility, bathing, dressing, reach over head, and hygiene/grooming  PARTICIPATION LIMITATIONS: meal prep, cleaning, laundry, driving, shopping, and yard work  PERSONAL FACTORS Past/current experiences and see PMH  are also affecting patient's functional outcome.   REHAB POTENTIAL: Good  CLINICAL DECISION MAKING: Stable/uncomplicated  EVALUATION COMPLEXITY:  Low   GOALS:  SHORT TERM GOALS: Target date: 05/09/2022   AROM flexion to 90 without incr pain and good scap control Baseline:see chart Goal status: GOAL MET  2.  ER in scapular plane to 30 deg Baseline: see chart Goal status: GOAL MET  3.  Pain levels <=3/10 Baseline: see subj reports Goal status:  ONGOING  4.  D/c sling Baseline: as tolerated at 3 wk Goal status: GOAL MET   LONG TERM GOALS: Target date: 08/20/2022     1.  Pt will meet FOTO goal Baseline: see above Goal status: ONGOING Target date: 06/20/22  2.  AROM flexion to 130 without incr pain and good scap control Baseline:  Goal status:  PARTIALLY MET Target date: 09/18/2022  3.  Use of arm for ADLs pain <=3/10 Baseline:  Goal status: GOAL MET Target date: 05/23/2022   4.  Initiate functional IR behind back without incr pain Baseline: can begin at 6 weeks per post op protocol Goal status: GOAL MET Target date: 05/23/2022   5.  Pt will demo R shoulder AROM to be [facility] t/o for performance of ADLs and IADLs.  Goal status: PROGRESSING Target date: 09/18/22  6.  Pt will be able to perform her ADLs and IADLs including reaching activities without significant pain and difficulty.  Goal status: PROGRESSING Target date: 09/18/22    7.  Pt will be able to reach into an overhead cabinet without significant difficulty.  Goal status: ONGOING Target date: 08/01/22    PLAN: PT FREQUENCY: 2x/wk   PT DURATION: 5 weeks  PLANNED  INTERVENTIONS: Therapeutic exercises, Therapeutic activity, Neuromuscular re-education, Balance training, Gait training, Patient/Family education, Self Care, Joint mobilization, Aquatic Therapy, Dry Needling, Electrical stimulation, Spinal mobilization, Cryotherapy, Moist heat, scar mobilization, Taping, Vasopneumatic device, Manual therapy, and Re-evaluation  PLAN FOR NEXT SESSION: continue per Dr. Eddie Dibbles reverse TSA protocol.   Selinda Michaels III PT, DPT 09/04/22 9:04 AM

## 2022-09-09 ENCOUNTER — Encounter (HOSPITAL_BASED_OUTPATIENT_CLINIC_OR_DEPARTMENT_OTHER): Payer: Self-pay | Admitting: Physical Therapy

## 2022-09-09 ENCOUNTER — Ambulatory Visit (HOSPITAL_BASED_OUTPATIENT_CLINIC_OR_DEPARTMENT_OTHER): Payer: Medicare Other | Admitting: Physical Therapy

## 2022-09-09 DIAGNOSIS — M25611 Stiffness of right shoulder, not elsewhere classified: Secondary | ICD-10-CM | POA: Diagnosis not present

## 2022-09-09 DIAGNOSIS — M6281 Muscle weakness (generalized): Secondary | ICD-10-CM | POA: Diagnosis not present

## 2022-09-09 DIAGNOSIS — M25511 Pain in right shoulder: Secondary | ICD-10-CM | POA: Diagnosis not present

## 2022-09-09 NOTE — Therapy (Signed)
OUTPATIENT PHYSICAL THERAPY SHOULDER TREATMENT       Patient Name: [name] MRN: [id number] DOB:21-Apr-1946, 77 y.o., female Today's Date: 09/09/2022   PT End of Session - 09/09/22 1352     Visit Number 36    Number of Visits 40    Date for PT Re-Evaluation 09/18/22    Authorization Type UHC MCR    Progress Note Due on Visit 29    PT Start Time 1305    PT Stop Time 1350    PT Time Calculation (min) 45 min    Activity Tolerance Patient tolerated treatment well    Behavior During Therapy WFL for tasks assessed/performed                          Past Medical History:  Diagnosis Date   Anemia    Anginal pain ([place])    last cp was last year   Anxiety    Arthritis    Blood transfusion    Chest pain 02/04/2016   Coronary artery disease    Coronary artery spasm, wth continued episodes of chest pain.  09/26/2013   Coronary vasospasm (HCC)    Diabetes mellitus without complication (HCC)    type II   GERD (gastroesophageal reflux disease)    Headache    Hiatal hernia    Hypercholesteremia    Hypertension    NSTEMI (non-ST elevated myocardial infarction) ([place]) not sure   NSVT (nonsustained ventricular tachycardia) ([place]) 09/26/2013   Panic attack    Past Surgical History:  Procedure Laterality Date   ABDOMINAL HYSTERECTOMY     PARTIAL HYSTERECTOMY   ACDF N/A    C5-6 ACDF Dr. Sherwood Gambler   breast     right, tumor benign   BREAST EXCISIONAL BIOPSY Right    1961 (age 2) benign   CARDIAC CATHETERIZATION  2014   CARDIAC CATHETERIZATION  2012   CARDIAC CATHETERIZATION N/A 07/22/2016   Procedure: Left Heart Cath and Coronary Angiography;  Surgeon: Belva Crome, MD;  Location: [place] [facility];  Service: Cardiovascular;  Laterality: N/A;   cardiac stent  2012   to RCA   CHOLECYSTECTOMY N/A 10/02/2017   Procedure: LAPAROSCOPIC CHOLECYSTECTOMY;  Surgeon: Ralene Ok, MD;  Location: [place];  Service: General;  Laterality: N/A;   CORONARY ANGIOPLASTY      ESOPHAGEAL MANOMETRY N/A 10/29/2016   Procedure: ESOPHAGEAL MANOMETRY (EM);  Surgeon: Garlan Fair, MD;  [facility];  Service: Endoscopy;  Laterality: N/A;   ESOPHAGOGASTRODUODENOSCOPY (EGD) WITH PROPOFOL N/A 10/28/2016   Procedure: ESOPHAGOGASTRODUODENOSCOPY (EGD) WITH PROPOFOL;  Surgeon: Garlan Fair, MD;  [facility];  Service: Endoscopy;  Laterality: N/A;   EXCISION OF SKIN TAG N/A 07/29/2017   Procedure: EXCISION OF PERIANAL  SKIN TAG;  Surgeon: Ralene Ok, MD;  Location: [place];  Service: General;  Laterality: N/A;   KNEE SURGERY  left   left ear surgery     for bad cut   LEFT HEART CATHETERIZATION WITH CORONARY ANGIOGRAM N/A 02/22/2013   Procedure: LEFT HEART CATHETERIZATION WITH CORONARY ANGIOGRAM;  Surgeon: [name], MD;  Location: [facility] CATH LAB;  Service: Cardiovascular;  Laterality: N/A;   REVERSE SHOULDER ARTHROPLASTY Right 04/07/2022   Procedure: RIGHT SHOULDER REVERSE SHOULDER ARTHROPLASTY;  Surgeon: Vanetta Mulders, MD;  Location: [place];  Service: Orthopedics;  Laterality: Right;   SHOULDER SURGERY Bilateral    rotator cuff   TOE SURGERY Left    toe surgery  Patient Active Problem List   Diagnosis Date Noted   Rotator cuff arthropathy of right shoulder    Carpal tunnel syndrome, right upper limb 01/03/2022   Chest pain 06/21/2021   Variant angina ([place]) 01/24/2017   Precordial chest pain 01/23/2017   Headache 01/23/2017   S/P angioplasty with stent 01/23/2017   Coronary artery disease 11/24/2014   Syncope 10/22/2014   Hyperglycemia 04/20/2014   H/O hiatal hernia 04/19/2014   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    Coronary artery vasospasm ([place]) 09/26/2013   HTN (hypertension) 12/08/2011   GERD (gastroesophageal reflux disease) 12/08/2011   Depression 12/08/2011   Dyslipidemia 12/08/2011     REFERRING PROVIDER: Vanetta Mulders MD  REFERRING DIAG: s/p Rt reverse TSA + biceps tenodesis, without subscap repair    THERAPY DIAG:  Acute pain  of right shoulder  Stiffness of right shoulder, not elsewhere classified  Muscle weakness (generalized)  Rationale for Evaluation and Treatment Rehabilitation  ONSET DATE: DOS 04/07/22  SUBJECTIVE:                                                                                                                                                                                      SUBJECTIVE STATEMENT: Pt is 22 weeks and 1 day s/p R reverse TSA and biceps tenodesis.  Pt denies any adverse effects after prior Rx.  Pt reports her shoulder was a little stiff and she used ice.  Pt reports no pain today.  She does have some tenderness and stinging in anterior R shoulder.  Pt has pain with certain ways she moves it and feels a shooting pain.  Pt reports improved lifting R UE.   Pt reports her arm feels heavy at night while she is lying in bed.  She performs shoulder and elbow AROM which makes it feel better.  She has pain and difficulty closing her car door though is getting a little better.  She is able to reach into cabinet a little better.  Pt has difficulty doffing a shirt.  Pt has difficulty with washing the L side of her body and the lower part of her leg with R UE.     Pt is compliant with HEP.     PERTINENT HISTORY: H/o bil RCR R carpal tunnel release in July 2023   PRECAUTIONS: Shoulder, reverse TSA without subscap repair  WEIGHT BEARING RESTRICTIONS Yes    FALLS:  Has patient fallen in last 6 months? No  LIVING ENVIRONMENT: Lives with: lives with their spouse No stairs  OCCUPATION: Not working  PLOF: Independent  PATIENT GOALS the use of my arm and hand back, gardening  OBJECTIVE:    TODAY'S TREATMENT:  Therapeutic Exercise: -Reviewed pain levels, response to prior Rx, and HEP compliance.   -Pt received R shoulder PROM in flexion, abd, ER, and IR per protocol ranges w/n pt and tissue tolerance.  Pt performed:  standing jobe's flexion with 1# 2x10 reps  Standing  scaption 2x10 AROM  Shelf reach x10   Standing wall walks in flexion 2x5 reps  Standing wall walks in scaption x 4 reps  Supine rhythmic stab's at 90/60/120 deg 2x30 sec each  Supine serratus punch 2x10 2#, 1x10 3#  Supine ABC with 1# x1  S/L ER x10, 1# x 10  Prone row with 2# 3x10      PATIENT EDUCATION: Education details:  POC, HEP, and exercise form/rationale.   Person educated: Patient and Spouse Education method: Explanation, Demonstration, Tactile cues, Verbal cues, and Handouts Education comprehension: verbalized understanding, returned demonstration, verbal cues required, tactile cues required, and needs further education   HOME EXERCISE PROGRAM: [email or web address]    ASSESSMENT:  CLINICAL IMPRESSION: Pt continues to have difficulty with performing ADLs, reaching, and IADLs though is improving as evidenced by subjective reports.  Pt is improving with strength in R shoulder though continues to have strength deficits as evidenced by performance of exercises.  Pt required cuing for correct form with resisted standing flexion.  Pt performed in front of mirror to decrease shoulder hike and improve form.  Pt was unable to perform standing scaption with 1#.  She had limited ROM and increased shoulder hike.  PT removed weight and had pt perform without weight.  Pt has improved tightness with PROM though continues to have some limitations and also pain at end ranges with Abd and ER.  She responded well to Rx having no pain though was fatigued after Rx.  Pt should continue to benefit from cont skilled PT services to address ongoing goals and to assist with restoring desired level of function.     OBJECTIVE IMPAIRMENTS decreased activity tolerance, decreased knowledge of condition, decreased ROM, decreased strength, increased muscle spasms, impaired flexibility, impaired UE functional use, improper body mechanics, postural dysfunction, and pain.   ACTIVITY LIMITATIONS carrying, lifting, sleeping,  bed mobility, bathing, dressing, reach over head, and hygiene/grooming  PARTICIPATION LIMITATIONS: meal prep, cleaning, laundry, driving, shopping, and yard work  PERSONAL FACTORS Past/current experiences and see PMH  are also affecting patient's functional outcome.   REHAB POTENTIAL: Good  CLINICAL DECISION MAKING: Stable/uncomplicated  EVALUATION COMPLEXITY: Low   GOALS:  SHORT TERM GOALS: Target date: 05/09/2022   AROM flexion to 90 without incr pain and good scap control Baseline:see chart Goal status: GOAL MET  2.  ER in scapular plane to 30 deg Baseline: see chart Goal status: GOAL MET  3.  Pain levels <=3/10 Baseline: see subj reports Goal status:  ONGOING  4.  D/c sling Baseline: as tolerated at 3 wk Goal status: GOAL MET   LONG TERM GOALS: Target date: 08/20/2022     1.  Pt will meet FOTO goal Baseline: see above Goal status: ONGOING Target date: 06/20/22  2.  AROM flexion to 130 without incr pain and good scap control Baseline:  Goal status:  PARTIALLY MET Target date: 09/18/2022  3.  Use of arm for ADLs pain <=3/10 Baseline:  Goal status: GOAL MET Target date: 05/23/2022   4.  Initiate functional IR behind back without incr pain Baseline: can begin at 6 weeks per post op protocol Goal status: GOAL MET Target date: 05/23/2022   5.  Pt will demo R shoulder  AROM to be [facility] t/o for performance of ADLs and IADLs.  Goal status: PROGRESSING Target date: 09/18/22  6.  Pt will be able to perform her ADLs and IADLs including reaching activities without significant pain and difficulty.  Goal status: PROGRESSING Target date: 09/18/22    7.  Pt will be able to reach into an overhead cabinet without significant difficulty.  Goal status: ONGOING Target date: 08/01/22    PLAN: PT FREQUENCY: 2x/wk   PT DURATION: 5 weeks  PLANNED INTERVENTIONS: Therapeutic exercises, Therapeutic activity, Neuromuscular re-education, Balance training, Gait training,  Patient/Family education, Self Care, Joint mobilization, Aquatic Therapy, Dry Needling, Electrical stimulation, Spinal mobilization, Cryotherapy, Moist heat, scar mobilization, Taping, Vasopneumatic device, Manual therapy, and Re-evaluation  PLAN FOR NEXT SESSION: continue per Dr. Eddie Dibbles reverse TSA protocol.   Selinda Michaels III PT, DPT 09/09/22 4:51 PM

## 2022-09-10 DIAGNOSIS — E118 Type 2 diabetes mellitus with unspecified complications: Secondary | ICD-10-CM | POA: Diagnosis not present

## 2022-09-12 ENCOUNTER — Encounter (HOSPITAL_BASED_OUTPATIENT_CLINIC_OR_DEPARTMENT_OTHER): Payer: Self-pay | Admitting: Physical Therapy

## 2022-09-12 ENCOUNTER — Ambulatory Visit (HOSPITAL_BASED_OUTPATIENT_CLINIC_OR_DEPARTMENT_OTHER): Payer: Medicare Other | Attending: Orthopaedic Surgery | Admitting: Physical Therapy

## 2022-09-12 DIAGNOSIS — M6281 Muscle weakness (generalized): Secondary | ICD-10-CM | POA: Diagnosis not present

## 2022-09-12 DIAGNOSIS — M25611 Stiffness of right shoulder, not elsewhere classified: Secondary | ICD-10-CM | POA: Diagnosis not present

## 2022-09-12 DIAGNOSIS — M25511 Pain in right shoulder: Secondary | ICD-10-CM

## 2022-09-12 NOTE — Therapy (Signed)
OUTPATIENT PHYSICAL THERAPY SHOULDER TREATMENT       Patient Name: [name] MRN: [id number] DOB:Aug 23, 1945, 77 y.o., female Today's Date: 09/12/2022   PT End of Session - 09/12/22 0903     Visit Number 37    Number of Visits 40    Date for PT Re-Evaluation 09/18/22    Authorization Type UHC MCR    PT Start Time 0852    PT Stop Time 0937    PT Time Calculation (min) 45 min    Activity Tolerance Patient tolerated treatment well    Behavior During Therapy [facility] for tasks assessed/performed                           Past Medical History:  Diagnosis Date   Anemia    Anginal pain ([place])    last cp was last year   Anxiety    Arthritis    Blood transfusion    Chest pain 02/04/2016   Coronary artery disease    Coronary artery spasm, wth continued episodes of chest pain.  09/26/2013   Coronary vasospasm ([facility])    Diabetes mellitus without complication (HCC)    type II   GERD (gastroesophageal reflux disease)    Headache    Hiatal hernia    Hypercholesteremia    Hypertension    NSTEMI (non-ST elevated myocardial infarction) ([place]) not sure   NSVT (nonsustained ventricular tachycardia) ([place]) 09/26/2013   Panic attack    Past Surgical History:  Procedure Laterality Date   ABDOMINAL HYSTERECTOMY     PARTIAL HYSTERECTOMY   ACDF N/A    C5-6 ACDF Dr. Sherwood Gambler   breast     right, tumor benign   BREAST EXCISIONAL BIOPSY Right    1961 (age 71) benign   CARDIAC CATHETERIZATION  2014   CARDIAC CATHETERIZATION  2012   CARDIAC CATHETERIZATION N/A 07/22/2016   Procedure: Left Heart Cath and Coronary Angiography;  Surgeon: Belva Crome, MD;  Location: [place] CV LAB;  Service: Cardiovascular;  Laterality: N/A;   cardiac stent  2012   to RCA   CHOLECYSTECTOMY N/A 10/02/2017   Procedure: LAPAROSCOPIC CHOLECYSTECTOMY;  Surgeon: Ralene Ok, MD;  Location: [place];  Service: General;  Laterality: N/A;   CORONARY ANGIOPLASTY     ESOPHAGEAL MANOMETRY N/A  10/29/2016   Procedure: ESOPHAGEAL MANOMETRY (EM);  Surgeon: Garlan Fair, MD;  [facility]: [facility];  Service: Endoscopy;  Laterality: N/A;   ESOPHAGOGASTRODUODENOSCOPY (EGD) WITH PROPOFOL N/A 10/28/2016   Procedure: ESOPHAGOGASTRODUODENOSCOPY (EGD) WITH PROPOFOL;  Surgeon: Garlan Fair, MD;  Location: [facility];  Service: Endoscopy;  Laterality: N/A;   EXCISION OF SKIN TAG N/A 07/29/2017   Procedure: EXCISION OF PERIANAL  SKIN TAG;  Surgeon: Ralene Ok, MD;  Location: [place];  Service: General;  Laterality: N/A;   KNEE SURGERY  left   left ear surgery     for bad cut   LEFT HEART CATHETERIZATION WITH CORONARY ANGIOGRAM N/A 02/22/2013   Procedure: LEFT HEART CATHETERIZATION WITH CORONARY ANGIOGRAM;  Surgeon: [name], MD;  Location: [facility] CATH [facility];  Service: Cardiovascular;  Laterality: N/A;   REVERSE SHOULDER ARTHROPLASTY Right 04/07/2022   Procedure: RIGHT SHOULDER REVERSE SHOULDER ARTHROPLASTY;  Surgeon: Vanetta Mulders, MD;  Location: [place];  Service: Orthopedics;  Laterality: Right;   SHOULDER SURGERY Bilateral    rotator cuff   TOE SURGERY Left    toe surgery   Patient Active Problem List   Diagnosis  Date Noted   Rotator cuff arthropathy of right shoulder    Carpal tunnel syndrome, right upper limb 01/03/2022   Chest pain 06/21/2021   Variant angina ([place]) 01/24/2017   Precordial chest pain 01/23/2017   Headache 01/23/2017   S/P angioplasty with stent 01/23/2017   Coronary artery disease 11/24/2014   Syncope 10/22/2014   Hyperglycemia 04/20/2014   H/O hiatal hernia 04/19/2014   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    Coronary artery vasospasm ([place]) 09/26/2013   HTN (hypertension) 12/08/2011   GERD (gastroesophageal reflux disease) 12/08/2011   Depression 12/08/2011   Dyslipidemia 12/08/2011     REFERRING PROVIDER: Vanetta Mulders MD  REFERRING DIAG: s/p Rt reverse TSA + biceps tenodesis, without subscap repair    THERAPY DIAG:  Acute pain of right  shoulder  Stiffness of right shoulder, not elsewhere classified  Muscle weakness (generalized)  Rationale for Evaluation and Treatment Rehabilitation  ONSET DATE: DOS 04/07/22  SUBJECTIVE:                                                                                                                                                                                      SUBJECTIVE STATEMENT: Pt is 22 weeks and 4 days s/p R reverse TSA and biceps tenodesis.  Pt denies any adverse effects after prior Rx.   Pt reports compliance with HEP.  Pt reports no pain today, just some stiffness.  She states she has some pain and stiffness with ER motion.  She has pain and difficulty closing her car door though is getting a little better.  She is able to reach into cabinet a little better.  Pt has difficulty doffing a shirt.  Pt has difficulty with washing the L side of her body and the lower part of her leg with R UE.      PERTINENT HISTORY: H/o bil RCR R carpal tunnel release in July 2023   PRECAUTIONS: Shoulder, reverse TSA without subscap repair  WEIGHT BEARING RESTRICTIONS Yes    FALLS:  Has patient fallen in last 6 months? No  LIVING ENVIRONMENT: Lives with: lives with their spouse No stairs  OCCUPATION: Not working  PLOF: Independent  PATIENT GOALS the use of my arm and hand back, gardening  OBJECTIVE:    TODAY'S TREATMENT:   Therapeutic Exercise: -Reviewed pain levels, response to prior Rx, and HEP compliance.   -Pt received R shoulder PROM in flexion, abd, ER, and IR per protocol ranges w/n pt and tissue tolerance.  Pt performed:  Pulleys in flex, scaption, and abd x 20 reps each  standing jobe's flexion with 1# 2x10 reps, 2# x10  Standing scaption 3x10 1#  Shelf reach 2x10  Standing wall walks in flexion x5 reps  Standing wall walks in scaption x 5 reps  Supine serratus punch 3x10 3#  S/L ER 1# 2 x 10    Neuro Re-ed Activities:  Supine D2 flexion 2x10  Supine  shoulder ABC with 1#  1 rep  Supine rhythmic stab's at 90/60/120 deg 2x30 sec each      PATIENT EDUCATION: Education details:  POC, HEP, and exercise form/rationale.   Person educated: Patient and Spouse Education method: Explanation, Demonstration, Tactile cues, Verbal cues, and Handouts Education comprehension: verbalized understanding, returned demonstration, verbal cues required, tactile cues required, and needs further education   HOME EXERCISE PROGRAM: [id number]    ASSESSMENT:  CLINICAL IMPRESSION: Pt is improving with shoulder ROM having improved PROM and reduced tightness with PROM.  She does have continued tightness and limitations with ER PROM.  Pt is improving with R shoulder strength as evidenced by performance of exercises today.  She was able to perform standing flex and scaption with improved form and increased resistance/reps.  She also had improved form and ROM with shelf reach.  She did fatigue with standing wall AAROM and Pt performed limited reps.  Pt responded well to Rx having no c/o's after Rx.  Her R UE was fatigued with Rx.   Pt should continue to benefit from cont skilled PT services to address ongoing goals and to assist with restoring desired level of function.     OBJECTIVE IMPAIRMENTS decreased activity tolerance, decreased knowledge of condition, decreased ROM, decreased strength, increased muscle spasms, impaired flexibility, impaired UE functional use, improper body mechanics, postural dysfunction, and pain.   ACTIVITY LIMITATIONS carrying, lifting, sleeping, bed mobility, bathing, dressing, reach over head, and hygiene/grooming  PARTICIPATION LIMITATIONS: meal prep, cleaning, laundry, driving, shopping, and yard work  PERSONAL FACTORS Past/current experiences and see PMH  are also affecting patient's functional outcome.   REHAB POTENTIAL: Good  CLINICAL DECISION MAKING: Stable/uncomplicated  EVALUATION COMPLEXITY: Low   GOALS:  SHORT TERM  GOALS: Target date: 05/09/2022   AROM flexion to 90 without incr pain and good scap control Baseline:see chart Goal status: GOAL MET  2.  ER in scapular plane to 30 deg Baseline: see chart Goal status: GOAL MET  3.  Pain levels <=3/10 Baseline: see subj reports Goal status:  ONGOING  4.  D/c sling Baseline: as tolerated at 3 wk Goal status: GOAL MET   LONG TERM GOALS: Target date: 08/20/2022     1.  Pt will meet FOTO goal Baseline: see above Goal status: ONGOING Target date: 06/20/22  2.  AROM flexion to 130 without incr pain and good scap control Baseline:  Goal status:  PARTIALLY MET Target date: 09/18/2022  3.  Use of arm for ADLs pain <=3/10 Baseline:  Goal status: GOAL MET Target date: 05/23/2022   4.  Initiate functional IR behind back without incr pain Baseline: can begin at 6 weeks per post op protocol Goal status: GOAL MET Target date: 05/23/2022   5.  Pt will demo R shoulder AROM to be [facility] t/o for performance of ADLs and IADLs.  Goal status: PROGRESSING Target date: 09/18/22  6.  Pt will be able to perform her ADLs and IADLs including reaching activities without significant pain and difficulty.  Goal status: PROGRESSING Target date: 09/18/22    7.  Pt will be able to reach into an overhead cabinet without significant difficulty.  Goal status: ONGOING Target date: 08/01/22    PLAN: PT FREQUENCY: 2x/wk  PT DURATION: 5 weeks  PLANNED INTERVENTIONS: Therapeutic exercises, Therapeutic activity, Neuromuscular re-education, Balance training, Gait training, Patient/Family education, Self Care, Joint mobilization, Aquatic Therapy, Dry Needling, Electrical stimulation, Spinal mobilization, Cryotherapy, Moist heat, scar mobilization, Taping, Vasopneumatic device, Manual therapy, and Re-evaluation  PLAN FOR NEXT SESSION: continue per Dr. Eddie Dibbles reverse TSA protocol.   Selinda Michaels III PT, DPT 09/12/22 10:19 AM

## 2022-09-16 ENCOUNTER — Ambulatory Visit (HOSPITAL_BASED_OUTPATIENT_CLINIC_OR_DEPARTMENT_OTHER): Payer: Medicare Other | Admitting: Physical Therapy

## 2022-09-16 ENCOUNTER — Encounter (HOSPITAL_BASED_OUTPATIENT_CLINIC_OR_DEPARTMENT_OTHER): Payer: Self-pay | Admitting: Physical Therapy

## 2022-09-16 DIAGNOSIS — M25511 Pain in right shoulder: Secondary | ICD-10-CM | POA: Diagnosis not present

## 2022-09-16 DIAGNOSIS — M25611 Stiffness of right shoulder, not elsewhere classified: Secondary | ICD-10-CM

## 2022-09-16 DIAGNOSIS — M6281 Muscle weakness (generalized): Secondary | ICD-10-CM

## 2022-09-16 NOTE — Therapy (Signed)
OUTPATIENT PHYSICAL THERAPY SHOULDER TREATMENT       Patient Name: [name] MRN: [id number] DOB:02-06-1946, 77 y.o., female Today's Date: 09/17/2022   PT End of Session - 09/16/22 0942     Visit Number 38    Number of Visits 40    Date for PT Re-Evaluation 09/18/22    Authorization Type UHC MCR    PT Start Time 7253    PT Stop Time 1020    PT Time Calculation (min) 45 min    Activity Tolerance Patient tolerated treatment well    Behavior During Therapy WFL for tasks assessed/performed                           Past Medical History:  Diagnosis Date   Anemia    Anginal pain ([place])    last cp was last year   Anxiety    Arthritis    Blood transfusion    Chest pain 02/04/2016   Coronary artery disease    Coronary artery spasm, wth continued episodes of chest pain.  09/26/2013   Coronary vasospasm (HCC)    Diabetes mellitus without complication (HCC)    type II   GERD (gastroesophageal reflux disease)    Headache    Hiatal hernia    Hypercholesteremia    Hypertension    NSTEMI (non-ST elevated myocardial infarction) ([place]) not sure   NSVT (nonsustained ventricular tachycardia) ([place]) 09/26/2013   Panic attack    Past Surgical History:  Procedure Laterality Date   ABDOMINAL HYSTERECTOMY     PARTIAL HYSTERECTOMY   ACDF N/A    C5-6 ACDF Dr. Sherwood Gambler   breast     right, tumor benign   BREAST EXCISIONAL BIOPSY Right    1961 (age 60) benign   CARDIAC CATHETERIZATION  2014   CARDIAC CATHETERIZATION  2012   CARDIAC CATHETERIZATION N/A 07/22/2016   Procedure: Left Heart Cath and Coronary Angiography;  Surgeon: Belva Crome, MD;  Location: [place] CV LAB;  Service: Cardiovascular;  Laterality: N/A;   cardiac stent  2012   to RCA   CHOLECYSTECTOMY N/A 10/02/2017   Procedure: LAPAROSCOPIC CHOLECYSTECTOMY;  Surgeon: Ralene Ok, MD;  Location: [place];  Service: General;  Laterality: N/A;   CORONARY ANGIOPLASTY     ESOPHAGEAL MANOMETRY N/A  10/29/2016   Procedure: ESOPHAGEAL MANOMETRY (EM);  Surgeon: Garlan Fair, MD;  [facility]: [facility];  Service: Endoscopy;  Laterality: N/A;   ESOPHAGOGASTRODUODENOSCOPY (EGD) WITH PROPOFOL N/A 10/28/2016   Procedure: ESOPHAGOGASTRODUODENOSCOPY (EGD) WITH PROPOFOL;  Surgeon: Garlan Fair, MD;  [facility];  Service: Endoscopy;  Laterality: N/A;   EXCISION OF SKIN TAG N/A 07/29/2017   Procedure: EXCISION OF PERIANAL  SKIN TAG;  Surgeon: Ralene Ok, MD;  Location: [place];  Service: General;  Laterality: N/A;   KNEE SURGERY  left   left ear surgery     for bad cut   LEFT HEART CATHETERIZATION WITH CORONARY ANGIOGRAM N/A 02/22/2013   Procedure: LEFT HEART CATHETERIZATION WITH CORONARY ANGIOGRAM;  Surgeon: [name], MD;  Location: [facility] CATH LAB;  Service: Cardiovascular;  Laterality: N/A;   REVERSE SHOULDER ARTHROPLASTY Right 04/07/2022   Procedure: RIGHT SHOULDER REVERSE SHOULDER ARTHROPLASTY;  Surgeon: Vanetta Mulders, MD;  Location: [place];  Service: Orthopedics;  Laterality: Right;   SHOULDER SURGERY Bilateral    rotator cuff   TOE SURGERY Left    toe surgery   Patient Active Problem List   Diagnosis  Date Noted   Rotator cuff arthropathy of right shoulder    Carpal tunnel syndrome, right upper limb 01/03/2022   Chest pain 06/21/2021   Variant angina ([place]) 01/24/2017   Precordial chest pain 01/23/2017   Headache 01/23/2017   S/P angioplasty with stent 01/23/2017   Coronary artery disease 11/24/2014   Syncope 10/22/2014   Hyperglycemia 04/20/2014   H/O hiatal hernia 04/19/2014   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    Coronary artery vasospasm ([place]) 09/26/2013   HTN (hypertension) 12/08/2011   GERD (gastroesophageal reflux disease) 12/08/2011   Depression 12/08/2011   Dyslipidemia 12/08/2011     REFERRING PROVIDER: Vanetta Mulders MD  REFERRING DIAG: s/p Rt reverse TSA + biceps tenodesis, without subscap repair    THERAPY DIAG:  Acute pain of right  shoulder  Stiffness of right shoulder, not elsewhere classified  Muscle weakness (generalized)  Rationale for Evaluation and Treatment Rehabilitation  ONSET DATE: DOS 04/07/22  SUBJECTIVE:                                                                                                                                                                                      SUBJECTIVE STATEMENT: Pt is 23 weeks and 1 day s/p R reverse TSA and biceps tenodesis.  Pt states she had some soreness and pain after prior Rx.  Pt denies pain currently and states she has an aching soreness currently.    Pt reports compliance with HEP.  Pt reports no pain today, just some stiffness.  She states she has some pain and stiffness with ER motion.  She has pain and difficulty closing her car door though is getting a little better.  She is able to reach into cabinet a little better.  Pt has difficulty doffing a shirt.  Pt has difficulty with washing the L side of her body and the lower part of her leg with R UE.      PERTINENT HISTORY: H/o bil RCR R carpal tunnel release in July 2023   PRECAUTIONS: Shoulder, reverse TSA without subscap repair  WEIGHT BEARING RESTRICTIONS Yes    FALLS:  Has patient fallen in last 6 months? No  LIVING ENVIRONMENT: Lives with: lives with their spouse No stairs  OCCUPATION: Not working  PLOF: Independent  PATIENT GOALS the use of my arm and hand back, gardening  OBJECTIVE:    TODAY'S TREATMENT:   Therapeutic Exercise: -Reviewed pain levels, current function, response to prior Rx, and HEP compliance.   -Pt received R shoulder PROM in flexion, abd, ER, and IR per protocol ranges w/n pt and tissue tolerance.  Pt performed:  Pulleys in flex, scaption, and abd x 20 reps each  standing jobe's flexion with 1# x10 reps, 2# 2x10  Standing scaption 2x10 and 1x6 with1#  Shelf reach 2x10   Standing wall walks in flexion 2x5 reps  Standing wall walks in scaption x 5  reps  Supine serratus punch 3x10 3#  S/L ER 1# 2 x 10  Standing ER with arm at side with YTB 2x10  Waist to chest lift with 3# bilat 2x10  Supine rhythmic stab's at 90/60/120 deg 2x30 sec each      PATIENT EDUCATION: Education details:  POC, HEP, and exercise form/rationale.   Person educated: Patient and Spouse Education method: Explanation, Demonstration, Tactile cues, Verbal cues, and Handouts Education comprehension: verbalized understanding, returned demonstration, verbal cues required, tactile cues required, and needs further education   HOME EXERCISE PROGRAM: [id number]    ASSESSMENT:  CLINICAL IMPRESSION: Pt presents to Rx without pain, just reports of tightness.  Pt is improving with shoulder ROM.  She has improved tightness with PROM though has continued tightness and limitations with ER PROM.  Pt is improving with R shoulder strength as evidenced by performance of exercises.  She demonstrates improved form and reduced shoulder hike with standing flex and scaption though requires more cuing with scaption.  Pt is also able to perform shelf reach with improved form and ease.  She continues to fatigue with standing wall AAROM.  Pt responded well to Rx having no pain after Rx.  Pt should continue to benefit from cont skilled PT services to address ongoing goals and to assist with restoring desired level of function.     OBJECTIVE IMPAIRMENTS decreased activity tolerance, decreased knowledge of condition, decreased ROM, decreased strength, increased muscle spasms, impaired flexibility, impaired UE functional use, improper body mechanics, postural dysfunction, and pain.   ACTIVITY LIMITATIONS carrying, lifting, sleeping, bed mobility, bathing, dressing, reach over head, and hygiene/grooming  PARTICIPATION LIMITATIONS: meal prep, cleaning, laundry, driving, shopping, and yard work  PERSONAL FACTORS Past/current experiences and see PMH  are also affecting patient's functional  outcome.   REHAB POTENTIAL: Good  CLINICAL DECISION MAKING: Stable/uncomplicated  EVALUATION COMPLEXITY: Low   GOALS:  SHORT TERM GOALS: Target date: 05/09/2022   AROM flexion to 90 without incr pain and good scap control Baseline:see chart Goal status: GOAL MET  2.  ER in scapular plane to 30 deg Baseline: see chart Goal status: GOAL MET  3.  Pain levels <=3/10 Baseline: see subj reports Goal status:  ONGOING  4.  D/c sling Baseline: as tolerated at 3 wk Goal status: GOAL MET   LONG TERM GOALS: Target date: 08/20/2022     1.  Pt will meet FOTO goal Baseline: see above Goal status: ONGOING Target date: 06/20/22  2.  AROM flexion to 130 without incr pain and good scap control Baseline:  Goal status:  PARTIALLY MET Target date: 09/18/2022  3.  Use of arm for ADLs pain <=3/10 Baseline:  Goal status: GOAL MET Target date: 05/23/2022   4.  Initiate functional IR behind back without incr pain Baseline: can begin at 6 weeks per post op protocol Goal status: GOAL MET Target date: 05/23/2022   5.  Pt will demo R shoulder AROM to be [facility] t/o for performance of ADLs and IADLs.  Goal status: PROGRESSING Target date: 09/18/22  6.  Pt will be able to perform her ADLs and IADLs including reaching activities without significant pain and difficulty.  Goal status: PROGRESSING Target date: 09/18/22    7.  Pt will be able to reach into  an overhead cabinet without significant difficulty.  Goal status: ONGOING Target date: 08/01/22    PLAN: PT FREQUENCY: 2x/wk   PT DURATION: 5 weeks  PLANNED INTERVENTIONS: Therapeutic exercises, Therapeutic activity, Neuromuscular re-education, Balance training, Gait training, Patient/Family education, Self Care, Joint mobilization, Aquatic Therapy, Dry Needling, Electrical stimulation, Spinal mobilization, Cryotherapy, Moist heat, scar mobilization, Taping, Vasopneumatic device, Manual therapy, and Re-evaluation  PLAN FOR NEXT SESSION:  continue per Dr. Eddie Dibbles reverse TSA protocol.   Selinda Michaels III PT, DPT 09/17/22 2:03 PM

## 2022-09-17 ENCOUNTER — Encounter (HOSPITAL_BASED_OUTPATIENT_CLINIC_OR_DEPARTMENT_OTHER): Payer: Medicare Other | Admitting: Physical Therapy

## 2022-09-19 ENCOUNTER — Ambulatory Visit (HOSPITAL_BASED_OUTPATIENT_CLINIC_OR_DEPARTMENT_OTHER): Payer: Medicare Other | Admitting: Physical Therapy

## 2022-09-19 ENCOUNTER — Encounter (HOSPITAL_BASED_OUTPATIENT_CLINIC_OR_DEPARTMENT_OTHER): Payer: Self-pay | Admitting: Physical Therapy

## 2022-09-19 DIAGNOSIS — M25511 Pain in right shoulder: Secondary | ICD-10-CM

## 2022-09-19 DIAGNOSIS — M25611 Stiffness of right shoulder, not elsewhere classified: Secondary | ICD-10-CM

## 2022-09-19 DIAGNOSIS — M6281 Muscle weakness (generalized): Secondary | ICD-10-CM | POA: Diagnosis not present

## 2022-09-19 NOTE — Therapy (Signed)
OUTPATIENT PHYSICAL THERAPY SHOULDER TREATMENT  / PROGRESS NOTE   Progress Note Reporting Period 08/19/2022 to 09/19/2022   See note below for Objective Data and Assessment of Progress/Goals.      Patient Name: [name] MRN: [id number] DOB:[birth year], 77 y.o., female Today's Date: 09/19/2022   PT End of Session - 09/19/22 1027     Visit Number 61    Authorization Type UHC MCR    PT Start Time 1022                           Past Medical History:  Diagnosis Date   Anemia    Anginal pain ([place])    last cp was last year   Anxiety    Arthritis    Blood transfusion    Chest pain 02/04/2016   Coronary artery disease    Coronary artery spasm, wth continued episodes of chest pain.  09/26/2013   Coronary vasospasm (HCC)    Diabetes mellitus without complication (HCC)    type II   GERD (gastroesophageal reflux disease)    Headache    Hiatal hernia    Hypercholesteremia    Hypertension    NSTEMI (non-ST elevated myocardial infarction) ([place]) not sure   NSVT (nonsustained ventricular tachycardia) ([place]) 09/26/2013   Panic attack    Past Surgical History:  Procedure Laterality Date   ABDOMINAL HYSTERECTOMY     PARTIAL HYSTERECTOMY   ACDF N/A    C5-6 ACDF Dr. Sherwood Gambler   breast     right, tumor benign   BREAST EXCISIONAL BIOPSY Right    1961 (age 83) benign   CARDIAC CATHETERIZATION  2014   CARDIAC CATHETERIZATION  2012   CARDIAC CATHETERIZATION N/A 07/22/2016   Procedure: Left Heart Cath and Coronary Angiography;  Surgeon: Belva Crome, MD;  Location: [place] [facility];  Service: Cardiovascular;  Laterality: N/A;   cardiac stent  2012   to RCA   CHOLECYSTECTOMY N/A 10/02/2017   Procedure: LAPAROSCOPIC CHOLECYSTECTOMY;  Surgeon: Ralene Ok, MD;  Location: [place];  Service: General;  Laterality: N/A;   CORONARY ANGIOPLASTY     ESOPHAGEAL MANOMETRY N/A 10/29/2016   Procedure: ESOPHAGEAL MANOMETRY (EM);  Surgeon: Garlan Fair, MD;  [facility];  Service: Endoscopy;  Laterality: N/A;   ESOPHAGOGASTRODUODENOSCOPY (EGD) WITH PROPOFOL N/A 10/28/2016   Procedure: ESOPHAGOGASTRODUODENOSCOPY (EGD) WITH PROPOFOL;  Surgeon: Garlan Fair, MD;  Location: [facility];  Service: Endoscopy;  Laterality: N/A;   EXCISION OF SKIN TAG N/A 07/29/2017   Procedure: EXCISION OF PERIANAL  SKIN TAG;  Surgeon: Ralene Ok, MD;  Location: [place];  Service: General;  Laterality: N/A;   KNEE SURGERY  left   left ear surgery     for bad cut   LEFT HEART CATHETERIZATION WITH CORONARY ANGIOGRAM N/A 02/22/2013   Procedure: LEFT HEART CATHETERIZATION WITH CORONARY ANGIOGRAM;  Surgeon: [name], MD;  Location[facility];  Service: Cardiovascular;  Laterality: N/A;   REVERSE SHOULDER ARTHROPLASTY Right 04/07/2022   Procedure: RIGHT SHOULDER REVERSE SHOULDER ARTHROPLASTY;  Surgeon: Vanetta Mulders, MD;  Location: [place];  Service: Orthopedics;  Laterality: Right;   SHOULDER SURGERY Bilateral    rotator cuff   TOE SURGERY Left    toe surgery   Patient Active Problem List   Diagnosis Date Noted   Rotator cuff arthropathy of right shoulder    Carpal tunnel syndrome, right upper limb 01/03/2022   Chest pain 06/21/2021  Variant angina ([place]) 01/24/2017   Precordial chest pain 01/23/2017   Headache 01/23/2017   S/P angioplasty with stent 01/23/2017   Coronary artery disease 11/24/2014   Syncope 10/22/2014   Hyperglycemia 04/20/2014   H/O hiatal hernia 04/19/2014   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  Rationale for Evaluation and Treatment Rehabilitation  ONSET DATE: DOS 04/07/22  SUBJECTIVE:                                                                                                                                                                                       SUBJECTIVE STATEMENT: Pt is 23 weeks and 4 days s/p R reverse TSA and biceps tenodesis.  Pt denies any adverse effects after prior Rx.  She continues to have pain and stiffness with ER motions.  Pt reports compliance with HEP.    PAIN LEVELS:  1/10 current and worst ; 0/10 best FUNCTIONAL IMPROVEMENTS:  She is able to reach into cabinet a little better and also reaching across body.  Pt reports slight difficulty with ADLs and IADLs.  A little better with closing car door FUNCTIONAL LIMITATIONS:  bathing with R UE, reaching behind back, donning/doffing a shirt overhead.  Pt reports moderate difficulty with reaching into an overhead cabinet.  Lifting objects.  vacuuming   PERTINENT HISTORY: H/o bil RCR R carpal tunnel release in July 2023   PRECAUTIONS: Shoulder, reverse TSA without subscap repair  WEIGHT BEARING RESTRICTIONS Yes    FALLS:  Has patient fallen in last 6 months? No  LIVING ENVIRONMENT: Lives with: lives with their spouse No stairs  OCCUPATION: Not working  PLOF: Independent  PATIENT GOALS the use of my arm and hand back, gardening  OBJECTIVE:    TODAY'S TREATMENT:   Therapeutic Exercise: -Reviewed pain levels, current function, response to prior Rx, and HEP compliance.   -Pt received R shoulder PROM in flexion, abd, ER, and IR per protocol ranges w/n pt and tissue tolerance.   -R shoulder AROM/PROM:   Flex:  139/154 deg Scaption:  118 Abd:  86/120  ER:  41/48 IR:  58/62  -Pt able to reach into an overhead shelf without difficulty with good form.    -R shoulder strength:  Flex:  tolerated minimal resistance Scaption: tolerated slight resistance ER:  tolerated minimal resistance  Pt performed:  Stargazer stretch  3x15 sec  standing jobe's flexion with 2# 2x10  Standing scaption 2x10 with  1#  Waist to chest lift with 3# bilat 2x10      PATIENT SURVEYS:  FOTO:  Prior/Current:  53/60. Goal of 47 at visit #20     PATIENT EDUCATION: Education details:  POC, HEP, objective findings, and exercise form/rationale.   Person educated: Patient and Spouse Education method: Explanation, Demonstration, Tactile cues, Verbal cues, and Handouts Education comprehension: verbalized understanding, returned demonstration, verbal cues required, tactile cues required, and needs further education   HOME EXERCISE PROGRAM: [email or web address]    ASSESSMENT:  CLINICAL IMPRESSION: Pt is improving with performance of ADLs and IADLs.  Though she reports slight difficulty with ADLs and IADLs, she continues to report difficulty with dressing and bathing.  Pt reports improved reaching and demonstrates improved reaching overhead as evidenced by reaching into an overhead cabinet.  Pt is limited with reaching behind her back, but has been working on it in therapy and at home.  Pt demonstrates improved ROM t/o R shoulder as evidenced by goniometric measurements though demonstrated worse ER.  Pt continues to have significant weakness in R shoulder.  Pt is making progress with strength as evidenced by performance and progression of resisted exercises.  Pt demonstrates improved self perceived disability with FOTO score improving from 53 prior to 60 currently.  She is progressing with goals and has met all STG's and LTG's # 1-4 and #7.  New strength goal updated.        OBJECTIVE IMPAIRMENTS decreased activity tolerance, decreased knowledge of condition, decreased ROM, decreased strength, increased muscle spasms, impaired flexibility, impaired UE functional use, improper body mechanics, postural dysfunction, and pain.   ACTIVITY LIMITATIONS carrying, lifting, sleeping, bed mobility, bathing, dressing, reach over head, and hygiene/grooming  PARTICIPATION LIMITATIONS: meal prep, cleaning, laundry, driving, shopping, and  yard work  PERSONAL FACTORS Past/current experiences and see PMH  are also affecting patient's functional outcome.   REHAB POTENTIAL: Good  CLINICAL DECISION MAKING: Stable/uncomplicated  EVALUATION COMPLEXITY: Low   GOALS:  SHORT TERM GOALS: Target date: 05/09/2022   AROM flexion to 90 without incr pain and good scap control Baseline:see chart Goal status: GOAL MET  2.  ER in scapular plane to 30 deg Baseline: see chart Goal status: GOAL MET  3.  Pain levels <=3/10 Baseline: see subj reports Goal status:  GOAL MET  4.  D/c sling Baseline: as tolerated at 3 wk Goal status: GOAL MET   LONG TERM GOALS: Target date: 08/20/2022     1.  Pt will meet FOTO goal Baseline: see above Goal status:  GOAL MET Target date: 06/20/22  2.  AROM flexion to 130 without incr pain and good scap control Baseline:  Goal status:  GOAL MET Target date: 09/18/2022  3.  Use of arm for ADLs pain <=3/10 Baseline:  Goal status: GOAL MET Target date: 05/23/2022   4.  Initiate functional IR behind back without incr pain Baseline: can begin at 6 weeks per post op protocol Goal status: GOAL MET Target date: 05/23/2022   5.  Pt will demo R shoulder AROM to be [facility] t/o for performance of ADLs and IADLs.  Goal status: PROGRESSING Target date:  10/24/2022  6.  Pt will be able to perform her ADLs and IADLs including reaching activities without significant pain and difficulty.  Goal status: PARTIALLY MET Target date:  10/24/2022   7.  Pt will be able to reach into an overhead cabinet without significant  difficulty.  Goal status: GOAL MET Target date: 08/01/22  8.  Pt will demo improved R shoulder strength to at least 4/5 and ER 4-/5 w/n available range for improved tolerance with functional lifting/carrying.   Goal status:  INITIAL  Target date:  10/24/2022     PLAN: PT FREQUENCY: 1-2x/wk  PT DURATION: 5 weeks  PLANNED INTERVENTIONS: Therapeutic exercises, Therapeutic activity,  Neuromuscular re-education, Balance training, Gait training, Patient/Family education, Self Care, Joint mobilization, Aquatic Therapy, Dry Needling, Electrical stimulation, Spinal mobilization, Cryotherapy, Moist heat, scar mobilization, Taping, Vasopneumatic device, Manual therapy, and Re-evaluation  PLAN FOR NEXT SESSION: continue per Dr. Eddie Dibbles reverse TSA protocol.  Continue to work on strengthening and stability.  Work on ER ROM.    Selinda Michaels III PT, DPT 09/19/22 2:10 PM

## 2022-09-24 ENCOUNTER — Encounter (HOSPITAL_BASED_OUTPATIENT_CLINIC_OR_DEPARTMENT_OTHER): Payer: Self-pay | Admitting: Physical Therapy

## 2022-09-24 ENCOUNTER — Ambulatory Visit (HOSPITAL_BASED_OUTPATIENT_CLINIC_OR_DEPARTMENT_OTHER): Payer: Medicare Other | Admitting: Physical Therapy

## 2022-09-24 DIAGNOSIS — M6281 Muscle weakness (generalized): Secondary | ICD-10-CM | POA: Diagnosis not present

## 2022-09-24 DIAGNOSIS — M25511 Pain in right shoulder: Secondary | ICD-10-CM

## 2022-09-24 DIAGNOSIS — M25611 Stiffness of right shoulder, not elsewhere classified: Secondary | ICD-10-CM | POA: Diagnosis not present

## 2022-09-24 NOTE — Therapy (Signed)
OUTPATIENT PHYSICAL THERAPY SHOULDER TREATMENT     Patient Name: [name] MRN: [id number] DOB:February 18, 1946, 77 y.o., female Today's Date: 09/24/2022   PT End of Session - 09/24/22 0946     Visit Number 40    Number of Visits 45    Date for PT Re-Evaluation 10/24/22    Authorization Type UHC MCR    PT Start Time 0943    PT Stop Time 1023    PT Time Calculation (min) 40 min    Activity Tolerance Patient tolerated treatment well    Behavior During Therapy [facility] for tasks assessed/performed                           Past Medical History:  Diagnosis Date   Anemia    Anginal pain ([place])    last cp was last year   Anxiety    Arthritis    Blood transfusion    Chest pain 02/04/2016   Coronary artery disease    Coronary artery spasm, wth continued episodes of chest pain.  09/26/2013   Coronary vasospasm (HCC)    Diabetes mellitus without complication (HCC)    type II   GERD (gastroesophageal reflux disease)    Headache    Hiatal hernia    Hypercholesteremia    Hypertension    NSTEMI (non-ST elevated myocardial infarction) ([place]) not sure   NSVT (nonsustained ventricular tachycardia) ([place]) 09/26/2013   Panic attack    Past Surgical History:  Procedure Laterality Date   ABDOMINAL HYSTERECTOMY     PARTIAL HYSTERECTOMY   ACDF N/A    C5-6 ACDF Dr. Sherwood Gambler   breast     right, tumor benign   BREAST EXCISIONAL BIOPSY Right    1961 (age 47) benign   CARDIAC CATHETERIZATION  2014   CARDIAC CATHETERIZATION  2012   CARDIAC CATHETERIZATION N/A 07/22/2016   Procedure: Left Heart Cath and Coronary Angiography;  Surgeon: Belva Crome, MD;  Location: [place] [facility];  Service: Cardiovascular;  Laterality: N/A;   cardiac stent  2012   to RCA   CHOLECYSTECTOMY N/A 10/02/2017   Procedure: LAPAROSCOPIC CHOLECYSTECTOMY;  Surgeon: Ralene Ok, MD;  Location: [place];  Service: General;  Laterality: N/A;   CORONARY ANGIOPLASTY     ESOPHAGEAL MANOMETRY N/A  10/29/2016   Procedure: ESOPHAGEAL MANOMETRY (EM);  Surgeon: Garlan Fair, MD;  [facility];  Service: Endoscopy;  Laterality: N/A;   ESOPHAGOGASTRODUODENOSCOPY (EGD) WITH PROPOFOL N/A 10/28/2016   Procedure: ESOPHAGOGASTRODUODENOSCOPY (EGD) WITH PROPOFOL;  Surgeon: Garlan Fair, MD;  [facility];  Service: Endoscopy;  Laterality: N/A;   EXCISION OF SKIN TAG N/A 07/29/2017   Procedure: EXCISION OF PERIANAL  SKIN TAG;  Surgeon: Ralene Ok, MD;  Location: [place];  Service: General;  Laterality: N/A;   KNEE SURGERY  left   left ear surgery     for bad cut   LEFT HEART CATHETERIZATION WITH CORONARY ANGIOGRAM N/A 02/22/2013   Procedure: LEFT HEART CATHETERIZATION WITH CORONARY ANGIOGRAM;  Surgeon: [name], MD;  Location: [facility] CATH LAB;  Service: Cardiovascular;  Laterality: N/A;   REVERSE SHOULDER ARTHROPLASTY Right 04/07/2022   Procedure: RIGHT SHOULDER REVERSE SHOULDER ARTHROPLASTY;  Surgeon: Vanetta Mulders, MD;  Location: [place];  Service: Orthopedics;  Laterality: Right;   SHOULDER SURGERY Bilateral    rotator cuff   TOE SURGERY Left    toe surgery   Patient Active Problem List   Diagnosis Date Noted  Rotator cuff arthropathy of right shoulder    Carpal tunnel syndrome, right upper limb 01/03/2022   Chest pain 06/21/2021   Variant angina ([place]) 01/24/2017   Precordial chest pain 01/23/2017   Headache 01/23/2017   S/P angioplasty with stent 01/23/2017   Coronary artery disease 11/24/2014   Syncope 10/22/2014   Hyperglycemia 04/20/2014   H/O hiatal hernia 04/19/2014   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    Coronary artery vasospasm ([place]) 09/26/2013   HTN (hypertension) 12/08/2011   GERD (gastroesophageal reflux disease) 12/08/2011   Depression 12/08/2011   Dyslipidemia 12/08/2011     REFERRING PROVIDER: Vanetta Mulders MD  REFERRING DIAG: s/p Rt reverse TSA + biceps tenodesis, without subscap repair    THERAPY DIAG:  Acute pain of right  shoulder  Stiffness of right shoulder, not elsewhere classified  Muscle weakness (generalized)  Rationale for Evaluation and Treatment Rehabilitation  ONSET DATE: DOS 04/07/22  SUBJECTIVE:                                                                                                                                                                                      SUBJECTIVE STATEMENT: Pt is 24 weeks and 2 days s/p R reverse TSA and biceps tenodesis.  Pt states she is not in much pain, she just has stiffness. Pt denies any adverse effects after prior Rx.  She continues to have pain and stiffness with ER motions.  Pt reports compliance with HEP.  Pt reports 1/10 pain currently.    PERTINENT HISTORY: H/o bil RCR R carpal tunnel release in July 2023   PRECAUTIONS: Shoulder, reverse TSA without subscap repair  WEIGHT BEARING RESTRICTIONS Yes    FALLS:  Has patient fallen in last 6 months? No  LIVING ENVIRONMENT: Lives with: lives with their spouse No stairs  OCCUPATION: Not working  PLOF: Independent  PATIENT GOALS the use of my arm and hand back, gardening  OBJECTIVE:    TODAY'S TREATMENT:   Therapeutic Exercise:  -Pt received R shoulder PROM in flexion, abd, ER, and IR per protocol ranges w/n pt and tissue tolerance.   Pt performed:            Pulleys in flex, scaption, and abd x 20 reps each            standing jobe's flexion with 2# 3x10            Standing scaption 3x10 with 1#  Waist to chest lift with 3# bilat 2x10            Shelf reach x10 with 0# ; x10 with 1#  Standing wall walks in flexion 3x5 reps            Supine serratus punch 3x10 3#            S/L ER 1# 2 x 10            4D ball rolls on wall at 90 deg flex x 10 reps each            Supine rhythmic stab's at 90/60/120 deg 2x30 sec each  Stargazer stretch 3x15 sec         PATIENT EDUCATION: Education details:  POC, HEP, objective findings, and exercise form/rationale.    Person educated: Patient and Spouse Education method: Explanation, Demonstration, Tactile cues, Verbal cues, and Handouts Education comprehension: verbalized understanding, returned demonstration, verbal cues required, tactile cues required, and needs further education   HOME EXERCISE PROGRAM: [email or web address]    ASSESSMENT:  CLINICAL IMPRESSION: Pt is improving with R UE strength as evidenced by performance of exercises.  She had improved form with 2# Jobe's flexion performing 3 sets of 10 reps and was able to perform shelf reach with 1# today.  Pt was fatigued with exercises today.  She continues to have limited ER ROM.  Pt responded well to Rx stating she had improved stiffness after Rx and no increased pain.  Pt should benefit from cont skilled PT services per protocol to address ongoing goals and impairments and to assist in restoring desired level of function.      OBJECTIVE IMPAIRMENTS decreased activity tolerance, decreased knowledge of condition, decreased ROM, decreased strength, increased muscle spasms, impaired flexibility, impaired UE functional use, improper body mechanics, postural dysfunction, and pain.   ACTIVITY LIMITATIONS carrying, lifting, sleeping, bed mobility, bathing, dressing, reach over head, and hygiene/grooming  PARTICIPATION LIMITATIONS: meal prep, cleaning, laundry, driving, shopping, and yard work  PERSONAL FACTORS Past/current experiences and see PMH  are also affecting patient's functional outcome.   REHAB POTENTIAL: Good  CLINICAL DECISION MAKING: Stable/uncomplicated  EVALUATION COMPLEXITY: Low   GOALS:  SHORT TERM GOALS: Target date: 05/09/2022   AROM flexion to 90 without incr pain and good scap control Baseline:see chart Goal status: GOAL MET  2.  ER in scapular plane to 30 deg Baseline: see chart Goal status: GOAL MET  3.  Pain levels <=3/10 Baseline: see subj reports Goal status:  GOAL MET  4.  D/c sling Baseline: as tolerated at 3  wk Goal status: GOAL MET   LONG TERM GOALS: Target date: 08/20/2022     1.  Pt will meet FOTO goal Baseline: see above Goal status:  GOAL MET Target date: 06/20/22  2.  AROM flexion to 130 without incr pain and good scap control Baseline:  Goal status:  GOAL MET Target date: 09/18/2022  3.  Use of arm for ADLs pain <=3/10 Baseline:  Goal status: GOAL MET Target date: 05/23/2022   4.  Initiate functional IR behind back without incr pain Baseline: can begin at 6 weeks per post op protocol Goal status: GOAL MET Target date: 05/23/2022   5.  Pt will demo R shoulder AROM to be [facility] t/o for performance of ADLs and IADLs.  Goal status: PROGRESSING Target date:  10/24/2022  6.  Pt will be able to perform her ADLs and IADLs including reaching activities without significant pain and difficulty.  Goal status: PARTIALLY MET Target date:  10/24/2022   7.  Pt will be able to reach into an overhead cabinet without significant difficulty.  Goal status:  GOAL MET Target date: 08/01/22  8.  Pt will demo improved R shoulder strength to at least 4/5 and ER 4-/5 w/n available range for improved tolerance with functional lifting/carrying.   Goal status:  INITIAL  Target date:  10/24/2022     PLAN: PT FREQUENCY: 1-2x/wk  PT DURATION: 5 weeks  PLANNED INTERVENTIONS: Therapeutic exercises, Therapeutic activity, Neuromuscular re-education, Balance training, Gait training, Patient/Family education, Self Care, Joint mobilization, Aquatic Therapy, Dry Needling, Electrical stimulation, Spinal mobilization, Cryotherapy, Moist heat, scar mobilization, Taping, Vasopneumatic device, Manual therapy, and Re-evaluation  PLAN FOR NEXT SESSION: continue per Dr. Eddie Dibbles reverse TSA protocol.  Continue to work on strengthening and stability.  Work on ER ROM.    Selinda Michaels III PT, DPT 09/24/22 5:01 PM

## 2022-09-26 ENCOUNTER — Encounter (HOSPITAL_BASED_OUTPATIENT_CLINIC_OR_DEPARTMENT_OTHER): Payer: Self-pay | Admitting: Physical Therapy

## 2022-09-26 ENCOUNTER — Ambulatory Visit (HOSPITAL_BASED_OUTPATIENT_CLINIC_OR_DEPARTMENT_OTHER): Payer: Medicare Other | Admitting: Physical Therapy

## 2022-09-26 DIAGNOSIS — M6281 Muscle weakness (generalized): Secondary | ICD-10-CM

## 2022-09-26 DIAGNOSIS — M25611 Stiffness of right shoulder, not elsewhere classified: Secondary | ICD-10-CM | POA: Diagnosis not present

## 2022-09-26 DIAGNOSIS — M25511 Pain in right shoulder: Secondary | ICD-10-CM | POA: Diagnosis not present

## 2022-09-26 NOTE — Therapy (Signed)
OUTPATIENT PHYSICAL THERAPY SHOULDER TREATMENT     Patient Name: [name] MRN: [id number] DOB:1945/12/28, 77 y.o., female Today's Date: 09/26/2022   PT End of Session - 09/26/22 1026     Visit Number 41    Number of Visits 45    Date for PT Re-Evaluation 10/24/22    Authorization Type [employer] MCR    PT Start Time 1013    PT Stop Time 1100    PT Time Calculation (min) 47 min    Activity Tolerance Patient tolerated treatment well    Behavior During Therapy WFL for tasks assessed/performed                            Past Medical History:  Diagnosis Date   Anemia    Anginal pain ([place])    last cp was last year   Anxiety    Arthritis    Blood transfusion    Chest pain 02/04/2016   Coronary artery disease    Coronary artery spasm, wth continued episodes of chest pain.  09/26/2013   Coronary vasospasm (HCC)    Diabetes mellitus without complication (HCC)    type II   GERD (gastroesophageal reflux disease)    Headache    Hiatal hernia    Hypercholesteremia    Hypertension    NSTEMI (non-ST elevated myocardial infarction) ([place]) not sure   NSVT (nonsustained ventricular tachycardia) ([place]) 09/26/2013   Panic attack    Past Surgical History:  Procedure Laterality Date   ABDOMINAL HYSTERECTOMY     PARTIAL HYSTERECTOMY   ACDF N/A    C5-6 ACDF Dr. Sherwood Gambler   breast     right, tumor benign   BREAST EXCISIONAL BIOPSY Right    1961 (age 52) benign   CARDIAC CATHETERIZATION  2014   CARDIAC CATHETERIZATION  2012   CARDIAC CATHETERIZATION N/A 07/22/2016   Procedure: Left Heart Cath and Coronary Angiography;  Surgeon: Belva Crome, MD;  Location: [facility];  Service: Cardiovascular;  Laterality: N/A;   cardiac stent  2012   to RCA   CHOLECYSTECTOMY N/A 10/02/2017   Procedure: LAPAROSCOPIC CHOLECYSTECTOMY;  Surgeon: Ralene Ok, MD;  Location: [facility];  Service: General;  Laterality: N/A;   CORONARY ANGIOPLASTY     ESOPHAGEAL MANOMETRY N/A  10/29/2016   Procedure: ESOPHAGEAL MANOMETRY (EM);  Surgeon: Garlan Fair, MD;  Location: [facility];  Service: Endoscopy;  Laterality: N/A;   ESOPHAGOGASTRODUODENOSCOPY (EGD) WITH PROPOFOL N/A 10/28/2016   Procedure: ESOPHAGOGASTRODUODENOSCOPY (EGD) WITH PROPOFOL;  Surgeon: Garlan Fair, MD;  [facility]: [facility];  Service: Endoscopy;  Laterality: N/A;   EXCISION OF SKIN TAG N/A 07/29/2017   Procedure: EXCISION OF PERIANAL  SKIN TAG;  Surgeon: Ralene Ok, MD;  Location: [facility];  Service: General;  Laterality: N/A;   KNEE SURGERY  left   left ear surgery     for bad cut   LEFT HEART CATHETERIZATION WITH CORONARY ANGIOGRAM N/A 02/22/2013   Procedure: LEFT HEART CATHETERIZATION WITH CORONARY ANGIOGRAM;  Surgeon: [name], MD;  Location: [facility];  Service: Cardiovascular;  Laterality: N/A;   REVERSE SHOULDER ARTHROPLASTY Right 04/07/2022   Procedure: RIGHT SHOULDER REVERSE SHOULDER ARTHROPLASTY;  Surgeon: Vanetta Mulders, MD;  Location: [facility];  Service: Orthopedics;  Laterality: Right;   SHOULDER SURGERY Bilateral    rotator cuff   TOE SURGERY Left    toe surgery   Patient Active Problem List   Diagnosis Date  Noted   Rotator cuff arthropathy of right shoulder    Carpal tunnel syndrome, right upper limb 01/03/2022   Chest pain 06/21/2021   Variant angina ([place]) 01/24/2017   Precordial chest pain 01/23/2017   Headache 01/23/2017   S/P angioplasty with stent 01/23/2017   Coronary artery disease 11/24/2014   Syncope 10/22/2014   Hyperglycemia 04/20/2014   H/O hiatal hernia 04/19/2014   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    Coronary artery vasospasm ([place]) 09/26/2013   HTN (hypertension) 12/08/2011   GERD (gastroesophageal reflux disease) 12/08/2011   Depression 12/08/2011   Dyslipidemia 12/08/2011     REFERRING PROVIDER: Vanetta Mulders MD  REFERRING DIAG: s/p Rt reverse TSA + biceps tenodesis, without subscap repair    THERAPY DIAG:  Acute pain of right  shoulder  Stiffness of right shoulder, not elsewhere classified  Muscle weakness (generalized)  Rationale for Evaluation and Treatment Rehabilitation  ONSET DATE: DOS 04/07/22  SUBJECTIVE:                                                                                                                                                                                      SUBJECTIVE STATEMENT: Pt is 24 weeks and 4 days s/p R reverse TSA and biceps tenodesis.  Pt states she felt fine after prior Rx and did have soreness the following day.  Pt reports having difficulty lifting R UE yesterday.  Pt denies any adverse effects after prior Rx.  She continues to have pain and stiffness with ER motions.  Pt reports continued difficulty with lifting objects including her coffee cup.  Pt unable to vacuum and has difficulty sweeping.  Pt reports improved reaching behind her head, behind her back, and reaching to apply deodorant.  Pt reports compliance with HEP.  Pt reports 1/10 pain and has soreness in R shoulder currently.    PERTINENT HISTORY: H/o bil RCR R carpal tunnel release in July 2023   PRECAUTIONS: Shoulder, reverse TSA without subscap repair  WEIGHT BEARING RESTRICTIONS Yes    FALLS:  Has patient fallen in last 6 months? No  LIVING ENVIRONMENT: Lives with: lives with their spouse No stairs  OCCUPATION: Not working  PLOF: Independent  PATIENT GOALS the use of my arm and hand back, gardening  OBJECTIVE:    TODAY'S TREATMENT:   Therapeutic Exercise:  -Pt received R shoulder PROM in flexion, abd, ER, and IR per protocol ranges w/n pt and tissue tolerance.  -Pt performed:            Pulleys in flex, scaption, and abd x 20 reps each            standing jobe's flexion with 2# 3x10  Standing scaption 3x10 with 1#                  S/L ER 1# 3 x 10            4D ball rolls on wall at 90 deg flex x 10 reps each            Supine rhythmic stab's at 90/60/120 deg 2x30 sec  each  Stargazer stretch 3x15 sec  Supine wand ER 2x10   Therapeutic Activities:  (to improve functional reaching and lifting) Waist to chest lift with 3# bilat 3x10 Shelf reach 2 x 10 with 1#  Functional IR reaching         PATIENT EDUCATION: Education details:  POC including the plan to decrease frequency.  Progress, HEP, and exercise form/rationale.   Person educated: Patient and Spouse Education method: Explanation, Demonstration, Tactile cues, Verbal cues, and Handouts Education comprehension: verbalized understanding, returned demonstration, verbal cues required, tactile cues required, and needs further education   HOME EXERCISE PROGRAM: [email or web address]    ASSESSMENT:  CLINICAL IMPRESSION: Pt reports continued difficulty with lifting objects including her coffee cup.  Pt reports improved functional reaching including reaching behind her head, behind her back, and reaching to apply deodorant.  Pt is improving with R UE strength as evidenced by performance of exercises.  She continues to have limited ER ROM and also has pain with ER PROM.  PT had pt perform stargazer stretch and supine wand ER to improver ER ROM.  Pt responded well to Rx stating reporting improved pain to 0/10 after Rx.  Pt should benefit from cont skilled PT services per protocol to address ongoing goals and impairments and to assist in restoring desired level of function.     OBJECTIVE IMPAIRMENTS decreased activity tolerance, decreased knowledge of condition, decreased ROM, decreased strength, increased muscle spasms, impaired flexibility, impaired UE functional use, improper body mechanics, postural dysfunction, and pain.   ACTIVITY LIMITATIONS carrying, lifting, sleeping, bed mobility, bathing, dressing, reach over head, and hygiene/grooming  PARTICIPATION LIMITATIONS: meal prep, cleaning, laundry, driving, shopping, and yard work  PERSONAL FACTORS Past/current experiences and see PMH  are also affecting  patient's functional outcome.   REHAB POTENTIAL: Good  CLINICAL DECISION MAKING: Stable/uncomplicated  EVALUATION COMPLEXITY: Low   GOALS:  SHORT TERM GOALS: Target date: 05/09/2022   AROM flexion to 90 without incr pain and good scap control Baseline:see chart Goal status: GOAL MET  2.  ER in scapular plane to 30 deg Baseline: see chart Goal status: GOAL MET  3.  Pain levels <=3/10 Baseline: see subj reports Goal status:  GOAL MET  4.  D/c sling Baseline: as tolerated at 3 wk Goal status: GOAL MET   LONG TERM GOALS: Target date: 08/20/2022     1.  Pt will meet FOTO goal Baseline: see above Goal status:  GOAL MET Target date: 06/20/22  2.  AROM flexion to 130 without incr pain and good scap control Baseline:  Goal status:  GOAL MET Target date: 09/18/2022  3.  Use of arm for ADLs pain <=3/10 Baseline:  Goal status: GOAL MET Target date: 05/23/2022   4.  Initiate functional IR behind back without incr pain Baseline: can begin at 6 weeks per post op protocol Goal status: GOAL MET Target date: 05/23/2022   5.  Pt will demo R shoulder AROM to be [employer] t/o for performance of ADLs and IADLs.  Goal status: PROGRESSING Target date:  10/24/2022  6.  Pt  will be able to perform her ADLs and IADLs including reaching activities without significant pain and difficulty.  Goal status: PARTIALLY MET Target date:  10/24/2022   7.  Pt will be able to reach into an overhead cabinet without significant difficulty.  Goal status: GOAL MET Target date: 08/01/22  8.  Pt will demo improved R shoulder strength to at least 4/5 and ER 4-/5 w/n available range for improved tolerance with functional lifting/carrying.   Goal status:  INITIAL  Target date:  10/24/2022     PLAN: PT FREQUENCY: 1-2x/wk  PT DURATION: 5 weeks  PLANNED INTERVENTIONS: Therapeutic exercises, Therapeutic activity, Neuromuscular re-education, Balance training, Gait training, Patient/Family education, Self  Care, Joint mobilization, Aquatic Therapy, Dry Needling, Electrical stimulation, Spinal mobilization, Cryotherapy, Moist heat, scar mobilization, Taping, Vasopneumatic device, Manual therapy, and Re-evaluation  PLAN FOR NEXT SESSION: continue per Dr. Eddie Dibbles reverse TSA protocol.  Continue to work on strengthening and stability.  Work on ER ROM.  Will see pt 2x next week and decrease frequency to 1x/wk after next week.   Selinda Michaels III PT, DPT 09/26/22 3:25 PM

## 2022-10-01 ENCOUNTER — Ambulatory Visit (HOSPITAL_BASED_OUTPATIENT_CLINIC_OR_DEPARTMENT_OTHER): Payer: Medicare Other | Admitting: Physical Therapy

## 2022-10-01 ENCOUNTER — Encounter (HOSPITAL_BASED_OUTPATIENT_CLINIC_OR_DEPARTMENT_OTHER): Payer: Self-pay | Admitting: Physical Therapy

## 2022-10-01 DIAGNOSIS — M6281 Muscle weakness (generalized): Secondary | ICD-10-CM | POA: Diagnosis not present

## 2022-10-01 DIAGNOSIS — M25511 Pain in right shoulder: Secondary | ICD-10-CM

## 2022-10-01 DIAGNOSIS — M25611 Stiffness of right shoulder, not elsewhere classified: Secondary | ICD-10-CM | POA: Diagnosis not present

## 2022-10-01 NOTE — Therapy (Signed)
OUTPATIENT PHYSICAL THERAPY SHOULDER TREATMENT     Patient Name: [name] MRN: [id number] DOB:03-Nov-1945, 77 y.o., female Today's Date: 10/02/2022   PT End of Session - 10/01/22 0943     Visit Number 42    Number of Visits 45    Date for PT Re-Evaluation 10/24/22    Authorization Type U[employer] MCR    PT Start Time 0939    PT Stop Time 1022    PT Time Calculation (min) 43 min    Activity Tolerance Patient tolerated treatment well    Behavior During Therapy [facility] for tasks assessed/performed                            Past Medical History:  Diagnosis Date   Anemia    Anginal pain ([place])    last cp was last year   Anxiety    Arthritis    Blood transfusion    Chest pain 02/04/2016   Coronary artery disease    Coronary artery spasm, wth continued episodes of chest pain.  09/26/2013   Coronary vasospasm (HCC)    Diabetes mellitus without complication (HCC)    type II   GERD (gastroesophageal reflux disease)    Headache    Hiatal hernia    Hypercholesteremia    Hypertension    NSTEMI (non-ST elevated myocardial infarction) ([place]) not sure   NSVT (nonsustained ventricular tachycardia) ([place]) 09/26/2013   Panic attack    Past Surgical History:  Procedure Laterality Date   ABDOMINAL HYSTERECTOMY     PARTIAL HYSTERECTOMY   ACDF N/A    C5-6 ACDF Dr. Sherwood Gambler   breast     right, tumor benign   BREAST EXCISIONAL BIOPSY Right    1961 (age 73) benign   CARDIAC CATHETERIZATION  2014   CARDIAC CATHETERIZATION  2012   CARDIAC CATHETERIZATION N/A 07/22/2016   Procedure: Left Heart Cath and Coronary Angiography;  Surgeon: Belva Crome, MD;  Location: [facility];  Service: Cardiovascular;  Laterality: N/A;   cardiac stent  2012   to RCA   CHOLECYSTECTOMY N/A 10/02/2017   Procedure: LAPAROSCOPIC CHOLECYSTECTOMY;  Surgeon: Ralene Ok, MD;  Location: [place];  Service: General;  Laterality: N/A;   CORONARY ANGIOPLASTY     ESOPHAGEAL MANOMETRY N/A  10/29/2016   Procedure: ESOPHAGEAL MANOMETRY (EM);  Surgeon: Garlan Fair, MD;  [facility];  Service: Endoscopy;  Laterality: N/A;   ESOPHAGOGASTRODUODENOSCOPY (EGD) WITH PROPOFOL N/A 10/28/2016   Procedure: ESOPHAGOGASTRODUODENOSCOPY (EGD) WITH PROPOFOL;  Surgeon: Garlan Fair, MD;  [facility];  Service: Endoscopy;  Laterality: N/A;   EXCISION OF SKIN TAG N/A 07/29/2017   Procedure: EXCISION OF PERIANAL  SKIN TAG;  Surgeon: Ralene Ok, MD;  Location: [place];  Service: General;  Laterality: N/A;   KNEE SURGERY  left   left ear surgery     for bad cut   LEFT HEART CATHETERIZATION WITH CORONARY ANGIOGRAM N/A 02/22/2013   Procedure: LEFT HEART CATHETERIZATION WITH CORONARY ANGIOGRAM;  Surgeon: [name], MD;  Location: [facility];  Service: Cardiovascular;  Laterality: N/A;   REVERSE SHOULDER ARTHROPLASTY Right 04/07/2022   Procedure: RIGHT SHOULDER REVERSE SHOULDER ARTHROPLASTY;  Surgeon: Vanetta Mulders, MD;  Location: [place];  Service: Orthopedics;  Laterality: Right;   SHOULDER SURGERY Bilateral    rotator cuff   TOE SURGERY Left    toe surgery   Patient Active Problem List   Diagnosis Date  Noted   Rotator cuff arthropathy of right shoulder    Carpal tunnel syndrome, right upper limb 01/03/2022   Chest pain 06/21/2021   Variant angina ([place]) 01/24/2017   Precordial chest pain 01/23/2017   Headache 01/23/2017   S/P angioplasty with stent 01/23/2017   Coronary artery disease 11/24/2014   Syncope 10/22/2014   Hyperglycemia 04/20/2014   H/O hiatal hernia 04/19/2014   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    Coronary artery vasospasm ([place]) 09/26/2013   HTN (hypertension) 12/08/2011   GERD (gastroesophageal reflux disease) 12/08/2011   Depression 12/08/2011   Dyslipidemia 12/08/2011     REFERRING PROVIDER: Vanetta Mulders MD  REFERRING DIAG: s/p Rt reverse TSA + biceps tenodesis, without subscap repair    THERAPY DIAG:  Acute pain of right  shoulder  Stiffness of right shoulder, not elsewhere classified  Muscle weakness (generalized)  Rationale for Evaluation and Treatment Rehabilitation  ONSET DATE: DOS 04/07/22  SUBJECTIVE:                                                                                                                                                                                      SUBJECTIVE STATEMENT: Pt is 25 weeks and 2 days s/p R reverse TSA and biceps tenodesis.  Pt denies any adverse effects after prior Rx, just a little soreness.  Pt denies pain currently, just a little soreness.  Pt reports having pain in R wrist and shoulder/prox UE when using R UE on rail with stairs. She continues to have pain and stiffness with ER motions.  Pt reports continued difficulty with lifting objects including her coffee cup.  Pt reports improved reaching behind her head, behind her back, and reaching to apply deodorant.  Pt reports compliance with HEP.     PERTINENT HISTORY: H/o bil RCR R carpal tunnel release in July 2023   PRECAUTIONS: Shoulder, reverse TSA without subscap repair  WEIGHT BEARING RESTRICTIONS Yes    FALLS:  Has patient fallen in last 6 months? No  LIVING ENVIRONMENT: Lives with: lives with their spouse No stairs  OCCUPATION: Not working  PLOF: Independent  PATIENT GOALS the use of my arm and hand back, gardening  OBJECTIVE:    TODAY'S TREATMENT:   Therapeutic Exercise:  -Pt received R shoulder PROM in flexion, abd, ER, and IR per protocol ranges w/n pt and tissue tolerance.  -Pt performed:            Pulleys in flex, scaption, and abd x 20 reps each            standing jobe's flexion with 2# 3x10            Standing scaption 3x10  with 1#                  S/L ER 1# 3 x 10            4D ball rolls on wall at 90 deg flex 2 x 10 reps each            Supine rhythmic stab's at 90/60/120 deg 2x30 sec each  Stargazer stretch 3x15 sec     Therapeutic Activities:  (to improve  functional reaching and lifting) Waist to chest lift with 3# bilat 3x10 Shelf reach 2 x 10 with 1#  Functional IR reaching         PATIENT EDUCATION: Education details:  POC including the plan to decrease frequency.  Progress, HEP, and exercise form/rationale.   Person educated: Patient and Spouse Education method: Explanation, Demonstration, Tactile cues, Verbal cues, and Handouts Education comprehension: verbalized understanding, returned demonstration, verbal cues required, tactile cues required, and needs further education   HOME EXERCISE PROGRAM: [email or web address]    ASSESSMENT:  CLINICAL IMPRESSION: Pt continues to have pain and limitations with ER ROM, but has been working on that motion at home.  PT is working on improving strength in R UE and pt is improving with performance of resistance exercises.  She does have weakness and fatigue in R UE though is improving with exercises.  PT appears to have improved functional IR AROM today based on visual observation.  She tolerated PROM well.  Pt responded well to Rx having no pain after Rx.  Pt should benefit from cont skilled PT services per protocol to address ongoing goals and impairments and to assist in restoring desired level of function.     OBJECTIVE IMPAIRMENTS decreased activity tolerance, decreased knowledge of condition, decreased ROM, decreased strength, increased muscle spasms, impaired flexibility, impaired UE functional use, improper body mechanics, postural dysfunction, and pain.   ACTIVITY LIMITATIONS carrying, lifting, sleeping, bed mobility, bathing, dressing, reach over head, and hygiene/grooming  PARTICIPATION LIMITATIONS: meal prep, cleaning, laundry, driving, shopping, and yard work  PERSONAL FACTORS Past/current experiences and see PMH  are also affecting patient's functional outcome.   REHAB POTENTIAL: Good  CLINICAL DECISION MAKING: Stable/uncomplicated  EVALUATION COMPLEXITY: Low   GOALS:  SHORT TERM  GOALS: Target date: 05/09/2022   AROM flexion to 90 without incr pain and good scap control Baseline:see chart Goal status: GOAL MET  2.  ER in scapular plane to 30 deg Baseline: see chart Goal status: GOAL MET  3.  Pain levels <=3/10 Baseline: see subj reports Goal status:  GOAL MET  4.  D/c sling Baseline: as tolerated at 3 wk Goal status: GOAL MET   LONG TERM GOALS: Target date: 08/20/2022     1.  Pt will meet FOTO goal Baseline: see above Goal status:  GOAL MET Target date: 06/20/22  2.  AROM flexion to 130 without incr pain and good scap control Baseline:  Goal status:  GOAL MET Target date: 09/18/2022  3.  Use of arm for ADLs pain <=3/10 Baseline:  Goal status: GOAL MET Target date: 05/23/2022   4.  Initiate functional IR behind back without incr pain Baseline: can begin at 6 weeks per post op protocol Goal status: GOAL MET Target date: 05/23/2022   5.  Pt will demo R shoulder AROM to be [facility] t/o for performance of ADLs and IADLs.  Goal status: PROGRESSING Target date:  10/24/2022  6.  Pt will be able to perform her ADLs and IADLs including  reaching activities without significant pain and difficulty.  Goal status: PARTIALLY MET Target date:  10/24/2022   7.  Pt will be able to reach into an overhead cabinet without significant difficulty.  Goal status: GOAL MET Target date: 08/01/22  8.  Pt will demo improved R shoulder strength to at least 4/5 and ER 4-/5 w/n available range for improved tolerance with functional lifting/carrying.   Goal status:  INITIAL  Target date:  10/24/2022     PLAN: PT FREQUENCY: 1-2x/wk  PT DURATION: 5 weeks  PLANNED INTERVENTIONS: Therapeutic exercises, Therapeutic activity, Neuromuscular re-education, Balance training, Gait training, Patient/Family education, Self Care, Joint mobilization, Aquatic Therapy, Dry Needling, Electrical stimulation, Spinal mobilization, Cryotherapy, Moist heat, scar mobilization, Taping,  Vasopneumatic device, Manual therapy, and Re-evaluation  PLAN FOR NEXT SESSION: continue per Dr. Eddie Dibbles reverse TSA protocol.  Continue to work on strengthening and stability.  Work on ER ROM.  Will see pt 2x this week and decrease frequency to 1x/wk after next week.   Selinda Michaels III PT, DPT 10/02/22 10:25 AM

## 2022-10-03 ENCOUNTER — Ambulatory Visit (HOSPITAL_BASED_OUTPATIENT_CLINIC_OR_DEPARTMENT_OTHER): Payer: Medicare Other | Admitting: Physical Therapy

## 2022-10-05 ENCOUNTER — Encounter (HOSPITAL_COMMUNITY): Payer: Self-pay

## 2022-10-05 ENCOUNTER — Emergency Department (HOSPITAL_COMMUNITY): Payer: Medicare Other

## 2022-10-05 ENCOUNTER — Other Ambulatory Visit: Payer: Self-pay

## 2022-10-05 ENCOUNTER — Observation Stay (HOSPITAL_COMMUNITY): Payer: Medicare Other

## 2022-10-05 ENCOUNTER — Inpatient Hospital Stay (HOSPITAL_COMMUNITY)
Admission: EM | Admit: 2022-10-05 | Discharge: 2022-10-07 | DRG: 287 | Disposition: A | Discharge status: Home / Self Care | Payer: Medicare Other | Attending: Internal Medicine | Admitting: Internal Medicine

## 2022-10-05 DIAGNOSIS — I5042 Chronic combined systolic (congestive) and diastolic (congestive) heart failure: Secondary | ICD-10-CM | POA: Diagnosis not present

## 2022-10-05 DIAGNOSIS — R072 Precordial pain: Secondary | ICD-10-CM | POA: Diagnosis not present

## 2022-10-05 DIAGNOSIS — I200 Unstable angina: Secondary | ICD-10-CM | POA: Diagnosis present

## 2022-10-05 DIAGNOSIS — Z7982 Long term (current) use of aspirin: Secondary | ICD-10-CM

## 2022-10-05 DIAGNOSIS — M4802 Spinal stenosis, cervical region: Secondary | ICD-10-CM | POA: Diagnosis not present

## 2022-10-05 DIAGNOSIS — R55 Syncope and collapse: Principal | ICD-10-CM | POA: Diagnosis present

## 2022-10-05 DIAGNOSIS — Z8249 Family history of ischemic heart disease and other diseases of the circulatory system: Secondary | ICD-10-CM

## 2022-10-05 DIAGNOSIS — Z9049 Acquired absence of other specified parts of digestive tract: Secondary | ICD-10-CM

## 2022-10-05 DIAGNOSIS — Z955 Presence of coronary angioplasty implant and graft: Secondary | ICD-10-CM | POA: Diagnosis not present

## 2022-10-05 DIAGNOSIS — E119 Type 2 diabetes mellitus without complications: Secondary | ICD-10-CM | POA: Diagnosis not present

## 2022-10-05 DIAGNOSIS — Z7984 Long term (current) use of oral hypoglycemic drugs: Secondary | ICD-10-CM

## 2022-10-05 DIAGNOSIS — I429 Cardiomyopathy, unspecified: Secondary | ICD-10-CM | POA: Diagnosis not present

## 2022-10-05 DIAGNOSIS — Z95820 Peripheral vascular angioplasty status with implants and grafts: Secondary | ICD-10-CM

## 2022-10-05 DIAGNOSIS — Z79899 Other long term (current) drug therapy: Secondary | ICD-10-CM

## 2022-10-05 DIAGNOSIS — Z96611 Presence of right artificial shoulder joint: Secondary | ICD-10-CM | POA: Diagnosis not present

## 2022-10-05 DIAGNOSIS — R079 Chest pain, unspecified: Secondary | ICD-10-CM | POA: Diagnosis present

## 2022-10-05 DIAGNOSIS — I2510 Atherosclerotic heart disease of native coronary artery without angina pectoris: Secondary | ICD-10-CM | POA: Diagnosis present

## 2022-10-05 DIAGNOSIS — F17210 Nicotine dependence, cigarettes, uncomplicated: Secondary | ICD-10-CM | POA: Diagnosis present

## 2022-10-05 DIAGNOSIS — R0789 Other chest pain: Secondary | ICD-10-CM | POA: Diagnosis not present

## 2022-10-05 DIAGNOSIS — I25118 Atherosclerotic heart disease of native coronary artery with other forms of angina pectoris: Secondary | ICD-10-CM | POA: Diagnosis not present

## 2022-10-05 DIAGNOSIS — R001 Bradycardia, unspecified: Secondary | ICD-10-CM | POA: Diagnosis present

## 2022-10-05 DIAGNOSIS — R231 Pallor: Secondary | ICD-10-CM | POA: Diagnosis not present

## 2022-10-05 DIAGNOSIS — Z716 Tobacco abuse counseling: Secondary | ICD-10-CM

## 2022-10-05 DIAGNOSIS — I110 Hypertensive heart disease with heart failure: Secondary | ICD-10-CM | POA: Diagnosis not present

## 2022-10-05 DIAGNOSIS — E7800 Pure hypercholesterolemia, unspecified: Secondary | ICD-10-CM | POA: Diagnosis not present

## 2022-10-05 DIAGNOSIS — Z981 Arthrodesis status: Secondary | ICD-10-CM | POA: Diagnosis not present

## 2022-10-05 DIAGNOSIS — I252 Old myocardial infarction: Secondary | ICD-10-CM | POA: Diagnosis not present

## 2022-10-05 DIAGNOSIS — Z1152 Encounter for screening for COVID-19: Secondary | ICD-10-CM | POA: Diagnosis not present

## 2022-10-05 DIAGNOSIS — Z823 Family history of stroke: Secondary | ICD-10-CM

## 2022-10-05 DIAGNOSIS — K219 Gastro-esophageal reflux disease without esophagitis: Secondary | ICD-10-CM | POA: Diagnosis present

## 2022-10-05 DIAGNOSIS — I213 ST elevation (STEMI) myocardial infarction of unspecified site: Secondary | ICD-10-CM | POA: Diagnosis not present

## 2022-10-05 DIAGNOSIS — Z90711 Acquired absence of uterus with remaining cervical stump: Secondary | ICD-10-CM

## 2022-10-05 DIAGNOSIS — I201 Angina pectoris with documented spasm: Secondary | ICD-10-CM | POA: Diagnosis present

## 2022-10-05 DIAGNOSIS — R1031 Right lower quadrant pain: Secondary | ICD-10-CM | POA: Diagnosis not present

## 2022-10-05 LAB — CBC WITH DIFFERENTIAL/PLATELET
Abs Immature Granulocytes: 0.01 10*3/uL (ref 0.00–0.07)
Basophils Absolute: 0 10*3/uL (ref 0.0–0.1)
Basophils Relative: 0 %
Eosinophils Absolute: 0.1 10*3/uL (ref 0.0–0.5)
Eosinophils Relative: 1 %
HCT: 42.3 % (ref 36.0–46.0)
Hemoglobin: 14.1 g/dL (ref 12.0–15.0)
Immature Granulocytes: 0 %
Lymphocytes Relative: 24 %
Lymphs Abs: 1.6 10*3/uL (ref 0.7–4.0)
MCH: 30.7 pg (ref 26.0–34.0)
MCHC: 33.3 g/dL (ref 30.0–36.0)
MCV: 92 fL (ref 80.0–100.0)
Monocytes Absolute: 0.4 10*3/uL (ref 0.1–1.0)
Monocytes Relative: 6 %
Neutro Abs: 4.7 10*3/uL (ref 1.7–7.7)
Neutrophils Relative %: 69 %
Platelets: 256 10*3/uL (ref 150–400)
RBC: 4.6 MIL/uL (ref 3.87–5.11)
RDW: 14.3 % (ref 11.5–15.5)
WBC: 6.8 10*3/uL (ref 4.0–10.5)
nRBC: 0 % (ref 0.0–0.2)

## 2022-10-05 LAB — RESP PANEL BY RT-PCR (RSV, FLU A&B, COVID)  RVPGX2
Influenza A by PCR: NEGATIVE
Influenza B by PCR: NEGATIVE
Resp Syncytial Virus by PCR: NEGATIVE
SARS Coronavirus 2 by RT PCR: NEGATIVE

## 2022-10-05 LAB — ECHOCARDIOGRAM COMPLETE
Area-P 1/2: 3.03 cm2
Est EF: 45
Height: 59 in
S' Lateral: 4.1 cm
Single Plane A4C EF: 35 %
Weight: 1792 oz

## 2022-10-05 LAB — CBG MONITORING, ED
Glucose-Capillary: 119 mg/dL — ABNORMAL HIGH (ref 70–99)
Glucose-Capillary: 114 mg/dL — ABNORMAL HIGH (ref 70–99)

## 2022-10-05 LAB — BASIC METABOLIC PANEL
Anion gap: 11 (ref 5–15)
BUN: 11 mg/dL (ref 8–23)
CO2: 23 mmol/L (ref 22–32)
Calcium: 9.3 mg/dL (ref 8.9–10.3)
Chloride: 106 mmol/L (ref 98–111)
Creatinine, Ser: 0.68 mg/dL (ref 0.44–1.00)
GFR, Estimated: >60 mL/min (ref >60)
Glucose, Bld: 114 mg/dL — ABNORMAL HIGH (ref 70–99)
Potassium: 4 mmol/L (ref 3.5–5.1)
Sodium: 140 mmol/L (ref 135–145)

## 2022-10-05 LAB — TROPONIN I (HIGH SENSITIVITY)
Troponin I (High Sensitivity): 9 ng/L (ref <18)
Troponin I (High Sensitivity): 16 ng/L (ref <18)

## 2022-10-05 LAB — MAGNESIUM: Magnesium: 1.9 mg/dL (ref 1.7–2.4)

## 2022-10-05 MED ORDER — AMLODIPINE BESYLATE 2.5 MG PO TABS
2.5000 mg | ORAL_TABLET | Freq: Every day | ORAL | Status: DC
  Administered 2022-10-06 – 2022-10-07 (×2): 2.5 mg via ORAL
  Filled 2022-10-05 (×2): qty 1

## 2022-10-05 MED ORDER — ENOXAPARIN SODIUM 40 MG/0.4ML IJ SOSY
40.0000 mg | PREFILLED_SYRINGE | INTRAMUSCULAR | Status: DC
  Administered 2022-10-05: 40 mg via SUBCUTANEOUS
  Filled 2022-10-05: qty 0.4

## 2022-10-05 MED ORDER — METHOCARBAMOL 500 MG PO TABS
500.0000 mg | ORAL_TABLET | Freq: Four times a day (QID) | ORAL | Status: DC
  Administered 2022-10-05 – 2022-10-07 (×6): 500 mg via ORAL
  Filled 2022-10-05 (×6): qty 1

## 2022-10-05 MED ORDER — TRAZODONE HCL 100 MG PO TABS
100.0000 mg | ORAL_TABLET | Freq: Every evening | ORAL | Status: DC | PRN
  Administered 2022-10-06: 100 mg via ORAL
  Filled 2022-10-05: qty 1

## 2022-10-05 MED ORDER — ONDANSETRON 4 MG PO TBDP: 8.0000 mg | ORAL_TABLET | Freq: Three times a day (TID) | ORAL | Status: DC | PRN

## 2022-10-05 MED ORDER — METHOCARBAMOL 500 MG PO TABS: 500.0000 mg | ORAL_TABLET | Freq: Four times a day (QID) | ORAL | Status: DC

## 2022-10-05 MED ORDER — ONDANSETRON HCL 4 MG/2ML IJ SOLN: 4.0000 mg | Freq: Four times a day (QID) | INTRAMUSCULAR | Status: DC | PRN

## 2022-10-05 MED ORDER — SENNOSIDES-DOCUSATE SODIUM 8.6-50 MG PO TABS: 1.0000 | ORAL_TABLET | Freq: Every evening | ORAL | Status: DC | PRN

## 2022-10-05 MED ORDER — ACETAMINOPHEN 325 MG PO TABS
650.0000 mg | ORAL_TABLET | ORAL | Status: DC | PRN
  Administered 2022-10-06: 650 mg via ORAL
  Filled 2022-10-05: qty 2

## 2022-10-05 MED ORDER — ASPIRIN 325 MG PO TBEC
325.0000 mg | DELAYED_RELEASE_TABLET | Freq: Every day | ORAL | Status: DC
  Administered 2022-10-06: 325 mg via ORAL
  Filled 2022-10-05: qty 1

## 2022-10-05 MED ORDER — ROSUVASTATIN CALCIUM 5 MG PO TABS
5.0000 mg | ORAL_TABLET | Freq: Every day | ORAL | Status: DC
  Administered 2022-10-06: 5 mg via ORAL
  Filled 2022-10-05: qty 1

## 2022-10-05 MED ORDER — PANTOPRAZOLE SODIUM 40 MG PO TBEC
40.0000 mg | DELAYED_RELEASE_TABLET | Freq: Two times a day (BID) | ORAL | Status: DC
  Administered 2022-10-06 – 2022-10-07 (×4): 40 mg via ORAL
  Filled 2022-10-05 (×4): qty 1

## 2022-10-05 MED ORDER — BUPROPION HCL ER (SMOKING DET) 150 MG PO TB12: 150.0000 mg | ORAL_TABLET | Freq: Every morning | ORAL | Status: DC

## 2022-10-05 MED ORDER — HYDROCODONE-ACETAMINOPHEN 5-325 MG PO TABS
1.0000 | ORAL_TABLET | Freq: Four times a day (QID) | ORAL | Status: DC | PRN
  Administered 2022-10-05: 1 via ORAL
  Filled 2022-10-05: qty 1

## 2022-10-05 MED ORDER — INSULIN ASPART 100 UNIT/ML IJ SOLN: 0.0000 [IU] | Freq: Three times a day (TID) | INTRAMUSCULAR | Status: DC

## 2022-10-05 MED ORDER — OXYCODONE HCL 5 MG PO TABS
5.0000 mg | ORAL_TABLET | ORAL | Status: DC | PRN
  Administered 2022-10-06 (×4): 5 mg via ORAL
  Filled 2022-10-05 (×4): qty 1

## 2022-10-05 NOTE — ED Notes (Signed)
Patient transported to X-ray 

## 2022-10-05 NOTE — Progress Notes (Incomplete)
Rounding Note    Patient Name: [name] Date of Encounter: 10/05/2022  [place] Cardiologist: None ***  Subjective   ***  Inpatient Medications    Scheduled Meds:  [START ON 10/06/2022] amLODipine  2.5 mg Oral Daily   [START ON 10/06/2022] aspirin EC  325 mg Oral Daily   [START ON 10/06/2022] buPROPion  150 mg Oral q morning   enoxaparin (LOVENOX) injection  40 mg Subcutaneous Q24H   insulin aspart  0-15 Units Subcutaneous TID WC   methocarbamol  500 mg Oral QID   pantoprazole  40 mg Oral BID   [START ON 10/06/2022] rosuvastatin  5 mg Oral q1800   Continuous Infusions:  PRN Meds: acetaminophen, HYDROcodone-acetaminophen, ondansetron (ZOFRAN) IV, ondansetron, oxyCODONE, senna-docusate, traZODone   Vital Signs    Vitals:   10/05/22 1331 10/05/22 1400 10/05/22 1748 10/05/22 1830  BP:      Pulse:  64  61  Resp:  19  19  Temp:   98.6 F (37 C)   TempSrc:      SpO2:  97%  97%  Weight: 50.8 kg     Height: '4\' 11"'$  (1.499 m)      No intake or output data in the 24 hours ending 10/05/22 1946    10/05/2022    1:31 PM 04/07/2022    9:56 AM 03/31/2022    8:34 AM  Last 3 Weights  Weight (lbs) 112 lb 112 lb 110 lb 14.4 oz  Weight (kg) 50.803 kg 50.803 kg 50.304 kg      Telemetry    *** - Personally Reviewed  ECG    *** - Personally Reviewed  Physical Exam  *** GEN: No acute distress.   Neck: No JVD Cardiac: RRR, no murmurs, rubs, or gallops.  Respiratory: Clear to auscultation bilaterally. GI: Soft, nontender, non-distended  MS: No edema; No deformity. Neuro:  Nonfocal  Psych: Normal affect   Labs    High Sensitivity Troponin:   Recent Labs  Lab 10/05/22 1344 10/05/22 1603  TROPONINIHS 9 16     Chemistry Recent Labs  Lab 10/05/22 1332  NA 140  K 4.0  CL 106  CO2 23  GLUCOSE 114*  BUN 11  CREATININE 0.68  CALCIUM 9.3  GFRNONAA >60  ANIONGAP 11    Lipids No results for input(s): "CHOL", "TRIG", "HDL", "LABVLDL",  "LDLCALC", "CHOLHDL" in the last 168 hours.  Hematology Recent Labs  Lab 10/05/22 1332  WBC 6.8  RBC 4.60  HGB 14.1  HCT 42.3  MCV 92.0  MCH 30.7  MCHC 33.3  RDW 14.3  PLT 256   Thyroid No results for input(s): "TSH", "FREET4" in the last 168 hours.  BNPNo results for input(s): "BNP", "PROBNP" in the last 168 hours.  DDimer No results for input(s): "DDIMER" in the last 168 hours.   Radiology    ECHOCARDIOGRAM COMPLETE  Result Date: 10/05/2022    ECHOCARDIOGRAM REPORT   Patient Name:   [name] Date of Exam: 10/05/2022 Medical Rec #:  [id number]       Height:       59.0 in Accession #:    [id number]      Weight:       112.0 lb Date of Birth:  August 22, 1945       BSA:          1.442 m Patient Age:    77 years        BP:  109/61 mmHg Patient Gender: F               HR:           65 bpm. Exam Location:  Inpatient Procedure: 2D Echo Indications:    chest pain  History:        Patient has prior history of Echocardiogram examinations, most                 recent 08/29/2021. CAD, Signs/Symptoms:Syncope; Risk                 Factors:Current Smoker and Hypertension.  Sonographer:    Johny Chess RDCS Referring Phys: [id number] Lequita Halt  Sonographer Comments: Global longitudinal strain was attempted. IMPRESSIONS  1. Left ventricular ejection fraction, by estimation, is 45%. The left ventricle has mildly decreased function. The left ventricle demonstrates regional wall motion abnormalities (see scoring diagram/findings for description). Left ventricular diastolic  parameters are consistent with Grade I diastolic dysfunction (impaired relaxation). The average left ventricular global longitudinal strain is -13.0 %. The global longitudinal strain is abnormal.  2. Abnormal inferior and inferolateral regional strain.  3. Right ventricular systolic function is normal. The right ventricular size is normal. There is normal pulmonary artery systolic pressure. The estimated right ventricular  systolic pressure is 123XX123 mmHg.  4. The mitral valve is grossly normal. Trivial mitral valve regurgitation.  5. The aortic valve is tricuspid. Aortic valve regurgitation is not visualized. No aortic stenosis is present.  6. The inferior vena cava is normal in size with greater than 50% respiratory variability, suggesting right atrial pressure of 3 mmHg. FINDINGS  Left Ventricle: Left ventricular ejection fraction, by estimation, is 45%. The left ventricle has mildly decreased function. The left ventricle demonstrates regional wall motion abnormalities. The average left ventricular global longitudinal strain is -13.0 %. The global longitudinal strain is abnormal. The left ventricular internal cavity size was normal in size. There is no left ventricular hypertrophy. Left ventricular diastolic parameters are consistent with Grade I diastolic dysfunction (impaired  relaxation).  LV Wall Scoring: The entire lateral wall is akinetic. Right Ventricle: The right ventricular size is normal. No increase in right ventricular wall thickness. Right ventricular systolic function is normal. There is normal pulmonary artery systolic pressure. The tricuspid regurgitant velocity is 1.87 m/s, and  with an assumed right atrial pressure of 3 mmHg, the estimated right ventricular systolic pressure is 123XX123 mmHg. Left Atrium: Left atrial size was normal in size. Right Atrium: Right atrial size was normal in size. Pericardium: Trivial pericardial effusion is present. The pericardial effusion is posterior to the left ventricle. Mitral Valve: 2D MVA 2.10 cm. The mitral valve is grossly normal. Trivial mitral valve regurgitation. Tricuspid Valve: The tricuspid valve is grossly normal. Tricuspid valve regurgitation is not demonstrated. No evidence of tricuspid stenosis. Aortic Valve: The aortic valve is tricuspid. Aortic valve regurgitation is not visualized. No aortic stenosis is present. Pulmonic Valve: The pulmonic valve was not well  visualized. Pulmonic valve regurgitation is not visualized. No evidence of pulmonic stenosis. Aorta: The aortic root and ascending aorta are structurally normal, with no evidence of dilitation. Venous: The inferior vena cava is normal in size with greater than 50% respiratory variability, suggesting right atrial pressure of 3 mmHg. IAS/Shunts: No atrial level shunt detected by color flow Doppler.  LEFT VENTRICLE PLAX 2D LVIDd:         4.70 cm     Diastology LVIDs:  4.10 cm     LV e' medial:  6.53 cm/s LV PW:         0.90 cm     LV e' lateral: 9.46 cm/s LV IVS:        0.90 cm LVOT diam:     1.70 cm     2D Longitudinal Strain LV SV:         47          2D Strain GLS Avg:     -13.0 % LV SV Index:   33 LVOT Area:     2.27 cm  LV Volumes (MOD) LV vol d, MOD A4C: 68.0 ml LV vol s, MOD A4C: 44.2 ml LV SV MOD A4C:     68.0 ml RIGHT VENTRICLE             IVC RV Basal diam:  2.30 cm     IVC diam: 1.20 cm RV S prime:     10.80 cm/s TAPSE (M-mode): 1.9 cm LEFT ATRIUM             Index        RIGHT ATRIUM          Index LA diam:        3.80 cm 2.64 cm/m   RA Area:     9.10 cm LA Vol (A2C):   38.6 ml 26.78 ml/m  RA Volume:   17.70 ml 12.28 ml/m LA Vol (A4C):   28.0 ml 19.42 ml/m LA Biplane Vol: 33.6 ml 23.31 ml/m  AORTIC VALVE LVOT Vmax:   102.00 cm/s LVOT Vmean:  63.400 cm/s LVOT VTI:    0.209 m  AORTA Ao Root diam: 2.60 cm Ao Asc diam:  2.50 cm MITRAL VALVE                TRICUSPID VALVE MV Area (PHT): 3.03 cm     TR Peak grad:   14.0 mmHg MV Decel Time: 250 msec     TR Vmax:        187.00 cm/s MV A velocity: 132.00 cm/s                             SHUNTS                             Systemic VTI:  0.21 m                             Systemic Diam: 1.70 cm Rudean Haskell MD Electronically signed by Rudean Haskell MD Signature Date/Time: 10/05/2022/6:19:20 PM    Final    CT Head Wo Contrast  Result Date: 10/05/2022 CLINICAL DATA:  Syncopal episode. EXAM: CT HEAD WITHOUT CONTRAST TECHNIQUE: Contiguous  axial images were obtained from the base of the skull through the vertex without intravenous contrast. RADIATION DOSE REDUCTION: This exam was performed according to the departmental dose-optimization program which includes automated exposure control, adjustment of the mA and/or kV according to patient size and/or use of iterative reconstruction technique. COMPARISON:  12/21/2016 FINDINGS: Brain: No evidence of intracranial hemorrhage, acute infarction, hydrocephalus, extra-axial collection, or mass lesion/mass effect. Mild diffuse cerebral atrophy appears stable. Vascular:  No hyperdense vessel or other acute findings. Skull: No evidence of fracture or other significant bone abnormality. Sinuses/Orbits:  No acute findings. Other: None. IMPRESSION: No acute intracranial abnormality. Stable  mild cerebral atrophy. Electronically Signed   By: Marlaine Hind M.D.   On: 10/05/2022 15:04   DG Chest 2 View  Result Date: 10/05/2022 CLINICAL DATA:  Chest pain EXAM: CHEST - 2 VIEW COMPARISON:  Chest x-rays dated 06/21/2021 and 05/15/2019 FINDINGS: Cardiomegaly, grossly stable. Lungs are clear. No pleural effusion or pneumothorax is seen. No acute-appearing osseous abnormality. IMPRESSION: No active cardiopulmonary disease. No evidence of pneumonia or pulmonary edema. Cardiomegaly. Electronically Signed   By: Franki Cabot M.D.   On: 10/05/2022 14:38    Cardiac Studies   TTE 10/05/22: IMPRESSIONS     1. Left ventricular ejection fraction, by estimation, is 45%. The left  ventricle has mildly decreased function. The left ventricle demonstrates  regional wall motion abnormalities (see scoring diagram/findings for  description). Left ventricular diastolic   parameters are consistent with Grade I diastolic dysfunction (impaired  relaxation). The average left ventricular global longitudinal strain is  -13.0 %. The global longitudinal strain is abnormal.   2. Abnormal inferior and inferolateral regional strain.    3. Right ventricular systolic function is normal. The right ventricular  size is normal. There is normal pulmonary artery systolic pressure. The  estimated right ventricular systolic pressure is 123XX123 mmHg.   4. The mitral valve is grossly normal. Trivial mitral valve  regurgitation.   5. The aortic valve is tricuspid. Aortic valve regurgitation is not  visualized. No aortic stenosis is present.   6. The inferior vena cava is normal in size with greater than 50%  respiratory variability, suggesting right atrial pressure of 3 mmHg.   Coronary CTA 08/2021: FINDINGS: Coronary calcium score: The patient's coronary artery calcium score is 408, which places the patient in the 89 percentile.   Coronary arteries: Normal coronary origins.  Right dominance.   Right Coronary Artery: Normal caliber vessel, gives rise to PDA. There is a stent in the mid RCA which appears largely patent but cannot be fully evaluated. Mixed calcified and noncalcified plaque in mid RCA with 25-49% stenosis.   Left Main Coronary Artery: Normal caliber vessel. No significant plaque or stenosis.   Left Anterior Descending Coronary Artery: Normal caliber vessel. Scattered calcified plaque throughout proximal LAD with maximum 25-49% stenosis. Gives rise to small first, large second and small third diagonal branches. There is noncalcified plaque in the ostial D1 with 25-49% stenosis. Distal LAD wraps apex.   Left Circumflex Artery: Normal caliber vessel. Trivial calcified and noncalcified plaque in proximal LCx with 1-24% stenosis. Gives rise to 2 small OM branches.   Aorta: Normal size, 26 mm at the mid ascending aorta (level of the PA bifurcation) measured double oblique. Calcifications consistent with aortic atherosclerosis. No dissection seen in visualized portions of the aorta.   Aortic Valve: No calcifications. Trileaflet.   Other findings:   Normal pulmonary vein drainage into the left atrium.   Normal  left atrial appendage without a thrombus.   Normal size of the pulmonary artery.   Normal appearance of the pericardium.   IMPRESSION: 1. 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 Presence of coronary stent in mid RCA.   2. Coronary calcium score of 408. This was 74 percentile for age and sex matched control.   3. Normal coronary origin with right dominance.   4.  Aortic atherosclerosis.   5. Wall motion evaluated through 35-75% of RR interval. Focal hypokinesis in the mid inferior, inferolateral, and anterolateral wall. Recommend correlation with other imaging modality.  Patient Profile     77 y.o. female ***  Assessment & Plan    *** {Are we signing off today?:[id number]}  For questions or updates, please contact [place] Please consult [email or web address] for contact info under        Signed, Freada Bergeron, MD  10/05/2022, 7:46 PM

## 2022-10-05 NOTE — H&P (Signed)
History and Physical    [name] [id number] DOB: March 03, 1946 DOA: 10/05/2022  PCP: [name], MD (Confirm with patient/family/NH records and if not entered, this has to be entered at [facility] point of entry) Patient coming from: Home  I have personally briefly reviewed patient's old medical records in [place]  Chief Complaint: Passed out, chest pain after that  HPI: [name] is a 77 y.o. 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  Patient was in the church service this morning, standing, singing suddenly "feeling sick" and passed out.  Bystanders were able to catch her and lowered her down slowly and no fall.  Patient herself however cannot describe whether she had lightheaded blurry vision or nauseous vomiting or palpitation other than " feeling sick".  Bystanders did tell her that she looked pale and cool to touch and her breathing was slow and heavy.  EMS arrived and found the patient breathing heavily and placed her on oxygen.  And slowly patient woke up, but does not recall any part of the event.  No tongue biting no loss control of urine or bowel movements.  After she recovered her consciousness, she started to develop centrally located sharp-like chest pain 3-4/10, nonradiating no accompanying symptoms such as palpitations sweating nauseous vomiting.  She has been taking the same home medications with no changes recently.  She felt as usual yesterday and this morning before she went to the church.  Patient further reported that her cervical spine stenosis/radiculopathy has been fairly controlled no pain no weakness reported.  ED Course: Blood pressure 106/64, none tachycardia.  Monitor showed borderline bradycardia.  Troponin first set negative.  EKG showed T wave inversions on  lateral leads V4-V6 appears to be chronic.  Review of Systems: As per HPI otherwise 14 point review of systems negative.    Past Medical History:  Diagnosis Date   Anemia    Anginal pain ([place])    last cp was last year   Anxiety    Arthritis    Blood transfusion    Chest pain 02/04/2016   Coronary artery disease    Coronary artery spasm, wth continued episodes of chest pain.  09/26/2013   Coronary vasospasm (HCC)    Diabetes mellitus without complication (HCC)    type II   GERD (gastroesophageal reflux disease)    Headache    Hiatal hernia    Hypercholesteremia    Hypertension    NSTEMI (non-ST elevated myocardial infarction) ([place]) not sure   NSVT (nonsustained ventricular tachycardia) ([place]) 09/26/2013   Panic attack     Past Surgical History:  Procedure Laterality Date   ABDOMINAL HYSTERECTOMY     PARTIAL HYSTERECTOMY   ACDF N/A    C5-6 ACDF Dr. Sherwood Gambler   breast     right, tumor benign   BREAST EXCISIONAL BIOPSY Right    1961 (age 47) benign   CARDIAC CATHETERIZATION  2014   CARDIAC CATHETERIZATION  2012   CARDIAC CATHETERIZATION N/A 07/22/2016   Procedure: Left Heart Cath and Coronary Angiography;  Surgeon: Belva Crome, MD;  Location: [place] [facility];  Service: Cardiovascular;  Laterality: N/A;   cardiac stent  2012   to RCA   CHOLECYSTECTOMY N/A 10/02/2017   Procedure: LAPAROSCOPIC CHOLECYSTECTOMY;  Surgeon: Ralene Ok, MD;  Location: [place];  Service: General;  Laterality: N/A;   CORONARY ANGIOPLASTY     ESOPHAGEAL MANOMETRY N/A 10/29/2016   Procedure: ESOPHAGEAL MANOMETRY (EM);  Surgeon: Garlan Fair, MD;  Location: [facility];  Service: Endoscopy;  Laterality: N/A;   ESOPHAGOGASTRODUODENOSCOPY (EGD) WITH PROPOFOL N/A 10/28/2016   Procedure: ESOPHAGOGASTRODUODENOSCOPY (EGD) WITH PROPOFOL;  Surgeon: Garlan Fair, MD;  Location: [facility];  Service: Endoscopy;  Laterality: N/A;   EXCISION OF SKIN TAG N/A 07/29/2017   Procedure: EXCISION OF  PERIANAL  SKIN TAG;  Surgeon: Ralene Ok, MD;  Location: [place];  Service: General;  Laterality: N/A;   KNEE SURGERY  left   left ear surgery     for bad cut   LEFT HEART CATHETERIZATION WITH CORONARY ANGIOGRAM N/A 02/22/2013   Procedure: LEFT HEART CATHETERIZATION WITH CORONARY ANGIOGRAM;  Surgeon: [name], MD;  Location: [facility];  Service: Cardiovascular;  Laterality: N/A;   REVERSE SHOULDER ARTHROPLASTY Right 04/07/2022   Procedure: RIGHT SHOULDER REVERSE SHOULDER ARTHROPLASTY;  Surgeon: Vanetta Mulders, MD;  Location: [place];  Service: Orthopedics;  Laterality: Right;   SHOULDER SURGERY Bilateral    rotator cuff   TOE SURGERY Left    toe surgery     reports that she has been smoking cigarettes. She has a 20.00 pack-year smoking history. She has never used smokeless tobacco. She reports current alcohol use of about 1.0 standard drink of alcohol per week. She reports that she does not use drugs.  No Known Allergies  Family History  Problem Relation Age of Onset   Heart failure Mother    Stroke Sister    Heart attack Other      Prior to Admission medications   Medication Sig Start Date End Date Taking? Authorizing Provider  ACCU-CHEK GUIDE test strip USE TO CHECK BLOOD SUGAR DAILY 11/05/20   [provider]  Accu-Chek Softclix Lancets lancets daily. 11/05/20   [provider]  amLODipine (NORVASC) 2.5 MG tablet Take 1 tablet (2.5 mg total) by mouth daily. 01/25/17   Eugenie Filler, MD  aspirin EC 325 MG tablet Take 1 tablet (325 mg total) by mouth daily. 04/07/22   Vanetta Mulders, MD  buPROPion (ZYBAN) 150 MG 12 hr tablet Take 150 mg by mouth every morning. 06/25/20   [provider]  dapagliflozin propanediol (FARXIGA) 10 MG TABS tablet Take 1 tablet (10 mg total) by mouth daily before breakfast. 06/18/22   Belva Crome, MD  HYDROcodone-acetaminophen (NORCO/VICODIN) 5-325 MG tablet Take 1 tablet by mouth every 6 (six) hours as needed  for moderate pain. 04/08/22   Vanetta Mulders, MD  HYDROcodone-acetaminophen (NORCO/VICODIN) 5-325 MG tablet Take 1 tablet by mouth every 6 (six) hours as needed for moderate pain. 04/21/22   Vanetta Mulders, MD  metFORMIN (GLUCOPHAGE-XR) 500 MG 24 hr tablet Take 500 mg by mouth 2 (two) times daily. 06/28/20   [provider]  methocarbamol (ROBAXIN) 500 MG tablet Take 1 tablet (500 mg total) by mouth 4 (four) times daily. 04/09/22   Vanetta Mulders, MD  methocarbamol (ROBAXIN) 500 MG tablet Take 1 tablet (500 mg total) by mouth 4 (four) times daily. 06/06/22   Vanetta Mulders, MD  nitroGLYCERIN (NITROSTAT) 0.4 MG SL tablet PLACE 1 TABLET UNDER TONGUE AS NEEDED FOR CHEST PAIN EVERY 5 MINUTES UP TO 3 DOSES THEN SEEK MEDICAL ATTENTION. Please make overdue appt. 1st attempt 02/18/19   Belva Crome, MD  ondansetron (ZOFRAN-ODT) 8 MG disintegrating tablet Take 1 tablet (8 mg total) by mouth every  8 (eight) hours as needed for nausea or vomiting. 04/08/22   Vanetta Mulders, MD  oxyCODONE (ROXICODONE) 5 MG immediate release tablet Take 1 tablet (5 mg total) by mouth every 4 (four) hours as needed for severe pain or breakthrough pain. 04/07/22   Aundra Dubin, PA-C  pantoprazole (PROTONIX) 40 MG tablet Take 1 tablet (40 mg total) by mouth 2 (two) times daily. 04/20/14   Isaiah Serge, NP  pregabalin (LYRICA) 50 MG capsule Take 1 cap by mouth at night for 7 nights then take 1 cap by mouth in the morning and 1 at night. Patient not taking: Reported on 03/17/2022 02/12/22   Persons, Bevely Palmer, [place]  ranolazine (RANEXA) 500 MG 12 hr tablet TAKE 1 TABLET(500 MG) BY MOUTH TWICE DAILY Patient not taking: Reported on 03/17/2022 02/13/21   Belva Crome, MD  rosuvastatin (CRESTOR) 5 MG tablet Take 1 tablet (5 mg total) by mouth daily at 6 PM. 01/24/17   Eugenie Filler, MD  senna-docusate (SENOKOT-S) 8.6-50 MG tablet Take 1 tablet by mouth at bedtime as needed for mild constipation or moderate constipation. Patient  not taking: Reported on 03/17/2022 10/29/20   Margette Fast, MD  traZODone (DESYREL) 50 MG tablet Take 100 mg by mouth at bedtime as needed for sleep.    [provider]    Physical Exam: Vitals:   10/05/22 1330 10/05/22 1331 10/05/22 1400  BP: 109/61    Pulse: 69  64  Resp: 14  19  Temp: 97.7 F (36.5 C)    TempSrc: Oral    SpO2: 98%  97%  Weight:  50.8 kg   Height:  '4\' 11"'$  (1.499 m)     Constitutional: NAD, calm, comfortable Vitals:   10/05/22 1330 10/05/22 1331 10/05/22 1400  BP: 109/61    Pulse: 69  64  Resp: 14  19  Temp: 97.7 F (36.5 C)    TempSrc: Oral    SpO2: 98%  97%  Weight:  50.8 kg   Height:  '4\' 11"'$  (1.499 m)    Eyes: PERRL, lids and conjunctivae normal ENMT: Mucous membranes are moist. Posterior pharynx clear of any exudate or lesions.Normal dentition.  Neck: normal, supple, no masses, no thyromegaly Respiratory: clear to auscultation bilaterally, no wheezing, no crackles. Normal respiratory effort. No accessory muscle use.  Cardiovascular: Regular rate and rhythm, no murmurs / rubs / gallops. No extremity edema. 2+ pedal pulses. No carotid bruits.  Abdomen: no tenderness, no masses palpated. No hepatosplenomegaly. Bowel sounds positive.  Musculoskeletal: no clubbing / cyanosis. No joint deformity upper and lower extremities. Good ROM, no contractures. Normal muscle tone.  Skin: no rashes, lesions, ulcers. No induration Neurologic: CN 2-12 grossly intact. Sensation intact, DTR normal. Strength 5/5 in all 4.  Psychiatric: Normal judgment and insight. Alert and oriented x 3. Normal mood.     Labs on Admission: I have personally reviewed following labs and imaging studies  CBC: Recent Labs  Lab 10/05/22 1332  WBC 6.8  NEUTROABS 4.7  HGB 14.1  HCT 42.3  MCV 92.0  PLT 123456   Basic Metabolic Panel: Recent Labs  Lab 10/05/22 1332  NA 140  K 4.0  CL 106  CO2 23  GLUCOSE 114*  BUN 11  CREATININE 0.68  CALCIUM 9.3   GFR: Estimated  Creatinine Clearance: 40.8 mL/min (by C-G formula based on SCr of 0.68 mg/dL). Liver Function Tests: No results for input(s): "AST", "ALT", "ALKPHOS", "BILITOT", "PROT", "ALBUMIN" in the last 168 hours.  No results for input(s): "LIPASE", "AMYLASE" in the last 168 hours. No results for input(s): "AMMONIA" in the last 168 hours. Coagulation Profile: No results for input(s): "INR", "PROTIME" in the last 168 hours. Cardiac Enzymes: No results for input(s): "CKTOTAL", "CKMB", "CKMBINDEX", "TROPONINI" in the last 168 hours. BNP (last 3 results) No results for input(s): "PROBNP" in the last 8760 hours. HbA1C: No results for input(s): "HGBA1C" in the last 72 hours. CBG: Recent Labs  Lab 10/05/22 1355  GLUCAP 119*   Lipid Profile: No results for input(s): "CHOL", "HDL", "LDLCALC", "TRIG", "CHOLHDL", "LDLDIRECT" in the last 72 hours. Thyroid Function Tests: No results for input(s): "TSH", "T4TOTAL", "FREET4", "T3FREE", "THYROIDAB" in the last 72 hours. Anemia Panel: No results for input(s): "VITAMINB12", "FOLATE", "FERRITIN", "TIBC", "IRON", "RETICCTPCT" in the last 72 hours. Urine analysis:    Component Value Date/Time   COLORURINE YELLOW 10/29/2020 1357   APPEARANCEUR CLEAR 10/29/2020 1357   LABSPEC >1.046 (H) 10/29/2020 1357   PHURINE 5.0 10/29/2020 1357   GLUCOSEU NEGATIVE 10/29/2020 1357   HGBUR NEGATIVE 10/29/2020 1357   [place] 10/29/2020 1357   KETONESUR NEGATIVE 10/29/2020 1357   PROTEINUR NEGATIVE 10/29/2020 1357   UROBILINOGEN 1.0 03/09/2013 1215   NITRITE NEGATIVE 10/29/2020 1357   LEUKOCYTESUR MODERATE (A) 10/29/2020 1357    Radiological Exams on Admission: CT Head Wo Contrast  Result Date: 10/05/2022 CLINICAL DATA:  Syncopal episode. EXAM: CT HEAD WITHOUT CONTRAST TECHNIQUE: Contiguous axial images were obtained from the base of the skull through the vertex without intravenous contrast. RADIATION DOSE REDUCTION: This exam was performed according to the  departmental dose-optimization program which includes automated exposure control, adjustment of the mA and/or kV according to patient size and/or use of iterative reconstruction technique. COMPARISON:  12/21/2016 FINDINGS: Brain: No evidence of intracranial hemorrhage, acute infarction, hydrocephalus, extra-axial collection, or mass lesion/mass effect. Mild diffuse cerebral atrophy appears stable. Vascular:  No hyperdense vessel or other acute findings. Skull: No evidence of fracture or other significant bone abnormality. Sinuses/Orbits:  No acute findings. Other: None. IMPRESSION: No acute intracranial abnormality. Stable mild cerebral atrophy. Electronically Signed   By: Marlaine Hind M.D.   On: 10/05/2022 15:04   DG Chest 2 View  Result Date: 10/05/2022 CLINICAL DATA:  Chest pain EXAM: CHEST - 2 VIEW COMPARISON:  Chest x-rays dated 06/21/2021 and 05/15/2019 FINDINGS: Cardiomegaly, grossly stable. Lungs are clear. No pleural effusion or pneumothorax is seen. No acute-appearing osseous abnormality. IMPRESSION: No active cardiopulmonary disease. No evidence of pneumonia or pulmonary edema. Cardiomegaly. Electronically Signed   By: Franki Cabot M.D.   On: 10/05/2022 14:38    EKG: Independently reviewed.  Sinus, T wave inversions on V4-V6  Assessment/Plan Active Problems:   Coronary artery vasospasm (HCC)   Syncope, vasovagal   Coronary artery disease   S/P angioplasty with stent   Chest pain  (please populate well all problems here in Problem List. (For example, if patient is on BP meds at home and you resume or decide to hold them, it is a problem that needs to be her. Same for CAD, COPD, HLD and so on)  Atypical chest pain -Rule out ACS with history of CAD and RCA stenting and history of coronary artery spasm in the past.  However it appears that patient has been off Ranexa -Continue aspirin statin -Case was discussed with on-call cardiology Dr. Lovena Le, who agreed to see the patient this  afternoon  Syncope -Probably vasovagal -Volume status appears to be neutral -Check orthostatic vital signs, will continue  home BP meds -Other DDx, telemonitoring x 24 hours to rule out arrhythmia, otherwise cervical spine problem appears to be controlled unlikely to cause with medical problems at this point.  Will check carotid Doppler, and echocardiogram  CAD -As above.  Chronic combined HFrEF and HFpEF -Euvolemic, not on diuresis.  Amlodipine, not on beta-blocker prophy for her chronic sinus bradycardia  IIDM -Hold off metformin for possible inpatient cardiology workup -Sliding scale for now.  DVT prophylaxis: Lovenox Code Status: Full code Family Communication: Husband at bedside Disposition Plan: Expect less than 2 midnight hospital stay Consults called: Cardiology Admission status: Telemetry observation   Lequita Halt MD [facility] Pager [phone]  10/05/2022, 4:59 PM

## 2022-10-05 NOTE — ED Notes (Signed)
Up to BR tolerated well

## 2022-10-05 NOTE — ED Notes (Signed)
Pain med given 

## 2022-10-05 NOTE — ED Notes (Signed)
Pts handoff filled out

## 2022-10-05 NOTE — ED Triage Notes (Addendum)
Per EMS, Pt presents after a syncopal episode at church and sharp L chest pain.  Pain score 6/10.  Pt reports "feeling hot" prior to LOC while singing at church.   Denies associated symptoms.   324 Aspirin given en route

## 2022-10-05 NOTE — ED Provider Notes (Signed)
[facility] Provider Note   CSN: [id number] Arrival date & time: 10/05/22  1312     History  Chief Complaint  Patient presents with   Loss of Consciousness   Chest Pain    [name] is a 77 y.o. female with a past medical history very hypertension, hyperlipidemia, type 2 diabetes, NSTEMI, anxiety presents the ED post syncopal episode at church today.  Patient reports that she was singing in the choir when she suddenly felt nauseated and diaphoretic and woke up on the ground.  She does not remember the syncopal episode but believes bystanders were able to assist her to the ground.  She states that following the episode she noted some pain to the left side of her chest but this has since resolved.  She states that she has had the symptoms before but it has been many years ago and they were attributed to anxiety.  She states that currently she is feeling at her baseline. She denies focal weakness, headache, cough, congestion, fever, chills, shortness of breath, leg pain or swelling, or any other recent illness or injury.  She states that she has been eating and drinking well at home but prior to discharge this morning only had a cup of coffee and nothing else.  She reports that this is typical for her.  RN reports that patient has history of NSTEMI and patient confirms that she has had 1 cardiac stent placed in the past but does not believe that symptoms feel similar to that previous episode.      Home Medications Prior to Admission medications   Medication Sig Start Date End Date Taking? Authorizing Provider  ACCU-CHEK GUIDE test strip USE TO CHECK BLOOD SUGAR DAILY 11/05/20   [provider]  Accu-Chek Softclix Lancets lancets daily. 11/05/20   [provider]  amLODipine (NORVASC) 2.5 MG tablet Take 1 tablet (2.5 mg total) by mouth daily. 01/25/17   Eugenie Filler, MD  aspirin EC 325 MG tablet Take 1 tablet (325 mg  total) by mouth daily. 04/07/22   Vanetta Mulders, MD  buPROPion (ZYBAN) 150 MG 12 hr tablet Take 150 mg by mouth every morning. 06/25/20   [provider]  dapagliflozin propanediol (FARXIGA) 10 MG TABS tablet Take 1 tablet (10 mg total) by mouth daily before breakfast. 06/18/22   Belva Crome, MD  HYDROcodone-acetaminophen (NORCO/VICODIN) 5-325 MG tablet Take 1 tablet by mouth every 6 (six) hours as needed for moderate pain. 04/08/22   Vanetta Mulders, MD  HYDROcodone-acetaminophen (NORCO/VICODIN) 5-325 MG tablet Take 1 tablet by mouth every 6 (six) hours as needed for moderate pain. 04/21/22   Vanetta Mulders, MD  metFORMIN (GLUCOPHAGE-XR) 500 MG 24 hr tablet Take 500 mg by mouth 2 (two) times daily. 06/28/20   [provider]  methocarbamol (ROBAXIN) 500 MG tablet Take 1 tablet (500 mg total) by mouth 4 (four) times daily. 04/09/22   Vanetta Mulders, MD  methocarbamol (ROBAXIN) 500 MG tablet Take 1 tablet (500 mg total) by mouth 4 (four) times daily. 06/06/22   Vanetta Mulders, MD  nitroGLYCERIN (NITROSTAT) 0.4 MG SL tablet PLACE 1 TABLET UNDER TONGUE AS NEEDED FOR CHEST PAIN EVERY 5 MINUTES UP TO 3 DOSES THEN SEEK MEDICAL ATTENTION. Please make overdue appt. 1st attempt 02/18/19   Belva Crome, MD  ondansetron (ZOFRAN-ODT) 8 MG disintegrating tablet Take 1 tablet (8 mg total) by mouth every 8 (eight) hours as needed for nausea or vomiting. 04/08/22  Vanetta Mulders, MD  oxyCODONE (ROXICODONE) 5 MG immediate release tablet Take 1 tablet (5 mg total) by mouth every 4 (four) hours as needed for severe pain or breakthrough pain. 04/07/22   Aundra Dubin, PA-C  pantoprazole (PROTONIX) 40 MG tablet Take 1 tablet (40 mg total) by mouth 2 (two) times daily. 04/20/14   Isaiah Serge, NP  pregabalin (LYRICA) 50 MG capsule Take 1 cap by mouth at night for 7 nights then take 1 cap by mouth in the morning and 1 at night. Patient not taking: Reported on 03/17/2022 02/12/22   Persons, Bevely Palmer, [place]   ranolazine (RANEXA) 500 MG 12 hr tablet TAKE 1 TABLET(500 MG) BY MOUTH TWICE DAILY Patient not taking: Reported on 03/17/2022 02/13/21   Belva Crome, MD  rosuvastatin (CRESTOR) 5 MG tablet Take 1 tablet (5 mg total) by mouth daily at 6 PM. 01/24/17   Eugenie Filler, MD  senna-docusate (SENOKOT-S) 8.6-50 MG tablet Take 1 tablet by mouth at bedtime as needed for mild constipation or moderate constipation. Patient not taking: Reported on 03/17/2022 10/29/20   Margette Fast, MD  traZODone (DESYREL) 50 MG tablet Take 100 mg by mouth at bedtime as needed for sleep.    [provider]      Allergies    Patient has no known allergies.    Review of Systems   Review of Systems  All other systems reviewed and are negative.   Physical Exam Updated Vital Signs BP 109/61   Pulse 64   Temp 97.7 F (36.5 C) (Oral)   Resp 19   Ht '4\' 11"'$  (1.499 m)   Wt 50.8 kg   SpO2 97%   BMI 22.62 kg/m  Physical Exam Vitals and nursing note reviewed.  Constitutional:      General: She is not in acute distress.    Appearance: Normal appearance. She is not ill-appearing, toxic-appearing or diaphoretic.  HENT:     Head: Normocephalic and atraumatic.     Mouth/Throat:     Mouth: Mucous membranes are moist.  Eyes:     Extraocular Movements: Extraocular movements intact.     Conjunctiva/sclera: Conjunctivae normal.     Pupils: Pupils are equal, round, and reactive to light.  Cardiovascular:     Rate and Rhythm: Normal rate and regular rhythm.     Heart sounds: Normal heart sounds. No murmur heard.    No S3 or S4 sounds.  Pulmonary:     Effort: Pulmonary effort is normal.     Breath sounds: Normal breath sounds. No stridor. No decreased breath sounds, wheezing, rhonchi or rales.  Chest:     Chest wall: No mass, deformity, tenderness, crepitus or edema.  Abdominal:     General: Abdomen is flat.     Palpations: Abdomen is soft.     Tenderness: There is no abdominal tenderness. There is no  guarding or rebound.  Musculoskeletal:        General: Normal range of motion.     Cervical back: Normal range of motion and neck supple.     Right lower leg: No tenderness. No edema.     Left lower leg: No tenderness. No edema.  Skin:    General: Skin is warm and dry.     Capillary Refill: Capillary refill takes less than 2 seconds.  Neurological:     Mental Status: She is alert and oriented to person, place, and time.     GCS: GCS eye subscore is  4. GCS verbal subscore is 5. GCS motor subscore is 6.     Cranial Nerves: Cranial nerves 2-12 are intact. No cranial nerve deficit, dysarthria or facial asymmetry.     Sensory: Sensation is intact.     Motor: Motor function is intact. No weakness, tremor, atrophy or abnormal muscle tone.     Coordination: Coordination is intact.  Psychiatric:        Behavior: Behavior normal.     ED Results / Procedures / Treatments   Labs (all labs ordered are listed, but only abnormal results are displayed) Labs Reviewed  BASIC METABOLIC PANEL - Abnormal; Notable for the following components:      Result Value   Glucose, Bld 114 (*)    All other components within normal limits  CBG MONITORING, ED - Abnormal; Notable for the following components:   Glucose-Capillary 119 (*)    All other components within normal limits  RESP PANEL BY RT-PCR (RSV, FLU A&B, COVID)  RVPGX2  CBC WITH DIFFERENTIAL/PLATELET  URINALYSIS, ROUTINE W REFLEX MICROSCOPIC  TROPONIN I (HIGH SENSITIVITY)  TROPONIN I (HIGH SENSITIVITY)    EKG EKG Interpretation  Date/Time:  Sunday October 05 2022 13:48:12 EST Ventricular Rate:  65 PR Interval:  177 QRS Duration: 111 QT Interval:  464 QTC Calculation: 483 R Axis:   -15 Text Interpretation: Sinus rhythm Borderline left axis deviation Abnrm T, consider ischemia, anterolateral lds new since last tracing Confirmed by Dorie Rank [phone]) on 10/05/2022 1:55:03 PM  Radiology CT Head Wo Contrast  Result Date:  10/05/2022 CLINICAL DATA:  Syncopal episode. EXAM: CT HEAD WITHOUT CONTRAST TECHNIQUE: Contiguous axial images were obtained from the base of the skull through the vertex without intravenous contrast. RADIATION DOSE REDUCTION: This exam was performed according to the departmental dose-optimization program which includes automated exposure control, adjustment of the mA and/or kV according to patient size and/or use of iterative reconstruction technique. COMPARISON:  12/21/2016 FINDINGS: Brain: No evidence of intracranial hemorrhage, acute infarction, hydrocephalus, extra-axial collection, or mass lesion/mass effect. Mild diffuse cerebral atrophy appears stable. Vascular:  No hyperdense vessel or other acute findings. Skull: No evidence of fracture or other significant bone abnormality. Sinuses/Orbits:  No acute findings. Other: None. IMPRESSION: No acute intracranial abnormality. Stable mild cerebral atrophy. Electronically Signed   By: Marlaine Hind M.D.   On: 10/05/2022 15:04   DG Chest 2 View  Result Date: 10/05/2022 CLINICAL DATA:  Chest pain EXAM: CHEST - 2 VIEW COMPARISON:  Chest x-rays dated 06/21/2021 and 05/15/2019 FINDINGS: Cardiomegaly, grossly stable. Lungs are clear. No pleural effusion or pneumothorax is seen. No acute-appearing osseous abnormality. IMPRESSION: No active cardiopulmonary disease. No evidence of pneumonia or pulmonary edema. Cardiomegaly. Electronically Signed   By: Franki Cabot M.D.   On: 10/05/2022 14:38    Procedures Procedures    Medications Ordered in ED Medications - No data to display  ED Course/ Medical Decision Making/ A&P                             Medical Decision Making Amount and/or Complexity of Data Reviewed Labs: ordered. Decision-making details documented in ED Course. Radiology: ordered. Decision-making details documented in ED Course. ECG/medicine tests: ordered. Decision-making details documented in ED Course.  Risk Decision regarding  hospitalization.   HEART Score for Major Cardiac Events from [email or web address]  on 10/05/2022 ** All calculations should be rechecked by clinician prior to use **  RESULT SUMMARY: 7 points High Score (  7-10 points)  Risk of MACE of 50-65%.   INPUTS: History --> 2 = Highly suspicious EKG --> 1 = Non-specific repolarization disturbance Age --> 2 = ?65 Risk factors --> 2 = ?3 risk factors or history of atherosclerotic disease Initial troponin --> 0 = ?normal limit     Medical Decision Making:   [name] is a 77 y.o. female who presented to the ED today with syncope/chest pain as detailed above.    Handoff received from EMS.  Additional history discussed with patient's family/caregivers.  Patient's presentation is complicated by their history of CAD.  Patient placed on continuous vitals and telemetry monitoring while in ED which was reviewed periodically.  Complete initial physical exam performed, notably the patient  was alert and oriented with no LE edema, clear lungs to auscultation, regular rate and rhythm, no signs of trauma, stable vital signs, neurologically intact, and with benign physical exam.    Reviewed and confirmed nursing documentation for past medical history, family history, social history.    Initial Assessment:   With the patient's presentation of syncope/chest pain, most likely diagnosis is ACS. Other diagnoses were considered including (but not limited to) anemia, dehydration, viral illness, pneumonia, UTI, electrolyte disturbance, arrhythmia, ICH, CVA/TIA, anxiety, vasovagal syncope. These are considered less likely due to history of present illness and physical exam findings.   This is most consistent with an acute complicated illness  Initial Plan:  Screening labs including CBC and Metabolic panel to evaluate for infectious or metabolic etiology of disease.  Urinalysis with reflex culture ordered to evaluate for UTI or relevant urologic/nephrologic pathology.   CXR/troponin to evaluate for structural/infectious intrathoracic/cardiac pathology.  Viral swabs CT brain to evaluate for ICH/CVA/head trauma/intracranial pathology EKG to evaluate for cardiac pathology Objective evaluation as reviewed   Initial Study Results:   Laboratory  All laboratory results reviewed without evidence of clinically relevant pathology.   EKG EKG was reviewed independently. Rate, rhythm, axis, intervals all examined and without medically relevant abnormality. ST segments with some T wave changes noted to lateral leads but no acute STEMI.  Radiology:  All images reviewed independently. Agree with radiology report at this time.   CT Head Wo Contrast  Result Date: 10/05/2022 CLINICAL DATA:  Syncopal episode. EXAM: CT HEAD WITHOUT CONTRAST TECHNIQUE: Contiguous axial images were obtained from the base of the skull through the vertex without intravenous contrast. RADIATION DOSE REDUCTION: This exam was performed according to the departmental dose-optimization program which includes automated exposure control, adjustment of the mA and/or kV according to patient size and/or use of iterative reconstruction technique. COMPARISON:  12/21/2016 FINDINGS: Brain: No evidence of intracranial hemorrhage, acute infarction, hydrocephalus, extra-axial collection, or mass lesion/mass effect. Mild diffuse cerebral atrophy appears stable. Vascular:  No hyperdense vessel or other acute findings. Skull: No evidence of fracture or other significant bone abnormality. Sinuses/Orbits:  No acute findings. Other: None. IMPRESSION: No acute intracranial abnormality. Stable mild cerebral atrophy. Electronically Signed   By: Marlaine Hind M.D.   On: 10/05/2022 15:04   DG Chest 2 View  Result Date: 10/05/2022 CLINICAL DATA:  Chest pain EXAM: CHEST - 2 VIEW COMPARISON:  Chest x-rays dated 06/21/2021 and 05/15/2019 FINDINGS: Cardiomegaly, grossly stable. Lungs are clear. No pleural effusion or pneumothorax is  seen. No acute-appearing osseous abnormality. IMPRESSION: No active cardiopulmonary disease. No evidence of pneumonia or pulmonary edema. Cardiomegaly. Electronically Signed   By: Franki Cabot M.D.   On: 10/05/2022 14:38      Consults: Case discussed with  hospitalist who accepts admission.   Final Assessment and Plan:   This is a 77 year old female presenting to ED for evaluation of syncope and chest pain. Pt does have cardiac history with previous cardiac stent placement. She denies recent cardiac evaluation. Last echo in system from January 2023. Pt denies recent systemic symptoms or illness and reports she was feeling well and at her baseline until event today. On arrival to ED, she is alert, oriented, neurologically intact, and with benign heart, lung, and abdominal exam in no acute distress. Vital signs stable. With pt's cardiac history, chest pain workup initiated. EKG on initial evaluation shows some acute T wave changes particularly to lateral leads when compared to old EKG but no acute STEMI. First troponin negative, chest x-ray without signs of infection or volume overload. Workup at time of disposition essentially unremarkable apart from EKG. Pt's heart score calculated at 7 with her multiple risk factors and EKG changes. With this, discussed case with attending MD who agrees with plan to admit to hospital for further cardiac evaluation. Hospitalist accepts pt for admission. Pt/husband agreeable with this plan. Pt stable on reassessment/at time of disposition and updated on all findings to date.    Clinical Impression:  1. Syncope and collapse   2. Chest pain, unspecified type      Admit       Final Clinical Impression(s) / ED Diagnoses Final diagnoses:  Syncope and collapse  Chest pain, unspecified type    Rx / DC Orders ED Discharge Orders     None         Suzzette Righter, PA-C 10/05/22 1807    Dorie Rank, MD 10/06/22 [phone]

## 2022-10-05 NOTE — Progress Notes (Signed)
  Echocardiogram 2D Echocardiogram has been performed.  [name] 10/05/2022, 5:53 PM

## 2022-10-05 NOTE — ED Notes (Signed)
ED TO INPATIENT HANDOFF REPORT  ED Nurse Name and Phone #:    Gerald Stabs [phone]  S Name/Age/Gender [name] 77 y.o. female Room/Bed: 023C/023C  Code Status   Code Status: Full Code  Home/SNF/Other Home Patient oriented to: self, place, time, and situation Is this baseline? Yes   Triage Complete: Triage complete  Chief Complaint Chest pain [R07.9]  Triage Note Per EMS, Pt presents after a syncopal episode at church and sharp L chest pain.  Pain score 6/10.  Pt reports "feeling hot" prior to LOC while singing at church.   Denies associated symptoms.   324 Aspirin given en route   Allergies No Known Allergies  Level of Care/Admitting Diagnosis ED Disposition     ED Disposition  Admit   Condition  --   Comment  Hospital Area: [facility] [[id number]]  Level of Care: Telemetry Medical [104]  May place patient in observation at [facility] or [facility] if equivalent level of care is available:: No  Covid Evaluation: Asymptomatic - no recent exposure (last 10 days) testing not required  Diagnosis: Chest pain [id number]  Admitting Physician: Lequita Halt [id number]  Attending Physician: Lequita Halt [id number]          B Medical/Surgery History Past Medical History:  Diagnosis Date   Anemia    Anginal pain ([place])    last cp was last year   Anxiety    Arthritis    Blood transfusion    Chest pain 02/04/2016   Coronary artery disease    Coronary artery spasm, wth continued episodes of chest pain.  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 Sherwood Gambler   breast     right, tumor benign   BREAST EXCISIONAL BIOPSY Right    1961 (age 46) benign   CARDIAC CATHETERIZATION  2014   CARDIAC CATHETERIZATION  2012   CARDIAC CATHETERIZATION N/A 07/22/2016   Procedure: Left Heart Cath and Coronary Angiography;  Surgeon: Belva Crome, MD;  Location: [facility];  Service: Cardiovascular;  Laterality: N/A;   cardiac stent  2012   to RCA   CHOLECYSTECTOMY N/A 10/02/2017   Procedure: LAPAROSCOPIC CHOLECYSTECTOMY;  Surgeon: Ralene Ok, MD;  Location: [facility];  Service: General;  Laterality: N/A;   CORONARY ANGIOPLASTY     ESOPHAGEAL MANOMETRY N/A 10/29/2016   Procedure: ESOPHAGEAL MANOMETRY (EM);  Surgeon: Garlan Fair, MD;  Location: [facility];  Service: Endoscopy;  Laterality: N/A;   ESOPHAGOGASTRODUODENOSCOPY (EGD) WITH PROPOFOL N/A 10/28/2016   Procedure: ESOPHAGOGASTRODUODENOSCOPY (EGD) WITH PROPOFOL;  Surgeon: Garlan Fair, MD;  Location: [facility];  Service: Endoscopy;  Laterality: N/A;   EXCISION OF SKIN TAG N/A 07/29/2017   Procedure: EXCISION OF PERIANAL  SKIN TAG;  Surgeon: Ralene Ok, MD;  Location: [facility];  Service: General;  Laterality: N/A;   KNEE SURGERY  left   left ear surgery     for bad cut   LEFT HEART CATHETERIZATION WITH CORONARY ANGIOGRAM  N/A 02/22/2013   Procedure: LEFT HEART CATHETERIZATION WITH CORONARY ANGIOGRAM;  Surgeon: [name], MD;  Location: [facility];  Service: Cardiovascular;  Laterality: N/A;   REVERSE SHOULDER ARTHROPLASTY Right 04/07/2022   Procedure: RIGHT SHOULDER REVERSE SHOULDER ARTHROPLASTY;  Surgeon: Vanetta Mulders, MD;  Location: [facility];  Service: Orthopedics;  Laterality: Right;   SHOULDER SURGERY Bilateral    rotator cuff   TOE SURGERY Left    toe surgery     A IV Location/Drains/Wounds Patient Lines/Drains/Airways Status     Active Line/Drains/Airways     Name Placement date Placement time Site Days    Peripheral IV 10/05/22 20 G Right Antecubital 10/05/22  1347  Antecubital  less than 1   Incision - 4 Ports Abdomen 1: Umbilicus 2: Right;Lateral 3: Mid;Upper  10/02/17  --  -- 1829            Intake/Output Last 24 hours No intake or output data in the 24 hours ending 10/05/22 1859  Labs/Imaging Results for orders placed or performed during the hospital encounter of 10/05/22 (from the past 48 hour(s))  Basic metabolic panel     Status: Abnormal   Collection Time: 10/05/22  1:32 PM  Result Value Ref Range   Sodium 140 135 - 145 mmol/L   Potassium 4.0 3.5 - 5.1 mmol/L    Comment: HEMOLYSIS AT THIS LEVEL MAY AFFECT RESULT   Chloride 106 98 - 111 mmol/L   CO2 23 22 - 32 mmol/L   Glucose, Bld 114 (H) 70 - 99 mg/dL    Comment: Glucose reference range applies only to samples taken after fasting for at least 8 hours.   BUN 11 8 - 23 mg/dL   Creatinine, Ser 0.68 0.44 - 1.00 mg/dL   Calcium 9.3 8.9 - 10.3 mg/dL   GFR, Estimated >60 >60 mL/min    Comment: (NOTE) Calculated using the CKD-EPI Creatinine Equation (2021)    Anion gap 11 5 - 15    Comment: Performed at [facility] [address] [place], [place] [address]  CBC WITH DIFFERENTIAL     Status: None   Collection Time: 10/05/22  1:32 PM  Result Value Ref Range   WBC 6.8 4.0 - 10.5 K/uL   RBC 4.60 3.87 - 5.11 MIL/uL   Hemoglobin 14.1 12.0 - 15.0 g/dL   HCT 42.3 36.0 - 46.0 %   MCV 92.0 80.0 - 100.0 fL   MCH 30.7 26.0 - 34.0 pg   MCHC 33.3 30.0 - 36.0 g/dL   RDW 14.3 11.5 - 15.5 %   Platelets 256 150 - 400 K/uL   nRBC 0.0 0.0 - 0.2 %   Neutrophils Relative % 69 %   Neutro Abs 4.7 1.7 - 7.7 K/uL   Lymphocytes Relative 24 %   Lymphs Abs 1.6 0.7 - 4.0 K/uL   Monocytes Relative 6 %   Monocytes Absolute 0.4 0.1 - 1.0 K/uL   Eosinophils Relative 1 %   Eosinophils Absolute 0.1 0.0 - 0.5 K/uL   Basophils Relative 0 %   Basophils Absolute 0.0 0.0 - 0.1 K/uL   Immature Granulocytes 0 %   Abs Immature Granulocytes 0.01  0.00 - 0.07 K/uL    Comment: Performed at [facility], [address], [place], [place] [address]  Troponin I (High Sensitivity)     Status: None   Collection Time: 10/05/22  1:44 PM  Result Value Ref Range   Troponin I (High Sensitivity) 9 <  18 ng/L    Comment: (NOTE) Elevated high sensitivity troponin I (hsTnI) values and significant  changes across serial measurements may suggest ACS but many other  chronic and acute conditions are known to elevate hsTnI results.  Refer to the "Links" section for chest pain algorithms and additional  guidance. Performed at [facility], [place] [address], [place], [place] [address]   CBG monitoring, ED     Status: Abnormal   Collection Time: 10/05/22  1:55 PM  Result Value Ref Range   Glucose-Capillary 119 (H) 70 - 99 mg/dL    Comment: Glucose reference range applies only to samples taken after fasting for at least 8 hours.  Resp panel by RT-PCR (RSV, Flu A&B, Covid) Anterior Nasal Swab     Status: None   Collection Time: 10/05/22  1:56 PM   Specimen: Anterior Nasal Swab  Result Value Ref Range   SARS Coronavirus 2 by RT PCR NEGATIVE NEGATIVE   Influenza A by PCR NEGATIVE NEGATIVE   Influenza B by PCR NEGATIVE NEGATIVE    Comment: (NOTE) The Xpert Xpress SARS-CoV-2/FLU/RSV plus assay is intended as an aid in the diagnosis of influenza from Nasopharyngeal swab specimens and should not be used as a sole basis for treatment. Nasal washings and aspirates are unacceptable for Xpert Xpress SARS-CoV-2/FLU/RSV testing.  Fact Sheet for Patients: EntrepreneurPulse.com.au  Fact Sheet for Healthcare Providers: IncredibleEmployment.be  This test is not yet approved or cleared by the [place] FDA and has been authorized for detection and/or diagnosis of SARS-CoV-2 by FDA under an Emergency Use Authorization (EUA). This EUA will remain in effect (meaning this test can be used) for the duration of  the COVID-19 declaration under Section 564(b)(1) of the Act, 21 U.S.C. section 360bbb-3(b)(1), unless the authorization is terminated or revoked.     Resp Syncytial Virus by PCR NEGATIVE NEGATIVE    Comment: (NOTE) Fact Sheet for Patients: EntrepreneurPulse.com.au  Fact Sheet for Healthcare Providers: IncredibleEmployment.be  This test is not yet approved or cleared by the [place] FDA and has been authorized for detection and/or diagnosis of SARS-CoV-2 by FDA under an Emergency Use Authorization (EUA). This EUA will remain in effect (meaning this test can be used) for the duration of the COVID-19 declaration under Section 564(b)(1) of the Act, 21 U.S.C. section 360bbb-3(b)(1), unless the authorization is terminated or revoked.  Performed at [facility], [place] [address] [place], [place] [address]   Troponin I (High Sensitivity)     Status: None   Collection Time: 10/05/22  4:03 PM  Result Value Ref Range   Troponin I (High Sensitivity) 16 <18 ng/L    Comment: (NOTE) Elevated high sensitivity troponin I (hsTnI) values and significant  changes across serial measurements may suggest ACS but many other  chronic and acute conditions are known to elevate hsTnI results.  Refer to the "Links" section for chest pain algorithms and additional  guidance. Performed at [facility], [place] [address] [place], [place] [address]   CBG monitoring, ED     Status: Abnormal   Collection Time: 10/05/22  5:33 PM  Result Value Ref Range   Glucose-Capillary 114 (H) 70 - 99 mg/dL    Comment: Glucose reference range applies only to samples taken after fasting for at least 8 hours.   ECHOCARDIOGRAM COMPLETE  Result Date: 10/05/2022    ECHOCARDIOGRAM REPORT   Patient Name:   [name] Date of Exam: 10/05/2022 Medical Rec #:  [id number]  Height:       59.0 in Accession #:    [id number]      Weight:       112.0 lb Date of Birth:  [birth year]        BSA:          1.442 m Patient Age:    45 years        BP:           109/61 mmHg Patient Gender: F               HR:           65 bpm. Exam Location:  Inpatient Procedure: 2D Echo Indications:    chest pain  History:        Patient has prior history of Echocardiogram examinations, most                 recent 08/29/2021. CAD, Signs/Symptoms:Syncope; Risk                 Factors:Current Smoker and Hypertension.  Sonographer:    Johny Chess RDCS Referring Phys: [id number] Lequita Halt  Sonographer Comments: Global longitudinal strain was attempted. IMPRESSIONS  1. Left ventricular ejection fraction, by estimation, is 45%. The left ventricle has mildly decreased function. The left ventricle demonstrates regional wall motion abnormalities (see scoring diagram/findings for description). Left ventricular diastolic  parameters are consistent with Grade I diastolic dysfunction (impaired relaxation). The average left ventricular global longitudinal strain is -13.0 %. The global longitudinal strain is abnormal.  2. Abnormal inferior and inferolateral regional strain.  3. Right ventricular systolic function is normal. The right ventricular size is normal. There is normal pulmonary artery systolic pressure. The estimated right ventricular systolic pressure is 123XX123 mmHg.  4. The mitral valve is grossly normal. Trivial mitral valve regurgitation.  5. The aortic valve is tricuspid. Aortic valve regurgitation is not visualized. No aortic stenosis is present.  6. The inferior vena cava is normal in size with greater than 50% respiratory variability, suggesting right atrial pressure of 3 mmHg. FINDINGS  Left Ventricle: Left ventricular ejection fraction, by estimation, is 45%. The left ventricle has mildly decreased function. The left ventricle demonstrates regional wall motion abnormalities. The average left ventricular global longitudinal strain is -13.0 %. The global longitudinal strain is abnormal. The left ventricular  internal cavity size was normal in size. There is no left ventricular hypertrophy. Left ventricular diastolic parameters are consistent with Grade I diastolic dysfunction (impaired  relaxation).  LV Wall Scoring: The entire lateral wall is akinetic. Right Ventricle: The right ventricular size is normal. No increase in right ventricular wall thickness. Right ventricular systolic function is normal. There is normal pulmonary artery systolic pressure. The tricuspid regurgitant velocity is 1.87 m/s, and  with an assumed right atrial pressure of 3 mmHg, the estimated right ventricular systolic pressure is 123XX123 mmHg. Left Atrium: Left atrial size was normal in size. Right Atrium: Right atrial size was normal in size. Pericardium: Trivial pericardial effusion is present. The pericardial effusion is posterior to the left ventricle. Mitral Valve: 2D MVA 2.10 cm. The mitral valve is grossly normal. Trivial mitral valve regurgitation. Tricuspid Valve: The tricuspid valve is grossly normal. Tricuspid valve regurgitation is not demonstrated. No evidence of tricuspid stenosis. Aortic Valve: The aortic valve is tricuspid. Aortic valve regurgitation is not visualized. No aortic stenosis is present. Pulmonic Valve: The pulmonic valve was not well visualized. Pulmonic valve regurgitation is not visualized. No evidence of pulmonic stenosis.  Aorta: The aortic root and ascending aorta are structurally normal, with no evidence of dilitation. Venous: The inferior vena cava is normal in size with greater than 50% respiratory variability, suggesting right atrial pressure of 3 mmHg. IAS/Shunts: No atrial level shunt detected by color flow Doppler.  LEFT VENTRICLE PLAX 2D LVIDd:         4.70 cm     Diastology LVIDs:         4.10 cm     LV e' medial:  6.53 cm/s LV PW:         0.90 cm     LV e' lateral: 9.46 cm/s LV IVS:        0.90 cm LVOT diam:     1.70 cm     2D Longitudinal Strain LV SV:         47          2D Strain GLS Avg:     -13.0 % LV  SV Index:   33 LVOT Area:     2.27 cm  LV Volumes (MOD) LV vol d, MOD A4C: 68.0 ml LV vol s, MOD A4C: 44.2 ml LV SV MOD A4C:     68.0 ml RIGHT VENTRICLE             IVC RV Basal diam:  2.30 cm     IVC diam: 1.20 cm RV S prime:     10.80 cm/s TAPSE (M-mode): 1.9 cm LEFT ATRIUM             Index        RIGHT ATRIUM          Index LA diam:        3.80 cm 2.64 cm/m   RA Area:     9.10 cm LA Vol (A2C):   38.6 ml 26.78 ml/m  RA Volume:   17.70 ml 12.28 ml/m LA Vol (A4C):   28.0 ml 19.42 ml/m LA Biplane Vol: 33.6 ml 23.31 ml/m  AORTIC VALVE LVOT Vmax:   102.00 cm/s LVOT Vmean:  63.400 cm/s LVOT VTI:    0.209 m  AORTA Ao Root diam: 2.60 cm Ao Asc diam:  2.50 cm MITRAL VALVE                TRICUSPID VALVE MV Area (PHT): 3.03 cm     TR Peak grad:   14.0 mmHg MV Decel Time: 250 msec     TR Vmax:        187.00 cm/s MV A velocity: 132.00 cm/s                             SHUNTS                             Systemic VTI:  0.21 m                             Systemic Diam: 1.70 cm Rudean Haskell MD Electronically signed by Rudean Haskell MD Signature Date/Time: 10/05/2022/6:19:20 PM    Final    CT Head Wo Contrast  Result Date: 10/05/2022 CLINICAL DATA:  Syncopal episode. EXAM: CT HEAD WITHOUT CONTRAST TECHNIQUE: Contiguous axial images were obtained from the base of the skull through the vertex without intravenous contrast. RADIATION DOSE REDUCTION: This exam was performed according to the departmental dose-optimization program which  includes automated exposure control, adjustment of the mA and/or kV according to patient size and/or use of iterative reconstruction technique. COMPARISON:  12/21/2016 FINDINGS: Brain: No evidence of intracranial hemorrhage, acute infarction, hydrocephalus, extra-axial collection, or mass lesion/mass effect. Mild diffuse cerebral atrophy appears stable. Vascular:  No hyperdense vessel or other acute findings. Skull: No evidence of fracture or other significant bone abnormality.  Sinuses/Orbits:  No acute findings. Other: None. IMPRESSION: No acute intracranial abnormality. Stable mild cerebral atrophy. Electronically Signed   By: Marlaine Hind M.D.   On: 10/05/2022 15:04   DG Chest 2 View  Result Date: 10/05/2022 CLINICAL DATA:  Chest pain EXAM: CHEST - 2 VIEW COMPARISON:  Chest x-rays dated 06/21/2021 and 05/15/2019 FINDINGS: Cardiomegaly, grossly stable. Lungs are clear. No pleural effusion or pneumothorax is seen. No acute-appearing osseous abnormality. IMPRESSION: No active cardiopulmonary disease. No evidence of pneumonia or pulmonary edema. Cardiomegaly. Electronically Signed   By: Franki Cabot M.D.   On: 10/05/2022 14:38    Pending Labs Unresulted Labs (From admission, onward)     Start     Ordered   10/05/22 1701  Magnesium  Add-on,   AD        10/05/22 1700   10/05/22 1658  Hemoglobin A1c  Once,   R       Comments: To assess prior glycemic control    10/05/22 1658   10/05/22 1344  Urinalysis, Routine w reflex microscopic -Urine, Clean Catch  Once,   URGENT       Question:  Specimen Source  Answer:  Urine, Clean Catch   10/05/22 1343            Vitals/Pain Today's Vitals   10/05/22 1331 10/05/22 1400 10/05/22 1748 10/05/22 1750  BP:      Pulse:  64    Resp:  19    Temp:   98.6 F (37 C)   TempSrc:      SpO2:  97%    Weight: 50.8 kg     Height: '4\' 11"'$  (1.499 m)     PainSc: 6    3     Isolation Precautions No active isolations  Medications Medications  aspirin EC tablet 325 mg (has no administration in time range)  HYDROcodone-acetaminophen (NORCO/VICODIN) 5-325 MG per tablet 1 tablet (has no administration in time range)  oxyCODONE (Oxy IR/ROXICODONE) immediate release tablet 5 mg (has no administration in time range)  amLODipine (NORVASC) tablet 2.5 mg (has no administration in time range)  rosuvastatin (CRESTOR) tablet 5 mg (has no administration in time range)  buPROPion (ZYBAN) 12 hr tablet 150 mg (has no administration in time  range)  traZODone (DESYREL) tablet 100 mg (has no administration in time range)  ondansetron (ZOFRAN-ODT) disintegrating tablet 8 mg (has no administration in time range)  pantoprazole (PROTONIX) EC tablet 40 mg (has no administration in time range)  senna-docusate (Senokot-S) tablet 1 tablet (has no administration in time range)  methocarbamol (ROBAXIN) tablet 500 mg (500 mg Oral Given 10/05/22 1746)  insulin aspart (novoLOG) injection 0-15 Units ( Subcutaneous Not Given 10/05/22 1735)  acetaminophen (TYLENOL) tablet 650 mg (has no administration in time range)  ondansetron (ZOFRAN) injection 4 mg (has no administration in time range)  enoxaparin (LOVENOX) injection 40 mg (40 mg Subcutaneous Given 10/05/22 1744)    Mobility walks     Focused Assessments Cardiac Assessment Handoff:  Cardiac Rhythm: Normal sinus rhythm Lab Results  Component Value Date   CKTOTAL 96 09/22/2013   CKMB 4.2 (  H) 09/22/2013   TROPONINI <0.03 01/24/2017   Lab Results  Component Value Date   DDIMER 0.30 05/15/2019   Does the Patient currently have chest pain? No             R Recommendations: See Admitting Provider Note  Report  given to:   Additional  Notes:

## 2022-10-05 NOTE — Consult Note (Signed)
Cardiology Consultation   Patient ID: [name] MRN: [id number]; DOB: 1946/02/15  Admit date: 10/05/2022 Date of Consult: 10/05/2022  PCP:  [name], [place] Prov[facility] Cardiologist:  Therese Sarah  Patient Profile:   [name] is a 77 y.o. female with a hx of CAD who is being seen 10/05/2022 for the evaluation of syncope and chest pain at the request of Dr. Roosevelt Locks.  History of Present Illness:   [name] is a former patient of Dr. Tamala Julian. She has known CAD, s/p remote MI.She also has a h/o coronary spasm and recurrent chest pain.She notes that she has passed out remotely. "Not in a long time." She was in her usual state of health when she was at church and about to start singing, became nauseated and went out. On awakening less than a minute later she had chest pain and was clammy. The chest pain lasted almost 30 min., resolving in the ED. Her initial ECG shows lateral STT changes c/w ischemia. She denies palpitations. She has class 2 CHF symptoms and her EF is 45% by echo in the past.    Past Medical History:  Diagnosis Date   Anemia    Anginal pain ([place])    last cp was last year   Anxiety    Arthritis    Blood transfusion    Chest pain 02/04/2016   Coronary artery disease    Coronary artery spasm, wth continued episodes of chest pain.  09/26/2013   Coronary vasospasm (H[facility])    Diabetes mellitus without complication ([facility])    type II   GERD (gastroesophageal reflux disease)    Headache    Hiatal hernia    Hypercholesteremia    Hypertension    NSTEMI (non-ST elevated myocardial infarction) ([place]) not sure   NSVT (nonsustained ventricular tachycardia) ([place]) 09/26/2013   Panic attack     Past Surgical History:  Procedure Laterality Date   ABDOMINAL HYSTERECTOMY     PARTIAL HYSTERECTOMY   ACDF N/A    C5-6 ACDF Dr. Sherwood Gambler   breast     right, tumor benign   BREAST EXCISIONAL BIOPSY Right    1961 (age 75) benign   CARDIAC  CATHETERIZATION  2014   CARDIAC CATHETERIZATION  2012   CARDIAC CATHETERIZATION N/A 07/22/2016   Procedure: Left Heart Cath and Coronary Angiography;  Surgeon: Belva Crome, MD;  Location: [facility];  Service: Cardiovascular;  Laterality: N/A;   cardiac stent  2012   to RCA   CHOLECYSTECTOMY N/A 10/02/2017   Procedure: LAPAROSCOPIC CHOLECYSTECTOMY;  Surgeon: Ralene Ok, MD;  Location: [facility];  Service: General;  Laterality: N/A;   CORONARY ANGIOPLASTY     ESOPHAGEAL MANOMETRY N/A 10/29/2016   Procedure: ESOPHAGEAL MANOMETRY (EM);  Surgeon: Garlan Fair, MD;  Location: [facility];  Service: Endoscopy;  Laterality: N/A;   ESOPHAGOGASTRODUODENOSCOPY (EGD) WITH PROPOFOL N/A 10/28/2016   Procedure: ESOPHAGOGASTRODUODENOSCOPY (EGD) WITH PROPOFOL;  Surgeon: Garlan Fair, MD;  Location: [facility];  Service: Endoscopy;  Laterality: N/A;   EXCISION OF SKIN TAG N/A 07/29/2017   Procedure: EXCISION OF PERIANAL  SKIN TAG;  Surgeon: Ralene Ok, MD;  Location: [facility];  Service: General;  Laterality: N/A;   KNEE SURGERY  left   left ear surgery     for bad cut   LEFT HEART CATHETERIZATION WITH CORONARY ANGIOGRAM N/A 02/22/2013   Procedure: LEFT HEART CATHETERIZATION WITH CORONARY ANGIOGRAM;  Surgeon: Belva Crome  III, MD;  Location: [facility];  Service: Cardiovascular;  Laterality: N/A;   REVERSE SHOULDER ARTHROPLASTY Right 04/07/2022   Procedure: RIGHT SHOULDER REVERSE SHOULDER ARTHROPLASTY;  Surgeon: Vanetta Mulders, MD;  Location: [facility];  Service: Orthopedics;  Laterality: Right;   SHOULDER SURGERY Bilateral    rotator cuff   TOE SURGERY Left    toe surgery     Home Medications:  Prior to Admission medications   Medication Sig Start Date End Date Taking? Authorizing Provider  ACCU-CHEK GUIDE test strip USE TO CHECK BLOOD SUGAR DAILY 11/05/20   [provider]  Accu-Chek Softclix Lancets lancets daily. 11/05/20   [provider]  amLODipine  (NORVASC) 2.5 MG tablet Take 1 tablet (2.5 mg total) by mouth daily. 01/25/17   Eugenie Filler, MD  aspirin EC 325 MG tablet Take 1 tablet (325 mg total) by mouth daily. 04/07/22   Vanetta Mulders, MD  buPROPion (ZYBAN) 150 MG 12 hr tablet Take 150 mg by mouth every morning. 06/25/20   [provider]  dapagliflozin propanediol (FARXIGA) 10 MG TABS tablet Take 1 tablet (10 mg total) by mouth daily before breakfast. 06/18/22   Belva Crome, MD  HYDROcodone-acetaminophen (NORCO/VICODIN) 5-325 MG tablet Take 1 tablet by mouth every 6 (six) hours as needed for moderate pain. 04/08/22   Vanetta Mulders, MD  HYDROcodone-acetaminophen (NORCO/VICODIN) 5-325 MG tablet Take 1 tablet by mouth every 6 (six) hours as needed for moderate pain. 04/21/22   Vanetta Mulders, MD  metFORMIN (GLUCOPHAGE-XR) 500 MG 24 hr tablet Take 500 mg by mouth 2 (two) times daily. 06/28/20   [provider]  methocarbamol (ROBAXIN) 500 MG tablet Take 1 tablet (500 mg total) by mouth 4 (four) times daily. 04/09/22   Vanetta Mulders, MD  methocarbamol (ROBAXIN) 500 MG tablet Take 1 tablet (500 mg total) by mouth 4 (four) times daily. 06/06/22   Vanetta Mulders, MD  nitroGLYCERIN (NITROSTAT) 0.4 MG SL tablet PLACE 1 TABLET UNDER TONGUE AS NEEDED FOR CHEST PAIN EVERY 5 MINUTES UP TO 3 DOSES THEN SEEK MEDICAL ATTENTION. Please make overdue appt. 1st attempt 02/18/19   Belva Crome, MD  ondansetron (ZOFRAN-ODT) 8 MG disintegrating tablet Take 1 tablet (8 mg total) by mouth every 8 (eight) hours as needed for nausea or vomiting. 04/08/22   Vanetta Mulders, MD  oxyCODONE (ROXICODONE) 5 MG immediate release tablet Take 1 tablet (5 mg total) by mouth every 4 (four) hours as needed for severe pain or breakthrough pain. 04/07/22   Aundra Dubin, PA-C  pantoprazole (PROTONIX) 40 MG tablet Take 1 tablet (40 mg total) by mouth 2 (two) times daily. 04/20/14   Isaiah Serge, NP  pregabalin (LYRICA) 50 MG capsule Take 1 cap by mouth  at night for 7 nights then take 1 cap by mouth in the morning and 1 at night. Patient not taking: Reported on 03/17/2022 02/12/22   Persons, Bevely Palmer, [place]  ranolazine (RANEXA) 500 MG 12 hr tablet TAKE 1 TABLET(500 MG) BY MOUTH TWICE DAILY Patient not taking: Reported on 03/17/2022 02/13/21   Belva Crome, MD  rosuvastatin (CRESTOR) 5 MG tablet Take 1 tablet (5 mg total) by mouth daily at 6 PM. 01/24/17   Eugenie Filler, MD  senna-docusate (SENOKOT-S) 8.6-50 MG tablet Take 1 tablet by mouth at bedtime as needed for mild constipation or moderate constipation. Patient not taking: Reported on 03/17/2022 10/29/20   Margette Fast, MD  traZODone (DESYREL) 50 MG tablet Take 100 mg  by mouth at bedtime as needed for sleep.    [provider]    Inpatient Medications: Scheduled Meds:  [START ON 10/06/2022] amLODipine  2.5 mg Oral Daily   [START ON 10/06/2022] aspirin EC  325 mg Oral Daily   [START ON 10/06/2022] buPROPion  150 mg Oral q morning   enoxaparin (LOVENOX) injection  40 mg Subcutaneous Q24H   insulin aspart  0-15 Units Subcutaneous TID WC   methocarbamol  500 mg Oral QID   pantoprazole  40 mg Oral BID   [START ON 10/06/2022] rosuvastatin  5 mg Oral q1800   Continuous Infusions:  PRN Meds: acetaminophen, HYDROcodone-acetaminophen, ondansetron (ZOFRAN) IV, ondansetron, oxyCODONE, senna-docusate, traZODone  Allergies:   No Known Allergies  Social History:   Social History   Socioeconomic History   Marital status: Married    Spouse name: [name]   Number of children: 0   Years of education: Not on file   Highest education level: Not on file  Occupational History   Not on file  Tobacco Use   Smoking status: Every Day    Packs/day: 0.50    Years: 40.00    Total pack years: 20.00    Types: Cigarettes    Last attempt to quit: 01/10/2016    Years since quitting: 6.7   Smokeless tobacco: Never   Tobacco comments:    Pt reports she smokes a pack or less per week of cigarettes   Vaping Use   Vaping Use: Never used  Substance and Sexual Activity   Alcohol use: Yes    Alcohol/week: 1.0 standard drink of alcohol    Types: 1 Cans of beer per week    Comment: occasional beer   Drug use: No   Sexual activity: Never  Other Topics Concern   Not on file  Social History Narrative   Lives with husband. Ambulates independently.   Social Determinants of Health   Financial Resource Strain: Not on file  Food Insecurity: No Food Insecurity (03/24/2022)   Hunger Vital Sign    Worried About Running Out of Food in the Last Year: Never true    Ran Out of Food in the Last Year: Never true  Transportation Needs: No Transportation Needs (03/24/2022)   PRAPARE - Hydrologist (Medical): No    Lack of Transportation (Non-Medical): No  Physical Activity: Not on file  Stress: Not on file  Social Connections: Not on file  Intimate Partner Violence: Not on file    Family History:    Family History  Problem Relation Age of Onset   Heart failure Mother    Stroke Sister    Heart attack Other      ROS:  Please see the history of present illness.   All other ROS reviewed and negative.     Physical Exam/Data:   Vitals:   10/05/22 1330 10/05/22 1331 10/05/22 1400  BP: 109/61    Pulse: 69  64  Resp: 14  19  Temp: 97.7 F (36.5 C)    TempSrc: Oral    SpO2: 98%  97%  Weight:  50.8 kg   Height:  '4\' 11"'$  (1.499 m)    No intake or output data in the 24 hours ending 10/05/22 1719    10/05/2022    1:31 PM 04/07/2022    9:56 AM 03/31/2022    8:34 AM  Last 3 Weights  Weight (lbs) 112 lb 112 lb 110 lb 14.4 oz  Weight (kg) 50.803  kg 50.803 kg 50.304 kg     Body mass index is 22.62 kg/m.  General:  Well nourished, well developed, in no acute distress HEENT: normal Neck: no JVD Vascular: No carotid bruits; Distal pulses 2+ bilaterally Cardiac:  normal S1, S2; RRR; no murmur  Lungs:  clear to auscultation bilaterally, no wheezing, rhonchi or  rales  Abd: soft, nontender, no hepatomegaly  Ext: no edema Musculoskeletal:  No deformities, BUE and BLE strength normal and equal Skin: warm and dry  Neuro:  CNs 2-12 intact, no focal abnormalities noted Psych:  Normal affect   EKG:  The EKG was personally reviewed and demonstrates:  NSR with lateral STT changes Telemetry:  Telemetry was personally reviewed and demonstrates:  NSR  Relevant CV Studies: 2D echo is pending  Laboratory Data:  High Sensitivity Troponin:   Recent Labs  Lab 10/05/22 1344  TROPONINIHS 9     Chemistry Recent Labs  Lab 10/05/22 1332  NA 140  K 4.0  CL 106  CO2 23  GLUCOSE 114*  BUN 11  CREATININE 0.68  CALCIUM 9.3  GFRNONAA >60  ANIONGAP 11    No results for input(s): "PROT", "ALBUMIN", "AST", "ALT", "ALKPHOS", "BILITOT" in the last 168 hours. Lipids No results for input(s): "CHOL", "TRIG", "HDL", "LABVLDL", "LDLCALC", "CHOLHDL" in the last 168 hours.  Hematology Recent Labs  Lab 10/05/22 1332  WBC 6.8  RBC 4.60  HGB 14.1  HCT 42.3  MCV 92.0  MCH 30.7  MCHC 33.3  RDW 14.3  PLT 256   Thyroid No results for input(s): "TSH", "FREET4" in the last 168 hours.  BNPNo results for input(s): "BNP", "PROBNP" in the last 168 hours.  DDimer No results for input(s): "DDIMER" in the last 168 hours.   Radiology/Studies:  CT Head Wo Contrast  Result Date: 10/05/2022 CLINICAL DATA:  Syncopal episode. EXAM: CT HEAD WITHOUT CONTRAST TECHNIQUE: Contiguous axial images were obtained from the base of the skull through the vertex without intravenous contrast. RADIATION DOSE REDUCTION: This exam was performed according to the departmental dose-optimization program which includes automated exposure control, adjustment of the mA and/or kV according to patient size and/or use of iterative reconstruction technique. COMPARISON:  12/21/2016 FINDINGS: Brain: No evidence of intracranial hemorrhage, acute infarction, hydrocephalus, extra-axial collection, or mass  lesion/mass effect. Mild diffuse cerebral atrophy appears stable. Vascular:  No hyperdense vessel or other acute findings. Skull: No evidence of fracture or other significant bone abnormality. Sinuses/Orbits:  No acute findings. Other: None. IMPRESSION: No acute intracranial abnormality. Stable mild cerebral atrophy. Electronically Signed   By: Marlaine Hind M.D.   On: 10/05/2022 15:04   DG Chest 2 View  Result Date: 10/05/2022 CLINICAL DATA:  Chest pain EXAM: CHEST - 2 VIEW COMPARISON:  Chest x-rays dated 06/21/2021 and 05/15/2019 FINDINGS: Cardiomegaly, grossly stable. Lungs are clear. No pleural effusion or pneumothorax is seen. No acute-appearing osseous abnormality. IMPRESSION: No active cardiopulmonary disease. No evidence of pneumonia or pulmonary edema. Cardiomegaly. Electronically Signed   By: Franki Cabot M.D.   On: 10/05/2022 14:38     Assessment and Plan:   Syncope - the etiology is unclear. Most likely this was vagally mediated. She did not eat breakfast and was standing in church about to sing. I would recommend she be admitted for observation. If no arrhythymias, an outpatient zio monitor would be reasonable. Chest pain - she has ECG changes which are concerning. If the troponins are elevated, about 500, I would recommend left heart cath. It could all  be coronary spasm. However, she has known disease. If enzymes are not elevated, then I would consider outpatient PET. Continue calcium blocker and ranolazine for now. 3. Chronic systolic heart failure - her symptoms are class 2 at baseline. If her EF is down further, I would have a low threshold for cath. 4. Tobacco abuse - smoking cessation is encouraged.   For questions or updates, please contact [facility] Please consult [email or web address] for contact info under   Signed, Cristopher Peru, MD  10/05/2022 5:19 PM

## 2022-10-06 ENCOUNTER — Other Ambulatory Visit: Payer: Self-pay | Admitting: Physician Assistant

## 2022-10-06 ENCOUNTER — Observation Stay (INDEPENDENT_AMBULATORY_CARE_PROVIDER_SITE_OTHER): Payer: Medicare Other

## 2022-10-06 ENCOUNTER — Observation Stay (HOSPITAL_BASED_OUTPATIENT_CLINIC_OR_DEPARTMENT_OTHER): Payer: Medicare Other

## 2022-10-06 ENCOUNTER — Encounter (HOSPITAL_COMMUNITY)
Admission: EM | Disposition: A | Discharge status: Home / Self Care | Payer: Self-pay | Source: Home / Self Care | Attending: Obstetrics and Gynecology

## 2022-10-06 DIAGNOSIS — I2510 Atherosclerotic heart disease of native coronary artery without angina pectoris: Secondary | ICD-10-CM

## 2022-10-06 DIAGNOSIS — Z95820 Peripheral vascular angioplasty status with implants and grafts: Secondary | ICD-10-CM

## 2022-10-06 DIAGNOSIS — R55 Syncope and collapse: Secondary | ICD-10-CM

## 2022-10-06 DIAGNOSIS — I25118 Atherosclerotic heart disease of native coronary artery with other forms of angina pectoris: Secondary | ICD-10-CM | POA: Diagnosis not present

## 2022-10-06 DIAGNOSIS — R072 Precordial pain: Secondary | ICD-10-CM | POA: Diagnosis not present

## 2022-10-06 HISTORY — PX: LEFT HEART CATH AND CORONARY ANGIOGRAPHY: CATH118249

## 2022-10-06 LAB — GLUCOSE, CAPILLARY
Glucose-Capillary: 102 mg/dL — ABNORMAL HIGH (ref 70–99)
Glucose-Capillary: 106 mg/dL — ABNORMAL HIGH (ref 70–99)
Glucose-Capillary: 135 mg/dL — ABNORMAL HIGH (ref 70–99)
Glucose-Capillary: 138 mg/dL — ABNORMAL HIGH (ref 70–99)
Glucose-Capillary: 96 mg/dL (ref 70–99)

## 2022-10-06 LAB — POCT ACTIVATED CLOTTING TIME
Activated Clotting Time: 217 seconds
Activated Clotting Time: 168 seconds

## 2022-10-06 LAB — TROPONIN I (HIGH SENSITIVITY): Troponin I (High Sensitivity): 10 ng/L (ref <18)

## 2022-10-06 SURGERY — LEFT HEART CATH AND CORONARY ANGIOGRAPHY
Anesthesia: LOCAL

## 2022-10-06 MED ORDER — VERAPAMIL HCL 2.5 MG/ML IV SOLN
INTRAVENOUS | Status: AC
  Filled 2022-10-06: qty 2

## 2022-10-06 MED ORDER — FENTANYL CITRATE (PF) 100 MCG/2ML IJ SOLN
INTRAMUSCULAR | Status: DC | PRN
  Administered 2022-10-06: 25 ug via INTRAVENOUS

## 2022-10-06 MED ORDER — SODIUM CHLORIDE 0.9% FLUSH
3.0000 mL | Freq: Two times a day (BID) | INTRAVENOUS | Status: DC
  Administered 2022-10-06 – 2022-10-07 (×2): 3 mL via INTRAVENOUS

## 2022-10-06 MED ORDER — HEPARIN SODIUM (PORCINE) 1000 UNIT/ML IJ SOLN
INTRAMUSCULAR | Status: DC | PRN
  Administered 2022-10-06: 3000 [IU] via INTRAVENOUS

## 2022-10-06 MED ORDER — MIDAZOLAM HCL 2 MG/2ML IJ SOLN
INTRAMUSCULAR | Status: DC | PRN
  Administered 2022-10-06: 1 mg via INTRAVENOUS

## 2022-10-06 MED ORDER — NITROGLYCERIN 1 MG/10 ML FOR IR/CATH LAB
INTRA_ARTERIAL | Status: AC
  Filled 2022-10-06: qty 10

## 2022-10-06 MED ORDER — ASPIRIN 81 MG PO CHEW
81.0000 mg | CHEWABLE_TABLET | Freq: Every day | ORAL | Status: DC
  Administered 2022-10-07: 81 mg via ORAL
  Filled 2022-10-06: qty 1

## 2022-10-06 MED ORDER — LIDOCAINE HCL (PF) 1 % IJ SOLN
INTRAMUSCULAR | Status: AC
  Filled 2022-10-06: qty 30

## 2022-10-06 MED ORDER — IOHEXOL 350 MG/ML SOLN
INTRAVENOUS | Status: DC | PRN
  Administered 2022-10-06: 65 mL

## 2022-10-06 MED ORDER — MIDAZOLAM HCL 2 MG/2ML IJ SOLN
INTRAMUSCULAR | Status: AC
  Filled 2022-10-06: qty 2

## 2022-10-06 MED ORDER — ACETAMINOPHEN 325 MG PO TABS
650.0000 mg | ORAL_TABLET | ORAL | Status: DC | PRN
  Administered 2022-10-07: 650 mg via ORAL
  Filled 2022-10-06: qty 2

## 2022-10-06 MED ORDER — HEPARIN (PORCINE) IN NACL 1000-0.9 UT/500ML-% IV SOLN
INTRAVENOUS | Status: DC | PRN
  Administered 2022-10-06 (×2): 500 mL

## 2022-10-06 MED ORDER — ASPIRIN 81 MG PO CHEW: 81.0000 mg | CHEWABLE_TABLET | ORAL | Status: DC

## 2022-10-06 MED ORDER — SODIUM CHLORIDE 0.9% FLUSH: 3.0000 mL | INTRAVENOUS | Status: DC | PRN

## 2022-10-06 MED ORDER — ISOSORBIDE MONONITRATE ER 30 MG PO TB24
15.0000 mg | ORAL_TABLET | Freq: Every day | ORAL | Status: DC
  Administered 2022-10-06 – 2022-10-07 (×2): 15 mg via ORAL
  Filled 2022-10-06 (×2): qty 1

## 2022-10-06 MED ORDER — SODIUM CHLORIDE 0.9 % WEIGHT BASED INFUSION: 1.0000 mL/kg/h | INTRAVENOUS | Status: DC

## 2022-10-06 MED ORDER — LABETALOL HCL 5 MG/ML IV SOLN: 10.0000 mg | INTRAVENOUS | Status: AC | PRN

## 2022-10-06 MED ORDER — HYDRALAZINE HCL 20 MG/ML IJ SOLN: 10.0000 mg | INTRAMUSCULAR | Status: AC | PRN

## 2022-10-06 MED ORDER — ONDANSETRON HCL 4 MG/2ML IJ SOLN: 4.0000 mg | Freq: Four times a day (QID) | INTRAMUSCULAR | Status: DC | PRN

## 2022-10-06 MED ORDER — HEPARIN SODIUM (PORCINE) 1000 UNIT/ML IJ SOLN
INTRAMUSCULAR | Status: AC
  Filled 2022-10-06: qty 10

## 2022-10-06 MED ORDER — VERAPAMIL HCL 2.5 MG/ML IV SOLN
INTRA_ARTERIAL | Status: DC | PRN
  Administered 2022-10-06: 15 mL via INTRA_ARTERIAL

## 2022-10-06 MED ORDER — MORPHINE SULFATE (PF) 2 MG/ML IV SOLN: 2.0000 mg | INTRAVENOUS | Status: DC | PRN

## 2022-10-06 MED ORDER — SODIUM CHLORIDE 0.9 % WEIGHT BASED INFUSION
3.0000 mL/kg/h | INTRAVENOUS | Status: DC
  Administered 2022-10-06: 3 mL/kg/h via INTRAVENOUS

## 2022-10-06 MED ORDER — SODIUM CHLORIDE 0.9 % IV SOLN: 250.0000 mL | INTRAVENOUS | Status: DC | PRN

## 2022-10-06 MED ORDER — LIDOCAINE HCL (PF) 1 % IJ SOLN
INTRAMUSCULAR | Status: DC | PRN
  Administered 2022-10-06: 10 mL via INTRADERMAL
  Administered 2022-10-06: 2 mL via INTRADERMAL

## 2022-10-06 MED ORDER — FENTANYL CITRATE (PF) 100 MCG/2ML IJ SOLN
INTRAMUSCULAR | Status: AC
  Filled 2022-10-06: qty 2

## 2022-10-06 MED ORDER — SODIUM CHLORIDE 0.9 % IV SOLN: INTRAVENOUS | Status: AC

## 2022-10-06 SURGICAL SUPPLY — 14 items
BAND CMPR LRG ZPHR (HEMOSTASIS) ×1
BAND ZEPHYR COMPRESS 30 LONG (HEMOSTASIS) IMPLANT
CATH INFINITI 5FR MULTPACK ANG (CATHETERS) IMPLANT
CATH OPTITORQUE TIG 4.0 5F (CATHETERS) IMPLANT
GLIDESHEATH SLEND A-KIT 6F 22G (SHEATH) IMPLANT
KIT HEART LEFT (KITS) ×1 IMPLANT
PACK CARDIAC CATHETERIZATION (CUSTOM PROCEDURE TRAY) ×1 IMPLANT
SHEATH PINNACLE 5F 10CM (SHEATH) IMPLANT
SHEATH PROBE COVER 6X72 (BAG) IMPLANT
SYR MEDRAD MARK 7 150ML (SYRINGE) ×1 IMPLANT
TRANSDUCER W/STOPCOCK (MISCELLANEOUS) ×1 IMPLANT
TUBING CIL FLEX 10 FLL-RA (TUBING) ×1 IMPLANT
WIRE EMERALD 3MM-J .035X150CM (WIRE) IMPLANT
WIRE HI TORQ VERSACORE-J 145CM (WIRE) IMPLANT

## 2022-10-06 NOTE — Interval H&P Note (Signed)
Cath Lab Visit (complete for each Cath Lab visit)  Clinical Evaluation Leading to the Procedure:   ACS: No.  Non-ACS:    Anginal Classification: CCS II  Anti-ischemic medical therapy: Minimal Therapy (1 class of medications)  Non-Invasive Test Results: No non-invasive testing performed  Prior CABG: No previous CABG      History and Physical Interval Note:  10/06/2022 12:03 PM  [name]  has presented today for surgery, with the diagnosis of unstable angina.  The various methods of treatment have been discussed with the patient and family. After consideration of risks, benefits and other options for treatment, the patient has consented to  Procedure(s): LEFT HEART CATH AND CORONARY ANGIOGRAPHY (N/A) as a surgical intervention.  The patient's history has been reviewed, patient examined, no change in status, stable for surgery.  I have reviewed the patient's chart and labs.  Questions were answered to the patient's satisfaction.     Quay Burow

## 2022-10-06 NOTE — H&P (View-Only) (Signed)
Rounding Note    Patient Name: [name] Date of Encounter: 10/06/2022  [place] Cardiologist: Freada Bergeron, MD   Subjective   Pt reports chest pain worse with deep inspiration. Repeat HST pending.  Inpatient Medications    Scheduled Meds:  amLODipine  2.5 mg Oral Daily   aspirin EC  325 mg Oral Daily   enoxaparin (LOVENOX) injection  40 mg Subcutaneous Q24H   insulin aspart  0-15 Units Subcutaneous TID WC   methocarbamol  500 mg Oral QID   pantoprazole  40 mg Oral BID   rosuvastatin  5 mg Oral q1800   Continuous Infusions:  PRN Meds: acetaminophen, HYDROcodone-acetaminophen, ondansetron (ZOFRAN) IV, ondansetron, oxyCODONE, senna-docusate, traZODone   Vital Signs    Vitals:   10/05/22 2025 10/05/22 2032 10/06/22 0036 10/06/22 0554  BP:   (!) 106/52 (!) 103/54  Pulse: 62  62 69  Resp: '16  20 20  '$ Temp: 97.7 F (36.5 C)  (!) 97.5 F (36.4 C)   TempSrc: Oral  Oral Oral  SpO2:   97% 96%  Weight:  53.8 kg    Height:  '4\' 11"'$  (1.499 m)     No intake or output data in the 24 hours ending 10/06/22 1004    10/05/2022    8:32 PM 10/05/2022    1:31 PM 04/07/2022    9:56 AM  Last 3 Weights  Weight (lbs) 118 lb 11.2 oz 112 lb 112 lb  Weight (kg) 53.842 kg 50.803 kg 50.803 kg      Telemetry    Sinus rhythm with HR 70 - Personally Reviewed  ECG    No new tracings - Personally Reviewed  Physical Exam   GEN: No acute distress.   Neck: No JVD Cardiac: RRR, no murmurs, point tenderness with palpation Respiratory: Clear to auscultation bilaterally. GI: Soft, nontender, non-distended  MS: No edema; No deformity. Neuro:  Nonfocal  Psych: Normal affect   Labs    High Sensitivity Troponin:   Recent Labs  Lab 10/05/22 1344 10/05/22 1603  TROPONINIHS 9 16     Chemistry Recent Labs  Lab 10/05/22 1332 10/05/22 1603  NA 140  --   K 4.0  --   CL 106  --   CO2 23  --   GLUCOSE 114*  --   BUN 11  --   CREATININE 0.68  --   CALCIUM  9.3  --   MG  --  1.9  GFRNONAA >60  --   ANIONGAP 11  --     Lipids No results for input(s): "CHOL", "TRIG", "HDL", "LABVLDL", "LDLCALC", "CHOLHDL" in the last 168 hours.  Hematology Recent Labs  Lab 10/05/22 1332  WBC 6.8  RBC 4.60  HGB 14.1  HCT 42.3  MCV 92.0  MCH 30.7  MCHC 33.3  RDW 14.3  PLT 256   Thyroid No results for input(s): "TSH", "FREET4" in the last 168 hours.  BNPNo results for input(s): "BNP", "PROBNP" in the last 168 hours.  DDimer No results for input(s): "DDIMER" in the last 168 hours.   Radiology    ECHOCARDIOGRAM COMPLETE  Result Date: 10/05/2022    ECHOCARDIOGRAM REPORT   Patient Name:   [name] Date of Exam: 10/05/2022 Medical Rec #:  [id number]       Height:       59.0 in Accession #:    [id number]      Weight:       112.0 lb Date of Birth:  10/16/1945       BSA:          1.442 m Patient Age:    77 years        BP:           109/61 mmHg Patient Gender: F               HR:           65 bpm. Exam Location:  Inpatient Procedure: 2D Echo Indications:    chest pain  History:        Patient has prior history of Echocardiogram examinations, most                 recent 08/29/2021. CAD, Signs/Symptoms:Syncope; Risk                 Factors:Current Smoker and Hypertension.  Sonographer:    Johny Chess RDCS Referring Phys: [id number] Lequita Halt  Sonographer Comments: Global longitudinal strain was attempted. IMPRESSIONS  1. Left ventricular ejection fraction, by estimation, is 45%. The left ventricle has mildly decreased function. The left ventricle demonstrates regional wall motion abnormalities (see scoring diagram/findings for description). Left ventricular diastolic  parameters are consistent with Grade I diastolic dysfunction (impaired relaxation). The average left ventricular global longitudinal strain is -13.0 %. The global longitudinal strain is abnormal.  2. Abnormal inferior and inferolateral regional strain.  3. Right ventricular systolic function is  normal. The right ventricular size is normal. There is normal pulmonary artery systolic pressure. The estimated right ventricular systolic pressure is 123XX123 mmHg.  4. The mitral valve is grossly normal. Trivial mitral valve regurgitation.  5. The aortic valve is tricuspid. Aortic valve regurgitation is not visualized. No aortic stenosis is present.  6. The inferior vena cava is normal in size with greater than 50% respiratory variability, suggesting right atrial pressure of 3 mmHg. FINDINGS  Left Ventricle: Left ventricular ejection fraction, by estimation, is 45%. The left ventricle has mildly decreased function. The left ventricle demonstrates regional wall motion abnormalities. The average left ventricular global longitudinal strain is -13.0 %. The global longitudinal strain is abnormal. The left ventricular internal cavity size was normal in size. There is no left ventricular hypertrophy. Left ventricular diastolic parameters are consistent with Grade I diastolic dysfunction (impaired  relaxation).  LV Wall Scoring: The entire lateral wall is akinetic. Right Ventricle: The right ventricular size is normal. No increase in right ventricular wall thickness. Right ventricular systolic function is normal. There is normal pulmonary artery systolic pressure. The tricuspid regurgitant velocity is 1.87 m/s, and  with an assumed right atrial pressure of 3 mmHg, the estimated right ventricular systolic pressure is 123XX123 mmHg. Left Atrium: Left atrial size was normal in size. Right Atrium: Right atrial size was normal in size. Pericardium: Trivial pericardial effusion is present. The pericardial effusion is posterior to the left ventricle. Mitral Valve: 2D MVA 2.10 cm. The mitral valve is grossly normal. Trivial mitral valve regurgitation. Tricuspid Valve: The tricuspid valve is grossly normal. Tricuspid valve regurgitation is not demonstrated. No evidence of tricuspid stenosis. Aortic Valve: The aortic valve is tricuspid.  Aortic valve regurgitation is not visualized. No aortic stenosis is present. Pulmonic Valve: The pulmonic valve was not well visualized. Pulmonic valve regurgitation is not visualized. No evidence of pulmonic stenosis. Aorta: The aortic root and ascending aorta are structurally normal, with no evidence of dilitation. Venous: The inferior vena cava is normal in size with greater than 50% respiratory variability, suggesting right atrial  pressure of 3 mmHg. IAS/Shunts: No atrial level shunt detected by color flow Doppler.  LEFT VENTRICLE PLAX 2D LVIDd:         4.70 cm     Diastology LVIDs:         4.10 cm     LV e' medial:  6.53 cm/s LV PW:         0.90 cm     LV e' lateral: 9.46 cm/s LV IVS:        0.90 cm LVOT diam:     1.70 cm     2D Longitudinal Strain LV SV:         47          2D Strain GLS Avg:     -13.0 % LV SV Index:   33 LVOT Area:     2.27 cm  LV Volumes (MOD) LV vol d, MOD A4C: 68.0 ml LV vol s, MOD A4C: 44.2 ml LV SV MOD A4C:     68.0 ml RIGHT VENTRICLE             IVC RV Basal diam:  2.30 cm     IVC diam: 1.20 cm RV S prime:     10.80 cm/s TAPSE (M-mode): 1.9 cm LEFT ATRIUM             Index        RIGHT ATRIUM          Index LA diam:        3.80 cm 2.64 cm/m   RA Area:     9.10 cm LA Vol (A2C):   38.6 ml 26.78 ml/m  RA Volume:   17.70 ml 12.28 ml/m LA Vol (A4C):   28.0 ml 19.42 ml/m LA Biplane Vol: 33.6 ml 23.31 ml/m  AORTIC VALVE LVOT Vmax:   102.00 cm/s LVOT Vmean:  63.400 cm/s LVOT VTI:    0.209 m  AORTA Ao Root diam: 2.60 cm Ao Asc diam:  2.50 cm MITRAL VALVE                TRICUSPID VALVE MV Area (PHT): 3.03 cm     TR Peak grad:   14.0 mmHg MV Decel Time: 250 msec     TR Vmax:        187.00 cm/s MV A velocity: 132.00 cm/s                             SHUNTS                             Systemic VTI:  0.21 m                             Systemic Diam: 1.70 cm Rudean Haskell MD Electronically signed by Rudean Haskell MD Signature Date/Time: 10/05/2022/6:19:20 PM    Final    CT Head  Wo Contrast  Result Date: 10/05/2022 CLINICAL DATA:  Syncopal episode. EXAM: CT HEAD WITHOUT CONTRAST TECHNIQUE: Contiguous axial images were obtained from the base of the skull through the vertex without intravenous contrast. RADIATION DOSE REDUCTION: This exam was performed according to the departmental dose-optimization program which includes automated exposure control, adjustment of the mA and/or kV according to patient size and/or use of iterative reconstruction technique. COMPARISON:  12/21/2016 FINDINGS: Brain: No evidence of intracranial hemorrhage, acute infarction, hydrocephalus,  extra-axial collection, or mass lesion/mass effect. Mild diffuse cerebral atrophy appears stable. Vascular:  No hyperdense vessel or other acute findings. Skull: No evidence of fracture or other significant bone abnormality. Sinuses/Orbits:  No acute findings. Other: None. IMPRESSION: No acute intracranial abnormality. Stable mild cerebral atrophy. Electronically Signed   By: Marlaine Hind M.D.   On: 10/05/2022 15:04   DG Chest 2 View  Result Date: 10/05/2022 CLINICAL DATA:  Chest pain EXAM: CHEST - 2 VIEW COMPARISON:  Chest x-rays dated 06/21/2021 and 05/15/2019 FINDINGS: Cardiomegaly, grossly stable. Lungs are clear. No pleural effusion or pneumothorax is seen. No acute-appearing osseous abnormality. IMPRESSION: No active cardiopulmonary disease. No evidence of pneumonia or pulmonary edema. Cardiomegaly. Electronically Signed   By: Franki Cabot M.D.   On: 10/05/2022 14:38    Cardiac Studies   Echo 10/05/22:  1. Left ventricular ejection fraction, by estimation, is 45%. The left  ventricle has mildly decreased function. The left ventricle demonstrates  regional wall motion abnormalities (see scoring diagram/findings for  description). Left ventricular diastolic   parameters are consistent with Grade I diastolic dysfunction (impaired  relaxation). The average left ventricular global longitudinal strain is  -13.0 %.  The global longitudinal strain is abnormal.   2. Abnormal inferior and inferolateral regional strain.   3. Right ventricular systolic function is normal. The right ventricular  size is normal. There is normal pulmonary artery systolic pressure. The  estimated right ventricular systolic pressure is 123XX123 mmHg.   4. The mitral valve is grossly normal. Trivial mitral valve  regurgitation.   5. The aortic valve is tricuspid. Aortic valve regurgitation is not  visualized. No aortic stenosis is present.   6. The inferior vena cava is normal in size with greater than 50%  respiratory variability, suggesting right atrial pressure of 3 mmHg.    Patient Profile     77 y.o. female with a hx of CAD and known coronary spasm with recurrent chest pain and prior syncope who is being seen for CP and syncope.    Assessment & Plan    Chest pain CAD Has atypical features but given TWI on ECG and known RCA PCI, will plan for cath today  Mild cardiomyopathy Known LVEF 45% GDMT difficult given BP Does not appear volume up on exam, but she reports chronic orthopnea   Syncope Unclear etiology, but sounds consistent with a a vasovagal event - became hot and nauseous just before LOC Reports this has happened before, but has been "years" She did have a prodrome, while standing and singing with church choir - consider heart monitor at discharge - telemetry unrevealing - she has been instructed not to drive for 6 months     If third troponin negative and EKG stable, no further ischemic workup this admission.  - increase amlodipine to 2.5 mg BID - consider ranexa OP - 14 day zio for syncope - I will arrange cardiology follow up - 325 mg ASA listed on home medications - suspect this may have been left over from her shoulder surgery last year. Reduce back to 81 mg ASA   For questions or updates, please contact [place] Please consult [email or web address] for contact info under         Signed, Ledora Bottcher, PA  10/06/2022, 10:04 AM    Patient seen and examined and agree with Fabian Sharp, PA as detailed above.  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  Patient presented after having an episode of syncope. Specifically, she was standing in church about to sing when she felt very unwell and then passed out. Had missed breakfast that morning. When she woke up, she was complaining of sharp chest pain. In the ER, trop negative. ECG  with NSR with anterolateral TWI that appear worse than prior. Repeat TTE with stable LVEF 45% with lateral wall akinesis (chronic). Notably, coronary CTA in 08/2021 with mild disease in LAD and Lcx. Has known RCA stent but no obvious stenosis visualized (although exam limited).  Overall, chest pain is atypical with more pleuritic features, however, anterolateral TWI are concerning. Fortunately, Trop has remained negative. While this may be related to coronary vasospasm which has been noted in the past, given known CAD with prior RCA stent and TWI in anterolateral leads that appear worse than prior, will plan for cath today.   GEN: No acute distress.   Neck: No JVD Cardiac: Bradycardic, regular, no murmurs.  Respiratory: Clear to auscultation bilaterally. GI: Soft, nontender, non-distended  MS: No edema; No deformity. Neuro:  Nonfocal  Psych: Normal affect    Plan: -Plan for LHC today -Continue amlodipine 2.'5mg'$  daily -Start imdur '15mg'$  daily -Continue ASA, crestor '5mg'$  daily -Will plan for 2 week zio monitor on discharge for work-up of syncope if tele/above work-up unrevealing   Gwyndolyn Kaufman, MD

## 2022-10-06 NOTE — Progress Notes (Addendum)
Rounding Note    Patient Name: Genevea Sieloff Date of Encounter: 10/06/2022  [place] Cardiologist: Freada Bergeron, MD   Subjective   Pt reports chest pain worse with deep inspiration. Repeat HST pending.  Inpatient Medications    Scheduled Meds:  amLODipine  2.5 mg Oral Daily   aspirin EC  325 mg Oral Daily   enoxaparin (LOVENOX) injection  40 mg Subcutaneous Q24H   insulin aspart  0-15 Units Subcutaneous TID WC   methocarbamol  500 mg Oral QID   pantoprazole  40 mg Oral BID   rosuvastatin  5 mg Oral q1800   Continuous Infusions:  PRN Meds: acetaminophen, HYDROcodone-acetaminophen, ondansetron (ZOFRAN) IV, ondansetron, oxyCODONE, senna-docusate, traZODone   Vital Signs    Vitals:   10/05/22 2025 10/05/22 2032 10/06/22 0036 10/06/22 0554  BP:   (!) 106/52 (!) 103/54  Pulse: 62  62 69  Resp: '16  20 20  '$ Temp: 97.7 F (36.5 C)  (!) 97.5 F (36.4 C)   TempSrc: Oral  Oral Oral  SpO2:   97% 96%  Weight:  53.8 kg    Height:  '4\' 11"'$  (1.499 m)     No intake or output data in the 24 hours ending 10/06/22 1004    10/05/2022    8:32 PM 10/05/2022    1:31 PM 04/07/2022    9:56 AM  Last 3 Weights  Weight (lbs) 118 lb 11.2 oz 112 lb 112 lb  Weight (kg) 53.842 kg 50.803 kg 50.803 kg      Telemetry    Sinus rhythm with HR 70 - Personally Reviewed  ECG    No new tracings - Personally Reviewed  Physical Exam   GEN: No acute distress.   Neck: No JVD Cardiac: RRR, no murmurs, point tenderness with palpation Respiratory: Clear to auscultation bilaterally. GI: Soft, nontender, non-distended  MS: No edema; No deformity. Neuro:  Nonfocal  Psych: Normal affect   Labs    High Sensitivity Troponin:   Recent Labs  Lab 10/05/22 1344 10/05/22 1603  TROPONINIHS 9 16     Chemistry Recent Labs  Lab 10/05/22 1332 10/05/22 1603  NA 140  --   K 4.0  --   CL 106  --   CO2 23  --   GLUCOSE 114*  --   BUN 11  --   CREATININE 0.68  --   CALCIUM  9.3  --   MG  --  1.9  GFRNONAA >60  --   ANIONGAP 11  --     Lipids No results for input(s): "CHOL", "TRIG", "HDL", "LABVLDL", "LDLCALC", "CHOLHDL" in the last 168 hours.  Hematology Recent Labs  Lab 10/05/22 1332  WBC 6.8  RBC 4.60  HGB 14.1  HCT 42.3  MCV 92.0  MCH 30.7  MCHC 33.3  RDW 14.3  PLT 256   Thyroid No results for input(s): "TSH", "FREET4" in the last 168 hours.  BNPNo results for input(s): "BNP", "PROBNP" in the last 168 hours.  DDimer No results for input(s): "DDIMER" in the last 168 hours.   Radiology    ECHOCARDIOGRAM COMPLETE  Result Date: 10/05/2022    ECHOCARDIOGRAM REPORT   Patient Name:   CAMYLLE PARHAM Date of Exam: 10/05/2022 Medical Rec #:  [id number]       Height:       59.0 in Accession #:    [id number]      Weight:       112.0 lb Date of Birth:  1946-04-28       BSA:          1.442 m Patient Age:    52 years        BP:           109/61 mmHg Patient Gender: F               HR:           65 bpm. Exam Location:  Inpatient Procedure: 2D Echo Indications:    chest pain  History:        Patient has prior history of Echocardiogram examinations, most                 recent 08/29/2021. CAD, Signs/Symptoms:Syncope; Risk                 Factors:Current Smoker and Hypertension.  Sonographer:    Johny Chess RDCS Referring Phys: [id number] Lequita Halt  Sonographer Comments: Global longitudinal strain was attempted. IMPRESSIONS  1. Left ventricular ejection fraction, by estimation, is 45%. The left ventricle has mildly decreased function. The left ventricle demonstrates regional wall motion abnormalities (see scoring diagram/findings for description). Left ventricular diastolic  parameters are consistent with Grade I diastolic dysfunction (impaired relaxation). The average left ventricular global longitudinal strain is -13.0 %. The global longitudinal strain is abnormal.  2. Abnormal inferior and inferolateral regional strain.  3. Right ventricular systolic function is  normal. The right ventricular size is normal. There is normal pulmonary artery systolic pressure. The estimated right ventricular systolic pressure is 123XX123 mmHg.  4. The mitral valve is grossly normal. Trivial mitral valve regurgitation.  5. The aortic valve is tricuspid. Aortic valve regurgitation is not visualized. No aortic stenosis is present.  6. The inferior vena cava is normal in size with greater than 50% respiratory variability, suggesting right atrial pressure of 3 mmHg. FINDINGS  Left Ventricle: Left ventricular ejection fraction, by estimation, is 45%. The left ventricle has mildly decreased function. The left ventricle demonstrates regional wall motion abnormalities. The average left ventricular global longitudinal strain is -13.0 %. The global longitudinal strain is abnormal. The left ventricular internal cavity size was normal in size. There is no left ventricular hypertrophy. Left ventricular diastolic parameters are consistent with Grade I diastolic dysfunction (impaired  relaxation).  LV Wall Scoring: The entire lateral wall is akinetic. Right Ventricle: The right ventricular size is normal. No increase in right ventricular wall thickness. Right ventricular systolic function is normal. There is normal pulmonary artery systolic pressure. The tricuspid regurgitant velocity is 1.87 m/s, and  with an assumed right atrial pressure of 3 mmHg, the estimated right ventricular systolic pressure is 123XX123 mmHg. Left Atrium: Left atrial size was normal in size. Right Atrium: Right atrial size was normal in size. Pericardium: Trivial pericardial effusion is present. The pericardial effusion is posterior to the left ventricle. Mitral Valve: 2D MVA 2.10 cm. The mitral valve is grossly normal. Trivial mitral valve regurgitation. Tricuspid Valve: The tricuspid valve is grossly normal. Tricuspid valve regurgitation is not demonstrated. No evidence of tricuspid stenosis. Aortic Valve: The aortic valve is tricuspid.  Aortic valve regurgitation is not visualized. No aortic stenosis is present. Pulmonic Valve: The pulmonic valve was not well visualized. Pulmonic valve regurgitation is not visualized. No evidence of pulmonic stenosis. Aorta: The aortic root and ascending aorta are structurally normal, with no evidence of dilitation. Venous: The inferior vena cava is normal in size with greater than 50% respiratory variability, suggesting right atrial  pressure of 3 mmHg. IAS/Shunts: No atrial level shunt detected by color flow Doppler.  LEFT VENTRICLE PLAX 2D LVIDd:         4.70 cm     Diastology LVIDs:         4.10 cm     LV e' medial:  6.53 cm/s LV PW:         0.90 cm     LV e' lateral: 9.46 cm/s LV IVS:        0.90 cm LVOT diam:     1.70 cm     2D Longitudinal Strain LV SV:         47          2D Strain GLS Avg:     -13.0 % LV SV Index:   33 LVOT Area:     2.27 cm  LV Volumes (MOD) LV vol d, MOD A4C: 68.0 ml LV vol s, MOD A4C: 44.2 ml LV SV MOD A4C:     68.0 ml RIGHT VENTRICLE             IVC RV Basal diam:  2.30 cm     IVC diam: 1.20 cm RV S prime:     10.80 cm/s TAPSE (M-mode): 1.9 cm LEFT ATRIUM             Index        RIGHT ATRIUM          Index LA diam:        3.80 cm 2.64 cm/m   RA Area:     9.10 cm LA Vol (A2C):   38.6 ml 26.78 ml/m  RA Volume:   17.70 ml 12.28 ml/m LA Vol (A4C):   28.0 ml 19.42 ml/m LA Biplane Vol: 33.6 ml 23.31 ml/m  AORTIC VALVE LVOT Vmax:   102.00 cm/s LVOT Vmean:  63.400 cm/s LVOT VTI:    0.209 m  AORTA Ao Root diam: 2.60 cm Ao Asc diam:  2.50 cm MITRAL VALVE                TRICUSPID VALVE MV Area (PHT): 3.03 cm     TR Peak grad:   14.0 mmHg MV Decel Time: 250 msec     TR Vmax:        187.00 cm/s MV A velocity: 132.00 cm/s                             SHUNTS                             Systemic VTI:  0.21 m                             Systemic Diam: 1.70 cm Rudean Haskell MD Electronically signed by Rudean Haskell MD Signature Date/Time: 10/05/2022/6:19:20 PM    Final    CT Head  Wo Contrast  Result Date: 10/05/2022 CLINICAL DATA:  Syncopal episode. EXAM: CT HEAD WITHOUT CONTRAST TECHNIQUE: Contiguous axial images were obtained from the base of the skull through the vertex without intravenous contrast. RADIATION DOSE REDUCTION: This exam was performed according to the departmental dose-optimization program which includes automated exposure control, adjustment of the mA and/or kV according to patient size and/or use of iterative reconstruction technique. COMPARISON:  12/21/2016 FINDINGS: Brain: No evidence of intracranial hemorrhage, acute infarction, hydrocephalus,  extra-axial collection, or mass lesion/mass effect. Mild diffuse cerebral atrophy appears stable. Vascular:  No hyperdense vessel or other acute findings. Skull: No evidence of fracture or other significant bone abnormality. Sinuses/Orbits:  No acute findings. Other: None. IMPRESSION: No acute intracranial abnormality. Stable mild cerebral atrophy. Electronically Signed   By: Marlaine Hind M.D.   On: 10/05/2022 15:04   DG Chest 2 View  Result Date: 10/05/2022 CLINICAL DATA:  Chest pain EXAM: CHEST - 2 VIEW COMPARISON:  Chest x-rays dated 06/21/2021 and 05/15/2019 FINDINGS: Cardiomegaly, grossly stable. Lungs are clear. No pleural effusion or pneumothorax is seen. No acute-appearing osseous abnormality. IMPRESSION: No active cardiopulmonary disease. No evidence of pneumonia or pulmonary edema. Cardiomegaly. Electronically Signed   By: Franki Cabot M.D.   On: 10/05/2022 14:38    Cardiac Studies   Echo 10/05/22:  1. Left ventricular ejection fraction, by estimation, is 45%. The left  ventricle has mildly decreased function. The left ventricle demonstrates  regional wall motion abnormalities (see scoring diagram/findings for  description). Left ventricular diastolic   parameters are consistent with Grade I diastolic dysfunction (impaired  relaxation). The average left ventricular global longitudinal strain is  -13.0 %.  The global longitudinal strain is abnormal.   2. Abnormal inferior and inferolateral regional strain.   3. Right ventricular systolic function is normal. The right ventricular  size is normal. There is normal pulmonary artery systolic pressure. The  estimated right ventricular systolic pressure is 123XX123 mmHg.   4. The mitral valve is grossly normal. Trivial mitral valve  regurgitation.   5. The aortic valve is tricuspid. Aortic valve regurgitation is not  visualized. No aortic stenosis is present.   6. The inferior vena cava is normal in size with greater than 50%  respiratory variability, suggesting right atrial pressure of 3 mmHg.    Patient Profile     77 y.o. female with a hx of CAD and known coronary spasm with recurrent chest pain and prior syncope who is being seen for CP and syncope.    Assessment & Plan    Chest pain CAD Has atypical features but given TWI on ECG and known RCA PCI, will plan for cath today  Mild cardiomyopathy Known LVEF 45% GDMT difficult given BP Does not appear volume up on exam, but she reports chronic orthopnea   Syncope Unclear etiology, but sounds consistent with a a vasovagal event - became hot and nauseous just before LOC Reports this has happened before, but has been "years" She did have a prodrome, while standing and singing with church choir - consider heart monitor at discharge - telemetry unrevealing - she has been instructed not to drive for 6 months     If third troponin negative and EKG stable, no further ischemic workup this admission.  - increase amlodipine to 2.5 mg BID - consider ranexa OP - 14 day zio for syncope - I will arrange cardiology follow up - 325 mg ASA listed on home medications - suspect this may have been left over from her shoulder surgery last year. Reduce back to 81 mg ASA   For questions or updates, please contact [place] Please consult [email or web address] for contact info under         Signed, Ledora Bottcher, PA  10/06/2022, 10:04 AM    Patient seen and examined and agree with Fabian Sharp, PA as detailed above.  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  Patient presented after having an episode of syncope. Specifically, she was standing in church about to sing when she felt very unwell and then passed out. Had missed breakfast that morning. When she woke up, she was complaining of sharp chest pain. In the ER, trop negative. ECG  with NSR with anterolateral TWI that appear worse than prior. Repeat TTE with stable LVEF 45% with lateral wall akinesis (chronic). Notably, coronary CTA in 08/2021 with mild disease in LAD and Lcx. Has known RCA stent but no obvious stenosis visualized (although exam limited).  Overall, chest pain is atypical with more pleuritic features, however, anterolateral TWI are concerning. Fortunately, Trop has remained negative. While this may be related to coronary vasospasm which has been noted in the past, given known CAD with prior RCA stent and TWI in anterolateral leads that appear worse than prior, will plan for cath today.   GEN: No acute distress.   Neck: No JVD Cardiac: Bradycardic, regular, no murmurs.  Respiratory: Clear to auscultation bilaterally. GI: Soft, nontender, non-distended  MS: No edema; No deformity. Neuro:  Nonfocal  Psych: Normal affect    Plan: -Plan for LHC today -Continue amlodipine 2.'5mg'$  daily -Start imdur '15mg'$  daily -Continue ASA, crestor '5mg'$  daily -Will plan for 2 week zio monitor on discharge for work-up of syncope if tele/above work-up unrevealing   Gwyndolyn Kaufman, MD

## 2022-10-06 NOTE — Progress Notes (Addendum)
SITE AREA:right groin/femoral  SITE PRIOR TO REMOVAL:  LEVEL 0  PRESSURE APPLIED FOR: approximately 20 minutes  MANUAL: yes  PATIENT STATUS DURING PULL: stable  POST PULL SITE:  LEVEL 0  POST PULL INSTRUCTIONS GIVEN: yes, Drsg to remain in place x24hrs, may shower tomorrow, no sitting in water, hot tubs, bath tubs or pools for 1 week, take it easy on stairs, no running , jumping etc, verbal acknowledgment noted    POST PULL PULSES PRESENT: bilateral pedal pulses palpable at +2  DRESSING APPLIED: gauze with tegaderm  BEDREST BEGINS @ 1529  COMMENTS: sheath removed by Frederico Hamman, 2H RN, this RN present

## 2022-10-06 NOTE — Progress Notes (Unsigned)
Enrolled for [employer] to mail a ZIO XT long term holter monitor to the patients address on file.   Dr. Pemberton to read. 

## 2022-10-06 NOTE — Progress Notes (Addendum)
PROGRESS NOTE    [name]  [id number] DOB: 1945-12-19 DOA: 10/05/2022 PCP: [name], MD  Outpatient Specialists: cardiology    Brief Narrative:   [name] is a 77 y.o. 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   Patient was in the church service this morning, standing, singing suddenly "feeling sick" and passed out.  Bystanders were able to catch her and lowered her down slowly and no fall.  Patient herself however cannot describe whether she had lightheaded blurry vision or nauseous vomiting or palpitation other than " feeling sick".  Bystanders did tell her that she looked pale and cool to touch and her breathing was slow and heavy.  EMS arrived and found the patient breathing heavily and placed her on oxygen.  And slowly patient woke up, but does not recall any part of the event.  No tongue biting no loss control of urine or bowel movements.  After she recovered her consciousness, she started to develop centrally located sharp-like chest pain 3-4/10, nonradiating no accompanying symptoms such as palpitations sweating nauseous vomiting.  She has been taking the same home medications with no changes recently.  She felt as usual yesterday and this morning before she went to the church.  Patient further reported that her cervical spine stenosis/radiculopathy has been fairly controlled no pain no weakness reported.   Assessment & Plan:   Active Problems:   Coronary artery vasospasm (HCC)   Syncope and collapse   Coronary artery disease   S/P angioplasty with stent   Chest pain   Atypical chest pain CAD Continues with non-exertional chest pain, new since syncopal event. Troponins remain negative. Hx cad, vasospasm, prior stent.  - cardiology following, plan for LHC today    Syncope -Probably vasovagal -Volume status appears to be neutral -Check orthostatic vital signs, have asked nurse to obtain and document -tte with regional wall motion abnormalities (pending lhc as above), ef 45% - ziopatch to be mailed to patient - home amlodipine on hold   CAD -As above. - asa 81 mg at d/c (not 325) - imdur started by cardiology today   Chronic combined HFrEF and HFpEF -Euvolemic, not on diuresis.  not on beta-blocker prophy for her chronic sinus bradycardia   IIDM -Hold off metformin for possible inpatient cardiology workup -Sliding scale for now.     DVT prophylaxis: lovenox Code Status: full Family Communication: none @ bedside  Level of care: Telemetry Medical Status is: Observation, will query UR    Consultants:  cardiology  Procedures: LHC pending  Antimicrobials:  none    Subjective: This morning no lightheadedness but says chest pain persists  Objective: Vitals:   10/06/22 1317 10/06/22 1320 10/06/22 1325 10/06/22 1340  BP:  (!) 120/46 (!) 118/48 (!) 119/47  Pulse: (!) 49 (!) 51 (!) 49 (!) 50  Resp: '17 17 17 16  '$ Temp:      TempSrc:      SpO2: 96% 96% 95% 93%  Weight:      Height:       No intake or output data in the 24 hours ending 10/06/22 1511 Filed Weights   10/05/22 1331 10/05/22 2032  Weight: 50.8 kg 53.8 kg    Examination:  General exam: Appears calm and comfortable  Respiratory system: Clear to auscultation. Respiratory effort normal. Cardiovascular system: S1 &  S2 heard, RRR.  Gastrointestinal system: Abdomen is nondistended, soft and nontender. No organomegaly or masses felt. Normal bowel sounds heard. Central nervous system: Alert and oriented. No focal neurological deficits. Extremities: Symmetric 5 x 5 power. Skin: No rashes, lesions or ulcers Psychiatry: Judgement and insight appear normal. Mood & affect appropriate.     Data Reviewed: I have personally reviewed following labs and imaging  studies  CBC: Recent Labs  Lab 10/05/22 1332  WBC 6.8  NEUTROABS 4.7  HGB 14.1  HCT 42.3  MCV 92.0  PLT 123456   Basic Metabolic Panel: Recent Labs  Lab 10/05/22 1332 10/05/22 1603  NA 140  --   K 4.0  --   CL 106  --   CO2 23  --   GLUCOSE 114*  --   BUN 11  --   CREATININE 0.68  --   CALCIUM 9.3  --   MG  --  1.9   GFR: Estimated Creatinine Clearance: 44.8 mL/min (by C-G formula based on SCr of 0.68 mg/dL). Liver Function Tests: No results for input(s): "AST", "ALT", "ALKPHOS", "BILITOT", "PROT", "ALBUMIN" in the last 168 hours. No results for input(s): "LIPASE", "AMYLASE" in the last 168 hours. No results for input(s): "AMMONIA" in the last 168 hours. Coagulation Profile: No results for input(s): "INR", "PROTIME" in the last 168 hours. Cardiac Enzymes: No results for input(s): "CKTOTAL", "CKMB", "CKMBINDEX", "TROPONINI" in the last 168 hours. BNP (last 3 results) No results for input(s): "PROBNP" in the last 8760 hours. HbA1C: No results for input(s): "HGBA1C" in the last 72 hours. CBG: Recent Labs  Lab 10/05/22 1355 10/05/22 1733 10/06/22 0038 10/06/22 0756 10/06/22 1320  GLUCAP 119* 114* 135* 106* 102*   Lipid Profile: No results for input(s): "CHOL", "HDL", "LDLCALC", "TRIG", "CHOLHDL", "LDLDIRECT" in the last 72 hours. Thyroid Function Tests: No results for input(s): "TSH", "T4TOTAL", "FREET4", "T3FREE", "THYROIDAB" in the last 72 hours. Anemia Panel: No results for input(s): "VITAMINB12", "FOLATE", "FERRITIN", "TIBC", "IRON", "RETICCTPCT" in the last 72 hours. Urine analysis:    Component Value Date/Time   COLORURINE YELLOW 10/29/2020 1357   APPEARANCEUR CLEAR 10/29/2020 1357   LABSPEC >1.046 (H) 10/29/2020 1357   PHURINE 5.0 10/29/2020 1357   GLUCOSEU NEGATIVE 10/29/2020 1357   HGBUR NEGATIVE 10/29/2020 1357   [place] 10/29/2020 1357   [place] 10/29/2020 1357   PROTEINUR NEGATIVE 10/29/2020 1357   UROBILINOGEN 1.0  03/09/2013 1215   NITRITE NEGATIVE 10/29/2020 1357   LEUKOCYTESUR MODERATE (A) 10/29/2020 1357   Sepsis Labs: '@LABRCNTIP'$ (procalcitonin:4,lacticidven:4)  ) Recent Results (from the past 240 hour(s))  Resp panel by RT-PCR (RSV, Flu A&B, Covid) Anterior Nasal Swab     Status: None   Collection Time: 10/05/22  1:56 PM   Specimen: Anterior Nasal Swab  Result Value Ref Range Status   SARS Coronavirus 2 by RT PCR NEGATIVE NEGATIVE Final   Influenza A by PCR NEGATIVE NEGATIVE Final   Influenza B by PCR NEGATIVE NEGATIVE Final    Comment: (NOTE) The Xpert Xpress SARS-CoV-2/FLU/RSV plus assay is intended as an aid in the diagnosis of influenza from Nasopharyngeal swab specimens and should not be used as a sole basis for treatment. Nasal washings and aspirates are unacceptable for Xpert Xpress SARS-CoV-2/FLU/RSV testing.  Fact Sheet for Patients: EntrepreneurPulse.com.au  Fact Sheet for Healthcare Providers: IncredibleEmployment.be  This test is not yet approved or cleared by the [place] FDA and has been authorized for detection and/or diagnosis of SARS-CoV-2 by FDA under an Emergency Use  Authorization (EUA). This EUA will remain in effect (meaning this test can be used) for the duration of the COVID-19 declaration under Section 564(b)(1) of the Act, 21 U.S.C. section 360bbb-3(b)(1), unless the authorization is terminated or revoked.     Resp Syncytial Virus by PCR NEGATIVE NEGATIVE Final    Comment: (NOTE) Fact Sheet for Patients: EntrepreneurPulse.com.au  Fact Sheet for Healthcare Providers: IncredibleEmployment.be  This test is not yet approved or cleared by the [place] FDA and has been authorized for detection and/or diagnosis of SARS-CoV-2 by FDA under an Emergency Use Authorization (EUA). This EUA will remain in effect (meaning this test can be used) for the duration of the COVID-19  declaration under Section 564(b)(1) of the Act, 21 U.S.C. section 360bbb-3(b)(1), unless the authorization is terminated or revoked.  Performed at [facility], [facility] [address] [place], [place] [address]          Radiology Studies: CARDIAC CATHETERIZATION  Result Date: 10/06/2022 Images from the original result were not included.   Prox LAD lesion is 30% stenosed.   Ost 1st Diag lesion is 40% stenosed.   Prox RCA to Mid RCA lesion is 25% stenosed. Antrice Hoch is a 77 y.o. female  [id number] LOCATION:  FACILITY: [facility] PHYSICIAN: Quay Burow, M.D. [birth year] DATE OF PROCEDURE:  10/06/2022 DATE OF DISCHARGE: CARDIAC CATHETERIZATION History obtained from chart review.77 y.o. female with a hx of CAD and known coronary spasm with recurrent chest pain and prior syncope who is being seen for CP and syncope.   Her troponins were negative.  Her EKG did show lateral T wave inversion.  Because of this, Dr. Johney Frame felt that it was prudent to pursue coronary angiography to rule out an ischemic etiology.   Ms. Rosillo has a widely patent RCA stent and otherwise no significant CAD.  LVEDP was 15.  I do not think that her chest pain was ischemically mediated.  The sheath will be removed and pressure held in the groin to achieve hemostasis.  The right radial sheath was removed and a TR band was placed to achieve hemostasis.  The patient left lab in stable condition.  Dr. Johney Frame, the patient's attending cardiologist, was made aware of these results.  I anticipate discharge later today. Quay Burow. MD, [facility] 10/06/2022 1:05 PM    VAS US CAROTID  Result Date: 10/06/2022 Carotid Arterial Duplex Study Patient Name:  NATALEA KRENGEL  Date of Exam:   10/06/2022 Medical Rec #: [id number]        Accession #:    [id number] Date of Birth: Dec 15, 1945        Patient Gender: F Patient Age:   55 years Exam Location:  [facility] Procedure:      VAS US CAROTID Referring Phys: Wynetta Fines  --------------------------------------------------------------------------------  Indications:       Carotid artery disease and Syncope. Risk Factors:      Hypertension, hyperlipidemia, Diabetes, current smoker. Comparison Study:  No prior study. Performing Technologist: McKayla Maag RVT, [place]  Examination Guidelines: A complete evaluation includes B-mode imaging, spectral Doppler, color Doppler, and power Doppler as needed of all accessible portions of each vessel. Bilateral testing is considered an integral part of a complete examination. Limited examinations for reoccurring indications may be performed as noted.  Right Carotid Findings: +----------+--------+--------+--------+---------------------+------------------+           PSV cm/sEDV cm/sStenosisPlaque Description   Comments           +----------+--------+--------+--------+---------------------+------------------+ CCA Prox  92  14                                                      +----------+--------+--------+--------+---------------------+------------------+ CCA Distal71      11                                   intimal thickening +----------+--------+--------+--------+---------------------+------------------+ ICA Prox  52      12      1-39%   smooth and                                                                homogeneous                             +----------+--------+--------+--------+---------------------+------------------+ ICA Mid   65      14                                                      +----------+--------+--------+--------+---------------------+------------------+ ICA Distal110     30                                                      +----------+--------+--------+--------+---------------------+------------------+ ECA       61      11                                   intimal thickening +----------+--------+--------+--------+---------------------+------------------+  +----------+--------+-------+----------------+-------------------+           PSV cm/sEDV cmsDescribe        Arm Pressure (mmHG) +----------+--------+-------+----------------+-------------------+ [id number]            Multiphasic, WNL                    +----------+--------+-------+----------------+-------------------+ +---------+--------+--+--------+--+---------+ VertebralPSV cm/s44EDV cm/s12Antegrade +---------+--------+--+--------+--+---------+  Left Carotid Findings: +----------+--------+--------+--------+------------------+------------------+           PSV cm/sEDV cm/sStenosisPlaque DescriptionComments           +----------+--------+--------+--------+------------------+------------------+ CCA Prox  159     25                                                   +----------+--------+--------+--------+------------------+------------------+ CCA Distal58      17                                intimal thickening +----------+--------+--------+--------+------------------+------------------+ ICA Prox  82      24  1-39%                     intimal thickening +----------+--------+--------+--------+------------------+------------------+ ICA Mid   76      21                                                   +----------+--------+--------+--------+------------------+------------------+ ICA Distal88      17                                                   +----------+--------+--------+--------+------------------+------------------+ ECA       89      10                                intimal thickening +----------+--------+--------+--------+------------------+------------------+ +----------+--------+--------+----------------+-------------------+           PSV cm/sEDV cm/sDescribe        Arm Pressure (mmHG) +----------+--------+--------+----------------+-------------------+ [id number]             Multiphasic, WNL                     +----------+--------+--------+----------------+-------------------+ +---------+--------+--+--------+--+---------+ VertebralPSV cm/s64EDV cm/s13Antegrade +---------+--------+--+--------+--+---------+   Summary: Right Carotid: Velocities in the right ICA are consistent with a 1-39% stenosis. Left Carotid: Velocities in the left ICA are consistent with a 1-39% stenosis. Vertebrals:  Bilateral vertebral arteries demonstrate antegrade flow. Subclavians: Normal flow hemodynamics were seen in bilateral subclavian              arteries. *See table(s) above for measurements and observations.     Preliminary    ECHOCARDIOGRAM COMPLETE  Result Date: 10/05/2022    ECHOCARDIOGRAM REPORT   Patient Name:   ASHANTA PENNY Date of Exam: 10/05/2022 Medical Rec #:  [id number]       Height:       59.0 in Accession #:    [id number]      Weight:       112.0 lb Date of Birth:  10/28/1945       BSA:          1.442 m Patient Age:    64 years        BP:           109/61 mmHg Patient Gender: F               HR:           65 bpm. Exam Location:  Inpatient Procedure: 2D Echo Indications:    chest pain  History:        Patient has prior history of Echocardiogram examinations, most                 recent 08/29/2021. CAD, Signs/Symptoms:Syncope; Risk                 Factors:Current Smoker and Hypertension.  Sonographer:    Johny Chess RDCS Referring Phys: [id number] Lequita Halt  Sonographer Comments: Global longitudinal strain was attempted. IMPRESSIONS  1. Left ventricular ejection fraction, by estimation, is 45%. The left ventricle has mildly decreased function. The left ventricle demonstrates regional wall motion abnormalities (see scoring diagram/findings for  description). Left ventricular diastolic  parameters are consistent with Grade I diastolic dysfunction (impaired relaxation). The average left ventricular global longitudinal strain is -13.0 %. The global longitudinal strain is abnormal.  2. Abnormal inferior and  inferolateral regional strain.  3. Right ventricular systolic function is normal. The right ventricular size is normal. There is normal pulmonary artery systolic pressure. The estimated right ventricular systolic pressure is 123XX123 mmHg.  4. The mitral valve is grossly normal. Trivial mitral valve regurgitation.  5. The aortic valve is tricuspid. Aortic valve regurgitation is not visualized. No aortic stenosis is present.  6. The inferior vena cava is normal in size with greater than 50% respiratory variability, suggesting right atrial pressure of 3 mmHg. FINDINGS  Left Ventricle: Left ventricular ejection fraction, by estimation, is 45%. The left ventricle has mildly decreased function. The left ventricle demonstrates regional wall motion abnormalities. The average left ventricular global longitudinal strain is -13.0 %. The global longitudinal strain is abnormal. The left ventricular internal cavity size was normal in size. There is no left ventricular hypertrophy. Left ventricular diastolic parameters are consistent with Grade I diastolic dysfunction (impaired  relaxation).  LV Wall Scoring: The entire lateral wall is akinetic. Right Ventricle: The right ventricular size is normal. No increase in right ventricular wall thickness. Right ventricular systolic function is normal. There is normal pulmonary artery systolic pressure. The tricuspid regurgitant velocity is 1.87 m/s, and  with an assumed right atrial pressure of 3 mmHg, the estimated right ventricular systolic pressure is 123XX123 mmHg. Left Atrium: Left atrial size was normal in size. Right Atrium: Right atrial size was normal in size. Pericardium: Trivial pericardial effusion is present. The pericardial effusion is posterior to the left ventricle. Mitral Valve: 2D MVA 2.10 cm. The mitral valve is grossly normal. Trivial mitral valve regurgitation. Tricuspid Valve: The tricuspid valve is grossly normal. Tricuspid valve regurgitation is not demonstrated. No  evidence of tricuspid stenosis. Aortic Valve: The aortic valve is tricuspid. Aortic valve regurgitation is not visualized. No aortic stenosis is present. Pulmonic Valve: The pulmonic valve was not well visualized. Pulmonic valve regurgitation is not visualized. No evidence of pulmonic stenosis. Aorta: The aortic root and ascending aorta are structurally normal, with no evidence of dilitation. Venous: The inferior vena cava is normal in size with greater than 50% respiratory variability, suggesting right atrial pressure of 3 mmHg. IAS/Shunts: No atrial level shunt detected by color flow Doppler.  LEFT VENTRICLE PLAX 2D LVIDd:         4.70 cm     Diastology LVIDs:         4.10 cm     LV e' medial:  6.53 cm/s LV PW:         0.90 cm     LV e' lateral: 9.46 cm/s LV IVS:        0.90 cm LVOT diam:     1.70 cm     2D Longitudinal Strain LV SV:         47          2D Strain GLS Avg:     -13.0 % LV SV Index:   33 LVOT Area:     2.27 cm  LV Volumes (MOD) LV vol d, MOD A4C: 68.0 ml LV vol s, MOD A4C: 44.2 ml LV SV MOD A4C:     68.0 ml RIGHT VENTRICLE             IVC RV Basal diam:  2.30 cm  IVC diam: 1.20 cm RV S prime:     10.80 cm/s TAPSE (M-mode): 1.9 cm LEFT ATRIUM             Index        RIGHT ATRIUM          Index LA diam:        3.80 cm 2.64 cm/m   RA Area:     9.10 cm LA Vol (A2C):   38.6 ml 26.78 ml/m  RA Volume:   17.70 ml 12.28 ml/m LA Vol (A4C):   28.0 ml 19.42 ml/m LA Biplane Vol: 33.6 ml 23.31 ml/m  AORTIC VALVE LVOT Vmax:   102.00 cm/s LVOT Vmean:  63.400 cm/s LVOT VTI:    0.209 m  AORTA Ao Root diam: 2.60 cm Ao Asc diam:  2.50 cm MITRAL VALVE                TRICUSPID VALVE MV Area (PHT): 3.03 cm     TR Peak grad:   14.0 mmHg MV Decel Time: 250 msec     TR Vmax:        187.00 cm/s MV A velocity: 132.00 cm/s                             SHUNTS                             Systemic VTI:  0.21 m                             Systemic Diam: 1.70 cm Rudean Haskell MD Electronically signed by Rudean Haskell MD Signature Date/Time: 10/05/2022/6:19:20 PM    Final    CT Head Wo Contrast  Result Date: 10/05/2022 CLINICAL DATA:  Syncopal episode. EXAM: CT HEAD WITHOUT CONTRAST TECHNIQUE: Contiguous axial images were obtained from the base of the skull through the vertex without intravenous contrast. RADIATION DOSE REDUCTION: This exam was performed according to the departmental dose-optimization program which includes automated exposure control, adjustment of the mA and/or kV according to patient size and/or use of iterative reconstruction technique. COMPARISON:  12/21/2016 FINDINGS: Brain: No evidence of intracranial hemorrhage, acute infarction, hydrocephalus, extra-axial collection, or mass lesion/mass effect. Mild diffuse cerebral atrophy appears stable. Vascular:  No hyperdense vessel or other acute findings. Skull: No evidence of fracture or other significant bone abnormality. Sinuses/Orbits:  No acute findings. Other: None. IMPRESSION: No acute intracranial abnormality. Stable mild cerebral atrophy. Electronically Signed   By: Marlaine Hind M.D.   On: 10/05/2022 15:04   DG Chest 2 View  Result Date: 10/05/2022 CLINICAL DATA:  Chest pain EXAM: CHEST - 2 VIEW COMPARISON:  Chest x-rays dated 06/21/2021 and 05/15/2019 FINDINGS: Cardiomegaly, grossly stable. Lungs are clear. No pleural effusion or pneumothorax is seen. No acute-appearing osseous abnormality. IMPRESSION: No active cardiopulmonary disease. No evidence of pneumonia or pulmonary edema. Cardiomegaly. Electronically Signed   By: Franki Cabot M.D.   On: 10/05/2022 14:38        Scheduled Meds:  [MAR Hold] amLODipine  2.5 mg Oral Daily   [START ON 10/07/2022] aspirin  81 mg Oral Pre-Cath   aspirin  81 mg Oral Daily   [MAR Hold] enoxaparin (LOVENOX) injection  40 mg Subcutaneous Q24H   [MAR Hold] insulin aspart  0-15 Units Subcutaneous TID WC   [MAR Hold] isosorbide  mononitrate  15 mg Oral Daily   [MAR Hold] methocarbamol  500 mg  Oral QID   [MAR Hold] pantoprazole  40 mg Oral BID   [MAR Hold] rosuvastatin  5 mg Oral q1800   [MAR Hold] sodium chloride flush  3 mL Intravenous Q12H   Continuous Infusions:  sodium chloride     sodium chloride 75 mL/hr at 10/06/22 1335   sodium chloride       LOS: 0 days     Desma Maxim, MD [facility]   If 7PM-7AM, please contact night-coverage [email or web address] Password [facility] 10/06/2022, 3:11 PM

## 2022-10-06 NOTE — Progress Notes (Signed)
Bilateral carotid ultrasound study completed.   Please see CV Procedures for preliminary results.  Arnika Larzelere, RVT  12:04 PM 10/06/22

## 2022-10-06 NOTE — Progress Notes (Signed)
14 day zio for syncope - Dr. Johney Frame to read

## 2022-10-07 ENCOUNTER — Encounter (HOSPITAL_COMMUNITY): Payer: Self-pay | Admitting: Cardiovascular Disease

## 2022-10-07 DIAGNOSIS — R072 Precordial pain: Secondary | ICD-10-CM

## 2022-10-07 DIAGNOSIS — R55 Syncope and collapse: Secondary | ICD-10-CM | POA: Diagnosis present

## 2022-10-07 DIAGNOSIS — Z9049 Acquired absence of other specified parts of digestive tract: Secondary | ICD-10-CM | POA: Diagnosis not present

## 2022-10-07 DIAGNOSIS — R001 Bradycardia, unspecified: Secondary | ICD-10-CM | POA: Diagnosis present

## 2022-10-07 DIAGNOSIS — K219 Gastro-esophageal reflux disease without esophagitis: Secondary | ICD-10-CM | POA: Diagnosis present

## 2022-10-07 DIAGNOSIS — Z96611 Presence of right artificial shoulder joint: Secondary | ICD-10-CM | POA: Diagnosis present

## 2022-10-07 DIAGNOSIS — R0789 Other chest pain: Secondary | ICD-10-CM | POA: Diagnosis not present

## 2022-10-07 DIAGNOSIS — Z7982 Long term (current) use of aspirin: Secondary | ICD-10-CM | POA: Diagnosis not present

## 2022-10-07 DIAGNOSIS — I25118 Atherosclerotic heart disease of native coronary artery with other forms of angina pectoris: Secondary | ICD-10-CM | POA: Diagnosis not present

## 2022-10-07 DIAGNOSIS — I252 Old myocardial infarction: Secondary | ICD-10-CM | POA: Diagnosis not present

## 2022-10-07 DIAGNOSIS — E119 Type 2 diabetes mellitus without complications: Secondary | ICD-10-CM | POA: Diagnosis present

## 2022-10-07 DIAGNOSIS — Z79899 Other long term (current) drug therapy: Secondary | ICD-10-CM | POA: Diagnosis not present

## 2022-10-07 DIAGNOSIS — Z955 Presence of coronary angioplasty implant and graft: Secondary | ICD-10-CM | POA: Diagnosis not present

## 2022-10-07 DIAGNOSIS — Z981 Arthrodesis status: Secondary | ICD-10-CM | POA: Diagnosis not present

## 2022-10-07 DIAGNOSIS — E7800 Pure hypercholesterolemia, unspecified: Secondary | ICD-10-CM | POA: Diagnosis present

## 2022-10-07 DIAGNOSIS — M4802 Spinal stenosis, cervical region: Secondary | ICD-10-CM | POA: Diagnosis present

## 2022-10-07 DIAGNOSIS — Z823 Family history of stroke: Secondary | ICD-10-CM | POA: Diagnosis not present

## 2022-10-07 DIAGNOSIS — F17210 Nicotine dependence, cigarettes, uncomplicated: Secondary | ICD-10-CM | POA: Diagnosis present

## 2022-10-07 DIAGNOSIS — I200 Unstable angina: Secondary | ICD-10-CM | POA: Diagnosis present

## 2022-10-07 DIAGNOSIS — I110 Hypertensive heart disease with heart failure: Secondary | ICD-10-CM | POA: Diagnosis present

## 2022-10-07 DIAGNOSIS — Z8249 Family history of ischemic heart disease and other diseases of the circulatory system: Secondary | ICD-10-CM | POA: Diagnosis not present

## 2022-10-07 DIAGNOSIS — I5042 Chronic combined systolic (congestive) and diastolic (congestive) heart failure: Secondary | ICD-10-CM | POA: Diagnosis present

## 2022-10-07 DIAGNOSIS — I2510 Atherosclerotic heart disease of native coronary artery without angina pectoris: Secondary | ICD-10-CM | POA: Diagnosis present

## 2022-10-07 DIAGNOSIS — Z1152 Encounter for screening for COVID-19: Secondary | ICD-10-CM | POA: Diagnosis not present

## 2022-10-07 DIAGNOSIS — Z7984 Long term (current) use of oral hypoglycemic drugs: Secondary | ICD-10-CM | POA: Diagnosis not present

## 2022-10-07 DIAGNOSIS — I429 Cardiomyopathy, unspecified: Secondary | ICD-10-CM | POA: Diagnosis present

## 2022-10-07 LAB — GLUCOSE, CAPILLARY
Glucose-Capillary: 117 mg/dL — ABNORMAL HIGH (ref 70–99)
Glucose-Capillary: 96 mg/dL (ref 70–99)

## 2022-10-07 LAB — BASIC METABOLIC PANEL
Anion gap: 9 (ref 5–15)
BUN: 18 mg/dL (ref 8–23)
CO2: 25 mmol/L (ref 22–32)
Calcium: 8.9 mg/dL (ref 8.9–10.3)
Chloride: 105 mmol/L (ref 98–111)
Creatinine, Ser: 0.81 mg/dL (ref 0.44–1.00)
GFR, Estimated: >60 mL/min (ref >60)
Glucose, Bld: 115 mg/dL — ABNORMAL HIGH (ref 70–99)
Potassium: 4.1 mmol/L (ref 3.5–5.1)
Sodium: 139 mmol/L (ref 135–145)

## 2022-10-07 LAB — HEMOGLOBIN A1C
Hgb A1c MFr Bld: 6.6 % — ABNORMAL HIGH (ref 4.8–5.6)
Mean Plasma Glucose: 143 mg/dL

## 2022-10-07 MED ORDER — ISOSORBIDE MONONITRATE ER 30 MG PO TB24: 15.0000 mg | ORAL_TABLET | Freq: Every day | ORAL | 3 refills | Status: DC

## 2022-10-07 NOTE — Plan of Care (Signed)
Problem: Education: Goal: Ability to describe self-care measures that may prevent or decrease complications (Diabetes Survival Skills Education) will improve Outcome: Adequate for Discharge Goal: Individualized Educational Video(s) Outcome: Adequate for Discharge   Problem: Coping: Goal: Ability to adjust to condition or change in health will improve Outcome: Adequate for Discharge   Problem: Fluid Volume: Goal: Ability to maintain a balanced intake and output will improve Outcome: Adequate for Discharge   Problem: Health Behavior/Discharge Planning: Goal: Ability to identify and utilize available resources and services will improve Outcome: Adequate for Discharge Goal: Ability to manage health-related needs will improve Outcome: Adequate for Discharge   Problem: Metabolic: Goal: Ability to maintain appropriate glucose levels will improve Outcome: Adequate for Discharge   Problem: Nutritional: Goal: Maintenance of adequate nutrition will improve Outcome: Adequate for Discharge Goal: Progress toward achieving an optimal weight will improve Outcome: Adequate for Discharge   Problem: Skin Integrity: Goal: Risk for impaired skin integrity will decrease Outcome: Adequate for Discharge   Problem: Tissue Perfusion: Goal: Adequacy of tissue perfusion will improve Outcome: Adequate for Discharge   Problem: Education: Goal: Understanding of cardiac disease, CV risk reduction, and recovery process will improve Outcome: Adequate for Discharge Goal: Individualized Educational Video(s) Outcome: Adequate for Discharge   Problem: Activity: Goal: Ability to tolerate increased activity will improve Outcome: Adequate for Discharge   Problem: Cardiac: Goal: Ability to achieve and maintain adequate cardiovascular perfusion will improve Outcome: Adequate for Discharge   Problem: Health Behavior/Discharge Planning: Goal: Ability to safely manage health-related needs after discharge  will improve Outcome: Adequate for Discharge   Problem: Education: Goal: Knowledge of General Education information will improve Description: Including pain rating scale, medication(s)/side effects and non-pharmacologic comfort measures Outcome: Adequate for Discharge   Problem: Health Behavior/Discharge Planning: Goal: Ability to manage health-related needs will improve Outcome: Adequate for Discharge   Problem: Clinical Measurements: Goal: Ability to maintain clinical measurements within normal limits will improve Outcome: Adequate for Discharge Goal: Will remain free from infection Outcome: Adequate for Discharge Goal: Diagnostic test results will improve Outcome: Adequate for Discharge Goal: Respiratory complications will improve Outcome: Adequate for Discharge Goal: Cardiovascular complication will be avoided Outcome: Adequate for Discharge   Problem: Activity: Goal: Risk for activity intolerance will decrease Outcome: Adequate for Discharge   Problem: Nutrition: Goal: Adequate nutrition will be maintained Outcome: Adequate for Discharge   Problem: Coping: Goal: Level of anxiety will decrease Outcome: Adequate for Discharge   Problem: Elimination: Goal: Will not experience complications related to bowel motility Outcome: Adequate for Discharge Goal: Will not experience complications related to urinary retention Outcome: Adequate for Discharge   Problem: Pain Managment: Goal: General experience of comfort will improve Outcome: Adequate for Discharge   Problem: Safety: Goal: Ability to remain free from injury will improve Outcome: Adequate for Discharge   Problem: Skin Integrity: Goal: Risk for impaired skin integrity will decrease Outcome: Adequate for Discharge   Problem: Education: Goal: Understanding of CV disease, CV risk reduction, and recovery process will improve Outcome: Adequate for Discharge Goal: Individualized Educational Video(s) Outcome:  Adequate for Discharge   Problem: Activity: Goal: Ability to return to baseline activity level will improve Outcome: Adequate for Discharge   Problem: Cardiovascular: Goal: Ability to achieve and maintain adequate cardiovascular perfusion will improve Outcome: Adequate for Discharge Goal: Vascular access site(s) Level 0-1 will be maintained Outcome: Adequate for Discharge   Problem: Health Behavior/Discharge Planning: Goal: Ability to safely manage health-related needs after discharge will improve Outcome: Adequate for Discharge

## 2022-10-07 NOTE — Progress Notes (Addendum)
Rounding Note    Patient Name: [name] Date of Encounter: 10/07/2022  [place] Cardiologist: Freada Bergeron, MD   Subjective   Continues to have chest pressure and complains of right groin pain and leg stiffness. She wants to go home.  Inpatient Medications    Scheduled Meds:  amLODipine  2.5 mg Oral Daily   aspirin  81 mg Oral Daily   enoxaparin (LOVENOX) injection  40 mg Subcutaneous Q24H   insulin aspart  0-15 Units Subcutaneous TID WC   isosorbide mononitrate  15 mg Oral Daily   methocarbamol  500 mg Oral QID   pantoprazole  40 mg Oral BID   rosuvastatin  5 mg Oral q1800   sodium chloride flush  3 mL Intravenous Q12H   sodium chloride flush  3 mL Intravenous Q12H   Continuous Infusions:  sodium chloride     PRN Meds: sodium chloride, acetaminophen, acetaminophen, HYDROcodone-acetaminophen, morphine injection, ondansetron (ZOFRAN) IV, ondansetron (ZOFRAN) IV, ondansetron, oxyCODONE, senna-docusate, sodium chloride flush, traZODone   Vital Signs    Vitals:   10/06/22 2014 10/07/22 0000 10/07/22 0347 10/07/22 0645  BP: (!) 92/53 (!) 91/55 (!) 93/58 (!) 135/54  Pulse: 63 64 60 (!) 55  Resp: '16 16 16   '$ Temp: 98.5 F (36.9 C)  (!) 97.5 F (36.4 C) 97.9 F (36.6 C)  TempSrc: Oral  Axillary Oral  SpO2: 96% 95% 96% 98%  Weight:      Height:        Intake/Output Summary (Last 24 hours) at 10/07/2022 0805 Last data filed at 10/06/2022 1808 Gross per 24 hour  Intake 1470.18 ml  Output --  Net 1470.18 ml      10/05/2022    8:32 PM 10/05/2022    1:31 PM 04/07/2022    9:56 AM  Last 3 Weights  Weight (lbs) 118 lb 11.2 oz 112 lb 112 lb  Weight (kg) 53.842 kg 50.803 kg 50.803 kg      Telemetry    Sinus bradycardia with HR 50s - Personally Reviewed  ECG    No new tracings - Personally Reviewed  Physical Exam   GEN: No acute distress.   Neck: No JVD Cardiac: RRR, systolic murmur Respiratory: Clear to auscultation  bilaterally. GI: Soft, nontender, non-distended  MS: No edema; No deformity. Neuro:  Nonfocal  Psych: Normal affect  Right radial cath site C/D/I Right groin cath site covered, no hematoma  Labs    High Sensitivity Troponin:   Recent Labs  Lab 10/05/22 1344 10/05/22 1603 10/06/22 0934  TROPONINIHS '9 16 10     '$ Chemistry Recent Labs  Lab 10/05/22 1332 10/05/22 1603 10/07/22 0158  NA 140  --  139  K 4.0  --  4.1  CL 106  --  105  CO2 23  --  25  GLUCOSE 114*  --  115*  BUN 11  --  18  CREATININE 0.68  --  0.81  CALCIUM 9.3  --  8.9  MG  --  1.9  --   GFRNONAA >60  --  >60  ANIONGAP 11  --  9    Lipids No results for input(s): "CHOL", "TRIG", "HDL", "LABVLDL", "LDLCALC", "CHOLHDL" in the last 168 hours.  Hematology Recent Labs  Lab 10/05/22 1332  WBC 6.8  RBC 4.60  HGB 14.1  HCT 42.3  MCV 92.0  MCH 30.7  MCHC 33.3  RDW 14.3  PLT 256   Thyroid No results for input(s): "TSH", "FREET4"  in the last 168 hours.  BNPNo results for input(s): "BNP", "PROBNP" in the last 168 hours.  DDimer No results for input(s): "DDIMER" in the last 168 hours.   Radiology    VAS US CAROTID  Result Date: 10/06/2022 Carotid Arterial Duplex Study Patient Name:  [name]  Date of Exam:   10/06/2022 Medical Rec #: [id number]        Accession #:    [id number] Date of Birth: 27-Jul-1946        Patient Gender: F Patient Age:   77 years Exam Location:  [facility] Procedure:      VAS US CAROTID Referring Phys: Wynetta Fines --------------------------------------------------------------------------------  Indications:       Carotid artery disease and Syncope. Risk Factors:      Hypertension, hyperlipidemia, Diabetes, current smoker. Comparison Study:  No prior study. Performing Technologist: McKayla Maag RVT, [place]  Examination Guidelines: A complete evaluation includes B-mode imaging, spectral Doppler, color Doppler, and power Doppler as needed of all accessible portions of each vessel.  Bilateral testing is considered an integral part of a complete examination. Limited examinations for reoccurring indications may be performed as noted.  Right Carotid Findings: +----------+--------+--------+--------+---------------------+------------------+           PSV cm/sEDV cm/sStenosisPlaque Description   Comments           +----------+--------+--------+--------+---------------------+------------------+ CCA Prox  92      14                                                      +----------+--------+--------+--------+---------------------+------------------+ CCA Distal71      11                                   intimal thickening +----------+--------+--------+--------+---------------------+------------------+ ICA Prox  52      12      1-39%   smooth and                                                                homogeneous                             +----------+--------+--------+--------+---------------------+------------------+ ICA Mid   65      14                                                      +----------+--------+--------+--------+---------------------+------------------+ ICA Distal110     30                                                      +----------+--------+--------+--------+---------------------+------------------+ ECA       61      11  intimal thickening +----------+--------+--------+--------+---------------------+------------------+ +----------+--------+-------+----------------+-------------------+           PSV cm/sEDV cmsDescribe        Arm Pressure (mmHG) +----------+--------+-------+----------------+-------------------+ [id number]            Multiphasic, WNL                    +----------+--------+-------+----------------+-------------------+ +---------+--------+--+--------+--+---------+ VertebralPSV cm/s44EDV cm/s12Antegrade +---------+--------+--+--------+--+---------+   Left Carotid Findings: +----------+--------+--------+--------+------------------+------------------+           PSV cm/sEDV cm/sStenosisPlaque DescriptionComments           +----------+--------+--------+--------+------------------+------------------+ CCA Prox  159     25                                                   +----------+--------+--------+--------+------------------+------------------+ CCA Distal58      17                                intimal thickening +----------+--------+--------+--------+------------------+------------------+ ICA Prox  82      24      1-39%                     intimal thickening +----------+--------+--------+--------+------------------+------------------+ ICA Mid   76      21                                                   +----------+--------+--------+--------+------------------+------------------+ ICA Distal88      17                                                   +----------+--------+--------+--------+------------------+------------------+ ECA       89      10                                intimal thickening +----------+--------+--------+--------+------------------+------------------+ +----------+--------+--------+----------------+-------------------+           PSV cm/sEDV cm/sDescribe        Arm Pressure (mmHG) +----------+--------+--------+----------------+-------------------+ [id number]             Multiphasic, WNL                    +----------+--------+--------+----------------+-------------------+ +---------+--------+--+--------+--+---------+ VertebralPSV cm/s64EDV cm/s13Antegrade +---------+--------+--+--------+--+---------+   Summary: Right Carotid: Velocities in the right ICA are consistent with a 1-39% stenosis. Left Carotid: Velocities in the left ICA are consistent with a 1-39% stenosis. Vertebrals:  Bilateral vertebral arteries demonstrate antegrade flow. Subclavians: Normal flow hemodynamics  were seen in bilateral subclavian              arteries. *See table(s) above for measurements and observations.  Electronically signed by Servando Snare MD on 10/06/2022 at 5:16:18 PM.    Final    CARDIAC CATHETERIZATION  Result Date: 10/06/2022 Images from the original result were not included.   Prox LAD lesion is 30% stenosed.   Ost 1st Diag lesion is 40% stenosed.   Prox RCA to Mid RCA lesion is  25% stenosed. [name] is a 77 y.o. female  [id number] LOCATION:  FACILITY: [facility] PHYSICIAN: Quay Burow, M.D. 1946/07/18 DATE OF PROCEDURE:  10/06/2022 DATE OF DISCHARGE: CARDIAC CATHETERIZATION History obtained from chart review.77 y.o. female with a hx of CAD and known coronary spasm with recurrent chest pain and prior syncope who is being seen for CP and syncope.   Her troponins were negative.  Her EKG did show lateral T wave inversion.  Because of this, Dr. Johney Frame felt that it was prudent to pursue coronary angiography to rule out an ischemic etiology.   Ms. Padmanabhan has a widely patent RCA stent and otherwise no significant CAD.  LVEDP was 15.  I do not think that her chest pain was ischemically mediated.  The sheath will be removed and pressure held in the groin to achieve hemostasis.  The right radial sheath was removed and a TR band was placed to achieve hemostasis.  The patient left lab in stable condition.  Dr. Johney Frame, the patient's attending cardiologist, was made aware of these results.  I anticipate discharge later today. Quay Burow. MD, [facility] 10/06/2022 1:05 PM    ECHOCARDIOGRAM COMPLETE  Result Date: 10/05/2022    ECHOCARDIOGRAM REPORT   Patient Name:   [name] Date of Exam: 10/05/2022 Medical Rec #:  [id number]       Height:       59.0 in Accession #:    [id number]      Weight:       112.0 lb Date of Birth:  1946-04-01       BSA:          1.442 m Patient Age:    48 years        BP:           109/61 mmHg Patient Gender: F               HR:           65 bpm. Exam Location:   Inpatient Procedure: 2D Echo Indications:    chest pain  History:        Patient has prior history of Echocardiogram examinations, most                 recent 08/29/2021. CAD, Signs/Symptoms:Syncope; Risk                 Factors:Current Smoker and Hypertension.  Sonographer:    Johny Chess RDCS Referring Phys: [id number] Lequita Halt  Sonographer Comments: Global longitudinal strain was attempted. IMPRESSIONS  1. Left ventricular ejection fraction, by estimation, is 45%. The left ventricle has mildly decreased function. The left ventricle demonstrates regional wall motion abnormalities (see scoring diagram/findings for description). Left ventricular diastolic  parameters are consistent with Grade I diastolic dysfunction (impaired relaxation). The average left ventricular global longitudinal strain is -13.0 %. The global longitudinal strain is abnormal.  2. Abnormal inferior and inferolateral regional strain.  3. Right ventricular systolic function is normal. The right ventricular size is normal. There is normal pulmonary artery systolic pressure. The estimated right ventricular systolic pressure is 123XX123 mmHg.  4. The mitral valve is grossly normal. Trivial mitral valve regurgitation.  5. The aortic valve is tricuspid. Aortic valve regurgitation is not visualized. No aortic stenosis is present.  6. The inferior vena cava is normal in size with greater than 50% respiratory variability, suggesting right atrial pressure of 3 mmHg. FINDINGS  Left Ventricle: Left ventricular ejection fraction, by estimation, is 45%. The left ventricle  has mildly decreased function. The left ventricle demonstrates regional wall motion abnormalities. The average left ventricular global longitudinal strain is -13.0 %. The global longitudinal strain is abnormal. The left ventricular internal cavity size was normal in size. There is no left ventricular hypertrophy. Left ventricular diastolic parameters are consistent with Grade I diastolic  dysfunction (impaired  relaxation).  LV Wall Scoring: The entire lateral wall is akinetic. Right Ventricle: The right ventricular size is normal. No increase in right ventricular wall thickness. Right ventricular systolic function is normal. There is normal pulmonary artery systolic pressure. The tricuspid regurgitant velocity is 1.87 m/s, and  with an assumed right atrial pressure of 3 mmHg, the estimated right ventricular systolic pressure is 123XX123 mmHg. Left Atrium: Left atrial size was normal in size. Right Atrium: Right atrial size was normal in size. Pericardium: Trivial pericardial effusion is present. The pericardial effusion is posterior to the left ventricle. Mitral Valve: 2D MVA 2.10 cm. The mitral valve is grossly normal. Trivial mitral valve regurgitation. Tricuspid Valve: The tricuspid valve is grossly normal. Tricuspid valve regurgitation is not demonstrated. No evidence of tricuspid stenosis. Aortic Valve: The aortic valve is tricuspid. Aortic valve regurgitation is not visualized. No aortic stenosis is present. Pulmonic Valve: The pulmonic valve was not well visualized. Pulmonic valve regurgitation is not visualized. No evidence of pulmonic stenosis. Aorta: The aortic root and ascending aorta are structurally normal, with no evidence of dilitation. Venous: The inferior vena cava is normal in size with greater than 50% respiratory variability, suggesting right atrial pressure of 3 mmHg. IAS/Shunts: No atrial level shunt detected by color flow Doppler.  LEFT VENTRICLE PLAX 2D LVIDd:         4.70 cm     Diastology LVIDs:         4.10 cm     LV e' medial:  6.53 cm/s LV PW:         0.90 cm     LV e' lateral: 9.46 cm/s LV IVS:        0.90 cm LVOT diam:     1.70 cm     2D Longitudinal Strain LV SV:         47          2D Strain GLS Avg:     -13.0 % LV SV Index:   33 LVOT Area:     2.27 cm  LV Volumes (MOD) LV vol d, MOD A4C: 68.0 ml LV vol s, MOD A4C: 44.2 ml LV SV MOD A4C:     68.0 ml RIGHT VENTRICLE              IVC RV Basal diam:  2.30 cm     IVC diam: 1.20 cm RV S prime:     10.80 cm/s TAPSE (M-mode): 1.9 cm LEFT ATRIUM             Index        RIGHT ATRIUM          Index LA diam:        3.80 cm 2.64 cm/m   RA Area:     9.10 cm LA Vol (A2C):   38.6 ml 26.78 ml/m  RA Volume:   17.70 ml 12.28 ml/m LA Vol (A4C):   28.0 ml 19.42 ml/m LA Biplane Vol: 33.6 ml 23.31 ml/m  AORTIC VALVE LVOT Vmax:   102.00 cm/s LVOT Vmean:  63.400 cm/s LVOT VTI:    0.209 m  AORTA Ao Root diam: 2.60 cm  Ao Asc diam:  2.50 cm MITRAL VALVE                TRICUSPID VALVE MV Area (PHT): 3.03 cm     TR Peak grad:   14.0 mmHg MV Decel Time: 250 msec     TR Vmax:        187.00 cm/s MV A velocity: 132.00 cm/s                             SHUNTS                             Systemic VTI:  0.21 m                             Systemic Diam: 1.70 cm Rudean Haskell MD Electronically signed by Rudean Haskell MD Signature Date/Time: 10/05/2022/6:19:20 PM    Final    CT Head Wo Contrast  Result Date: 10/05/2022 CLINICAL DATA:  Syncopal episode. EXAM: CT HEAD WITHOUT CONTRAST TECHNIQUE: Contiguous axial images were obtained from the base of the skull through the vertex without intravenous contrast. RADIATION DOSE REDUCTION: This exam was performed according to the departmental dose-optimization program which includes automated exposure control, adjustment of the mA and/or kV according to patient size and/or use of iterative reconstruction technique. COMPARISON:  12/21/2016 FINDINGS: Brain: No evidence of intracranial hemorrhage, acute infarction, hydrocephalus, extra-axial collection, or mass lesion/mass effect. Mild diffuse cerebral atrophy appears stable. Vascular:  No hyperdense vessel or other acute findings. Skull: No evidence of fracture or other significant bone abnormality. Sinuses/Orbits:  No acute findings. Other: None. IMPRESSION: No acute intracranial abnormality. Stable mild cerebral atrophy. Electronically Signed   By: Marlaine Hind M.D.   On: 10/05/2022 15:04   DG Chest 2 View  Result Date: 10/05/2022 CLINICAL DATA:  Chest pain EXAM: CHEST - 2 VIEW COMPARISON:  Chest x-rays dated 06/21/2021 and 05/15/2019 FINDINGS: Cardiomegaly, grossly stable. Lungs are clear. No pleural effusion or pneumothorax is seen. No acute-appearing osseous abnormality. IMPRESSION: No active cardiopulmonary disease. No evidence of pneumonia or pulmonary edema. Cardiomegaly. Electronically Signed   By: Franki Cabot M.D.   On: 10/05/2022 14:38    Cardiac Studies   LHC 10/06/22: IMPRESSION: Ms. Colar has a widely patent RCA stent and otherwise no significant CAD.  LVEDP was 15.  I do not think that her chest pain was ischemically mediated.  The sheath will be removed and pressure held in the groin to achieve hemostasis.  The right radial sheath was removed and a TR band was placed to achieve hemostasis.  The patient left lab in stable condition.  Dr. Johney Frame, the patient's attending cardiologist, was made aware of these results.  I anticipate discharge later today.    Echo 10/05/22:  1. Left ventricular ejection fraction, by estimation, is 45%. The left  ventricle has mildly decreased function. The left ventricle demonstrates  regional wall motion abnormalities (see scoring diagram/findings for  description). Left ventricular diastolic   parameters are consistent with Grade I diastolic dysfunction (impaired  relaxation). The average left ventricular global longitudinal strain is  -13.0 %. The global longitudinal strain is abnormal.   2. Abnormal inferior and inferolateral regional strain.   3. Right ventricular systolic function is normal. The right ventricular  size is normal. There is normal pulmonary artery systolic pressure. The  estimated right ventricular systolic pressure is 123XX123 mmHg.   4. The mitral valve is grossly normal. Trivial mitral valve  regurgitation.   5. The aortic valve is tricuspid. Aortic valve regurgitation is  not  visualized. No aortic stenosis is present.   6. The inferior vena cava is normal in size with greater than 50%  respiratory variability, suggesting right atrial pressure of 3 mmHg.   Patient Profile     77 y.o. female  with a hx of CAD and known coronary spasm with recurrent chest pain and prior syncope who is being seen for CP and syncope.    Assessment & Plan    Chest pain CAD Heart catheterization with patent RCA stent and otherwise nonobstructive disease in the proximal LAD 30%, ostial D1 40%, and proximal to mid RCA 25%. Chest pain felt noncardiac.  Continue 2.5 mg amlodipine and 15 mg imdur  Mild cardiomyopathy Known LVEF 45% GDMT difficult given BP Does not appear volume up on exam, but she sleeps with [facility] elevated/pillows chronically.  Syncope Unclear etiology but strong suspicion for vasavogal event - became hot and nauseous just before LOC while singing with the church choir.  This has happened to her before, but has been "years" Did have prodrome while standing, telemetry unrevealing, will obtain 14 day zio Echo unrevealing, carotid ultrasound with mild disease bilaterally - may not drive for 6 months   I have arranged cardiology follow up. No further workup from cards.       For questions or updates, please contact [place] Please consult [email or web address] for contact info under        Signed, Ledora Bottcher, PA  10/07/2022, 8:05 AM    Patient seen and examined and agree with Fabian Sharp, PA as detailed above.   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   Patient presented after having an episode of syncope. Specifically, she was standing in church about to sing when she felt very unwell and then  passed out. Had missed breakfast that morning. When she woke up, she was complaining of sharp chest pain. In the ER, trop negative. ECG  with NSR with anterolateral TWI that appear worse than prior. Repeat TTE with stable LVEF 45% with lateral wall akinesis (chronic). Cath this admission with widely patent RCA stent with no obstructive disease.   Overall, suspect chest pain is noncardiac in nature. Will continue current medications. Okay to discharge home today from a CV perspective.   GEN: No acute distress.   Neck: No JVD Cardiac: RRR, no murmurs. Right radial access site c/d/i  Respiratory: Clear to auscultation bilaterally. GI: Soft, nontender, non-distended  MS: No edema; No deformity. Neuro:  Nonfocal  Psych: Normal affect     Plan: -Okay to discharge home from a CV perspective -Continue amlodipine 2.'5mg'$  daily -Continue imdur '15mg'$  daily -Continue ASA, crestor '5mg'$  daily -Will plan for 2 week zio monitor on discharge for work-up of syncope although strongly suspect this was vagal in nature  Cardiology will sign-off. Will arrange for CV follow-up.     Gwyndolyn Kaufman, MD

## 2022-10-07 NOTE — Discharge Summary (Signed)
Physician Discharge Summary   Shahadah Pelowski [id number] DOB: March 12, 1946 DOA: 10/05/2022  PCP: [name], MD  Admit date: 10/05/2022 Discharge date: 10/07/2022   Admitted From: Home Disposition:  Home Discharging physician: Dwyane Dee, MD Barriers to discharge:   Recommendations for Outpatient Follow-up:  Zio patch being arranged per cardiology at discharge   Discharge Condition: stable CODE STATUS: Full Diet recommendation:  Diet Orders (From admission, onward)     Start     Ordered   10/07/22 0000  Diet - low sodium heart healthy        10/07/22 1202   10/06/22 1631  Diet regular Room service appropriate? Yes; Fluid consistency: Thin  Diet effective now       Question Answer Comment  Room service appropriate? Yes   Fluid consistency: Thin      10/06/22 1630            Hospital Course:  Noncardiac chest pain CAD -Evaluated by cardiology during hospitalization - Underwent LHC during hospitalization as well.  Patent RCA stent and otherwise nonobstructive disease noted -Patient started on Imdur at discharge per cardiology recommendations - Amlodipine continued.  Patient instructed to monitor blood pressure at home given soft blood pressures already at discharge   Syncope -suspected vasovagal -Orthostatics performed prior to discharge and are negative -Follow-up Zio patch results as well   Chronic combined HFrEF and HFpEF -Euvolemic, not on diuresis.  not on beta-blocker prophy for her chronic sinus bradycardia   IIDM - continue home regimen    The patient's chronic medical conditions were treated accordingly per the patient's home medication regimen except as noted.  On day of discharge, patient was felt deemed stable for discharge. Patient/family member advised to call PCP or come back to ER if needed.   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     Discharge Instructions     Diet - low sodium heart healthy   Complete by: As directed    Increase activity slowly   Complete by: As directed       Allergies as of 10/07/2022   No Known Allergies      Medication List     TAKE these medications    Accu-Chek Guide test strip Generic drug: glucose blood USE TO CHECK BLOOD SUGAR DAILY   Accu-Chek Softclix Lancets lancets daily.   acetaminophen 500 MG tablet Commonly known as: TYLENOL Take 500 mg by mouth every 6 (six) hours as needed for headache (pain).   amLODipine 2.5 MG tablet Commonly known as: NORVASC Take 1 tablet (2.5 mg total) by mouth daily.   aspirin EC 325 MG tablet Take 1 tablet (325 mg total) by mouth daily.   dapagliflozin propanediol 10 MG Tabs tablet Commonly known as: Farxiga Take 1 tablet (10 mg total) by mouth daily before breakfast.   isosorbide mononitrate 30 MG 24 hr tablet Commonly known as: IMDUR Take 0.5 tablets (15 mg total) by mouth daily.   metFORMIN 500 MG 24 hr tablet Commonly known as: GLUCOPHAGE-XR Take 500 mg by mouth 2 (two) times daily.   mirtazapine 15 MG tablet Commonly known as: REMERON Take 15 mg by mouth at bedtime.   nitroGLYCERIN  0.4 MG SL tablet Commonly known as: NITROSTAT PLACE 1 TABLET UNDER TONGUE AS NEEDED FOR CHEST PAIN EVERY 5 MINUTES UP TO 3 DOSES THEN SEEK MEDICAL ATTENTION. Please make overdue appt. 1st attempt   pantoprazole 40 MG tablet Commonly known as: PROTONIX Take 1 tablet (40 mg total) by mouth 2 (two) times daily.   rosuvastatin 5 MG tablet Commonly known as: CRESTOR Take 1 tablet (5 mg total) by mouth daily at 6 PM.   traZODone 50 MG tablet Commonly known as: DESYREL Take 50-100 mg by mouth at bedtime.  1 tablet at bedtime, if not working will take second tablet        No Known Allergies  Consultations: Cardiology  Procedures: 10/06/22: [place]  Discharge Exam: BP 98/62   Pulse (!) 56   Temp 97.9 F (36.6 C) (Oral)   Resp 15   Ht '4\' 11"'$  (1.499 m)   Wt 53.8 kg   SpO2 97%   BMI 23.97 kg/m  Physical Exam Constitutional:      Appearance: She is well-developed.  HENT:     Head: Normocephalic and atraumatic.     Mouth/Throat:     Mouth: Mucous membranes are moist.  Eyes:     Extraocular Movements: Extraocular movements intact.  Cardiovascular:     Rate and Rhythm: Normal rate and regular rhythm.  Pulmonary:     Effort: Pulmonary effort is normal. No respiratory distress.     Breath sounds: Normal breath sounds. No wheezing.  Abdominal:     General: Bowel sounds are normal. There is no distension.     Palpations: Abdomen is soft.     Tenderness: There is no abdominal tenderness.  Musculoskeletal:        General: Normal range of motion.     Cervical back: Normal range of motion.  Skin:    General: Skin is warm and dry.  Neurological:     General: No focal deficit present.     Mental Status: She is alert.  Psychiatric:        Mood and Affect: Mood normal.      The results of significant diagnostics from this hospitalization (including imaging, microbiology, ancillary and laboratory) are listed below for reference.   Microbiology: Recent Results (from the past 240 hour(s))  Resp panel by RT-PCR (RSV, Flu A&B, Covid) Anterior Nasal Swab     Status: None   Collection Time: 10/05/22  1:56 PM   Specimen: Anterior Nasal Swab  Result Value Ref Range Status   SARS Coronavirus 2 by RT PCR NEGATIVE NEGATIVE Final   Influenza A by PCR NEGATIVE NEGATIVE Final   Influenza B by PCR NEGATIVE NEGATIVE Final    Comment: (NOTE) The Xpert Xpress SARS-CoV-2/FLU/RSV plus assay is intended as an aid in the diagnosis of influenza from Nasopharyngeal swab specimens and should not be  used as a sole basis for treatment. Nasal washings and aspirates are unacceptable for Xpert Xpress SARS-CoV-2/FLU/RSV testing.  Fact Sheet for Patients: EntrepreneurPulse.com.au  Fact Sheet for Healthcare Providers: IncredibleEmployment.be  This test is not yet approved or cleared by the [place] FDA and has been authorized for detection and/or diagnosis of SARS-CoV-2 by FDA under an Emergency Use Authorization (EUA). This EUA will remain in effect (meaning this test can be used) for the duration of the COVID-19 declaration under Section 564(b)(1) of the Act, 21 U.S.C. section 360bbb-3(b)(1), unless the authorization is terminated or revoked.     Resp Syncytial Virus by PCR NEGATIVE NEGATIVE Final  Comment: (NOTE) Fact Sheet for Patients: EntrepreneurPulse.com.au  Fact Sheet for Healthcare Providers: IncredibleEmployment.be  This test is not yet approved or cleared by the [place] FDA and has been authorized for detection and/or diagnosis of SARS-CoV-2 by FDA under an Emergency Use Authorization (EUA). This EUA will remain in effect (meaning this test can be used) for the duration of the COVID-19 declaration under Section 564(b)(1) of the Act, 21 U.S.C. section 360bbb-3(b)(1), unless the authorization is terminated or revoked.  Performed at [facility], [place] [address], [place], [place] [address]      Labs: BNP (last 3 results) No results for input(s): "BNP" in the last 8760 hours. Basic Metabolic Panel: Recent Labs  Lab 10/05/22 1332 10/05/22 1603 10/07/22 0158  NA 140  --  139  K 4.0  --  4.1  CL 106  --  105  CO2 23  --  25  GLUCOSE 114*  --  115*  BUN 11  --  18  CREATININE 0.68  --  0.81  CALCIUM 9.3  --  8.9  MG  --  1.9  --    Liver Function Tests: No results for input(s): "AST", "ALT", "ALKPHOS", "BILITOT", "PROT", "ALBUMIN" in the last 168 hours. No results for  input(s): "LIPASE", "AMYLASE" in the last 168 hours. No results for input(s): "AMMONIA" in the last 168 hours. CBC: Recent Labs  Lab 10/05/22 1332  WBC 6.8  NEUTROABS 4.7  HGB 14.1  HCT 42.3  MCV 92.0  PLT 256   Cardiac Enzymes: No results for input(s): "CKTOTAL", "CKMB", "CKMBINDEX", "TROPONINI" in the last 168 hours. BNP: Invalid input(s): "POCBNP" CBG: Recent Labs  Lab 10/06/22 1320 10/06/22 1629 10/06/22 2120 10/07/22 0018 10/07/22 0817  GLUCAP 102* 96 138* 117* 96   D-Dimer No results for input(s): "DDIMER" in the last 72 hours. Hgb A1c Recent Labs    10/05/22 1332  HGBA1C 6.6*   Lipid Profile No results for input(s): "CHOL", "HDL", "LDLCALC", "TRIG", "CHOLHDL", "LDLDIRECT" in the last 72 hours. Thyroid function studies No results for input(s): "TSH", "T4TOTAL", "T3FREE", "THYROIDAB" in the last 72 hours.  Invalid input(s): "FREET3" Anemia work up No results for input(s): "VITAMINB12", "FOLATE", "FERRITIN", "TIBC", "IRON", "RETICCTPCT" in the last 72 hours. Urinalysis    Component Value Date/Time   COLORURINE YELLOW 10/29/2020 1357   APPEARANCEUR CLEAR 10/29/2020 1357   LABSPEC >1.046 (H) 10/29/2020 1357   PHURINE 5.0 10/29/2020 1357   GLUCOSEU NEGATIVE 10/29/2020 1357   HGBUR NEGATIVE 10/29/2020 1357   [place] 10/29/2020 1357   KETONESUR NEGATIVE 10/29/2020 1357   PROTEINUR NEGATIVE 10/29/2020 1357   UROBILINOGEN 1.0 03/09/2013 1215   NITRITE NEGATIVE 10/29/2020 1357   LEUKOCYTESUR MODERATE (A) 10/29/2020 1357   Sepsis Labs Recent Labs  Lab 10/05/22 1332  WBC 6.8   Microbiology Recent Results (from the past 240 hour(s))  Resp panel by RT-PCR (RSV, Flu A&B, Covid) Anterior Nasal Swab     Status: None   Collection Time: 10/05/22  1:56 PM   Specimen: Anterior Nasal Swab  Result Value Ref Range Status   SARS Coronavirus 2 by RT PCR NEGATIVE NEGATIVE Final   Influenza A by PCR NEGATIVE NEGATIVE Final   Influenza B by PCR NEGATIVE  NEGATIVE Final    Comment: (NOTE) The Xpert Xpress SARS-CoV-2/FLU/RSV plus assay is intended as an aid in the diagnosis of influenza from Nasopharyngeal swab specimens and should not be used as a sole basis for treatment. Nasal washings and aspirates are unacceptable  for Xpert Xpress SARS-CoV-2/FLU/RSV testing.  Fact Sheet for Patients: EntrepreneurPulse.com.au  Fact Sheet for Healthcare Providers: IncredibleEmployment.be  This test is not yet approved or cleared by the [place] FDA and has been authorized for detection and/or diagnosis of SARS-CoV-2 by FDA under an Emergency Use Authorization (EUA). This EUA will remain in effect (meaning this test can be used) for the duration of the COVID-19 declaration under Section 564(b)(1) of the Act, 21 U.S.C. section 360bbb-3(b)(1), unless the authorization is terminated or revoked.     Resp Syncytial Virus by PCR NEGATIVE NEGATIVE Final    Comment: (NOTE) Fact Sheet for Patients: EntrepreneurPulse.com.au  Fact Sheet for Healthcare Providers: IncredibleEmployment.be  This test is not yet approved or cleared by the [place] FDA and has been authorized for detection and/or diagnosis of SARS-CoV-2 by FDA under an Emergency Use Authorization (EUA). This EUA will remain in effect (meaning this test can be used) for the duration of the COVID-19 declaration under Section 564(b)(1) of the Act, 21 U.S.C. section 360bbb-3(b)(1), unless the authorization is terminated or revoked.  Performed at [facility], [place] [address] [place],  [address]     Procedures/Studies: VAS US CAROTID  Result Date: 10/06/2022 Carotid Arterial Duplex Study Patient Name:  DARIUS GABORIAULT  Date of Exam:   10/06/2022 Medical Rec #: [id number]        Accession #:    [id number] Date of Birth: October 06, 1945        Patient Gender: F Patient Age:   77 years Exam Location:  [facility] Procedure:      VAS US CAROTID Referring Phys: Wynetta Fines --------------------------------------------------------------------------------  Indications:       Carotid artery disease and Syncope. Risk Factors:      Hypertension, hyperlipidemia, Diabetes, current smoker. Comparison Study:  No prior study. Performing Technologist: McKayla Maag RVT, [place]  Examination Guidelines: A complete evaluation includes B-mode imaging, spectral Doppler, color Doppler, and power Doppler as needed of all accessible portions of each vessel. Bilateral testing is considered an integral part of a complete examination. Limited examinations for reoccurring indications may be performed as noted.  Right Carotid Findings: +----------+--------+--------+--------+---------------------+------------------+           PSV cm/sEDV cm/sStenosisPlaque Description   Comments           +----------+--------+--------+--------+---------------------+------------------+ CCA Prox  92      14                                                      +----------+--------+--------+--------+---------------------+------------------+ CCA Distal71      11                                   intimal thickening +----------+--------+--------+--------+---------------------+------------------+ ICA Prox  52      12      1-39%   smooth and                                                                homogeneous                             +----------+--------+--------+--------+---------------------+------------------+  ICA Mid   65      14                                                      +----------+--------+--------+--------+---------------------+------------------+ ICA Distal110     30                                                      +----------+--------+--------+--------+---------------------+------------------+ ECA       61      11                                   intimal thickening  +----------+--------+--------+--------+---------------------+------------------+ +----------+--------+-------+----------------+-------------------+           PSV cm/sEDV cmsDescribe        Arm Pressure (mmHG) +----------+--------+-------+----------------+-------------------+ [id number]            Multiphasic, WNL                    +----------+--------+-------+----------------+-------------------+ +---------+--------+--+--------+--+---------+ VertebralPSV cm/s44EDV cm/s12Antegrade +---------+--------+--+--------+--+---------+  Left Carotid Findings: +----------+--------+--------+--------+------------------+------------------+           PSV cm/sEDV cm/sStenosisPlaque DescriptionComments           +----------+--------+--------+--------+------------------+------------------+ CCA Prox  159     25                                                   +----------+--------+--------+--------+------------------+------------------+ CCA Distal58      17                                intimal thickening +----------+--------+--------+--------+------------------+------------------+ ICA Prox  82      24      1-39%                     intimal thickening +----------+--------+--------+--------+------------------+------------------+ ICA Mid   76      21                                                   +----------+--------+--------+--------+------------------+------------------+ ICA Distal88      17                                                   +----------+--------+--------+--------+------------------+------------------+ ECA       89      10                                intimal thickening +----------+--------+--------+--------+------------------+------------------+ +----------+--------+--------+----------------+-------------------+           PSV cm/sEDV cm/sDescribe  Arm Pressure (mmHG)  +----------+--------+--------+----------------+-------------------+ Subclavian115             Multiphasic, WNL                    +----------+--------+--------+----------------+-------------------+ +---------+--------+--+--------+--+---------+ VertebralPSV cm/s64EDV cm/s13Antegrade +---------+--------+--+--------+--+---------+   Summary: Right Carotid: Velocities in the right ICA are consistent with a 1-39% stenosis. Left Carotid: Velocities in the left ICA are consistent with a 1-39% stenosis. Vertebrals:  Bilateral vertebral arteries demonstrate antegrade flow. Subclavians: Normal flow hemodynamics were seen in bilateral subclavian              arteries. *See table(s) above for measurements and observations.  Electronically signed by Servando Snare MD on 10/06/2022 at 5:16:18 PM.    Final    CARDIAC CATHETERIZATION  Result Date: 10/06/2022 Images from the original result were not included.   Prox LAD lesion is 30% stenosed.   Ost 1st Diag lesion is 40% stenosed.   Prox RCA to Mid RCA lesion is 25% stenosed. Elaisha Beeson is a 77 y.o. female  [id number] LOCATION:  FACILITY: [facility] PHYSICIAN: Quay Burow, M.D. 1946/02/23 DATE OF PROCEDURE:  10/06/2022 DATE OF DISCHARGE: CARDIAC CATHETERIZATION History obtained from chart review.77 y.o. female with a hx of CAD and known coronary spasm with recurrent chest pain and prior syncope who is being seen for CP and syncope.   Her troponins were negative.  Her EKG did show lateral T wave inversion.  Because of this, Dr. Johney Frame felt that it was prudent to pursue coronary angiography to rule out an ischemic etiology.   Ms. Despain has a widely patent RCA stent and otherwise no significant CAD.  LVEDP was 15.  I do not think that her chest pain was ischemically mediated.  The sheath will be removed and pressure held in the groin to achieve hemostasis.  The right radial sheath was removed and a TR band was placed to achieve hemostasis.  The patient left lab  in stable condition.  Dr. Johney Frame, the patient's attending cardiologist, was made aware of these results.  I anticipate discharge later today. Quay Burow. MD, [facility] 10/06/2022 1:05 PM    ECHOCARDIOGRAM COMPLETE  Result Date: 10/05/2022    ECHOCARDIOGRAM REPORT   Patient Name:   DEMESHIA YOKE Date of Exam: 10/05/2022 Medical Rec #:  [id number]       Height:       59.0 in Accession #:    [id number]      Weight:       112.0 lb Date of Birth:  1946/04/23       BSA:          1.442 m Patient Age:    38 years        BP:           109/61 mmHg Patient Gender: F               HR:           65 bpm. Exam Location:  Inpatient Procedure: 2D Echo Indications:    chest pain  History:        Patient has prior history of Echocardiogram examinations, most                 recent 08/29/2021. CAD, Signs/Symptoms:Syncope; Risk                 Factors:Current Smoker and Hypertension.  Sonographer:    Johny Chess RDCS Referring Phys: [id number] Lequita Halt  Sonographer Comments: Global  longitudinal strain was attempted. IMPRESSIONS  1. Left ventricular ejection fraction, by estimation, is 45%. The left ventricle has mildly decreased function. The left ventricle demonstrates regional wall motion abnormalities (see scoring diagram/findings for description). Left ventricular diastolic  parameters are consistent with Grade I diastolic dysfunction (impaired relaxation). The average left ventricular global longitudinal strain is -13.0 %. The global longitudinal strain is abnormal.  2. Abnormal inferior and inferolateral regional strain.  3. Right ventricular systolic function is normal. The right ventricular size is normal. There is normal pulmonary artery systolic pressure. The estimated right ventricular systolic pressure is 123XX123 mmHg.  4. The mitral valve is grossly normal. Trivial mitral valve regurgitation.  5. The aortic valve is tricuspid. Aortic valve regurgitation is not visualized. No aortic stenosis is present.  6. The  inferior vena cava is normal in size with greater than 50% respiratory variability, suggesting right atrial pressure of 3 mmHg. FINDINGS  Left Ventricle: Left ventricular ejection fraction, by estimation, is 45%. The left ventricle has mildly decreased function. The left ventricle demonstrates regional wall motion abnormalities. The average left ventricular global longitudinal strain is -13.0 %. The global longitudinal strain is abnormal. The left ventricular internal cavity size was normal in size. There is no left ventricular hypertrophy. Left ventricular diastolic parameters are consistent with Grade I diastolic dysfunction (impaired  relaxation).  LV Wall Scoring: The entire lateral wall is akinetic. Right Ventricle: The right ventricular size is normal. No increase in right ventricular wall thickness. Right ventricular systolic function is normal. There is normal pulmonary artery systolic pressure. The tricuspid regurgitant velocity is 1.87 m/s, and  with an assumed right atrial pressure of 3 mmHg, the estimated right ventricular systolic pressure is 123XX123 mmHg. Left Atrium: Left atrial size was normal in size. Right Atrium: Right atrial size was normal in size. Pericardium: Trivial pericardial effusion is present. The pericardial effusion is posterior to the left ventricle. Mitral Valve: 2D MVA 2.10 cm. The mitral valve is grossly normal. Trivial mitral valve regurgitation. Tricuspid Valve: The tricuspid valve is grossly normal. Tricuspid valve regurgitation is not demonstrated. No evidence of tricuspid stenosis. Aortic Valve: The aortic valve is tricuspid. Aortic valve regurgitation is not visualized. No aortic stenosis is present. Pulmonic Valve: The pulmonic valve was not well visualized. Pulmonic valve regurgitation is not visualized. No evidence of pulmonic stenosis. Aorta: The aortic root and ascending aorta are structurally normal, with no evidence of dilitation. Venous: The inferior vena cava is normal  in size with greater than 50% respiratory variability, suggesting right atrial pressure of 3 mmHg. IAS/Shunts: No atrial level shunt detected by color flow Doppler.  LEFT VENTRICLE PLAX 2D LVIDd:         4.70 cm     Diastology LVIDs:         4.10 cm     LV e' medial:  6.53 cm/s LV PW:         0.90 cm     LV e' lateral: 9.46 cm/s LV IVS:        0.90 cm LVOT diam:     1.70 cm     2D Longitudinal Strain LV SV:         47          2D Strain GLS Avg:     -13.0 % LV SV Index:   33 LVOT Area:     2.27 cm  LV Volumes (MOD) LV vol d, MOD A4C: 68.0 ml LV vol s, MOD A4C: 44.2 ml  LV SV MOD A4C:     68.0 ml RIGHT VENTRICLE             IVC RV Basal diam:  2.30 cm     IVC diam: 1.20 cm RV S prime:     10.80 cm/s TAPSE (M-mode): 1.9 cm LEFT ATRIUM             Index        RIGHT ATRIUM          Index LA diam:        3.80 cm 2.64 cm/m   RA Area:     9.10 cm LA Vol (A2C):   38.6 ml 26.78 ml/m  RA Volume:   17.70 ml 12.28 ml/m LA Vol (A4C):   28.0 ml 19.42 ml/m LA Biplane Vol: 33.6 ml 23.31 ml/m  AORTIC VALVE LVOT Vmax:   102.00 cm/s LVOT Vmean:  63.400 cm/s LVOT VTI:    0.209 m  AORTA Ao Root diam: 2.60 cm Ao Asc diam:  2.50 cm MITRAL VALVE                TRICUSPID VALVE MV Area (PHT): 3.03 cm     TR Peak grad:   14.0 mmHg MV Decel Time: 250 msec     TR Vmax:        187.00 cm/s MV A velocity: 132.00 cm/s                             SHUNTS                             Systemic VTI:  0.21 m                             Systemic Diam: 1.70 cm Rudean Haskell MD Electronically signed by Rudean Haskell MD Signature Date/Time: 10/05/2022/6:19:20 PM    Final    CT Head Wo Contrast  Result Date: 10/05/2022 CLINICAL DATA:  Syncopal episode. EXAM: CT HEAD WITHOUT CONTRAST TECHNIQUE: Contiguous axial images were obtained from the base of the skull through the vertex without intravenous contrast. RADIATION DOSE REDUCTION: This exam was performed according to the departmental dose-optimization program which includes automated  exposure control, adjustment of the mA and/or kV according to patient size and/or use of iterative reconstruction technique. COMPARISON:  12/21/2016 FINDINGS: Brain: No evidence of intracranial hemorrhage, acute infarction, hydrocephalus, extra-axial collection, or mass lesion/mass effect. Mild diffuse cerebral atrophy appears stable. Vascular:  No hyperdense vessel or other acute findings. Skull: No evidence of fracture or other significant bone abnormality. Sinuses/Orbits:  No acute findings. Other: None. IMPRESSION: No acute intracranial abnormality. Stable mild cerebral atrophy. Electronically Signed   By: Marlaine Hind M.D.   On: 10/05/2022 15:04   DG Chest 2 View  Result Date: 10/05/2022 CLINICAL DATA:  Chest pain EXAM: CHEST - 2 VIEW COMPARISON:  Chest x-rays dated 06/21/2021 and 05/15/2019 FINDINGS: Cardiomegaly, grossly stable. Lungs are clear. No pleural effusion or pneumothorax is seen. No acute-appearing osseous abnormality. IMPRESSION: No active cardiopulmonary disease. No evidence of pneumonia or pulmonary edema. Cardiomegaly. Electronically Signed   By: Franki Cabot M.D.   On: 10/05/2022 14:38     Time coordinating discharge: Over 30 minutes    Dwyane Dee, MD  [facility] 10/07/2022, 4:55 PM

## 2022-10-08 ENCOUNTER — Ambulatory Visit (HOSPITAL_BASED_OUTPATIENT_CLINIC_OR_DEPARTMENT_OTHER): Payer: Medicare Other | Admitting: Physical Therapy

## 2022-10-09 DIAGNOSIS — E118 Type 2 diabetes mellitus with unspecified complications: Secondary | ICD-10-CM | POA: Diagnosis not present

## 2022-10-09 LAB — LIPOPROTEIN A (LPA): Lipoprotein (a): 115.4 nmol/L — ABNORMAL HIGH (ref <75.0)

## 2022-10-10 ENCOUNTER — Encounter (HOSPITAL_BASED_OUTPATIENT_CLINIC_OR_DEPARTMENT_OTHER): Payer: Medicare Other | Admitting: Physical Therapy

## 2022-10-10 DIAGNOSIS — K5904 Chronic idiopathic constipation: Secondary | ICD-10-CM | POA: Diagnosis not present

## 2022-10-10 DIAGNOSIS — M5090 Cervical disc disorder, unspecified, unspecified cervical region: Secondary | ICD-10-CM | POA: Diagnosis not present

## 2022-10-10 DIAGNOSIS — I10 Essential (primary) hypertension: Secondary | ICD-10-CM | POA: Diagnosis not present

## 2022-10-10 DIAGNOSIS — R55 Syncope and collapse: Secondary | ICD-10-CM | POA: Diagnosis not present

## 2022-10-10 DIAGNOSIS — E782 Mixed hyperlipidemia: Secondary | ICD-10-CM | POA: Diagnosis not present

## 2022-10-10 DIAGNOSIS — E1151 Type 2 diabetes mellitus with diabetic peripheral angiopathy without gangrene: Secondary | ICD-10-CM | POA: Diagnosis not present

## 2022-10-10 DIAGNOSIS — I25111 Atherosclerotic heart disease of native coronary artery with angina pectoris with documented spasm: Secondary | ICD-10-CM | POA: Diagnosis not present

## 2022-10-10 DIAGNOSIS — I252 Old myocardial infarction: Secondary | ICD-10-CM | POA: Diagnosis not present

## 2022-10-10 DIAGNOSIS — I739 Peripheral vascular disease, unspecified: Secondary | ICD-10-CM | POA: Diagnosis not present

## 2022-10-15 ENCOUNTER — Encounter (HOSPITAL_BASED_OUTPATIENT_CLINIC_OR_DEPARTMENT_OTHER): Payer: Self-pay | Admitting: Physical Therapy

## 2022-10-15 ENCOUNTER — Ambulatory Visit (HOSPITAL_BASED_OUTPATIENT_CLINIC_OR_DEPARTMENT_OTHER): Payer: Medicare Other | Attending: Orthopaedic Surgery | Admitting: Physical Therapy

## 2022-10-15 DIAGNOSIS — M25511 Pain in right shoulder: Secondary | ICD-10-CM | POA: Diagnosis not present

## 2022-10-15 DIAGNOSIS — M25611 Stiffness of right shoulder, not elsewhere classified: Secondary | ICD-10-CM | POA: Insufficient documentation

## 2022-10-15 DIAGNOSIS — M6281 Muscle weakness (generalized): Secondary | ICD-10-CM | POA: Insufficient documentation

## 2022-10-15 NOTE — Therapy (Signed)
OUTPATIENT PHYSICAL THERAPY SHOULDER TREATMENT     Patient Name: [name] MRN: [id number] DOB:[birth year], 77 y.o., female Today's Date: 10/16/2022   PT End of Session - 10/15/22 1026     Visit Number 43    Number of Visits 56    Date for PT Re-Evaluation 11/05/22    Authorization Type U[employer] MCR    PT Start Time 0940    PT Stop Time 1012    PT Time Calculation (min) 32 min    Activity Tolerance Patient tolerated treatment well    Behavior During Therapy WFL for tasks assessed/performed                             Past Medical History:  Diagnosis Date   Anemia    Anginal pain ([place])    last cp was last year   Anxiety    Arthritis    Blood transfusion    Chest pain 02/04/2016   Coronary artery disease    Coronary artery spasm, wth continued episodes of chest pain.  09/26/2013   Coronary vasospasm (H[facility])    Diabetes mellitus without complication (HCC)    type II   GERD (gastroesophageal reflux disease)    Headache    Hiatal hernia    Hypercholesteremia    Hypertension    NSTEMI (non-ST elevated myocardial infarction) ([place]) not sure   NSVT (nonsustained ventricular tachycardia) ([place]) 09/26/2013   Panic attack    Past Surgical History:  Procedure Laterality Date   ABDOMINAL HYSTERECTOMY     PARTIAL HYSTERECTOMY   ACDF N/A    C5-6 ACDF Dr. Sherwood Gambler   breast     right, tumor benign   BREAST EXCISIONAL BIOPSY Right    1961 (age 17) benign   CARDIAC CATHETERIZATION  2014   CARDIAC CATHETERIZATION  2012   CARDIAC CATHETERIZATION N/A 07/22/2016   Procedure: Left Heart Cath and Coronary Angiography;  Surgeon: Belva Crome, MD;  Location: [place] [facility];  Service: Cardiovascular;  Laterality: N/A;   cardiac stent  2012   to RCA   CHOLECYSTECTOMY N/A 10/02/2017   Procedure: LAPAROSCOPIC CHOLECYSTECTOMY;  Surgeon: Ralene Ok, MD;  Location: [place];  Service: General;  Laterality: N/A;   CORONARY ANGIOPLASTY     ESOPHAGEAL MANOMETRY N/A  10/29/2016   Procedure: ESOPHAGEAL MANOMETRY (EM);  Surgeon: Garlan Fair, MD;  [facility];  Service: Endoscopy;  Laterality: N/A;   ESOPHAGOGASTRODUODENOSCOPY (EGD) WITH PROPOFOL N/A 10/28/2016   Procedure: ESOPHAGOGASTRODUODENOSCOPY (EGD) WITH PROPOFOL;  Surgeon: Garlan Fair, MD;  [facility];  Service: Endoscopy;  Laterality: N/A;   EXCISION OF SKIN TAG N/A 07/29/2017   Procedure: EXCISION OF PERIANAL  SKIN TAG;  Surgeon: Ralene Ok, MD;  Location: [facility];  Service: General;  Laterality: N/A;   KNEE SURGERY  left   left ear surgery     for bad cut   LEFT HEART CATH AND CORONARY ANGIOGRAPHY N/A 10/06/2022   Procedure: LEFT HEART CATH AND CORONARY ANGIOGRAPHY;  Surgeon: Lorretta Harp, MD;  Location: [place];  Service: Cardiovascular;  Laterality: N/A;   LEFT HEART CATHETERIZATION WITH CORONARY ANGIOGRAM N/A 02/22/2013   Procedure: LEFT HEART CATHETERIZATION WITH CORONARY ANGIOGRAM;  Surgeon: [name], MD;  Location: [facility];  Service: Cardiovascular;  Laterality: N/A;   REVERSE SHOULDER ARTHROPLASTY Right 04/07/2022   Procedure: RIGHT SHOULDER REVERSE SHOULDER ARTHROPLASTY;  Surgeon: Vanetta Mulders, MD;  Location:  [facility] [facility];  Service: Orthopedics;  Laterality: Right;   SHOULDER SURGERY Bilateral    rotator cuff   TOE SURGERY Left    toe surgery   Patient Active Problem List   Diagnosis Date Noted   Syncope 10/07/2022   Rotator cuff arthropathy of right shoulder    Carpal tunnel syndrome, right upper limb 01/03/2022   Chest pain 06/21/2021   Variant angina ([place]) 01/24/2017   Precordial chest pain 01/23/2017   Headache 01/23/2017   S/P angioplasty with stent 01/23/2017   Coronary artery disease 11/24/2014   Syncope and collapse 10/22/2014   Hyperglycemia 04/20/2014   H/O hiatal hernia 04/19/2014   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    Coronary artery vasospasm ([place]) 09/26/2013   HTN (hypertension) 12/08/2011   GERD (gastroesophageal reflux  disease) 12/08/2011   Depression 12/08/2011   Dyslipidemia 12/08/2011     REFERRING PROVIDER: Vanetta Mulders MD  REFERRING DIAG: s/p Rt reverse TSA + biceps tenodesis, without subscap repair    THERAPY DIAG:  Acute pain of right shoulder  Stiffness of right shoulder, not elsewhere classified  Muscle weakness (generalized)  Rationale for Evaluation and Treatment Rehabilitation  ONSET DATE: DOS 04/07/22  SUBJECTIVE:                                                                                                                                                                                      SUBJECTIVE STATEMENT: Pt is 25 weeks and 2 days s/p R reverse TSA and biceps tenodesis.   Pt states she passed out in church the Sunday before last.  Pt went to the ER by ambulance and was admitted to the hospital.  Pt had an EKG, CT scan, echocardiogram, and a heart catheterization.  She states they didn't find any blockages and she didn't have a MI.  MD notes indicated dx was syncope.  Pt states her heart was having spasms which she has had before, but it's been a long time.  Pt is wearing heart monitor until 3/14.  Pt went to see her PCP on 10/09/21 and he was pleased with how she was doing.  Her BP was 122/76 on 3/1.   PAIN LEVELS:  0/10 Current and Best ; 4/10 Worst.  Worst pain is mainly at night.  She states she is not having pain, just stiffness in shoulder.  Pt reports she still has stiffness in hands and pain in wrist.   FUNCTIONAL IMPROVEMENTS:  "The arm is getting better".  Pt able to apply deodorant easier.  Pt was able to rake outside.  Reaching overhead. Lifting coffee cup.  Reaching behind her head.  Making bed FUNCTIONAL LIMITATIONS:  reaching behind back.  Vacuuming.  Writing.         PERTINENT HISTORY: H/o bil RCR R carpal tunnel release in July 2023. Hx of MI with stent placement and angioplasty DM type II   PRECAUTIONS: Shoulder, reverse TSA without subscap  repair  WEIGHT BEARING RESTRICTIONS Yes    FALLS:  Has patient fallen in last 6 months? No  LIVING ENVIRONMENT: Lives with: lives with their spouse No stairs  OCCUPATION: Not working  PLOF: Independent  PATIENT GOALS the use of my arm and hand back, gardening  OBJECTIVE:    TODAY'S TREATMENT:   Therapeutic Exercise: -Reviewed pt presentation, current function, and pain levels.  -Prior/Current:   R shoulder AROM:   Flex:  139/145 deg Scaption:  118/131 Abd:  86/89  ER:  41/48 IR:  58/58  Functional ER AROM:  R/L:  C7 / T5  -Pt received R shoulder PROM in flexion, abd, ER, and IR per protocol ranges w/n pt and tissue tolerance.  -Pt performed:            Pulleys in flex, scaption, and abd x 20 reps each  Stargazer stretch 3x15 sec  Functional IR reaching x 10   Standing wand IR x 10 reps        PATIENT EDUCATION: Education details:  objective findings, POC including the plan to decrease frequency.  Progress, HEP, and exercise form/rationale.   Person educated: Patient and Spouse Education method: Explanation, Demonstration, Tactile cues, Verbal cues, and Handouts Education comprehension: verbalized understanding, returned demonstration, verbal cues required, tactile cues required, and needs further education   HOME EXERCISE PROGRAM: [email or web address]    ASSESSMENT:  CLINICAL IMPRESSION: Pt had a recent episode of syncope and was admitted to the hospital.  Pt's cardiac status was checked out and pt denies any MI and no blockages.  She is still wearing a cardiac monitor.  PT limited exercises including intensity of exercises today due to recent health issue.  Pt is progressing well with pain, ROM, and functional usage of R UE.  She reports improved pain, performance of ADL's, and reaching.  She is still limited with reaching behind back.  PT assessed ROM today.  Pt is progressing with R shoulder AROM as evidenced by goniometric measurements.  Pt demonstrates improved  AROM t/o R shoulder except no change in IR and minimal improvement in abd.  Pt does have some pain with ER motion.  Pt has met all STG's and LTG's #1,2,3,4 and 7.  Pt partially met LTG #6.  She should benefit from cont skilled PT services to improve ROM and strength and to assist in restoring desired level of function.     OBJECTIVE IMPAIRMENTS decreased activity tolerance, decreased knowledge of condition, decreased ROM, decreased strength, increased muscle spasms, impaired flexibility, impaired UE functional use, improper body mechanics, postural dysfunction, and pain.   ACTIVITY LIMITATIONS carrying, lifting, sleeping, bed mobility, bathing, dressing, reach over head, and hygiene/grooming  PARTICIPATION LIMITATIONS: meal prep, cleaning, laundry, driving, shopping, and yard work  PERSONAL FACTORS Past/current experiences and see PMH  are also affecting patient's functional outcome.   REHAB POTENTIAL: Good  CLINICAL DECISION MAKING: Stable/uncomplicated  EVALUATION COMPLEXITY: Low   GOALS:  SHORT TERM GOALS: Target date: 05/09/2022   AROM flexion to 90 without incr pain and good scap control Baseline:see chart Goal status: GOAL MET  2.  ER in scapular plane to 30 deg Baseline: see chart Goal status: GOAL MET  3.  Pain levels <=3/10 Baseline: see subj reports Goal status:  GOAL MET  4.  D/c sling Baseline: as tolerated at 3 wk Goal status: GOAL MET   LONG TERM GOALS: Target date: 08/20/2022     1.  Pt will meet FOTO goal Baseline: see above Goal status:  GOAL MET Target date: 06/20/22  2.  AROM flexion to 130 without incr pain and good scap control Baseline:  Goal status:  GOAL MET Target date: 09/18/2022  3.  Use of arm for ADLs pain <=3/10 Baseline:  Goal status: GOAL MET Target date: 05/23/2022   4.  Initiate functional IR behind back without incr pain Baseline: can begin at 6 weeks per post op protocol Goal status: GOAL MET Target date: 05/23/2022   5.   Pt will demo R shoulder AROM to be [facility] t/o for performance of ADLs and IADLs.  Goal status: PROGRESSING Target date:  11/05/2022  6.  Pt will be able to perform her ADLs and IADLs including reaching activities without significant pain and difficulty.  Goal status: PARTIALLY MET Target date:  11/05/2022   7.  Pt will be able to reach into an overhead cabinet without significant difficulty.  Goal status: GOAL MET Target date: 08/01/22  8.  Pt will demo improved R shoulder strength to at least 4/5 and ER 4-/5 w/n available range for improved tolerance with functional lifting/carrying.   Goal status: not assessed   Target date:  11/05/2022     PLAN: PT FREQUENCY: 1x/wk  PT DURATION: 3 weeks  PLANNED INTERVENTIONS: Therapeutic exercises, Therapeutic activity, Neuromuscular re-education, Balance training, Gait training, Patient/Family education, Self Care, Joint mobilization, Aquatic Therapy, Dry Needling, Electrical stimulation, Spinal mobilization, Cryotherapy, Moist heat, scar mobilization, Taping, Vasopneumatic device, Manual therapy, and Re-evaluation  PLAN FOR NEXT SESSION: continue per Dr. Eddie Dibbles reverse TSA protocol.  Continue to work on strengthening and stability.  Work on ER ROM.     Selinda Michaels III PT, DPT 10/16/22 3:11 PM

## 2022-10-17 ENCOUNTER — Encounter (HOSPITAL_BASED_OUTPATIENT_CLINIC_OR_DEPARTMENT_OTHER): Payer: Medicare Other | Admitting: Physical Therapy

## 2022-10-22 ENCOUNTER — Ambulatory Visit (HOSPITAL_BASED_OUTPATIENT_CLINIC_OR_DEPARTMENT_OTHER): Payer: Medicare Other | Admitting: Physical Therapy

## 2022-10-22 DIAGNOSIS — M25511 Pain in right shoulder: Secondary | ICD-10-CM | POA: Diagnosis not present

## 2022-10-22 DIAGNOSIS — M6281 Muscle weakness (generalized): Secondary | ICD-10-CM

## 2022-10-22 DIAGNOSIS — M25611 Stiffness of right shoulder, not elsewhere classified: Secondary | ICD-10-CM

## 2022-10-22 NOTE — Therapy (Signed)
OUTPATIENT PHYSICAL THERAPY SHOULDER TREATMENT     Patient Name: Genesys Setterberg MRN: [id number] DOB:Mar 01, 1946, 77 y.o., female Today's Date: 10/16/2022   PT End of Session - 10/15/22 1026     Visit Number 43    Number of Visits 15    Date for PT Re-Evaluation 11/05/22    Authorization Type UHC MCR    PT Start Time 0940    PT Stop Time 1012    PT Time Calculation (min) 32 min    Activity Tolerance Patient tolerated treatment well    Behavior During Therapy WFL for tasks assessed/performed                             Past Medical History:  Diagnosis Date   Anemia    Anginal pain ([place])    last cp was last year   Anxiety    Arthritis    Blood transfusion    Chest pain 02/04/2016   Coronary artery disease    Coronary artery spasm, wth continued episodes of chest pain.  09/26/2013   Coronary vasospasm (H[facility])    Diabetes mellitus without complication (HCC)    type II   GERD (gastroesophageal reflux disease)    Headache    Hiatal hernia    Hypercholesteremia    Hypertension    NSTEMI (non-ST elevated myocardial infarction) ([place]) not sure   NSVT (nonsustained ventricular tachycardia) ([place]) 09/26/2013   Panic attack    Past Surgical History:  Procedure Laterality Date   ABDOMINAL HYSTERECTOMY     PARTIAL HYSTERECTOMY   ACDF N/A    C5-6 ACDF Dr. Sherwood Gambler   breast     right, tumor benign   BREAST EXCISIONAL BIOPSY Right    1961 (age 80) benign   CARDIAC CATHETERIZATION  2014   CARDIAC CATHETERIZATION  2012   CARDIAC CATHETERIZATION N/A 07/22/2016   Procedure: Left Heart Cath and Coronary Angiography;  Surgeon: Belva Crome, MD;  Location: [place] [facility];  Service: Cardiovascular;  Laterality: N/A;   cardiac stent  2012   to RCA   CHOLECYSTECTOMY N/A 10/02/2017   Procedure: LAPAROSCOPIC CHOLECYSTECTOMY;  Surgeon: Ralene Ok, MD;  Location: [place];  Service: General;  Laterality: N/A;   CORONARY ANGIOPLASTY     ESOPHAGEAL MANOMETRY N/A  10/29/2016   Procedure: ESOPHAGEAL MANOMETRY (EM);  Surgeon: Garlan Fair, MD;  [facility];  Service: Endoscopy;  Laterality: N/A;   ESOPHAGOGASTRODUODENOSCOPY (EGD) WITH PROPOFOL N/A 10/28/2016   Procedure: ESOPHAGOGASTRODUODENOSCOPY (EGD) WITH PROPOFOL;  Surgeon: Garlan Fair, MD;  [facility];  Service: Endoscopy;  Laterality: N/A;   EXCISION OF SKIN TAG N/A 07/29/2017   Procedure: EXCISION OF PERIANAL  SKIN TAG;  Surgeon: Ralene Ok, MD;  Location: [place];  Service: General;  Laterality: N/A;   KNEE SURGERY  left   left ear surgery     for bad cut   LEFT HEART CATH AND CORONARY ANGIOGRAPHY N/A 10/06/2022   Procedure: LEFT HEART CATH AND CORONARY ANGIOGRAPHY;  Surgeon: Lorretta Harp, MD;  Location: [place] [facility];  Service: Cardiovascular;  Laterality: N/A;   LEFT HEART CATHETERIZATION WITH CORONARY ANGIOGRAM N/A 02/22/2013   Procedure: LEFT HEART CATHETERIZATION WITH CORONARY ANGIOGRAM;  Surgeon: [name], MD;  Location: [facility];  Service: Cardiovascular;  Laterality: N/A;   REVERSE SHOULDER ARTHROPLASTY Right 04/07/2022   Procedure: RIGHT SHOULDER REVERSE SHOULDER ARTHROPLASTY;  Surgeon: Vanetta Mulders, MD;  Location:  [place] [facility];  Service: Orthopedics;  Laterality: Right;   SHOULDER SURGERY Bilateral    rotator cuff   TOE SURGERY Left    toe surgery   Patient Active Problem List   Diagnosis Date Noted   Syncope 10/07/2022   Rotator cuff arthropathy of right shoulder    Carpal tunnel syndrome, right upper limb 01/03/2022   Chest pain 06/21/2021   Variant angina ([place]) 01/24/2017   Precordial chest pain 01/23/2017   Headache 01/23/2017   S/P angioplasty with stent 01/23/2017   Coronary artery disease 11/24/2014   Syncope and collapse 10/22/2014   Hyperglycemia 04/20/2014   H/O hiatal hernia 04/19/2014   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    Coronary artery vasospasm ([place]) 09/26/2013   HTN (hypertension) 12/08/2011   GERD (gastroesophageal reflux  disease) 12/08/2011   Depression 12/08/2011   Dyslipidemia 12/08/2011     REFERRING PROVIDER: Vanetta Mulders MD  REFERRING DIAG: s/p Rt reverse TSA + biceps tenodesis, without subscap repair    THERAPY DIAG:  Acute pain of right shoulder  Stiffness of right shoulder, not elsewhere classified  Muscle weakness (generalized)  Rationale for Evaluation and Treatment Rehabilitation  ONSET DATE: DOS 04/07/22  SUBJECTIVE:                                                                                                                                                                                      SUBJECTIVE STATEMENT: Pt is 28 weeks and 2 days s/p R reverse TSA and biceps tenodesis.    Pt denies having any issues recently with passing out.  Pt denies any adverse effects after prior Rx.  Pt has stiffness in R shoulder and biceps.  She has a nagging ache in R biceps.  Pt states she has been doing her home exercises which helps.  Pt does have difficulty with opening car door from inside.  She also has pain with pulling pants up.  Pt has some pain with applying deodorant though has improved.    PAIN LEVELS:  2/10 Current, 0/10 Best ; 4/10 Worst.  Worst pain is mainly at night.  Pt turns a certain way in her sleep that wakes her up.  Pt is wearing heart monitor until 3/14.   PERTINENT HISTORY: H/o bil RCR R carpal tunnel release in July 2023. Hx of MI with stent placement and angioplasty DM type II   PRECAUTIONS: Shoulder, reverse TSA without subscap repair  WEIGHT BEARING RESTRICTIONS Yes    FALLS:  Has patient fallen in last 6 months? No  LIVING ENVIRONMENT: Lives with: lives with their spouse No stairs  OCCUPATION: Not working  PLOF: Independent  PATIENT GOALS the use of my  arm and hand back, gardening  OBJECTIVE:    TODAY'S TREATMENT:   Therapeutic Exercise: -Reviewed pt presentation, current function, and pain levels.  -Pt received R shoulder PROM in  flexion, abd, ER, and IR per protocol ranges w/n pt and tissue tolerance.  -Pt performed:            Pulleys in flex, scaption, and abd x 20 reps each  Supine rhythmic stab's at 60/90/120 deg of flexion x 30 sec each  Stargazer stretch 3x15 sec  S/L ER 1# 3 x 10   4D ball rolls at 90 deg on wall 2x10 each  Standing wand abd 2x10  Seated wand ER 2x10    Therapeutic Activities:  (to improve functional reaching and lifting) Waist to chest lift with 3# bilat 2x10 Shelf reach 2 x 10 with 1#  Functional IR reaching 2x10           PATIENT EDUCATION: Education details:  objective findings, POC including the plan to decrease frequency.  Progress, HEP, and exercise form/rationale.   Person educated: Patient and Spouse Education method: Explanation, Demonstration, Tactile cues, Verbal cues, and Handouts Education comprehension: verbalized understanding, returned demonstration, verbal cues required, tactile cues required, and needs further education   HOME EXERCISE PROGRAM: [email or web address]    ASSESSMENT:  CLINICAL IMPRESSION: Pt had a recent episode of syncope and was admitted to the hospital.  Pt's cardiac status was checked out and pt denies any MI and no blockages.  She is still wearing a cardiac monitor.  PT limited exercises including intensity of exercises today due to recent health issue.  Pt is progressing well with pain, ROM, and functional usage of R UE.  She reports improved pain, performance of ADL's, and reaching.  She is still limited with reaching behind back.  PT assessed ROM today.  Pt is progressing with R shoulder AROM as evidenced by goniometric measurements.  Pt demonstrates improved AROM t/o R shoulder except no change in IR and minimal improvement in abd.  Pt does have some pain with ER motion.  Pt has met all STG's and LTG's #1,2,3,4 and 7.  Pt partially met LTG #6.  She should benefit from cont skilled PT services to improve ROM and strength and to assist in restoring desired  level of function.    Pt has improved reaching behind back as evidenced by performance of functional IR AROM.  Improved stiffness, no increased pain    OBJECTIVE IMPAIRMENTS decreased activity tolerance, decreased knowledge of condition, decreased ROM, decreased strength, increased muscle spasms, impaired flexibility, impaired UE functional use, improper body mechanics, postural dysfunction, and pain.   ACTIVITY LIMITATIONS carrying, lifting, sleeping, bed mobility, bathing, dressing, reach over head, and hygiene/grooming  PARTICIPATION LIMITATIONS: meal prep, cleaning, laundry, driving, shopping, and yard work  PERSONAL FACTORS Past/current experiences and see PMH  are also affecting patient's functional outcome.   REHAB POTENTIAL: Good  CLINICAL DECISION MAKING: Stable/uncomplicated  EVALUATION COMPLEXITY: Low   GOALS:  SHORT TERM GOALS: Target date: 05/09/2022   AROM flexion to 90 without incr pain and good scap control Baseline:see chart Goal status: GOAL MET  2.  ER in scapular plane to 30 deg Baseline: see chart Goal status: GOAL MET  3.  Pain levels <=3/10 Baseline: see subj reports Goal status:  GOAL MET  4.  D/c sling Baseline: as tolerated at 3 wk Goal status: GOAL MET   LONG TERM GOALS: Target date: 08/20/2022     1.  Pt will meet FOTO goal  Baseline: see above Goal status:  GOAL MET Target date: 06/20/22  2.  AROM flexion to 130 without incr pain and good scap control Baseline:  Goal status:  GOAL MET Target date: 09/18/2022  3.  Use of arm for ADLs pain <=3/10 Baseline:  Goal status: GOAL MET Target date: 05/23/2022   4.  Initiate functional IR behind back without incr pain Baseline: can begin at 6 weeks per post op protocol Goal status: GOAL MET Target date: 05/23/2022   5.  Pt will demo R shoulder AROM to be [facility] t/o for performance of ADLs and IADLs.  Goal status: PROGRESSING Target date:  11/05/2022  6.  Pt will be able to perform her  ADLs and IADLs including reaching activities without significant pain and difficulty.  Goal status: PARTIALLY MET Target date:  11/05/2022   7.  Pt will be able to reach into an overhead cabinet without significant difficulty.  Goal status: GOAL MET Target date: 08/01/22  8.  Pt will demo improved R shoulder strength to at least 4/5 and ER 4-/5 w/n available range for improved tolerance with functional lifting/carrying.   Goal status: not assessed   Target date:  11/05/2022     PLAN: PT FREQUENCY: 1x/wk  PT DURATION: 3 weeks  PLANNED INTERVENTIONS: Therapeutic exercises, Therapeutic activity, Neuromuscular re-education, Balance training, Gait training, Patient/Family education, Self Care, Joint mobilization, Aquatic Therapy, Dry Needling, Electrical stimulation, Spinal mobilization, Cryotherapy, Moist heat, scar mobilization, Taping, Vasopneumatic device, Manual therapy, and Re-evaluation  PLAN FOR NEXT SESSION: continue per Dr. Eddie Dibbles reverse TSA protocol.  Continue to work on strengthening and stability.  Work on ER ROM.     Selinda Michaels III PT, DPT 10/16/22 3:11 PM

## 2022-10-23 ENCOUNTER — Encounter (HOSPITAL_BASED_OUTPATIENT_CLINIC_OR_DEPARTMENT_OTHER): Payer: Self-pay | Admitting: Physical Therapy

## 2022-10-24 ENCOUNTER — Encounter (HOSPITAL_BASED_OUTPATIENT_CLINIC_OR_DEPARTMENT_OTHER): Payer: Medicare Other | Admitting: Physical Therapy

## 2022-10-29 ENCOUNTER — Ambulatory Visit (HOSPITAL_BASED_OUTPATIENT_CLINIC_OR_DEPARTMENT_OTHER): Payer: Medicare Other | Admitting: Physical Therapy

## 2022-10-29 DIAGNOSIS — M6281 Muscle weakness (generalized): Secondary | ICD-10-CM

## 2022-10-29 DIAGNOSIS — M25511 Pain in right shoulder: Secondary | ICD-10-CM

## 2022-10-29 DIAGNOSIS — M25611 Stiffness of right shoulder, not elsewhere classified: Secondary | ICD-10-CM

## 2022-10-29 NOTE — Therapy (Signed)
OUTPATIENT PHYSICAL THERAPY SHOULDER TREATMENT     Patient Name: [name] MRN: [id number] DOB:1945/11/26, 77 y.o., female Today's Date: 10/30/2022   PT End of Session - 10/29/22 0938     Visit Number 45    Number of Visits 46    Date for PT Re-Evaluation 11/05/22    Authorization Type UHC MCR    PT Start Time 3419    PT Stop Time 1020    PT Time Calculation (min) 43 min    Activity Tolerance Patient tolerated treatment well;No increased pain    Behavior During Therapy [facility] for tasks assessed/performed                    Past Medical History:  Diagnosis Date   Anemia    Anginal pain ([place])    last cp was last year   Anxiety    Arthritis    Blood transfusion    Chest pain 02/04/2016   Coronary artery disease    Coronary artery spasm, wth continued episodes of chest pain.  09/26/2013   Coronary vasospasm (HCC)    Diabetes mellitus without complication (HCC)    type II   GERD (gastroesophageal reflux disease)    Headache    Hiatal hernia    Hypercholesteremia    Hypertension    NSTEMI (non-ST elevated myocardial infarction) ([place]) not sure   NSVT (nonsustained ventricular tachycardia) ([place]) 09/26/2013   Panic attack    Past Surgical History:  Procedure Laterality Date   ABDOMINAL HYSTERECTOMY     PARTIAL HYSTERECTOMY   ACDF N/A    C5-6 ACDF Dr. Sherwood Gambler   breast     right, tumor benign   BREAST EXCISIONAL BIOPSY Right    1961 (age 7) benign   CARDIAC CATHETERIZATION  2014   CARDIAC CATHETERIZATION  2012   CARDIAC CATHETERIZATION N/A 07/22/2016   Procedure: Left Heart Cath and Coronary Angiography;  Surgeon: Belva Crome, MD;  Location: [place] CV [facility];  Service: Cardiovascular;  Laterality: N/A;   cardiac stent  2012   to RCA   CHOLECYSTECTOMY N/A 10/02/2017   Procedure: LAPAROSCOPIC CHOLECYSTECTOMY;  Surgeon: Ralene Ok, MD;  Location: [place];  Service: General;  Laterality: N/A;   CORONARY ANGIOPLASTY     ESOPHAGEAL MANOMETRY N/A  10/29/2016   Procedure: ESOPHAGEAL MANOMETRY (EM);  Surgeon: Garlan Fair, MD;  [facility];  Service: Endoscopy;  Laterality: N/A;   ESOPHAGOGASTRODUODENOSCOPY (EGD) WITH PROPOFOL N/A 10/28/2016   Procedure: ESOPHAGOGASTRODUODENOSCOPY (EGD) WITH PROPOFOL;  Surgeon: Garlan Fair, MD;  [facility];  Service: Endoscopy;  Laterality: N/A;   EXCISION OF SKIN TAG N/A 07/29/2017   Procedure: EXCISION OF PERIANAL  SKIN TAG;  Surgeon: Ralene Ok, MD;  Location: [place];  Service: General;  Laterality: N/A;   KNEE SURGERY  left   left ear surgery     for bad cut   LEFT HEART CATH AND CORONARY ANGIOGRAPHY N/A 10/06/2022   Procedure: LEFT HEART CATH AND CORONARY ANGIOGRAPHY;  Surgeon: Lorretta Harp, MD;  Location: [place] [facility];  Service: Cardiovascular;  Laterality: N/A;   LEFT HEART CATHETERIZATION WITH CORONARY ANGIOGRAM N/A 02/22/2013   Procedure: LEFT HEART CATHETERIZATION WITH CORONARY ANGIOGRAM;  Surgeon: [name], MD;  Location: [facility] CATH LAB;  Service: Cardiovascular;  Laterality: N/A;   REVERSE SHOULDER ARTHROPLASTY Right 04/07/2022   Procedure: RIGHT SHOULDER REVERSE SHOULDER ARTHROPLASTY;  Surgeon: Vanetta Mulders, MD;  Location: [place];  Service: Orthopedics;  Laterality:  Right;   SHOULDER SURGERY Bilateral    rotator cuff   TOE SURGERY Left    toe surgery   Patient Active Problem List   Diagnosis Date Noted   Syncope 10/07/2022   Rotator cuff arthropathy of right shoulder    Carpal tunnel syndrome, right upper limb 01/03/2022   Chest pain 06/21/2021   Variant angina ([place]) 01/24/2017   Precordial chest pain 01/23/2017   Headache 01/23/2017   S/P angioplasty with stent 01/23/2017   Coronary artery disease 11/24/2014   Syncope and collapse 10/22/2014   Hyperglycemia 04/20/2014   H/O hiatal hernia 04/19/2014   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    Coronary artery vasospasm ([place]) 09/26/2013   HTN (hypertension) 12/08/2011   GERD (gastroesophageal reflux  disease) 12/08/2011   Depression 12/08/2011   Dyslipidemia 12/08/2011     REFERRING PROVIDER: Vanetta Mulders MD  REFERRING DIAG: s/p Rt reverse TSA + biceps tenodesis, without subscap repair    THERAPY DIAG:  Acute pain of right shoulder  Stiffness of right shoulder, not elsewhere classified  Muscle weakness (generalized)  Rationale for Evaluation and Treatment Rehabilitation  ONSET DATE: DOS 04/07/22  SUBJECTIVE:                                                                                                                                                                                      SUBJECTIVE STATEMENT: Pt is 29 weeks and 2 days s/p R reverse TSA and biceps tenodesis.    Pt denies having any issues recently with passing out.  Pt is not wearing heart monitor anymore and hasn't heard anything about that yet.    Pt denies any adverse effects after prior Rx.  Pt states she still has soreness in R anterior shoulder with certain motions including ER and IR.  She also has pain with pulling pants up.  Pt has some pain with applying deodorant though has improved.  Pt states she is able to open a soda can now.   PAIN LEVELS:  0/10 Current, 0/10 Best ; 4/10 Worst.  Worst pain is mainly at night.  Pt turns a certain way in her sleep that wakes her up.   PERTINENT HISTORY: H/o bil RCR R carpal tunnel release in July 2023. Hx of MI with stent placement and angioplasty DM type II   PRECAUTIONS: Shoulder, reverse TSA without subscap repair  WEIGHT BEARING RESTRICTIONS Yes    FALLS:  Has patient fallen in last 6 months? No  LIVING ENVIRONMENT: Lives with: lives with their spouse No stairs  OCCUPATION: Not working  PLOF: Independent  PATIENT GOALS the use of my arm and hand back, gardening  OBJECTIVE:  TODAY'S TREATMENT:   Therapeutic Exercise: -Reviewed pt presentation, current function, and pain levels.  -Pt received R shoulder PROM in flexion, abd, ER,  and IR per protocol ranges w/n pt and tissue tolerance.  -PT reviewed exercises to improve ER ROM.  PT instructed pt in correct form and positioning.  Pt states she has not been performing supine wand ER at home.  Pt performed supine wand ER approx 15-20, seated wand ER approx 10, and supine stargazer stretch 3x15 sec.      -Pt performed:            Pulleys in flex, scaption, and abd x 20 reps each  Standing jobe's flexion 2# 2x10 and 1x7  Standing scaption 1# 3x10  4D ball rolls at 90 deg on wall 2x10 each      Therapeutic Activities:  (to improve functional reaching and lifting) Waist to chest lift with 3# bilat 2x10 Shelf reach 2 x 10 with 1#  Functional IR reaching 2x10          PATIENT EDUCATION: Education details:  POC including frequency of 1x/wk and discharge planning.  HEP and exercise form/rationale.   Person educated: Patient and Spouse Education method: Explanation, Demonstration, Tactile cues, Verbal cues, and Handouts Education comprehension: verbalized understanding, returned demonstration, verbal cues required, tactile cues required, and needs further education   HOME EXERCISE PROGRAM: [email or web address]    ASSESSMENT:  CLINICAL IMPRESSION: Pt has been doing well since her episode of syncope.  She continues to have pain and difficulty with some ADLs though has improved.  Pt continues to have pain and limitations with ER ROM.  PT educated pt and reviewed exercises to improve ER at home.  Pt has good tolerance with strengthening exercises.  She performed exercises well without c/o's.  Pt responded well to Rx having improved stiffness and reports no increased pain after Rx.  Pt should be ready for discharge next Rx.    OBJECTIVE IMPAIRMENTS decreased activity tolerance, decreased knowledge of condition, decreased ROM, decreased strength, increased muscle spasms, impaired flexibility, impaired UE functional use, improper body mechanics, postural dysfunction, and pain.    ACTIVITY LIMITATIONS carrying, lifting, sleeping, bed mobility, bathing, dressing, reach over head, and hygiene/grooming  PARTICIPATION LIMITATIONS: meal prep, cleaning, laundry, driving, shopping, and yard work  PERSONAL FACTORS Past/current experiences and see PMH  are also affecting patient's functional outcome.   REHAB POTENTIAL: Good  CLINICAL DECISION MAKING: Stable/uncomplicated  EVALUATION COMPLEXITY: Low   GOALS:  SHORT TERM GOALS: Target date: 05/09/2022   AROM flexion to 90 without incr pain and good scap control Baseline:see chart Goal status: GOAL MET  2.  ER in scapular plane to 30 deg Baseline: see chart Goal status: GOAL MET  3.  Pain levels <=3/10 Baseline: see subj reports Goal status:  GOAL MET  4.  D/c sling Baseline: as tolerated at 3 wk Goal status: GOAL MET   LONG TERM GOALS: Target date: 08/20/2022     1.  Pt will meet FOTO goal Baseline: see above Goal status:  GOAL MET Target date: 06/20/22  2.  AROM flexion to 130 without incr pain and good scap control Baseline:  Goal status:  GOAL MET Target date: 09/18/2022  3.  Use of arm for ADLs pain <=3/10 Baseline:  Goal status: GOAL MET Target date: 05/23/2022   4.  Initiate functional IR behind back without incr pain Baseline: can begin at 6 weeks per post op protocol Goal status: GOAL MET Target date: 05/23/2022  5.  Pt will demo R shoulder AROM to be [facility] t/o for performance of ADLs and IADLs.  Goal status: PROGRESSING Target date:  11/05/2022  6.  Pt will be able to perform her ADLs and IADLs including reaching activities without significant pain and difficulty.  Goal status: PARTIALLY MET Target date:  11/05/2022   7.  Pt will be able to reach into an overhead cabinet without significant difficulty.  Goal status: GOAL MET Target date: 08/01/22  8.  Pt will demo improved R shoulder strength to at least 4/5 and ER 4-/5 w/n available range for improved tolerance with functional  lifting/carrying.   Goal status: not assessed   Target date:  11/05/2022     PLAN: PT FREQUENCY: 1x/wk  PT DURATION: 3 weeks  PLANNED INTERVENTIONS: Therapeutic exercises, Therapeutic activity, Neuromuscular re-education, Balance training, Gait training, Patient/Family education, Self Care, Joint mobilization, Aquatic Therapy, Dry Needling, Electrical stimulation, Spinal mobilization, Cryotherapy, Moist heat, scar mobilization, Taping, Vasopneumatic device, Manual therapy, and Re-evaluation  PLAN FOR NEXT SESSION: continue per Dr. Eddie Dibbles reverse TSA protocol.  Continue to work on strengthening and stability.  Work on ER ROM.  Probable discharge next Rx.    Selinda Michaels III PT, DPT 10/30/22 11:54 AM

## 2022-10-30 ENCOUNTER — Encounter (HOSPITAL_BASED_OUTPATIENT_CLINIC_OR_DEPARTMENT_OTHER): Payer: Self-pay | Admitting: Physical Therapy

## 2022-10-30 DIAGNOSIS — R55 Syncope and collapse: Secondary | ICD-10-CM | POA: Diagnosis not present

## 2022-10-31 ENCOUNTER — Encounter (HOSPITAL_BASED_OUTPATIENT_CLINIC_OR_DEPARTMENT_OTHER): Payer: Medicare Other | Admitting: Physical Therapy

## 2022-11-02 NOTE — Therapy (Signed)
OUTPATIENT PHYSICAL THERAPY SHOULDER TREATMENT / DISCHARGE SUMMARY    Patient Name: [name] MRN: [id number] DOB:April 17, 1946, 77 y.o., female Today's Date: 11/04/2022   PT End of Session - 11/03/22 0950     Visit Number 29    Number of Visits 46    Authorization Type UHC MCR    PT Start Time 0947    PT Stop Time 1028    PT Time Calculation (min) 41 min    Activity Tolerance Patient tolerated treatment well    Behavior During Therapy WFL for tasks assessed/performed                     Past Medical History:  Diagnosis Date   Anemia    Anginal pain ([place])    last cp was last year   Anxiety    Arthritis    Blood transfusion    Chest pain 02/04/2016   Coronary artery disease    Coronary artery spasm, wth continued episodes of chest pain.  09/26/2013   Coronary vasospasm (HCC)    Diabetes mellitus without complication (HCC)    type II   GERD (gastroesophageal reflux disease)    Headache    Hiatal hernia    Hypercholesteremia    Hypertension    NSTEMI (non-ST elevated myocardial infarction) ([place]) not sure   NSVT (nonsustained ventricular tachycardia) ([place]) 09/26/2013   Panic attack    Past Surgical History:  Procedure Laterality Date   ABDOMINAL HYSTERECTOMY     PARTIAL HYSTERECTOMY   ACDF N/A    C5-6 ACDF Dr. Sherwood Gambler   breast     right, tumor benign   BREAST EXCISIONAL BIOPSY Right    1961 (age 13) benign   CARDIAC CATHETERIZATION  2014   CARDIAC CATHETERIZATION  2012   CARDIAC CATHETERIZATION N/A 07/22/2016   Procedure: Left Heart Cath and Coronary Angiography;  Surgeon: Belva Crome, MD;  Location: [place] CV [facility];  Service: Cardiovascular;  Laterality: N/A;   cardiac stent  2012   to RCA   CHOLECYSTECTOMY N/A 10/02/2017   Procedure: LAPAROSCOPIC CHOLECYSTECTOMY;  Surgeon: Ralene Ok, MD;  Location: [place];  Service: General;  Laterality: N/A;   CORONARY ANGIOPLASTY     ESOPHAGEAL MANOMETRY N/A 10/29/2016   Procedure: ESOPHAGEAL  MANOMETRY (EM);  Surgeon: Garlan Fair, MD;  [facility];  Service: Endoscopy;  Laterality: N/A;   ESOPHAGOGASTRODUODENOSCOPY (EGD) WITH PROPOFOL N/A 10/28/2016   Procedure: ESOPHAGOGASTRODUODENOSCOPY (EGD) WITH PROPOFOL;  Surgeon: Garlan Fair, MD;  [facility]: [facility];  Service: Endoscopy;  Laterality: N/A;   EXCISION OF SKIN TAG N/A 07/29/2017   Procedure: EXCISION OF PERIANAL  SKIN TAG;  Surgeon: Ralene Ok, MD;  Location: [place];  Service: General;  Laterality: N/A;   KNEE SURGERY  left   left ear surgery     for bad cut   LEFT HEART CATH AND CORONARY ANGIOGRAPHY N/A 10/06/2022   Procedure: LEFT HEART CATH AND CORONARY ANGIOGRAPHY;  Surgeon: Lorretta Harp, MD;  Location: [place] CV LAB;  Service: Cardiovascular;  Laterality: N/A;   LEFT HEART CATHETERIZATION WITH CORONARY ANGIOGRAM N/A 02/22/2013   Procedure: LEFT HEART CATHETERIZATION WITH CORONARY ANGIOGRAM;  Surgeon: [name], MD;  Location[facility] CATH LAB;  Service: Cardiovascular;  Laterality: N/A;   REVERSE SHOULDER ARTHROPLASTY Right 04/07/2022   Procedure: RIGHT SHOULDER REVERSE SHOULDER ARTHROPLASTY;  Surgeon: Vanetta Mulders, MD;  Location: [place];  Service: Orthopedics;  Laterality: Right;   SHOULDER SURGERY Bilateral  rotator cuff   TOE SURGERY Left    toe surgery   Patient Active Problem List   Diagnosis Date Noted   Syncope 10/07/2022   Rotator cuff arthropathy of right shoulder    Carpal tunnel syndrome, right upper limb 01/03/2022   Chest pain 06/21/2021   Variant angina ([place]) 01/24/2017   Precordial chest pain 01/23/2017   Headache 01/23/2017   S/P angioplasty with stent 01/23/2017   Coronary artery disease 11/24/2014   Syncope and collapse 10/22/2014   Hyperglycemia 04/20/2014   H/O hiatal hernia 04/19/2014   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    Coronary artery vasospasm ([place]) 09/26/2013   HTN (hypertension) 12/08/2011   GERD (gastroesophageal reflux disease) 12/08/2011   Depression  12/08/2011   Dyslipidemia 12/08/2011     REFERRING PROVIDER: Vanetta Mulders MD  REFERRING DIAG: s/p Rt reverse TSA + biceps tenodesis, without subscap repair    THERAPY DIAG:  Acute pain of right shoulder  Stiffness of right shoulder, not elsewhere classified  Muscle weakness (generalized)  Rationale for Evaluation and Treatment Rehabilitation  ONSET DATE: DOS 04/07/22  SUBJECTIVE:                                                                                                                                                                                      SUBJECTIVE STATEMENT: Pt is 30 weeks s/p R reverse TSA and biceps tenodesis.  Pt states she still gets some soreness and stiffness.  She uses ice which helps.  Pt denies any adverse effects after prior Rx, just a little soreness.  Pt states she still has soreness in R anterior shoulder with ER. Pt is able to pull her pants up easier.  Pt has some pain with applying deodorant though has improved.  Pt states she is able to open a soda can.  Pt reports she is able to perform her ADLs/IADLs and reaching activities without significant difficulty and pain.   PAIN LEVELS:  1/10 Current, 0/10 Best ; 4/10 Worst.   TYPE:  nagging ache Worst pain is mainly at night.  Pt turns a certain way in her sleep that wakes her up.   PERTINENT HISTORY: H/o bil RCR R carpal tunnel release in July 2023. Hx of MI with stent placement and angioplasty DM type II   PRECAUTIONS: Shoulder, reverse TSA without subscap repair  WEIGHT BEARING RESTRICTIONS Yes    FALLS:  Has patient fallen in last 6 months? No  LIVING ENVIRONMENT: Lives with: lives with their spouse No stairs  OCCUPATION: Not working  PLOF: Independent  PATIENT GOALS the use of my arm and hand back, gardening  OBJECTIVE:    TODAY'S TREATMENT:  Therapeutic Exercise:  -R shoulder AROM:   Flex:  145 deg Scaption:  137 Abd:  99  ER:  51 IR:  60   Functional ER  AROM:  R/L:  T2 / T5  -R shoulder strength: Flex:  4-/5 ER:  4 to 4-/5    -Reviewed response to prior Rx, current function, and pain levels.  -PT reviewed HEP and updated HEP.  PT extensively went through HEP and educated pt in correct form, correct exercises, appropriate resistance, and appropriate frequency of exercises. Pt received an updated HEP.    -Pt performed:   Standing jobe's flexion 2# 2x10  Standing scaption 1# 2x10  4D ball rolls at 90 deg on wall 2x10 each  Supine wand ER 2x10  Shelf reach 2 x 10 with 1#       PATIENT EDUCATION: Education details:  objective findings, goal progress, POC/discharge planning.  HEP and exercise form/rationale.   Person educated: Patient and Spouse Education method: Explanation, Demonstration, Tactile cues, Verbal cues, and Handouts Education comprehension: verbalized understanding, returned demonstration, verbal cues required, tactile cues required, and needs further education   HOME EXERCISE PROGRAM: [email or web address]   Updated HEP: - Standing Shoulder Flexion with Dumbbells  - 3 x weekly - 3 sets - 10 reps - Scaption with Dumbbells  - 3 x weekly - 3 sets - 10 reps - Standing Wall [employer] with Mini Swiss Ball  - 1 x daily - 4 x weekly - 2 sets - 10 reps - Supine Shoulder External Rotation in 45 Degrees Abduction AAROM with [place]  - 2 x daily - 7 x weekly - 1-2 sets - 10 reps - Sidelying Shoulder External Rotation  - 1 x daily - 3 x weekly - 2 sets - 10 reps - Shelf reach 3x/week  2  sets of 10 reps   ASSESSMENT:  CLINICAL IMPRESSION: Pt has made good progress in PT.  Pt reports she is able to perform her ADLs/IADLs and reaching activities without significant difficulty and pain.  She has made good progress with with AROM t/o R shoulder and does have tightness and limitations with abd.  Pt is able to elevate R UE well overhead and is able to reach onto an overhead shelf.  She continues to have weakness in R shoulder though has a HEP  to address those deficits.  Pt demonstrates good understanding of HEP.  PT advanced HEP today and she is independent with HEP.  Pt didn't fill out FOTO due to already meeting her FOTO goal.  Pt has met all of her goals except partially meeting LTG's #5 and 8.  Pt is ready for discharge.     OBJECTIVE IMPAIRMENTS decreased activity tolerance, decreased knowledge of condition, decreased ROM, decreased strength, increased muscle spasms, impaired flexibility, impaired UE functional use, improper body mechanics, postural dysfunction, and pain.   ACTIVITY LIMITATIONS carrying, lifting, sleeping, bed mobility, bathing, dressing, reach over head, and hygiene/grooming  PARTICIPATION LIMITATIONS: meal prep, cleaning, laundry, driving, shopping, and yard work  PERSONAL FACTORS Past/current experiences and see PMH  are also affecting patient's functional outcome.   REHAB POTENTIAL: Good  CLINICAL DECISION MAKING: Stable/uncomplicated  EVALUATION COMPLEXITY: Low   GOALS:  SHORT TERM GOALS: Target date: 05/09/2022   AROM flexion to 90 without incr pain and good scap control Baseline:see chart Goal status: GOAL MET  2.  ER in scapular plane to 30 deg Baseline: see chart Goal status: GOAL MET  3.  Pain levels <=3/10 Baseline: see subj  reports Goal status:  GOAL MET  4.  D/c sling Baseline: as tolerated at 3 wk Goal status: GOAL MET   LONG TERM GOALS: Target date: 08/20/2022     1.  Pt will meet FOTO goal Baseline: see above Goal status:  GOAL MET Target date: 06/20/22  2.  AROM flexion to 130 without incr pain and good scap control Baseline:  Goal status:  GOAL MET Target date: 09/18/2022  3.  Use of arm for ADLs pain <=3/10 Baseline:  Goal status: GOAL MET Target date: 05/23/2022   4.  Initiate functional IR behind back without incr pain Baseline: can begin at 6 weeks per post op protocol Goal status: GOAL MET Target date: 05/23/2022   5.  Pt will demo R shoulder AROM to be  [facility] t/o for performance of ADLs and IADLs.  Goal status: PARTIALLY MET Target date:  11/05/2022  6.  Pt will be able to perform her ADLs and IADLs including reaching activities without significant pain and difficulty.  Goal status: GOAL MET Target date:  11/05/2022   7.  Pt will be able to reach into an overhead cabinet without significant difficulty.  Goal status: GOAL MET Target date: 08/01/22  8.  Pt will demo improved R shoulder strength to at least 4/5 and ER 4-/5 w/n available range for improved tolerance with functional lifting/carrying.   Goal status: PARTIALLY MET  Target date:  11/05/2022     PLAN:  PLANNED INTERVENTIONS: Therapeutic exercises, Therapeutic activity, Neuromuscular re-education, Balance training, Gait training, Patient/Family education, Self Care, Joint mobilization, Aquatic Therapy, Dry Needling, Electrical stimulation, Spinal mobilization, Cryotherapy, Moist heat, scar mobilization, Taping, Vasopneumatic device, Manual therapy, and Re-evaluation  PLAN FOR NEXT SESSION:   Pt to be discharged from skilled PT due to good functional progress and meeting the majority of her goals.  She is independent with and will cont with HEP.  Pt is agreeable with discharge.   PHYSICAL THERAPY DISCHARGE SUMMARY  Visits from Start of Care: 46  Current functional level related to goals / functional outcomes: See above   Remaining deficits: See above   Education / Equipment: See above      Selinda Michaels III PT, DPT 11/04/22 9:50 AM

## 2022-11-03 ENCOUNTER — Ambulatory Visit (HOSPITAL_BASED_OUTPATIENT_CLINIC_OR_DEPARTMENT_OTHER): Payer: Medicare Other | Admitting: Physical Therapy

## 2022-11-03 DIAGNOSIS — M25511 Pain in right shoulder: Secondary | ICD-10-CM | POA: Diagnosis not present

## 2022-11-03 DIAGNOSIS — M6281 Muscle weakness (generalized): Secondary | ICD-10-CM | POA: Diagnosis not present

## 2022-11-03 DIAGNOSIS — M25611 Stiffness of right shoulder, not elsewhere classified: Secondary | ICD-10-CM | POA: Diagnosis not present

## 2022-11-03 NOTE — Progress Notes (Unsigned)
Office Visit    Patient Name: [name] Date of Encounter: 11/04/2022  PCP:  [name], MD   [facility]  Cardiologist:  Freada Bergeron, MD  Advanced Practice Provider:  No care team member to display Electrophysiologist:  None   HPI    [name] is a 77 y.o. female with a past medical history of obstructive CAD with prior stenting, well-documented recurrent coronary spasm, recurrent syncope with neuromediated features, hypertension, hyperlipidemia, tobacco abuse, and GERD presents today for follow-up.  She was last seen 03/14/2022 and at that time was doing well.  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  Nitroglycerin relieves episodes of discomfort.  Angina is almost always in the setting of rest pain rather than exertional.  Despite being on amlodipine and long-acting nitrates she occasionally has to use sublingual nitroglycerin and gets relief.  Last episode was the week prior to her appointment.  Unfortunately, she continued to smoke cigarettes at that time.  The damage of smoking has been discussed with her several times. She is unable to quit.  Today, she states that she has occasional sharp pains along her left chest wall and was diagnosed with microvascular spasm.  We went through her most recent testing including echocardiogram, stress test, coronary CTA, cardiac catheterization, and monitor.  All of which were unremarkable for any heart related culprit to her symptoms.  We have discussed increasing her amlodipine today to help with her spasms.  She did not tolerate [name] so we have discontinued this today.  She is to let us know if she experiences any shortness of breath or swelling last echocardiogram with mildly depressed EF, 45%.  \  Reports no shortness of breath nor dyspnea on exertion. No edema, orthopnea, PND. Reports no palpitations.     Past Medical History    Past Medical History:  Diagnosis Date   Anemia    Anginal pain ([place])    last cp was last year   Anxiety    Arthritis    Blood transfusion    Chest pain 02/04/2016   Coronary artery disease    Coronary artery spasm, wth continued episodes of chest pain.  09/26/2013   Coronary vasospasm (H[facility])    Diabetes mellitus without complication (HCC)    type II   GERD (gastroesophageal reflux disease)    Headache    Hiatal hernia    Hypercholesteremia    Hypertension    NSTEMI (non-ST elevated myocardial infarction) ([place]) not sure   NSVT (nonsustained ventricular tachycardia) ([place]) 09/26/2013   Panic attack    Past Surgical History:  Procedure Laterality Date   ABDOMINAL HYSTERECTOMY     PARTIAL HYSTERECTOMY   ACDF N/A    C5-6 ACDF Dr. Sherwood Gambler   breast     right, tumor benign   BREAST EXCISIONAL BIOPSY Right    1961 (age 62) benign   CARDIAC CATHETERIZATION  2014   CARDIAC CATHETERIZATION  2012   CARDIAC CATHETERIZATION N/A 07/22/2016   Procedure: Left Heart Cath and Coronary Angiography;  Surgeon: Belva Crome, MD;  Location: [facility];  Service: Cardiovascular;  Laterality: N/A;   cardiac stent  2012   to RCA   CHOLECYSTECTOMY N/A 10/02/2017   Procedure: LAPAROSCOPIC CHOLECYSTECTOMY;  Surgeon: Ralene Ok, MD;  Location: [facility];  Service: General;  Laterality: N/A;   CORONARY ANGIOPLASTY     ESOPHAGEAL MANOMETRY N/A 10/29/2016  Procedure: ESOPHAGEAL MANOMETRY (EM);  Surgeon: Garlan Fair, MD;  Location: [facility];  Service: Endoscopy;  Laterality: N/A;   ESOPHAGOGASTRODUODENOSCOPY (EGD) WITH PROPOFOL N/A 10/28/2016   Procedure: ESOPHAGOGASTRODUODENOSCOPY (EGD) WITH PROPOFOL;  Surgeon: Garlan Fair, MD;  Location: [facility];  Service: Endoscopy;  Laterality: N/A;   EXCISION OF SKIN TAG N/A 07/29/2017   Procedure: EXCISION OF PERIANAL  SKIN TAG;  Surgeon: Ralene Ok, MD;  Location: [facility];  Service: General;   Laterality: N/A;   KNEE SURGERY  left   left ear surgery     for bad cut   LEFT HEART CATH AND CORONARY ANGIOGRAPHY N/A 10/06/2022   Procedure: LEFT HEART CATH AND CORONARY ANGIOGRAPHY;  Surgeon: Lorretta Harp, MD;  Location: [facility];  Service: Cardiovascular;  Laterality: N/A;   LEFT HEART CATHETERIZATION WITH CORONARY ANGIOGRAM N/A 02/22/2013   Procedure: LEFT HEART CATHETERIZATION WITH CORONARY ANGIOGRAM;  Surgeon: [name], MD;  Location: [facility];  Service: Cardiovascular;  Laterality: N/A;   REVERSE SHOULDER ARTHROPLASTY Right 04/07/2022   Procedure: RIGHT SHOULDER REVERSE SHOULDER ARTHROPLASTY;  Surgeon: Vanetta Mulders, MD;  Location: [facility];  Service: Orthopedics;  Laterality: Right;   SHOULDER SURGERY Bilateral    rotator cuff   TOE SURGERY Left    toe surgery    Allergies  No Known Allergies EKGs/Labs/Other Studies Reviewed:   The following studies were reviewed today:  Cardiac cath 10/06/22 Left Anterior Descending  Prox LAD lesion is 30% stenosed.    First Diagonal Branch  Ost 1st Diag lesion is 40% stenosed.    Left Circumflex    Second Obtuse Marginal Branch  Vessel is small in size.    Third Obtuse Marginal Branch  Vessel is small in size.    Right Coronary Artery  Prox RCA to Mid RCA lesion is 25% stenosed. The lesion was previously treated .    Intervention   No interventions have been documented.   Coronary Diagrams  Diagnostic Dominance: Right  Intervention   EKG:  EKG is not ordered today.    Recent Labs: 10/05/2022: Hemoglobin 14.1; Magnesium 1.9; Platelets 256 10/07/2022: BUN 18; Creatinine, Ser 0.81; Potassium 4.1; Sodium 139  Recent Lipid Panel    Component Value Date/Time   CHOL 155 06/22/2021 0336   TRIG 80 06/22/2021 0336   HDL 75 06/22/2021 0336   CHOLHDL 2.1 06/22/2021 0336   VLDL 16 06/22/2021 0336   LDLCALC 64 06/22/2021 0336     Home Medications   Current Meds  Medication Sig   ACCU-CHEK  GUIDE test strip USE TO CHECK BLOOD SUGAR DAILY   Accu-Chek Softclix Lancets lancets daily.   acetaminophen (TYLENOL) 500 MG tablet Take 500 mg by mouth every 6 (six) hours as needed for headache (pain).   amLODipine (NORVASC) 5 MG tablet Take 1 tablet (5 mg total) by mouth daily.   aspirin EC 325 MG tablet Take 1 tablet (325 mg total) by mouth daily.   isosorbide mononitrate (IMDUR) 30 MG 24 hr tablet Take 0.5 tablets (15 mg total) by mouth daily.   metFORMIN (GLUCOPHAGE-XR) 500 MG 24 hr tablet Take 500 mg by mouth 2 (two) times daily.   nitroGLYCERIN (NITROSTAT) 0.4 MG SL tablet PLACE 1 TABLET UNDER TONGUE AS NEEDED FOR CHEST PAIN EVERY 5 MINUTES UP TO 3 DOSES THEN SEEK MEDICAL ATTENTION. Please make overdue appt. 1st attempt   pantoprazole (PROTONIX) 40 MG tablet Take 1 tablet (40 mg total) by mouth 2 (two) times  daily.   rosuvastatin (CRESTOR) 5 MG tablet Take 1 tablet (5 mg total) by mouth daily at 6 PM.   traZODone (DESYREL) 50 MG tablet Take 50-100 mg by mouth at bedtime. 1 tablet at bedtime, if not working will take second tablet   [DISCONTINUED] amLODipine (NORVASC) 2.5 MG tablet Take 1 tablet (2.5 mg total) by mouth daily.   [DISCONTINUED] dapagliflozin propanediol (FARXIGA) 10 MG TABS tablet Take 1 tablet (10 mg total) by mouth daily before breakfast.   [DISCONTINUED] mirtazapine (REMERON) 15 MG tablet Take 15 mg by mouth at bedtime.     Review of Systems      All other systems reviewed and are otherwise negative except as noted above.  Physical Exam    VS:  BP 120/70   Pulse 82   Ht 4\' 11"  (1.499 m)   Wt 120 lb 6.4 oz (54.6 kg)   SpO2 97%   BMI 24.32 kg/m  , BMI Body mass index is 24.32 kg/m.  Wt Readings from Last 3 Encounters:  11/04/22 120 lb 6.4 oz (54.6 kg)  10/05/22 118 lb 11.2 oz (53.8 kg)  04/07/22 112 lb (50.8 kg)     GEN: Well nourished, well developed, in no acute distress. HEENT: normal. Neck: Supple, no JVD, carotid bruits, or masses. Cardiac: RRR, no  murmurs, rubs, or gallops. No clubbing, cyanosis, edema.  Radials/PT 2+ and equal bilaterally.  Respiratory:  Respirations regular and unlabored, clear to auscultation bilaterally. GI: Soft, nontender, nondistended. MS: No deformity or atrophy. Skin: Warm and dry, no rash. Neuro:  Strength and sensation are intact. Psych: Normal affect.  Assessment & Plan    Coronary artery vasospasm -Still occurring, will increase amlodipine to 5 mg daily -Please keep track of your blood pressure closely -Continue other current medications ASA 81 mg, amlodipine 2.5 mg (recently increased to 5 mg daily), discontinued Farxiga, Imdur 15 mg daily, nitro as needed, Crestor 5 mg daily  Coronary artery disease -Continue current medications  -Chest pain is unchanged and is more like a pinching chest which is intermittent and nonexertional  Dyslipidemia -lipid panel and LFTs -Lipoprotein a was elevated and we will likely need to increase Crestor  Primary hypertension -Blood pressure well-controlled today 120/70 -I have asked her to watch her blood pressure closely since we are increasing her amlodipine    Disposition: Follow up 3-4 months with Freada Bergeron, MD or APP.  Signed, Elgie Collard, PA-C 11/04/2022, 11:45 AM [facility]

## 2022-11-04 ENCOUNTER — Encounter (HOSPITAL_BASED_OUTPATIENT_CLINIC_OR_DEPARTMENT_OTHER): Payer: Self-pay | Admitting: Physical Therapy

## 2022-11-04 ENCOUNTER — Ambulatory Visit: Payer: Medicare Other | Attending: Physician Assistant | Admitting: Physician Assistant

## 2022-11-04 ENCOUNTER — Encounter: Payer: Self-pay | Admitting: Physician Assistant

## 2022-11-04 VITALS — BP 120/70 | HR 82 | Ht 59.0 in | Wt 120.4 lb

## 2022-11-04 DIAGNOSIS — I201 Angina pectoris with documented spasm: Secondary | ICD-10-CM

## 2022-11-04 DIAGNOSIS — I2510 Atherosclerotic heart disease of native coronary artery without angina pectoris: Secondary | ICD-10-CM

## 2022-11-04 DIAGNOSIS — E785 Hyperlipidemia, unspecified: Secondary | ICD-10-CM | POA: Diagnosis not present

## 2022-11-04 DIAGNOSIS — I10 Essential (primary) hypertension: Secondary | ICD-10-CM | POA: Diagnosis not present

## 2022-11-04 DIAGNOSIS — G5601 Carpal tunnel syndrome, right upper limb: Secondary | ICD-10-CM

## 2022-11-04 DIAGNOSIS — I25111 Atherosclerotic heart disease of native coronary artery with angina pectoris with documented spasm: Secondary | ICD-10-CM

## 2022-11-04 MED ORDER — AMLODIPINE BESYLATE 5 MG PO TABS: 5.0000 mg | ORAL_TABLET | Freq: Every day | ORAL | 3 refills | Status: DC

## 2022-11-04 NOTE — Patient Instructions (Signed)
Medication Instructions:  1.Stop farxiga 2.Increase amlodipine (Norvasc) to 5 mg daily *If you need a refill on your cardiac medications before your next appointment, please call your pharmacy*   Lab Work: Schedule lipids and lft's for a day that you can come fasting If you have labs (blood work) drawn today and your tests are completely normal, you will receive your results only by: [place] (if you have MyChart) OR A paper copy in the mail If you have any lab test that is abnormal or we need to change your treatment, we will call you to review the results.   Follow-Up: At [facility], you and your health needs are our priority.  As part of our continuing mission to provide you with exceptional heart care, we have created designated Provider Care Teams.  These Care Teams include your primary Cardiologist (physician) and Advanced Practice Providers (APPs -  Physician Assistants and Nurse Practitioners) who all work together to provide you with the care you need, when you need it.  Your next appointment:   3-4 month(s)  Provider:   Freada Bergeron, MD   Other Instructions Check your blood pressure daily, one hour after taking your morning medications for 2 weeks, keep a log and send [place] the readings through mychart at the end of the 2 weeks Weigh every morning after using the restroom, before breakfast and call and let us know if your have a weight gain of 2 or more pounds overnight or 5 pounds or more in 1 week  Low-Sodium Eating Plan Sodium, which is an element that makes up salt, helps you maintain a healthy balance of fluids in your body. Too much sodium can increase your blood pressure and cause fluid and waste to be held in your body. Your health care provider or dietitian may recommend following this plan if you have high blood pressure (hypertension), kidney disease, liver disease, or heart failure. Eating less sodium can help lower your blood pressure, reduce  swelling, and protect your heart, liver, and kidneys. What are tips for following this plan? Reading food labels The Nutrition Facts label lists the amount of sodium in one serving of the food. If you eat more than one serving, you must multiply the listed amount of sodium by the number of servings. Choose foods with less than 140 mg of sodium per serving. Avoid foods with 300 mg of sodium or more per serving. Shopping  Look for lower-sodium products, often labeled as "low-sodium" or "no salt added." Always check the sodium content, even if foods are labeled as "unsalted" or "no salt added." Buy fresh foods. Avoid canned foods and pre-made or frozen meals. Avoid canned, cured, or processed meats. Buy breads that have less than 80 mg of sodium per slice. Cooking  Eat more home-cooked food and less restaurant, buffet, and fast food. Avoid adding salt when cooking. Use salt-free seasonings or herbs instead of table salt or sea salt. Check with your health care provider or pharmacist before using salt substitutes. Cook with plant-based oils, such as canola, sunflower, or olive oil. Meal planning When eating at a restaurant, ask that your food be prepared with less salt or no salt, if possible. Avoid dishes labeled as brined, pickled, cured, smoked, or made with soy sauce, miso, or teriyaki sauce. Avoid foods that contain MSG (monosodium glutamate). MSG is sometimes added to [place] food, bouillon, and some canned foods. Make meals that can be grilled, baked, poached, roasted, or steamed. These are generally  made with less sodium. General information Most people on this plan should limit their sodium intake to 1,500-2,000 mg (milligrams) of sodium each day. What foods should I eat? Fruits Fresh, frozen, or canned fruit. Fruit juice. Vegetables Fresh or frozen vegetables. "No salt added" canned vegetables. "No salt added" tomato sauce and paste. Low-sodium or reduced-sodium tomato and  vegetable juice. Grains Low-sodium cereals, including oats, puffed wheat and rice, and shredded wheat. Low-sodium crackers. Unsalted rice. Unsalted pasta. Low-sodium bread. Whole-grain breads and whole-grain pasta. Meats and other proteins Fresh or frozen (no salt added) meat, poultry, seafood, and fish. Low-sodium canned tuna and salmon. Unsalted nuts. Dried peas, beans, and lentils without added salt. Unsalted canned beans. Eggs. Unsalted nut butters. Dairy Milk. Soy milk. Cheese that is naturally low in sodium, such as ricotta cheese, fresh mozzarella, or Swiss cheese. Low-sodium or reduced-sodium cheese. Cream cheese. Yogurt. Seasonings and condiments Fresh and dried herbs and spices. Salt-free seasonings. Low-sodium mustard and ketchup. Sodium-free salad dressing. Sodium-free light mayonnaise. Fresh or refrigerated horseradish. Lemon juice. Vinegar. Other foods Homemade, reduced-sodium, or low-sodium soups. Unsalted popcorn and pretzels. Low-salt or salt-free chips. The items listed above may not be a complete list of foods and beverages you can eat. Contact a dietitian for more information. What foods should I avoid? Vegetables Sauerkraut, pickled vegetables, and relishes. Olives. [place] fries. Onion rings. Regular canned vegetables (not low-sodium or reduced-sodium). Regular canned tomato sauce and paste (not low-sodium or reduced-sodium). Regular tomato and vegetable juice (not low-sodium or reduced-sodium). Frozen vegetables in sauces. Grains Instant hot cereals. Bread stuffing, pancake, and biscuit mixes. Croutons. Seasoned rice or pasta mixes. Noodle soup cups. Boxed or frozen macaroni and cheese. Regular salted crackers. Self-rising flour. Meats and other proteins Meat or fish that is salted, canned, smoked, spiced, or pickled. Precooked or cured meat, such as sausages or meat loaves. [place] Salines. Ham. Pepperoni. Hot dogs. Corned beef. Chipped beef. Salt pork. Jerky. Pickled herring.  Anchovies and sardines. Regular canned tuna. Salted nuts. Dairy Processed cheese and cheese spreads. Hard cheeses. Cheese curds. Blue cheese. Feta cheese. String cheese. Regular cottage cheese. Buttermilk. Canned milk. Fats and oils Salted butter. Regular margarine. Ghee. Bacon fat. Seasonings and condiments Onion salt, garlic salt, seasoned salt, table salt, and sea salt. Canned and packaged gravies. Worcestershire sauce. Tartar sauce. Barbecue sauce. Teriyaki sauce. Soy sauce, including reduced-sodium. Steak sauce. Fish sauce. Oyster sauce. Cocktail sauce. Horseradish that you find on the shelf. Regular ketchup and mustard. Meat flavorings and tenderizers. Bouillon cubes. Hot sauce. Pre-made or packaged marinades. Pre-made or packaged taco seasonings. Relishes. Regular salad dressings. Salsa. Other foods Salted popcorn and pretzels. Corn chips and puffs. Potato and tortilla chips. Canned or dried soups. Pizza. Frozen entrees and pot pies. The items listed above may not be a complete list of foods and beverages you should avoid. Contact a dietitian for more information. Summary Eating less sodium can help lower your blood pressure, reduce swelling, and protect your heart, liver, and kidneys. Most people on this plan should limit their sodium intake to 1,500-2,000 mg (milligrams) of sodium each day. Canned, boxed, and frozen foods are high in sodium. Restaurant foods, fast foods, and pizza are also very high in sodium. You also get sodium by adding salt to food. Try to cook at home, eat more fresh fruits and vegetables, and eat less fast food and canned, processed, or prepared foods. This information is not intended to replace advice given to you by your health care provider. Make sure you discuss  any questions you have with your health care provider. Document Revised: 07/04/2019 Document Reviewed: 06/29/2019 Elsevier Patient Education  [place] Many factors  influence your heart health, including eating and exercise habits. Heart health is also called coronary health. Coronary risk increases with abnormal blood fat (lipid) levels. A heart-healthy eating plan includes limiting unhealthy fats, increasing healthy fats, limiting salt (sodium) intake, and making other diet and lifestyle changes. What is my plan? Your health care provider may recommend that: You limit your fat intake to _________% or less of your total calories each day. You limit your saturated fat intake to _________% or less of your total calories each day. You limit the amount of cholesterol in your diet to less than _________ mg per day. You limit the amount of sodium in your diet to less than _________ mg per day. What are tips for following this plan? Cooking Cook foods using methods other than frying. Baking, boiling, grilling, and broiling are all good options. Other ways to reduce fat include: Removing the skin from poultry. Removing all visible fats from meats. Steaming vegetables in water or broth. Meal planning  At meals, imagine dividing your plate into fourths: Fill one-half of your plate with vegetables and green salads. Fill one-fourth of your plate with whole grains. Fill one-fourth of your plate with lean protein foods. Eat 2-4 cups of vegetables per day. One cup of vegetables equals 1 cup (91 g) broccoli or cauliflower florets, 2 medium carrots, 1 large bell pepper, 1 large sweet potato, 1 large tomato, 1 medium white potato, 2 cups (150 g) raw leafy greens. Eat 1-2 cups of fruit per day. One cup of fruit equals 1 small apple, 1 large banana, 1 cup (237 g) mixed fruit, 1 large orange,  cup (82 g) dried fruit, 1 cup (240 mL) 100% fruit juice. Eat more foods that contain soluble fiber. Examples include apples, broccoli, carrots, beans, peas, and barley. Aim to get 25-30 g of fiber per day. Increase your consumption of legumes, nuts, and seeds to 4-5 servings per  week. One serving of dried beans or legumes equals  cup (90 g) cooked, 1 serving of nuts is  oz (12 almonds, 24 pistachios, or 7 walnut halves), and 1 serving of seeds equals  oz (8 g). Fats Choose healthy fats more often. Choose monounsaturated and polyunsaturated fats, such as olive and canola oils, avocado oil, flaxseeds, walnuts, almonds, and seeds. Eat more omega-3 fats. Choose salmon, mackerel, sardines, tuna, flaxseed oil, and ground flaxseeds. Aim to eat fish at least 2 times each week. Check food labels carefully to identify foods with trans fats or high amounts of saturated fat. Limit saturated fats. These are found in animal products, such as meats, butter, and cream. Plant sources of saturated fats include palm oil, palm kernel oil, and coconut oil. Avoid foods with partially hydrogenated oils in them. These contain trans fats. Examples are stick margarine, some tub margarines, cookies, crackers, and other baked goods. Avoid fried foods. General information Eat more home-cooked food and less restaurant, buffet, and fast food. Limit or avoid alcohol. Limit foods that are high in added sugar and simple starches such as foods made using white refined flour (white breads, pastries, sweets). Lose weight if you are overweight. Losing just 5-10% of your body weight can help your overall health and prevent diseases such as diabetes and heart disease. Monitor your sodium intake, especially if you have high blood pressure. Talk with your health  care provider about your sodium intake. Try to incorporate more vegetarian meals weekly. What foods should I eat? Fruits All fresh, canned (in natural juice), or frozen fruits. Vegetables Fresh or frozen vegetables (raw, steamed, roasted, or grilled). Green salads. Grains Most grains. Choose whole wheat and whole grains most of the time. Rice and pasta, including brown rice and pastas made with whole wheat. Meats and other proteins Lean,  well-trimmed beef, veal, pork, and lamb. Chicken and [place] without skin. All fish and shellfish. Wild duck, rabbit, pheasant, and venison. Egg whites or low-cholesterol egg substitutes. Dried beans, peas, lentils, and tofu. Seeds and most nuts. Dairy Low-fat or nonfat cheeses, including ricotta and mozzarella. Skim or 1% milk (liquid, powdered, or evaporated). Buttermilk made with low-fat milk. Nonfat or low-fat yogurt. Fats and oils Non-hydrogenated (trans-free) margarines. Vegetable oils, including soybean, sesame, sunflower, olive, avocado, peanut, safflower, corn, canola, and cottonseed. Salad dressings or mayonnaise made with a vegetable oil. Beverages Water (mineral or sparkling). Coffee and tea. Unsweetened ice tea. Diet beverages. Sweets and desserts Sherbet, gelatin, and fruit ice. Small amounts of dark chocolate. Limit all sweets and desserts. Seasonings and condiments All seasonings and condiments. The items listed above may not be a complete list of foods and beverages you can eat. Contact a dietitian for more options. What foods should I avoid? Fruits Canned fruit in heavy syrup. Fruit in cream or butter sauce. Fried fruit. Limit coconut. Vegetables Vegetables cooked in cheese, cream, or butter sauce. Fried vegetables. Grains Breads made with saturated or trans fats, oils, or whole milk. Croissants. Sweet rolls. Donuts. High-fat crackers, such as cheese crackers and chips. Meats and other proteins Fatty meats, such as hot dogs, ribs, sausage, bacon, rib-eye roast or steak. High-fat deli meats, such as salami and bologna. Caviar. Domestic duck and goose. Organ meats, such as liver. Dairy Cream, sour cream, cream cheese, and creamed cottage cheese. Whole-milk cheeses. Whole or 2% milk (liquid, evaporated, or condensed). Whole buttermilk. Cream sauce or high-fat cheese sauce. Whole-milk yogurt. Fats and oils Meat fat, or shortening. Cocoa butter, hydrogenated oils, palm oil,  coconut oil, palm kernel oil. Solid fats and shortenings, including bacon fat, salt pork, lard, and butter. Nondairy cream substitutes. Salad dressings with cheese or sour cream. Beverages Regular sodas and any drinks with added sugar. Sweets and desserts Frosting. Pudding. Cookies. Cakes. Pies. Milk chocolate or white chocolate. Buttered syrups. Full-fat ice cream or ice cream drinks. The items listed above may not be a complete list of foods and beverages to avoid. Contact a dietitian for more information. Summary Heart-healthy meal planning includes limiting unhealthy fats, increasing healthy fats, limiting salt (sodium) intake and making other diet and lifestyle changes. Lose weight if you are overweight. Losing just 5-10% of your body weight can help your overall health and prevent diseases such as diabetes and heart disease. Focus on eating a balance of foods, including fruits and vegetables, low-fat or nonfat dairy, lean protein, nuts and legumes, whole grains, and heart-healthy oils and fats. This information is not intended to replace advice given to you by your health care provider. Make sure you discuss any questions you have with your health care provider. Document Revised: 09/02/2021 Document Reviewed: 09/02/2021 Elsevier Patient Education  [place].

## 2022-11-05 ENCOUNTER — Ambulatory Visit (INDEPENDENT_AMBULATORY_CARE_PROVIDER_SITE_OTHER): Payer: Medicare Other

## 2022-11-05 ENCOUNTER — Other Ambulatory Visit (HOSPITAL_BASED_OUTPATIENT_CLINIC_OR_DEPARTMENT_OTHER): Payer: Self-pay

## 2022-11-05 ENCOUNTER — Ambulatory Visit (HOSPITAL_BASED_OUTPATIENT_CLINIC_OR_DEPARTMENT_OTHER): Payer: Medicare Other | Admitting: Orthopaedic Surgery

## 2022-11-05 DIAGNOSIS — Z9889 Other specified postprocedural states: Secondary | ICD-10-CM

## 2022-11-05 DIAGNOSIS — Z471 Aftercare following joint replacement surgery: Secondary | ICD-10-CM | POA: Diagnosis not present

## 2022-11-05 DIAGNOSIS — Z96611 Presence of right artificial shoulder joint: Secondary | ICD-10-CM | POA: Diagnosis not present

## 2022-11-05 NOTE — Progress Notes (Signed)
Post Operative Evaluation    Procedure/Date of Surgery: Right shoulder reverse shoulder arthroplasty 04/07/22  Interval History:    Presents today status post the above procedure.  Overall she is doing extremely well.  Her range of motion is continuing to improve.  She does still have a small amount of difficulty laying directly on the right shoulder although this is continuing to improve.  PMH/PSH/Family History/Social History/Meds/Allergies:    Past Medical History:  Diagnosis Date   Anemia    Anginal pain ([place])    last cp was last year   Anxiety    Arthritis    Blood transfusion    Chest pain 02/04/2016   Coronary artery disease    Coronary artery spasm, wth continued episodes of chest pain.  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 Sherwood Gambler   breast     right, tumor benign   BREAST EXCISIONAL BIOPSY Right    1961 (age 26) benign   CARDIAC CATHETERIZATION  2014   CARDIAC CATHETERIZATION  2012   CARDIAC CATHETERIZATION N/A 07/22/2016   Procedure: Left Heart Cath and Coronary Angiography;  Surgeon: Belva Crome, MD;  Location: [place] [facility];  Service: Cardiovascular;  Laterality: N/A;   cardiac stent  2012   to RCA   CHOLECYSTECTOMY N/A 10/02/2017   Procedure: LAPAROSCOPIC CHOLECYSTECTOMY;  Surgeon: Ralene Ok, MD;  Location: [place];  Service: General;  Laterality: N/A;   CORONARY ANGIOPLASTY     ESOPHAGEAL MANOMETRY N/A 10/29/2016   Procedure: ESOPHAGEAL MANOMETRY (EM);  Surgeon: Garlan Fair, MD;  Location: [facility];  Service: Endoscopy;  Laterality: N/A;   ESOPHAGOGASTRODUODENOSCOPY (EGD) WITH PROPOFOL N/A 10/28/2016   Procedure: ESOPHAGOGASTRODUODENOSCOPY (EGD) WITH PROPOFOL;  Surgeon: Garlan Fair, MD;  Location: [facility];  Service: Endoscopy;  Laterality: N/A;   EXCISION OF SKIN TAG N/A 07/29/2017   Procedure: EXCISION OF PERIANAL  SKIN TAG;  Surgeon: Ralene Ok, MD;  Location: [place];  Service: General;  Laterality: N/A;   KNEE SURGERY  left   left ear surgery     for bad cut   LEFT HEART CATH AND CORONARY ANGIOGRAPHY N/A 10/06/2022   Procedure: LEFT HEART CATH AND CORONARY ANGIOGRAPHY;  Surgeon: Lorretta Harp, MD;  Location: [place] [facility];  Service: Cardiovascular;  Laterality: N/A;   LEFT HEART CATHETERIZATION WITH CORONARY ANGIOGRAM N/A 02/22/2013   Procedure: LEFT HEART CATHETERIZATION WITH CORONARY ANGIOGRAM;  Surgeon: [name], MD;  Location: [facility];  Service: Cardiovascular;  Laterality: N/A;   REVERSE SHOULDER ARTHROPLASTY Right 04/07/2022   Procedure: RIGHT SHOULDER REVERSE SHOULDER ARTHROPLASTY;  Surgeon: Vanetta Mulders, MD;  Location: [place];  Service: Orthopedics;  Laterality: Right;   SHOULDER SURGERY Bilateral    rotator cuff   TOE SURGERY Left  toe surgery   Social History   Socioeconomic History   Marital status: Married    Spouse name: [name]   Number of children: 0   Years of education: Not on file   Highest education level: Not on file  Occupational History   Not on file  Tobacco Use   Smoking status: Every Day    Packs/day: 0.50    Years: 40.00    Additional pack years: 0.00    Total pack years: 20.00    Types: Cigarettes    Last attempt to quit: 01/10/2016    Years since quitting: 6.8   Smokeless tobacco: Never   Tobacco comments:    Pt reports she smokes a pack or less per week of cigarettes  Vaping Use   Vaping Use: Never used  Substance and Sexual Activity   Alcohol use: Yes     Alcohol/week: 1.0 standard drink of alcohol    Types: 1 Cans of beer per week    Comment: occasional beer   Drug use: No   Sexual activity: Never  Other Topics Concern   Not on file  Social History Narrative   Lives with husband. Ambulates independently.   Social Determinants of Health   Financial Resource Strain: Not on file  Food Insecurity: No Food Insecurity (10/05/2022)   Hunger Vital Sign    Worried About Running Out of Food in the Last Year: Never true    Ran Out of Food in the Last Year: Never true  Transportation Needs: No Transportation Needs (10/05/2022)   PRAPARE - Hydrologist (Medical): No    Lack of Transportation (Non-Medical): No  Physical Activity: Not on file  Stress: Not on file  Social Connections: Not on file   Family History  Problem Relation Age of Onset   Heart failure Mother    Stroke Sister    Heart attack Other    No Known Allergies Current Outpatient Medications  Medication Sig Dispense Refill   ACCU-CHEK GUIDE test strip USE TO CHECK BLOOD SUGAR DAILY     Accu-Chek Softclix Lancets lancets daily.     acetaminophen (TYLENOL) 500 MG tablet Take 500 mg by mouth every 6 (six) hours as needed for headache (pain).     amLODipine (NORVASC) 5 MG tablet Take 1 tablet (5 mg total) by mouth daily. 90 tablet 3   aspirin EC 325 MG tablet Take 1 tablet (325 mg total) by mouth daily. 30 tablet 0   isosorbide mononitrate (IMDUR) 30 MG 24 hr tablet Take 0.5 tablets (15 mg total) by mouth daily. 30 tablet 3   metFORMIN (GLUCOPHAGE-XR) 500 MG 24 hr tablet Take 500 mg by mouth 2 (two) times daily.     nitroGLYCERIN (NITROSTAT) 0.4 MG SL tablet PLACE 1 TABLET UNDER TONGUE AS NEEDED FOR CHEST PAIN EVERY 5 MINUTES UP TO 3 DOSES THEN SEEK MEDICAL ATTENTION. Please make overdue appt. 1st attempt 25 tablet 0   pantoprazole (PROTONIX) 40 MG tablet Take 1 tablet (40 mg total) by mouth 2 (two) times daily. 60 tablet 2   rosuvastatin (CRESTOR) 5 MG  tablet Take 1 tablet (5 mg total) by mouth daily at 6 PM. 30 tablet 1   traZODone (DESYREL) 50 MG tablet Take 50-100 mg by mouth at bedtime. 1 tablet at bedtime, if not working will take second tablet     No current facility-administered medications for this visit.   No results found.  Review of Systems:   A ROS  was performed including pertinent positives and negatives as documented in the HPI.   Musculoskeletal Exam:    There were no vitals taken for this visit.  Right incision is well-healed.  In the standing position she can forward elevate to 160 degrees compared to 170 on the left degrees and external rotation at the side is to 45 degrees which is equal to the contralateral side.  Internal rotation is to midline in the back.    Sensation is intact distally with 2+ radial pulse  Imaging:    3 views right shoulder: Status post right shoulder reverse shoulder arthroplasty without evidence of hardware complication  I personally reviewed and interpreted the radiographs.   Assessment:   77 year old female status post right reverse shoulder arthroplasty overall doing very well.  Range of motion has dramatically improved.  She is working on strengthening exercises as well which I would like her to progress with over the course the next several months.  I will plan to see her back in 3 months for likely final check Plan :    -Return to clinic in 12 weeks      I personally saw and evaluated the patient, and participated in the management and treatment plan.  Vanetta Mulders, MD Attending Physician, Orthopedic Surgery  This document was dictated using Dragon voice recognition software. A reasonable attempt at proof reading has been made to minimize errors.

## 2022-11-09 DIAGNOSIS — E118 Type 2 diabetes mellitus with unspecified complications: Secondary | ICD-10-CM | POA: Diagnosis not present

## 2022-11-12 ENCOUNTER — Other Ambulatory Visit (HOSPITAL_BASED_OUTPATIENT_CLINIC_OR_DEPARTMENT_OTHER): Payer: Self-pay | Admitting: Orthopaedic Surgery

## 2022-11-12 ENCOUNTER — Telehealth: Payer: Self-pay | Admitting: Orthopaedic Surgery

## 2022-11-12 MED ORDER — CYCLOBENZAPRINE HCL 10 MG PO TABS: 10.0000 mg | ORAL_TABLET | Freq: Three times a day (TID) | ORAL | 0 refills | Status: DC | PRN

## 2022-11-12 NOTE — Telephone Encounter (Signed)
Patient called. She would like a muscle relaxer called in for her. Her call back number is [phone]. [facility] on [address]

## 2022-11-18 ENCOUNTER — Telehealth: Payer: Self-pay | Admitting: Cardiology

## 2022-11-18 NOTE — Telephone Encounter (Signed)
Patient saw PA Jari Favre on 03/26 and ask for the patient to give a record of her blood pressure readings. Listed below are the dates and blood pressure reading for PA [facility].   03/27 134/72  03/28 122/69 03/29 103/66 03/30 112/77 03/31 106/67 04/01 130/71 04/02 136/38 04/03 137/38 04/04 119/70 04/05 118/69 04/06 129/76 04/07 132/77 04/08 116/70 04/09 127/75  Patient also wanted to ask about her lab work that needs to be scheduled. Patient would like to know if she needs to go ahead and schedule her lab work or if she should wait to do it right before her appt with Dr. Shari Prows on 07/01. Please advise.

## 2022-11-19 NOTE — Telephone Encounter (Signed)
Placed a return call back to the pt.   Pt is asking when she should come in for her labs that Jari Favre PA-C ordered on her at the last OV.   At last OV Tessa wanted her to come in sometime soon for lipids and LFTs.  Scheduled the pt to come into the office for lab work to check her lipids and LFTs for tomorrow 4/11.  She is aware to come fasting to this lab appt.   Pt verbalized understanding and agrees with this plan.

## 2022-11-20 ENCOUNTER — Ambulatory Visit: Payer: Medicare Other | Attending: Physician Assistant

## 2022-11-20 DIAGNOSIS — E785 Hyperlipidemia, unspecified: Secondary | ICD-10-CM

## 2022-11-20 DIAGNOSIS — G5601 Carpal tunnel syndrome, right upper limb: Secondary | ICD-10-CM | POA: Diagnosis not present

## 2022-11-20 DIAGNOSIS — I201 Angina pectoris with documented spasm: Secondary | ICD-10-CM

## 2022-11-20 DIAGNOSIS — I10 Essential (primary) hypertension: Secondary | ICD-10-CM

## 2022-11-20 DIAGNOSIS — I2510 Atherosclerotic heart disease of native coronary artery without angina pectoris: Secondary | ICD-10-CM

## 2022-11-21 LAB — HEPATIC FUNCTION PANEL
ALT: 12 IU/L (ref 0–32)
AST: 17 IU/L (ref 0–40)
Albumin: 4.3 g/dL (ref 3.8–4.8)
Alkaline Phosphatase: 168 IU/L — ABNORMAL HIGH (ref 44–121)
Bilirubin Total: <0.2 mg/dL (ref 0.0–1.2)
Bilirubin, Direct: <0.1 mg/dL (ref 0.00–0.40)
Total Protein: 6.9 g/dL (ref 6.0–8.5)

## 2022-11-21 LAB — LIPID PANEL
Chol/HDL Ratio: 1.8 ratio (ref 0.0–4.4)
Cholesterol, Total: 176 mg/dL (ref 100–199)
HDL: 97 mg/dL (ref >39)
LDL Chol Calc (NIH): 65 mg/dL (ref 0–99)
Triglycerides: 77 mg/dL (ref 0–149)
VLDL Cholesterol Cal: 14 mg/dL (ref 5–40)

## 2022-12-02 DIAGNOSIS — I700 Atherosclerosis of aorta: Secondary | ICD-10-CM | POA: Diagnosis not present

## 2022-12-02 DIAGNOSIS — I10 Essential (primary) hypertension: Secondary | ICD-10-CM | POA: Diagnosis not present

## 2022-12-02 DIAGNOSIS — E782 Mixed hyperlipidemia: Secondary | ICD-10-CM | POA: Diagnosis not present

## 2022-12-09 DIAGNOSIS — E118 Type 2 diabetes mellitus with unspecified complications: Secondary | ICD-10-CM | POA: Diagnosis not present

## 2022-12-10 ENCOUNTER — Ambulatory Visit: Payer: Medicare Other | Admitting: Interventional Cardiology

## 2023-02-04 NOTE — Progress Notes (Unsigned)
Cardiology Office Note:   Date:  02/09/2023  ID:  [name], DOB 03-16-1946, MRN [id number]  History of Present Illness:   [name] is a 77 y.o. female with history of CAD s/p RCA PCI, chronic systolic HF with mildly reduced EF, neurocardiogenic syncope, HTN, HLD, and GERD who was previously followed by Dr. Katrinka Blazing who now returns to clinic for follow-up.  Was admitted 09/2022 with episode of syncope while standing in church about to sing concerning for vagal event. Course complicated by chest pain with anterolateral TWI. Ultimately underwent cath 09/2022 which showed mild, nonobstructive disease. TTE 09/2022 with EF 45% with lateral akinesis. She was continued on medical management.  Was last seen in clinic by Jari Favre where she was having atypical sharp chest pain. Was otherwise doing well.   Today, the patient overall feels well. States her chest pain is significantly improved. No SOB, LE edema, orthopnea, or PND. No recurrence of syncope. Blood pressure is well controlled. Tolerating medications as prescribed. Admits she is not as active as she was but plans to go back to the gym.  Continues to smoke but is working on quit.  Past Medical History:  Diagnosis Date   Anemia    Anginal pain (HCC)    last cp was last year   Anxiety    Arthritis    Blood transfusion    Chest pain 02/04/2016   Coronary artery disease    Coronary artery spasm, wth continued episodes of chest pain.  09/26/2013   Coronary vasospasm (HCC)    Diabetes mellitus without complication (HCC)    type II   GERD (gastroesophageal reflux disease)    Headache    Hiatal hernia    Hypercholesteremia    Hypertension    NSTEMI (non-ST elevated myocardial infarction) (HCC) not sure   NSVT (nonsustained ventricular tachycardia) (HCC) 09/26/2013   Panic attack      ROS: As per HPI  Studies Reviewed:    EKG:  No new tracing  Cardiac Studies & Procedures   CARDIAC CATHETERIZATION  CARDIAC  CATHETERIZATION 10/06/2022  Narrative Images from the original result were not included.    Prox LAD lesion is 30% stenosed.   Ost 1st Diag lesion is 40% stenosed.   Prox RCA to Mid RCA lesion is 25% stenosed.  [name] is a 77 y.o. female   [id number] LOCATION:  FACILITY: [facility] PHYSICIAN: Nanetta Batty, M.D. 1946/02/01   DATE OF PROCEDURE:  10/06/2022  DATE OF DISCHARGE:     CARDIAC CATHETERIZATION    History obtained from chart review.77 y.o. female with a hx of CAD and known coronary spasm with recurrent chest pain and prior syncope who is being seen for CP and syncope.   Her troponins were negative.  Her EKG did show lateral T wave inversion.  Because of this, Dr. Shari Prows felt that it was prudent to pursue coronary angiography to rule out an ischemic etiology.  Impression [name] has a widely patent RCA stent and otherwise no significant CAD.  LVEDP was 15.  I do not think that her chest pain was ischemically mediated.  The sheath will be removed and pressure held in the groin to achieve hemostasis.  The right radial sheath was removed and a TR band was placed to achieve hemostasis.  The patient left lab in stable condition.  Dr. Shari Prows, the patient's attending cardiologist, was made aware of these results.  I anticipate discharge later today.  Nanetta Batty. MD, [facility] 10/06/2022 1:05 PM  Findings Coronary Findings Diagnostic  Dominance: Right  Left Anterior Descending Prox LAD lesion is 30% stenosed.  First Diagonal Branch Ost 1st Diag lesion is 40% stenosed.  Left Circumflex  Second Obtuse Marginal Branch Vessel is small in size.  Third Obtuse Marginal Branch Vessel is small in size.  Right Coronary Artery Prox RCA to Mid RCA lesion is 25% stenosed. The lesion was previously treated .  Intervention  No interventions have been documented.   CARDIAC CATHETERIZATION  CARDIAC CATHETERIZATION 07/22/2016  Narrative  The left  ventricular ejection fraction is 45-50% by visual estimate.  Ost 1st Diag lesion, 40 %stenosed.  Prox LAD lesion, 30 %stenosed.  Mid RCA lesion, 40 %stenosed.   Diffuse 30-40% narrowing in the mid right coronary/in-stent restenosis.  Otherwise widely patent coronaries with the exception of a 40-50% ostial diagonal 1.  Overall low normal LV function with EF of 50%.  Intense radial/brachial spasm during the case requiring that catheter size be downgraded to 4 [place].  RECOMMENDATIONS:   Continue medical therapy  Findings Coronary Findings Diagnostic  Dominance: Right  Left Anterior Descending  First Diagonal Branch  Left Circumflex  Second Obtuse Marginal Branch Vessel is small in size.  Third Obtuse Marginal Branch Vessel is small in size.  Right Coronary Artery The lesion was previously treated.  Intervention  No interventions have been documented.   STRESS TESTS  MYOCARDIAL PERFUSION IMAGING 07/03/2021  Narrative   Findings are consistent with prior myocardial infarction. The study is intermediate risk.   LV perfusion is abnormal. Defect 1: There is a medium sized defect with severe reduction in uptake present in the apical to basal anterolateral and inferolateral location(s) that is fixed. There is abnormal wall motion in the defect area. Consistent with infarction.   Left ventricular function is abnormal. Nuclear stress EF: 40 %. The left ventricular ejection fraction is moderately decreased (30-44%). End diastolic cavity size is normal.   No ST deviation was noted during stress test.   Compared to prior report in 2017, there is no significant change.   ECHOCARDIOGRAM  ECHOCARDIOGRAM COMPLETE 10/05/2022  Narrative ECHOCARDIOGRAM REPORT    Patient Name:   [name] Date of Exam: 10/05/2022 Medical Rec #:  [id number]       Height:       59.0 in Accession #:    [id number]      Weight:       112.0 lb Date of Birth:  06-27-1946       BSA:           1.442 m Patient Age:    76 years        BP:           109/61 mmHg Patient Gender: F               HR:           65 bpm. Exam Location:  Inpatient  Procedure: 2D Echo  Indications:    chest pain  History:        Patient has prior history of Echocardiogram examinations, most recent 08/29/2021. CAD, Signs/Symptoms:Syncope; Risk Factors:Current Smoker and Hypertension.  Sonographer:    Delcie Roch RDCS Referring Phys: [id number] [facility]   Sonographer Comments: Global longitudinal strain was attempted. IMPRESSIONS   1. Left ventricular ejection fraction, by estimation, is 45%. The left ventricle has mildly decreased function. The left ventricle demonstrates regional wall motion abnormalities (see scoring diagram/findings for description). Left ventricular diastolic parameters are consistent with  Grade I diastolic dysfunction (impaired relaxation). The average left ventricular global longitudinal strain is -13.0 %. The global longitudinal strain is abnormal. 2. Abnormal inferior and inferolateral regional strain. 3. Right ventricular systolic function is normal. The right ventricular size is normal. There is normal pulmonary artery systolic pressure. The estimated right ventricular systolic pressure is 17.0 mmHg. 4. The mitral valve is grossly normal. Trivial mitral valve regurgitation. 5. The aortic valve is tricuspid. Aortic valve regurgitation is not visualized. No aortic stenosis is present. 6. The inferior vena cava is normal in size with greater than 50% respiratory variability, suggesting right atrial pressure of 3 mmHg.  FINDINGS Left Ventricle: Left ventricular ejection fraction, by estimation, is 45%. The left ventricle has mildly decreased function. The left ventricle demonstrates regional wall motion abnormalities. The average left ventricular global longitudinal strain is -13.0 %. The global longitudinal strain is abnormal. The left ventricular internal cavity size was  normal in size. There is no left ventricular hypertrophy. Left ventricular diastolic parameters are consistent with Grade I diastolic dysfunction (impaired relaxation).   LV Wall Scoring: The entire lateral wall is akinetic.  Right Ventricle: The right ventricular size is normal. No increase in right ventricular wall thickness. Right ventricular systolic function is normal. There is normal pulmonary artery systolic pressure. The tricuspid regurgitant velocity is 1.87 m/s, and with an assumed right atrial pressure of 3 mmHg, the estimated right ventricular systolic pressure is 17.0 mmHg.  Left Atrium: Left atrial size was normal in size.  Right Atrium: Right atrial size was normal in size.  Pericardium: Trivial pericardial effusion is present. The pericardial effusion is posterior to the left ventricle.  Mitral Valve: 2D MVA 2.10 cm. The mitral valve is grossly normal. Trivial mitral valve regurgitation.  Tricuspid Valve: The tricuspid valve is grossly normal. Tricuspid valve regurgitation is not demonstrated. No evidence of tricuspid stenosis.  Aortic Valve: The aortic valve is tricuspid. Aortic valve regurgitation is not visualized. No aortic stenosis is present.  Pulmonic Valve: The pulmonic valve was not well visualized. Pulmonic valve regurgitation is not visualized. No evidence of pulmonic stenosis.  Aorta: The aortic root and ascending aorta are structurally normal, with no evidence of dilitation.  Venous: The inferior vena cava is normal in size with greater than 50% respiratory variability, suggesting right atrial pressure of 3 mmHg.  IAS/Shunts: No atrial level shunt detected by color flow Doppler.   LEFT VENTRICLE PLAX 2D LVIDd:         4.70 cm     Diastology LVIDs:         4.10 cm     LV e' medial:  6.53 cm/s LV PW:         0.90 cm     LV e' lateral: 9.46 cm/s LV IVS:        0.90 cm LVOT diam:     1.70 cm     2D Longitudinal Strain LV SV:         47          2D Strain  GLS Avg:     -13.0 % LV SV Index:   33 LVOT Area:     2.27 cm  LV Volumes (MOD) LV vol d, MOD A4C: 68.0 ml LV vol s, MOD A4C: 44.2 ml LV SV MOD A4C:     68.0 ml  RIGHT VENTRICLE             IVC RV Basal diam:  2.30 cm     IVC  diam: 1.20 cm RV S prime:     10.80 cm/s TAPSE (M-mode): 1.9 cm  LEFT ATRIUM             Index        RIGHT ATRIUM          Index LA diam:        3.80 cm 2.64 cm/m   RA Area:     9.10 cm LA Vol (A2C):   38.6 ml 26.78 ml/m  RA Volume:   17.70 ml 12.28 ml/m LA Vol (A4C):   28.0 ml 19.42 ml/m LA Biplane Vol: 33.6 ml 23.31 ml/m AORTIC VALVE LVOT Vmax:   102.00 cm/s LVOT Vmean:  63.400 cm/s LVOT VTI:    0.209 m  AORTA Ao Root diam: 2.60 cm Ao Asc diam:  2.50 cm  MITRAL VALVE                TRICUSPID VALVE MV Area (PHT): 3.03 cm     TR Peak grad:   14.0 mmHg MV Decel Time: 250 msec     TR Vmax:        187.00 cm/s MV A velocity: 132.00 cm/s SHUNTS Systemic VTI:  0.21 m Systemic Diam: 1.70 cm  Riley Lam MD Electronically signed by Riley Lam MD Signature Date/Time: 10/05/2022/6:19:20 PM    Final    MONITORS  LONG TERM MONITOR (3-14 DAYS) 10/30/2022  Narrative   Patch wear time was 13 days and 1 hour   Predominant rhythm is NSE with average HR 76bpm   There was 1 run of NSVT lasting 4 beats   3 runs of nonsustained SVT with longest lasting 4 beats   Rare SVE (<1%), rare VE (<1%)   Patient triggered events correlate with NSR and one PVC   Patch Wear Time:  13 days and 1 hours (2024-03-01T08:43:15-0500 to 2024-03-14T11:18:23-0400)  Patient had a min HR of 44 bpm, max HR of 165 bpm, and avg HR of 76 bpm. Predominant underlying rhythm was Sinus Rhythm. 1 run of Ventricular Tachycardia occurred lasting 4 beats with a max rate of 128 bpm (avg 120 bpm). 3 Supraventricular Tachycardia runs occurred, the run with the fastest interval lasting 4 beats with a max rate of 136 bpm (avg 127 bpm); the run with the fastest interval  was also the longest. Isolated SVEs were rare (<1.0%), SVE Couplets were rare (<1.0%), and no SVE Triplets were present. Isolated VEs were rare (<1.0%), VE Couplets were rare (<1.0%), and no VE Triplets were present. Ventricular Bigeminy and Trigeminy were present.  Laurance Flatten, MD   CT SCANS  CT CORONARY MORPH W/CTA COR W/SCORE 09/05/2021  Addendum 09/05/2021  3:44 PM ADDENDUM REPORT: 09/05/2021 15:42  HISTORY: Chest pain, nonspecific  EXAM: Cardiac/Coronary CT  TECHNIQUE: The patient was scanned on a [employer].  PROTOCOL: A 120 kV prospective scan was triggered in the descending thoracic aorta at 111 HU's. Axial non-contrast 3 mm slices were carried out through the heart. The data set was analyzed on a dedicated work station and scored using the Agatson method. Gantry rotation speed was 250 msecs and collimation was 0.6 mm. Heart rate optimized medically, and 0.8 mg of sublingual nitroglycerin was given. The 3D data set was reconstructed in 5% intervals of 35-75% of the R-R cycle. Diastolic phases were analyzed on a dedicated work station using MPR, MIP and VRT modes. The patient received 95mL OMNIPAQUE IOHEXOL 350 MG/ML SOLN of contrast.  FINDINGS: Coronary calcium score: The patient's coronary  artery calcium score is 408, which places the patient in the 89 percentile.  Coronary arteries: Normal coronary origins.  Right dominance.  Right Coronary Artery: Normal caliber vessel, gives rise to PDA. There is a stent in the mid RCA which appears largely patent but cannot be fully evaluated. Mixed calcified and noncalcified plaque in mid RCA with 25-49% stenosis.  Left Main Coronary Artery: Normal caliber vessel. No significant plaque or stenosis.  Left Anterior Descending Coronary Artery: Normal caliber vessel. Scattered calcified plaque throughout proximal LAD with maximum 25-49% stenosis. Gives rise to small first, large second and small third  diagonal branches. There is noncalcified plaque in the ostial D1 with 25-49% stenosis. Distal LAD wraps apex.  Left Circumflex Artery: Normal caliber vessel. Trivial calcified and noncalcified plaque in proximal LCx with 1-24% stenosis. Gives rise to 2 small OM branches.  Aorta: Normal size, 26 mm at the mid ascending aorta (level of the PA bifurcation) measured double oblique. Calcifications consistent with aortic atherosclerosis. No dissection seen in visualized portions of the aorta.  Aortic Valve: No calcifications. Trileaflet.  Other findings:  Normal pulmonary vein drainage into the left atrium.  Normal left atrial appendage without a thrombus.  Normal size of the pulmonary artery.  Normal appearance of the pericardium.  IMPRESSION: 1. 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 Presence of coronary stent in mid RCA.  2. Coronary calcium score of 408. This was 71 percentile for age and sex matched control.  3. Normal coronary origin with right dominance.  4.  Aortic atherosclerosis.  5. Wall motion evaluated through 35-75% of RR interval. Focal hypokinesis in the mid inferior, inferolateral, and anterolateral wall. Recommend correlation with other imaging modality.  INTERPRETATION:  1. CAD-RADS 0: No evidence of CAD (0%). Consider non-atherosclerotic causes of chest pain.  2. CAD-RADS 1: Minimal non-obstructive CAD (0-24%). Consider non-atherosclerotic causes of chest pain. Consider preventive therapy and risk factor modification.  3. CAD-RADS 2: Mild non-obstructive CAD (25-49%). Consider non-atherosclerotic causes of chest pain. Consider preventive therapy and risk factor modification.  4. CAD-RADS 3: Moderate stenosis (50-69%). Consider symptom-guided anti-ischemic pharmacotherapy as well as risk factor modification per guideline directed care. Additional analysis with CT FFR will be submitted.  5. CAD-RADS 4: Severe stenosis. (70-99% or > 50% left main).  Cardiac catheterization or CT FFR is recommended. Consider symptom-guided anti-ischemic pharmacotherapy as well as risk factor modification per guideline directed care. Invasive coronary angiography recommended with revascularization per published guideline statements.  6. CAD-RADS 5: Total coronary occlusion (100%). Consider cardiac catheterization or viability assessment. Consider symptom-guided anti-ischemic pharmacotherapy as well as risk factor modification per guideline directed care.  7. CAD-RADS N: Non-diagnostic study. Obstructive CAD can't be excluded. Alternative evaluation is recommended.   Electronically Signed By: Jodelle Red M.D. On: 09/05/2021 15:42  Narrative EXAM: OVER-READ INTERPRETATION  CT CHEST  The following report is an over-read performed by radiologist Dr. Trudie Reed of [facility] on 09/05/2021. This over-read does not include interpretation of cardiac or coronary anatomy or pathology. The coronary calcium score/coronary CTA interpretation by the cardiologist is attached.  COMPARISON:  Chest CT 01/23/2017.  FINDINGS: Atherosclerotic calcifications in the thoracic aorta. 5 mm right middle lobe pulmonary nodule (axial image 18 of series 11), stable compared to prior study from January 14, 2017, considered definitively benign. Within the visualized portions of the thorax there are no suspicious appearing pulmonary nodules or masses, there is no acute consolidative airspace disease, no pleural effusions, no pneumothorax and no lymphadenopathy. Visualized portions  of the upper abdomen are unremarkable. There are no aggressive appearing lytic or blastic lesions noted in the visualized portions of the skeleton.  IMPRESSION: 1.  Aortic Atherosclerosis (ICD10-I70.0).  Electronically Signed: By: Trudie Reed M.D. On: 09/05/2021 09:46           Risk Assessment/Calculations:              Physical Exam:   VS:  BP  112/60   Pulse 75   Ht 4\' 11"  (1.499 m)   Wt 125 lb 9.6 oz (57 kg)   SpO2 95%   BMI 25.37 kg/m    Wt Readings from Last 3 Encounters:  02/09/23 125 lb 9.6 oz (57 kg)  11/04/22 120 lb 6.4 oz (54.6 kg)  10/05/22 118 lb 11.2 oz (53.8 kg)     GEN: Well nourished, well developed in no acute distress NECK: No JVD; No carotid bruits CARDIAC: RRR, no murmurs, rubs, gallops RESPIRATORY:  Clear to auscultation without rales, wheezing or rhonchi  ABDOMEN: Soft, non-tender, non-distended EXTREMITIES:  No edema; No deformity   ASSESSMENT AND PLAN:   #Known CAD with prior RCA PCI: #Coronary Vasospasm: -Recent cath 09/2022 with widely patent RCA stent with mild disease elsewhere -Also with suspected vasospasm -Chest pain has significantly improved with addition of imdur/amlo -Continue ASA 81mg  daily -Continue crestor 5mg  daily -Continue imdur 15mg  daily -Continue amlodipine for suspected vasopasm -Continue to work on smoking cessation  #Chronic Systolic HF: -EF mildly reduced at 45% -Euvolemic and not on diuretics -Start spiro 12.5mg  daily -Did not tolerate farxiga due to yeast infections -Check BMET next week -GDMT limited due to soft blood pressures and chest pain much better on imdur/amlodipine so will continue for now  #HLD: -Continue crestor 5mg  daily -LDL 65, HDL 97, TG 77  #Syncope: -Episode thought to be vasovagal in nature -Cardiac monitor reassuring, TTE with stable EF and no significant valve disease  #Tobacco Abuse: -Encouraged cessation        Signed, Meriam Sprague, MD

## 2023-02-05 ENCOUNTER — Ambulatory Visit (INDEPENDENT_AMBULATORY_CARE_PROVIDER_SITE_OTHER): Payer: Medicare Other | Admitting: Orthopaedic Surgery

## 2023-02-05 ENCOUNTER — Ambulatory Visit (INDEPENDENT_AMBULATORY_CARE_PROVIDER_SITE_OTHER): Payer: Medicare Other

## 2023-02-05 DIAGNOSIS — M25711 Osteophyte, right shoulder: Secondary | ICD-10-CM | POA: Diagnosis not present

## 2023-02-05 DIAGNOSIS — Z96611 Presence of right artificial shoulder joint: Secondary | ICD-10-CM

## 2023-02-05 DIAGNOSIS — Z471 Aftercare following joint replacement surgery: Secondary | ICD-10-CM | POA: Diagnosis not present

## 2023-02-05 MED ORDER — METHOCARBAMOL 500 MG PO TABS: 500.0000 mg | ORAL_TABLET | Freq: Four times a day (QID) | ORAL | 3 refills | Status: DC

## 2023-02-05 NOTE — Progress Notes (Signed)
Post Operative Evaluation    Procedure/Date of Surgery: Right shoulder reverse shoulder arthroplasty 04/07/22  Interval History:    Presents today status post the above procedure.  Overall she is continuing to improve.  Range of motion is improving.  She does state that she does have some soreness and achiness when sleeping directly on this side.   PMH/PSH/Family History/Social History/Meds/Allergies:    Past Medical History:  Diagnosis Date   Anemia    Anginal pain (HCC)    last cp was last year   Anxiety    Arthritis    Blood transfusion    Chest pain 02/04/2016   Coronary artery disease    Coronary artery spasm, wth continued episodes of chest pain.  09/26/2013   Coronary vasospasm (HCC)    Diabetes mellitus without complication (HCC)    type II   GERD (gastroesophageal reflux disease)    Headache    Hiatal hernia    Hypercholesteremia    Hypertension    NSTEMI (non-ST elevated myocardial infarction) (HCC) not sure   NSVT (nonsustained ventricular tachycardia) (HCC) 09/26/2013   Panic attack    Past Surgical History:  Procedure Laterality Date   ABDOMINAL HYSTERECTOMY     PARTIAL HYSTERECTOMY   ACDF N/A    C5-6 ACDF Dr. Newell Coral   breast     right, tumor benign   BREAST EXCISIONAL BIOPSY Right    1961 (age 77) benign   CARDIAC CATHETERIZATION  2014   CARDIAC CATHETERIZATION  2012   CARDIAC CATHETERIZATION N/A 07/22/2016   Procedure: Left Heart Cath and Coronary Angiography;  Surgeon: Lyn Records, MD;  Location: [facility] INVASIVE CV LAB;  Service: Cardiovascular;  Laterality: N/A;   cardiac stent  2012   to RCA   CHOLECYSTECTOMY N/A 10/02/2017   Procedure: LAPAROSCOPIC CHOLECYSTECTOMY;  Surgeon: Axel Filler, MD;  Location: [facility] OR;  Service: General;  Laterality: N/A;   CORONARY ANGIOPLASTY     ESOPHAGEAL MANOMETRY N/A 10/29/2016   Procedure: ESOPHAGEAL MANOMETRY (EM);  Surgeon: Charolett Bumpers, MD;  Location: [facility] ENDOSCOPY;   Service: Endoscopy;  Laterality: N/A;   ESOPHAGOGASTRODUODENOSCOPY (EGD) WITH PROPOFOL N/A 10/28/2016   Procedure: ESOPHAGOGASTRODUODENOSCOPY (EGD) WITH PROPOFOL;  Surgeon: Charolett Bumpers, MD;  Location: [facility] ENDOSCOPY;  Service: Endoscopy;  Laterality: N/A;   EXCISION OF SKIN TAG N/A 07/29/2017   Procedure: EXCISION OF PERIANAL  SKIN TAG;  Surgeon: Axel Filler, MD;  Location: [facility] OR;  Service: General;  Laterality: N/A;   KNEE SURGERY  left   left ear surgery     for bad cut   LEFT HEART CATH AND CORONARY ANGIOGRAPHY N/A 10/06/2022   Procedure: LEFT HEART CATH AND CORONARY ANGIOGRAPHY;  Surgeon: Runell Gess, MD;  Location: [facility] INVASIVE CV LAB;  Service: Cardiovascular;  Laterality: N/A;   LEFT HEART CATHETERIZATION WITH CORONARY ANGIOGRAM N/A 02/22/2013   Procedure: LEFT HEART CATHETERIZATION WITH CORONARY ANGIOGRAM;  Surgeon: Lesleigh Noe, MD;  Location: [facility] CATH LAB;  Service: Cardiovascular;  Laterality: N/A;   REVERSE SHOULDER ARTHROPLASTY Right 04/07/2022   Procedure: RIGHT SHOULDER REVERSE SHOULDER ARTHROPLASTY;  Surgeon: Huel Cote, MD;  Location: [facility] OR;  Service: Orthopedics;  Laterality: Right;   SHOULDER SURGERY Bilateral    rotator cuff   TOE SURGERY Left    toe surgery   Social History  Socioeconomic History   Marital status: Married    Spouse name: Jerolyn Shin   Number of children: 0   Years of education: Not on file   Highest education level: Not on file  Occupational History   Not on file  Tobacco Use   Smoking status: Every Day    Packs/day: 0.50    Years: 40.00    Additional pack years: 0.00    Total pack years: 20.00    Types: Cigarettes    Last attempt to quit: 01/10/2016    Years since quitting: 7.0   Smokeless tobacco: Never   Tobacco comments:    Pt reports she smokes a pack or less per week of cigarettes  Vaping Use   Vaping Use: Never used  Substance and Sexual Activity   Alcohol use: Yes    Alcohol/week: 1.0 standard drink of alcohol     Types: 1 Cans of beer per week    Comment: occasional beer   Drug use: No   Sexual activity: Never  Other Topics Concern   Not on file  Social History Narrative   Lives with husband. Ambulates independently.   Social Determinants of Health   Financial Resource Strain: Not on file  Food Insecurity: No Food Insecurity (10/05/2022)   Hunger Vital Sign    Worried About Running Out of Food in the Last Year: Never true    Ran Out of Food in the Last Year: Never true  Transportation Needs: No Transportation Needs (10/05/2022)   PRAPARE - Administrator, Civil Service (Medical): No    Lack of Transportation (Non-Medical): No  Physical Activity: Not on file  Stress: Not on file  Social Connections: Not on file   Family History  Problem Relation Age of Onset   Heart failure Mother    Stroke Sister    Heart attack Other    No Known Allergies Current Outpatient Medications  Medication Sig Dispense Refill   ACCU-CHEK GUIDE test strip USE TO CHECK BLOOD SUGAR DAILY     Accu-Chek Softclix Lancets lancets daily.     acetaminophen (TYLENOL) 500 MG tablet Take 500 mg by mouth every 6 (six) hours as needed for headache (pain).     amLODipine (NORVASC) 5 MG tablet Take 1 tablet (5 mg total) by mouth daily. 90 tablet 3   aspirin EC 325 MG tablet Take 1 tablet (325 mg total) by mouth daily. 30 tablet 0   cyclobenzaprine (FLEXERIL) 10 MG tablet Take 1 tablet (10 mg total) by mouth 3 (three) times daily as needed for muscle spasms. 30 tablet 0   isosorbide mononitrate (IMDUR) 30 MG 24 hr tablet Take 0.5 tablets (15 mg total) by mouth daily. 30 tablet 3   metFORMIN (GLUCOPHAGE-XR) 500 MG 24 hr tablet Take 500 mg by mouth 2 (two) times daily.     nitroGLYCERIN (NITROSTAT) 0.4 MG SL tablet PLACE 1 TABLET UNDER TONGUE AS NEEDED FOR CHEST PAIN EVERY 5 MINUTES UP TO 3 DOSES THEN SEEK MEDICAL ATTENTION. Please make overdue appt. 1st attempt 25 tablet 0   pantoprazole (PROTONIX) 40 MG tablet Take  1 tablet (40 mg total) by mouth 2 (two) times daily. 60 tablet 2   rosuvastatin (CRESTOR) 5 MG tablet Take 1 tablet (5 mg total) by mouth daily at 6 PM. 30 tablet 1   traZODone (DESYREL) 50 MG tablet Take 50-100 mg by mouth at bedtime. 1 tablet at bedtime, if not working will take second tablet     No  current facility-administered medications for this visit.   No results found.  Review of Systems:   A ROS was performed including pertinent positives and negatives as documented in the HPI.   Musculoskeletal Exam:    There were no vitals taken for this visit.  Right incision is well-healed.  In the standing position she can forward elevate to 160 degrees compared to 170 on the left degrees and external rotation at the side is to 45 degrees which is equal to the contralateral side.  Internal rotation is to midline in the back.    Sensation is intact distally with 2+ radial pulse  Imaging:    3 views right shoulder: Status post right shoulder reverse shoulder arthroplasty without evidence of hardware complication  I personally reviewed and interpreted the radiographs.   Assessment:   77 year old female status post right reverse shoulder arthroplasty overall doing very well.  She is still having some achiness with sleeping directly on the side.  I have advised that this can take many months to resolve.  I will plan to prescribe her an additional prescription of Robaxin.  At this time I will plan to see her as needed she will contact me in 2 months if not feeling 95% or better Plan :    -Return to clinic as needed      I personally saw and evaluated the patient, and participated in the management and treatment plan.  Huel Cote, MD Attending Physician, Orthopedic Surgery  This document was dictated using Dragon voice recognition software. A reasonable attempt at proof reading has been made to minimize errors.

## 2023-02-09 ENCOUNTER — Encounter: Payer: Self-pay | Admitting: Cardiology

## 2023-02-09 ENCOUNTER — Ambulatory Visit: Payer: Medicare Other | Attending: Cardiology | Admitting: Cardiology

## 2023-02-09 VITALS — BP 112/60 | HR 75 | Ht 59.0 in | Wt 125.6 lb

## 2023-02-09 DIAGNOSIS — R55 Syncope and collapse: Secondary | ICD-10-CM

## 2023-02-09 DIAGNOSIS — I25118 Atherosclerotic heart disease of native coronary artery with other forms of angina pectoris: Secondary | ICD-10-CM

## 2023-02-09 DIAGNOSIS — I10 Essential (primary) hypertension: Secondary | ICD-10-CM | POA: Diagnosis not present

## 2023-02-09 DIAGNOSIS — I25111 Atherosclerotic heart disease of native coronary artery with angina pectoris with documented spasm: Secondary | ICD-10-CM | POA: Diagnosis not present

## 2023-02-09 DIAGNOSIS — Z720 Tobacco use: Secondary | ICD-10-CM | POA: Diagnosis not present

## 2023-02-09 DIAGNOSIS — I2510 Atherosclerotic heart disease of native coronary artery without angina pectoris: Secondary | ICD-10-CM

## 2023-02-09 DIAGNOSIS — I201 Angina pectoris with documented spasm: Secondary | ICD-10-CM

## 2023-02-09 DIAGNOSIS — E785 Hyperlipidemia, unspecified: Secondary | ICD-10-CM | POA: Diagnosis not present

## 2023-02-09 DIAGNOSIS — I5022 Chronic systolic (congestive) heart failure: Secondary | ICD-10-CM | POA: Diagnosis not present

## 2023-02-09 MED ORDER — SPIRONOLACTONE 25 MG PO TABS: 12.5000 mg | ORAL_TABLET | Freq: Every day | ORAL | 3 refills | Status: DC

## 2023-02-09 NOTE — Patient Instructions (Signed)
Medication Instructions:  Your physician has recommended you make the following change in your medication:   Start taking spironolactone 12.5 mg (1/2 tablet ) daily  *If you need a refill on your cardiac medications before your next appointment, please call your pharmacy*   Lab Work: BMET 1 week If you have labs (blood work) drawn today and your tests are completely normal, you will receive your results only by: MyChart Message (if you have MyChart) OR A paper copy in the mail If you have any lab test that is abnormal or we need to change your treatment, we will call you to review the results.   Follow-Up: At [facility], you and your health needs are our priority.  As part of our continuing mission to provide you with exceptional heart care, we have created designated Provider Care Teams.  These Care Teams include your primary Cardiologist (physician) and Advanced Practice Providers (APPs -  Physician Assistants and Nurse Practitioners) who all work together to provide you with the care you need, when you need it.   Your next appointment:   6 month(s)  Provider:   Dr Clifton James

## 2023-02-16 ENCOUNTER — Ambulatory Visit: Payer: Medicare Other | Attending: Cardiology

## 2023-02-16 DIAGNOSIS — I10 Essential (primary) hypertension: Secondary | ICD-10-CM

## 2023-02-16 LAB — BASIC METABOLIC PANEL
BUN/Creatinine Ratio: 16 (ref 12–28)
BUN: 11 mg/dL (ref 8–27)
CO2: 24 mmol/L (ref 20–29)
Calcium: 10 mg/dL (ref 8.7–10.3)
Chloride: 101 mmol/L (ref 96–106)
Creatinine, Ser: 0.69 mg/dL (ref 0.57–1.00)
Glucose: 125 mg/dL — ABNORMAL HIGH (ref 70–99)
Potassium: 4.1 mmol/L (ref 3.5–5.2)
Sodium: 141 mmol/L (ref 134–144)
eGFR: 90 mL/min/{1.73_m2} (ref >59)

## 2023-04-02 DIAGNOSIS — R252 Cramp and spasm: Secondary | ICD-10-CM | POA: Diagnosis not present

## 2023-04-02 DIAGNOSIS — M5416 Radiculopathy, lumbar region: Secondary | ICD-10-CM | POA: Diagnosis not present

## 2023-04-02 DIAGNOSIS — Z0000 Encounter for general adult medical examination without abnormal findings: Secondary | ICD-10-CM | POA: Diagnosis not present

## 2023-04-15 DIAGNOSIS — Z0000 Encounter for general adult medical examination without abnormal findings: Secondary | ICD-10-CM | POA: Diagnosis not present

## 2023-04-15 DIAGNOSIS — M519 Unspecified thoracic, thoracolumbar and lumbosacral intervertebral disc disorder: Secondary | ICD-10-CM | POA: Diagnosis not present

## 2023-04-15 DIAGNOSIS — I10 Essential (primary) hypertension: Secondary | ICD-10-CM | POA: Diagnosis not present

## 2023-04-15 DIAGNOSIS — E782 Mixed hyperlipidemia: Secondary | ICD-10-CM | POA: Diagnosis not present

## 2023-04-15 DIAGNOSIS — I25111 Atherosclerotic heart disease of native coronary artery with angina pectoris with documented spasm: Secondary | ICD-10-CM | POA: Diagnosis not present

## 2023-04-15 DIAGNOSIS — I700 Atherosclerosis of aorta: Secondary | ICD-10-CM | POA: Diagnosis not present

## 2023-04-15 DIAGNOSIS — E1151 Type 2 diabetes mellitus with diabetic peripheral angiopathy without gangrene: Secondary | ICD-10-CM | POA: Diagnosis not present

## 2023-04-15 DIAGNOSIS — M8588 Other specified disorders of bone density and structure, other site: Secondary | ICD-10-CM | POA: Diagnosis not present

## 2023-04-15 DIAGNOSIS — D509 Iron deficiency anemia, unspecified: Secondary | ICD-10-CM | POA: Diagnosis not present

## 2023-04-16 ENCOUNTER — Telehealth (HOSPITAL_BASED_OUTPATIENT_CLINIC_OR_DEPARTMENT_OTHER): Payer: Self-pay | Admitting: Orthopaedic Surgery

## 2023-04-16 ENCOUNTER — Encounter (HOSPITAL_BASED_OUTPATIENT_CLINIC_OR_DEPARTMENT_OTHER): Payer: Self-pay | Admitting: Orthopaedic Surgery

## 2023-04-16 NOTE — Telephone Encounter (Signed)
Patient needs a note sent to dentist since she had a shoulder replacement so she can take antibiotics Dentist  stacey green  [phone]  (fax) [phone]

## 2023-04-16 NOTE — Telephone Encounter (Signed)
Note faxed to [phone]

## 2023-05-07 ENCOUNTER — Telehealth: Payer: Self-pay | Admitting: Orthopaedic Surgery

## 2023-05-07 NOTE — Telephone Encounter (Signed)
Patient called and said she needs medication for her right shoulder. [phone]

## 2023-05-08 ENCOUNTER — Other Ambulatory Visit (HOSPITAL_BASED_OUTPATIENT_CLINIC_OR_DEPARTMENT_OTHER): Payer: Self-pay | Admitting: Orthopaedic Surgery

## 2023-05-08 MED ORDER — MELOXICAM 15 MG PO TABS: 15.0000 mg | ORAL_TABLET | Freq: Every day | ORAL | 2 refills | Status: DC

## 2023-05-08 NOTE — Telephone Encounter (Signed)
Called and informed pt med sent

## 2023-07-26 ENCOUNTER — Other Ambulatory Visit (HOSPITAL_BASED_OUTPATIENT_CLINIC_OR_DEPARTMENT_OTHER): Payer: Self-pay | Admitting: Orthopaedic Surgery

## 2023-08-19 ENCOUNTER — Other Ambulatory Visit: Payer: Self-pay | Admitting: Internal Medicine

## 2023-08-19 DIAGNOSIS — Z1231 Encounter for screening mammogram for malignant neoplasm of breast: Secondary | ICD-10-CM

## 2023-08-24 ENCOUNTER — Ambulatory Visit: Payer: Medicare Other

## 2023-08-25 DIAGNOSIS — K224 Dyskinesia of esophagus: Secondary | ICD-10-CM | POA: Diagnosis not present

## 2023-08-25 DIAGNOSIS — I5022 Chronic systolic (congestive) heart failure: Secondary | ICD-10-CM | POA: Diagnosis not present

## 2023-08-25 DIAGNOSIS — I25111 Atherosclerotic heart disease of native coronary artery with angina pectoris with documented spasm: Secondary | ICD-10-CM | POA: Diagnosis not present

## 2023-08-25 DIAGNOSIS — M5090 Cervical disc disorder, unspecified, unspecified cervical region: Secondary | ICD-10-CM | POA: Diagnosis not present

## 2023-08-25 DIAGNOSIS — I700 Atherosclerosis of aorta: Secondary | ICD-10-CM | POA: Diagnosis not present

## 2023-08-25 DIAGNOSIS — K5904 Chronic idiopathic constipation: Secondary | ICD-10-CM | POA: Diagnosis not present

## 2023-08-25 DIAGNOSIS — G4700 Insomnia, unspecified: Secondary | ICD-10-CM | POA: Diagnosis not present

## 2023-08-25 DIAGNOSIS — E1151 Type 2 diabetes mellitus with diabetic peripheral angiopathy without gangrene: Secondary | ICD-10-CM | POA: Diagnosis not present

## 2023-08-25 DIAGNOSIS — E782 Mixed hyperlipidemia: Secondary | ICD-10-CM | POA: Diagnosis not present

## 2023-08-25 DIAGNOSIS — I739 Peripheral vascular disease, unspecified: Secondary | ICD-10-CM | POA: Diagnosis not present

## 2023-08-25 DIAGNOSIS — I10 Essential (primary) hypertension: Secondary | ICD-10-CM | POA: Diagnosis not present

## 2023-08-29 IMAGING — MG MM DIGITAL DIAGNOSTIC UNILAT*R* W/ TOMO W/ CAD
6 series · 6 of 18 positions shown · non-contrast
Comparison: Previous exam(s).

CLINICAL DATA: Patient returns today to evaluate a possible RIGHT
breast asymmetry questioned on recent screening mammogram.

EXAM:
DIGITAL DIAGNOSTIC UNILATERAL RIGHT MAMMOGRAM WITH TOMOSYNTHESIS AND
CAD
TECHNIQUE: Right digital diagnostic mammography and breast tomosynthesis was
performed. The images were evaluated with computer-aided detection.

[R MLO synth-2D (1 of 2)]
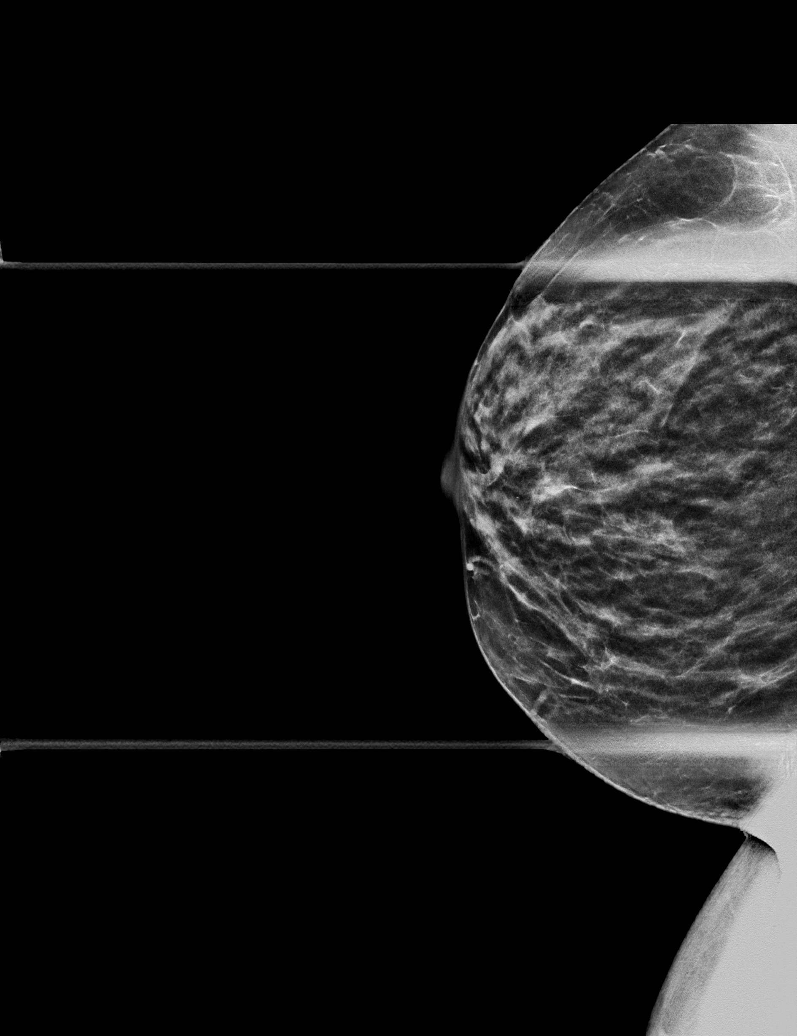

[R ML synth-2D]
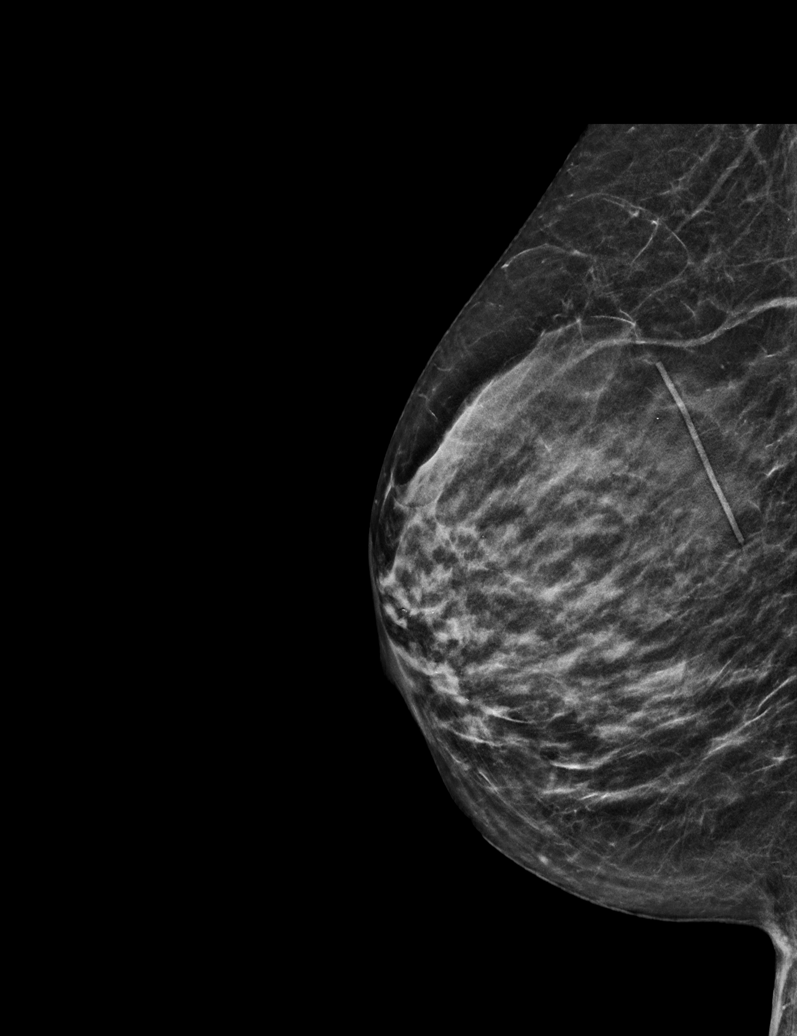

[R MLO synth-2D (2 of 2)]
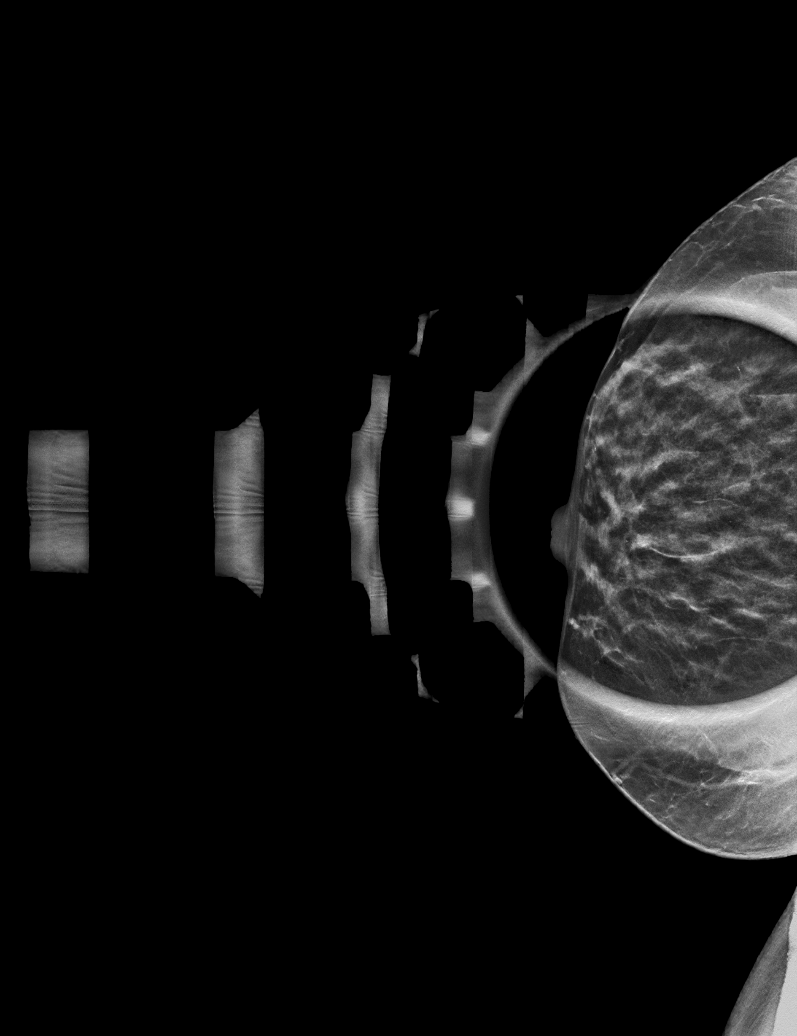

[R MLO tomo (1 of 2) · tomo slice 19/36.0]
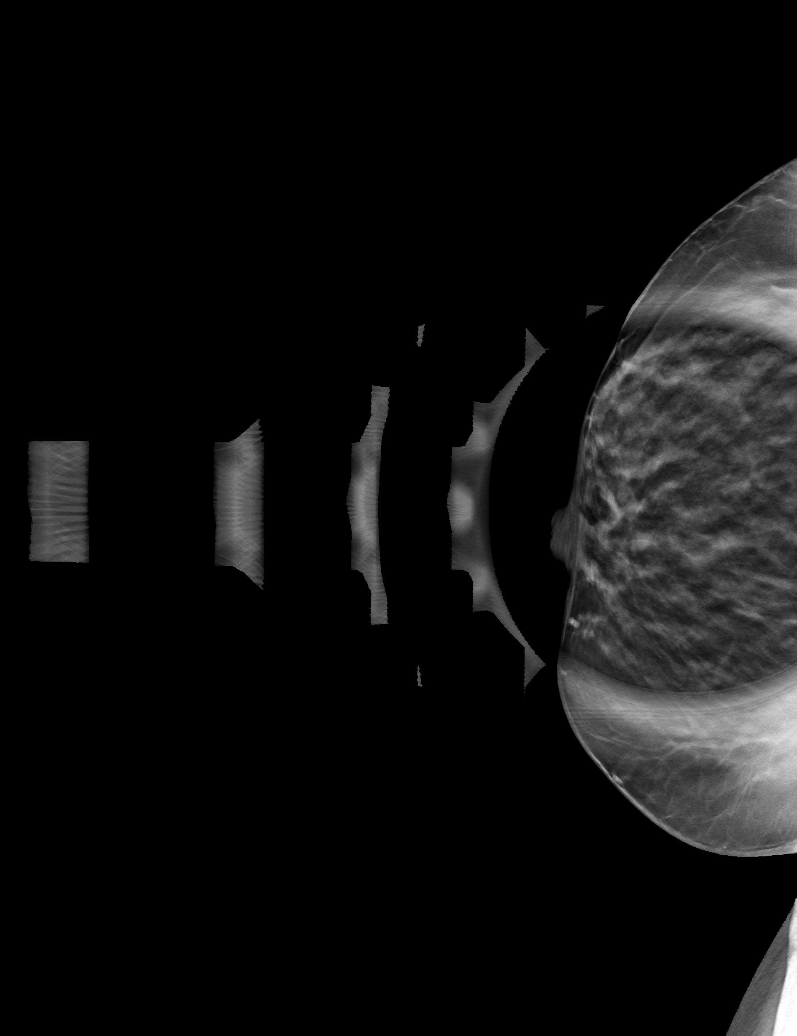

[R MLO tomo (2 of 2) · tomo slice 20/39.0]
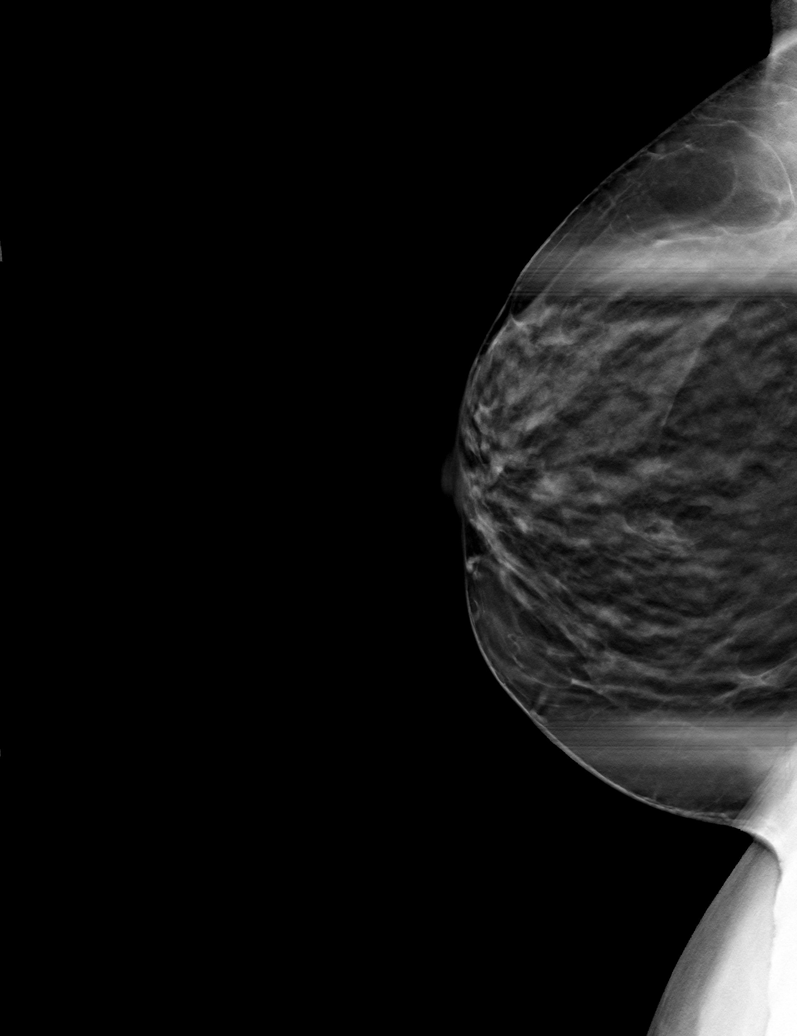

[R ML tomo · tomo slice 21/41.0]
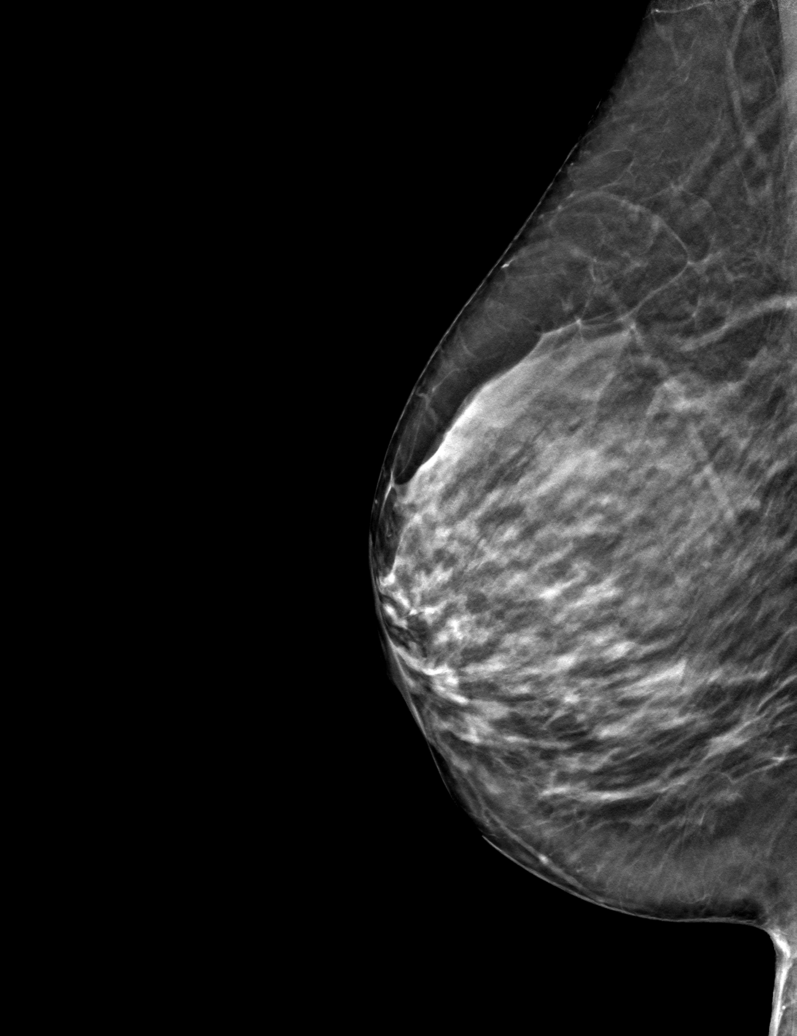

[6 of 18 positions shown; findings below may reference images not displayed]

ACR Breast Density Category c: The breast tissue is heterogeneously
dense, which may obscure small masses.
FINDINGS: On today's additional diagnostic views, including spot compression
with 3D tomosynthesis, there is no persistent asymmetry within the
RIGHT breast indicating superimposition of normal dense
fibroglandular tissues.
IMPRESSION: No evidence of malignancy.

Patient may return to routine annual bilateral screening mammogram
schedule.

RECOMMENDATION:
Screening mammogram in one year.(Code:[id number])

I have discussed the findings and recommendations with the patient.
If applicable, a reminder letter will be sent to the patient
regarding the next appointment.

BI-RADS CATEGORY  1: Negative.

## 2023-09-03 ENCOUNTER — Ambulatory Visit
Admission: RE | Admit: 2023-09-03 | Discharge: 2023-09-03 | Disposition: A | Discharge status: Home / Self Care | Payer: Medicare Other | Source: Ambulatory Visit | Attending: Internal Medicine | Admitting: Internal Medicine

## 2023-09-03 DIAGNOSIS — Z1231 Encounter for screening mammogram for malignant neoplasm of breast: Secondary | ICD-10-CM

## 2023-09-19 ENCOUNTER — Other Ambulatory Visit: Payer: Self-pay

## 2023-09-19 ENCOUNTER — Encounter (HOSPITAL_COMMUNITY): Payer: Self-pay

## 2023-09-19 ENCOUNTER — Emergency Department (HOSPITAL_COMMUNITY)
Admission: EM | Admit: 2023-09-19 | Discharge: 2023-09-19 | Disposition: A | Discharge status: Home / Self Care | Payer: Medicare Other

## 2023-09-19 DIAGNOSIS — M545 Low back pain, unspecified: Secondary | ICD-10-CM | POA: Insufficient documentation

## 2023-09-19 DIAGNOSIS — Z7982 Long term (current) use of aspirin: Secondary | ICD-10-CM | POA: Diagnosis not present

## 2023-09-19 DIAGNOSIS — M5459 Other low back pain: Secondary | ICD-10-CM | POA: Diagnosis not present

## 2023-09-19 MED ORDER — ACETAMINOPHEN 325 MG PO TABS
650.0000 mg | ORAL_TABLET | Freq: Once | ORAL | Status: AC
  Administered 2023-09-19: 650 mg via ORAL
  Filled 2023-09-19: qty 2

## 2023-09-19 MED ORDER — TRAMADOL HCL 50 MG PO TABS: 50.0000 mg | ORAL_TABLET | Freq: Four times a day (QID) | ORAL | 0 refills | Status: AC | PRN

## 2023-09-19 MED ORDER — LIDOCAINE 5 % EX PTCH
1.0000 | MEDICATED_PATCH | Freq: Once | CUTANEOUS | Status: DC
  Administered 2023-09-19: 1 via TRANSDERMAL
  Filled 2023-09-19: qty 1

## 2023-09-19 MED ORDER — LIDOCAINE 4 % EX PTCH: 1.0000 | MEDICATED_PATCH | CUTANEOUS | 0 refills | Status: DC

## 2023-09-19 MED ORDER — OXYCODONE-ACETAMINOPHEN 5-325 MG PO TABS: 1.0000 | ORAL_TABLET | Freq: Once | ORAL | Status: DC

## 2023-09-19 MED ORDER — METHOCARBAMOL 500 MG PO TABS: 500.0000 mg | ORAL_TABLET | Freq: Two times a day (BID) | ORAL | 0 refills | Status: AC | PRN

## 2023-09-19 MED ORDER — METHOCARBAMOL 500 MG PO TABS
500.0000 mg | ORAL_TABLET | Freq: Once | ORAL | Status: AC
  Administered 2023-09-19: 500 mg via ORAL
  Filled 2023-09-19: qty 1

## 2023-09-19 NOTE — Discharge Instructions (Addendum)
 May continue to take over-the-counter Tylenol  for baseline pain.  We are prescribing lidocaine  patches as well.  Please use the muscle relaxers and tramadol  for breakthrough pain.  Please use caution as these medications can predispose your age group to falls.  Please follow-up with your primary doctor.  Return immediately for fevers, chills, severe pain, numbness in your genital area, weakness in your lower extremities, bowel or bladder incontinence or any new or worsening symptoms that are concerning to you.

## 2023-09-19 NOTE — ED Triage Notes (Signed)
 Complains of left lower back pain that started on Wednesday.  Reports difficulty with any movement due to pain.  Denies numbnes tingling or bowel or  bladder loss.  Denies urinary symptoms or lifting injury.

## 2023-09-19 NOTE — ED Provider Notes (Signed)
 [place] EMERGENCY DEPARTMENT AT [facility] Provider Note   CSN: [id number] Arrival date & time: 09/19/23  1045     History  Chief Complaint  Patient presents with   Back Pain    [name] is a 78 y.o. female.  78 year old female presenting emergency department for low back pain.  Symptoms started Thursday.  No inciting event although she did vacuum the house which she states might of contributed to it.  No fevers, numbness in lower extremity, numbness in genital area, no bowel or bladder incontinence.  Not taking immunosuppressive's.  No history of cancer.  Pain is left lower back.  Nonradiating.   Back Pain      Home Medications Prior to Admission medications   Medication Sig Start Date End Date Taking? Authorizing Provider  lidocaine  4 % Place 1 patch onto the skin daily. 09/19/23  Yes Neysa Caron PARAS, DO  methocarbamol  (ROBAXIN ) 500 MG tablet Take 1 tablet (500 mg total) by mouth 2 (two) times daily as needed for up to 5 days for muscle spasms. 09/19/23 09/24/23 Yes Osama Coleson, Caron PARAS, DO  traMADol  (ULTRAM ) 50 MG tablet Take 1 tablet (50 mg total) by mouth every 6 (six) hours as needed for up to 3 days. 09/19/23 09/22/23 Yes Ottis Sarnowski, Caron PARAS, DO  ACCU-CHEK GUIDE test strip USE TO CHECK BLOOD SUGAR DAILY 11/05/20   [provider]  Accu-Chek Softclix Lancets lancets daily. 11/05/20   [provider]  acetaminophen  (TYLENOL ) 500 MG tablet Take 500 mg by mouth every 6 (six) hours as needed for headache (pain).    [provider]  amLODipine  (NORVASC ) 5 MG tablet Take 1 tablet (5 mg total) by mouth daily. 11/04/22   Lucien Orren SAILOR, PA-C  aspirin  EC 325 MG tablet Take 1 tablet (325 mg total) by mouth daily. 04/07/22   Genelle Standing, MD  isosorbide  mononitrate (IMDUR ) 30 MG 24 hr tablet Take 0.5 tablets (15 mg total) by mouth daily. 10/07/22   Patsy Lenis, MD  meloxicam  (MOBIC ) 15 MG tablet TAKE 1 TABLET(15 MG) BY MOUTH DAILY 07/29/23   Genelle Standing, MD  metFORMIN (GLUCOPHAGE-XR) 500 MG 24 hr tablet Take 500 mg by mouth 2 (two) times daily. 06/28/20   [provider]  methocarbamol  (ROBAXIN ) 500 MG tablet Take 1 tablet (500 mg total) by mouth 4 (four) times daily. 02/05/23   Genelle Standing, MD  nitroGLYCERIN  (NITROSTAT ) 0.4 MG SL tablet PLACE 1 TABLET UNDER TONGUE AS NEEDED FOR CHEST PAIN EVERY 5 MINUTES UP TO 3 DOSES THEN SEEK MEDICAL ATTENTION. Please make overdue appt. 1st attempt 02/18/19   Claudene Victory ORN, MD  pantoprazole  (PROTONIX ) 40 MG tablet Take 1 tablet (40 mg total) by mouth 2 (two) times daily. 04/20/14   Marylu Leita SAUNDERS, NP  rosuvastatin  (CRESTOR ) 5 MG tablet Take 1 tablet (5 mg total) by mouth daily at 6 PM. 01/24/17   Sebastian Toribio GAILS, MD  spironolactone  (ALDACTONE ) 25 MG tablet Take 0.5 tablets (12.5 mg total) by mouth daily. 02/09/23 05/10/23  Hobart Powell BRAVO, MD  traZODone  (DESYREL ) 50 MG tablet Take 50-100 mg by mouth at bedtime. 1 tablet at bedtime, if not working will take second tablet    [provider]      Allergies    Patient has no known allergies.    Review of Systems   Review of Systems  Musculoskeletal:  Positive for back pain.    Physical Exam Updated Vital Signs BP 132/76 (BP Location: Left  Arm)   Pulse (!) 52   Temp 98 F (36.7 C)   Resp 18   Ht 4' 11 (1.499 m)   Wt 56.7 kg   SpO2 100%   BMI 25.25 kg/m  Physical Exam Vitals and nursing note reviewed.  HENT:     Head: Normocephalic.     Mouth/Throat:     Mouth: Mucous membranes are moist.  Eyes:     Conjunctiva/sclera: Conjunctivae normal.  Cardiovascular:     Rate and Rhythm: Normal rate.  Pulmonary:     Effort: Pulmonary effort is normal.  Musculoskeletal:     Comments: No midline spinal tenderness.  Does have some tenderness along the left iliac crest.  5 out of 5 plantarflexion and dorsiflexion.  5 out of 5 hip extension.  Neurovascular intact in lower extremities.  Neurological:     Mental Status: She  is alert.     ED Results / Procedures / Treatments   Labs (all labs ordered are listed, but only abnormal results are displayed) Labs Reviewed - No data to display  EKG None  Radiology No results found.  Procedures Procedures    Medications Ordered in ED Medications  acetaminophen  (TYLENOL ) tablet 650 mg (650 mg Oral Given 09/19/23 1212)  methocarbamol  (ROBAXIN ) tablet 500 mg (500 mg Oral Given 09/19/23 1211)    ED Course/ Medical Decision Making/ A&P Clinical Course as of 09/19/23 2047  Sat Sep 19, 2023  1152 Has visit 04/02/23 with diagnosis of radicular low back pain per my chart review.  [TY]    Clinical Course User Index [TY] Neysa Caron PARAS, DO                                 Medical Decision Making Well-appearing 78 year old female presenting emergency department for low back pain.  She is afebrile nontachycardic reassuring exam with no red flags.  No red flags on history.  Considered imaging with CT scan or MRI.  However given lack of red flags and reassuring exam will forego imaging.  Treated supportively with some improvement of symptoms.  Will discharge with pain medications.  Follow-up with primary doctor.  Stable for discharge.  Risk OTC drugs. Prescription drug management.         Final Clinical Impression(s) / ED Diagnoses Final diagnoses:  Acute left-sided low back pain without sciatica    Rx / DC Orders ED Discharge Orders          Ordered    traMADol  (ULTRAM ) 50 MG tablet  Every 6 hours PRN        09/19/23 1256    lidocaine  4 %  Every 24 hours        09/19/23 1256    methocarbamol  (ROBAXIN ) 500 MG tablet  2 times daily PRN        09/19/23 1256              Neysa Caron PARAS, DO 09/19/23 2047

## 2023-09-25 ENCOUNTER — Other Ambulatory Visit: Payer: Self-pay | Admitting: Physician Assistant

## 2023-09-25 ENCOUNTER — Ambulatory Visit
Admission: RE | Admit: 2023-09-25 | Discharge: 2023-09-25 | Disposition: A | Discharge status: Home / Self Care | Payer: Medicare Other | Source: Ambulatory Visit | Attending: Physician Assistant | Admitting: Physician Assistant

## 2023-09-25 DIAGNOSIS — M25552 Pain in left hip: Secondary | ICD-10-CM

## 2023-09-25 DIAGNOSIS — M545 Low back pain, unspecified: Secondary | ICD-10-CM | POA: Diagnosis not present

## 2023-10-21 DIAGNOSIS — K219 Gastro-esophageal reflux disease without esophagitis: Secondary | ICD-10-CM | POA: Diagnosis not present

## 2023-10-21 DIAGNOSIS — M519 Unspecified thoracic, thoracolumbar and lumbosacral intervertebral disc disorder: Secondary | ICD-10-CM | POA: Diagnosis not present

## 2023-10-21 DIAGNOSIS — R1032 Left lower quadrant pain: Secondary | ICD-10-CM | POA: Diagnosis not present

## 2023-10-21 DIAGNOSIS — F17210 Nicotine dependence, cigarettes, uncomplicated: Secondary | ICD-10-CM | POA: Diagnosis not present

## 2023-10-22 DIAGNOSIS — I5022 Chronic systolic (congestive) heart failure: Secondary | ICD-10-CM | POA: Diagnosis not present

## 2023-10-22 DIAGNOSIS — R1310 Dysphagia, unspecified: Secondary | ICD-10-CM | POA: Diagnosis not present

## 2023-10-22 DIAGNOSIS — Z860101 Personal history of adenomatous and serrated colon polyps: Secondary | ICD-10-CM | POA: Diagnosis not present

## 2023-11-18 DIAGNOSIS — K2970 Gastritis, unspecified, without bleeding: Secondary | ICD-10-CM | POA: Diagnosis not present

## 2023-11-18 DIAGNOSIS — R1310 Dysphagia, unspecified: Secondary | ICD-10-CM | POA: Diagnosis not present

## 2023-11-18 DIAGNOSIS — K5730 Diverticulosis of large intestine without perforation or abscess without bleeding: Secondary | ICD-10-CM | POA: Diagnosis not present

## 2023-11-18 DIAGNOSIS — Z09 Encounter for follow-up examination after completed treatment for conditions other than malignant neoplasm: Secondary | ICD-10-CM | POA: Diagnosis not present

## 2023-11-18 DIAGNOSIS — K648 Other hemorrhoids: Secondary | ICD-10-CM | POA: Diagnosis not present

## 2023-11-18 DIAGNOSIS — D123 Benign neoplasm of transverse colon: Secondary | ICD-10-CM | POA: Diagnosis not present

## 2023-11-18 DIAGNOSIS — D122 Benign neoplasm of ascending colon: Secondary | ICD-10-CM | POA: Diagnosis not present

## 2023-11-18 DIAGNOSIS — Z860101 Personal history of adenomatous and serrated colon polyps: Secondary | ICD-10-CM | POA: Diagnosis not present

## 2023-11-18 DIAGNOSIS — K3189 Other diseases of stomach and duodenum: Secondary | ICD-10-CM | POA: Diagnosis not present

## 2023-11-20 DIAGNOSIS — D123 Benign neoplasm of transverse colon: Secondary | ICD-10-CM | POA: Diagnosis not present

## 2023-11-20 DIAGNOSIS — D122 Benign neoplasm of ascending colon: Secondary | ICD-10-CM | POA: Diagnosis not present

## 2023-11-20 DIAGNOSIS — K3189 Other diseases of stomach and duodenum: Secondary | ICD-10-CM | POA: Diagnosis not present

## 2024-02-08 DIAGNOSIS — I5022 Chronic systolic (congestive) heart failure: Secondary | ICD-10-CM | POA: Diagnosis not present

## 2024-02-08 DIAGNOSIS — I25111 Atherosclerotic heart disease of native coronary artery with angina pectoris with documented spasm: Secondary | ICD-10-CM | POA: Diagnosis not present

## 2024-02-08 DIAGNOSIS — E1151 Type 2 diabetes mellitus with diabetic peripheral angiopathy without gangrene: Secondary | ICD-10-CM | POA: Diagnosis not present

## 2024-02-25 ENCOUNTER — Other Ambulatory Visit: Payer: Self-pay | Admitting: Physician Assistant

## 2024-02-25 ENCOUNTER — Other Ambulatory Visit: Payer: Self-pay

## 2024-02-25 MED ORDER — SPIRONOLACTONE 25 MG PO TABS: 12.5000 mg | ORAL_TABLET | Freq: Every day | ORAL | 0 refills | Status: DC

## 2024-03-10 DIAGNOSIS — I25111 Atherosclerotic heart disease of native coronary artery with angina pectoris with documented spasm: Secondary | ICD-10-CM | POA: Diagnosis not present

## 2024-03-10 DIAGNOSIS — E1151 Type 2 diabetes mellitus with diabetic peripheral angiopathy without gangrene: Secondary | ICD-10-CM | POA: Diagnosis not present

## 2024-03-10 DIAGNOSIS — I5022 Chronic systolic (congestive) heart failure: Secondary | ICD-10-CM | POA: Diagnosis not present

## 2024-03-28 ENCOUNTER — Other Ambulatory Visit: Payer: Self-pay | Admitting: Physician Assistant

## 2024-04-10 DIAGNOSIS — I25111 Atherosclerotic heart disease of native coronary artery with angina pectoris with documented spasm: Secondary | ICD-10-CM | POA: Diagnosis not present

## 2024-04-10 DIAGNOSIS — E1151 Type 2 diabetes mellitus with diabetic peripheral angiopathy without gangrene: Secondary | ICD-10-CM | POA: Diagnosis not present

## 2024-04-10 DIAGNOSIS — I5022 Chronic systolic (congestive) heart failure: Secondary | ICD-10-CM | POA: Diagnosis not present

## 2024-04-19 DIAGNOSIS — E782 Mixed hyperlipidemia: Secondary | ICD-10-CM | POA: Diagnosis not present

## 2024-04-19 DIAGNOSIS — M519 Unspecified thoracic, thoracolumbar and lumbosacral intervertebral disc disorder: Secondary | ICD-10-CM | POA: Diagnosis not present

## 2024-04-19 DIAGNOSIS — I739 Peripheral vascular disease, unspecified: Secondary | ICD-10-CM | POA: Diagnosis not present

## 2024-04-19 DIAGNOSIS — E1151 Type 2 diabetes mellitus with diabetic peripheral angiopathy without gangrene: Secondary | ICD-10-CM | POA: Diagnosis not present

## 2024-04-19 DIAGNOSIS — D500 Iron deficiency anemia secondary to blood loss (chronic): Secondary | ICD-10-CM | POA: Diagnosis not present

## 2024-04-19 DIAGNOSIS — I10 Essential (primary) hypertension: Secondary | ICD-10-CM | POA: Diagnosis not present

## 2024-04-19 DIAGNOSIS — M8588 Other specified disorders of bone density and structure, other site: Secondary | ICD-10-CM | POA: Diagnosis not present

## 2024-04-19 DIAGNOSIS — Z0000 Encounter for general adult medical examination without abnormal findings: Secondary | ICD-10-CM | POA: Diagnosis not present

## 2024-04-19 DIAGNOSIS — G629 Polyneuropathy, unspecified: Secondary | ICD-10-CM | POA: Diagnosis not present

## 2024-04-19 DIAGNOSIS — Z23 Encounter for immunization: Secondary | ICD-10-CM | POA: Diagnosis not present

## 2024-04-19 DIAGNOSIS — K5904 Chronic idiopathic constipation: Secondary | ICD-10-CM | POA: Diagnosis not present

## 2024-04-19 DIAGNOSIS — I25111 Atherosclerotic heart disease of native coronary artery with angina pectoris with documented spasm: Secondary | ICD-10-CM | POA: Diagnosis not present

## 2024-04-19 DIAGNOSIS — I5022 Chronic systolic (congestive) heart failure: Secondary | ICD-10-CM | POA: Diagnosis not present

## 2024-05-10 DIAGNOSIS — I5022 Chronic systolic (congestive) heart failure: Secondary | ICD-10-CM | POA: Diagnosis not present

## 2024-05-10 DIAGNOSIS — E1151 Type 2 diabetes mellitus with diabetic peripheral angiopathy without gangrene: Secondary | ICD-10-CM | POA: Diagnosis not present

## 2024-05-10 DIAGNOSIS — I25111 Atherosclerotic heart disease of native coronary artery with angina pectoris with documented spasm: Secondary | ICD-10-CM | POA: Diagnosis not present

## 2024-05-31 ENCOUNTER — Ambulatory Visit: Attending: Cardiovascular Disease | Admitting: Cardiovascular Disease

## 2024-05-31 ENCOUNTER — Encounter: Payer: Self-pay | Admitting: Cardiovascular Disease

## 2024-05-31 VITALS — BP 140/82 | HR 73 | Ht 59.0 in | Wt 140.0 lb

## 2024-05-31 DIAGNOSIS — I5022 Chronic systolic (congestive) heart failure: Secondary | ICD-10-CM | POA: Diagnosis not present

## 2024-05-31 DIAGNOSIS — E785 Hyperlipidemia, unspecified: Secondary | ICD-10-CM

## 2024-05-31 DIAGNOSIS — I2510 Atherosclerotic heart disease of native coronary artery without angina pectoris: Secondary | ICD-10-CM | POA: Diagnosis not present

## 2024-05-31 DIAGNOSIS — R55 Syncope and collapse: Secondary | ICD-10-CM

## 2024-05-31 MED ORDER — ISOSORBIDE MONONITRATE ER 30 MG PO TB24: 30.0000 mg | ORAL_TABLET | Freq: Every day | ORAL | 3 refills | Status: AC

## 2024-05-31 NOTE — Progress Notes (Signed)
 Chief Complaint  Patient presents with   Follow-up    CAD   History of Present Illness: 78 yo female with history of anxiety, CAD, chronic systolic CHF, HTN, HLD, GERD and neurocardiogenic syncope who is here today for follow up. She had been followed in our office by Dr. Claudene and then was seen by Dr. Hobart. She had remote stenting of the RCA. She has had many cardiac catheterizations over the years with evidence of coronary vasospasm. Last cardiac cath in February 2024 with mild disease in all three vessels with patent RCA stent. Echo February 2024 with LVEF=45%. No valve disease. Syncope in 2024 felt to be vasovagal.   She is here today for follow up. The patient denies any dyspnea, palpitations, lower extremity edema, orthopnea, PND, dizziness, near syncope or syncope. Mild chest pressure in bed at night. No exertional chest pain.   Primary Care Physician: Ransom Other, MD   Past Medical History:  Diagnosis Date   Anemia    Anginal pain    last cp was last year   Anxiety    Arthritis    Blood transfusion    Chest pain 02/04/2016   Coronary artery disease    Coronary artery spasm, wth continued episodes of chest pain.  09/26/2013   Coronary vasospasm    Diabetes mellitus without complication (HCC)    type II   GERD (gastroesophageal reflux disease)    Headache    Hiatal hernia    Hypercholesteremia    Hypertension    NSTEMI (non-ST elevated myocardial infarction) (HCC) not sure   NSVT (nonsustained ventricular tachycardia) (HCC) 09/26/2013   Panic attack     Past Surgical History:  Procedure Laterality Date   ABDOMINAL HYSTERECTOMY     PARTIAL HYSTERECTOMY   ACDF N/A    C5-6 ACDF Dr. Alix   breast     right, tumor benign   BREAST EXCISIONAL BIOPSY Right    1961 (age 48) benign   CARDIAC CATHETERIZATION  2014   CARDIAC CATHETERIZATION  2012   CARDIAC CATHETERIZATION N/A 07/22/2016   Procedure: Left Heart Cath and Coronary Angiography;  Surgeon: Victory LELON Claudene, MD;  Location: [facility] INVASIVE CV LAB;  Service: Cardiovascular;  Laterality: N/A;   cardiac stent  2012   to RCA   CHOLECYSTECTOMY N/A 10/02/2017   Procedure: LAPAROSCOPIC CHOLECYSTECTOMY;  Surgeon: Rubin Calamity, MD;  Location: [facility] OR;  Service: General;  Laterality: N/A;   CORONARY ANGIOPLASTY     ESOPHAGEAL MANOMETRY N/A 10/29/2016   Procedure: ESOPHAGEAL MANOMETRY (EM);  Surgeon: Gladis MARLA Louder, MD;  Location: [facility] ENDOSCOPY;  Service: Endoscopy;  Laterality: N/A;   ESOPHAGOGASTRODUODENOSCOPY (EGD) WITH PROPOFOL  N/A 10/28/2016   Procedure: ESOPHAGOGASTRODUODENOSCOPY (EGD) WITH PROPOFOL ;  Surgeon: Gladis MARLA Louder, MD;  Location: [facility] ENDOSCOPY;  Service: Endoscopy;  Laterality: N/A;   EXCISION OF SKIN TAG N/A 07/29/2017   Procedure: EXCISION OF PERIANAL  SKIN TAG;  Surgeon: Rubin Calamity, MD;  Location: [facility] OR;  Service: General;  Laterality: N/A;   KNEE SURGERY  left   left ear surgery     for bad cut   LEFT HEART CATH AND CORONARY ANGIOGRAPHY N/A 10/06/2022   Procedure: LEFT HEART CATH AND CORONARY ANGIOGRAPHY;  Surgeon: Court Dorn PARAS, MD;  Location: [facility] INVASIVE CV LAB;  Service: Cardiovascular;  Laterality: N/A;   LEFT HEART CATHETERIZATION WITH CORONARY ANGIOGRAM N/A 02/22/2013   Procedure: LEFT HEART CATHETERIZATION WITH CORONARY ANGIOGRAM;  Surgeon: Victory LELON Claudene DOUGLAS, MD;  Location: [facility]  CATH LAB;  Service: Cardiovascular;  Laterality: N/A;   REVERSE SHOULDER ARTHROPLASTY Right 04/07/2022   Procedure: RIGHT SHOULDER REVERSE SHOULDER ARTHROPLASTY;  Surgeon: Genelle Standing, MD;  Location: [facility] OR;  Service: Orthopedics;  Laterality: Right;   SHOULDER SURGERY Bilateral    rotator cuff   TOE SURGERY Left    toe surgery    Current Outpatient Medications  Medication Sig Dispense Refill   ACCU-CHEK GUIDE test strip USE TO CHECK BLOOD SUGAR DAILY     Accu-Chek Softclix Lancets lancets daily.     acetaminophen  (TYLENOL ) 500 MG tablet Take 500 mg by mouth every 6 (six) hours as  needed for headache (pain).     amLODipine  (NORVASC ) 5 MG tablet Take 1 tablet (5 mg total) by mouth daily. 90 tablet 3   aspirin  EC 325 MG tablet Take 1 tablet (325 mg total) by mouth daily. 30 tablet 0   gabapentin  (NEURONTIN ) 300 MG capsule Take 300 mg by mouth 2 (two) times daily.     isosorbide  mononitrate (IMDUR ) 30 MG 24 hr tablet Take 1 tablet (30 mg total) by mouth daily. 90 tablet 3   lidocaine  4 % Place 1 patch onto the skin daily. 15 patch 0   meloxicam  (MOBIC ) 15 MG tablet TAKE 1 TABLET(15 MG) BY MOUTH DAILY 30 tablet 2   metFORMIN (GLUCOPHAGE-XR) 500 MG 24 hr tablet Take 500 mg by mouth 2 (two) times daily.     nitroGLYCERIN  (NITROSTAT ) 0.4 MG SL tablet PLACE 1 TABLET UNDER TONGUE AS NEEDED FOR CHEST PAIN EVERY 5 MINUTES UP TO 3 DOSES THEN SEEK MEDICAL ATTENTION. Please make overdue appt. 1st attempt 25 tablet 0   pantoprazole  (PROTONIX ) 40 MG tablet Take 1 tablet (40 mg total) by mouth 2 (two) times daily. 60 tablet 2   rosuvastatin  (CRESTOR ) 5 MG tablet Take 1 tablet (5 mg total) by mouth daily at 6 PM. 30 tablet 1   traZODone  (DESYREL ) 50 MG tablet Take 50-100 mg by mouth at bedtime. 1 tablet at bedtime, if not working will take second tablet     varenicline (CHANTIX) 0.5 MG tablet Take 0.5 mg by mouth 2 (two) times daily.     methocarbamol  (ROBAXIN ) 500 MG tablet Take 1 tablet (500 mg total) by mouth 4 (four) times daily. (Patient not taking: Reported on 05/31/2024) 60 tablet 3   spironolactone  (ALDACTONE ) 25 MG tablet TAKE 1/2 TABLET(12.5 MG) BY MOUTH DAILY (Patient not taking: Reported on 05/31/2024) 8 tablet 0   No current facility-administered medications for this visit.    No Known Allergies  Social History   Socioeconomic History   Marital status: Married    Spouse name: Keven   Number of children: 0   Years of education: Not on file   Highest education level: Not on file  Occupational History   Not on file  Tobacco Use   Smoking status: Every Day    Current  packs/day: 0.00    Average packs/day: 0.3 packs/day for 40.0 years (10.0 ttl pk-yrs)    Types: Cigarettes    Start date: 01/10/1976    Last attempt to quit: 01/10/2016    Years since quitting: 8.3   Smokeless tobacco: Never   Tobacco comments:    Pt reports she smokes a pack or less per week of cigarettes  Vaping Use   Vaping status: Never Used  Substance and Sexual Activity   Alcohol use: Yes    Alcohol/week: 1.0 standard drink of alcohol    Types: 1 Cans  of beer per week    Comment: occasional beer   Drug use: No   Sexual activity: Never  Other Topics Concern   Not on file  Social History Narrative   Lives with husband. Ambulates independently.   Social Drivers of Corporate investment banker Strain: Not on file  Food Insecurity: No Food Insecurity (10/05/2022)   Hunger Vital Sign    Worried About Running Out of Food in the Last Year: Never true    Ran Out of Food in the Last Year: Never true  Transportation Needs: No Transportation Needs (10/05/2022)   PRAPARE - Administrator, Civil Service (Medical): No    Lack of Transportation (Non-Medical): No  Physical Activity: Not on file  Stress: Not on file  Social Connections: Not on file  Intimate Partner Violence: Not At Risk (10/05/2022)   Humiliation, Afraid, Rape, and Kick questionnaire    Fear of Current or Ex-Partner: No    Emotionally Abused: No    Physically Abused: No    Sexually Abused: No    Family History  Problem Relation Age of Onset   Heart failure Mother    Stroke Sister    Heart attack Other     Review of Systems:  As stated in the HPI and otherwise negative.   BP (!) 140/82 (BP Location: Right Arm, Patient Position: Sitting, Cuff Size: Normal)   Pulse 73   Ht 4' 11 (1.499 m)   Wt 140 lb (63.5 kg)   SpO2 97%   BMI 28.28 kg/m   Physical Examination: General: Well developed, well nourished, NAD  HEENT: OP clear, mucus membranes moist  SKIN: warm, dry. No rashes. Neuro: No focal  deficits  Musculoskeletal: Muscle strength 5/5 all ext  Psychiatric: Mood and affect normal  Neck: No JVD, no carotid bruits, no thyromegaly, no lymphadenopathy.  Lungs:Clear bilaterally, no wheezes, rhonci, crackles Cardiovascular: Regular rate and rhythm. No murmurs, gallops or rubs. Abdomen:Soft. Bowel sounds present. Non-tender.  Extremities: No lower extremity edema. Pulses are 2 + in the bilateral DP/PT.  EKG:  EKG is ordered today. The ekg ordered today demonstrates  EKG Interpretation Date/Time:  Tuesday May 31 2024 14:52:04 EDT Ventricular Rate:  73 PR Interval:  156 QRS Duration:  90 QT Interval:  420 QTC Calculation: 462 R Axis:   43  Text Interpretation: Normal sinus rhythm Biatrial enlargement Nonspecific T wave abnormality Confirmed by Verlin Bruckner [phone]) on 05/31/2024 2:55:21 PM   Recent Labs: No results found for requested labs within last 365 days.   Lipid Panel    Component Value Date/Time   CHOL 176 11/20/2022 0839   TRIG 77 11/20/2022 0839   HDL 97 11/20/2022 0839   CHOLHDL 1.8 11/20/2022 0839   CHOLHDL 2.1 06/22/2021 0336   VLDL 16 06/22/2021 0336   LDLCALC 65 11/20/2022 0839     Wt Readings from Last 3 Encounters:  05/31/24 140 lb (63.5 kg)  09/19/23 125 lb (56.7 kg)  02/09/23 125 lb 9.6 oz (57 kg)    Assessment and Plan:   1. CAD without angina: She has remote stenting of the RCA. Mild CAD by cath in 2024. Prior episodes of coronary vasospasm noted during cardiac cath. She has been having mild chest fullness at night but no exertional chest pain. EKG does not show ischemic changes. Continue ASA, Norvasc , Imdur , Crestor . Will increase Imdur  to 30 mg daily.   2. Hyperlipidemia: LDL at goal in September 2024. Lipids followed in primary care.  Continue Crestor .   3. HTN: BP is controlled today. Continue Norvasc , Imdur   Labs/ tests ordered today include:   Orders Placed This Encounter  Procedures   EKG 12-Lead   Disposition:   F/U  with me in one year   Signed, Lonni Cash, MD, [facility] 05/31/2024 4:00 PM    [facility] [address] [place], [place], [place]  [address] Phone: [phone]; Fax: [phone]

## 2024-05-31 NOTE — Patient Instructions (Signed)
 Medication Instructions:  Your physician has recommended you make the following change in your medication: Increase isosorbide  mononitrate to 30 mg by mouth daily   *If you need a refill on your cardiac medications before your next appointment, please call your pharmacy*  Lab Work: none If you have labs (blood work) drawn today and your tests are completely normal, you will receive your results only by: MyChart Message (if you have MyChart) OR A paper copy in the mail If you have any lab test that is abnormal or we need to change your treatment, we will call you to review the results.  Testing/Procedures: none  Follow-Up: At [facility], you and your health needs are our priority.  As part of our continuing mission to provide you with exceptional heart care, our providers are all part of one team.  This team includes your primary Cardiologist (physician) and Advanced Practice Providers or APPs (Physician Assistants and Nurse Practitioners) who all work together to provide you with the care you need, when you need it.  Your next appointment:   12 month(s)  Provider:   Lonni Cash, MD    We recommend signing up for the patient portal called MyChart.  Sign up information is provided on this After Visit Summary.  MyChart is used to connect with patients for Virtual Visits (Telemedicine).  Patients are able to view lab/test results, encounter notes, upcoming appointments, etc.  Non-urgent messages can be sent to your provider as well.   To learn more about what you can do with MyChart, go to [email or web address].   Other Instructions

## 2024-06-05 ENCOUNTER — Other Ambulatory Visit: Payer: Self-pay

## 2024-06-05 ENCOUNTER — Emergency Department (HOSPITAL_COMMUNITY)

## 2024-06-05 ENCOUNTER — Encounter (HOSPITAL_COMMUNITY): Payer: Self-pay | Admitting: Emergency Medicine

## 2024-06-05 ENCOUNTER — Observation Stay (HOSPITAL_COMMUNITY)
Admission: EM | Admit: 2024-06-05 | Discharge: 2024-06-07 | Disposition: A | Discharge status: Home / Self Care | Attending: Internal Medicine | Admitting: Internal Medicine

## 2024-06-05 DIAGNOSIS — I214 Non-ST elevation (NSTEMI) myocardial infarction: Principal | ICD-10-CM | POA: Diagnosis present

## 2024-06-05 DIAGNOSIS — R079 Chest pain, unspecified: Principal | ICD-10-CM

## 2024-06-05 DIAGNOSIS — Z79899 Other long term (current) drug therapy: Secondary | ICD-10-CM | POA: Diagnosis not present

## 2024-06-05 DIAGNOSIS — I200 Unstable angina: Secondary | ICD-10-CM

## 2024-06-05 DIAGNOSIS — E1140 Type 2 diabetes mellitus with diabetic neuropathy, unspecified: Secondary | ICD-10-CM | POA: Diagnosis not present

## 2024-06-05 DIAGNOSIS — Z794 Long term (current) use of insulin: Secondary | ICD-10-CM | POA: Insufficient documentation

## 2024-06-05 DIAGNOSIS — F1090 Alcohol use, unspecified, uncomplicated: Secondary | ICD-10-CM | POA: Insufficient documentation

## 2024-06-05 DIAGNOSIS — K219 Gastro-esophageal reflux disease without esophagitis: Secondary | ICD-10-CM | POA: Diagnosis present

## 2024-06-05 DIAGNOSIS — Z955 Presence of coronary angioplasty implant and graft: Secondary | ICD-10-CM | POA: Diagnosis not present

## 2024-06-05 DIAGNOSIS — J439 Emphysema, unspecified: Secondary | ICD-10-CM | POA: Insufficient documentation

## 2024-06-05 DIAGNOSIS — R55 Syncope and collapse: Secondary | ICD-10-CM | POA: Diagnosis not present

## 2024-06-05 DIAGNOSIS — Z7901 Long term (current) use of anticoagulants: Secondary | ICD-10-CM | POA: Insufficient documentation

## 2024-06-05 DIAGNOSIS — I5022 Chronic systolic (congestive) heart failure: Secondary | ICD-10-CM | POA: Insufficient documentation

## 2024-06-05 DIAGNOSIS — I499 Cardiac arrhythmia, unspecified: Secondary | ICD-10-CM | POA: Diagnosis not present

## 2024-06-05 DIAGNOSIS — I110 Hypertensive heart disease with heart failure: Secondary | ICD-10-CM | POA: Insufficient documentation

## 2024-06-05 DIAGNOSIS — R911 Solitary pulmonary nodule: Secondary | ICD-10-CM | POA: Insufficient documentation

## 2024-06-05 DIAGNOSIS — Z7982 Long term (current) use of aspirin: Secondary | ICD-10-CM | POA: Insufficient documentation

## 2024-06-05 DIAGNOSIS — I10 Essential (primary) hypertension: Secondary | ICD-10-CM | POA: Diagnosis present

## 2024-06-05 DIAGNOSIS — I700 Atherosclerosis of aorta: Secondary | ICD-10-CM | POA: Diagnosis not present

## 2024-06-05 DIAGNOSIS — I25110 Atherosclerotic heart disease of native coronary artery with unstable angina pectoris: Secondary | ICD-10-CM | POA: Diagnosis not present

## 2024-06-05 DIAGNOSIS — R531 Weakness: Secondary | ICD-10-CM | POA: Diagnosis not present

## 2024-06-05 DIAGNOSIS — R110 Nausea: Secondary | ICD-10-CM | POA: Diagnosis not present

## 2024-06-05 DIAGNOSIS — F17210 Nicotine dependence, cigarettes, uncomplicated: Secondary | ICD-10-CM | POA: Insufficient documentation

## 2024-06-05 DIAGNOSIS — I5042 Chronic combined systolic (congestive) and diastolic (congestive) heart failure: Secondary | ICD-10-CM

## 2024-06-05 DIAGNOSIS — Z95820 Peripheral vascular angioplasty status with implants and grafts: Secondary | ICD-10-CM

## 2024-06-05 DIAGNOSIS — E119 Type 2 diabetes mellitus without complications: Secondary | ICD-10-CM

## 2024-06-05 LAB — TROPONIN I (HIGH SENSITIVITY)
Troponin I (High Sensitivity): 11 ng/L (ref <18)
Troponin I (High Sensitivity): 23 ng/L — ABNORMAL HIGH (ref <18)
Troponin I (High Sensitivity): 22 ng/L — ABNORMAL HIGH (ref <18)
Troponin I (High Sensitivity): 22 ng/L — ABNORMAL HIGH (ref <18)

## 2024-06-05 LAB — BASIC METABOLIC PANEL WITH GFR
Anion gap: 11 (ref 5–15)
BUN: 12 mg/dL (ref 8–23)
CO2: 20 mmol/L — ABNORMAL LOW (ref 22–32)
Calcium: 9 mg/dL (ref 8.9–10.3)
Chloride: 109 mmol/L (ref 98–111)
Creatinine, Ser: 0.79 mg/dL (ref 0.44–1.00)
GFR, Estimated: >60 mL/min (ref >60)
Glucose, Bld: 97 mg/dL (ref 70–99)
Potassium: 4 mmol/L (ref 3.5–5.1)
Sodium: 140 mmol/L (ref 135–145)

## 2024-06-05 LAB — CBC
HCT: 43.9 % (ref 36.0–46.0)
Hemoglobin: 14.5 g/dL (ref 12.0–15.0)
MCH: 30.5 pg (ref 26.0–34.0)
MCHC: 33 g/dL (ref 30.0–36.0)
MCV: 92.2 fL (ref 80.0–100.0)
Platelets: 237 K/uL (ref 150–400)
RBC: 4.76 MIL/uL (ref 3.87–5.11)
RDW: 14.6 % (ref 11.5–15.5)
WBC: 6.7 K/uL (ref 4.0–10.5)
nRBC: 0 % (ref 0.0–0.2)

## 2024-06-05 LAB — CBG MONITORING, ED: Glucose-Capillary: 104 mg/dL — ABNORMAL HIGH (ref 70–99)

## 2024-06-05 MED ORDER — SODIUM CHLORIDE 0.9 % IV BOLUS
500.0000 mL | Freq: Once | INTRAVENOUS | Status: AC
  Administered 2024-06-05: 500 mL via INTRAVENOUS

## 2024-06-05 MED ORDER — ALUM & MAG HYDROXIDE-SIMETH 200-200-20 MG/5ML PO SUSP
30.0000 mL | Freq: Once | ORAL | Status: AC
  Administered 2024-06-05: 30 mL via ORAL
  Filled 2024-06-05: qty 30

## 2024-06-05 NOTE — Consult Note (Incomplete)
 Cardiology Consultation   Patient ID: [name] MRN: [id number]; DOB: December 03, 1945  Admit date: 06/05/2024 Date of Consult: 06/06/2024  PCP:  Ransom Other, MD   [facility] Providers Cardiologist:  Lonni Cash, MD        Patient Profile: [name] is a 78 y.o. 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 Simon.  History of Present Illness: [name] presents with presyncope and chest pain today. She reports an episode of syncope this morning while at church (before noon) followed chest pain episodes. Describes the chest pain as sharp, cramping pain on the left side of her chest; the pain previously lasted for about 5 minutes. Reports having a few episodes, not associated with any activity. Pain improved some with Maalox though is still present. Reports prior similar episodes of pain (last on Saturday which improved with nitroglycerin ). Last syncopal episode was 6 months ago (felt to be vasovagal). Reports orthopnea intermittently at home which is chronic. Denies dyspnea on exertion, BLE edema, palpitations, fevers, major bleeding, recent hematuria or hematochezia.   Of note, last LHC 09/2022 - pLAD 30%, D1 40%, pRCA to mRCA 25%. She received 4 baby aspirin  with EMS today.    Past Medical History:  Diagnosis Date   Anemia    Anginal pain    last cp was last year   Anxiety    Arthritis    Blood transfusion    Chest pain 02/04/2016   Coronary artery disease    Coronary artery spasm, wth continued episodes of chest pain.  09/26/2013   Coronary vasospasm    Diabetes mellitus without complication (HCC)    type II   GERD (gastroesophageal reflux disease)    Headache    Hiatal hernia    Hypercholesteremia    Hypertension    NSTEMI (non-ST elevated myocardial infarction) (HCC) not sure    NSVT (nonsustained ventricular tachycardia) (HCC) 09/26/2013   Panic attack     Past Surgical History:  Procedure Laterality Date   ABDOMINAL HYSTERECTOMY     PARTIAL HYSTERECTOMY   ACDF N/A    C5-6 ACDF Dr. Alix   breast     right, tumor benign   BREAST EXCISIONAL BIOPSY Right    1961 (age 84) benign   CARDIAC CATHETERIZATION  2014   CARDIAC CATHETERIZATION  2012   CARDIAC CATHETERIZATION N/A 07/22/2016   Procedure: Left Heart Cath and Coronary Angiography;  Surgeon: Victory LELON Sharps, MD;  Location: [facility] INVASIVE CV LAB;  Service: Cardiovascular;  Laterality: N/A;   cardiac stent  2012   to RCA   CHOLECYSTECTOMY N/A 10/02/2017   Procedure: LAPAROSCOPIC CHOLECYSTECTOMY;  Surgeon: Rubin Calamity, MD;  Location: [facility] OR;  Service: General;  Laterality: N/A;   CORONARY ANGIOPLASTY     ESOPHAGEAL MANOMETRY N/A 10/29/2016   Procedure: ESOPHAGEAL MANOMETRY (EM);  Surgeon: Gladis MARLA Louder, MD;  Location: [facility] ENDOSCOPY;  Service: Endoscopy;  Laterality: N/A;   ESOPHAGOGASTRODUODENOSCOPY (EGD) WITH PROPOFOL  N/A 10/28/2016   Procedure: ESOPHAGOGASTRODUODENOSCOPY (EGD) WITH PROPOFOL ;  Surgeon: Gladis MARLA Louder, MD;  Location: [facility] ENDOSCOPY;  Service: Endoscopy;  Laterality: N/A;   EXCISION OF SKIN TAG N/A 07/29/2017   Procedure: EXCISION OF PERIANAL  SKIN TAG;  Surgeon: Rubin Calamity, MD;  Location: [facility] OR;  Service: General;  Laterality: N/A;   KNEE SURGERY  left   left ear  surgery     for bad cut   LEFT HEART CATH AND CORONARY ANGIOGRAPHY N/A 10/06/2022   Procedure: LEFT HEART CATH AND CORONARY ANGIOGRAPHY;  Surgeon: Court Dorn PARAS, MD;  Location: [facility] INVASIVE CV LAB;  Service: Cardiovascular;  Laterality: N/A;   LEFT HEART CATHETERIZATION WITH CORONARY ANGIOGRAM N/A 02/22/2013   Procedure: LEFT HEART CATHETERIZATION WITH CORONARY ANGIOGRAM;  Surgeon: Victory LELON Claudene DOUGLAS, MD;  Location: [facility] CATH LAB;  Service: Cardiovascular;  Laterality: N/A;   REVERSE SHOULDER ARTHROPLASTY Right 04/07/2022    Procedure: RIGHT SHOULDER REVERSE SHOULDER ARTHROPLASTY;  Surgeon: Genelle Standing, MD;  Location: [facility] OR;  Service: Orthopedics;  Laterality: Right;   SHOULDER SURGERY Bilateral    rotator cuff   TOE SURGERY Left    toe surgery     Home Medications:  Prior to Admission medications   Medication Sig Start Date End Date Taking? Authorizing Provider  amLODipine  (NORVASC ) 5 MG tablet Take 1 tablet (5 mg total) by mouth daily. 11/04/22  Yes Lucien Orren SAILOR, PA-C  aspirin  EC 81 MG tablet Take 81 mg by mouth daily. Swallow whole.   Yes [provider]  gabapentin  (NEURONTIN ) 300 MG capsule Take 300 mg by mouth 2 (two) times daily. 05/17/24  Yes [provider]  isosorbide  mononitrate (IMDUR ) 30 MG 24 hr tablet Take 1 tablet (30 mg total) by mouth daily. 05/31/24  Yes Verlin Lonni BIRCH, MD  meloxicam  (MOBIC ) 15 MG tablet TAKE 1 TABLET(15 MG) BY MOUTH DAILY 07/29/23  Yes Genelle Standing, MD  metFORMIN (GLUCOPHAGE-XR) 500 MG 24 hr tablet Take 500 mg by mouth 2 (two) times daily. 06/28/20  Yes [provider]  nitroGLYCERIN  (NITROSTAT ) 0.4 MG SL tablet PLACE 1 TABLET UNDER TONGUE AS NEEDED FOR CHEST PAIN EVERY 5 MINUTES UP TO 3 DOSES THEN SEEK MEDICAL ATTENTION. Please make overdue appt. 1st attempt 02/18/19  Yes Claudene Victory LELON, MD  pantoprazole  (PROTONIX ) 40 MG tablet Take 1 tablet (40 mg total) by mouth 2 (two) times daily. 04/20/14  Yes Marylu Leita SAUNDERS, NP  rosuvastatin  (CRESTOR ) 5 MG tablet Take 1 tablet (5 mg total) by mouth daily at 6 PM. 01/24/17  Yes Sebastian Toribio GAILS, MD  traZODone  (DESYREL ) 100 MG tablet Take 100 mg by mouth at bedtime. 04/19/24  Yes [provider]  varenicline (CHANTIX) 0.5 MG tablet Take 0.5 mg by mouth 2 (two) times daily. 04/19/24  Yes [provider]  Vitamin D , Ergocalciferol , (DRISDOL) 1.25 MG (50000 UNIT) CAPS capsule Take 50,000 Units by mouth once a week. Sundays 04/20/24  Yes [provider]  ACCU-CHEK GUIDE test  strip USE TO CHECK BLOOD SUGAR DAILY 11/05/20   [provider]  Accu-Chek Softclix Lancets lancets daily. 11/05/20   [provider]  spironolactone  (ALDACTONE ) 25 MG tablet TAKE 1/2 TABLET(12.5 MG) BY MOUTH DAILY Patient not taking: Reported on 05/31/2024 03/31/24   Lucien Orren SAILOR, PA-C    Scheduled Meds:  Continuous Infusions:  heparin  800 Units/hr (06/06/24 0218)   PRN Meds: nitroGLYCERIN   Allergies:   No Known Allergies  Social History:   Social History   Socioeconomic History   Marital status: Married    Spouse name: Keven   Number of children: 0   Years of education: Not on file   Highest education level: Not on file  Occupational History   Not on file  Tobacco Use   Smoking status: Every Day    Current packs/day: 0.00    Average packs/day: 0.3 packs/day for 40.0  years (10.0 ttl pk-yrs)    Types: Cigarettes    Start date: 01/10/1976    Last attempt to quit: 01/10/2016    Years since quitting: 8.4   Smokeless tobacco: Never   Tobacco comments:    Pt reports she smokes a pack or less per week of cigarettes  Vaping Use   Vaping status: Never Used  Substance and Sexual Activity   Alcohol use: Yes    Alcohol/week: 1.0 standard drink of alcohol    Types: 1 Cans of beer per week    Comment: occasional beer   Drug use: No   Sexual activity: Never  Other Topics Concern   Not on file  Social History Narrative   Lives with husband. Ambulates independently.   Social Drivers of Corporate Investment Banker Strain: Not on file  Food Insecurity: No Food Insecurity (10/05/2022)   Hunger Vital Sign    Worried About Running Out of Food in the Last Year: Never true    Ran Out of Food in the Last Year: Never true  Transportation Needs: No Transportation Needs (10/05/2022)   PRAPARE - Administrator, Civil Service (Medical): No    Lack of Transportation (Non-Medical): No  Physical Activity: Not on file  Stress: Not on file  Social Connections: Not  on file  Intimate Partner Violence: Not At Risk (10/05/2022)   Humiliation, Afraid, Rape, and Kick questionnaire    Fear of Current or Ex-Partner: No    Emotionally Abused: No    Physically Abused: No    Sexually Abused: No    Family History:   As below: Family History  Problem Relation Age of Onset   Heart failure Mother    Stroke Sister    Heart attack Other      ROS:  Please see the history of present illness.  All other ROS reviewed and negative.     Physical Exam/Data: Vitals:   06/05/24 2330 06/06/24 0038 06/06/24 0100 06/06/24 0230  BP: 137/64 (!) 121/55 123/70 (!) 119/52  Pulse: (!) 58 63 69 (!) 56  Resp: 18 19 (!) 21 17  Temp:  97.9 F (36.6 C)    TempSrc:  Oral    SpO2: 95% 94% 92% 94%  Weight:      Height:       No intake or output data in the 24 hours ending 06/06/24 0336    06/05/2024    1:28 PM 05/31/2024    2:42 PM 09/19/2023   11:16 AM  Last 3 Weights  Weight (lbs) 140 lb 140 lb 125 lb  Weight (kg) 63.504 kg 63.504 kg 56.7 kg     Body mass index is 28.28 kg/m.  General:  Well nourished, well developed, in no acute distress HEENT: normal Neck: no JVD Vascular: No carotid bruits; Distal pulses 2+ bilaterally Cardiac:  normal S1, S2; RRR; no murmur  Lungs:  clear to auscultation bilaterally, no wheezing, rhonchi or rales  Abd: soft, nontender, no hepatomegaly  Ext: no edema Musculoskeletal:  No deformities, BUE and BLE strength normal and equal Skin: warm and dry  Neuro:  CNs 2-12 intact, no focal abnormalities noted Psych:  Normal affect   EKG:  The EKG was personally reviewed and demonstrates:  NSR, t-wave inversion V5-V6 (stable from prior) Telemetry:  Telemetry was personally reviewed and demonstrates:  NSR 50-60s with rare PAC and PVCs  Relevant CV Studies: Holter monitor 10/2022  Patch wear time was 13 days and 1 hour  Predominant rhythm is NSE with average HR 76bpm   There was 1 run of NSVT lasting 4 beats   3 runs of nonsustained  SVT with longest lasting 4 beats   Rare SVE (<1%), rare VE (<1%)   Patient triggered events correlate with NSR and one PVC  LHC 09/2022 Prox LAD lesion is 30% stenosed.   Ost 1st Diag lesion is 40% stenosed.   Prox RCA to Mid RCA lesion is 25% stenosed.   TTE 09/2022  1. Left ventricular ejection fraction, by estimation, is 45%. The left  ventricle has mildly decreased function. The left ventricle demonstrates  regional wall motion abnormalities (see scoring diagram/findings for  description). Left ventricular diastolic   parameters are consistent with Grade I diastolic dysfunction (impaired  relaxation). The average left ventricular global longitudinal strain is  -13.0 %. The global longitudinal strain is abnormal.   2. Abnormal inferior and inferolateral regional strain.   3. Right ventricular systolic function is normal. The right ventricular  size is normal. There is normal pulmonary artery systolic pressure. The  estimated right ventricular systolic pressure is 17.0 mmHg.   4. The mitral valve is grossly normal. Trivial mitral valve  regurgitation.   5. The aortic valve is tricuspid. Aortic valve regurgitation is not  visualized. No aortic stenosis is present.   6. The inferior vena cava is normal in size with greater than 50%  respiratory variability, suggesting right atrial pressure of 3 mmHg.   Laboratory Data: High Sensitivity Troponin:   Recent Labs  Lab 06/05/24 1349 06/05/24 1530 06/05/24 1755 06/05/24 2126  TROPONINIHS 11 23* 22* 22*     Chemistry Recent Labs  Lab 06/05/24 1442  NA 140  K 4.0  CL 109  CO2 20*  GLUCOSE 97  BUN 12  CREATININE 0.79  CALCIUM  9.0  GFRNONAA >60  ANIONGAP 11    No results for input(s): PROT, ALBUMIN, AST, ALT, ALKPHOS, BILITOT in the last 168 hours. Lipids No results for input(s): CHOL, TRIG, HDL, LABVLDL, LDLCALC, CHOLHDL in the last 168 hours.  Hematology Recent Labs  Lab 06/05/24 1349  WBC 6.7   RBC 4.76  HGB 14.5  HCT 43.9  MCV 92.2  MCH 30.5  MCHC 33.0  RDW 14.6  PLT 237   Thyroid  No results for input(s): TSH, FREET4 in the last 168 hours.  BNPNo results for input(s): BNP, PROBNP in the last 168 hours.  DDimer No results for input(s): DDIMER in the last 168 hours.  Radiology/Studies:  CT Angio Chest PE W and/or Wo Contrast Result Date: 06/06/2024 EXAM: CTA of the Chest with contrast for PE 06/05/2024 11:59:00 PM TECHNIQUE: CTA of the chest was performed after the administration of 75 mL of iohexol  (OMNIPAQUE ) 350 MG/ML injection. Multiplanar reformatted images are provided for review. MIP images are provided for review. Automated exposure control, iterative reconstruction, and/or weight based adjustment of the mA/kV was utilized to reduce the radiation dose to as low as reasonably achievable. COMPARISON: CT cardiac 07/16/2022. CLINICAL HISTORY: FINDINGS: PULMONARY ARTERIES: Pulmonary arteries are adequately opacified for evaluation. No pulmonary embolism. Main pulmonary artery is normal in caliber. MEDIASTINUM: The heart demonstrates mild calcified atherosclerotic disease of the coronary arteries. The pericardium demonstrates no acute abnormality. There is mild calcified atherosclerotic disease of the aorta. There is mild diffuse esophageal wall thickening. LYMPH NODES: No mediastinal, hilar or axillary lymphadenopathy. LUNGS AND PLEURA: Mild emphysema present. A ground glass nodule in the right upper lobe measuring 5 mm (image 6/51) is unchanged  from 07/16/2022. There is minimal bibasilar atelectasis. No pleural effusion or pneumothorax. UPPER ABDOMEN: Limited images of the upper abdomen are unremarkable. SOFT TISSUES AND BONES: No acute bone or soft tissue abnormality. IMPRESSION: 1. No evidence of pulmonary embolism. 2. Mild emphysema. 3. Stable 5 mm right upper lobe ground glass nodule. No routine follow-up imaging is recommended as per Fleischner Society Guidelines.  Electronically signed by: Greig Pique MD 06/06/2024 12:11 AM EDT RP Workstation: [id number]   CT Head Wo Contrast Result Date: 06/05/2024 EXAM: CT HEAD WITHOUT CONTRAST 06/05/2024 02:25:53 PM TECHNIQUE: CT of the head was performed without the administration of intravenous contrast. Automated exposure control, iterative reconstruction, and/or weight based adjustment of the mA/kV was utilized to reduce the radiation dose to as low as reasonably achievable. COMPARISON: CT head 10/05/2002 CLINICAL HISTORY: Syncope/presyncope, cerebrovascular cause suspected. FINDINGS: BRAIN AND VENTRICLES: No acute hemorrhage. No evidence of acute infarct. No hydrocephalus. No extra-axial collection. No mass effect or midline shift. ORBITS: No acute abnormality. SINUSES: No acute abnormality. SOFT TISSUES AND SKULL: No acute soft tissue abnormality. No skull fracture. IMPRESSION: 1. No acute intracranial abnormality. Electronically signed by: Gilmore Molt MD 06/05/2024 02:33 PM EDT RP Workstation: [id number]   DG Chest Portable 1 View Result Date: 06/05/2024 CLINICAL DATA:  Chest pain. EXAM: PORTABLE CHEST 1 VIEW COMPARISON:  10/05/2022. FINDINGS: The heart is enlarged and the mediastinal contour is within normal limits. There is atherosclerotic calcification of the aorta. Lung volumes are low. No consolidation, effusion, or pneumothorax is seen. Right shoulder arthroplasty changes and cervical spinal fusion hardware are unchanged. No acute osseous abnormality. IMPRESSION: No active disease. Electronically Signed   By: Leita Birmingham M.D.   On: 06/05/2024 14:15     Assessment and Plan: Unstable angina Presents with increasing episodes of chest pain with EKG stable from prior and low but slightly dynamic high-sensitivity troponin (11-20 3-22) concerning for unstable angina versus NSTEMI.  This occurred in the context of a syncopal event which was even more concerning.  She was having mild active chest pain at the time  of my evaluation.  Per radiology, CTA chest PE/dissection protocol shows no evidence of dissection, so recommend starting heparin  per ACS protocol and loading with aspirin .  Will plan for likely CTA coronary tomorrow but would keep n.p.o. in case of need for cath.  Would also give nitroglycerin  until she is chest pain-free. - Admit to medicine - CT Chest PE/dissection protocol - ASA 325mg  received with EMS, start ASA 81mg  daily 10/17 - Heparin  per ACS protocol given CT negative for dissection - PO or IV nitroglycerin  until chest pain free - Likely CTA coronary tomorrow - Keep NPOpMN in case of cath - Continue home isosorbide  mononitrate 30mg  qd - Increase rosuvastatin  to 20mg  for high-intensity dose - TSH, lipids, A1c  2. Syncope She has history of neurocardiogenic syncope with prior episodes felt vasovagal in nature. Had brief syncopal episode. Episode today concerning for possible arrhythmia in the context of chest pain and mild hsTi changes (though none seen on telemetry). Otherwise no symptoms c/w seizure or stroke; CTH negative. Would obtain TTE to evaluate EF and for valvular disease.  - Observe on telemetry - Obtain TTE in AM - Ischemic eval as above  3. Chronic systolic and diastolic heart failure Euvolemic on exam with no evidence of decompensation, NYHA class II.  - Repeat TTE as above - Hold spironolactone  as patient reports not taking at home - Escalation of GDMT pending TTE  Risk Assessment/Risk Scores:  TIMI Risk Score for Unstable Angina or Non-ST Elevation MI:   The patient's TIMI risk score is 5, which indicates a 26% risk of all cause mortality, new or recurrent myocardial infarction or need for urgent revascularization in the next 14 days.  [place]  Heart Association (NYHA) Functional Class NYHA Class II       For questions or updates, please contact [facility] Please consult [email or web address] for contact info under      Signed, Chiquita ONEIDA Sauers, MD  06/06/2024 3:36 AM

## 2024-06-05 NOTE — ED Notes (Signed)
 Patient transported to CT

## 2024-06-05 NOTE — ED Provider Notes (Signed)
 Received patient in turnover from Dr. Simon.  Please see their note for further details of Hx, PE.  Briefly patient is a 78 y.o. female with a Near Syncope .  Patient with elevated trop, plan for cardiology consult.  Cards saw patient, recommend CTA chest if negative for PE, start heparin , admit to medicine.   CT angiogram of the chest is negative.  Will start on heparin .  Will discuss with medicine.    Emil Share, DO 06/06/24 [phone]

## 2024-06-05 NOTE — ED Provider Notes (Signed)
  Physical Exam  BP 136/67   Pulse (!) 58   Temp 98.2 F (36.8 C)   Resp 20   Ht 4' 11 (1.499 m)   Wt 63.5 kg   SpO2 92%   BMI 28.28 kg/m   Physical Exam Vitals and nursing note reviewed.  Constitutional:      General: She is not in acute distress.    Appearance: She is well-developed.  HENT:     Head: Normocephalic and atraumatic.  Eyes:     Conjunctiva/sclera: Conjunctivae normal.  Cardiovascular:     Rate and Rhythm: Normal rate and regular rhythm.     Heart sounds: No murmur heard. Pulmonary:     Effort: Pulmonary effort is normal. No respiratory distress.     Breath sounds: Normal breath sounds.  Abdominal:     Palpations: Abdomen is soft.     Tenderness: There is no abdominal tenderness.  Musculoskeletal:        General: No swelling.     Cervical back: Neck supple.  Skin:    General: Skin is warm and dry.     Capillary Refill: Capillary refill takes less than 2 seconds.  Neurological:     Mental Status: She is alert.  Psychiatric:        Mood and Affect: Mood normal.     Procedures  Procedures  ED Course / MDM   Clinical Course as of 06/05/24 2334  [place] Jun 05, 2024  2333 Per cards: Need admit for observation. Will need CTA chest for PE.  [TL]    Clinical Course User Index [TL] Simon Lavonia SAILOR, MD   Medical Decision Making Amount and/or Complexity of Data Reviewed Labs: ordered. Radiology: ordered.  Risk OTC drugs.    Patient endorses episode of syncope while at church singing.  This has happened in the past.  So far, negative workup.  Pending second troponin.  If second troponin is okay will likely be discharged home.  Still Dors and some slight chest discomfort.  Obtain third troponin given delta change in the second troponin.  Third troponin unremarkable.  Cardiology consult.  Recommended repeat troponin for fourth.  They will see the patient.   EKG shows no acute changes.   Patient did respond well to some Maalox.  I think this could  very well could be more GI related.   Signed out pending cardiology consult     Simon Lavonia SAILOR, MD 06/05/24 2325

## 2024-06-05 NOTE — ED Notes (Signed)
 CCMD contacted to monitor pt

## 2024-06-05 NOTE — ED Provider Notes (Signed)
 [place] EMERGENCY DEPARTMENT AT [facility] Provider Note   CSN: [id number] Arrival date & time: 06/05/24  1320     Patient presents with: Near Syncope   [name] is a 78 y.o. female.  {Add pertinent medical, surgical, social history, OB history to HPI:7069} 78 year old female with prior medical history as detailed below presents for evaluation.  Patient was at church.  She was singing in her choir.  She then developed acute nausea and proceeded to have a brief syncopal event.  This has happened 1 other time while she was singing in choir.    She reports that she feels much better.  She complains of vague chest discomfort now.  She denies nausea.  The history is provided by the patient and medical records.       Prior to Admission medications   Medication Sig Start Date End Date Taking? Authorizing Provider  ACCU-CHEK GUIDE test strip USE TO CHECK BLOOD SUGAR DAILY 11/05/20   [provider]  Accu-Chek Softclix Lancets lancets daily. 11/05/20   [provider]  acetaminophen  (TYLENOL ) 500 MG tablet Take 500 mg by mouth every 6 (six) hours as needed for headache (pain).    [provider]  amLODipine  (NORVASC ) 5 MG tablet Take 1 tablet (5 mg total) by mouth daily. 11/04/22   Lucien Orren SAILOR, PA-C  aspirin  EC 325 MG tablet Take 1 tablet (325 mg total) by mouth daily. 04/07/22   Genelle Standing, MD  gabapentin  (NEURONTIN ) 300 MG capsule Take 300 mg by mouth 2 (two) times daily. 05/17/24   [provider]  isosorbide  mononitrate (IMDUR ) 30 MG 24 hr tablet Take 1 tablet (30 mg total) by mouth daily. 05/31/24   Verlin Lonni BIRCH, MD  lidocaine  4 % Place 1 patch onto the skin daily. 09/19/23   Neysa Caron PARAS, DO  meloxicam  (MOBIC ) 15 MG tablet TAKE 1 TABLET(15 MG) BY MOUTH DAILY 07/29/23   Genelle Standing, MD  metFORMIN (GLUCOPHAGE-XR) 500 MG 24 hr tablet Take 500 mg by mouth 2 (two) times daily. 06/28/20   [provider]   methocarbamol  (ROBAXIN ) 500 MG tablet Take 1 tablet (500 mg total) by mouth 4 (four) times daily. Patient not taking: Reported on 05/31/2024 02/05/23   Genelle Standing, MD  nitroGLYCERIN  (NITROSTAT ) 0.4 MG SL tablet PLACE 1 TABLET UNDER TONGUE AS NEEDED FOR CHEST PAIN EVERY 5 MINUTES UP TO 3 DOSES THEN SEEK MEDICAL ATTENTION. Please make overdue appt. 1st attempt 02/18/19   Claudene Victory ORN, MD  pantoprazole  (PROTONIX ) 40 MG tablet Take 1 tablet (40 mg total) by mouth 2 (two) times daily. 04/20/14   Marylu Leita SAUNDERS, NP  rosuvastatin  (CRESTOR ) 5 MG tablet Take 1 tablet (5 mg total) by mouth daily at 6 PM. 01/24/17   Sebastian Toribio GAILS, MD  spironolactone  (ALDACTONE ) 25 MG tablet TAKE 1/2 TABLET(12.5 MG) BY MOUTH DAILY Patient not taking: Reported on 05/31/2024 03/31/24   Lucien Orren SAILOR, PA-C  traZODone  (DESYREL ) 50 MG tablet Take 50-100 mg by mouth at bedtime. 1 tablet at bedtime, if not working will take second tablet    [provider]  varenicline (CHANTIX) 0.5 MG tablet Take 0.5 mg by mouth 2 (two) times daily. 04/19/24   [provider]    Allergies: Patient has no known allergies.    Review of Systems  All other systems reviewed and are negative.   Updated Vital Signs BP 125/76 (BP Location: Right Arm)   Pulse 69   Temp 98.4  F (36.9 C) (Oral)   Resp 19   Ht 4' 11 (1.499 m)   Wt 63.5 kg   SpO2 95%   BMI 28.28 kg/m   Physical Exam Vitals and nursing note reviewed.  Constitutional:      General: She is not in acute distress.    Appearance: She is well-developed.  HENT:     Head: Normocephalic and atraumatic.  Eyes:     Conjunctiva/sclera: Conjunctivae normal.  Cardiovascular:     Rate and Rhythm: Normal rate and regular rhythm.     Heart sounds: No murmur heard. Pulmonary:     Effort: Pulmonary effort is normal. No respiratory distress.     Breath sounds: Normal breath sounds.  Abdominal:     Palpations: Abdomen is soft.     Tenderness: There is no  abdominal tenderness.  Musculoskeletal:        General: No swelling.     Cervical back: Neck supple.  Skin:    General: Skin is warm and dry.     Capillary Refill: Capillary refill takes less than 2 seconds.  Neurological:     Mental Status: She is alert.  Psychiatric:        Mood and Affect: Mood normal.     (all labs ordered are listed, but only abnormal results are displayed) Labs Reviewed  CBG MONITORING, ED - Abnormal; Notable for the following components:      Result Value   Glucose-Capillary 104 (*)    All other components within normal limits  BASIC METABOLIC PANEL WITH GFR  CBC  TROPONIN I (HIGH SENSITIVITY)    EKG: None  Radiology: No results found.  {Document cardiac monitor, telemetry assessment procedure when appropriate:32947} Procedures   Medications Ordered in the ED  sodium chloride  0.9 % bolus 500 mL (has no administration in time range)      {Click here for ABCD2, HEART and other calculators REFRESH Note before signing:1}                              Medical Decision Making Amount and/or Complexity of Data Reviewed Labs: ordered. Radiology: ordered.   ***  {Document critical care time when appropriate  Document review of labs and clinical decision tools ie CHADS2VASC2, etc  Document your independent review of radiology images and any outside records  Document your discussion with family members, caretakers and with consultants  Document social determinants of health affecting pt's care  Document your decision making why or why not admission, treatments were needed:32947:::1}   Final diagnoses:  None    ED Discharge Orders     None

## 2024-06-05 NOTE — ED Triage Notes (Signed)
 PT BIB EMS from church. Was singing in Choir when had a syncopal episode and was assisted to ground by other. No chest pain at time of syncopal episode however while with EMS en route started to have chest pressure under left breast also complains of dizziness. Pt complains of similar chest pain yesterday and took 2 nitroglycerin  that relieved pain   EMS vitals  112/90 Cbg 182 Hr 58-84 sinus arrhythmia    Chest pain 7/10 20g left hand 4mg  zofran  324 asa

## 2024-06-05 NOTE — ED Notes (Signed)
EDP at bedside at this time.  

## 2024-06-06 ENCOUNTER — Encounter (HOSPITAL_COMMUNITY): Payer: Self-pay | Admitting: Internal Medicine

## 2024-06-06 ENCOUNTER — Observation Stay (HOSPITAL_COMMUNITY)

## 2024-06-06 DIAGNOSIS — E785 Hyperlipidemia, unspecified: Secondary | ICD-10-CM

## 2024-06-06 DIAGNOSIS — I10 Essential (primary) hypertension: Secondary | ICD-10-CM

## 2024-06-06 DIAGNOSIS — R55 Syncope and collapse: Secondary | ICD-10-CM | POA: Diagnosis not present

## 2024-06-06 DIAGNOSIS — I5022 Chronic systolic (congestive) heart failure: Secondary | ICD-10-CM

## 2024-06-06 DIAGNOSIS — J439 Emphysema, unspecified: Secondary | ICD-10-CM | POA: Insufficient documentation

## 2024-06-06 DIAGNOSIS — I25118 Atherosclerotic heart disease of native coronary artery with other forms of angina pectoris: Secondary | ICD-10-CM | POA: Diagnosis not present

## 2024-06-06 DIAGNOSIS — R079 Chest pain, unspecified: Secondary | ICD-10-CM | POA: Diagnosis not present

## 2024-06-06 DIAGNOSIS — I214 Non-ST elevation (NSTEMI) myocardial infarction: Secondary | ICD-10-CM | POA: Diagnosis not present

## 2024-06-06 DIAGNOSIS — I25110 Atherosclerotic heart disease of native coronary artery with unstable angina pectoris: Secondary | ICD-10-CM

## 2024-06-06 LAB — CBC WITH DIFFERENTIAL/PLATELET
Abs Immature Granulocytes: 0.01 K/uL (ref 0.00–0.07)
Basophils Absolute: 0 K/uL (ref 0.0–0.1)
Basophils Relative: 1 %
Eosinophils Absolute: 0.1 K/uL (ref 0.0–0.5)
Eosinophils Relative: 2 %
HCT: 41.3 % (ref 36.0–46.0)
Hemoglobin: 13.6 g/dL (ref 12.0–15.0)
Immature Granulocytes: 0 %
Lymphocytes Relative: 42 %
Lymphs Abs: 2.5 K/uL (ref 0.7–4.0)
MCH: 30.6 pg (ref 26.0–34.0)
MCHC: 32.9 g/dL (ref 30.0–36.0)
MCV: 92.8 fL (ref 80.0–100.0)
Monocytes Absolute: 0.4 K/uL (ref 0.1–1.0)
Monocytes Relative: 7 %
Neutro Abs: 2.8 K/uL (ref 1.7–7.7)
Neutrophils Relative %: 48 %
Platelets: 222 K/uL (ref 150–400)
RBC: 4.45 MIL/uL (ref 3.87–5.11)
RDW: 14.6 % (ref 11.5–15.5)
WBC: 5.8 K/uL (ref 4.0–10.5)
nRBC: 0 % (ref 0.0–0.2)

## 2024-06-06 LAB — HEPATIC FUNCTION PANEL
ALT: 16 U/L (ref 0–44)
AST: 23 U/L (ref 15–41)
Albumin: 3.1 g/dL — ABNORMAL LOW (ref 3.5–5.0)
Alkaline Phosphatase: 83 U/L (ref 38–126)
Bilirubin, Direct: 0.2 mg/dL (ref 0.0–0.2)
Indirect Bilirubin: 0.1 mg/dL — ABNORMAL LOW (ref 0.3–0.9)
Total Bilirubin: 0.3 mg/dL (ref 0.0–1.2)
Total Protein: 6 g/dL — ABNORMAL LOW (ref 6.5–8.1)

## 2024-06-06 LAB — BASIC METABOLIC PANEL WITH GFR
Anion gap: 10 (ref 5–15)
BUN: 10 mg/dL (ref 8–23)
CO2: 23 mmol/L (ref 22–32)
Calcium: 8.7 mg/dL — ABNORMAL LOW (ref 8.9–10.3)
Chloride: 108 mmol/L (ref 98–111)
Creatinine, Ser: 0.79 mg/dL (ref 0.44–1.00)
GFR, Estimated: >60 mL/min (ref >60)
Glucose, Bld: 106 mg/dL — ABNORMAL HIGH (ref 70–99)
Potassium: 4 mmol/L (ref 3.5–5.1)
Sodium: 141 mmol/L (ref 135–145)

## 2024-06-06 LAB — LIPID PANEL
Cholesterol: 118 mg/dL (ref 0–200)
HDL: 71 mg/dL (ref >40)
LDL Cholesterol: 38 mg/dL (ref 0–99)
Total CHOL/HDL Ratio: 1.7 ratio
Triglycerides: 43 mg/dL (ref <150)
VLDL: 9 mg/dL (ref 0–40)

## 2024-06-06 LAB — ECHOCARDIOGRAM COMPLETE
AR max vel: 2.81 cm2
AV Area VTI: 2.48 cm2
AV Area mean vel: 2.68 cm2
AV Mean grad: 3 mmHg
AV Peak grad: 7 mmHg
Ao pk vel: 1.32 m/s
Area-P 1/2: 2.92 cm2
Calc EF: 48.9 %
Est EF: 45
Height: 59 in
S' Lateral: 4.2 cm
Single Plane A2C EF: 47.5 %
Single Plane A4C EF: 47.5 %
Weight: 2240 [oz_av]

## 2024-06-06 LAB — CBG MONITORING, ED
Glucose-Capillary: 109 mg/dL — ABNORMAL HIGH (ref 70–99)
Glucose-Capillary: 133 mg/dL — ABNORMAL HIGH (ref 70–99)
Glucose-Capillary: 101 mg/dL — ABNORMAL HIGH (ref 70–99)

## 2024-06-06 LAB — GLUCOSE, CAPILLARY: Glucose-Capillary: 110 mg/dL — ABNORMAL HIGH (ref 70–99)

## 2024-06-06 LAB — HEPARIN LEVEL (UNFRACTIONATED): Heparin Unfractionated: 0.36 [IU]/mL (ref 0.30–0.70)

## 2024-06-06 LAB — MAGNESIUM: Magnesium: 1.8 mg/dL (ref 1.7–2.4)

## 2024-06-06 LAB — HEMOGLOBIN A1C
Hgb A1c MFr Bld: 6.6 % — ABNORMAL HIGH (ref 4.8–5.6)
Mean Plasma Glucose: 142.72 mg/dL

## 2024-06-06 LAB — TSH: TSH: 3.046 u[IU]/mL (ref 0.350–4.500)

## 2024-06-06 MED ORDER — ISOSORBIDE MONONITRATE ER 30 MG PO TB24
30.0000 mg | ORAL_TABLET | Freq: Every day | ORAL | Status: DC
  Administered 2024-06-06 – 2024-06-07 (×2): 30 mg via ORAL
  Filled 2024-06-06 (×2): qty 1

## 2024-06-06 MED ORDER — AMLODIPINE BESYLATE 10 MG PO TABS
10.0000 mg | ORAL_TABLET | Freq: Every day | ORAL | Status: DC
  Administered 2024-06-07: 10 mg via ORAL
  Filled 2024-06-06: qty 1

## 2024-06-06 MED ORDER — HEPARIN BOLUS VIA INFUSION
3000.0000 [IU] | Freq: Once | INTRAVENOUS | Status: AC
  Administered 2024-06-06: 3000 [IU] via INTRAVENOUS
  Filled 2024-06-06: qty 3000

## 2024-06-06 MED ORDER — GABAPENTIN 300 MG PO CAPS
300.0000 mg | ORAL_CAPSULE | Freq: Two times a day (BID) | ORAL | Status: DC
  Administered 2024-06-06 – 2024-06-07 (×3): 300 mg via ORAL
  Filled 2024-06-06 (×3): qty 1

## 2024-06-06 MED ORDER — NITROGLYCERIN 0.4 MG SL SUBL: 0.4000 mg | SUBLINGUAL_TABLET | SUBLINGUAL | Status: DC | PRN

## 2024-06-06 MED ORDER — TRAZODONE HCL 100 MG PO TABS
100.0000 mg | ORAL_TABLET | Freq: Every day | ORAL | Status: DC
  Administered 2024-06-06: 100 mg via ORAL
  Filled 2024-06-06: qty 1

## 2024-06-06 MED ORDER — PERFLUTREN LIPID MICROSPHERE
1.0000 mL | INTRAVENOUS | Status: AC | PRN
  Administered 2024-06-06: 2 mL via INTRAVENOUS

## 2024-06-06 MED ORDER — ASPIRIN 81 MG PO TBEC
81.0000 mg | DELAYED_RELEASE_TABLET | Freq: Every day | ORAL | Status: DC
  Administered 2024-06-06 – 2024-06-07 (×2): 81 mg via ORAL
  Filled 2024-06-06 (×2): qty 1

## 2024-06-06 MED ORDER — ROSUVASTATIN CALCIUM 5 MG PO TABS
5.0000 mg | ORAL_TABLET | Freq: Every day | ORAL | Status: DC
  Administered 2024-06-06: 5 mg via ORAL
  Filled 2024-06-06 (×2): qty 1

## 2024-06-06 MED ORDER — IOHEXOL 350 MG/ML SOLN
75.0000 mL | Freq: Once | INTRAVENOUS | Status: AC | PRN
  Administered 2024-06-06: 75 mL via INTRAVENOUS

## 2024-06-06 MED ORDER — PANTOPRAZOLE SODIUM 40 MG PO TBEC
40.0000 mg | DELAYED_RELEASE_TABLET | Freq: Two times a day (BID) | ORAL | Status: DC
  Administered 2024-06-06 – 2024-06-07 (×3): 40 mg via ORAL
  Filled 2024-06-06 (×3): qty 1

## 2024-06-06 MED ORDER — MAGNESIUM SULFATE 2 GM/50ML IV SOLN
2.0000 g | Freq: Once | INTRAVENOUS | Status: AC
  Administered 2024-06-06: 2 g via INTRAVENOUS
  Filled 2024-06-06: qty 50

## 2024-06-06 MED ORDER — HEPARIN (PORCINE) 25000 UT/250ML-% IV SOLN
800.0000 [IU]/h | INTRAVENOUS | Status: DC
  Administered 2024-06-06: 800 [IU]/h via INTRAVENOUS
  Filled 2024-06-06: qty 250

## 2024-06-06 MED ORDER — INSULIN ASPART 100 UNIT/ML IJ SOLN
0.0000 [IU] | INTRAMUSCULAR | Status: DC
  Administered 2024-06-06 – 2024-06-07 (×5): 1 [IU] via SUBCUTANEOUS

## 2024-06-06 MED ORDER — AMLODIPINE BESYLATE 5 MG PO TABS
5.0000 mg | ORAL_TABLET | Freq: Every day | ORAL | Status: DC
  Administered 2024-06-06: 5 mg via ORAL
  Filled 2024-06-06: qty 1

## 2024-06-06 NOTE — Care Management Obs Status (Signed)
 MEDICARE OBSERVATION STATUS NOTIFICATION   Patient Details  Name: [name] MRN: [id number] Date of Birth: 1946/06/29   Medicare Observation Status Notification Given:  Yes    Nena LITTIE Coffee, RN 06/06/2024, 4:33 PM

## 2024-06-06 NOTE — Progress Notes (Addendum)
 Rounding Note   Patient Name: [name] Date of Encounter: 06/06/2024  [facility] Cardiologist: Lonni Cash, MD   Subjective  She was admitted overnight after presenting with syncope followed by chest pain. She sates she was standing and singing at church when she started to get lightheaded/ dizziness and clammy and then passed out. The next thing she remembers is the paramedics arriving. Following this, she started having some chest tightness that she described as a cramping sensation. She is still having some chest tightness that she describes as a 6/10 but she appears comfortable.   Scheduled Meds:  amLODipine   5 mg Oral Daily   aspirin  EC  81 mg Oral Daily   gabapentin   300 mg Oral BID   insulin  aspart  0-9 Units Subcutaneous Q4H   isosorbide  mononitrate  30 mg Oral Daily   pantoprazole   40 mg Oral BID   rosuvastatin   5 mg Oral q1800   traZODone   100 mg Oral QHS   Continuous Infusions:  heparin  800 Units/hr (06/06/24 0737)   PRN Meds: nitroGLYCERIN    Vital Signs  Vitals:   06/06/24 0630 06/06/24 0645 06/06/24 0734 06/06/24 0745  BP: 139/62 133/62  (!) 134/55  Pulse: (!) 53 (!) 50  (!) 54  Resp: 17 16  14   Temp:      TempSrc:      SpO2: 97% 95% 94% 97%  Weight:      Height:       No intake or output data in the 24 hours ending 06/06/24 0850    06/05/2024    1:28 PM 05/31/2024    2:42 PM 09/19/2023   11:16 AM  Last 3 Weights  Weight (lbs) 140 lb 140 lb 125 lb  Weight (kg) 63.504 kg 63.504 kg 56.7 kg      Telemetry Sinus bradycardia with rates int he 50s. - Personally Reviewed  ECG  No new ECG tracing today. - Personally Reviewed  Physical Exam  GEN: No acute distress.   Neck: No JVD. Cardiac: RRR. No murmurs, rubs, or gallops.  Respiratory: Clear to auscultation bilaterally. No wheezes, rhonchi, or rales. MS: No lower extremity edema. No deformity. Neuro:  No focal deficits.  Psych: Normal affect. Responds appropriately.    Labs High Sensitivity Troponin:   Recent Labs  Lab 06/05/24 1349 06/05/24 1530 06/05/24 1755 06/05/24 2126  TROPONINIHS 11 23* 22* 22*     Chemistry Recent Labs  Lab 06/05/24 1442 06/06/24 0500  NA 140 141  K 4.0 4.0  CL 109 108  CO2 20* 23  GLUCOSE 97 106*  BUN 12 10  CREATININE 0.79 0.79  CALCIUM  9.0 8.7*  MG  --  1.8  PROT  --  6.0*  ALBUMIN  --  3.1*  AST  --  23  ALT  --  16  ALKPHOS  --  83  BILITOT  --  0.3  GFRNONAA >60 >60  ANIONGAP 11 10    Lipids No results for input(s): CHOL, TRIG, HDL, LABVLDL, LDLCALC, CHOLHDL in the last 168 hours.  Hematology Recent Labs  Lab 06/05/24 1349 06/06/24 0500  WBC 6.7 5.8  RBC 4.76 4.45  HGB 14.5 13.6  HCT 43.9 41.3  MCV 92.2 92.8  MCH 30.5 30.6  MCHC 33.0 32.9  RDW 14.6 14.6  PLT 237 222   Thyroid   Recent Labs  Lab 06/06/24 0500  TSH 3.046    BNPNo results for input(s): BNP, PROBNP in the last 168 hours.  DDimer  No results for input(s): DDIMER in the last 168 hours.   Radiology  CT Angio Chest PE W and/or Wo Contrast Result Date: 06/06/2024 EXAM: CTA of the Chest with contrast for PE 06/05/2024 11:59:00 PM TECHNIQUE: CTA of the chest was performed after the administration of 75 mL of iohexol  (OMNIPAQUE ) 350 MG/ML injection. Multiplanar reformatted images are provided for review. MIP images are provided for review. Automated exposure control, iterative reconstruction, and/or weight based adjustment of the mA/kV was utilized to reduce the radiation dose to as low as reasonably achievable. COMPARISON: CT cardiac 07/16/2022. CLINICAL HISTORY: FINDINGS: PULMONARY ARTERIES: Pulmonary arteries are adequately opacified for evaluation. No pulmonary embolism. Main pulmonary artery is normal in caliber. MEDIASTINUM: The heart demonstrates mild calcified atherosclerotic disease of the coronary arteries. The pericardium demonstrates no acute abnormality. There is mild calcified atherosclerotic disease  of the aorta. There is mild diffuse esophageal wall thickening. LYMPH NODES: No mediastinal, hilar or axillary lymphadenopathy. LUNGS AND PLEURA: Mild emphysema present. A ground glass nodule in the right upper lobe measuring 5 mm (image 6/51) is unchanged from 07/16/2022. There is minimal bibasilar atelectasis. No pleural effusion or pneumothorax. UPPER ABDOMEN: Limited images of the upper abdomen are unremarkable. SOFT TISSUES AND BONES: No acute bone or soft tissue abnormality. IMPRESSION: 1. No evidence of pulmonary embolism. 2. Mild emphysema. 3. Stable 5 mm right upper lobe ground glass nodule. No routine follow-up imaging is recommended as per Fleischner Society Guidelines. Electronically signed by: Greig Pique MD 06/06/2024 12:11 AM EDT RP Workstation: [id number]   CT Head Wo Contrast Result Date: 06/05/2024 EXAM: CT HEAD WITHOUT CONTRAST 06/05/2024 02:25:53 PM TECHNIQUE: CT of the head was performed without the administration of intravenous contrast. Automated exposure control, iterative reconstruction, and/or weight based adjustment of the mA/kV was utilized to reduce the radiation dose to as low as reasonably achievable. COMPARISON: CT head 10/05/2002 CLINICAL HISTORY: Syncope/presyncope, cerebrovascular cause suspected. FINDINGS: BRAIN AND VENTRICLES: No acute hemorrhage. No evidence of acute infarct. No hydrocephalus. No extra-axial collection. No mass effect or midline shift. ORBITS: No acute abnormality. SINUSES: No acute abnormality. SOFT TISSUES AND SKULL: No acute soft tissue abnormality. No skull fracture. IMPRESSION: 1. No acute intracranial abnormality. Electronically signed by: Gilmore Molt MD 06/05/2024 02:33 PM EDT RP Workstation: [id number]   DG Chest Portable 1 View Result Date: 06/05/2024 CLINICAL DATA:  Chest pain. EXAM: PORTABLE CHEST 1 VIEW COMPARISON:  10/05/2022. FINDINGS: The heart is enlarged and the mediastinal contour is within normal limits. There is atherosclerotic  calcification of the aorta. Lung volumes are low. No consolidation, effusion, or pneumothorax is seen. Right shoulder arthroplasty changes and cervical spinal fusion hardware are unchanged. No acute osseous abnormality. IMPRESSION: No active disease. Electronically Signed   By: Leita Birmingham M.D.   On: 06/05/2024 14:15    Cardiac Studies  Echocardiogram 10/05/2022: 1. Left ventricular ejection fraction, by estimation, is 45%. The left  ventricle has mildly decreased function. The left ventricle demonstrates  regional wall motion abnormalities (see scoring diagram/findings for  description). Left ventricular diastolic   parameters are consistent with Grade I diastolic dysfunction (impaired  relaxation). The average left ventricular global longitudinal strain is  -13.0 %. The global longitudinal strain is abnormal.   2. Abnormal inferior and inferolateral regional strain.   3. Right ventricular systolic function is normal. The right ventricular  size is normal. There is normal pulmonary artery systolic pressure. The  estimated right ventricular systolic pressure is 17.0 mmHg.   4. The mitral valve is grossly  normal. Trivial mitral valve  regurgitation.   5. The aortic valve is tricuspid. Aortic valve regurgitation is not  visualized. No aortic stenosis is present.   6. The inferior vena cava is normal in size with greater than 50%  respiratory variability, suggesting right atrial pressure of 3 mmHg.  _______________  Left Cardiac Catheterization 10/06/2022:   Prox LAD lesion is 30% stenosed.   Ost 1st Diag lesion is 40% stenosed.   Prox RCA to Mid RCA lesion is 25% stenosed.  Impression: Ms. Sonnen has a widely patent RCA stent and otherwise no significant CAD. LVEDP was 15. I do not think that her chest pain was ischemically mediated. The sheath will be removed and pressure held in the groin to achieve hemostasis. The right radial sheath was removed and a TR band was placed to achieve  hemostasis. The patient left lab in stable condition. Dr. Hobart, the patient's attending cardiologist, was made aware of these results. I anticipate discharge later today.   Diagnostic Dominance: Right  _______________  Monitor 10/10/2022 to 10/23/2022:   Patch wear time was 13 days and 1 hour   Predominant rhythm is NSE with average HR 76bpm   There was 1 run of NSVT lasting 4 beats   3 runs of nonsustained SVT with longest lasting 4 beats   Rare SVE (<1%), rare VE (<1%)   Patient triggered events correlate with NSR and one PVC   Patient Profile   78 y.o. 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  Assessment & Plan   Chest Pain CAD Coronary Vasospasms Patient has a history of CAD with remote PCI to RCA and known coronary vasospasms with recurrent chest pain.  She was admitted after further evaluation of syncope and chest pain while singing at church. EKG showed normal sinus rhythm with no acute ischemic changes. High-sensitivity troponin minimally elevated at 11 >> 23 >> 22 >> 22. She had a similar event in 09/2022 and cardiac catheterization at that time showed patent stent to RCA and otherwise only minimal disease.  - She is currently having 6/10 chest pain that she describes as a chest tightness.  - Currently on IV Heparin . I think we can likely stop this but will discuss with MD. - Current antianginals: Amlodipine  5mg  daily and Imdur  30mg  daily. Will check orthostatics and will increase Amlodipine  to 10mg  if negative. - Continue aspirin  and statin. - Would hold off on a coronary CTA given prior RCA stent. Will start by updating an Echo. Will discuss whether any repeat ischemic evaluation is needed with MD. Sounds like this may just be her coronary  vasospasms.  Syncope Patient has a history of neurocardiogenic syncope. Prior monitor in 10/2022 showed one short run of NSVT, 3 short runs of SVT, and rare PACs/ PVCs but no significant arrhythmias. She now presents with a syncopal episode while singing a church that was preceded by lightheadedness/ dizziness and  clammy feeling.  - Will get an Echo. - No significant arrhythmias noted on telemetry so far. - Suspect she had a vasovagal event. However, will check orthostatics and continue to monitor on telemetry. Can consider another outpatient monitor depending on what inpatient work-up shows.   Chronic HFmrEF Last Echo in 09/2022 showed LVEF of 45% with akinesis of the entire lateral wall and  grade 1 diastolic dysfunction. - Euvolemic on exam.  - Spironolactone  listed under PTA medications but she reported not taking this.  - Will repeat Echo and then adjust GDMT if needed.  Hypertension BP mostly well controlled.  - Continue Amlodipine  and Imdur  as above.  Hyperlipidemia LDL 65 in 11/2022.  - Continue Crestor  5mg  daily.  - Will repeat fasting lipid panel this morning.  For questions or updates, please contact [facility] Please consult [email or web address] for contact info under     Signed, Callie E Goodrich, PA-C  06/06/2024, 8:50 AM    [name] was seen by me today along with Callie Goodrich, PA-C. I have personally performed an evaluation on this patient.  My findings are as follows: 78 y.o. female with history of CAD with prior PCI of the RCA, history coronary vasospasm, HFrEF with LvEF 45% in 2024, HTN, HLD admitted following syncopal event in church. Troponin with subtle elevation and flat (11 to 23 to 22 to 22).   Data: EKG(s) and pertinent labs, studies, etc were personally reviewed and interpreted by me:  Sinus, non-specific T wave abnormality Tele: sinus Labs reviewed by me Otherwise, I agree with data as outlined by the advanced practice provider.  Exam  performed by me: Gen: NAD Neck: No JVD Cardiac: RRR Lungs: Clear bilaterally Extremities: No LE edema  My Assessment and Plan:  CAD with chest pain: I do not think her troponin of 23 represents true ACS. EKG is without ischemic changes. Her chest pain occurred following a syncopal event. She continues to have episodes of chest pain that have not been relieved with Maalox.  Will stop IV heparin .  Continue Imdur  and Norvasc , ASA and statin Echo today.  Will follow today. If she continues having chest pain, can consider cardiac cath tomorrow to exclude progression of CAD.    Signed,  Lonni Cash, MD  06/06/2024 9:49 AM

## 2024-06-06 NOTE — Progress Notes (Signed)
 PHARMACY - ANTICOAGULATION CONSULT NOTE  Pharmacy Consult for heparin  Indication: chest pain/ACS  No Known Allergies  Patient Measurements: Height: 4' 11 (149.9 cm) Weight: 63.5 kg (140 lb) IBW/kg (Calculated) : 43.2 HEPARIN  DW (KG): 56.9  Vital Signs: Temp: 98.1 F (36.7 C) (10/27 0942) Temp Source: Oral (10/27 0942) BP: 117/90 (10/27 1030) Pulse Rate: 64 (10/27 1030)  Labs: Recent Labs    06/05/24 1349 06/05/24 1442 06/05/24 1530 06/05/24 1755 06/05/24 2126 06/06/24 0500 06/06/24 1034  HGB 14.5  --   --   --   --  13.6  --   HCT 43.9  --   --   --   --  41.3  --   PLT 237  --   --   --   --  222  --   HEPARINUNFRC  --   --   --   --   --   --  0.36  CREATININE  --  0.79  --   --   --  0.79  --   TROPONINIHS 11  --  23* 22* 22*  --   --     Estimated Creatinine Clearance: 46.9 mL/min (by C-G formula based on SCr of 0.79 mg/dL).   Medical History: Past Medical History:  Diagnosis Date   Anemia    Anginal pain    last cp was last year   Anxiety    Arthritis    Blood transfusion    Chest pain 02/04/2016   Coronary artery disease    Coronary artery spasm, wth continued episodes of chest pain.  09/26/2013   Coronary vasospasm    Diabetes mellitus without complication (HCC)    type II   GERD (gastroesophageal reflux disease)    Headache    Hiatal hernia    Hypercholesteremia    Hypertension    NSTEMI (non-ST elevated myocardial infarction) (HCC) not sure   NSVT (nonsustained ventricular tachycardia) (HCC) 09/26/2013   Panic attack    Assessment: 78yo female presents to ED after near-syncopal episode associated w/ CP, initial troponins mildly elevated and flat, to begin heparin  for ACS. No AC PTA.  Heparin  level is therapeutic at 0.36, on heparin  infusion at 800 units/hr. Hgb 13.6, plt 222. No s/sx of bleeding or infusion issues.  Goal of Therapy:  Heparin  level 0.3-0.7 units/ml Monitor platelets by anticoagulation protocol: Yes   Plan:  Discussed  with MD, okay to stop heparin  infusion   Thank you for allowing pharmacy to participate in this patient's care,  Suzen Sour, PharmD, BCCCP Clinical Pharmacist  Phone: [phone] 06/06/2024 12:24 PM  Please check AMION for all [facility] Pharmacy phone numbers After 10:00 PM, call Main Pharmacy [phone]

## 2024-06-06 NOTE — Progress Notes (Signed)
   06/06/24 1640  TOC Brief Assessment  Insurance and Status Reviewed  Patient has primary care physician Yes  Home environment has been reviewed From home c/husband  Prior level of function: Independent  Prior/Current Home Services No current home services  Social Drivers of Health Review SDOH reviewed interventions complete (smoking cessation added to AVS)  Readmission risk has been reviewed Yes  Transition of care needs no transition of care needs at this time   Transition of Care Department [facility]) has reviewed patient and will continue to monitor patient advancement. If new patient needs arise, please place a TOC consult.

## 2024-06-06 NOTE — Progress Notes (Signed)
 PHARMACY - ANTICOAGULATION CONSULT NOTE  Pharmacy Consult for heparin  Indication: chest pain/ACS  No Known Allergies  Patient Measurements: Height: 4' 11 (149.9 cm) Weight: 63.5 kg (140 lb) IBW/kg (Calculated) : 43.2 HEPARIN  DW (KG): 56.9  Vital Signs: Temp: 97.9 F (36.6 C) (10/27 0038) Temp Source: Oral (10/27 0038) BP: 123/70 (10/27 0100) Pulse Rate: 69 (10/27 0100)  Labs: Recent Labs    06/05/24 1349 06/05/24 1442 06/05/24 1530 06/05/24 1755 06/05/24 2126  HGB 14.5  --   --   --   --   HCT 43.9  --   --   --   --   PLT 237  --   --   --   --   CREATININE  --  0.79  --   --   --   TROPONINIHS 11  --  23* 22* 22*    Estimated Creatinine Clearance: 46.9 mL/min (by C-G formula based on SCr of 0.79 mg/dL).   Medical History: Past Medical History:  Diagnosis Date   Anemia    Anginal pain    last cp was last year   Anxiety    Arthritis    Blood transfusion    Chest pain 02/04/2016   Coronary artery disease    Coronary artery spasm, wth continued episodes of chest pain.  09/26/2013   Coronary vasospasm    Diabetes mellitus without complication (HCC)    type II   GERD (gastroesophageal reflux disease)    Headache    Hiatal hernia    Hypercholesteremia    Hypertension    NSTEMI (non-ST elevated myocardial infarction) (HCC) not sure   NSVT (nonsustained ventricular tachycardia) (HCC) 09/26/2013   Panic attack    Assessment: 78yo female presents to ED after near-syncopal episode associated w/ CP, initial troponins mildly elevated and flat, to begin heparin  for ACS.  Goal of Therapy:  Heparin  level 0.3-0.7 units/ml Monitor platelets by anticoagulation protocol: Yes   Plan:  Heparin  3000 units IV bolus followed by infusion at 800 units/hr. Monitor heparin  levels and CBC.  Marvetta Dauphin, PharmD, BCPS  06/06/2024,1:29 AM

## 2024-06-06 NOTE — ED Notes (Signed)
 Pt in no acute distress at this time. Pt is comfortable lying in the bed. Endorses chest pressure 6/10, constant. Denies any dizziness, shob, n/v, back pain, or headache.

## 2024-06-06 NOTE — Progress Notes (Signed)
 The patient is a 78 yr old woman who presented to [facility] ED on 06/05/2024 with a complaint of chest pain and an episode of syncope while she was singing in the choir. Her past medical history is significant for CAD status post PCI last cardiac cath on October 06, 2022 showing widely patent right RCA stent, chronic HFrEF last EF measured was in February 2024 showed EF of 45%, hypertension, and diabetes mellitus type 2. The patient stopped smoking about 3 weeks ago and started on Varniciline.   In the ED she was found to have troponins of 23 and 22. She was started on a heparin  infusion and cardiology was consulted from the ED. There was concern for unstable angina. CTA of the chest was negative for PE. CT head was negative for acute pathology.   The patient was admitted to a telemetry bed by my colleague, Dr. Rosary earlier this morning.  This morning the patient is resting quietly. No new complaints although she continues to complain of 6/10 chest pain. Heart and lung sounds were within normal limits. She was awake and alert and in no acute distress. No cyanosis, clubbing or edema of lower extrmeities bilaterally.  Echocardiogram has demonstrated an EF of 45% with mildly decreased function in the left ventricle. The left internal cavity was moderately to severely dilated. There was grade I diastolic dysfunction present. The RV systolic size and function was normal. PA pressure could not be evaluated.   Cardiology has stopped the heparin  drip. They have increased her amlodipine  to 10 mg daily. They will continue the aspirin  and the statin. They will hold off on the coronary CTA for now given the patient's prior RCA stent. They feel that her pain may be due to coronary vasospasm and that syncopal episode was due to a vasovagal event. They also wish to update the patient's GDMT following the echocardiogram. They also wish to continue the amlodipine  ( at the 10 mg dose) and continue the Imdur  as well. Lipid  panel has been ordered.

## 2024-06-06 NOTE — H&P (Addendum)
 History and Physical    Tanashia Ciesla FMW:[id number] DOB: [birth year] DOA: 06/05/2024  Patient coming from: Home.  Chief Complaint: Chest pain and loss of consciousness.  HPI: [name] is a 78 y.o. female with history of CAD status post PCI last cardiac cath on October 06, 2022 showing widely patent right RCA stent, chronic HFrEF last EF measured was in February 2024 showed EF of 45%, hypertension, diabetes mellitus type 2 presents to the ER after patient started having chest pain and loss of consciousness.  Patient states she was at the church and was singing in the choir when suddenly she felt nauseous and passed out.  When she regained consciousness EMS was already at the place.  And she started having crampy substernal chest pressure nonradiating with no associated shortness of breath.  Patient about 3 weeks ago stopped smoking and started on Varniciline.  ED Course: In the ER EKG shows normal sinus rhythm troponin were 23 and 22.  Cardiologist on-call was consulted started on heparin  infusion with concern for unstable angina and admitted for further workup.  CT angiogram of the chest was negative for PE.  CT head was unremarkable.  Creatinine is 0.7 hemoglobin was 14.5.  Review of Systems: As per HPI, rest all negative.   Past Medical History:  Diagnosis Date   Anemia    Anginal pain    last cp was last year   Anxiety    Arthritis    Blood transfusion    Chest pain 02/04/2016   Coronary artery disease    Coronary artery spasm, wth continued episodes of chest pain.  09/26/2013   Coronary vasospasm    Diabetes mellitus without complication (HCC)    type II   GERD (gastroesophageal reflux disease)    Headache    Hiatal hernia    Hypercholesteremia    Hypertension    NSTEMI (non-ST elevated myocardial infarction) (HCC) not sure   NSVT (nonsustained ventricular tachycardia) (HCC) 09/26/2013   Panic attack     Past Surgical History:  Procedure Laterality Date    ABDOMINAL HYSTERECTOMY     PARTIAL HYSTERECTOMY   ACDF N/A    C5-6 ACDF Dr. Alix   breast     right, tumor benign   BREAST EXCISIONAL BIOPSY Right    1961 (age 14) benign   CARDIAC CATHETERIZATION  2014   CARDIAC CATHETERIZATION  2012   CARDIAC CATHETERIZATION N/A 07/22/2016   Procedure: Left Heart Cath and Coronary Angiography;  Surgeon: Victory LELON Sharps, MD;  Location: [facility] INVASIVE CV LAB;  Service: Cardiovascular;  Laterality: N/A;   cardiac stent  2012   to RCA   CHOLECYSTECTOMY N/A 10/02/2017   Procedure: LAPAROSCOPIC CHOLECYSTECTOMY;  Surgeon: Rubin Calamity, MD;  Location: [facility] OR;  Service: General;  Laterality: N/A;   CORONARY ANGIOPLASTY     ESOPHAGEAL MANOMETRY N/A 10/29/2016   Procedure: ESOPHAGEAL MANOMETRY (EM);  Surgeon: Gladis MARLA Louder, MD;  Location: [facility] ENDOSCOPY;  Service: Endoscopy;  Laterality: N/A;   ESOPHAGOGASTRODUODENOSCOPY (EGD) WITH PROPOFOL  N/A 10/28/2016   Procedure: ESOPHAGOGASTRODUODENOSCOPY (EGD) WITH PROPOFOL ;  Surgeon: Gladis MARLA Louder, MD;  Location: [facility] ENDOSCOPY;  Service: Endoscopy;  Laterality: N/A;   EXCISION OF SKIN TAG N/A 07/29/2017   Procedure: EXCISION OF PERIANAL  SKIN TAG;  Surgeon: Rubin Calamity, MD;  Location: [facility] OR;  Service: General;  Laterality: N/A;   KNEE SURGERY  left   left ear surgery     for bad cut   LEFT HEART CATH AND CORONARY ANGIOGRAPHY N/A  10/06/2022   Procedure: LEFT HEART CATH AND CORONARY ANGIOGRAPHY;  Surgeon: Court Dorn PARAS, MD;  Location: [facility] INVASIVE CV LAB;  Service: Cardiovascular;  Laterality: N/A;   LEFT HEART CATHETERIZATION WITH CORONARY ANGIOGRAM N/A 02/22/2013   Procedure: LEFT HEART CATHETERIZATION WITH CORONARY ANGIOGRAM;  Surgeon: Victory LELON Claudene DOUGLAS, MD;  Location[facility] CATH LAB;  Service: Cardiovascular;  Laterality: N/A;   REVERSE SHOULDER ARTHROPLASTY Right 04/07/2022   Procedure: RIGHT SHOULDER REVERSE SHOULDER ARTHROPLASTY;  Surgeon: Genelle Standing, MD;  Location: [facility] OR;  Service: Orthopedics;   Laterality: Right;   SHOULDER SURGERY Bilateral    rotator cuff   TOE SURGERY Left    toe surgery     reports that she has been smoking cigarettes. She started smoking about 48 years ago. She has a 10 pack-year smoking history. She has never used smokeless tobacco. She reports current alcohol use of about 1.0 standard drink of alcohol per week. She reports that she does not use drugs.  No Known Allergies  Family History  Problem Relation Age of Onset   Heart failure Mother    Stroke Sister    Heart attack Other     Prior to Admission medications   Medication Sig Start Date End Date Taking? Authorizing Provider  amLODipine  (NORVASC ) 5 MG tablet Take 1 tablet (5 mg total) by mouth daily. 11/04/22  Yes Lucien Orren SAILOR, PA-C  aspirin  EC 81 MG tablet Take 81 mg by mouth daily. Swallow whole.   Yes [provider]  gabapentin  (NEURONTIN ) 300 MG capsule Take 300 mg by mouth 2 (two) times daily. 05/17/24  Yes [provider]  isosorbide  mononitrate (IMDUR ) 30 MG 24 hr tablet Take 1 tablet (30 mg total) by mouth daily. 05/31/24  Yes Verlin Lonni BIRCH, MD  meloxicam  (MOBIC ) 15 MG tablet TAKE 1 TABLET(15 MG) BY MOUTH DAILY 07/29/23  Yes Genelle Standing, MD  metFORMIN (GLUCOPHAGE-XR) 500 MG 24 hr tablet Take 500 mg by mouth 2 (two) times daily. 06/28/20  Yes [provider]  nitroGLYCERIN  (NITROSTAT ) 0.4 MG SL tablet PLACE 1 TABLET UNDER TONGUE AS NEEDED FOR CHEST PAIN EVERY 5 MINUTES UP TO 3 DOSES THEN SEEK MEDICAL ATTENTION. Please make overdue appt. 1st attempt 02/18/19  Yes Claudene Victory LELON, MD  pantoprazole  (PROTONIX ) 40 MG tablet Take 1 tablet (40 mg total) by mouth 2 (two) times daily. 04/20/14  Yes Marylu Leita SAUNDERS, NP  rosuvastatin  (CRESTOR ) 5 MG tablet Take 1 tablet (5 mg total) by mouth daily at 6 PM. 01/24/17  Yes Sebastian Toribio GAILS, MD  traZODone  (DESYREL ) 100 MG tablet Take 100 mg by mouth at bedtime. 04/19/24  Yes [provider]  varenicline (CHANTIX)  0.5 MG tablet Take 0.5 mg by mouth 2 (two) times daily. 04/19/24  Yes [provider]  Vitamin D , Ergocalciferol , (DRISDOL) 1.25 MG (50000 UNIT) CAPS capsule Take 50,000 Units by mouth once a week. Sundays 04/20/24  Yes [provider]  ACCU-CHEK GUIDE test strip USE TO CHECK BLOOD SUGAR DAILY 11/05/20   [provider]  Accu-Chek Softclix Lancets lancets daily. 11/05/20   [provider]  spironolactone  (ALDACTONE ) 25 MG tablet TAKE 1/2 TABLET(12.5 MG) BY MOUTH DAILY Patient not taking: Reported on 05/31/2024 03/31/24   Lucien Orren SAILOR, PA-C    Physical Exam: Constitutional: Moderately built and nourished. Vitals:   06/05/24 2330 06/06/24 0038 06/06/24 0100 06/06/24 0230  BP: 137/64 (!) 121/55 123/70 (!) 119/52  Pulse: (!) 58 63 69 (!) 56  Resp: 18  19 (!) 21 17  Temp:  97.9 F (36.6 C)    TempSrc:  Oral    SpO2: 95% 94% 92% 94%  Weight:      Height:       Eyes: Anicteric no pallor. ENMT: No discharge from the ears eyes nose or mouth. Neck: No mass felt.  No neck rigidity. Respiratory: No rhonchi or crepitations. Cardiovascular: S1-S2 heard. Abdomen: Soft nontender bowel sound present. Musculoskeletal: No edema. Skin: No rash. Neurologic: Alert awake oriented to time place and person.  Moves all extremities. Psychiatric: Appears normal.  Normal affect.   Labs on Admission: I have personally reviewed following labs and imaging studies  CBC: Recent Labs  Lab 06/05/24 1349  WBC 6.7  HGB 14.5  HCT 43.9  MCV 92.2  PLT 237   Basic Metabolic Panel: Recent Labs  Lab 06/05/24 1442  NA 140  K 4.0  CL 109  CO2 20*  GLUCOSE 97  BUN 12  CREATININE 0.79  CALCIUM  9.0   GFR: Estimated Creatinine Clearance: 46.9 mL/min (by C-G formula based on SCr of 0.79 mg/dL). Liver Function Tests: No results for input(s): AST, ALT, ALKPHOS, BILITOT, PROT, ALBUMIN in the last 168 hours. No results for input(s): LIPASE, AMYLASE in the last  168 hours. No results for input(s): AMMONIA in the last 168 hours. Coagulation Profile: No results for input(s): INR, PROTIME in the last 168 hours. Cardiac Enzymes: No results for input(s): CKTOTAL, CKMB, CKMBINDEX, TROPONINI in the last 168 hours. BNP (last 3 results) No results for input(s): PROBNP in the last 8760 hours. HbA1C: No results for input(s): HGBA1C in the last 72 hours. CBG: Recent Labs  Lab 06/05/24 1331  GLUCAP 104*   Lipid Profile: No results for input(s): CHOL, HDL, LDLCALC, TRIG, CHOLHDL, LDLDIRECT in the last 72 hours. Thyroid  Function Tests: No results for input(s): TSH, T4TOTAL, FREET4, T3FREE, THYROIDAB in the last 72 hours. Anemia Panel: No results for input(s): VITAMINB12, FOLATE, FERRITIN, TIBC, IRON, RETICCTPCT in the last 72 hours. Urine analysis:    Component Value Date/Time   COLORURINE YELLOW 10/29/2020 1357   APPEARANCEUR CLEAR 10/29/2020 1357   LABSPEC >1.046 (H) 10/29/2020 1357   PHURINE 5.0 10/29/2020 1357   GLUCOSEU NEGATIVE 10/29/2020 1357   HGBUR NEGATIVE 10/29/2020 1357   BILIRUBINUR NEGATIVE 10/29/2020 1357   KETONESUR NEGATIVE 10/29/2020 1357   PROTEINUR NEGATIVE 10/29/2020 1357   UROBILINOGEN 1.0 03/09/2013 1215   NITRITE NEGATIVE 10/29/2020 1357   LEUKOCYTESUR MODERATE (A) 10/29/2020 1357   Sepsis Labs: @LABRCNTIP (procalcitonin:4,lacticidven:4) )No results found for this or any previous visit (from the past 240 hours).   Radiological Exams on Admission: CT Angio Chest PE W and/or Wo Contrast Result Date: 06/06/2024 EXAM: CTA of the Chest with contrast for PE 06/05/2024 11:59:00 PM TECHNIQUE: CTA of the chest was performed after the administration of 75 mL of iohexol  (OMNIPAQUE ) 350 MG/ML injection. Multiplanar reformatted images are provided for review. MIP images are provided for review. Automated exposure control, iterative reconstruction, and/or weight based adjustment of  the mA/kV was utilized to reduce the radiation dose to as low as reasonably achievable. COMPARISON: CT cardiac 07/16/2022. CLINICAL HISTORY: FINDINGS: PULMONARY ARTERIES: Pulmonary arteries are adequately opacified for evaluation. No pulmonary embolism. Main pulmonary artery is normal in caliber. MEDIASTINUM: The heart demonstrates mild calcified atherosclerotic disease of the coronary arteries. The pericardium demonstrates no acute abnormality. There is mild calcified atherosclerotic disease of the aorta. There is mild diffuse esophageal wall thickening. LYMPH NODES: No mediastinal, hilar or axillary  lymphadenopathy. LUNGS AND PLEURA: Mild emphysema present. A ground glass nodule in the right upper lobe measuring 5 mm (image 6/51) is unchanged from 07/16/2022. There is minimal bibasilar atelectasis. No pleural effusion or pneumothorax. UPPER ABDOMEN: Limited images of the upper abdomen are unremarkable. SOFT TISSUES AND BONES: No acute bone or soft tissue abnormality. IMPRESSION: 1. No evidence of pulmonary embolism. 2. Mild emphysema. 3. Stable 5 mm right upper lobe ground glass nodule. No routine follow-up imaging is recommended as per Fleischner Society Guidelines. Electronically signed by: Greig Pique MD 06/06/2024 12:11 AM EDT RP Workstation: [id number]   CT Head Wo Contrast Result Date: 06/05/2024 EXAM: CT HEAD WITHOUT CONTRAST 06/05/2024 02:25:53 PM TECHNIQUE: CT of the head was performed without the administration of intravenous contrast. Automated exposure control, iterative reconstruction, and/or weight based adjustment of the mA/kV was utilized to reduce the radiation dose to as low as reasonably achievable. COMPARISON: CT head 10/05/2002 CLINICAL HISTORY: Syncope/presyncope, cerebrovascular cause suspected. FINDINGS: BRAIN AND VENTRICLES: No acute hemorrhage. No evidence of acute infarct. No hydrocephalus. No extra-axial collection. No mass effect or midline shift. ORBITS: No acute abnormality.  SINUSES: No acute abnormality. SOFT TISSUES AND SKULL: No acute soft tissue abnormality. No skull fracture. IMPRESSION: 1. No acute intracranial abnormality. Electronically signed by: Gilmore Molt MD 06/05/2024 02:33 PM EDT RP Workstation: [id number]   DG Chest Portable 1 View Result Date: 06/05/2024 CLINICAL DATA:  Chest pain. EXAM: PORTABLE CHEST 1 VIEW COMPARISON:  10/05/2022. FINDINGS: The heart is enlarged and the mediastinal contour is within normal limits. There is atherosclerotic calcification of the aorta. Lung volumes are low. No consolidation, effusion, or pneumothorax is seen. Right shoulder arthroplasty changes and cervical spinal fusion hardware are unchanged. No acute osseous abnormality. IMPRESSION: No active disease. Electronically Signed   By: Leita Birmingham M.D.   On: 06/05/2024 14:15    EKG: Independently reviewed.  Normal sinus rhythm.  Assessment/Plan Principal Problem:   Non-ST elevated myocardial infarction (non-STEMI) (HCC) Active Problems:   HTN (hypertension)   GERD (gastroesophageal reflux disease)   S/P angioplasty with stent   Syncope   Non-ST elevation MI (NSTEMI) (HCC)    Unstable angina -  patient's symptoms are concerning for unstable angina.  Appreciate cardiology consult.  Last cardiac catheter was in February 2024 at that time showed patent RCA stent.  Presently on heparin  infusion, statins and Imdur . Syncope could be vasovagal given that patient was nauseous prior to the episode.  Will closely monitor telemetry given history of CAD.  Cardiology following. History of chronic HFrEF last EF measured was 45% in February 2024.  Appears compensated.  Check 2D echo. Diabetes mellitus type 2 takes metformin at home.  Presently on sliding scale coverage.  Last hemoglobin A1c was 6.6 about a year ago. Emphysematous changes seen on the CT scan will need follow-up.  Presently not wheezing. GERD on PPI. Diabetic neuropathy on gabapentin . Lung nodule on the CAT  scan follow-up as outpatient.  Since patient has unstable angina will need further workup and more than 2 midnight stay.   DVT prophylaxis: Heparin  infusion. Code Status: Full code. Family Communication: Discussed with patient. Disposition Plan: Cardiac telemetry. Consults called: Cardiology. Admission status: Observation.

## 2024-06-07 DIAGNOSIS — I25110 Atherosclerotic heart disease of native coronary artery with unstable angina pectoris: Secondary | ICD-10-CM | POA: Diagnosis not present

## 2024-06-07 DIAGNOSIS — I25118 Atherosclerotic heart disease of native coronary artery with other forms of angina pectoris: Secondary | ICD-10-CM | POA: Diagnosis not present

## 2024-06-07 DIAGNOSIS — I10 Essential (primary) hypertension: Secondary | ICD-10-CM | POA: Diagnosis not present

## 2024-06-07 DIAGNOSIS — R079 Chest pain, unspecified: Secondary | ICD-10-CM | POA: Diagnosis not present

## 2024-06-07 DIAGNOSIS — R55 Syncope and collapse: Secondary | ICD-10-CM | POA: Diagnosis not present

## 2024-06-07 DIAGNOSIS — I214 Non-ST elevation (NSTEMI) myocardial infarction: Secondary | ICD-10-CM | POA: Diagnosis not present

## 2024-06-07 DIAGNOSIS — E785 Hyperlipidemia, unspecified: Secondary | ICD-10-CM | POA: Diagnosis not present

## 2024-06-07 LAB — CBC
HCT: 39.4 % (ref 36.0–46.0)
Hemoglobin: 13 g/dL (ref 12.0–15.0)
MCH: 30.8 pg (ref 26.0–34.0)
MCHC: 33 g/dL (ref 30.0–36.0)
MCV: 93.4 fL (ref 80.0–100.0)
Platelets: 203 K/uL (ref 150–400)
RBC: 4.22 MIL/uL (ref 3.87–5.11)
RDW: 14.6 % (ref 11.5–15.5)
WBC: 5.4 K/uL (ref 4.0–10.5)
nRBC: 0 % (ref 0.0–0.2)

## 2024-06-07 LAB — BASIC METABOLIC PANEL WITH GFR
Anion gap: 11 (ref 5–15)
BUN: 15 mg/dL (ref 8–23)
CO2: 21 mmol/L — ABNORMAL LOW (ref 22–32)
Calcium: 8.7 mg/dL — ABNORMAL LOW (ref 8.9–10.3)
Chloride: 107 mmol/L (ref 98–111)
Creatinine, Ser: 0.92 mg/dL (ref 0.44–1.00)
GFR, Estimated: >60 mL/min (ref >60)
Glucose, Bld: 122 mg/dL — ABNORMAL HIGH (ref 70–99)
Potassium: 4.3 mmol/L (ref 3.5–5.1)
Sodium: 139 mmol/L (ref 135–145)

## 2024-06-07 LAB — GLUCOSE, CAPILLARY
Glucose-Capillary: 115 mg/dL — ABNORMAL HIGH (ref 70–99)
Glucose-Capillary: 130 mg/dL — ABNORMAL HIGH (ref 70–99)
Glucose-Capillary: 132 mg/dL — ABNORMAL HIGH (ref 70–99)
Glucose-Capillary: 140 mg/dL — ABNORMAL HIGH (ref 70–99)
Glucose-Capillary: 142 mg/dL — ABNORMAL HIGH (ref 70–99)

## 2024-06-07 MED ORDER — AMLODIPINE BESYLATE 10 MG PO TABS: 10.0000 mg | ORAL_TABLET | Freq: Every day | ORAL | 0 refills | Status: AC

## 2024-06-07 NOTE — Progress Notes (Signed)
 Reviewed AVS, patient expressed understanding of medications, MD follow up reviewed.   Removed IV, Site clean, dry and intact.  CCMD contacted and informed patients is being discharged.  Patient states all belongings brought to the hospital at time of admission are accounted for and packed to take home.  Patient informed and expressed understanding where to pick up discharge medications.  [facility] contacted to transport patient to entrance A,where family member was waiting in vehicle to transport home.

## 2024-06-07 NOTE — Plan of Care (Signed)

## 2024-06-07 NOTE — Progress Notes (Addendum)
 Progress Note  Patient Name: [name] Date of Encounter: 06/07/2024 [facility] Cardiologist: Jillian Cash, MD   Interval Summary    Still with some chest cramping, also with chest wall tenderness with palpitation to left anterior chest.   Vital Signs Vitals:   06/06/24 2028 06/07/24 0019 06/07/24 0408 06/07/24 0753  BP: 128/67 135/64 110/70 132/64  Pulse: (!) 58 69 86 64  Resp: 20 18 17 20   Temp: 98.4 F (36.9 C) 97.8 F (36.6 C) 97.7 F (36.5 C) 98.1 F (36.7 C)  TempSrc: Oral Oral Oral Oral  SpO2: 97% 95% 96% 97%  Weight: 64.7 kg     Height: 4' 11 (1.499 m)       Intake/Output Summary (Last 24 hours) at 06/07/2024 1039 Last data filed at 06/07/2024 0409 Gross per 24 hour  Intake 158.98 ml  Output 0 ml  Net 158.98 ml      06/06/2024    8:28 PM 06/05/2024    1:28 PM 05/31/2024    2:42 PM  Last 3 Weights  Weight (lbs) 142 lb 11.2 oz 140 lb 140 lb  Weight (kg) 64.728 kg 63.504 kg 63.504 kg      Telemetry/ECG  Sinus Rhythm, some episodes of bradycardia yesterday - Personally Reviewed  Physical Exam  GEN: No acute distress.   Neck: No JVD Cardiac: RRR, no murmurs, rubs, or gallops.  Respiratory: Clear to auscultation bilaterally. GI: Soft, nontender, non-distended  MS: No edema  Assessment & Plan   78 y.o. 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   Chest Pain CAD Coronary Vasospasms -- Patient has a history of CAD with remote PCI to RCA and known coronary vasospasms with recurrent chest pain.  She was admitted after further evaluation of syncope and chest pain while singing at church.  -- EKG showed normal sinus rhythm with no acute ischemic changes. High-sensitivity troponin minimally elevated at 11 >>  23 >> 22 >> 22. She had a similar event in 09/2022 and cardiac catheterization at that time showed patent stent to RCA and otherwise only minimal disease.  -- initially treated with IV heparin , stopped yesterday -- echo showed continued LVEF of 45%, akinetic of lateral wall, hypokinesis of anterior wall, septum and inferior wall  -- continue Amlodipine  10mg  daily, Imdur  30mg  daily, aspirin  and statin. -- she is tender to palpitation on the left anterior chest this morning, suspect MSK component to pain, given echo findings will review with MD    Syncope -- history of neurocardiogenic syncope. Prior monitor in 10/2022 showed one short run of NSVT, 3 short runs of SVT, and rare PACs/ PVCs but no significant arrhythmias.  -- She now presented with a syncopal episode while singing a church that was preceded by lightheadedness/ dizziness and  clammy feeling.  -- echo without significant change -- No significant arrhythmias noted on telemetry so far. -- suspect vagal episode    Chronic HFmrEF -- Last Echo in 09/2022 showed LVEF of 45% with akinesis of the entire lateral wall and grade 1 diastolic dysfunction. -- Echo 10/27 LVEF of 45%, akinetic of lateral wall, hypokinesis of anterior wall, septum and inferior wall  -- Spironolactone  listed under PTA medications but she reported not taking this.  --    Hypertension -- BP mostly well controlled.  --  Continue Amlodipine  and Imdur  as above   Hyperlipidemia -- LDL 38, HDL 71 -- Continue Crestor  5mg  daily.   For questions or updates, please contact [facility] Please consult [email or web address] for contact info under       Signed, Manuelita Rummer, NP   [name] was seen by me today along with Manuelita Rummer, NP. I have personally performed an evaluation on this patient.  My findings are as follows: 78 y.o. female with history of CAD s/p remote RCA PCI, coronary vasospasm, HFrEF, HTN, syncope felt to be vasovagal in the past, HTN,  HLD admitted post syncopal event with chest pain. No significant troponin elevation.   Data: EKG(s) and pertinent labs, studies, etc were personally reviewed and interpreted by me:  No am EKG Tele: sinus Labs reviewed Echo reviewed, unchanged. EF 45% with lateral wall akinesis.  Otherwise, I agree with data as outlined by the advanced practice provider.  Exam performed by me: Gen: NAD Neck: No JVD Cardiac: RRR no murmurs Lungs: clear bilaterally Extremities: no LE edema  My Assessment and Plan:  CAD with chest pain: Her chest pain is atypical and not felt to be cardiac related. Her left chest wall is tender to palpation. Echo unchanged from 2024. Continue medical mgmt of CAD. No ischemic workup is indicated. Continue Norvasc , Imdur , ASA and statin.   Syncope: suspect vagal event. No further workup  OK to d/c home today.   Signed,  Jillian Cash, MD  06/07/2024 11:08 AM

## 2024-06-08 DIAGNOSIS — I214 Non-ST elevation (NSTEMI) myocardial infarction: Secondary | ICD-10-CM | POA: Diagnosis not present

## 2024-06-08 NOTE — Discharge Summary (Signed)
 Physician Discharge Summary   Patient: [name] MRN: [id number] DOB: 04-Oct-1945  Admit date:     06/05/2024  Discharge date: 06/07/2024  Discharge Physician: Brigida Bureau   PCP: Ransom Other, MD   Recommendations at discharge:    Discharge to home Follow up with PCP in 7-10 days.  Discharge Diagnoses: Principal Problem:   Non-ST elevated myocardial infarction (non-STEMI) (HCC) Active Problems:   HTN (hypertension)   GERD (gastroesophageal reflux disease)   S/P angioplasty with stent   Syncope   Non-ST elevation MI (NSTEMI) (HCC)   Emphysema lung (HCC)  Resolved Problems:   * No resolved hospital problems. [facility] Course:  The patient is a 78 yr old woman who presented to [facility] ED on 06/05/2024 with a complaint of chest pain and an episode of syncope while she was singing in the choir. Her past medical history is significant for CAD status post PCI last cardiac cath on October 06, 2022 showing widely patent right RCA stent, chronic HFrEF last EF measured was in February 2024 showed EF of 45%, hypertension, and diabetes mellitus type 2. The patient stopped smoking about 3 weeks ago and started on Varniciline.    In the ED she was found to have troponins of 23 and 22. She was started on a heparin  infusion and cardiology was consulted from the ED. There was concern for unstable angina. CTA of the chest was negative for PE. CT head was negative for acute pathology.    The patient was admitted to a telemetry bed.   On the morning of 06/06/2024 the patient is resting quietly. No new complaints although she continues to complain of 6/10 chest pain. Heart and lung sounds were within normal limits. She was awake and alert and in no acute distress. No cyanosis, clubbing or edema of lower extrmeities bilaterally.   Echocardiogram has demonstrated an EF of 45% with mildly decreased function in the left ventricle. The left internal cavity was moderately to severely dilated. There was  grade I diastolic dysfunction present. The RV systolic size and function was normal. PA pressure could not be evaluated.    Cardiology has stopped the heparin  drip. They have increased her amlodipine  to 10 mg daily. They will continue the aspirin  and the statin. They will hold off on the coronary CTA for now given the patient's prior RCA stent. They feel that her pain may be due to coronary vasospasm and that syncopal episode was due to a vasovagal event. They also wish to update the patient's GDMT following the echocardiogram. They also wish to continue the amlodipine  ( at the 10 mg dose) and continue the Imdur  as well. She was also started on a statin. Cardiology felt that the patient's chest pain was most likely musculoskeletal in origin.  The patient's syncope was felt to be vasovagal syncope. The patient has a known history of neurocardiogenic syncope. There were no significant instances of arrhythmia on the patient's telemetry.  Cardiology determined that no ischemic work up was necessary, and the patient was cleared for discharge to home.      Assessment and Plan: Unstable angina -  patient's symptoms are concerning for unstable angina.  Appreciate cardiology consult.  Last cardiac catheter was in February 2024 at that time showed patent RCA stent.  Heparin  drip initiated in the ED was stopped. The patient will be discharged on Imdur , amlodipine , aspirin , and a statin. No further ischemic work up is necessary per cardiology. Syncope Likely vasovagal per cardiology History of  chronic HFrEF last EF measured was 45% in February 2024.  Appears compensated.  Echocardiogram has demonstrated an EF of 45% with mildly decreased function in the left ventricle. The left internal cavity was moderately to severely dilated. There was grade I diastolic dysfunction present. The RV systolic size and function was normal. PA pressure could not be evaluated. Spironolactone  was stopped. Diabetes mellitus type 2  takes metformin at home.  Presently on sliding scale coverage.  Last hemoglobin A1c was 6.6 about a year ago. Emphysematous changes seen on the CT scan will need follow-up.  Presently not wheezing. GERD on PPI. Diabetic neuropathy on gabapentin . Lung nodule on the CAT scan follow-up as outpatient.   Since patient has unstable angina will need further workup and more than 2 midnight stay.     DVT prophylaxis: Heparin  infusion. Code Status: Full code. Family Communication: Discussed with patient. Disposition Plan: Cardiac telemetry. Consults called: Cardiology. Admission status: Observation.    Consultants: Cardiology Procedures performed: None  Disposition: Home Diet recommendation:  Discharge Diet Orders (From admission, onward)     Start     Ordered   06/07/24 0000  Diet - low sodium heart healthy        06/07/24 1703   06/07/24 0000  Diet Carb Modified        06/07/24 1703           Cardiac diet DISCHARGE MEDICATION: Allergies as of 06/07/2024   No Known Allergies      Medication List     STOP taking these medications    spironolactone  25 MG tablet Commonly known as: ALDACTONE        TAKE these medications    Accu-Chek Guide test strip Generic drug: glucose blood USE TO CHECK BLOOD SUGAR DAILY   Accu-Chek Softclix Lancets lancets daily.   amLODipine  10 MG tablet Commonly known as: NORVASC  Take 1 tablet (10 mg total) by mouth daily. What changed:  medication strength how much to take   aspirin  EC 81 MG tablet Take 81 mg by mouth daily. Swallow whole.   gabapentin  300 MG capsule Commonly known as: NEURONTIN  Take 300 mg by mouth 2 (two) times daily.   isosorbide  mononitrate 30 MG 24 hr tablet Commonly known as: IMDUR  Take 1 tablet (30 mg total) by mouth daily.   meloxicam  15 MG tablet Commonly known as: MOBIC  TAKE 1 TABLET(15 MG) BY MOUTH DAILY   metFORMIN 500 MG 24 hr tablet Commonly known as: GLUCOPHAGE-XR Take 500 mg by mouth 2  (two) times daily.   nitroGLYCERIN  0.4 MG SL tablet Commonly known as: NITROSTAT  PLACE 1 TABLET UNDER TONGUE AS NEEDED FOR CHEST PAIN EVERY 5 MINUTES UP TO 3 DOSES THEN SEEK MEDICAL ATTENTION. Please make overdue appt. 1st attempt   pantoprazole  40 MG tablet Commonly known as: PROTONIX  Take 1 tablet (40 mg total) by mouth 2 (two) times daily.   rosuvastatin  5 MG tablet Commonly known as: CRESTOR  Take 1 tablet (5 mg total) by mouth daily at 6 PM.   traZODone  100 MG tablet Commonly known as: DESYREL  Take 100 mg by mouth at bedtime.   varenicline 0.5 MG tablet Commonly known as: CHANTIX Take 0.5 mg by mouth 2 (two) times daily.   Vitamin D  (Ergocalciferol ) 1.25 MG (50000 UNIT) Caps capsule Commonly known as: DRISDOL Take 50,000 Units by mouth once a week. Sundays        Discharge Exam: Filed Weights   06/05/24 1328 06/06/24 2028  Weight: 63.5 kg 64.7 kg  Exam:  Constitutional:  The patient is awake, alert, and oriented x 3. No acute distress. Eyes:  pupils and irises appear normal Normal lids and conjunctivae ENMT:  grossly normal hearing  Lips appear normal external ears, nose appear normal Oropharynx: mucosa, tongue,posterior pharynx appear normal Neck:  neck appears normal, no masses, normal ROM, supple no thyromegaly Respiratory:  No increased work of breathing. No wheezes, rales, or rhonchi No tactile fremitus Cardiovascular:  Regular rate and rhythm No murmurs, ectopy, or gallups. No lateral PMI. No thrills. Abdomen:  Abdomen is soft, non-tender, non-distended No hernias, masses, or organomegaly Normoactive bowel sounds.  Musculoskeletal:  No cyanosis, clubbing, or edema Skin:  No rashes, lesions, ulcers palpation of skin: no induration or nodules Neurologic:  CN 2-12 intact Sensation all 4 extremities intact Psychiatric:  Mental status Mood, affect appropriate Orientation to person, place, time  judgment and insight appear  intact   Condition at discharge: fair  The results of significant diagnostics from this hospitalization (including imaging, microbiology, ancillary and laboratory) are listed below for reference.   Imaging Studies: ECHOCARDIOGRAM COMPLETE Result Date: 06/06/2024    ECHOCARDIOGRAM REPORT   Patient Name:   TAJANAY HURLEY Date of Exam: 06/06/2024 Medical Rec #:  [id number]       Height:       59.0 in Accession #:    [id number]      Weight:       140.0 lb Date of Birth:  1946-01-20       BSA:          1.585 m Patient Age:    78 years        BP:           122/55 mmHg Patient Gender: F               HR:           57 bpm. Exam Location:  Inpatient Procedure: 2D Echo and Intracardiac Opacification Agent (Both Spectral and Color            Flow Doppler were utilized during procedure). Indications:    Chest pain  History:        Patient has prior history of Echocardiogram examinations. CAD                 and Previous Myocardial Infarction; Risk Factors:Hypertension.  Sonographer:    Charmaine Gaskins Referring Phys: [id number] CHIQUITA DASEN SCHWENNESEN IMPRESSIONS  1. Left ventricular ejection fraction, by estimation, is 45%. The left ventricle has mildly decreased function. The left ventricle demonstrates regional wall motion abnormalities (see scoring diagram/findings for description). The left ventricular internal cavity size was moderately to severely dilated. Left ventricular diastolic parameters are consistent with Grade I diastolic dysfunction (impaired relaxation).  2. Right ventricular systolic function is normal. The right ventricular size is normal. Tricuspid regurgitation signal is inadequate for assessing PA pressure.  3. The mitral valve is normal in structure. Mild mitral valve regurgitation. No evidence of mitral stenosis.  4. The aortic valve is normal in structure. Aortic valve regurgitation is not visualized. No aortic stenosis is present.  5. The inferior vena cava is normal in size with greater than 50%  respiratory variability, suggesting right atrial pressure of 3 mmHg. Comparison(s): No significant change from prior study. FINDINGS  Left Ventricle: Left ventricular ejection fraction, by estimation, is 45%. The left ventricle has mildly decreased function. The left ventricle demonstrates regional wall motion abnormalities. Definity contrast agent was given IV to delineate the  left ventricular endocardial borders. The left ventricular internal cavity size was moderately to severely dilated. There is no left ventricular hypertrophy. Left ventricular diastolic parameters are consistent with Grade I diastolic dysfunction (impaired relaxation).  LV Wall Scoring: The entire lateral wall is akinetic. The entire anterior wall, entire septum, entire inferior wall, and apex are hypokinetic. Right Ventricle: The right ventricular size is normal. No increase in right ventricular wall thickness. Right ventricular systolic function is normal. Tricuspid regurgitation signal is inadequate for assessing PA pressure. Left Atrium: Left atrial size was normal in size. Right Atrium: Right atrial size was normal in size. Pericardium: There is no evidence of pericardial effusion. Mitral Valve: The mitral valve is normal in structure. Mild mitral valve regurgitation. No evidence of mitral valve stenosis. Tricuspid Valve: The tricuspid valve is normal in structure. Tricuspid valve regurgitation is trivial. No evidence of tricuspid stenosis. Aortic Valve: The aortic valve is normal in structure. Aortic valve regurgitation is not visualized. No aortic stenosis is present. Aortic valve mean gradient measures 3.0 mmHg. Aortic valve peak gradient measures 7.0 mmHg. Aortic valve area, by VTI measures 2.48 cm. Pulmonic Valve: The pulmonic valve was normal in structure. Pulmonic valve regurgitation is trivial. No evidence of pulmonic stenosis. Aorta: The aortic root and ascending aorta are structurally normal, with no evidence of dilitation.  Venous: The inferior vena cava is normal in size with greater than 50% respiratory variability, suggesting right atrial pressure of 3 mmHg. IAS/Shunts: No atrial level shunt detected by color flow Doppler.  LEFT VENTRICLE PLAX 2D LVIDd:         4.90 cm     Diastology LVIDs:         4.20 cm     LV e' medial:    5.22 cm/s LV PW:         0.70 cm     LV E/e' medial:  16.8 LV IVS:        1.00 cm     LV e' lateral:   6.20 cm/s LVOT diam:     2.00 cm     LV E/e' lateral: 14.1 LV SV:         70 LV SV Index:   44 LVOT Area:     3.14 cm  LV Volumes (MOD) LV vol d, MOD A2C: 93.9 ml LV vol d, MOD A4C: 88.2 ml LV vol s, MOD A2C: 49.3 ml LV vol s, MOD A4C: 46.3 ml LV SV MOD A2C:     44.6 ml LV SV MOD A4C:     88.2 ml LV SV MOD BP:      45.4 ml RIGHT VENTRICLE RV Basal diam:  2.60 cm RV Mid diam:    2.00 cm RV S prime:     13.60 cm/s LEFT ATRIUM             Index        RIGHT ATRIUM           Index LA diam:        3.50 cm 2.21 cm/m   RA Area:     12.80 cm LA Vol (A2C):   47.5 ml 29.97 ml/m  RA Volume:   25.90 ml  16.34 ml/m LA Vol (A4C):   34.2 ml 21.58 ml/m LA Biplane Vol: 40.8 ml 25.74 ml/m  AORTIC VALVE AV Area (Vmax):    2.81 cm AV Area (Vmean):   2.68 cm AV Area (VTI):     2.48 cm AV Vmax:  132.00 cm/s AV Vmean:          87.100 cm/s AV VTI:            0.284 m AV Peak Grad:      7.0 mmHg AV Mean Grad:      3.0 mmHg LVOT Vmax:         118.00 cm/s LVOT Vmean:        74.200 cm/s LVOT VTI:          0.224 m LVOT/AV VTI ratio: 0.79  AORTA Ao Root diam: 2.70 cm Ao Asc diam:  2.80 cm MITRAL VALVE MV Area (PHT): 2.92 cm     SHUNTS MV Decel Time: 260 msec     Systemic VTI:  0.22 m MV E velocity: 87.50 cm/s   Systemic Diam: 2.00 cm MV A velocity: 124.00 cm/s MV E/A ratio:  0.71 Georganna Archer Electronically signed by Georganna Archer Signature Date/Time: 06/06/2024/12:48:03 PM    Final    CT Angio Chest PE W and/or Wo Contrast Result Date: 06/06/2024 EXAM: CTA of the Chest with contrast for PE 06/05/2024 11:59:00  PM TECHNIQUE: CTA of the chest was performed after the administration of 75 mL of iohexol  (OMNIPAQUE ) 350 MG/ML injection. Multiplanar reformatted images are provided for review. MIP images are provided for review. Automated exposure control, iterative reconstruction, and/or weight based adjustment of the mA/kV was utilized to reduce the radiation dose to as low as reasonably achievable. COMPARISON: CT cardiac 07/16/2022. CLINICAL HISTORY: FINDINGS: PULMONARY ARTERIES: Pulmonary arteries are adequately opacified for evaluation. No pulmonary embolism. Main pulmonary artery is normal in caliber. MEDIASTINUM: The heart demonstrates mild calcified atherosclerotic disease of the coronary arteries. The pericardium demonstrates no acute abnormality. There is mild calcified atherosclerotic disease of the aorta. There is mild diffuse esophageal wall thickening. LYMPH NODES: No mediastinal, hilar or axillary lymphadenopathy. LUNGS AND PLEURA: Mild emphysema present. A ground glass nodule in the right upper lobe measuring 5 mm (image 6/51) is unchanged from 07/16/2022. There is minimal bibasilar atelectasis. No pleural effusion or pneumothorax. UPPER ABDOMEN: Limited images of the upper abdomen are unremarkable. SOFT TISSUES AND BONES: No acute bone or soft tissue abnormality. IMPRESSION: 1. No evidence of pulmonary embolism. 2. Mild emphysema. 3. Stable 5 mm right upper lobe ground glass nodule. No routine follow-up imaging is recommended as per Fleischner Society Guidelines. Electronically signed by: Greig Pique MD 06/06/2024 12:11 AM EDT RP Workstation: [id number]   CT Head Wo Contrast Result Date: 06/05/2024 EXAM: CT HEAD WITHOUT CONTRAST 06/05/2024 02:25:53 PM TECHNIQUE: CT of the head was performed without the administration of intravenous contrast. Automated exposure control, iterative reconstruction, and/or weight based adjustment of the mA/kV was utilized to reduce the radiation dose to as low as reasonably  achievable. COMPARISON: CT head 10/05/2002 CLINICAL HISTORY: Syncope/presyncope, cerebrovascular cause suspected. FINDINGS: BRAIN AND VENTRICLES: No acute hemorrhage. No evidence of acute infarct. No hydrocephalus. No extra-axial collection. No mass effect or midline shift. ORBITS: No acute abnormality. SINUSES: No acute abnormality. SOFT TISSUES AND SKULL: No acute soft tissue abnormality. No skull fracture. IMPRESSION: 1. No acute intracranial abnormality. Electronically signed by: Gilmore Molt MD 06/05/2024 02:33 PM EDT RP Workstation: [id number]   DG Chest Portable 1 View Result Date: 06/05/2024 CLINICAL DATA:  Chest pain. EXAM: PORTABLE CHEST 1 VIEW COMPARISON:  10/05/2022. FINDINGS: The heart is enlarged and the mediastinal contour is within normal limits. There is atherosclerotic calcification of the aorta. Lung volumes are low. No consolidation, effusion, or pneumothorax is seen. Right shoulder  arthroplasty changes and cervical spinal fusion hardware are unchanged. No acute osseous abnormality. IMPRESSION: No active disease. Electronically Signed   By: Leita Birmingham M.D.   On: 06/05/2024 14:15    Microbiology: Results for orders placed or performed during the hospital encounter of 10/05/22  Resp panel by RT-PCR (RSV, Flu A&B, Covid) Anterior Nasal Swab     Status: None   Collection Time: 10/05/22  1:56 PM   Specimen: Anterior Nasal Swab  Result Value Ref Range Status   SARS Coronavirus 2 by RT PCR NEGATIVE NEGATIVE Final   Influenza A by PCR NEGATIVE NEGATIVE Final   Influenza B by PCR NEGATIVE NEGATIVE Final    Comment: (NOTE) The Xpert Xpress SARS-CoV-2/FLU/RSV plus assay is intended as an aid in the diagnosis of influenza from Nasopharyngeal swab specimens and should not be used as a sole basis for treatment. Nasal washings and aspirates are unacceptable for Xpert Xpress SARS-CoV-2/FLU/RSV testing.  Fact Sheet for Patients: bloggercourse.com  Fact Sheet  for Healthcare Providers: seriousbroker.it  This test is not yet approved or cleared by the [place]  FDA and has been authorized for detection and/or diagnosis of SARS-CoV-2 by FDA under an Emergency Use Authorization (EUA). This EUA will remain in effect (meaning this test can be used) for the duration of the COVID-19 declaration under Section 564(b)(1) of the Act, 21 U.S.C. section 360bbb-3(b)(1), unless the authorization is terminated or revoked.     Resp Syncytial Virus by PCR NEGATIVE NEGATIVE Final    Comment: (NOTE) Fact Sheet for Patients: bloggercourse.com  Fact Sheet for Healthcare Providers: seriousbroker.it  This test is not yet approved or cleared by the [place]  FDA and has been authorized for detection and/or diagnosis of SARS-CoV-2 by FDA under an Emergency Use Authorization (EUA). This EUA will remain in effect (meaning this test can be used) for the duration of the COVID-19 declaration under Section 564(b)(1) of the Act, 21 U.S.C. section 360bbb-3(b)(1), unless the authorization is terminated or revoked.  Performed at [facility], [address], [place], [place] [address]     Labs: CBC: Recent Labs  Lab 06/05/24 1349 06/06/24 0500 06/07/24 0354  WBC 6.7 5.8 5.4  NEUTROABS  --  2.8  --   HGB 14.5 13.6 13.0  HCT 43.9 41.3 39.4  MCV 92.2 92.8 93.4  PLT 237 222 203   Basic Metabolic Panel: Recent Labs  Lab 06/05/24 1442 06/06/24 0500 06/07/24 0354  NA 140 141 139  K 4.0 4.0 4.3  CL 109 108 107  CO2 20* 23 21*  GLUCOSE 97 106* 122*  BUN 12 10 15   CREATININE 0.79 0.79 0.92  CALCIUM  9.0 8.7* 8.7*  MG  --  1.8  --    Liver Function Tests: Recent Labs  Lab 06/06/24 0500  AST 23  ALT 16  ALKPHOS 83  BILITOT 0.3  PROT 6.0*  ALBUMIN 3.1*   CBG: Recent Labs  Lab 06/07/24 0025 06/07/24 0413 06/07/24 0834 06/07/24 1154 06/07/24 1623  GLUCAP  140* 132* 115* 142* 130*    Discharge time spent: greater than 30 minutes.  Signed: Jemal Miskell, DO [facility] 06/08/2024

## 2024-06-12 NOTE — Progress Notes (Unsigned)
 Cardiology Clinic Note   Patient Name: [name] Date of Encounter: 06/14/2024  Primary Care Provider:  Ransom Other, MD Primary Cardiologist:  Lonni Cash, MD  Patient Profile    [name] 78 year old female presents to the clinic today for follow-up evaluation of her coronary artery disease and coronary vasospasms.  Past Medical History    Past Medical History:  Diagnosis Date   Anemia    Anginal pain    last cp was last year   Anxiety    Arthritis    Blood transfusion    Chest pain 02/04/2016   Coronary artery disease    Coronary artery spasm, wth continued episodes of chest pain.  09/26/2013   Coronary vasospasm    Diabetes mellitus without complication (HCC)    type II   GERD (gastroesophageal reflux disease)    Headache    Hiatal hernia    Hypercholesteremia    Hypertension    NSTEMI (non-ST elevated myocardial infarction) (HCC) not sure   NSVT (nonsustained ventricular tachycardia) (HCC) 09/26/2013   Panic attack    Past Surgical History:  Procedure Laterality Date   ABDOMINAL HYSTERECTOMY     PARTIAL HYSTERECTOMY   ACDF N/A    C5-6 ACDF Dr. Alix   breast     right, tumor benign   BREAST EXCISIONAL BIOPSY Right    1961 (age 75) benign   CARDIAC CATHETERIZATION  2014   CARDIAC CATHETERIZATION  2012   CARDIAC CATHETERIZATION N/A 07/22/2016   Procedure: Left Heart Cath and Coronary Angiography;  Surgeon: Victory LELON Sharps, MD;  Location: [facility] INVASIVE CV LAB;  Service: Cardiovascular;  Laterality: N/A;   cardiac stent  2012   to RCA   CHOLECYSTECTOMY N/A 10/02/2017   Procedure: LAPAROSCOPIC CHOLECYSTECTOMY;  Surgeon: Rubin Calamity, MD;  Location: [facility] OR;  Service: General;  Laterality: N/A;   CORONARY ANGIOPLASTY     ESOPHAGEAL MANOMETRY N/A 10/29/2016   Procedure: ESOPHAGEAL MANOMETRY (EM);  Surgeon: Gladis MARLA Louder, MD;  Location: [facility] ENDOSCOPY;  Service: Endoscopy;  Laterality: N/A;   ESOPHAGOGASTRODUODENOSCOPY (EGD) WITH  PROPOFOL  N/A 10/28/2016   Procedure: ESOPHAGOGASTRODUODENOSCOPY (EGD) WITH PROPOFOL ;  Surgeon: Gladis MARLA Louder, MD;  Location: [facility] ENDOSCOPY;  Service: Endoscopy;  Laterality: N/A;   EXCISION OF SKIN TAG N/A 07/29/2017   Procedure: EXCISION OF PERIANAL  SKIN TAG;  Surgeon: Rubin Calamity, MD;  Location: [facility] OR;  Service: General;  Laterality: N/A;   KNEE SURGERY  left   left ear surgery     for bad cut   LEFT HEART CATH AND CORONARY ANGIOGRAPHY N/A 10/06/2022   Procedure: LEFT HEART CATH AND CORONARY ANGIOGRAPHY;  Surgeon: Court Dorn PARAS, MD;  Location: [facility] INVASIVE CV LAB;  Service: Cardiovascular;  Laterality: N/A;   LEFT HEART CATHETERIZATION WITH CORONARY ANGIOGRAM N/A 02/22/2013   Procedure: LEFT HEART CATHETERIZATION WITH CORONARY ANGIOGRAM;  Surgeon: Victory LELON Sharps DOUGLAS, MD;  Location: [facility] CATH LAB;  Service: Cardiovascular;  Laterality: N/A;   REVERSE SHOULDER ARTHROPLASTY Right 04/07/2022   Procedure: RIGHT SHOULDER REVERSE SHOULDER ARTHROPLASTY;  Surgeon: Genelle Standing, MD;  Location: [facility] OR;  Service: Orthopedics;  Laterality: Right;   SHOULDER SURGERY Bilateral    rotator cuff   TOE SURGERY Left    toe surgery    Allergies  No Known Allergies  History of Present Illness    [name] has a PMH of coronary artery disease with remote PCI of her RCA, coronary vasospasm, 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  She was admitted on 06/05/2024 for evaluation of syncope and chest discomfort.  She noted an episode of syncope and chest pain while singing in church.  Her EKG showed sinus rhythm with no acute ischemic changes.  Her high-sensitivity troponins were minimal at 07-04-19 and 22.  She had similar event 2/24.  Cardiac catheterization at that time showed patent stent in her RCA and otherwise minimal coronary disease.  She was initially started on IV heparin  and then her heparin  was discontinued.  She was continued on amlodipine ,  Imdur , aspirin  and statin therapy.  Her chest wall was tender to palpation along left anterior chest.  It was felt that her discomfort was related to musculoskeletal pain.  Her syncopal episode was felt to be related to a vasovagal event.  Her echocardiogram 06/06/2024 showed LVEF of 45%, akinetic lateral wall, hypokinesis of anterior wall, septal and inferior wall.  Her blood pressure was well-controlled.  She presents to the clinic today for follow-up evaluation and states she has not had any further episodes of presyncope or syncope.  We reviewed her prior episode and emergency department visit.  We reviewed her lab work and echocardiogram.  She expressed understanding.  She notes that she had an episode prior to this in 2014.  It appears that her episode was related to vasovagal episode.  She may return to the gym.  Continue heart healthy low-sodium diet.  I will continue her medication regimen.  Patient was reassured.  Today she denies chest pain, shortness of breath, lower extremity edema, fatigue, palpitations, melena, hematuria, hemoptysis, diaphoresis, weakness, presyncope, syncope, orthopnea, and PND.    Home Medications    Prior to Admission medications   Medication Sig Start Date End Date Taking? Authorizing Provider  ACCU-CHEK GUIDE test strip USE TO CHECK BLOOD SUGAR DAILY 11/05/20   [provider]  Accu-Chek Softclix Lancets lancets daily. 11/05/20   [provider]  amLODipine  (NORVASC ) 10 MG tablet Take 1 tablet (10 mg total) by mouth daily. 06/08/24   Swayze, Ava, DO  aspirin  EC 81 MG tablet Take 81 mg by mouth daily. Swallow whole.    [provider]  gabapentin  (NEURONTIN ) 300 MG capsule Take 300 mg by mouth 2 (two) times daily. 05/17/24   [provider]  isosorbide  mononitrate (IMDUR ) 30 MG 24 hr tablet Take 1 tablet (30 mg total) by mouth daily. 05/31/24   Verlin Lonni BIRCH, MD  meloxicam  (MOBIC ) 15 MG tablet TAKE 1 TABLET(15 MG) BY  MOUTH DAILY 07/29/23   Genelle Standing, MD  metFORMIN (GLUCOPHAGE-XR) 500 MG 24 hr tablet Take 500 mg by mouth 2 (two) times daily. 06/28/20   [provider]  nitroGLYCERIN  (NITROSTAT ) 0.4 MG SL tablet PLACE 1 TABLET UNDER TONGUE AS NEEDED FOR CHEST PAIN EVERY 5 MINUTES UP TO 3 DOSES THEN SEEK MEDICAL ATTENTION. Please make overdue appt. 1st attempt 02/18/19   Claudene Victory ORN, MD  pantoprazole  (PROTONIX ) 40 MG tablet Take 1 tablet (40 mg total) by mouth 2 (two) times daily. 04/20/14   Marylu Leita SAUNDERS, NP  rosuvastatin  (CRESTOR ) 5 MG tablet Take 1 tablet (5 mg total) by mouth daily at 6 PM. 01/24/17   Sebastian Toribio GAILS, MD  traZODone  (DESYREL ) 100 MG tablet Take 100 mg by mouth at bedtime. 04/19/24   [provider]  varenicline (CHANTIX) 0.5 MG tablet Take 0.5 mg by mouth 2 (two) times daily. 04/19/24   [provider]  Vitamin D , Ergocalciferol , (DRISDOL) 1.25  MG (50000 UNIT) CAPS capsule Take 50,000 Units by mouth once a week. Sundays 04/20/24   [provider]    Family History    Family History  Problem Relation Age of Onset   Heart failure Mother    Stroke Sister    Heart attack Other    She indicated that her mother is deceased. She indicated that her father is deceased. She indicated that the status of her sister is unknown. She indicated that her maternal grandmother is deceased. She indicated that her maternal grandfather is deceased. She indicated that her paternal grandmother is deceased. She indicated that her paternal grandfather is deceased. She indicated that the status of her other is unknown.  Social History    Social History   Socioeconomic History   Marital status: Married    Spouse name: Keven   Number of children: 0   Years of education: Not on file   Highest education level: Not on file  Occupational History   Not on file  Tobacco Use   Smoking status: Every Day    Current packs/day: 0.00    Average packs/day: 0.3 packs/day for  40.0 years (10.0 ttl pk-yrs)    Types: Cigarettes    Start date: 01/10/1976    Last attempt to quit: 01/10/2016    Years since quitting: 8.4   Smokeless tobacco: Never   Tobacco comments:    Pt reports she smokes a pack or less per week of cigarettes  Vaping Use   Vaping status: Never Used  Substance and Sexual Activity   Alcohol use: Yes    Alcohol/week: 1.0 standard drink of alcohol    Types: 1 Cans of beer per week    Comment: occasional beer   Drug use: No   Sexual activity: Never  Other Topics Concern   Not on file  Social History Narrative   Lives with husband. Ambulates independently.   Social Drivers of Corporate Investment Banker Strain: Not on file  Food Insecurity: No Food Insecurity (06/06/2024)   Hunger Vital Sign    Worried About Running Out of Food in the Last Year: Never true    Ran Out of Food in the Last Year: Never true  Transportation Needs: No Transportation Needs (06/06/2024)   PRAPARE - Administrator, Civil Service (Medical): No    Lack of Transportation (Non-Medical): No  Physical Activity: Not on file  Stress: Not on file  Social Connections: Socially Integrated (06/06/2024)   Social Connection and Isolation Panel    Frequency of Communication with Friends and Family: More than three times a week    Frequency of Social Gatherings with Friends and Family: Once a week    Attends Religious Services: More than 4 times per year    Active Member of [employer] or Organizations: Yes    Attends Engineer, Structural: More than 4 times per year    Marital Status: Married  Catering Manager Violence: Not At Risk (06/06/2024)   Humiliation, Afraid, Rape, and Kick questionnaire    Fear of Current or Ex-Partner: No    Emotionally Abused: No    Physically Abused: No    Sexually Abused: No     Review of Systems    General:  No chills, fever, night sweats or weight changes.  Cardiovascular:  No chest pain, dyspnea on exertion, edema, orthopnea,  palpitations, paroxysmal nocturnal dyspnea. Dermatological: No rash, lesions/masses Respiratory: No cough, dyspnea Urologic: No hematuria, dysuria Abdominal:   No  nausea, vomiting, diarrhea, bright red blood per rectum, melena, or hematemesis Neurologic:  No visual changes, wkns, changes in mental status. All other systems reviewed and are otherwise negative except as noted above.  Physical Exam    VS:  BP (!) 100/54   Pulse 81   Ht 4' 11 (1.499 m)   Wt 140 lb (63.5 kg)   SpO2 93%   BMI 28.28 kg/m  , BMI Body mass index is 28.28 kg/m. GEN: Well nourished, well developed, in no acute distress. HEENT: normal. Neck: Supple, no JVD, carotid bruits, or masses. Cardiac: RRR, no murmurs, rubs, or gallops. No clubbing, cyanosis, edema.  Radials/DP/PT 2+ and equal bilaterally.  Respiratory:  Respirations regular and unlabored, clear to auscultation bilaterally. GI: Soft, nontender, nondistended, BS + x 4. MS: no deformity or atrophy. Skin: warm and dry, no rash. Neuro:  Strength and sensation are intact. Psych: Normal affect.  Accessory Clinical Findings    Recent Labs: 06/06/2024: ALT 16; Magnesium  1.8; TSH 3.046 06/07/2024: BUN 15; Creatinine, Ser 0.92; Hemoglobin 13.0; Platelets 203; Potassium 4.3; Sodium 139   Recent Lipid Panel    Component Value Date/Time   CHOL 118 06/06/2024 0500   CHOL 176 11/20/2022 0839   TRIG 43 06/06/2024 0500   HDL 71 06/06/2024 0500   HDL 97 11/20/2022 0839   CHOLHDL 1.7 06/06/2024 0500   VLDL 9 06/06/2024 0500   LDLCALC 38 06/06/2024 0500   LDLCALC 65 11/20/2022 0839         ECG personally reviewed by me today-none today.     Echocardiogram 06/06/2024  IMPRESSIONS     1. Left ventricular ejection fraction, by estimation, is 45%. The left  ventricle has mildly decreased function. The left ventricle demonstrates  regional wall motion abnormalities (see scoring diagram/findings for  description). The left ventricular  internal  cavity size was moderately to severely dilated. Left ventricular  diastolic parameters are consistent with Grade I diastolic dysfunction  (impaired relaxation).   2. Right ventricular systolic function is normal. The right ventricular  size is normal. Tricuspid regurgitation signal is inadequate for assessing  PA pressure.   3. The mitral valve is normal in structure. Mild mitral valve  regurgitation. No evidence of mitral stenosis.   4. The aortic valve is normal in structure. Aortic valve regurgitation is  not visualized. No aortic stenosis is present.   5. The inferior vena cava is normal in size with greater than 50%  respiratory variability, suggesting right atrial pressure of 3 mmHg.   Comparison(s): No significant change from prior study.   FINDINGS   Left Ventricle: Left ventricular ejection fraction, by estimation, is  45%. The left ventricle has mildly decreased function. The left ventricle  demonstrates regional wall motion abnormalities. Definity contrast agent  was given IV to delineate the left  ventricular endocardial borders. The left ventricular internal cavity size  was moderately to severely dilated. There is no left ventricular  hypertrophy. Left ventricular diastolic parameters are consistent with  Grade I diastolic dysfunction (impaired  relaxation).     LV Wall Scoring:  The entire lateral wall is akinetic. The entire anterior wall, entire  septum,  entire inferior wall, and apex are hypokinetic.   Right Ventricle: The right ventricular size is normal. No increase in  right ventricular wall thickness. Right ventricular systolic function is  normal. Tricuspid regurgitation signal is inadequate for assessing PA  pressure.   Left Atrium: Left atrial size was normal in size.   Right Atrium:  Right atrial size was normal in size.   Pericardium: There is no evidence of pericardial effusion.   Mitral Valve: The mitral valve is normal in structure. Mild mitral  valve  regurgitation. No evidence of mitral valve stenosis.   Tricuspid Valve: The tricuspid valve is normal in structure. Tricuspid  valve regurgitation is trivial. No evidence of tricuspid stenosis.   Aortic Valve: The aortic valve is normal in structure. Aortic valve  regurgitation is not visualized. No aortic stenosis is present. Aortic  valve mean gradient measures 3.0 mmHg. Aortic valve peak gradient measures  7.0 mmHg. Aortic valve area, by VTI  measures 2.48 cm.   Pulmonic Valve: The pulmonic valve was normal in structure. Pulmonic valve  regurgitation is trivial. No evidence of pulmonic stenosis.   Aorta: The aortic root and ascending aorta are structurally normal, with  no evidence of dilitation.   Venous: The inferior vena cava is normal in size with greater than 50%  respiratory variability, suggesting right atrial pressure of 3 mmHg.   IAS/Shunts: No atrial level shunt detected by color flow Doppler.      Assessment & Plan   1.  Chest discomfort, coronary vasospasms-no chest pain today.  Recently seen and evaluated in the hospital on 06/05/2024 and was discharged on 06/07/2024.  She was initially started on heparin .  Cardiac troponins were low and flat.  She was noted to have chest wall pain. No plans for ischemic evaluation Continue current medical therapy Patient reassured  Syncopal event-denies further episodes.  Previously had neurocardiogenic syncope.  She previously wore a cardiac event monitor in 2024 which showed 1 short run of NSVT and 3 short runs of SVT.  She had a syncopal event in church.  It was preceded by lightheadedness dizziness and feeling clammy.  It was felt to be vasovagal in nature. Will defer cardiac event monitor at this time. Maintain p.o. hydration  HFmrEF-BP today 100/54.  No increased DOE or activity intolerance.  Echocardiogram 06/06/2024 showed stable LVEF. Heart healthy low-sodium diet Increase physical activity as  tolerated Continue Imdur , amlodipine   Hyperlipidemia-LDL 38 on 06/06/24. High-fiber diet Continue aspirin , rosuvastatin   Essential hypertension-BP well controlled at home.  Maintain blood pressure log Salty 6 diet sheet Amlodipine , Imdur   Disposition: Follow-up with Dr. Verlin or me in 4-6 months.   Josefa HERO. Aleicia Kenagy NP-C     06/14/2024, 1:56 PM [facility] [address] [place], [place] [address] Office [phone]    Notice: This dictation was prepared with Dragon dictation along with smaller phrase technology. Any transcriptional errors that result from this process are unintentional and may not be corrected upon review.   I spent 14 minutes examining this patient, reviewing medications, and using patient centered shared decision making involving their cardiac care.   I spent  20 minutes reviewing past medical history,  medications, and prior cardiac tests.

## 2024-06-14 ENCOUNTER — Ambulatory Visit: Attending: General Practice | Admitting: General Practice

## 2024-06-14 ENCOUNTER — Encounter: Payer: Self-pay | Admitting: General Practice

## 2024-06-14 VITALS — BP 100/54 | HR 81 | Ht 59.0 in | Wt 140.0 lb

## 2024-06-14 DIAGNOSIS — R0789 Other chest pain: Secondary | ICD-10-CM

## 2024-06-14 DIAGNOSIS — I5020 Unspecified systolic (congestive) heart failure: Secondary | ICD-10-CM

## 2024-06-14 DIAGNOSIS — I10 Essential (primary) hypertension: Secondary | ICD-10-CM

## 2024-06-14 DIAGNOSIS — E785 Hyperlipidemia, unspecified: Secondary | ICD-10-CM | POA: Diagnosis not present

## 2024-06-14 DIAGNOSIS — R55 Syncope and collapse: Secondary | ICD-10-CM | POA: Diagnosis not present

## 2024-06-14 NOTE — Patient Instructions (Addendum)
 Medication Instructions:  None *If you need a refill on your cardiac medications before your next appointment, please call your pharmacy*  Lab Work: None If you have labs (blood work) drawn today and your tests are completely normal, you will receive your results only by: MyChart Message (if you have MyChart) OR A paper copy in the mail If you have any lab test that is abnormal or we need to change your treatment, we will call you to review the results.  Testing/Procedures: None  Follow-Up: At [facility], you and your health needs are our priority.  As part of our continuing mission to provide you with exceptional heart care, our providers are all part of one team.  This team includes your primary Cardiologist (physician) and Advanced Practice Providers or APPs (Physician Assistants and Nurse Practitioners) who all work together to provide you with the care you need, when you need it.  Your next appointment:   4-6 months month(s)  Provider:   Josefa Beauvais, NP   Other Instructions Ok to return to the gym and avoid activities that require straining.

## 2024-07-26 ENCOUNTER — Ambulatory Visit: Admitting: Cardiovascular Disease

## 2024-08-23 ENCOUNTER — Other Ambulatory Visit: Payer: Self-pay | Admitting: Internal Medicine

## 2024-08-23 DIAGNOSIS — Z1231 Encounter for screening mammogram for malignant neoplasm of breast: Secondary | ICD-10-CM

## 2024-09-09 ENCOUNTER — Ambulatory Visit
Admission: RE | Admit: 2024-09-09 | Discharge: 2024-09-09 | Disposition: A | Source: Ambulatory Visit | Attending: Internal Medicine | Admitting: Internal Medicine

## 2024-09-09 DIAGNOSIS — Z1231 Encounter for screening mammogram for malignant neoplasm of breast: Secondary | ICD-10-CM
